# Patient Record
Sex: Female | Born: 1965
Health system: Southern US, Community
[De-identification: ages and names within clinical notes are randomized; demographics above are authoritative.]

## PROBLEM LIST (undated history)

## (undated) DIAGNOSIS — J189 Pneumonia, unspecified organism: Secondary | ICD-10-CM

## (undated) DIAGNOSIS — I10 Essential (primary) hypertension: Secondary | ICD-10-CM

## (undated) DIAGNOSIS — J449 Chronic obstructive pulmonary disease, unspecified: Secondary | ICD-10-CM

## (undated) DIAGNOSIS — E785 Hyperlipidemia, unspecified: Secondary | ICD-10-CM

## (undated) DIAGNOSIS — E119 Type 2 diabetes mellitus without complications: Secondary | ICD-10-CM

## (undated) DIAGNOSIS — I2510 Atherosclerotic heart disease of native coronary artery without angina pectoris: Secondary | ICD-10-CM

## (undated) DIAGNOSIS — K859 Acute pancreatitis without necrosis or infection, unspecified: Secondary | ICD-10-CM

## (undated) HISTORY — PX: TONSILLECTOMY: SUR1361

## (undated) HISTORY — DX: Pneumonia, unspecified organism: J18.9

## (undated) HISTORY — PX: ABDOMINAL HYSTERECTOMY: SHX81

## (undated) HISTORY — PX: OTHER SURGICAL HISTORY: SHX169

## (undated) HISTORY — DX: Type 2 diabetes mellitus without complications: E11.9

## (undated) HISTORY — DX: Essential (primary) hypertension: I10

## (undated) HISTORY — DX: Atherosclerotic heart disease of native coronary artery without angina pectoris: I25.10

## (undated) HISTORY — DX: Acute pancreatitis without necrosis or infection, unspecified: K85.90

## (undated) HISTORY — PX: ABDOMINAL HERNIA REPAIR: SHX539

## (undated) HISTORY — PX: KNEE SURGERY: SHX244

## (undated) HISTORY — PX: NOSE SURGERY: SHX723

---

## 2004-02-23 HISTORY — PX: CORONARY ANGIOPLASTY WITH STENT PLACEMENT: SHX49

## 2007-07-03 ENCOUNTER — Emergency Department (HOSPITAL_COMMUNITY): Admission: EM | Admit: 2007-07-03 | Discharge: 2007-07-04 | Payer: Self-pay | Admitting: Emergency Medicine

## 2007-07-21 ENCOUNTER — Emergency Department (HOSPITAL_COMMUNITY): Admission: EM | Admit: 2007-07-21 | Discharge: 2007-07-22 | Payer: Self-pay | Admitting: Emergency Medicine

## 2007-09-19 ENCOUNTER — Emergency Department (HOSPITAL_COMMUNITY): Admission: EM | Admit: 2007-09-19 | Discharge: 2007-09-19 | Payer: Self-pay | Admitting: Emergency Medicine

## 2007-09-20 ENCOUNTER — Emergency Department (HOSPITAL_COMMUNITY): Admission: EM | Admit: 2007-09-20 | Discharge: 2007-09-20 | Payer: Self-pay | Admitting: Emergency Medicine

## 2007-11-29 ENCOUNTER — Emergency Department (HOSPITAL_COMMUNITY): Admission: EM | Admit: 2007-11-29 | Discharge: 2007-11-29 | Payer: Self-pay | Admitting: Emergency Medicine

## 2008-01-09 ENCOUNTER — Ambulatory Visit (HOSPITAL_COMMUNITY): Admission: RE | Admit: 2008-01-09 | Discharge: 2008-01-09 | Payer: Self-pay | Admitting: Pediatrics

## 2008-01-12 ENCOUNTER — Ambulatory Visit: Payer: Self-pay | Admitting: Cardiology

## 2008-01-22 ENCOUNTER — Emergency Department (HOSPITAL_COMMUNITY): Admission: EM | Admit: 2008-01-22 | Discharge: 2008-01-22 | Payer: Self-pay | Admitting: Emergency Medicine

## 2008-04-24 ENCOUNTER — Encounter (INDEPENDENT_AMBULATORY_CARE_PROVIDER_SITE_OTHER): Payer: Self-pay | Admitting: *Deleted

## 2009-09-26 ENCOUNTER — Ambulatory Visit (HOSPITAL_COMMUNITY): Admission: RE | Admit: 2009-09-26 | Discharge: 2009-09-26 | Payer: Self-pay | Admitting: Gastroenterology

## 2009-11-07 ENCOUNTER — Emergency Department (HOSPITAL_COMMUNITY): Admission: EM | Admit: 2009-11-07 | Discharge: 2009-11-07 | Payer: Self-pay | Admitting: Emergency Medicine

## 2009-11-25 ENCOUNTER — Emergency Department (HOSPITAL_COMMUNITY): Admission: EM | Admit: 2009-11-25 | Discharge: 2009-11-26 | Payer: Self-pay | Admitting: Emergency Medicine

## 2009-12-25 ENCOUNTER — Ambulatory Visit: Payer: Self-pay | Admitting: Family Medicine

## 2010-03-15 ENCOUNTER — Encounter: Payer: Self-pay | Admitting: Cardiology

## 2010-05-07 LAB — COMPREHENSIVE METABOLIC PANEL
ALT: 18 U/L (ref 0–35)
AST: 25 U/L (ref 0–37)
Albumin: 3.5 g/dL (ref 3.5–5.2)
Alkaline Phosphatase: 62 U/L (ref 39–117)
BUN: 13 mg/dL (ref 6–23)
CO2: 31 mEq/L (ref 19–32)
Calcium: 9.3 mg/dL (ref 8.4–10.5)
Chloride: 101 mEq/L (ref 96–112)
Creatinine, Ser: 0.55 mg/dL (ref 0.4–1.2)
GFR calc Af Amer: >60 mL/min (ref >60)
GFR calc non Af Amer: >60 mL/min (ref >60)
Glucose, Bld: 108 mg/dL — ABNORMAL HIGH (ref 70–99)
Potassium: 3.6 mEq/L (ref 3.5–5.1)
Sodium: 139 mEq/L (ref 135–145)
Total Bilirubin: 0.3 mg/dL (ref 0.3–1.2)
Total Protein: 7 g/dL (ref 6.0–8.3)

## 2010-05-07 LAB — URINE MICROSCOPIC-ADD ON

## 2010-05-07 LAB — URINALYSIS, ROUTINE W REFLEX MICROSCOPIC
Bilirubin Urine: NEGATIVE
Glucose, UA: NEGATIVE mg/dL
Hgb urine dipstick: NEGATIVE
Ketones, ur: NEGATIVE mg/dL
Leukocytes, UA: NEGATIVE
Nitrite: NEGATIVE
Protein, ur: 100 mg/dL — AB
Specific Gravity, Urine: 1.025 (ref 1.005–1.030)
Urobilinogen, UA: 0.2 mg/dL (ref 0.0–1.0)
pH: 5.5 (ref 5.0–8.0)

## 2010-05-07 LAB — DIFFERENTIAL
Basophils Absolute: 0 10*3/uL (ref 0.0–0.1)
Basophils Relative: 0 % (ref 0–1)
Eosinophils Absolute: 0.2 10*3/uL (ref 0.0–0.7)
Eosinophils Relative: 1 % (ref 0–5)
Lymphocytes Relative: 18 % (ref 12–46)
Lymphs Abs: 2.9 10*3/uL (ref 0.7–4.0)
Monocytes Absolute: 1.1 10*3/uL — ABNORMAL HIGH (ref 0.1–1.0)
Monocytes Relative: 7 % (ref 3–12)
Neutro Abs: 11.4 10*3/uL — ABNORMAL HIGH (ref 1.7–7.7)
Neutrophils Relative %: 73 % (ref 43–77)

## 2010-05-07 LAB — CBC
HCT: 37.7 % (ref 36.0–46.0)
Hemoglobin: 12.8 g/dL (ref 12.0–15.0)
MCH: 29.8 pg (ref 26.0–34.0)
MCHC: 34 g/dL (ref 30.0–36.0)
MCV: 87.7 fL (ref 78.0–100.0)
Platelets: 320 10*3/uL (ref 150–400)
RBC: 4.3 MIL/uL (ref 3.87–5.11)
RDW: 14.6 % (ref 11.5–15.5)
WBC: 15.6 10*3/uL — ABNORMAL HIGH (ref 4.0–10.5)

## 2010-05-07 LAB — LIPASE, BLOOD: Lipase: 29 U/L (ref 11–59)

## 2010-05-08 LAB — GLUCOSE, CAPILLARY: Glucose-Capillary: 249 mg/dL — ABNORMAL HIGH (ref 70–99)

## 2010-07-07 NOTE — Letter (Signed)
January 12, 2008    Francoise Schaumann. Milford Cage DO, FAAP  [facility]  [address]  [place], [place] [address]   RE:  CATTALEYA, WIEN  MRN:  [id number]  /  DOB:  [birth year]   Dear Viviann Spare:   It was my pleasure consulting in the care of this nice woman who you  follow with multiple medical problems.  As you know, she reports stent  implantation in the right coronary artery in 2006 in [place].  She  subsequently did well until 2009, when she underwent catheterization for  recurrent chest discomfort.  The stent and all other vessels were  reportedly patent.  She continues to experience exertional chest  discomfort that has the characteristics of an angina.  There is  substernal pressure radiating to the left arm that is relieved with  rest.  Cardiovascular risk factors include obesity, insulin-requiring  diabetes, hypertension, and hyperlipidemia.   PAST SURGICAL HISTORY:  Includes a partial hysterectomy and a  tonsillectomy.   MEDICATIONS:  1. Clopidogrel 75 mg daily.  2. Simvastatin 40 mg daily.  3. Lisinopril 10 mg daily.  4. Furosemide 20 mg b.i.d.  5. Evista 1 tablet daily.  6. Januvia 100 mg daily.  7. NovoLog.  8. Lantus insulin to a total of 200 units per day or so.   ALLERGIES:  She reports inability to take ASPIRIN due to emesis.   SOCIAL HISTORY:  Quit smoking a few years ago after a 30 pack year total  consumption.  Works in home health care.  No excessive use of alcohol.  Separate with 4 children.   FAMILY HISTORY:  Father's cause of death is unknown.  Her mother died  with congestive heart failure and renal failure.  She has 4 siblings,  none of whom have coronary artery disease.   REVIEW OF SYSTEMS:  Notable for need for corrective lenses, occasional  palpitations, intermittent diarrhea, arthritic discomfort in multiple  regions, and occasional pedal edema.   PHYSICAL EXAMINATION:  GENERAL:  Overweight woman, in no acute distress.  The  height is 64 inches, weight 277.  VITAL SIGNS:  Blood pressure 120/70, heart rate 85 and regular, and  respirations 14.  HEENT:  EOMs full; normal lids and conjunctivae; normal oral mucosa.  NECK:  No jugular venous distention; normal carotid upstrokes with left  bruit.  ENDOCRINE:  No thyromegaly.  HEMATOPOIETIN:  No adenopathy.  LUNGS:  Clear.  CARDIAC:  Normal first and second heart sounds; minimal systolic murmur.  ABDOMEN:  Soft and nontender; no organomegaly; normal bowel sounds.  EXTREMITIES:  1-1/2+ ankle edema; distal pulses intact.  NEUROLOGIC:  Symmetric strength and tone; normal cranial nerves.   EKG:  Normal sinus rhythm; extreme right axis.   IMPRESSION:  Ms. Jiles Prows reports the onset of coronary artery disease  at a very young age.  Fortunately, her most recent catheterization  showed no significant obstructive lesions.  The symptoms she describes  could be syndrome X, angina without any apparent significant epicardial  coronary artery disease.  This can be treated with medications.  She  also has suboptimal control of risk factors, most notably her lipids.  We will increase simvastatin to 80 mg daily and fenofibrate.  We will  collect her records of previous cardiology care and plan to see this  nice woman again in 4 months.  If chest discomfort continues, the first  drug I would try is amlodipine.   Thank you so much for sending  this nice woman to see me.    Sincerely,      Gerrit Friends. [name], MD, [facility]  Electronically Signed    RMR/MedQ  DD: 01/12/2008  DT: 01/13/2008  Job #: [id number]

## 2010-08-13 ENCOUNTER — Encounter (HOSPITAL_BASED_OUTPATIENT_CLINIC_OR_DEPARTMENT_OTHER): Payer: Self-pay | Attending: Internal Medicine

## 2010-08-13 DIAGNOSIS — L97509 Non-pressure chronic ulcer of other part of unspecified foot with unspecified severity: Secondary | ICD-10-CM | POA: Insufficient documentation

## 2010-08-13 DIAGNOSIS — Z79899 Other long term (current) drug therapy: Secondary | ICD-10-CM | POA: Insufficient documentation

## 2010-08-13 DIAGNOSIS — E1165 Type 2 diabetes mellitus with hyperglycemia: Secondary | ICD-10-CM | POA: Insufficient documentation

## 2010-08-13 DIAGNOSIS — I10 Essential (primary) hypertension: Secondary | ICD-10-CM | POA: Insufficient documentation

## 2010-08-13 DIAGNOSIS — E785 Hyperlipidemia, unspecified: Secondary | ICD-10-CM | POA: Insufficient documentation

## 2010-08-13 DIAGNOSIS — IMO0002 Reserved for concepts with insufficient information to code with codable children: Secondary | ICD-10-CM | POA: Insufficient documentation

## 2010-08-13 DIAGNOSIS — I517 Cardiomegaly: Secondary | ICD-10-CM | POA: Insufficient documentation

## 2010-08-13 DIAGNOSIS — Z794 Long term (current) use of insulin: Secondary | ICD-10-CM | POA: Insufficient documentation

## 2010-08-14 ENCOUNTER — Other Ambulatory Visit (HOSPITAL_BASED_OUTPATIENT_CLINIC_OR_DEPARTMENT_OTHER): Payer: Self-pay | Admitting: Internal Medicine

## 2010-08-14 ENCOUNTER — Ambulatory Visit (HOSPITAL_COMMUNITY)
Admission: RE | Admit: 2010-08-14 | Discharge: 2010-08-14 | Disposition: A | Discharge status: Home / Self Care | Payer: Self-pay | Source: Ambulatory Visit | Attending: Internal Medicine | Admitting: Internal Medicine

## 2010-08-14 DIAGNOSIS — I517 Cardiomegaly: Secondary | ICD-10-CM | POA: Insufficient documentation

## 2010-08-14 DIAGNOSIS — E1169 Type 2 diabetes mellitus with other specified complication: Secondary | ICD-10-CM | POA: Insufficient documentation

## 2010-08-14 DIAGNOSIS — T148XXA Other injury of unspecified body region, initial encounter: Secondary | ICD-10-CM

## 2010-08-14 DIAGNOSIS — I10 Essential (primary) hypertension: Secondary | ICD-10-CM | POA: Insufficient documentation

## 2010-08-14 DIAGNOSIS — Z79899 Other long term (current) drug therapy: Secondary | ICD-10-CM | POA: Insufficient documentation

## 2010-08-14 DIAGNOSIS — Z794 Long term (current) use of insulin: Secondary | ICD-10-CM | POA: Insufficient documentation

## 2010-08-14 DIAGNOSIS — L97509 Non-pressure chronic ulcer of other part of unspecified foot with unspecified severity: Secondary | ICD-10-CM | POA: Insufficient documentation

## 2010-08-15 NOTE — Assessment & Plan Note (Unsigned)
[facility]  NAME:  Nicole [name], Nicole [name]             ACCOUNT NO.:  [id number]  MEDICAL RECORD NO.:  [id number]      DATE OF BIRTH:  04-03-1965  PHYSICIAN:  Leonie Man, M.D.    VISIT DATE:  08/14/2010                                  OFFICE VISIT   PROBLEMS:  Diabetic foot ulcer of the right plantar foot in this 45 year old female with uncontrolled insulin-dependent diabetes.  The ulcer has been present for the past several weeks.  This was noted after the patient went camping.  She does not recall injuring her foot.  She is not undergoing any specific treatment at this time.  ALLERGIES:  ASPIRIN which causes anaphylaxis.  She is also allergic to substances such as PINEAPPLES and BEE STINGS.  CURRENT MEDICATIONS:  Lisinopril, simvastatin, Lasix, NovoLog, Lantus insulin, and K-Lor.  PAST MEDICAL AND SURGICAL HISTORY:  Insulin-dependent diabetes, hyperlipidemia, hypertension, morbid obesity.  She is status post total abdominal hysterectomy.  She has had arthroscopic knee surgery, tonsillectomy, adenoidectomy, umbilical hernia.  She has had a cardiac stent placed and she has had C-sections on 3 separate occasions.  SOCIAL HISTORY:  Divorced, white English-speaking female.  No tobacco, alcohol, or recreational drug use.  REVIEW OF SYSTEMS:  Negative except as outlined above.  PHYSICAL EXAMINATION:  VITAL SIGNS:  Temperature 98.8, pulse 84, respirations 18, blood pressure 182/78.  Capillary glucose 179. HEAD AND NECK:  Shows head to be normocephalic.  Sclerae are anicteric. No nasal obstruction.  Oropharynx benign. NECK:  Supple.  No thyromegaly.  No palpable adenopathy. LUNGS:  Clear to auscultation. HEART:  Regular rate and rhythm without murmurs. ABDOMEN:  Soft, nontender, nondistended, but morbidly obese.  There are no hernias noted. EXTREMITIES:  Pulses are intact bilaterally.  ABI is 0.9.  She has a plantar ulcer measuring 0.5 x 0.3 x 0.5 with  tunneling at the 9 o'clock axis up to 0.5 cm.  The wound depth is probed and curetted today.  There is no probing to bone.  The wound is then packed with Aquacel.  The pressure is off- loaded with felt donut.  We will consider placing a total contact cast on her in within the next week.  In the meantime, we will continue her on doxycycline 100 mg b.i.d. pending cultures.     Leonie Man, M.D.     PB/MEDQ  D:  08/14/2010  T:  08/15/2010  Job:  [id number]

## 2010-08-28 ENCOUNTER — Encounter (HOSPITAL_BASED_OUTPATIENT_CLINIC_OR_DEPARTMENT_OTHER): Payer: Self-pay | Attending: Internal Medicine

## 2010-08-28 DIAGNOSIS — E1169 Type 2 diabetes mellitus with other specified complication: Secondary | ICD-10-CM | POA: Insufficient documentation

## 2010-08-28 DIAGNOSIS — I10 Essential (primary) hypertension: Secondary | ICD-10-CM | POA: Insufficient documentation

## 2010-08-28 DIAGNOSIS — I517 Cardiomegaly: Secondary | ICD-10-CM | POA: Insufficient documentation

## 2010-08-28 DIAGNOSIS — Z79899 Other long term (current) drug therapy: Secondary | ICD-10-CM | POA: Insufficient documentation

## 2010-08-28 DIAGNOSIS — E1165 Type 2 diabetes mellitus with hyperglycemia: Secondary | ICD-10-CM | POA: Insufficient documentation

## 2010-08-28 DIAGNOSIS — E785 Hyperlipidemia, unspecified: Secondary | ICD-10-CM | POA: Insufficient documentation

## 2010-08-28 DIAGNOSIS — L97509 Non-pressure chronic ulcer of other part of unspecified foot with unspecified severity: Secondary | ICD-10-CM | POA: Insufficient documentation

## 2010-08-28 DIAGNOSIS — Z794 Long term (current) use of insulin: Secondary | ICD-10-CM | POA: Insufficient documentation

## 2010-08-28 DIAGNOSIS — IMO0002 Reserved for concepts with insufficient information to code with codable children: Secondary | ICD-10-CM | POA: Insufficient documentation

## 2010-09-25 ENCOUNTER — Encounter (HOSPITAL_BASED_OUTPATIENT_CLINIC_OR_DEPARTMENT_OTHER): Payer: Self-pay | Attending: General Surgery

## 2010-09-25 DIAGNOSIS — E1169 Type 2 diabetes mellitus with other specified complication: Secondary | ICD-10-CM | POA: Insufficient documentation

## 2010-09-25 DIAGNOSIS — Z794 Long term (current) use of insulin: Secondary | ICD-10-CM | POA: Insufficient documentation

## 2010-09-25 DIAGNOSIS — Z8614 Personal history of Methicillin resistant Staphylococcus aureus infection: Secondary | ICD-10-CM | POA: Insufficient documentation

## 2010-09-25 DIAGNOSIS — Z79899 Other long term (current) drug therapy: Secondary | ICD-10-CM | POA: Insufficient documentation

## 2010-09-25 DIAGNOSIS — L97509 Non-pressure chronic ulcer of other part of unspecified foot with unspecified severity: Secondary | ICD-10-CM | POA: Insufficient documentation

## 2010-09-25 DIAGNOSIS — I10 Essential (primary) hypertension: Secondary | ICD-10-CM | POA: Insufficient documentation

## 2010-10-30 ENCOUNTER — Encounter (HOSPITAL_BASED_OUTPATIENT_CLINIC_OR_DEPARTMENT_OTHER): Payer: Self-pay

## 2010-12-30 ENCOUNTER — Other Ambulatory Visit (HOSPITAL_COMMUNITY): Payer: Self-pay | Admitting: Physician Assistant

## 2010-12-30 DIAGNOSIS — Z139 Encounter for screening, unspecified: Secondary | ICD-10-CM

## 2011-01-05 ENCOUNTER — Ambulatory Visit (HOSPITAL_COMMUNITY): Payer: Self-pay

## 2011-01-11 ENCOUNTER — Ambulatory Visit (HOSPITAL_COMMUNITY): Payer: Self-pay

## 2011-02-18 ENCOUNTER — Encounter: Payer: Self-pay | Admitting: *Deleted

## 2011-02-18 ENCOUNTER — Inpatient Hospital Stay (HOSPITAL_COMMUNITY)
Admission: EM | Admit: 2011-02-18 | Discharge: 2011-02-20 | DRG: 193 | Disposition: A | Discharge status: Home / Self Care | Payer: Self-pay | Attending: Internal Medicine | Admitting: Internal Medicine

## 2011-02-18 ENCOUNTER — Emergency Department (HOSPITAL_COMMUNITY): Payer: Self-pay

## 2011-02-18 ENCOUNTER — Emergency Department (HOSPITAL_COMMUNITY): Admission: EM | Admit: 2011-02-18 | Discharge: 2011-02-18 | Discharge status: Against Medical Advice | Payer: Self-pay

## 2011-02-18 DIAGNOSIS — Z9861 Coronary angioplasty status: Secondary | ICD-10-CM

## 2011-02-18 DIAGNOSIS — J189 Pneumonia, unspecified organism: Secondary | ICD-10-CM | POA: Diagnosis present

## 2011-02-18 DIAGNOSIS — J9600 Acute respiratory failure, unspecified whether with hypoxia or hypercapnia: Secondary | ICD-10-CM

## 2011-02-18 DIAGNOSIS — J441 Chronic obstructive pulmonary disease with (acute) exacerbation: Secondary | ICD-10-CM | POA: Diagnosis present

## 2011-02-18 DIAGNOSIS — Z6841 Body Mass Index (BMI) 40.0 and over, adult: Secondary | ICD-10-CM

## 2011-02-18 DIAGNOSIS — E785 Hyperlipidemia, unspecified: Secondary | ICD-10-CM | POA: Diagnosis present

## 2011-02-18 DIAGNOSIS — Z87891 Personal history of nicotine dependence: Secondary | ICD-10-CM

## 2011-02-18 DIAGNOSIS — IMO0001 Reserved for inherently not codable concepts without codable children: Secondary | ICD-10-CM | POA: Diagnosis present

## 2011-02-18 DIAGNOSIS — I10 Essential (primary) hypertension: Secondary | ICD-10-CM | POA: Diagnosis present

## 2011-02-18 DIAGNOSIS — J13 Pneumonia due to Streptococcus pneumoniae: Principal | ICD-10-CM | POA: Diagnosis present

## 2011-02-18 DIAGNOSIS — E1169 Type 2 diabetes mellitus with other specified complication: Secondary | ICD-10-CM

## 2011-02-18 DIAGNOSIS — I2510 Atherosclerotic heart disease of native coronary artery without angina pectoris: Secondary | ICD-10-CM | POA: Diagnosis present

## 2011-02-18 DIAGNOSIS — E669 Obesity, unspecified: Secondary | ICD-10-CM | POA: Diagnosis present

## 2011-02-18 DIAGNOSIS — I25119 Atherosclerotic heart disease of native coronary artery with unspecified angina pectoris: Secondary | ICD-10-CM | POA: Diagnosis present

## 2011-02-18 DIAGNOSIS — I509 Heart failure, unspecified: Secondary | ICD-10-CM

## 2011-02-18 HISTORY — DX: Hyperlipidemia, unspecified: E78.5

## 2011-02-18 HISTORY — DX: Morbid (severe) obesity due to excess calories: E66.01

## 2011-02-18 HISTORY — DX: Chronic obstructive pulmonary disease, unspecified: J44.9

## 2011-02-18 LAB — GLUCOSE, CAPILLARY: Glucose-Capillary: 127 mg/dL — ABNORMAL HIGH (ref 70–99)

## 2011-02-18 MED ORDER — LEVOFLOXACIN IN D5W 500 MG/100ML IV SOLN
500.0000 mg | Freq: Once | INTRAVENOUS | Status: AC
  Administered 2011-02-19: 500 mg via INTRAVENOUS
  Filled 2011-02-18: qty 100

## 2011-02-18 MED ORDER — SODIUM CHLORIDE 0.9 % IV SOLN
INTRAVENOUS | Status: DC
  Administered 2011-02-19: via INTRAVENOUS

## 2011-02-18 MED ORDER — FUROSEMIDE 10 MG/ML IJ SOLN
40.0000 mg | Freq: Once | INTRAMUSCULAR | Status: AC
  Administered 2011-02-19: 40 mg via INTRAVENOUS
  Filled 2011-02-18: qty 4

## 2011-02-18 MED ORDER — ALBUTEROL SULFATE (5 MG/ML) 0.5% IN NEBU
5.0000 mg | INHALATION_SOLUTION | Freq: Once | RESPIRATORY_TRACT | Status: AC
  Administered 2011-02-19: 5 mg via RESPIRATORY_TRACT
  Filled 2011-02-18: qty 1

## 2011-02-18 NOTE — ED Notes (Signed)
Earache, shortness of breath, diarrhea, nausea and vomitng, fever

## 2011-02-19 ENCOUNTER — Encounter (HOSPITAL_COMMUNITY): Payer: Self-pay | Admitting: Internal Medicine

## 2011-02-19 DIAGNOSIS — E785 Hyperlipidemia, unspecified: Secondary | ICD-10-CM | POA: Diagnosis present

## 2011-02-19 DIAGNOSIS — I10 Essential (primary) hypertension: Secondary | ICD-10-CM | POA: Diagnosis present

## 2011-02-19 DIAGNOSIS — J441 Chronic obstructive pulmonary disease with (acute) exacerbation: Secondary | ICD-10-CM | POA: Diagnosis present

## 2011-02-19 DIAGNOSIS — J189 Pneumonia, unspecified organism: Secondary | ICD-10-CM | POA: Diagnosis present

## 2011-02-19 DIAGNOSIS — J9600 Acute respiratory failure, unspecified whether with hypoxia or hypercapnia: Secondary | ICD-10-CM

## 2011-02-19 DIAGNOSIS — I2510 Atherosclerotic heart disease of native coronary artery without angina pectoris: Secondary | ICD-10-CM | POA: Diagnosis present

## 2011-02-19 DIAGNOSIS — E669 Obesity, unspecified: Secondary | ICD-10-CM | POA: Diagnosis present

## 2011-02-19 DIAGNOSIS — I25119 Atherosclerotic heart disease of native coronary artery with unspecified angina pectoris: Secondary | ICD-10-CM | POA: Diagnosis present

## 2011-02-19 DIAGNOSIS — E1169 Type 2 diabetes mellitus with other specified complication: Secondary | ICD-10-CM | POA: Diagnosis present

## 2011-02-19 DIAGNOSIS — J984 Other disorders of lung: Secondary | ICD-10-CM

## 2011-02-19 LAB — BLOOD GAS, ARTERIAL
Bicarbonate: 25 mEq/L — ABNORMAL HIGH (ref 20.0–24.0)
Drawn by: 22223
O2 Content: 2 L/min
TCO2: 20.3 mmol/L (ref 0–100)
pCO2 arterial: 35.7 mmHg (ref 35.0–45.0)
pH, Arterial: 7.459 — ABNORMAL HIGH (ref 7.350–7.400)
pO2, Arterial: 75.6 mmHg — ABNORMAL LOW (ref 80.0–100.0)

## 2011-02-19 LAB — BASIC METABOLIC PANEL
BUN: 28 mg/dL — ABNORMAL HIGH (ref 6–23)
CO2: 28 mEq/L (ref 19–32)
CO2: 27 mEq/L (ref 19–32)
Calcium: 9 mg/dL (ref 8.4–10.5)
Calcium: 8.8 mg/dL (ref 8.4–10.5)
Chloride: 93 mEq/L — ABNORMAL LOW (ref 96–112)
Glucose, Bld: 132 mg/dL — ABNORMAL HIGH (ref 70–99)
Glucose, Bld: 156 mg/dL — ABNORMAL HIGH (ref 70–99)
Potassium: 3.4 mEq/L — ABNORMAL LOW (ref 3.5–5.1)
Sodium: 133 mEq/L — ABNORMAL LOW (ref 135–145)
Sodium: 133 mEq/L — ABNORMAL LOW (ref 135–145)

## 2011-02-19 LAB — STREP PNEUMONIAE URINARY ANTIGEN: Strep Pneumo Urinary Antigen: POSITIVE — AB

## 2011-02-19 LAB — CBC
HCT: 33.9 % — ABNORMAL LOW (ref 36.0–46.0)
HCT: 34.2 % — ABNORMAL LOW (ref 36.0–46.0)
Hemoglobin: 11.3 g/dL — ABNORMAL LOW (ref 12.0–15.0)
Hemoglobin: 11.4 g/dL — ABNORMAL LOW (ref 12.0–15.0)
MCH: 30 pg (ref 26.0–34.0)
MCH: 29.9 pg (ref 26.0–34.0)
MCHC: 33.3 g/dL (ref 30.0–36.0)
MCV: 89.9 fL (ref 78.0–100.0)
MCV: 89.8 fL (ref 78.0–100.0)
RBC: 3.77 MIL/uL — ABNORMAL LOW (ref 3.87–5.11)
RBC: 3.81 MIL/uL — ABNORMAL LOW (ref 3.87–5.11)
WBC: 21.6 10*3/uL — ABNORMAL HIGH (ref 4.0–10.5)

## 2011-02-19 LAB — PRO B NATRIURETIC PEPTIDE
Pro B Natriuretic peptide (BNP): 173.7 pg/mL — ABNORMAL HIGH (ref 0–125)
Pro B Natriuretic peptide (BNP): 130.6 pg/mL — ABNORMAL HIGH (ref 0–125)

## 2011-02-19 LAB — EXPECTORATED SPUTUM ASSESSMENT W GRAM STAIN, RFLX TO RESP C

## 2011-02-19 LAB — URINE MICROSCOPIC-ADD ON

## 2011-02-19 LAB — URINALYSIS, ROUTINE W REFLEX MICROSCOPIC
Glucose, UA: NEGATIVE mg/dL
Hgb urine dipstick: NEGATIVE
Protein, ur: 100 mg/dL — AB

## 2011-02-19 LAB — HIV ANTIBODY (ROUTINE TESTING W REFLEX): HIV: NONREACTIVE

## 2011-02-19 LAB — GLUCOSE, CAPILLARY
Glucose-Capillary: 293 mg/dL — ABNORMAL HIGH (ref 70–99)
Glucose-Capillary: 338 mg/dL — ABNORMAL HIGH (ref 70–99)

## 2011-02-19 LAB — INFLUENZA PANEL BY PCR (TYPE A & B): Influenza A By PCR: NEGATIVE

## 2011-02-19 MED ORDER — INSULIN ASPART 100 UNIT/ML ~~LOC~~ SOLN
0.0000 [IU] | Freq: Every day | SUBCUTANEOUS | Status: DC
  Administered 2011-02-19: 4 [IU] via SUBCUTANEOUS

## 2011-02-19 MED ORDER — ALBUTEROL SULFATE (5 MG/ML) 0.5% IN NEBU
2.5000 mg | INHALATION_SOLUTION | RESPIRATORY_TRACT | Status: DC
  Administered 2011-02-19 – 2011-02-20 (×6): 2.5 mg via RESPIRATORY_TRACT
  Filled 2011-02-19 (×7): qty 0.5

## 2011-02-19 MED ORDER — ROSUVASTATIN CALCIUM 5 MG PO TABS
20.0000 mg | ORAL_TABLET | Freq: Every day | ORAL | Status: DC
  Administered 2011-02-19: 20 mg via ORAL
  Filled 2011-02-19: qty 4

## 2011-02-19 MED ORDER — IPRATROPIUM BROMIDE 0.02 % IN SOLN
0.5000 mg | RESPIRATORY_TRACT | Status: DC
  Administered 2011-02-19 – 2011-02-20 (×6): 0.5 mg via RESPIRATORY_TRACT
  Filled 2011-02-19 (×7): qty 2.5

## 2011-02-19 MED ORDER — FAMOTIDINE 20 MG PO TABS
20.0000 mg | ORAL_TABLET | Freq: Two times a day (BID) | ORAL | Status: DC
  Administered 2011-02-19 – 2011-02-20 (×3): 20 mg via ORAL
  Filled 2011-02-19 (×3): qty 1

## 2011-02-19 MED ORDER — INSULIN GLARGINE 100 UNIT/ML ~~LOC~~ SOLN
65.0000 [IU] | SUBCUTANEOUS | Status: DC
  Administered 2011-02-19 – 2011-02-20 (×2): 65 [IU] via SUBCUTANEOUS
  Filled 2011-02-19: qty 3

## 2011-02-19 MED ORDER — ONDANSETRON HCL 4 MG/2ML IJ SOLN
4.0000 mg | Freq: Four times a day (QID) | INTRAMUSCULAR | Status: DC | PRN
  Administered 2011-02-20 (×2): 4 mg via INTRAVENOUS
  Filled 2011-02-19 (×2): qty 2

## 2011-02-19 MED ORDER — LEVOFLOXACIN IN D5W 750 MG/150ML IV SOLN
INTRAVENOUS | Status: AC
  Filled 2011-02-19: qty 150

## 2011-02-19 MED ORDER — CLOPIDOGREL BISULFATE 75 MG PO TABS
75.0000 mg | ORAL_TABLET | Freq: Every day | ORAL | Status: DC
  Administered 2011-02-19 – 2011-02-20 (×2): 75 mg via ORAL
  Filled 2011-02-19 (×2): qty 1

## 2011-02-19 MED ORDER — SENNOSIDES-DOCUSATE SODIUM 8.6-50 MG PO TABS: 2.0000 | ORAL_TABLET | Freq: Every evening | ORAL | Status: DC | PRN

## 2011-02-19 MED ORDER — INSULIN GLARGINE 100 UNIT/ML ~~LOC~~ SOLN
70.0000 [IU] | Freq: Every day | SUBCUTANEOUS | Status: DC
  Administered 2011-02-19: 70 [IU] via SUBCUTANEOUS

## 2011-02-19 MED ORDER — FUROSEMIDE 40 MG PO TABS
40.0000 mg | ORAL_TABLET | Freq: Every day | ORAL | Status: DC
  Administered 2011-02-19 – 2011-02-20 (×2): 40 mg via ORAL
  Filled 2011-02-19 (×2): qty 1

## 2011-02-19 MED ORDER — DEXTROSE 5 % IV SOLN
1.0000 g | INTRAVENOUS | Status: DC
  Administered 2011-02-19 – 2011-02-20 (×2): 1 g via INTRAVENOUS
  Filled 2011-02-19 (×4): qty 10

## 2011-02-19 MED ORDER — GUAIFENESIN ER 600 MG PO TB12
1200.0000 mg | ORAL_TABLET | Freq: Two times a day (BID) | ORAL | Status: DC
  Administered 2011-02-19 – 2011-02-20 (×3): 1200 mg via ORAL
  Filled 2011-02-19 (×3): qty 2

## 2011-02-19 MED ORDER — LEVOFLOXACIN IN D5W 750 MG/150ML IV SOLN
750.0000 mg | INTRAVENOUS | Status: DC
  Administered 2011-02-19 – 2011-02-20 (×2): 750 mg via INTRAVENOUS
  Filled 2011-02-19 (×4): qty 150

## 2011-02-19 MED ORDER — ENOXAPARIN SODIUM 40 MG/0.4ML ~~LOC~~ SOLN
40.0000 mg | Freq: Every day | SUBCUTANEOUS | Status: DC
  Administered 2011-02-19 – 2011-02-20 (×2): 40 mg via SUBCUTANEOUS
  Filled 2011-02-19 (×3): qty 0.4

## 2011-02-19 MED ORDER — OMEGA-3-ACID ETHYL ESTERS 1 G PO CAPS
2.0000 g | ORAL_CAPSULE | Freq: Two times a day (BID) | ORAL | Status: DC
  Administered 2011-02-19 – 2011-02-20 (×3): 2 g via ORAL
  Filled 2011-02-19 (×2): qty 2
  Filled 2011-02-19: qty 1

## 2011-02-19 MED ORDER — INSULIN ASPART 100 UNIT/ML ~~LOC~~ SOLN
0.0000 [IU] | Freq: Three times a day (TID) | SUBCUTANEOUS | Status: DC
  Administered 2011-02-19 (×2): 3 [IU] via SUBCUTANEOUS
  Administered 2011-02-19 – 2011-02-20 (×2): 8 [IU] via SUBCUTANEOUS
  Filled 2011-02-19: qty 3

## 2011-02-19 MED ORDER — ONDANSETRON 8 MG PO TBDP
8.0000 mg | ORAL_TABLET | Freq: Once | ORAL | Status: AC
Start: 2011-02-19 — End: 2011-02-19
  Administered 2011-02-19: 8 mg via ORAL
  Filled 2011-02-19: qty 1

## 2011-02-19 MED ORDER — DEXTROSE 5 % IV SOLN
INTRAVENOUS | Status: AC
  Filled 2011-02-19: qty 10

## 2011-02-19 NOTE — Progress Notes (Signed)
*  PRELIMINARY RESULTS* Echocardiogram 2D Echocardiogram has been performed.  Conrad [place] 02/19/2011, 9:59 AM

## 2011-02-19 NOTE — ED Provider Notes (Signed)
History     CSN: [id number]  Arrival date & time 02/18/11  2056   First MD Initiated Contact with Patient 02/18/11 2341      Chief Complaint  Patient presents with  . Cough    (Consider location/radiation/quality/duration/timing/severity/associated sxs/prior treatment) HPI  No past medical history on file.  No past surgical history on file.  No family history on file.  History  Substance Use Topics  . Smoking status: Not on file  . Smokeless tobacco: Not on file  . Alcohol Use: Not on file    OB History    Grav Para Term Preterm Abortions TAB SAB Ect Mult Living                  Review of Systems  Allergies  Aspirin; E-mycin; and Tetracyclines & related  Home Medications   Current Outpatient Rx  Name Route Sig Dispense Refill  . LEVETIRACETAM 500 MG PO TABS Oral Take 500 mg by mouth every 12 (twelve) hours.        BP 114/47  Pulse 94  Temp(Src) 98.4 F (36.9 C) (Oral)  Resp 22  Ht 5\' 3"  (1.6 m)  Wt 300 lb (136.079 kg)  BMI 53.14 kg/m2  SpO2 98%  Physical Exam  ED Course  CRITICAL CARE Performed by: Kathie Dike Authorized by: Kathie Dike Total critical care time: 30 minutes Critical care was necessary to treat or prevent imminent or life-threatening deterioration of the following conditions: pneumonia and CHF with hypoxia. Critical care was time spent personally by me on the following activities: development of treatment plan with patient or surrogate, discussions with consultants, evaluation of patient's response to treatment, examination of patient, obtaining history from patient or surrogate, ordering and performing treatments and interventions, ordering and review of laboratory studies, ordering and review of radiographic studies, pulse oximetry and re-evaluation of patient's condition.   (including critical care time)  Labs Reviewed  GLUCOSE, CAPILLARY - Abnormal; Notable for the following:    Glucose-Capillary 127 (*)    All  other components within normal limits  BLOOD GAS, ARTERIAL - Abnormal; Notable for the following:    pH, Arterial 7.459 (*)    pO2, Arterial 75.6 (*)    Bicarbonate 25.0 (*)    All other components within normal limits  PRO B NATRIURETIC PEPTIDE - Abnormal; Notable for the following:    Pro B Natriuretic peptide (BNP) 173.7 (*)    All other components within normal limits  CARDIAC PANEL(CRET KIN+CKTOT+MB+TROPI) - Abnormal; Notable for the following:    Total CK 251 (*)    All other components within normal limits  CBC - Abnormal; Notable for the following:    WBC 25.3 (*) WHITE COUNT CONFIRMED ON SMEAR   RBC 3.77 (*)    Hemoglobin 11.3 (*)    HCT 33.9 (*)    All other components within normal limits  BASIC METABOLIC PANEL - Abnormal; Notable for the following:    Sodium 133 (*)    Chloride 92 (*)    Glucose, Bld 132 (*)    BUN 28 (*)    GFR calc non Af Amer 68 (*)    GFR calc Af Amer 79 (*)    All other components within normal limits  URINALYSIS, ROUTINE W REFLEX MICROSCOPIC - Abnormal; Notable for the following:    Color, Urine AMBER (*) BIOCHEMICALS MAY BE AFFECTED BY COLOR   Bilirubin Urine SMALL (*)    Protein, ur 100 (*)  All other components within normal limits  URINE MICROSCOPIC-ADD ON - Abnormal; Notable for the following:    Squamous Epithelial / LPF MANY (*)    Bacteria, UA FEW (*)    All other components within normal limits   Dg Chest 2 View  02/18/2011  *RADIOLOGY REPORT*  Clinical Data: Fever, cough, congestion and shortness of breath.  CHEST - 2 VIEW  Comparison: Chest radiograph performed 08/14/2010  Findings: The lungs are well-aerated.  Vascular congestion is noted, with bilateral central and bibasilar airspace opacities. This may reflect multifocal pneumonia or possibly edema.  There is no evidence of pleural effusion or pneumothorax.  The heart is borderline enlarged; the mediastinal contour is within normal limits.  No acute osseous abnormalities are  seen.  IMPRESSION: Vascular congestion and borderline cardiomegaly, with bilateral central and bibasilar airspace opacities.  This may reflect multifocal pneumonia or possibly edema.  Original Report Authenticated By: Tonia Ghent, M.D.     Dx: 1. Pneumonia 2. Hypoxia   MDM  I have reviewed nursing notes, vital signs, and all appropriate lab and imaging results for this patient. Patient presented with shortness of breath using accessory muscles. Patient also noted to have rales and rhonchi and wheezes on examination slow to respond to conservative management. Hypoxia noted on arterial blood gas. Pneumonia and suspected congestive failure on chest x-ray. Patient improving after IV Lasix IV antibiotic and C Pap. Patient noted to have significantly elevated white blood cell count. Admission discussed with Dr. Orvan Falconer. Patient seen with me by Dr. Read Drivers. Patient to be admitted to telemetry.       Kathie Dike, [place] 02/19/11 [phone]

## 2011-02-19 NOTE — ED Provider Notes (Signed)
Medical screening examination/treatment/procedure(s) were conducted as a shared visit with non-physician practitioner(s) and myself.  I personally evaluated the patient during the encounter  1:44 AM Patient off BiPAP. Tachypnea but in no distress. Rales in bases bilaterally patient advised of plan for admission.  Hanley Seamen, MD 02/19/11 [phone]

## 2011-02-19 NOTE — Plan of Care (Signed)
Problem: Food- and Nutrition-Related Knowledge Deficit (NB-1.1) Goal: Nutrition education Formal process to instruct or train a patient/client in a skill or to impart knowledge to help patients/clients voluntarily manage or modify food choices and eating behavior to maintain or improve health.  Outcome: Adequate for Discharge Pt provided with Basics of CHO Modified diet guideline. She reports DM dx >20 yr yet is unable to demonstrate correct CHO intake goals. We reviewed consistent carbohydrate intake and label reading and she was provided handouts CHO Counting and Nutrition In The Fast Lane and Tips for Weight Loss. Rec she follow-up for outpatient nutr counseling after d/c for weight loss goals.

## 2011-02-19 NOTE — Progress Notes (Signed)
Subjective:  This 45 year old morbidly obese lady was admitted with symptoms of myalgia, fever, cough productive of yellow sputum and dyspnea. She appears to have a chest infection and, in fact, she is improved today compared to yesterday. Her cough is still productive of yellow sputum. Her echocardiogram did not give good results do to poor quality, likely related to her obesity. Dr. [name], cardiologist who read the echocardiogram, feels that her ejection fraction is probably okay.           Physical Exam: Blood pressure 153/69, pulse 94, temperature 98.3 F (36.8 C), temperature source Oral, resp. rate 24, height 5\' 3"  (1.6 m), weight 136.079 kg (300 lb), SpO2 97.00%. She does look systemically well. There is no increased work of breathing. Lung fields are entirely clear without any evidence of wheezing, crackles or bronchial breathing. She is clinically not in heart failure. Heart sounds are present and normal. Jugular venous pressure not raised. There is no peripheral pitting edema in her legs. She is alert and orientated.   Investigations:  Recent Results (from the past 240 hour(s))  CULTURE, BLOOD (ROUTINE X 2)     Status: Normal (Preliminary result)   Collection Time   02/19/11  5:11 AM      Component Value Range Status Comment   Specimen Description BLOOD RIGHT ANTECUBITAL   Final    Special Requests BOTTLES DRAWN AEROBIC AND ANAEROBIC  2 CC EACH   Final    Culture PENDING   Incomplete    Report Status PENDING   Incomplete   CULTURE, BLOOD (ROUTINE X 2)     Status: Normal (Preliminary result)   Collection Time   02/19/11  5:35 AM      Component Value Range Status Comment   Specimen Description BLOOD RIGHT HAND   Final    Special Requests BOTTLES DRAWN AEROBIC AND ANAEROBIC  2 CC EACH   Final    Culture PENDING   Incomplete    Report Status PENDING   Incomplete   CULTURE, SPUTUM-ASSESSMENT     Status: Normal (Preliminary result)   Collection Time   02/19/11  5:37 AM        Component Value Range Status Comment   Specimen Description SPU   Final    Special Requests NONE   Final    Sputum evaluation     Final    Value: THIS SPECIMEN IS ACCEPTABLE. RESPIRATORY CULTURE REPORT TO FOLLOW.   Report Status PENDING   Incomplete      Basic Metabolic Panel:  [facility] 02/19/11 0512 02/18/11 2343  NA 133* 133*  K 3.4* 3.5  CL 93* 92*  CO2 27 28  GLUCOSE 156* 132*  BUN 32* 28*  CREATININE 1.03 0.98  CALCIUM 8.8 9.0  MG -- --  PHOS -- --       CBC:  [facility] 02/19/11 0512 02/18/11 2343  WBC 21.6* 25.3*  NEUTROABS -- --  HGB 11.4* 11.3*  HCT 34.2* 33.9*  MCV 89.8 89.9  PLT 248 254    Dg Chest 2 View  02/18/2011  *RADIOLOGY REPORT*  Clinical Data: Fever, cough, congestion and shortness of breath.  CHEST - 2 VIEW  Comparison: Chest radiograph performed 08/14/2010  Findings: The lungs are well-aerated.  Vascular congestion is noted, with bilateral central and bibasilar airspace opacities. This may reflect multifocal pneumonia or possibly edema.  There is no evidence of pleural effusion or pneumothorax.  The heart is borderline enlarged; the mediastinal contour is within normal limits.  No  acute osseous abnormalities are seen.  IMPRESSION: Vascular congestion and borderline cardiomegaly, with bilateral central and bibasilar airspace opacities.  This may reflect multifocal pneumonia or possibly edema.  Original Report Authenticated By: Tonia Ghent, M.D.      Medications: I have reviewed the patient's current medications.  Impression: 1. Community-acquired pneumonia, clinically improving. 2. Morbid obesity. 3. Diabetes mellitus. 4. Hypertension. 5. Possible influenza based on patient's clinical symptoms.     Plan: 1. Continue with current intravenous antibiotics. 2. Await influenza test. 3. Possible discharge home tomorrow if she continues to feel well.     LOS: 1 day   [name] Pager [phone]  02/19/2011, 11:20 AM

## 2011-02-19 NOTE — ED Notes (Signed)
Pt complained of nausea while on bipap.  Bipap removed to prevent aspiration of emesis and pt placed on 2 L [place].  Pt denies SOB at this time.  Ivery Quale, PA notified and new orders were given.

## 2011-02-19 NOTE — H&P (Signed)
PCP:  Goes to the [facility]  Chief Complaint:  Progressive shortness of breath for 3-4 days  HPI: Nicole [name] is an 45 y.o. obese Caucasian female.  Works as a Water engineer, diabetes hypertension coronary artery disease status post stenting morbid obesity; history of asthma in childhood; heavy tobacco abuse but discontinued about 6 years ago.  Having cough runny nose difficulty breathing getting worse over the past 3-4 days until he came to the emergency room where she was found to be hypoxic, S. x-ray showed a bilateral lung disease questionable pneumonia questionable heart failure. Denies a history of heart failure; she does take Lasix chronically for leg edema;  Sputum has been white to occasionally bloody she started having loose stools today, and for the past few days has been having lightheadedness with standing.   she has been having progressive weight gain which she attributes to the use of high doses of insulin. She has no recollection of ever being assessed for her Cushing's disease.  She has not been using steroids.  He was prescribed a nebulizers but uses her husband's home nebulizer she does not have her own nebulizer.  Rewiew of Systems:  The patient denies anorexia, , weight loss,, vision loss, decreased hearing, hoarseness, chest pain, syncope, , balance deficits, hemoptysis, abdominal pain, melena, hematochezia, severe indigestion/heartburn, hematuria, incontinence, genital sores, muscle weakness, suspicious skin lesions, transient blindness, difficulty walking, depression, unusual weight change, abnormal bleeding, enlarged lymph nodes, angioedema, and breast masses.    Past Medical History  Diagnosis Date  . HTN (hypertension), benign   . Diabetes mellitus type 2 in obese   . Morbid obesity   . Dyslipidemia   . CAD (coronary artery disease)     s/p stenting  . Asthma   . COPD (chronic obstructive pulmonary disease)     No past surgical history  on file.  Medications:  HOME MEDS: Prior to Admission medications   Medication Sig Start Date End Date Taking? Authorizing Provider  albuterol (2.5 MG/3ML) 0.083% NEBU 3 mL, albuterol (5 MG/ML) 0.5% NEBU 0.5 mL Inhale into the lungs.     Yes Historical Provider, MD  albuterol (PROVENTIL) (5 MG/ML) 0.5% nebulizer solution Take 2.5 mg by nebulization as needed.     Yes Historical Provider, MD  albuterol (VENTOLIN HFA) 108 (90 BASE) MCG/ACT inhaler Inhale 2 puffs into the lungs as needed.     Yes Historical Provider, MD  clopidogrel (PLAVIX) 75 MG tablet Take 75 mg by mouth daily.     Yes Historical Provider, MD  fish oil-omega-3 fatty acids 1000 MG capsule Take 2 g by mouth 2 (two) times daily.     Yes Historical Provider, MD  furosemide (LASIX) 40 MG tablet Take 40 mg by mouth daily.     Yes Historical Provider, MD  insulin aspart (NOVOLOG) 100 UNIT/ML injection Inject 50 Units into the skin 3 (three) times daily before meals.     Yes Historical Provider, MD  insulin glargine (LANTUS) 100 UNIT/ML injection Inject 70 Units into the skin at bedtime.     Yes Historical Provider, MD  insulin glargine (LANTUS) 100 UNIT/ML injection Inject 65 Units into the skin every morning.     Yes Historical Provider, MD  simvastatin (ZOCOR) 40 MG tablet Take 80 mg by mouth at bedtime.     Yes Historical Provider, MD     Allergies:  Allergies  Allergen Reactions  . Aspirin   . E-Mycin (Erythromycin Base)   . Tetracyclines &  Related     Social History:   reports that she quit smoking about 6 years ago. Her smoking use included Cigarettes. She has a 30 pack-year smoking history. She does not have any smokeless tobacco history on file. Her alcohol and drug histories not on file.  Family History: Family History  Problem Relation Age of Onset  . Diabetes Mother   . Heart failure Mother   . Hypertension Mother   . Kidney failure Mother      Physical Exam: Filed Vitals:   02/19/11 0055 02/19/11 0059  02/19/11 0138 02/19/11 0213  BP:   114/47 104/44  Pulse:    101  Temp:   98.4 F (36.9 C)   TempSrc:   Oral   Resp:      Height:      Weight:      SpO2: 94% 94% 98% 92%   Blood pressure 104/44, pulse 101, temperature 98.4 F (36.9 C), temperature source Oral, resp. rate 22, height 5\' 3"  (1.6 m), weight 136.079 kg (300 lb), SpO2 92.00%.  GEN: Morbidly obese middle-age Caucasian lady sitting up in the stretcher itachypneic; cooperative with exam PSYCH:  alert and oriented x4; does not appear anxious does not appear depressed; affect is normal HEENT: Mucous membranes pink dry and anicteric; PERRLA; EOM intact; no cervical lymphadenopathy nor thyromegaly or carotid bruit; no JVD; very thick neck  Breasts:: Not examined CHEST WALL: No tenderness CHEST: Prolonged expirations bilateral rhonchi and bilateral crackles at the bases  HEART: Regular rate and rhythm; no murmurs rubs or gallops BACK: No kyphosis or scoliosis; no CVA tenderness ABDOMEN: Massively obese Obese, soft non-tender; no masses, no organomegaly, normal abdominal bowel sounds; no pannus; . Rectal Exam: Not done EXTREMITIES: No bone or joint deformity; age-appropriate arthropathy of the hands and knees; 1+ edema laterally edema;  Genitalia: not examined PULSES: 2+ and symmetric SKIN: Normal hydration no rash or ulceration CNS: Cranial nerves 2-12 grossly intact no focal neurologic deficit   Labs & Imaging Results for orders placed during the hospital encounter of 02/18/11 (from the past 48 hour(s))  BLOOD GAS, ARTERIAL     Status: Abnormal   Collection Time   02/18/11 12:50 AM      Component Value Range Comment   O2 Content 2.0      Delivery systems NASAL CANNULA      pH, Arterial 7.459 (*) 7.350 - 7.400     pCO2 arterial 35.7  35.0 - 45.0 (mmHg)    pO2, Arterial 75.6 (*) 80.0 - 100.0 (mmHg)    Bicarbonate 25.0 (*) 20.0 - 24.0 (mEq/L)    TCO2 20.3  0 - 100 (mmol/L)    Acid-Base Excess 1.5  0.0 - 2.0 (mmol/L)    O2  Saturation 96.1      Collection site LEFT RADIAL      Drawn by [id number]      Sample type ARTERIAL      Allens test (pass/fail) PASS  PASS    GLUCOSE, CAPILLARY     Status: Abnormal   Collection Time   02/18/11  9:53 PM      Component Value Range Comment   Glucose-Capillary 127 (*) 70 - 99 (mg/dL)   URINALYSIS, ROUTINE W REFLEX MICROSCOPIC     Status: Abnormal   Collection Time   02/18/11 11:36 PM      Component Value Range Comment   Color, Urine AMBER (*) YELLOW  BIOCHEMICALS MAY BE AFFECTED BY COLOR   APPearance CLEAR  CLEAR  Specific Gravity, Urine 1.025  1.005 - 1.030     pH 5.5  5.0 - 8.0     Glucose, UA NEGATIVE  NEGATIVE (mg/dL)    Hgb urine dipstick NEGATIVE  NEGATIVE     Bilirubin Urine SMALL (*) NEGATIVE     Ketones, ur NEGATIVE  NEGATIVE (mg/dL)    Protein, ur 161 (*) NEGATIVE (mg/dL)    Urobilinogen, UA 1.0  0.0 - 1.0 (mg/dL)    Nitrite NEGATIVE  NEGATIVE     Leukocytes, UA NEGATIVE  NEGATIVE    URINE MICROSCOPIC-ADD ON     Status: Abnormal   Collection Time   02/18/11 11:36 PM      Component Value Range Comment   Squamous Epithelial / LPF MANY (*) RARE     WBC, UA 3-6  <3 (WBC/hpf)    RBC / HPF 0-2  <3 (RBC/hpf)    Bacteria, UA FEW (*) RARE    PRO B NATRIURETIC PEPTIDE     Status: Abnormal   Collection Time   02/18/11 11:41 PM      Component Value Range Comment   Pro B Natriuretic peptide (BNP) 173.7 (*) 0 - 125 (pg/mL)   CARDIAC PANEL(CRET KIN+CKTOT+MB+TROPI)     Status: Abnormal   Collection Time   02/18/11 11:43 PM      Component Value Range Comment   Total CK 251 (*) 7 - 177 (U/L)    CK, MB 2.3  0.3 - 4.0 (ng/mL)    Troponin I <0.30  <0.30 (ng/mL)    Relative Index 0.9  0.0 - 2.5    CBC     Status: Abnormal   Collection Time   02/18/11 11:43 PM      Component Value Range Comment   WBC 25.3 (*) 4.0 - 10.5 (K/uL) WHITE COUNT CONFIRMED ON SMEAR   RBC 3.77 (*) 3.87 - 5.11 (MIL/uL)    Hemoglobin 11.3 (*) 12.0 - 15.0 (g/dL)    HCT 09.6 (*) 04.5 - 46.0  (%)    MCV 89.9  78.0 - 100.0 (fL)    MCH 30.0  26.0 - 34.0 (pg)    MCHC 33.3  30.0 - 36.0 (g/dL)    RDW 40.9  81.1 - 91.4 (%)    Platelets 254  150 - 400 (K/uL)   BASIC METABOLIC PANEL     Status: Abnormal   Collection Time   02/18/11 11:43 PM      Component Value Range Comment   Sodium 133 (*) 135 - 145 (mEq/L)    Potassium 3.5  3.5 - 5.1 (mEq/L)    Chloride 92 (*) 96 - 112 (mEq/L)    CO2 28  19 - 32 (mEq/L)    Glucose, Bld 132 (*) 70 - 99 (mg/dL)    BUN 28 (*) 6 - 23 (mg/dL)    Creatinine, Ser 7.82  0.50 - 1.10 (mg/dL)    Calcium 9.0  8.4 - 10.5 (mg/dL)    GFR calc non Af Amer 68 (*) >90 (mL/min)    GFR calc Af Amer 79 (*) >90 (mL/min)    Dg Chest 2 View  02/18/2011  *RADIOLOGY REPORT*  Clinical Data: Fever, cough, congestion and shortness of breath.  CHEST - 2 VIEW  Comparison: Chest radiograph performed 08/14/2010  Findings: The lungs are well-aerated.  Vascular congestion is noted, with bilateral central and bibasilar airspace opacities. This may reflect multifocal pneumonia or possibly edema.  There is no evidence of pleural effusion or pneumothorax.  The heart is  borderline enlarged; the mediastinal contour is within normal limits.  No acute osseous abnormalities are seen.  IMPRESSION: Vascular congestion and borderline cardiomegaly, with bilateral central and bibasilar airspace opacities.  This may reflect multifocal pneumonia or possibly edema.  Original Report Authenticated By: Tonia Ghent, M.D.      Assessment Present on Admission:   Acute resp failure .CAP (community acquired pneumonia) .COPD with acute exacerbation .CAD (coronary artery disease) .Dyslipidemia .Diabetes mellitus type 2 in obese .HTN (hypertension), benign .Morbid obesity   PLAN: Admit this lady for man management of her respiratory disease; spectrum antibiotics and nebulization; will withhold any further Lasix and recheck her BMP in the morning; he would probably benefit from a 2-D echo to  assess cardiac function; she has been advised on the importance of weight loss;  Will check a.m. and p.m. cortisol as a screening test for Cushing's disease.  Continue her home dose of Lantus; but will supplement only with sliding scale insulin  Other plans as per orders.    Rosell Khouri 02/19/2011, 3:18 AM

## 2011-02-20 LAB — GLUCOSE, CAPILLARY

## 2011-02-20 MED ORDER — LEVOFLOXACIN 750 MG PO TABS: 750.0000 mg | ORAL_TABLET | Freq: Every day | ORAL | Status: AC

## 2011-02-20 NOTE — Discharge Summary (Signed)
Physician Discharge Summary  Patient ID: Nicole [name] MRN: [id number] DOB/AGE: 11/19/1965 45 y.o.  Admit date: 02/18/2011 Discharge date: 02/20/2011    Discharge Diagnoses:  1. Community-acquired pneumonia, streptococcal pneumonia. Clinically improving. 2. Morbid obesity. 3. Diabetes mellitus, uncontrolled. 4. Hypertension.   Current Discharge Medication List    START taking these medications   Details  levofloxacin (LEVAQUIN) 750 MG tablet Take 1 tablet (750 mg total) by mouth daily. Qty: 7 tablet, Refills: 0      CONTINUE these medications which have NOT CHANGED   Details  albuterol (VENTOLIN HFA) 108 (90 BASE) MCG/ACT inhaler Inhale 2 puffs into the lungs as needed.      benzonatate (TESSALON) 100 MG capsule Take 100 mg by mouth 3 (three) times daily as needed. For cough     clopidogrel (PLAVIX) 75 MG tablet Take 75 mg by mouth daily.      fish oil-omega-3 fatty acids 1000 MG capsule Take 2 g by mouth 2 (two) times daily.      furosemide (LASIX) 40 MG tablet Take 40 mg by mouth daily.      insulin aspart (NOVOLOG) 100 UNIT/ML injection Inject 50 Units into the skin 3 (three) times daily before meals.      insulin glargine (LANTUS) 100 UNIT/ML injection Inject 65-70 Units into the skin 2 (two) times daily. Take 65 units in the morning and 70 units at bedtime     ipratropium-albuterol (DUONEB) 0.5-2.5 (3) MG/3ML SOLN Take 3 mLs by nebulization every 6 (six) hours as needed. For shortness of breath     lisinopril (PRINIVIL,ZESTRIL) 20 MG tablet Take 20 mg by mouth daily.      metoprolol (LOPRESSOR) 50 MG tablet Take 50 mg by mouth 2 (two) times daily.      potassium chloride SA (K-DUR,KLOR-CON) 20 MEQ tablet Take 20 mEq by mouth daily.      pravastatin (PRAVACHOL) 40 MG tablet Take 80 mg by mouth at bedtime.        STOP taking these medications     Potassium 99 MG TABS         Discharged Condition: Stable and improved.    Consults: None.  Significant  Diagnostic Studies: Dg Chest 2 View  02/18/2011  *RADIOLOGY REPORT*  Clinical Data: Fever, cough, congestion and shortness of breath.  CHEST - 2 VIEW  Comparison: Chest radiograph performed 08/14/2010  Findings: The lungs are well-aerated.  Vascular congestion is noted, with bilateral central and bibasilar airspace opacities. This may reflect multifocal pneumonia or possibly edema.  There is no evidence of pleural effusion or pneumothorax.  The heart is borderline enlarged; the mediastinal contour is within normal limits.  No acute osseous abnormalities are seen.  IMPRESSION: Vascular congestion and borderline cardiomegaly, with bilateral central and bibasilar airspace opacities.  This may reflect multifocal pneumonia or possibly edema.  Original Report Authenticated By: Tonia Ghent, M.D.    Lab Results: Basic Metabolic Panel:  Basename 02/19/11 0512 02/18/11 2343  NA 133* 133*  K 3.4* 3.5  CL 93* 92*  CO2 27 28  GLUCOSE 156* 132*  BUN 32* 28*  CREATININE 1.03 0.98  CALCIUM 8.8 9.0  MG -- --  PHOS -- --       CBC:  Basename 02/19/11 0512 02/18/11 2343  WBC 21.6* 25.3*  NEUTROABS -- --  HGB 11.4* 11.3*  HCT 34.2* 33.9*  MCV 89.8 89.9  PLT 248 254    Recent Results (from the past 240 hour(s))  CULTURE, BLOOD (ROUTINE  X 2)     Status: Normal (Preliminary result)   Collection Time   02/19/11  5:11 AM      Component Value Range Status Comment   Specimen Description BLOOD RIGHT ANTECUBITAL   Final    Special Requests BOTTLES DRAWN AEROBIC AND ANAEROBIC  2 CC EACH   Final    Culture NO GROWTH 1 DAY   Final    Report Status PENDING   Incomplete   CULTURE, BLOOD (ROUTINE X 2)     Status: Normal (Preliminary result)   Collection Time   02/19/11  5:35 AM      Component Value Range Status Comment   Specimen Description BLOOD RIGHT HAND   Final    Special Requests BOTTLES DRAWN AEROBIC AND ANAEROBIC  2 CC EACH   Final    Culture NO GROWTH 1 DAY   Final    Report Status  PENDING   Incomplete   CULTURE, SPUTUM-ASSESSMENT     Status: Normal   Collection Time   02/19/11  5:37 AM      Component Value Range Status Comment   Specimen Description SPU   Final    Special Requests NONE   Final    Sputum evaluation     Final    Value: THIS SPECIMEN IS ACCEPTABLE. RESPIRATORY CULTURE REPORT TO FOLLOW.   Report Status 02/19/2011 FINAL   Final   CULTURE, RESPIRATORY     Status: Normal (Preliminary result)   Collection Time   02/19/11  5:37 AM      Component Value Range Status Comment   Specimen Description SPUTUM   Final    Special Requests NONE   Final    Gram Stain     Final    Value: ABUNDANT WBC PRESENT,BOTH PMN AND MONONUCLEAR     RARE SQUAMOUS EPITHELIAL CELLS PRESENT     FEW GRAM POSITIVE COCCI     IN PAIRS RARE GRAM NEGATIVE RODS     RARE GRAM POSITIVE RODS   Culture PENDING   Incomplete    Report Status PENDING   Incomplete      Hospital Course: This very pleasant 45 year old lady was admitted with symptoms of progressive shortness of breath for the last 3-4 days. She also did have a productive cough of yellow sputum. Chest x-ray on admission was suggestive of bilateral pneumonia. She was therefore treated with intravenous antibiotics. She did not require steroids. Influenza test was negative. Streptococcal pneumoniae urinary antigen however was positive. She made a rather rapid improvement with intravenous antibiotics. Today she feels well enough to go home. In regard to her morbid obesity, I have discussed with her strategies for losing weight, involving low glycemic index foods. Her diabetes was not well controlled with hemoglobin A1c of 8.5%.  Discharge Exam: Blood pressure 162/72, pulse 86, temperature 98 F (36.7 C), temperature source Oral, resp. rate 19, height 5\' 3"  (1.6 m), weight 138.12 kg (304 lb 8 oz), SpO2 97.00%. She looks systemically well. She is not toxic or septic. Lung fields are entirely clear. Heart sounds are present and normal. She  is alert and orientated.  Disposition: Home. She will have a further 7 day course of Levaquin. She already has an appointment with her primary care physician on January 8th 2013 and she will keep this.  Discharge Orders    Future Orders Please Complete By Expires   Diet - low sodium heart healthy      Increase activity slowly  SignedWilson Singer Pager [phone]  02/20/2011, 8:11 AM

## 2011-02-20 NOTE — Progress Notes (Signed)
Patient was given prescriptions and instruction and verbalized that she understands them. IV cath removed with cath intacted and tolerated well.

## 2011-02-21 LAB — CULTURE, RESPIRATORY W GRAM STAIN: Culture: NORMAL

## 2011-02-21 LAB — LEGIONELLA ANTIGEN, URINE: Legionella Antigen, Urine: NEGATIVE

## 2011-02-24 LAB — CULTURE, BLOOD (ROUTINE X 2)
Culture: NO GROWTH
Culture: NO GROWTH

## 2011-03-02 ENCOUNTER — Encounter: Payer: Self-pay | Admitting: Cardiology

## 2011-03-04 ENCOUNTER — Ambulatory Visit (HOSPITAL_COMMUNITY)
Admission: RE | Admit: 2011-03-04 | Discharge: 2011-03-04 | Disposition: A | Discharge status: Home / Self Care | Payer: Self-pay | Source: Ambulatory Visit | Attending: Physician Assistant | Admitting: Physician Assistant

## 2011-03-04 DIAGNOSIS — Z139 Encounter for screening, unspecified: Secondary | ICD-10-CM

## 2011-03-23 ENCOUNTER — Encounter: Payer: Self-pay | Admitting: *Deleted

## 2011-03-23 ENCOUNTER — Ambulatory Visit (INDEPENDENT_AMBULATORY_CARE_PROVIDER_SITE_OTHER): Payer: Self-pay | Admitting: Cardiology

## 2011-03-23 ENCOUNTER — Encounter: Payer: Self-pay | Admitting: Cardiology

## 2011-03-23 VITALS — BP 161/74 | HR 84 | Ht 63.0 in | Wt 313.0 lb

## 2011-03-23 DIAGNOSIS — R0602 Shortness of breath: Secondary | ICD-10-CM

## 2011-03-23 DIAGNOSIS — E785 Hyperlipidemia, unspecified: Secondary | ICD-10-CM

## 2011-03-23 DIAGNOSIS — I10 Essential (primary) hypertension: Secondary | ICD-10-CM

## 2011-03-23 DIAGNOSIS — I2510 Atherosclerotic heart disease of native coronary artery without angina pectoris: Secondary | ICD-10-CM

## 2011-03-23 NOTE — Assessment & Plan Note (Signed)
On statin therapy. Would aim for LDL less than 100.

## 2011-03-23 NOTE — Patient Instructions (Signed)
Your physician recommends that you schedule a follow-up appointment in:  3-4 weeks.   Your physician has requested that you have a lexiscan myoview. For further information please visit [email or web address]. Please follow instruction sheet, as given.   Your physician has requested that you have an echocardiogram. Echocardiography is a painless test that uses sound waves to create images of your heart. It provides your doctor with information about the size and shape of your heart and how well your heart's chambers and valves are working. This procedure takes approximately one hour. There are no restrictions for this procedure.    

## 2011-03-23 NOTE — Assessment & Plan Note (Signed)
Blood pressure is elevated - may need further medication adjustments. Obviously weight loss would also be of benefit.

## 2011-03-23 NOTE — Assessment & Plan Note (Signed)
Limited history reviewed. She did have a stent card with her today. No ischemic testing since 2009. She has had some chest pain symptoms and dyspnea. ECG reviewed. The question of CHF is noted, although her hospitalization ultimately defined pneumonia without clear evidence of CHF. Echocardiogram was very limited and not particularly useful from a diagnostic perspective. Plan to proceed with a 2 day protocol Lexiscan Myoview and also a contrast echocardiogram through the [place] office to better evaluate LVEF. Followup arranged.

## 2011-03-23 NOTE — Assessment & Plan Note (Signed)
Weight loss recommended 

## 2011-03-23 NOTE — Progress Notes (Signed)
Clinical Summary Nicole [name] is a 46 y.o.female referred from the [facility] for evaluation of reported CHF. Records were reviewed. Discharge summary from 12/12 indicates diagnosis of Streptococcal pneumonia. Pro-BNP was only minimally elevated. Echocardiogram was very poor quality and did not indicate an ejection fraction. She is here with her husband today, reports occasional chest pain, NYHA class II dyspnea on exertion. Has also had some "numbness" in her left arm and legs with emotional upset and "stress."  Cardiac history is noted below - no other records available. She states that followup cardiac catheterization at [facility] in 2009 "looked good."  She reports compliance with her medications. ECG is reviewed below.  Allergies  Allergen Reactions  . Aspirin Anaphylaxis  . Bee Venom Anaphylaxis  . Food Anaphylaxis    PINEAPPLE  . E-Mycin (Erythromycin Base)     Current Outpatient Prescriptions  Medication Sig Dispense Refill  . albuterol (VENTOLIN HFA) 108 (90 BASE) MCG/ACT inhaler Inhale 2 puffs into the lungs as needed.        . clopidogrel (PLAVIX) 75 MG tablet Take 75 mg by mouth daily.        . fish oil-omega-3 fatty acids 1000 MG capsule Take 2 g by mouth 2 (two) times daily.        . furosemide (LASIX) 40 MG tablet Take 40 mg by mouth daily.        . insulin aspart (NOVOLOG) 100 UNIT/ML injection Inject 50 Units into the skin 3 (three) times daily before meals.        . insulin glargine (LANTUS) 100 UNIT/ML injection Inject 65-70 Units into the skin 2 (two) times daily. Take 65 units in the morning and 70 units at bedtime       . ipratropium-albuterol (DUONEB) 0.5-2.5 (3) MG/3ML SOLN Take 3 mLs by nebulization every 6 (six) hours as needed. For shortness of breath       . lisinopril (PRINIVIL,ZESTRIL) 20 MG tablet Take 20 mg by mouth daily.        . metoprolol (LOPRESSOR) 50 MG tablet Take 50 mg by mouth 2 (two) times daily.        . pravastatin (PRAVACHOL) 40 MG tablet  Take 40 mg by mouth at bedtime.         Past Medical History  Diagnosis Date  . Essential hypertension, benign   . Type 2 diabetes mellitus   . Morbid obesity   . Dyslipidemia   . Coronary atherosclerosis of native coronary artery     Diagnosed [place] 2006 - DES RCA, reports followup cath 2009 at [facility]   . COPD (chronic obstructive pulmonary disease)   . CAP (community acquired pneumonia)     Streptococcus 01/2011  . Pancreatitis     Past Surgical History  Procedure Date  . Abdominal hysterectomy   . Tonsillectomy   . Knee surgery   . Pilonidal cystectomy   . Nose surgery   . Abdominal hernia repair     Family History  Problem Relation Age of Onset  . Diabetes Mother   . Heart failure Mother   . Hypertension Mother   . Kidney failure Mother     Social History Nicole [name] reports that she quit smoking about 6 years ago. Her smoking use included Cigarettes. She has a 30 pack-year smoking history. She has never used smokeless tobacco. Nicole [name] reports that she does not drink alcohol.  Review of Systems No palpitations or syncope. Appetite stable. No regular exercise. No orthopnea or  PND. No fevers or chills. Otherwise negative.  Physical Examination Filed Vitals:   03/23/11 1452  BP: 161/74  Pulse: 84   Morbidly obese woman in NAD. HEENT: Conjunctiva and lids normal, oropharynx clear. Neck: Supple, increased girth, no carotid bruits, no thyromegaly. Lungs: Clear to auscultation, nonlabored breathing at rest. Cardiac: Regular rate and rhythm, no S3, soft systolic murmur, PMI indistinct. Abdomen: Soft, nontender,bowel sounds present, no guarding or rebound. Extremities: No pitting edema, distal pulses 2+. Skin: Warm and dry. Musculoskeletal: No kyphosis. Neuropsychiatric: Alert and oriented x3, affect grossly appropriate.   ECG Sinus rhythm with RBBB and LAFB.    Problem List and Plan

## 2011-04-01 ENCOUNTER — Encounter (HOSPITAL_COMMUNITY): Payer: Self-pay

## 2011-04-01 ENCOUNTER — Inpatient Hospital Stay (HOSPITAL_COMMUNITY): Admission: RE | Admit: 2011-04-01 | Payer: Self-pay | Source: Ambulatory Visit

## 2011-04-01 ENCOUNTER — Encounter: Payer: Self-pay | Admitting: *Deleted

## 2011-04-02 ENCOUNTER — Encounter (HOSPITAL_COMMUNITY): Payer: Self-pay

## 2011-04-02 ENCOUNTER — Telehealth: Payer: Self-pay | Admitting: Cardiology

## 2011-04-02 ENCOUNTER — Ambulatory Visit (HOSPITAL_COMMUNITY): Payer: Self-pay

## 2011-04-02 NOTE — Telephone Encounter (Signed)
**Note De-Identified  Obfuscation** Pt. advised and she verbalized understanding./LV

## 2011-04-02 NOTE — Telephone Encounter (Signed)
Patient states that she did not get instructions for stress test.  Please return her call to let her know which meds she is not to take. / tg

## 2011-04-05 ENCOUNTER — Other Ambulatory Visit (HOSPITAL_COMMUNITY): Payer: Self-pay | Admitting: Radiology

## 2011-04-05 DIAGNOSIS — R011 Cardiac murmur, unspecified: Secondary | ICD-10-CM

## 2011-04-06 ENCOUNTER — Ambulatory Visit (HOSPITAL_COMMUNITY): Payer: Self-pay | Attending: Cardiology | Admitting: Radiology

## 2011-04-06 ENCOUNTER — Other Ambulatory Visit (HOSPITAL_COMMUNITY): Payer: Self-pay | Admitting: Cardiology

## 2011-04-06 DIAGNOSIS — I509 Heart failure, unspecified: Secondary | ICD-10-CM

## 2011-04-06 DIAGNOSIS — E119 Type 2 diabetes mellitus without complications: Secondary | ICD-10-CM | POA: Insufficient documentation

## 2011-04-06 DIAGNOSIS — J4489 Other specified chronic obstructive pulmonary disease: Secondary | ICD-10-CM | POA: Insufficient documentation

## 2011-04-06 DIAGNOSIS — E785 Hyperlipidemia, unspecified: Secondary | ICD-10-CM | POA: Insufficient documentation

## 2011-04-06 DIAGNOSIS — R609 Edema, unspecified: Secondary | ICD-10-CM | POA: Insufficient documentation

## 2011-04-06 DIAGNOSIS — I10 Essential (primary) hypertension: Secondary | ICD-10-CM | POA: Insufficient documentation

## 2011-04-06 DIAGNOSIS — Z6841 Body Mass Index (BMI) 40.0 and over, adult: Secondary | ICD-10-CM | POA: Insufficient documentation

## 2011-04-06 DIAGNOSIS — I2510 Atherosclerotic heart disease of native coronary artery without angina pectoris: Secondary | ICD-10-CM | POA: Insufficient documentation

## 2011-04-06 DIAGNOSIS — J449 Chronic obstructive pulmonary disease, unspecified: Secondary | ICD-10-CM | POA: Insufficient documentation

## 2011-04-06 DIAGNOSIS — R0602 Shortness of breath: Secondary | ICD-10-CM

## 2011-04-06 MED ORDER — PERFLUTREN LIPID MICROSPHERE 6.52 MG/ML IV SUSP
4.0000 uL/kg | Freq: Once | INTRAVENOUS | Status: AC
  Administered 2011-04-06: 0.627 mg via INTRAVENOUS

## 2011-04-14 ENCOUNTER — Ambulatory Visit (HOSPITAL_COMMUNITY): Admission: RE | Admit: 2011-04-14 | Payer: Self-pay | Source: Ambulatory Visit

## 2011-04-14 ENCOUNTER — Ambulatory Visit (HOSPITAL_COMMUNITY)
Admission: RE | Admit: 2011-04-14 | Discharge: 2011-04-14 | Disposition: A | Discharge status: Home / Self Care | Payer: Self-pay | Source: Ambulatory Visit | Attending: Cardiology | Admitting: Cardiology

## 2011-04-14 DIAGNOSIS — R0602 Shortness of breath: Secondary | ICD-10-CM

## 2011-04-15 ENCOUNTER — Encounter (HOSPITAL_COMMUNITY): Payer: Self-pay

## 2011-04-15 ENCOUNTER — Ambulatory Visit (HOSPITAL_COMMUNITY): Payer: Self-pay

## 2011-04-15 ENCOUNTER — Ambulatory Visit: Payer: Self-pay | Admitting: Cardiology

## 2011-04-23 ENCOUNTER — Ambulatory Visit: Payer: Self-pay | Admitting: Cardiology

## 2011-05-07 ENCOUNTER — Ambulatory Visit: Payer: Self-pay | Admitting: Cardiology

## 2011-06-10 ENCOUNTER — Ambulatory Visit (INDEPENDENT_AMBULATORY_CARE_PROVIDER_SITE_OTHER): Payer: Self-pay | Admitting: Cardiology

## 2011-06-10 ENCOUNTER — Encounter: Payer: Self-pay | Admitting: Cardiology

## 2011-06-10 DIAGNOSIS — E785 Hyperlipidemia, unspecified: Secondary | ICD-10-CM

## 2011-06-10 DIAGNOSIS — R0609 Other forms of dyspnea: Secondary | ICD-10-CM

## 2011-06-10 DIAGNOSIS — R079 Chest pain, unspecified: Secondary | ICD-10-CM

## 2011-06-10 DIAGNOSIS — I10 Essential (primary) hypertension: Secondary | ICD-10-CM

## 2011-06-10 DIAGNOSIS — R0989 Other specified symptoms and signs involving the circulatory and respiratory systems: Secondary | ICD-10-CM

## 2011-06-10 DIAGNOSIS — I2510 Atherosclerotic heart disease of native coronary artery without angina pectoris: Secondary | ICD-10-CM

## 2011-06-10 DIAGNOSIS — R0600 Dyspnea, unspecified: Secondary | ICD-10-CM | POA: Insufficient documentation

## 2011-06-10 NOTE — Assessment & Plan Note (Signed)
We discussed weight loss.  I would suggest the [facility] Diet.

## 2011-06-10 NOTE — Assessment & Plan Note (Signed)
Her LDL was 100 well. HDL was 24. This is followed by her primary provider and I will for to her management.

## 2011-06-10 NOTE — Patient Instructions (Signed)
The current medical regimen is effective;  continue present plan and medications.  Your physician has requested that you have a lexiscan myoview. For further information please visit [email or web address]. Please follow instruction sheet, as given.   

## 2011-06-10 NOTE — Assessment & Plan Note (Signed)
I suspect this is multifactorial and in part related to weight. We did discuss a 4 g sodium diet which I think would help.

## 2011-06-10 NOTE — Progress Notes (Signed)
HPI The patient saw Dr. Diona Browner for evaluation after a hospitalization for pneumonia and questionable heart failure. She has known coronary disease. He did get an echo which demonstrated a preserved ejection fraction. He set her up for a pharmacologic perfusion study at [facility].  However, she states that they could only do this in the afternoon and she needed a morning study because of her diabetes. She continues to get chest discomfort. This is happening 3-4 times per day. It happens at rest. It is in her mid chest. It is a throbbing and sharp discomfort. At times it is a "nagging" discomfort. She does not describe associated symptoms.  She does not describe palpitations, presyncope or syncope. She has no PND or orthopnea. The symptoms happen at rest or if she is stressed with her children. She has some chronic dyspnea with activities such as climbing stairs. She has chronic edema. She is morbidly obese.    Allergies  Allergen Reactions  . Aspirin Anaphylaxis  . Bee Venom Anaphylaxis  . Food Anaphylaxis    PINEAPPLE  . E-Mycin (Erythromycin Base)     Current Outpatient Prescriptions  Medication Sig Dispense Refill  . albuterol (VENTOLIN HFA) 108 (90 BASE) MCG/ACT inhaler Inhale 2 puffs into the lungs as needed.        . citalopram (CELEXA) 20 MG tablet Take 20 mg by mouth daily.      . clopidogrel (PLAVIX) 75 MG tablet Take 75 mg by mouth daily.        . fish oil-omega-3 fatty acids 1000 MG capsule Take 2 g by mouth 2 (two) times daily.        . furosemide (LASIX) 40 MG tablet Take 40 mg by mouth daily.        . insulin aspart (NOVOLOG) 100 UNIT/ML injection Inject 50 Units into the skin 3 (three) times daily before meals.        . insulin glargine (LANTUS) 100 UNIT/ML injection Inject 65-70 Units into the skin 2 (two) times daily. Take 65 units in the morning and 70 units at bedtime       . ipratropium-albuterol (DUONEB) 0.5-2.5 (3) MG/3ML SOLN Take 3 mLs by nebulization every 6 (six)  hours as needed. For shortness of breath       . lisinopril (PRINIVIL,ZESTRIL) 20 MG tablet Take 20 mg by mouth daily.        . metoprolol (LOPRESSOR) 50 MG tablet Take 50 mg by mouth 2 (two) times daily.        . pravastatin (PRAVACHOL) 40 MG tablet Take 40 mg by mouth at bedtime.         Past Medical History  Diagnosis Date  . Essential hypertension, benign   . Type 2 diabetes mellitus   . Morbid obesity   . Dyslipidemia   . Coronary atherosclerosis of native coronary artery     Diagnosed [place] 2006 - DES RCA, reports followup cath 2009 at [facility]   . COPD (chronic obstructive pulmonary disease)   . CAP (community acquired pneumonia)     Streptococcus 01/2011  . Pancreatitis     Past Surgical History  Procedure Date  . Abdominal hysterectomy   . Tonsillectomy   . Knee surgery   . Pilonidal cystectomy   . Nose surgery   . Abdominal hernia repair     ROS:  As stated in the HPI and negative for all other systems.  PHYSICAL EXAM BP 139/70  Pulse 69  Ht 5\' 3"  (1.6 m)  Wt 311 lb 1.9 oz (141.123 kg)  BMI 55.11 kg/m2 GENERAL:  Well appearing HEENT:  Pupils equal round and reactive, fundi not visualized, oral mucosa unremarkable NECK:  No jugular venous distention, waveform within normal limits, carotid upstroke brisk and symmetric, no bruits, no thyromegaly LYMPHATICS:  No cervical, inguinal adenopathy LUNGS:  Clear to auscultation bilaterally BACK:  No CVA tenderness CHEST:  Unremarkable HEART:  PMI not displaced or sustained,S1 and S2 within normal limits, no S3, no S4, no clicks, no rubs, no murmurs ABD:  Flat, positive bowel sounds normal in frequency in pitch, no bruits, no rebound, no guarding, no midline pulsatile mass, no hepatomegaly, no splenomegaly EXT:  2 plus pulses throughout, no edema, no cyanosis no clubbing SKIN:  No rashes no nodules NEURO:  Cranial nerves II through XII grossly intact, motor grossly intact throughout PSYCH:  Cognitively intact,  oriented to person place and time  EKG:  Sinus rhythm, right bundle branch block, left axis deviation, left such loss, no acute ST-T wave changes. 06/10/2011   ASSESSMENT AND PLAN

## 2011-06-10 NOTE — Assessment & Plan Note (Signed)
The blood pressure is at target. No change in medications is indicated. We will continue with therapeutic lifestyle changes (TLC).  

## 2011-06-10 NOTE — Assessment & Plan Note (Signed)
I agree with the previous assessment that she will need stress testing. She would not be a little walk on a treadmill so she will have a 2 day Lexiscan Myoview.

## 2011-06-23 ENCOUNTER — Ambulatory Visit (HOSPITAL_COMMUNITY): Payer: Self-pay | Attending: Cardiology | Admitting: Radiology

## 2011-06-23 DIAGNOSIS — E669 Obesity, unspecified: Secondary | ICD-10-CM | POA: Insufficient documentation

## 2011-06-23 DIAGNOSIS — I2510 Atherosclerotic heart disease of native coronary artery without angina pectoris: Secondary | ICD-10-CM | POA: Insufficient documentation

## 2011-06-23 DIAGNOSIS — R0609 Other forms of dyspnea: Secondary | ICD-10-CM | POA: Insufficient documentation

## 2011-06-23 DIAGNOSIS — E785 Hyperlipidemia, unspecified: Secondary | ICD-10-CM | POA: Insufficient documentation

## 2011-06-23 DIAGNOSIS — E119 Type 2 diabetes mellitus without complications: Secondary | ICD-10-CM | POA: Insufficient documentation

## 2011-06-23 DIAGNOSIS — R079 Chest pain, unspecified: Secondary | ICD-10-CM | POA: Insufficient documentation

## 2011-06-23 DIAGNOSIS — R0989 Other specified symptoms and signs involving the circulatory and respiratory systems: Secondary | ICD-10-CM | POA: Insufficient documentation

## 2011-06-23 DIAGNOSIS — R002 Palpitations: Secondary | ICD-10-CM | POA: Insufficient documentation

## 2011-06-23 DIAGNOSIS — R0602 Shortness of breath: Secondary | ICD-10-CM | POA: Insufficient documentation

## 2011-06-23 DIAGNOSIS — Z794 Long term (current) use of insulin: Secondary | ICD-10-CM | POA: Insufficient documentation

## 2011-06-23 DIAGNOSIS — I4510 Unspecified right bundle-branch block: Secondary | ICD-10-CM | POA: Insufficient documentation

## 2011-06-23 DIAGNOSIS — I10 Essential (primary) hypertension: Secondary | ICD-10-CM | POA: Insufficient documentation

## 2011-06-23 MED ORDER — TECHNETIUM TC 99M TETROFOSMIN IV KIT
30.0000 | PACK | Freq: Once | INTRAVENOUS | Status: AC | PRN
  Administered 2011-06-23: 30 via INTRAVENOUS

## 2011-06-23 MED ORDER — REGADENOSON 0.4 MG/5ML IV SOLN
0.4000 mg | Freq: Once | INTRAVENOUS | Status: AC
  Administered 2011-06-23: 0.4 mg via INTRAVENOUS

## 2011-06-23 NOTE — Progress Notes (Signed)
[facility] SITE 3 NUCLEAR MED [address] [place] [address] [phone]  Cardiology Nuclear Med Study  Nicole [name] is a 46 y.o. female     MRN : [id number]     DOB: 07-Aug-1965  Procedure Date: 06/23/2011  Nuclear Med Background Indication for Stress Test:  Evaluation for Ischemia and Stent Patency History: CHD<COPD< 2006, Stents: RCA, 2009 Heart Cath: [facility]: NL per pt, 04/06/11: ECHO: EF: 60% Cardiac Risk Factors: Hypertension, IDDM Type 2, Lipids, Obesity and RBBB  Symptoms:  Chest Pain, DOE, Palpitations and SOB   Nuclear Pre-Procedure Caffeine/Decaff Intake:  None > 12 hrs NPO After: 12:00am   Lungs:  clear O2 Sat: 96% on room air. IV 0.9% NS with Angio Cath:  22g  IV Site: L Antecubital x 1, tolerated well IV Started by:  Irean Hong, RN  Chest Size (in):  46 Cup Size: C  Height: 5\' 3"  (1.6 m)  Weight:  308 lb (139.708 kg)  BMI:  Body mass index is 54.56 kg/(m^2). Tech Comments:  FBS 147 at 6:30am per patient, no insulin today, took lopressor this am    Nuclear Med Study 1 or 2 day study: 2 day  Stress Test Type:  Eugenie Birks  Reading MD: Marca Ancona, MD  Order Authorizing Provider:  Rollene Rotunda, MD  Resting Radionuclide: Technetium 11m Tetrofosmin  Resting Radionuclide Dose: 33.0 mCi 06/30/11  Stress Radionuclide:  Technetium 41m Tetrofosmin  Stress Radionuclide Dose: 33.0 mCi  06/23/11          Stress Protocol Rest HR: 66 Stress HR: 75  Rest BP: 155/95 Stress BP: 135/59  Exercise Time (min): n/a METS: n/a   Predicted Max HR: 173 bpm % Max HR: 43.35 bpm Rate Pressure Product: 21308   Dose of Adenosine (mg):  n/a Dose of Lexiscan: 0.4 mg  Dose of Atropine (mg): n/a Dose of Dobutamine: n/a mcg/kg/min (at max HR)  Stress Test Technologist: Milana Na, EMT-P  Nuclear Technologist:  Domenic Polite, CNMT     Rest Procedure:  Myocardial perfusion imaging was performed at rest 45 minutes following the intravenous administration of  Technetium 66m Tetrofosmin. Rest ECG: NSR-RBBB  Stress Procedure:  The patient received IV Lexiscan 0.4 mg over 15-seconds.  Technetium 39m Tetrofosmin injected at 30-seconds.  There were no significant changes, + sob, and flushed with Lexiscan.  Quantitative spect images were obtained after a 45 minute delay. Stress ECG: No significant change from baseline ECG  QPS Raw Data Images:  Normal; no motion artifact; normal heart/lung ratio. Stress Images:  Normal homogeneous uptake in all areas of the myocardium. Rest Images:  Normal homogeneous uptake in all areas of the myocardium. Subtraction (SDS):  Normal Transient Ischemic Dilatation (Normal <1.22):  1.07 Lung/Heart Ratio (Normal <0.45):  0.42  Quantitative Gated Spect Images QGS EDV:  108 ml QGS ESV:  39 ml  Impression Exercise Capacity:  Lexiscan with no exercise. BP Response:  Normal blood pressure response. Clinical Symptoms:  No significant symptoms noted. ECG Impression:  No significant ST segment change suggestive of ischemia. Comparison with Prior Nuclear Study: No images to compare  Overall Impression:  Normal stress nuclear study.  LV Ejection Fraction: 64%.  LV Wall Motion:  NL LV Function; NL Wall Motion    Charlton Haws

## 2011-06-28 ENCOUNTER — Encounter (HOSPITAL_COMMUNITY): Payer: Self-pay

## 2011-06-30 ENCOUNTER — Ambulatory Visit (HOSPITAL_COMMUNITY): Payer: Self-pay | Attending: Cardiovascular Disease | Admitting: Radiology

## 2011-06-30 DIAGNOSIS — R0989 Other specified symptoms and signs involving the circulatory and respiratory systems: Secondary | ICD-10-CM

## 2011-06-30 MED ORDER — TECHNETIUM TC 99M TETROFOSMIN IV KIT
33.0000 | PACK | Freq: Once | INTRAVENOUS | Status: AC | PRN
  Administered 2011-06-30: 33 via INTRAVENOUS

## 2011-07-24 ENCOUNTER — Emergency Department (HOSPITAL_COMMUNITY): Payer: Self-pay

## 2011-07-24 ENCOUNTER — Emergency Department (HOSPITAL_COMMUNITY)
Admission: EM | Admit: 2011-07-24 | Discharge: 2011-07-24 | Disposition: A | Discharge status: Home / Self Care | Payer: Self-pay | Attending: Emergency Medicine | Admitting: Emergency Medicine

## 2011-07-24 ENCOUNTER — Encounter (HOSPITAL_COMMUNITY): Payer: Self-pay | Admitting: Emergency Medicine

## 2011-07-24 DIAGNOSIS — W010XXA Fall on same level from slipping, tripping and stumbling without subsequent striking against object, initial encounter: Secondary | ICD-10-CM | POA: Insufficient documentation

## 2011-07-24 DIAGNOSIS — E785 Hyperlipidemia, unspecified: Secondary | ICD-10-CM | POA: Insufficient documentation

## 2011-07-24 DIAGNOSIS — S4980XA Other specified injuries of shoulder and upper arm, unspecified arm, initial encounter: Secondary | ICD-10-CM | POA: Insufficient documentation

## 2011-07-24 DIAGNOSIS — J4489 Other specified chronic obstructive pulmonary disease: Secondary | ICD-10-CM | POA: Insufficient documentation

## 2011-07-24 DIAGNOSIS — E119 Type 2 diabetes mellitus without complications: Secondary | ICD-10-CM | POA: Insufficient documentation

## 2011-07-24 DIAGNOSIS — I2510 Atherosclerotic heart disease of native coronary artery without angina pectoris: Secondary | ICD-10-CM | POA: Insufficient documentation

## 2011-07-24 DIAGNOSIS — Y92009 Unspecified place in unspecified non-institutional (private) residence as the place of occurrence of the external cause: Secondary | ICD-10-CM | POA: Insufficient documentation

## 2011-07-24 DIAGNOSIS — Z87891 Personal history of nicotine dependence: Secondary | ICD-10-CM | POA: Insufficient documentation

## 2011-07-24 DIAGNOSIS — Z79899 Other long term (current) drug therapy: Secondary | ICD-10-CM | POA: Insufficient documentation

## 2011-07-24 DIAGNOSIS — I10 Essential (primary) hypertension: Secondary | ICD-10-CM | POA: Insufficient documentation

## 2011-07-24 DIAGNOSIS — Z794 Long term (current) use of insulin: Secondary | ICD-10-CM | POA: Insufficient documentation

## 2011-07-24 DIAGNOSIS — S46909A Unspecified injury of unspecified muscle, fascia and tendon at shoulder and upper arm level, unspecified arm, initial encounter: Secondary | ICD-10-CM | POA: Insufficient documentation

## 2011-07-24 DIAGNOSIS — M25519 Pain in unspecified shoulder: Secondary | ICD-10-CM

## 2011-07-24 DIAGNOSIS — J449 Chronic obstructive pulmonary disease, unspecified: Secondary | ICD-10-CM | POA: Insufficient documentation

## 2011-07-24 MED ORDER — OXYCODONE-ACETAMINOPHEN 5-325 MG PO TABS
1.0000 | ORAL_TABLET | Freq: Once | ORAL | Status: AC
  Administered 2011-07-24: 1 via ORAL
  Filled 2011-07-24: qty 1

## 2011-07-24 MED ORDER — OXYCODONE-ACETAMINOPHEN 5-325 MG PO TABS: 1.0000 | ORAL_TABLET | ORAL | Status: AC | PRN

## 2011-07-24 NOTE — Discharge Instructions (Signed)
Shoulder Pain The shoulder is a ball and socket joint. The muscles and tendons (rotator cuff) are what keep the shoulder in its joint and stable. This collection of muscles and tendons holds in the head (ball) of the humerus (upper arm bone) in the fossa (cup) of the scapula (shoulder blade). Today no reason was found for your shoulder pain. Often pain in the shoulder may be treated conservatively with temporary immobilization. For example, holding the shoulder in one place using a sling for rest. Physical therapy may be needed if problems continue. HOME CARE INSTRUCTIONS   Apply ice to the sore area for 15 to 20 minutes, 3 to 4 times per day for the first 2 days. Put the ice in a plastic bag. Place a towel between the bag of ice and your skin.   If you have or were given a shoulder sling and straps, do not remove for as long as directed by your caregiver or until you see a caregiver for a follow-up examination. If you need to remove it to shower or bathe, move your arm as little as possible.   Sleep on several pillows at night to lessen swelling and pain.   Only take over-the-counter or prescription medicines for pain, discomfort, or fever as directed by your caregiver.   Keep any follow-up appointments in order to avoid any type of permanent shoulder disability or chronic pain problems.  SEEK MEDICAL CARE IF:   Pain in your shoulder increases or new pain develops in your arm, hand, or fingers.   Your hand or fingers are colder than your other hand.   You do not obtain pain relief with the medications or your pain becomes worse.  SEEK IMMEDIATE MEDICAL CARE IF:   Your arm, hand, or fingers are numb or tingling.   Your arm, hand, or fingers are swollen, painful, or turn white or blue.   You develop chest pain or shortness of breath.  MAKE SURE YOU:   Understand these instructions.   Will watch your condition.   Will get help right away if you are not doing well or get worse.    Document Released: 11/18/2004 Document Revised: 01/28/2011 Document Reviewed: 01/23/2011 [employer] Patient Information 2012 [employer]. 

## 2011-07-24 NOTE — ED Notes (Signed)
Pt states she fell one week ago and c/o left arm pain. Pt states she cannot move the arm.

## 2011-07-24 NOTE — ED Notes (Signed)
Pt  Presents with left shoulder pain that radiates into left bicep. Pt reports falling a week ago while camping. Vicodin and ice used throughout the week with no relief. Pulses are present bilaterally and equal. NAD noted. Pt states does not remember the fall. Denies hitting her head or LOC as does family members.

## 2011-07-24 NOTE — ED Provider Notes (Signed)
History     CSN: [id number]  Arrival date & time 07/24/11  1543   First MD Initiated Contact with Patient 07/24/11 1555      Chief Complaint  Patient presents with  . Arm Pain  . Fall    (Consider location/radiation/quality/duration/timing/severity/associated sxs/prior treatment) HPI Comments: Patient c/o pain to the left shoulder for one week PTA.  [place].  States she tripped fell and "rolloed over" to try to get up and fell onto her shoulder.  Husband witnessed the event.  Since that time, patient reports having pain with movement of her arm and pan improves with holding her arm close to her body.  She denies fever, vomiting, neck pain, numbness, weakness or swelling.  Denies headache, head injury or LOC.  Patient is a 46 y.o. female presenting with arm injury. The history is provided by the patient.  Arm Injury  The incident occurred more than 2 days ago. The incident occurred at home. The injury mechanism was a fall. Context: fell while camping.   The wounds were self-inflicted. No protective equipment was used. She came to the ER via personal transport. There is an injury to the left shoulder. The pain is moderate. It is unlikely that a foreign body is present. Pertinent negatives include no chest pain, no numbness, no visual disturbance, no nausea, no vomiting, no headaches, no inability to bear weight, no neck pain, no focal weakness, no decreased responsiveness, no loss of consciousness and no tingling. There have been no prior injuries to these areas. She is right-handed. She has been behaving normally. There were no sick contacts. She has received no recent medical care.    Past Medical History  Diagnosis Date  . Essential hypertension, benign   . Type 2 diabetes mellitus   . Morbid obesity   . Dyslipidemia   . Coronary atherosclerosis of native coronary artery     Diagnosed [place] 2006 - DES RCA, reports followup cath 2009  at [facility]   . COPD (chronic obstructive pulmonary disease)   . CAP (community acquired pneumonia)     Streptococcus 01/2011  . Pancreatitis     Past Surgical History  Procedure Date  . Abdominal hysterectomy   . Tonsillectomy   . Knee surgery   . Pilonidal cystectomy   . Nose surgery   . Abdominal hernia repair     Family History  Problem Relation Age of Onset  . Diabetes Mother   . Heart failure Mother   . Hypertension Mother   . Kidney failure Mother     History  Substance Use Topics  . Smoking status: Former Smoker -- 1.5 packs/day for 20 years    Types: Cigarettes    Quit date: 08/11/2004  . Smokeless tobacco: Never Used  . Alcohol Use: No    OB History    Grav Para Term Preterm Abortions TAB SAB Ect Mult Living                  Review of Systems  Constitutional: Negative for activity change, appetite change and decreased responsiveness.  HENT: Negative for neck pain.   Eyes: Negative for visual disturbance.  Cardiovascular: Negative for chest pain.  Gastrointestinal: Negative for nausea and vomiting.  Musculoskeletal: Positive for arthralgias. Negative for back pain and joint swelling.  Skin: Negative for color change and wound.  Neurological: Negative for dizziness, tingling, focal weakness, loss of consciousness, numbness and headaches.  Psychiatric/Behavioral:  Negative for confusion.  All other systems reviewed and are negative.    Allergies  Aspirin; Bee venom; Food; and E-mycin  Home Medications   Current Outpatient Rx  Name Route Sig Dispense Refill  . ALBUTEROL SULFATE HFA 108 (90 BASE) MCG/ACT IN AERS Inhalation Inhale 2 puffs into the lungs as needed. Shortness of breath    . CITALOPRAM HYDROBROMIDE 20 MG PO TABS Oral Take 20 mg by mouth daily.    Marland Kitchen CLOPIDOGREL BISULFATE 75 MG PO TABS Oral Take 75 mg by mouth daily.      . OMEGA-3 FATTY ACIDS 1000 MG PO CAPS Oral Take 1 g by mouth 2 (two) times daily.     . FUROSEMIDE 40 MG PO TABS Oral  Take 40 mg by mouth daily.      . INSULIN ASPART 100 UNIT/ML [place] SOLN Subcutaneous Inject 50 Units into the skin 3 (three) times daily before meals.      . INSULIN GLARGINE 100 UNIT/ML [place] SOLN Subcutaneous Inject 65-70 Units into the skin 2 (two) times daily. Take 65 units in the morning and 70 units at bedtime     . LISINOPRIL 20 MG PO TABS Oral Take 20 mg by mouth daily.      Marland Kitchen METOPROLOL TARTRATE 50 MG PO TABS Oral Take 50 mg by mouth 2 (two) times daily.      Marland Kitchen PRAVASTATIN SODIUM 40 MG PO TABS Oral Take 40 mg by mouth at bedtime.       BP 148/61  Pulse 71  Temp(Src) 98 F (36.7 C) (Oral)  Resp 20  Ht 5\' 3"  (1.6 m)  Wt 308 lb (139.708 kg)  BMI 54.56 kg/m2  SpO2 96%  Physical Exam  Nursing note and vitals reviewed. Constitutional: She is oriented to person, place, and time. She appears well-developed and well-nourished. No distress.  HENT:  Head: Normocephalic and atraumatic.  Eyes: EOM are normal. Pupils are equal, round, and reactive to light.  Neck: Normal range of motion.  Cardiovascular: Normal rate, regular rhythm, normal heart sounds and intact distal pulses.   No murmur heard. Pulmonary/Chest: Effort normal and breath sounds normal.  Musculoskeletal: She exhibits tenderness. She exhibits no edema.       Left shoulder: She exhibits decreased range of motion, tenderness and bony tenderness. She exhibits no swelling, no effusion, no crepitus, no deformity, no laceration, no spasm, normal pulse and normal strength.       Arms:      ttp of the left anterior shoulder and deltoid.  Pan reproduced with attempted abduction of the arm or rotation of the shoulder.  Radial pulse is brisk, sensation distally is intact, CR< 2 sec, grip strength is strong and equal bilaterally  Neurological: She is alert and oriented to person, place, and time. She exhibits normal muscle tone. Coordination normal.  Skin: Skin is warm and dry.    ED Course  Procedures (including critical care  time)  Labs Reviewed - No data to display Dg Shoulder Left  07/24/2011  *RADIOLOGY REPORT*  Clinical Data: 46 year old female with pain after fall 1 week ago.  LEFT SHOULDER - 2+ VIEW  Comparison: Chest radiograph 02/18/2011 and earlier.  Findings: Calcification in the region of the rotator cuff. No glenohumeral joint dislocation.  Proximal left humerus, clavicle and scapula appear intact.  There is also some heterotopic calcification adjacent to the left coracoid.  The coracoclavicular interval appears appropriate.  Visualized left ribs and lung parenchyma are within normal limits.  IMPRESSION:  1. No acute fracture or dislocation identified about the left shoulder. 2.  Calcification at the rotator cuff insertion and about the coracoid probably is degenerative in nature.  Original Report Authenticated By: Harley Hallmark, M.D.     Sling applied by the nursing staff, pain improved, remains NV intact.   MDM   Previous medical charts, nursing notes and vitals signs from this visit were reviewed by me   All laboratory results and/or imaging results performed on this visit, if applicable, were reviewed by me and discussed with the patient and/or parent as well as recommendation for follow-up    MEDICATIONS GIVEN IN ED:  percocet  Pt has ttp of the anterolateral left shoulder joint.  Pain with abduction or rotation of the shoulder.  Concerning for possible rotator cuff injury.  Will apply sling and pt agrees to close ortho f/u with Dr. Romeo Apple or Dr. Hilda Lias next week    PRESCRIPTIONS GIVEN AT DISCHARGE:  Percocet #20, naprosyn   Pt stable in ED with no significant deterioration in condition. Pt feels improved after observation and/or treatment in ED. Patient / Family / Caregiver understand and agree with initial ED impression and plan with expectations set for ED visit.  Patient agrees to return to ED for any worsening symptoms         Tiajuana Leppanen L. Shatavia Santor, [place] 07/28/11 1658

## 2011-07-30 NOTE — ED Provider Notes (Signed)
Medical screening examination/treatment/procedure(s) were performed by non-physician practitioner and as supervising physician I was immediately available for consultation/collaboration.   Kaydyn Sayas W. Zury Fazzino, MD 07/30/11 2315 

## 2011-08-04 ENCOUNTER — Encounter: Payer: Self-pay | Admitting: Cardiology

## 2011-08-10 ENCOUNTER — Encounter (HOSPITAL_COMMUNITY): Payer: Self-pay

## 2011-08-10 ENCOUNTER — Emergency Department (HOSPITAL_COMMUNITY): Payer: Self-pay

## 2011-08-10 ENCOUNTER — Emergency Department (HOSPITAL_COMMUNITY)
Admission: EM | Admit: 2011-08-10 | Discharge: 2011-08-10 | Disposition: A | Discharge status: Home / Self Care | Payer: Self-pay | Attending: Emergency Medicine | Admitting: Emergency Medicine

## 2011-08-10 DIAGNOSIS — IMO0002 Reserved for concepts with insufficient information to code with codable children: Secondary | ICD-10-CM | POA: Insufficient documentation

## 2011-08-10 DIAGNOSIS — I2510 Atherosclerotic heart disease of native coronary artery without angina pectoris: Secondary | ICD-10-CM | POA: Insufficient documentation

## 2011-08-10 DIAGNOSIS — T148XXA Other injury of unspecified body region, initial encounter: Secondary | ICD-10-CM

## 2011-08-10 DIAGNOSIS — Z91038 Other insect allergy status: Secondary | ICD-10-CM | POA: Insufficient documentation

## 2011-08-10 DIAGNOSIS — X58XXXA Exposure to other specified factors, initial encounter: Secondary | ICD-10-CM | POA: Insufficient documentation

## 2011-08-10 DIAGNOSIS — J4489 Other specified chronic obstructive pulmonary disease: Secondary | ICD-10-CM | POA: Insufficient documentation

## 2011-08-10 DIAGNOSIS — Z794 Long term (current) use of insulin: Secondary | ICD-10-CM | POA: Insufficient documentation

## 2011-08-10 DIAGNOSIS — E669 Obesity, unspecified: Secondary | ICD-10-CM | POA: Insufficient documentation

## 2011-08-10 DIAGNOSIS — E785 Hyperlipidemia, unspecified: Secondary | ICD-10-CM | POA: Insufficient documentation

## 2011-08-10 DIAGNOSIS — Z87891 Personal history of nicotine dependence: Secondary | ICD-10-CM | POA: Insufficient documentation

## 2011-08-10 DIAGNOSIS — E119 Type 2 diabetes mellitus without complications: Secondary | ICD-10-CM | POA: Insufficient documentation

## 2011-08-10 DIAGNOSIS — M25569 Pain in unspecified knee: Secondary | ICD-10-CM

## 2011-08-10 DIAGNOSIS — J449 Chronic obstructive pulmonary disease, unspecified: Secondary | ICD-10-CM | POA: Insufficient documentation

## 2011-08-10 DIAGNOSIS — I10 Essential (primary) hypertension: Secondary | ICD-10-CM | POA: Insufficient documentation

## 2011-08-10 MED ORDER — BACITRACIN ZINC 500 UNIT/GM EX OINT
TOPICAL_OINTMENT | CUTANEOUS | Status: AC
  Administered 2011-08-10: 19:00:00
  Filled 2011-08-10: qty 0.9

## 2011-08-10 MED ORDER — OXYCODONE-ACETAMINOPHEN 5-325 MG PO TABS: 1.0000 | ORAL_TABLET | ORAL | Status: AC | PRN

## 2011-08-10 MED ORDER — SULFAMETHOXAZOLE-TRIMETHOPRIM 800-160 MG PO TABS: 1.0000 | ORAL_TABLET | Freq: Two times a day (BID) | ORAL | Status: AC

## 2011-08-10 NOTE — ED Provider Notes (Signed)
History     CSN: [id number]  Arrival date & time 08/10/11  1604   First MD Initiated Contact with Patient 08/10/11 1627      Chief Complaint  Patient presents with  . Knee Pain    (Consider location/radiation/quality/duration/timing/severity/associated sxs/prior treatment) HPI Comments: Patient c/o pain, swelling and inability to bear weight to the right lower leg.  Patient [place]sunk in the mud" causing her to have scratches to her medial right knee.  Since that time she has developed increasing pain and swelling.  States that she is now unable to bear weight to the right knee without severe pain.  Has been using wheelchair.  She denies fall, numbness of her leg, weakness or hip pain.    Patient is a 46 y.o. female presenting with knee pain. The history is provided by the patient.  Knee Pain This is a new problem. The current episode started in the past 7 days. The problem occurs constantly. The problem has been gradually worsening. Associated symptoms include arthralgias and joint swelling. Pertinent negatives include no chills, fever, headaches, nausea, neck pain, numbness, rash, vomiting or weakness. The symptoms are aggravated by standing, twisting and walking. She has tried nothing for the symptoms. The treatment provided no relief.    Past Medical History  Diagnosis Date  . Essential hypertension, benign   . Type 2 diabetes mellitus   . Morbid obesity   . Dyslipidemia   . Coronary atherosclerosis of native coronary artery     Diagnosed [place] 2006 - DES RCA, reports followup cath 2009 at [facility]   . COPD (chronic obstructive pulmonary disease)   . CAP (community acquired pneumonia)     Streptococcus 01/2011  . Pancreatitis     Past Surgical History  Procedure Date  . Abdominal hysterectomy   . Tonsillectomy   . Knee surgery   . Pilonidal cystectomy   . Nose surgery   . Abdominal hernia repair       Family History  Problem Relation Age of Onset  . Diabetes Mother   . Heart failure Mother   . Hypertension Mother   . Kidney failure Mother     History  Substance Use Topics  . Smoking status: Former Smoker -- 1.5 packs/day for 20 years    Types: Cigarettes    Quit date: 08/11/2004  . Smokeless tobacco: Never Used  . Alcohol Use: No    OB History    Grav Para Term Preterm Abortions TAB SAB Ect Mult Living                  Review of Systems  Constitutional: Negative for fever and chills.  HENT: Negative for neck pain.   Gastrointestinal: Negative for nausea and vomiting.  Genitourinary: Negative for dysuria and difficulty urinating.  Musculoskeletal: Positive for joint swelling, arthralgias and gait problem. Negative for back pain.  Skin: Positive for wound. Negative for color change and rash.       Abrasions to the knee  Neurological: Negative for weakness, numbness and headaches.  All other systems reviewed and are negative.    Allergies  Aspirin; Bee venom; Food; and E-mycin  Home Medications   Current Outpatient Rx  Name Route Sig Dispense Refill  . ALBUTEROL SULFATE HFA 108 (90 BASE) MCG/ACT IN AERS Inhalation Inhale 2 puffs into the lungs as needed. Shortness of breath    . CITALOPRAM HYDROBROMIDE 20 MG PO  TABS Oral Take 20 mg by mouth daily.    Marland Kitchen CLOPIDOGREL BISULFATE 75 MG PO TABS Oral Take 75 mg by mouth daily.      . OMEGA-3 FATTY ACIDS 1000 MG PO CAPS Oral Take 1 g by mouth 2 (two) times daily.     . FUROSEMIDE 40 MG PO TABS Oral Take 40 mg by mouth daily.      . INSULIN ASPART 100 UNIT/ML [place] SOLN Subcutaneous Inject 50 Units into the skin 3 (three) times daily before meals.      . INSULIN GLARGINE 100 UNIT/ML [place] SOLN Subcutaneous Inject 65-70 Units into the skin 2 (two) times daily. Take 65 units in the morning and 70 units at bedtime     . LISINOPRIL 20 MG PO TABS Oral Take 20 mg by mouth daily.      Marland Kitchen METOPROLOL TARTRATE 50 MG PO TABS Oral Take 50  mg by mouth 2 (two) times daily.      Marland Kitchen PRAVASTATIN SODIUM 40 MG PO TABS Oral Take 40 mg by mouth at bedtime.       BP 152/75  Pulse 77  Temp 98.3 F (36.8 C) (Oral)  Resp 20  Ht 5\' 3"  (1.6 m)  Wt 315 lb (142.883 kg)  BMI 55.80 kg/m2  SpO2 96%  Physical Exam  Nursing note and vitals reviewed. Constitutional: She is oriented to person, place, and time. She appears well-developed and well-nourished. No distress.       Patient is obese  HENT:  Head: Normocephalic and atraumatic.  Cardiovascular: Normal rate, regular rhythm, normal heart sounds and intact distal pulses.   Pulmonary/Chest: Effort normal and breath sounds normal.  Musculoskeletal: She exhibits edema and tenderness.       Right knee: She exhibits decreased range of motion, swelling and erythema. She exhibits no effusion and no ecchymosis. tenderness found. Medial joint line tenderness noted. No patellar tendon tenderness noted.       Legs:      ttp of the medial right knee, healing abrasions to the medial knee, mild erythema distal to the wound with moderate STS present with ttp to posterior calf .  Mild edema also present to the left LE.  DP pulse is brisk, distal sensation intact.  Neurological: She is alert and oriented to person, place, and time. She exhibits normal muscle tone. Coordination normal.  Skin: Skin is warm and dry. No erythema.       See MS exam    ED Course  Procedures (including critical care time)  Labs Reviewed - No data to display  US Venous Img Lower Unilateral Right  08/10/2011  *RADIOLOGY REPORT*  Clinical data:  Recent trauma to the right lower extremity, now with swelling.  RIGHT LOWER EXTREMITY VENOUS DOPPLER ULTRASOUND 08/10/2011.  Technique:  Gray-scale sonography with compression, as well as color and duplex Doppler ultrasound, were performed to evaluate the deep venous system from the level of the common femoral vein through the popliteal and proximal calf veins. Evaluation also included  physiologic response to augmentation.  Comparison: None.  Findings: All of the visualized deep veins in the right lower extremity demonstrate normal Doppler signal, normal compressibility, normal phasicity, and normal physiologic response to augmentation.  No gray scale filling defects were identified.  IMPRESSION: No evidence of right lower extremity DVT.  Original Report Authenticated By: Arnell Sieving, M.D.     Abrasions were bandaged. Knee immobilizer applied, pain improved, remains NV intact.     MDM  Consulted Dr. Hilda Lias.  He agrees to see pt in his office for f/u.     Patient / Family / Caregiver understand and agree with initial ED impression and plan with expectations set for ED visit. Pt stable in ED with no significant deterioration in condition. Pt feels improved after observation and/or treatment in ED.    The patient appears reasonably screened and/or stabilized for discharge and I doubt any other medical condition or other [facility] requiring further screening, evaluation, or treatment in the ED at this time prior to discharge.    Aldeen Riga L. Anderson, [place] 08/10/11 1838

## 2011-08-10 NOTE — ED Provider Notes (Signed)
Medical screening examination/treatment/procedure(s) were performed by non-physician practitioner and as supervising physician I was immediately available for consultation/collaboration.  Shelda Jakes, MD 08/10/11 [phone]

## 2011-08-10 NOTE — ED Notes (Signed)
[place] foot [place] into some mud and dirt.  PT has scratches to r lower leg.  Pt says hurts to walk.  Lower legs red and swollen but says is normal for her.

## 2011-08-10 NOTE — Discharge Instructions (Signed)
Abrasions  Abrasions are skin scrapes. Their treatment depends on how large and deep the abrasion is. Abrasions do not extend through all layers of the skin. A cut or lesion through all skin layers is called a laceration.  HOME CARE INSTRUCTIONS   · If you were given a dressing, change it at least once a day or as instructed by your caregiver. If the bandage sticks, soak it off with a solution of water or hydrogen peroxide.  · Twice a day, wash the area with soap and water to remove all the cream/ointment. You may do this in a sink, under a tub faucet, or in a shower. Rinse off the soap and pat dry with a clean towel. Look for signs of infection (see below).  · Reapply cream/ointment according to your caregiver's instruction. This will help prevent infection and keep the bandage from sticking. Telfa or gauze over the wound and under the dressing or wrap will also help keep the bandage from sticking.  · If the bandage becomes wet, dirty, or develops a foul smell, change it as soon as possible.  · Only take over-the-counter or prescription medicines for pain, discomfort, or fever as directed by your caregiver.  SEEK IMMEDIATE MEDICAL CARE IF:   · Increasing pain in the wound.  · Signs of infection develop: redness, swelling, surrounding area is tender to touch, or pus coming from the wound.  · You have a fever.  · Any foul smell coming from the wound or dressing.  Most skin wounds heal within ten days. Facial wounds heal faster. However, an infection may occur despite proper treatment. You should have the wound checked for signs of infection within 24 to 48 hours or sooner if problems arise. If you were not given a wound-check appointment, look closely at the wound yourself on the second day for early signs of infection listed above.  MAKE SURE YOU:   · Understand these instructions.  · Will watch your condition.  · Will get help right away if you are not doing well or get worse.  Document Released: 11/18/2004  Document Revised: 01/28/2011 Document Reviewed: 01/12/2011  Exit[employer]® Patient Information ©2012 [employer].    Knee Pain  The knee is the complex joint between your thigh and your lower leg. It is made up of bones, tendons, ligaments, and cartilage. The bones that make up the knee are:  · The femur in the thigh.  · The tibia and fibula in the lower leg.  · The patella or kneecap riding in the groove on the lower femur.  CAUSES   Knee pain is a common complaint with many causes. A few of these causes are:  · Injury, such as:  · A ruptured ligament or tendon injury.  · Torn cartilage.  · Medical conditions, such as:  · Gout  · Arthritis  · Infections  · Overuse, over training or overdoing a physical activity.  Knee pain can be minor or severe. Knee pain can accompany debilitating injury. Minor knee problems often respond well to self-care measures or get well on their own. More serious injuries may need medical intervention or even surgery.  SYMPTOMS  The knee is complex. Symptoms of knee problems can vary widely. Some of the problems are:  · Pain with movement and weight bearing.  · Swelling and tenderness.  · Buckling of the knee.  · Inability to straighten or extend your knee.  · Your knee locks and you cannot straighten it.  ·   Warmth and redness with pain and fever.  · Deformity or dislocation of the kneecap.  DIAGNOSIS   Determining what is wrong may be very straight forward such as when there is an injury. It can also be challenging because of the complexity of the knee. Tests to make a diagnosis may include:  · Your caregiver taking a history and doing a physical exam.  · Routine X-rays can be used to rule out other problems. X-rays will not reveal a cartilage tear. Some injuries of the knee can be diagnosed by:  · Arthroscopy a surgical technique by which a small video camera is inserted through tiny incisions on the sides of the knee. This procedure is used to examine and repair internal knee joint  problems. Tiny instruments can be used during arthroscopy to repair the torn knee cartilage (meniscus).  · Arthrography is a radiology technique. A contrast liquid is directly injected into the knee joint. Internal structures of the knee joint then become visible on X-ray film.  · An MRI scan is a non x-ray radiology procedure in which magnetic fields and a computer produce two- or three-dimensional images of the inside of the knee. Cartilage tears are often visible using an MRI scanner. MRI scans have largely replaced arthrography in diagnosing cartilage tears of the knee.  · Blood work.  · Examination of the fluid that helps to lubricate the knee joint (synovial fluid). This is done by taking a sample out using a needle and a syringe.  TREATMENT  The treatment of knee problems depends on the cause. Some of these treatments are:  · Depending on the injury, proper casting, splinting, surgery or physical therapy care will be needed.  · Give yourself adequate recovery time. Do not overuse your joints. If you begin to get sore during workout routines, back off. Slow down or do fewer repetitions.  · For repetitive activities such as cycling or running, maintain your strength and nutrition.  · Alternate muscle groups. For example if you are a weight lifter, work the upper body on one day and the lower body the next.  · Either tight or weak muscles do not give the proper support for your knee. Tight or weak muscles do not absorb the stress placed on the knee joint. Keep the muscles surrounding the knee strong.  · Take care of mechanical problems.  · If you have flat feet, orthotics or special shoes may help. See your caregiver if you need help.  · Arch supports, sometimes with wedges on the inner or outer aspect of the heel, can help. These can shift pressure away from the side of the knee most bothered by osteoarthritis.  · A brace called an "unloader" brace also may be used to help ease the pressure on the most  arthritic side of the knee.  · If your caregiver has prescribed crutches, braces, wraps or ice, use as directed. The acronym for this is PRICE. This means protection, rest, ice, compression and elevation.  · Nonsteroidal anti-inflammatory drugs (NSAID's), can help relieve pain. But if taken immediately after an injury, they may actually increase swelling. Take NSAID's with food in your stomach. Stop them if you develop stomach problems. Do not take these if you have a history of ulcers, stomach pain or bleeding from the bowel. Do not take without your caregiver's approval if you have problems with fluid retention, heart failure, or kidney problems.  · For ongoing knee problems, physical therapy may be helpful.  · Glucosamine   and chondroitin are over-the-counter dietary supplements. Both may help relieve the pain of osteoarthritis in the knee. These medicines are different from the usual anti-inflammatory drugs. Glucosamine may decrease the rate of cartilage destruction.  · Injections of a corticosteroid drug into your knee joint may help reduce the symptoms of an arthritis flare-up. They may provide pain relief that lasts a few months. You may have to wait a few months between injections. The injections do have a small increased risk of infection, water retention and elevated blood sugar levels.  · Hyaluronic acid injected into damaged joints may ease pain and provide lubrication. These injections may work by reducing inflammation. A series of shots may give relief for as long as 6 months.  · Topical painkillers. Applying certain ointments to your skin may help relieve the pain and stiffness of osteoarthritis. Ask your pharmacist for suggestions. Many over the-counter products are approved for temporary relief of arthritis pain.  · In some countries, doctors often prescribe topical NSAID's for relief of chronic conditions such as arthritis and tendinitis. A review of treatment with NSAID creams found that they  worked as well as oral medications but without the serious side effects.  PREVENTION  · Maintain a healthy weight. Extra pounds put more strain on your joints.  · Get strong, stay limber. Weak muscles are a common cause of knee injuries. Stretching is important. Include flexibility exercises in your workouts.  · Be smart about exercise. If you have osteoarthritis, chronic knee pain or recurring injuries, you may need to change the way you exercise. This does not mean you have to stop being active. If your knees ache after jogging or playing basketball, consider switching to swimming, water aerobics or other low-impact activities, at least for a few days a week. Sometimes limiting high-impact activities will provide relief.  · Make sure your shoes fit well. Choose footwear that is right for your sport.  · Protect your knees. Use the proper gear for knee-sensitive activities. Use kneepads when playing volleyball or laying carpet. Buckle your seat belt every time you drive. Most shattered kneecaps occur in car accidents.  · Rest when you are tired.  SEEK MEDICAL CARE IF:   You have knee pain that is continual and does not seem to be getting better.   SEEK IMMEDIATE MEDICAL CARE IF:   Your knee joint feels hot to the touch and you have a high fever.  MAKE SURE YOU:   · Understand these instructions.  · Will watch your condition.  · Will get help right away if you are not doing well or get worse.  Document Released: 12/06/2006 Document Revised: 01/28/2011 Document Reviewed: 12/06/2006  Exit[employer]® Patient Information ©2012 [employer].

## 2011-11-09 ENCOUNTER — Encounter (HOSPITAL_COMMUNITY): Payer: Self-pay | Admitting: *Deleted

## 2011-11-09 ENCOUNTER — Other Ambulatory Visit: Payer: Self-pay

## 2011-11-09 ENCOUNTER — Inpatient Hospital Stay (HOSPITAL_COMMUNITY)
Admission: EM | Admit: 2011-11-09 | Discharge: 2011-11-12 | DRG: 190 | Disposition: A | Discharge status: Home / Self Care | Payer: Medicaid Other | Attending: Internal Medicine | Admitting: Internal Medicine

## 2011-11-09 ENCOUNTER — Emergency Department (HOSPITAL_COMMUNITY): Payer: Medicaid Other

## 2011-11-09 DIAGNOSIS — R0600 Dyspnea, unspecified: Secondary | ICD-10-CM | POA: Diagnosis present

## 2011-11-09 DIAGNOSIS — Z6841 Body Mass Index (BMI) 40.0 and over, adult: Secondary | ICD-10-CM

## 2011-11-09 DIAGNOSIS — R5381 Other malaise: Secondary | ICD-10-CM | POA: Diagnosis present

## 2011-11-09 DIAGNOSIS — E1169 Type 2 diabetes mellitus with other specified complication: Secondary | ICD-10-CM | POA: Diagnosis present

## 2011-11-09 DIAGNOSIS — Z794 Long term (current) use of insulin: Secondary | ICD-10-CM

## 2011-11-09 DIAGNOSIS — E669 Obesity, unspecified: Secondary | ICD-10-CM

## 2011-11-09 DIAGNOSIS — Z9861 Coronary angioplasty status: Secondary | ICD-10-CM

## 2011-11-09 DIAGNOSIS — J4489 Other specified chronic obstructive pulmonary disease: Principal | ICD-10-CM | POA: Diagnosis present

## 2011-11-09 DIAGNOSIS — E119 Type 2 diabetes mellitus without complications: Secondary | ICD-10-CM | POA: Diagnosis present

## 2011-11-09 DIAGNOSIS — I5031 Acute diastolic (congestive) heart failure: Secondary | ICD-10-CM | POA: Diagnosis present

## 2011-11-09 DIAGNOSIS — I509 Heart failure, unspecified: Secondary | ICD-10-CM | POA: Diagnosis present

## 2011-11-09 DIAGNOSIS — J449 Chronic obstructive pulmonary disease, unspecified: Principal | ICD-10-CM | POA: Diagnosis present

## 2011-11-09 DIAGNOSIS — I10 Essential (primary) hypertension: Secondary | ICD-10-CM | POA: Diagnosis present

## 2011-11-09 DIAGNOSIS — Z79899 Other long term (current) drug therapy: Secondary | ICD-10-CM

## 2011-11-09 DIAGNOSIS — D72829 Elevated white blood cell count, unspecified: Secondary | ICD-10-CM | POA: Diagnosis present

## 2011-11-09 DIAGNOSIS — I2510 Atherosclerotic heart disease of native coronary artery without angina pectoris: Secondary | ICD-10-CM | POA: Diagnosis present

## 2011-11-09 DIAGNOSIS — Z8701 Personal history of pneumonia (recurrent): Secondary | ICD-10-CM

## 2011-11-09 DIAGNOSIS — R0609 Other forms of dyspnea: Secondary | ICD-10-CM

## 2011-11-09 DIAGNOSIS — R0989 Other specified symptoms and signs involving the circulatory and respiratory systems: Secondary | ICD-10-CM

## 2011-11-09 DIAGNOSIS — Z87891 Personal history of nicotine dependence: Secondary | ICD-10-CM

## 2011-11-09 DIAGNOSIS — I5033 Acute on chronic diastolic (congestive) heart failure: Secondary | ICD-10-CM | POA: Diagnosis present

## 2011-11-09 LAB — CBC WITH DIFFERENTIAL/PLATELET
Basophils Absolute: 0 10*3/uL (ref 0.0–0.1)
Basophils Relative: 0 % (ref 0–1)
HCT: 36.3 % (ref 36.0–46.0)
Hemoglobin: 11.9 g/dL — ABNORMAL LOW (ref 12.0–15.0)
Lymphs Abs: 3.3 10*3/uL (ref 0.7–4.0)
MCV: 91.2 fL (ref 78.0–100.0)
Monocytes Relative: 6 % (ref 3–12)
Neutro Abs: 9.4 10*3/uL — ABNORMAL HIGH (ref 1.7–7.7)
RDW: 15.1 % (ref 11.5–15.5)
WBC: 13.9 10*3/uL — ABNORMAL HIGH (ref 4.0–10.5)

## 2011-11-09 LAB — BASIC METABOLIC PANEL
BUN: 16 mg/dL (ref 6–23)
CO2: 29 mEq/L (ref 19–32)
Calcium: 9.3 mg/dL (ref 8.4–10.5)
Chloride: 99 mEq/L (ref 96–112)
Creatinine, Ser: 0.69 mg/dL (ref 0.50–1.10)
Glucose, Bld: 128 mg/dL — ABNORMAL HIGH (ref 70–99)

## 2011-11-09 MED ORDER — NITROGLYCERIN IN D5W 200-5 MCG/ML-% IV SOLN
5.0000 ug/min | INTRAVENOUS | Status: DC
  Administered 2011-11-09: 5 ug/min via INTRAVENOUS
  Filled 2011-11-09: qty 250

## 2011-11-09 MED ORDER — SODIUM CHLORIDE 0.9 % IN NEBU
INHALATION_SOLUTION | RESPIRATORY_TRACT | Status: AC
  Administered 2011-11-09: 3 mL
  Filled 2011-11-09: qty 3

## 2011-11-09 MED ORDER — ALBUTEROL SULFATE (5 MG/ML) 0.5% IN NEBU
5.0000 mg | INHALATION_SOLUTION | Freq: Once | RESPIRATORY_TRACT | Status: AC
Start: 2011-11-09 — End: 2011-11-09
  Administered 2011-11-09: 5 mg via RESPIRATORY_TRACT
  Filled 2011-11-09: qty 1

## 2011-11-09 MED ORDER — FUROSEMIDE 10 MG/ML IJ SOLN
40.0000 mg | Freq: Once | INTRAMUSCULAR | Status: AC
  Administered 2011-11-09: 40 mg via INTRAVENOUS
  Filled 2011-11-09: qty 4

## 2011-11-09 NOTE — ED Provider Notes (Signed)
History   This chart was scribed for [employer]. Rubin Payor, MD by Bennett Scrape. This patient was seen in room APA18/APA18 and the patient's care was started at 2137.  CSN: [id number]  Arrival date & time 11/09/11  2038   First MD Initiated Contact with Patient 11/09/11 2137      Chief Complaint  Patient presents with  . Chest Pain  . Shortness of Breath     The history is provided by the patient. No language interpreter was used.    Nicole [name] is a 46 y.o. female with a h/o COPD who presents to the Emergency Department complaining of gradual onset, non-changing, constant non-radiating chest pain with associated SOB and lightheadedness that started with she was arguing with her daughter and niece that began 20 minutes PTA.  Pt reports that she has been experiencing SOB constantly for the past for three to four weeks. She also reports coughing at night as well as increased leg swelling. She confirms that she has had one prior episode of similar symptoms for which she was hospitalized at the end of December 2012. She states that the episode was diagnosed as fluid on the lungs. She denies fever, neck pain, sore throat, visual disturbance, abdominal pain, nausea, emesis, diarrhea, urinary symptoms, back pain, HA, weakness, numbness and rash as associated symptoms.  She has a h/o a cardiac stent placement 7 years ago, DM, HTN and dyslipidemia. She is a former smoker but denies alcohol use.   Past Medical History  Diagnosis Date  . Essential hypertension, benign   . Type 2 diabetes mellitus   . Morbid obesity   . Dyslipidemia   . Coronary atherosclerosis of native coronary artery     Diagnosed [place] 2006 - DES RCA, reports followup cath 2009 at [facility]   . COPD (chronic obstructive pulmonary disease)   . CAP (community acquired pneumonia)     Streptococcus 01/2011  . Pancreatitis     Past Surgical History  Procedure Date  . Abdominal hysterectomy   . Tonsillectomy   . Knee  surgery   . Pilonidal cystectomy   . Nose surgery   . Abdominal hernia repair   . Coronary angioplasty with stent placement 2006    Family History  Problem Relation Age of Onset  . Diabetes Mother   . Heart failure Mother   . Hypertension Mother   . Kidney failure Mother     History  Substance Use Topics  . Smoking status: Former Smoker -- 20 years    Types: Cigarettes    Quit date: 08/11/2004  . Smokeless tobacco: Never Used  . Alcohol Use: No    No OB history provided.  Review of Systems  Constitutional: Negative for chills.       Per HPI, otherwise negative  HENT:       Per HPI, otherwise negative  Eyes: Negative.   Respiratory:       Per HPI, otherwise negative  Cardiovascular:       Per HPI, otherwise negative  Gastrointestinal: Negative for vomiting.  Genitourinary: Negative.   Musculoskeletal:       Per HPI, otherwise negative  Skin: Negative.   Neurological: Positive for light-headedness. Negative for syncope.    Allergies  Aspirin; Bee venom; Food; and E-mycin  Home Medications   Current Outpatient Rx  Name Route Sig Dispense Refill  . ALBUTEROL SULFATE HFA 108 (90 BASE) MCG/ACT IN AERS Inhalation Inhale 2 puffs into the lungs as needed. Shortness of breath    .  CITALOPRAM HYDROBROMIDE 20 MG PO TABS Oral Take 20 mg by mouth daily.    Marland Kitchen CLOPIDOGREL BISULFATE 75 MG PO TABS Oral Take 75 mg by mouth daily.      . OMEGA-3 FATTY ACIDS 1000 MG PO CAPS Oral Take 1 g by mouth 2 (two) times daily.     . FUROSEMIDE 40 MG PO TABS Oral Take 40 mg by mouth daily.      . INSULIN ASPART 100 UNIT/ML Tensed SOLN Subcutaneous Inject 50 Units into the skin 3 (three) times daily before meals.      . INSULIN GLARGINE 100 UNIT/ML [place] SOLN Subcutaneous Inject 70-75 Units into the skin 2 (two) times daily. Take70 units in the morning and 75 units at bedtime    . LISINOPRIL 20 MG PO TABS Oral Take 20 mg by mouth daily.      Marland Kitchen METOPROLOL TARTRATE 50 MG PO TABS Oral Take 50 mg by  mouth 2 (two) times daily.      Marland Kitchen PRAVASTATIN SODIUM 40 MG PO TABS Oral Take 40 mg by mouth at bedtime.       Triage Vitals: BP 173/44  Pulse 80  Temp 98.2 F (36.8 C) (Oral)  Resp 22  SpO2 96%  Physical Exam  Nursing note and vitals reviewed. Constitutional: She is oriented to person, place, and time. She appears well-developed and well-nourished. No distress.       Obese  HENT:  Head: Normocephalic and atraumatic.  Eyes: Conjunctivae normal and EOM are normal.  Neck: Neck supple. No tracheal deviation present.  Cardiovascular: Normal rate.   Pulmonary/Chest: She is in respiratory distress. She has rales.       Mild respiratory distress, tachypneic, diffuse wheezes, mild bilateral rales in both bases.   Abdominal: Soft. There is no tenderness.  Musculoskeletal: Normal range of motion. She exhibits edema.       Pitting edema in lower extremities   Neurological: She is alert and oriented to person, place, and time.  Skin: Skin is warm and dry.  Psychiatric: She has a normal mood and affect. Her behavior is normal.    ED Course  Procedures (including critical care time)  DIAGNOSTIC STUDIES: Oxygen Saturation is 98% on room air, normal by my interpretation.    COORDINATION OF CARE: 2150-Informed pt that her EKG is abnormal but stable. Discussed treatment plan which includes a CXR and blood work with pt at bedside and pt agreed to plan. Advised pt that admission is possible.  2200-Ordered 0.9% sodium chloride, 5 mg/mL albuterol, 0.2 mg/mL nitroglycerin and 40 mg lasix.  2251-Discussed pt's case with Dr. Onalee Hua and he agreed to admit the pt to team [facility] at [facility]. Call ended at 2253.  Labs Reviewed  CBC WITH DIFFERENTIAL - Abnormal; Notable for the following:    WBC 13.9 (*)     Hemoglobin 11.9 (*)     Neutro Abs 9.4 (*)     All other components within normal limits  BASIC METABOLIC PANEL - Abnormal; Notable for the following:    Glucose, Bld 128 (*)     All other  components within normal limits  PRO B NATRIURETIC PEPTIDE - Abnormal; Notable for the following:    Pro B Natriuretic peptide (BNP) 188.7 (*)     All other components within normal limits  TROPONIN I   Dg Chest Portable 1 View  11/09/2011  *RADIOLOGY REPORT*  Clinical Data: Chest pain shortness of breath.  Former smoker. COPD.  Diabetes.  PORTABLE  CHEST - 1 VIEW  Comparison: 02/18/2011.  Findings: Cardiomegaly.  Interstitial and alveolar prominence consistent with pulmonary edema.  No pneumothorax.  Degenerative change thoracic spine.  Worsening appearance from priors.  IMPRESSION: Cardiomegaly.  Pulmonary edema.  Worsening aeration.   Original Report Authenticated By: Elsie Stain, M.D.      1. COPD (chronic obstructive pulmonary disease)   2. CHF (congestive heart failure)     Date: 11/09/2011  Rate:84  Rhythm: normal sinus rhythm  QRS Axis: normal  Intervals: normal  ST/T Wave abnormalities: nonspecific ST/T changes  Conduction Disutrbances:right bundle branch block and left anterior fascicular block  Narrative Interpretation:   Old EKG Reviewed: unchanged     MDM  Patient with shortness of breath. History of CHF questionably and COPD. Patient's x-ray is more consistent with CHF, however her exam is more consistent with and COPD. She has some moderate dyspnea and will be admitted to medicine.  I personally performed the services described in this documentation, which was scribed in my presence. The recorded information has been reviewed and considered.        Juliet Rude. Rubin Payor, MD 11/09/11 2350

## 2011-11-09 NOTE — ED Notes (Addendum)
Pt with cp x 20 minutes ago with SOB, left arm pain and light headedness, admits to yelling at niece when CP occurred; states she just had cardiac stent 7 years ago

## 2011-11-09 NOTE — ED Notes (Signed)
Pt up to bedside commode. Tolerated fair.

## 2011-11-09 NOTE — H&P (Signed)
PCP:   [name], PA-C   Chief Complaint:  sob  HPI: 46 yo female h/o morbid obesity, iddm, copd/asthma, chronic le edema comes in with 3 weeks of worsening sob.  Denies prior h/o chf and was told she "might have it" in the past.  Has been evaluated by cards with recent lexiscan in 05/13 which was normal.  Has been on lasix but feels it does not help.  Does have chrnic le edema no significant change.  No fever or cough.  Some pnd.  Snores a lot at night per family members and has never been evaluated for sleep apnea.  Denies any wheezing.  No tobacco abuse.  No cp.  Asked to admit for possible chf exac.  Review of Systems:  O/w neg  Past Medical History: Past Medical History  Diagnosis Date  . Essential hypertension, benign   . Type 2 diabetes mellitus   . Morbid obesity   . Dyslipidemia   . Coronary atherosclerosis of native coronary artery     Diagnosed [place] 2006 - DES RCA, reports followup cath 2009 at [facility]   . COPD (chronic obstructive pulmonary disease)   . CAP (community acquired pneumonia)     Streptococcus 01/2011  . Pancreatitis    Past Surgical History  Procedure Date  . Abdominal hysterectomy   . Tonsillectomy   . Knee surgery   . Pilonidal cystectomy   . Nose surgery   . Abdominal hernia repair   . Coronary angioplasty with stent placement     Medications: Prior to Admission medications   Medication Sig Start Date End Date Taking? Authorizing Provider  albuterol (VENTOLIN HFA) 108 (90 BASE) MCG/ACT inhaler Inhale 2 puffs into the lungs as needed. Shortness of breath   Yes Historical Provider, MD  citalopram (CELEXA) 20 MG tablet Take 20 mg by mouth daily.   Yes Historical Provider, MD  clopidogrel (PLAVIX) 75 MG tablet Take 75 mg by mouth daily.     Yes Historical Provider, MD  fish oil-omega-3 fatty acids 1000 MG capsule Take 1 g by mouth 2 (two) times daily.    Yes Historical Provider, MD  furosemide (LASIX) 40 MG tablet Take 40 mg by mouth  daily.     Yes Historical Provider, MD  insulin aspart (NOVOLOG) 100 UNIT/ML injection Inject 50 Units into the skin 3 (three) times daily before meals.     Yes Historical Provider, MD  insulin glargine (LANTUS) 100 UNIT/ML injection Inject 70-75 Units into the skin 2 (two) times daily. Take70 units in the morning and 75 units at bedtime   Yes Historical Provider, MD  lisinopril (PRINIVIL,ZESTRIL) 20 MG tablet Take 20 mg by mouth daily.     Yes Historical Provider, MD  metoprolol (LOPRESSOR) 50 MG tablet Take 50 mg by mouth 2 (two) times daily.     Yes Historical Provider, MD  pravastatin (PRAVACHOL) 40 MG tablet Take 40 mg by mouth at bedtime.    Yes Historical Provider, MD    Allergies:   Allergies  Allergen Reactions  . Aspirin Anaphylaxis  . Bee Venom Anaphylaxis  . Food Anaphylaxis    PINEAPPLE  . E-Mycin (Erythromycin Base) Nausea And Vomiting    Social History:  reports that she quit smoking about 7 years ago. Her smoking use included Cigarettes. She quit after 20 years of use. She has never used smokeless tobacco. She reports that she does not drink alcohol or use illicit drugs.  Family History: Family History  Problem Relation  Age of Onset  . Diabetes Mother   . Heart failure Mother   . Hypertension Mother   . Kidney failure Mother     Physical Exam: Filed Vitals:   11/09/11 2100 11/09/11 2150 11/09/11 2213 11/09/11 2302  BP: 127/94 169/64  148/53  Pulse: 80 86    Temp:      TempSrc:      Resp: 21 19  16   SpO2: 96% 98% 100% 95%   General appearance: alert, cooperative and no distress can speak in full sentences Lungs: clear to auscultation bilaterally Heart: regular rate and rhythm, S1, S2 normal, no murmur, click, rub or gallop Abdomen: soft, non-tender; bowel sounds normal; no masses,  no organomegaly Extremities: extremities normal, atraumatic, no cyanosis.   Mild ble edema. Pulses: 2+ and symmetric Skin: Skin color, texture, turgor normal. No rashes or  lesions Neurologic: Grossly normal    Labs on Admission:   [facility] 11/09/11 2140  NA 137  K 3.9  CL 99  CO2 29  GLUCOSE 128*  BUN 16  CREATININE 0.69  CALCIUM 9.3  MG --  PHOS --    Basename 11/09/11 2140  WBC 13.9*  NEUTROABS 9.4*  HGB 11.9*  HCT 36.3  MCV 91.2  PLT 295    Basename 11/09/11 2140  CKTOTAL --  CKMB --  CKMBINDEX --  TROPONINI <0.30   Radiological Exams on Admission: Dg Chest Portable 1 View  11/09/2011  *RADIOLOGY REPORT*  Clinical Data: Chest pain shortness of breath.  Former smoker. COPD.  Diabetes.  PORTABLE CHEST - 1 VIEW  Comparison: 02/18/2011.  Findings: Cardiomegaly.  Interstitial and alveolar prominence consistent with pulmonary edema.  No pneumothorax.  Degenerative change thoracic spine.  Worsening appearance from priors.  IMPRESSION: Cardiomegaly.  Pulmonary edema.  Worsening aeration.   Original Report Authenticated By: Elsie Stain, M.D.     Assessment/Plan Present on Admission:  46 yo female with acute on chronic sob likely multifactorial etiology .HTN (hypertension), benign .Dyspnea .Morbid obesity .CHF (congestive heart failure) .COPD (chronic obstructive pulmonary disease)  Pt lung exam is clear.  No wheezing.  cxr one v with pulm edema?  Repeat 2 view.  likley has underlying obs sleep apnea and needs a sleep study.  Tele overnight.  Serial cardiac enzymes.  Repeat cxr in am.  Lasix iv once given.  F/u with cardiologist as outpt.  Recent nml stress test, could have diastolic dysfunction.   Avyay Coger [phone] 11/09/2011, 11:48 PM

## 2011-11-09 NOTE — ED Notes (Signed)
Pt presents with c/o chest pain, SOB, nausea, radiating pain into left shoulder and gross edema in bilateral legs.  Pt  States was having a heated dispute with family member when chest pain began.

## 2011-11-10 ENCOUNTER — Observation Stay (HOSPITAL_COMMUNITY): Payer: Medicaid Other

## 2011-11-10 ENCOUNTER — Encounter (HOSPITAL_COMMUNITY): Payer: Self-pay

## 2011-11-10 DIAGNOSIS — I509 Heart failure, unspecified: Secondary | ICD-10-CM

## 2011-11-10 LAB — TROPONIN I
Troponin I: <0.3 ng/mL (ref <0.30)
Troponin I: <0.3 ng/mL (ref <0.30)

## 2011-11-10 LAB — BASIC METABOLIC PANEL
CO2: 32 mEq/L (ref 19–32)
Calcium: 9.1 mg/dL (ref 8.4–10.5)
GFR calc non Af Amer: >90 mL/min (ref >90)
Sodium: 140 mEq/L (ref 135–145)

## 2011-11-10 LAB — GLUCOSE, CAPILLARY: Glucose-Capillary: 146 mg/dL — ABNORMAL HIGH (ref 70–99)

## 2011-11-10 LAB — CBC
Platelets: 282 10*3/uL (ref 150–400)
RBC: 3.91 MIL/uL (ref 3.87–5.11)
WBC: 13 10*3/uL — ABNORMAL HIGH (ref 4.0–10.5)

## 2011-11-10 LAB — TSH: TSH: 4.814 u[IU]/mL — ABNORMAL HIGH (ref 0.350–4.500)

## 2011-11-10 MED ORDER — SODIUM CHLORIDE 0.9 % IJ SOLN
3.0000 mL | Freq: Two times a day (BID) | INTRAMUSCULAR | Status: DC
  Administered 2011-11-10 – 2011-11-12 (×4): 3 mL via INTRAVENOUS
  Filled 2011-11-10 (×2): qty 3

## 2011-11-10 MED ORDER — FUROSEMIDE 10 MG/ML IJ SOLN
40.0000 mg | Freq: Two times a day (BID) | INTRAMUSCULAR | Status: DC
  Administered 2011-11-10 – 2011-11-11 (×2): 40 mg via INTRAVENOUS
  Filled 2011-11-10 (×2): qty 4

## 2011-11-10 MED ORDER — CITALOPRAM HYDROBROMIDE 20 MG PO TABS
20.0000 mg | ORAL_TABLET | Freq: Every day | ORAL | Status: DC
  Administered 2011-11-10 – 2011-11-12 (×3): 20 mg via ORAL
  Filled 2011-11-10 (×3): qty 1

## 2011-11-10 MED ORDER — INSULIN GLARGINE 100 UNIT/ML ~~LOC~~ SOLN
70.0000 [IU] | Freq: Every day | SUBCUTANEOUS | Status: DC
  Administered 2011-11-10 – 2011-11-12 (×3): 70 [IU] via SUBCUTANEOUS

## 2011-11-10 MED ORDER — SODIUM CHLORIDE 0.9 % IV SOLN: 250.0000 mL | INTRAVENOUS | Status: DC | PRN

## 2011-11-10 MED ORDER — INSULIN GLARGINE 100 UNIT/ML ~~LOC~~ SOLN
75.0000 [IU] | Freq: Every day | SUBCUTANEOUS | Status: DC
  Administered 2011-11-10 – 2011-11-11 (×3): 75 [IU] via SUBCUTANEOUS

## 2011-11-10 MED ORDER — SODIUM CHLORIDE 0.9 % IJ SOLN
3.0000 mL | Freq: Two times a day (BID) | INTRAMUSCULAR | Status: DC
  Administered 2011-11-10 – 2011-11-11 (×4): 3 mL via INTRAVENOUS
  Filled 2011-11-10 (×2): qty 3

## 2011-11-10 MED ORDER — FUROSEMIDE 40 MG PO TABS
40.0000 mg | ORAL_TABLET | Freq: Every day | ORAL | Status: DC
  Administered 2011-11-10: 40 mg via ORAL
  Filled 2011-11-10: qty 1

## 2011-11-10 MED ORDER — SODIUM CHLORIDE 0.9 % IJ SOLN
3.0000 mL | INTRAMUSCULAR | Status: DC | PRN
  Filled 2011-11-10 (×2): qty 3

## 2011-11-10 MED ORDER — CITALOPRAM HYDROBROMIDE 20 MG PO TABS: 20.0000 mg | ORAL_TABLET | Freq: Every day | ORAL | Status: DC

## 2011-11-10 MED ORDER — INSULIN ASPART 100 UNIT/ML ~~LOC~~ SOLN
50.0000 [IU] | Freq: Three times a day (TID) | SUBCUTANEOUS | Status: DC
  Administered 2011-11-10 – 2011-11-12 (×8): 50 [IU] via SUBCUTANEOUS

## 2011-11-10 MED ORDER — ALBUTEROL SULFATE (5 MG/ML) 0.5% IN NEBU: 2.5000 mg | INHALATION_SOLUTION | RESPIRATORY_TRACT | Status: DC | PRN

## 2011-11-10 MED ORDER — ASPIRIN EC 81 MG PO TBEC: 81.0000 mg | DELAYED_RELEASE_TABLET | Freq: Every day | ORAL | Status: DC

## 2011-11-10 MED ORDER — ALBUTEROL SULFATE HFA 108 (90 BASE) MCG/ACT IN AERS: 2.0000 | INHALATION_SPRAY | RESPIRATORY_TRACT | Status: DC | PRN

## 2011-11-10 MED ORDER — LISINOPRIL 10 MG PO TABS
20.0000 mg | ORAL_TABLET | Freq: Every day | ORAL | Status: DC
  Administered 2011-11-10 – 2011-11-12 (×3): 20 mg via ORAL
  Filled 2011-11-10 (×3): qty 2

## 2011-11-10 MED ORDER — SIMVASTATIN 10 MG PO TABS
5.0000 mg | ORAL_TABLET | Freq: Every day | ORAL | Status: DC
  Administered 2011-11-10 – 2011-11-12 (×4): 5 mg via ORAL
  Filled 2011-11-10 (×4): qty 1

## 2011-11-10 MED ORDER — CITALOPRAM HYDROBROMIDE 20 MG PO TABS
20.0000 mg | ORAL_TABLET | Freq: Every day | ORAL | Status: DC
  Administered 2011-11-10: 20 mg via ORAL
  Filled 2011-11-10: qty 1

## 2011-11-10 MED ORDER — CLOPIDOGREL BISULFATE 75 MG PO TABS
75.0000 mg | ORAL_TABLET | Freq: Every day | ORAL | Status: DC
  Administered 2011-11-10 – 2011-11-12 (×3): 75 mg via ORAL
  Filled 2011-11-10 (×3): qty 1

## 2011-11-10 MED ORDER — INSULIN GLARGINE 100 UNIT/ML ~~LOC~~ SOLN: 70.0000 [IU] | Freq: Two times a day (BID) | SUBCUTANEOUS | Status: DC

## 2011-11-10 MED ORDER — METOPROLOL TARTRATE 50 MG PO TABS
50.0000 mg | ORAL_TABLET | Freq: Two times a day (BID) | ORAL | Status: DC
  Administered 2011-11-10 – 2011-11-12 (×6): 50 mg via ORAL
  Filled 2011-11-10 (×6): qty 1

## 2011-11-10 NOTE — Clinical Social Work Psychosocial (Signed)
Clinical Social Work Department BRIEF PSYCHOSOCIAL ASSESSMENT 11/10/2011  Patient:  Nicole [name],Nicole [name]     Account Number:  [id number]     Admit date:  11/09/2011  Clinical Social Worker:  Nicole [name]  Date/Time:  11/10/2011 10:55 AM  Referred by:  RN  Date Referred:  11/10/2011 Referred for  Psychosocial assessment   Other Referral:   Interview type:  Patient Other interview type:   and husband    PSYCHOSOCIAL DATA Living Status:  FAMILY Admitted from facility:   Level of care:   Primary support name:  Nicole [name] Primary support relationship to patient:  SPOUSE Degree of support available:   adequate    CURRENT CONCERNS Current Concerns  Other - See comment   Other Concerns:   family concerns    SOCIAL WORK ASSESSMENT / PLAN CSW met with pt and pt's husband at bedside following report from RN in progression about concerns with family. Pt states she lives with her husband. She works about 32 hours a week for [employer]. She was admitted yesterday for SOB and chest pain. Pt admits that she was arguing with her 47 year old daughter and 47 year old niece. Her niece had been staying with pt for a few weeks and this situation was not working out well. Pt reports that she has moved back to [place] and is resolved. She said she will have a stress free environment when she returns home. Pt denies any other issues currently. Her daughter lives nearby but they generally have a good relationship by pt report. Pt does have question about her HCPOA. She states that she has gotten married since this was completed and has a name change. CSW will follow up with recommendation from supervisor with how to address this. Pt also has concerns about medical bills. Referral made to financial counselor.   Assessment/plan status:  Psychosocial Support/Ongoing Assessment of Needs Other assessment/ plan:   Information/referral to community resources:   Artist    PATIENT'S/FAMILY'S RESPONSE TO  PLAN OF CARE: Pt appreciative of visit and CSW will follow up about Advanced Directive.        Nicole [name], [place] [phone]

## 2011-11-10 NOTE — Progress Notes (Signed)
UR Chart Review Completed  

## 2011-11-10 NOTE — Clinical Social Work Note (Signed)
Per supervisor, advanced directive would still be valid with name change, but pt would like to have it corrected. New HCPOA paperwork completed. Pt did not want to complete living will. Original and copy given to pt and copy placed on chart.  Derenda Fennel, [place] [phone]

## 2011-11-10 NOTE — Care Management Note (Unsigned)
    Page 1 of 1   11/10/2011     1:53:21 PM   CARE MANAGEMENT NOTE 11/10/2011  Patient:  Nicole [name]   Account Number:  [id number]  Date Initiated:  11/10/2011  Documentation initiated by:  Rosemary Holms  Subjective/Objective Assessment:   Pt admitted from home w/ spouse with SOB. Concerned about her POA papers needing name changed. CSW notified.     Action/Plan:   Pt to return home at DC. No needs anticipated   Anticipated DC Date:  11/12/2011   Anticipated DC Plan:  HOME/SELF CARE      DC Planning Services  CM consult      Choice offered to / List presented to:             Status of service:  In process, will continue to follow Medicare Important Message given?   (If response is "NO", the following Medicare IM given date fields will be blank) Date Medicare IM given:   Date Additional Medicare IM given:    Discharge Disposition:    Per UR Regulation:    If discussed at Long Length of Stay Meetings, dates discussed:    Comments:  11/10/11 1000 Nicole [name] Leanord Hawking RN BSN CM

## 2011-11-10 NOTE — Progress Notes (Signed)
[facility] PROGRESS NOTE  Nicole [name] ZOX:[id number] DOB: Oct 05, 1965 DOA: 11/09/2011 PCP: [name], PA-C  Assessment/Plan: Principal Problem:  *Dyspnea Active Problems:  Morbid obesity  HTN (hypertension), benign  CHF (congestive heart failure)  COPD (chronic obstructive pulmonary disease)  1. Acute on chronic diastolic congestive heart failure. Patient has been having shortness of breath for the past 3 weeks now. She does have an echo from February of this year which was done for shortness of breath at that time as well. Her chest x-ray shows mild CHF. She does have signs of generalized edema. We will start the patient on intravenous Lasix and do daily weights as well as strict intake and output. Thyroid studies are currently pending, cardiac enzymes are negative thus far. If necessary we will asked for cardiology consultation, if her symptoms do not improve. She recently had a stress test in May which was normal. 2. Morbid obesity 3. Possible obstructive sleep apnea. Patient will need an outpatient sleep study 4. COPD, stable 5. Hypertension, stable 6. Diabetes. Continue her outpatient regimen. Follow blood sugars  Code Status: Full code Family Communication: Discussed with patient and husband at bedside Disposition Plan: Discharge home once improved   Brief narrative: This lady was admitted to the hospital with shortness of breath and chest pain. She described her symptoms as gradually worsening over the past few weeks. She's also noticed some generalized edema which is also been worsening. She does not have mild CHF in the emergency room and was admitted to the hospital for further evaluation and treatment.  Consultants:  None  Procedures:  None  Antibiotics:  None  HPI/Subjective: Still feels short of breath, chest pain is on and off. No new complaints  Objective: Filed Vitals:   11/10/11 0041 11/10/11 0551 11/10/11 0831 11/10/11 1100  BP:   126/63  143/78  Pulse:  75 72 70  Temp:  98.6 F (37 C)    TempSrc:  Oral    Resp:  18 18   Height:      Weight: 151.3 kg (333 lb 8.9 oz)     SpO2:  90% 92% 95%    Intake/Output Summary (Last 24 hours) at 11/10/11 1144 Last data filed at 11/10/11 0824  Gross per 24 hour  Intake    223 ml  Output      0 ml  Net    223 ml   Filed Weights   11/10/11 0041  Weight: 151.3 kg (333 lb 8.9 oz)    Exam:   General:  No signs of distress  Cardiovascular: S1, S2, regular rate and rhythm, 1+ edema bilaterally  Respiratory: Clear to auscultation bilaterally  Abdomen: Soft obese, nontender, bowel sounds are active  Data Reviewed: Basic Metabolic Panel:  Lab 11/10/11 8119 11/09/11 2140  NA 140 137  K 4.5 3.9  CL 100 99  CO2 32 29  GLUCOSE 165* 128*  BUN 15 16  CREATININE 0.75 0.69  CALCIUM 9.1 9.3  MG -- --  PHOS -- --   Liver Function Tests: No results found for this basename: AST:5,ALT:5,ALKPHOS:5,BILITOT:5,PROT:5,ALBUMIN:5 in the last 168 hours No results found for this basename: LIPASE:5,AMYLASE:5 in the last 168 hours No results found for this basename: AMMONIA:5 in the last 168 hours CBC:  Lab 11/10/11 0548 11/09/11 2140  WBC 13.0* 13.9*  NEUTROABS -- 9.4*  HGB 11.7* 11.9*  HCT 36.0 36.3  MCV 92.1 91.2  PLT 282 295   Cardiac Enzymes:  Lab 11/10/11 0548 11/10/11 0103  11/09/11 2140  CKTOTAL -- -- --  CKMB -- -- --  CKMBINDEX -- -- --  TROPONINI <0.30 <0.30 <0.30   BNP (last 3 results)  Basename 11/09/11 2140 02/19/11 0529 02/18/11 2341  PROBNP 188.7* 130.6* 173.7*   CBG:  Lab 11/10/11 0713 11/10/11 0041  GLUCAP 146* 101*    No results found for this or any previous visit (from the past 240 hour(s)).   Studies: Dg Chest 2 View  11/10/2011  *RADIOLOGY REPORT*  Clinical Data: Short of breath  CHEST - 2 VIEW  Comparison: Yesterday  Findings: Moderate cardiomegaly.  Vascular congestion.  Stable mild basilar edema.  No pneumothorax. No pleural  effusion.  Stable thoracic spine.  IMPRESSION: Stable mild CHF.   Original Report Authenticated By: Donavan Burnet, M.D.    Dg Chest Portable 1 View  11/09/2011  *RADIOLOGY REPORT*  Clinical Data: Chest pain shortness of breath.  Former smoker. COPD.  Diabetes.  PORTABLE CHEST - 1 VIEW  Comparison: 02/18/2011.  Findings: Cardiomegaly.  Interstitial and alveolar prominence consistent with pulmonary edema.  No pneumothorax.  Degenerative change thoracic spine.  Worsening appearance from priors.  IMPRESSION: Cardiomegaly.  Pulmonary edema.  Worsening aeration.   Original Report Authenticated By: Elsie Stain, M.D.     Scheduled Meds:   . albuterol  5 mg Nebulization Once  . citalopram  20 mg Oral Daily  . clopidogrel  75 mg Oral Daily  . furosemide  40 mg Intravenous Once  . furosemide  40 mg Intravenous BID  . insulin aspart  50 Units Subcutaneous TID AC  . insulin glargine  70 Units Subcutaneous Daily  . insulin glargine  75 Units Subcutaneous QHS  . lisinopril  20 mg Oral Daily  . metoprolol  50 mg Oral BID  . simvastatin  5 mg Oral q1800  . sodium chloride  3 mL Intravenous Q12H  . sodium chloride  3 mL Intravenous Q12H  . sodium chloride      . DISCONTD: aspirin EC  81 mg Oral Daily  . DISCONTD: citalopram  20 mg Oral Daily  . DISCONTD: citalopram  20 mg Oral Daily  . DISCONTD: furosemide  40 mg Oral Daily  . DISCONTD: insulin glargine  70-75 Units Subcutaneous BID   Continuous Infusions:   . DISCONTD: nitroGLYCERIN Stopped (11/09/11 2320)    Principal Problem:  *Dyspnea Active Problems:  Morbid obesity  HTN (hypertension), benign  CHF (congestive heart failure)  COPD (chronic obstructive pulmonary disease)    Time spent: 30 minutes    Nicole [name]  [facility] Pager [phone]. If 7PM-7AM, please contact night-coverage at [email or web address], password [facility] 11/10/2011, 11:44 AM  LOS: 1 day

## 2011-11-11 LAB — CBC
Platelets: 285 10*3/uL (ref 150–400)
RDW: 15.4 % (ref 11.5–15.5)
WBC: 14.4 10*3/uL — ABNORMAL HIGH (ref 4.0–10.5)

## 2011-11-11 LAB — BASIC METABOLIC PANEL
GFR calc Af Amer: >90 mL/min (ref >90)
GFR calc non Af Amer: >90 mL/min (ref >90)
Potassium: 4 mEq/L (ref 3.5–5.1)
Sodium: 141 mEq/L (ref 135–145)

## 2011-11-11 LAB — GLUCOSE, CAPILLARY: Glucose-Capillary: 205 mg/dL — ABNORMAL HIGH (ref 70–99)

## 2011-11-11 MED ORDER — FUROSEMIDE 40 MG PO TABS
40.0000 mg | ORAL_TABLET | Freq: Two times a day (BID) | ORAL | Status: DC
  Administered 2011-11-11 – 2011-11-12 (×3): 40 mg via ORAL
  Filled 2011-11-11 (×3): qty 1

## 2011-11-11 MED ORDER — ALBUTEROL SULFATE (5 MG/ML) 0.5% IN NEBU
2.5000 mg | INHALATION_SOLUTION | Freq: Four times a day (QID) | RESPIRATORY_TRACT | Status: DC
  Administered 2011-11-11 – 2011-11-12 (×4): 2.5 mg via RESPIRATORY_TRACT
  Filled 2011-11-11 (×4): qty 0.5

## 2011-11-11 MED ORDER — TIOTROPIUM BROMIDE MONOHYDRATE 18 MCG IN CAPS
18.0000 ug | ORAL_CAPSULE | Freq: Every day | RESPIRATORY_TRACT | Status: DC
  Administered 2011-11-12: 18 ug via RESPIRATORY_TRACT
  Filled 2011-11-11: qty 5

## 2011-11-11 NOTE — Progress Notes (Signed)
UR Chart Review Completed  

## 2011-11-11 NOTE — Progress Notes (Signed)
[facility] PROGRESS NOTE  Nicole [name] AOZ:[id number] DOB: 1965-09-02 DOA: 11/09/2011 PCP: [name], PA-C  Assessment/Plan: Principal Problem:  *Dyspnea Active Problems:  Morbid obesity  Diabetes mellitus type 2 in obese  HTN (hypertension), benign  CHF (congestive heart failure)  COPD (chronic obstructive pulmonary disease)  1. Acute on chronic diastolic congestive heart failure. Mild.  Patient was found to have mild pulmonary edema.  She recently had an echo and stress test in May 2013, which was found to have a normal ejection fraction and otherwise normal stress test.  She is inconsistent with her history and shortness of breath appears to be more chronic (over months to years). She appears to be oxygenating normally. I am doubtful that her shortness of breath is purely cardiac or pulmonary in origin.  I suspect that her shortness of breath on exertion may be related to her obesity and overall deconditioning. She has already had a recent cardiac evaluation with [facility].  She is requesting a pulmonology evaluation.  We will ask Dr. Juanetta Gosling for any input regarding her shortness of breath.  Will change lasix to po since she does not have overt signs of volume overload. 2. Morbid obesity, will ask dietician to see for education. 3. Possible obstructive sleep apnea. Patient will need an outpatient sleep study 4. COPD, stable.  Continue patient on albuterol 5. Hypertension, stable 6. Diabetes. Continue her outpatient regimen. Follow blood sugars  Code Status: Full code Family Communication: Discussed with patient and husband at bedside Disposition Plan: Discharge home once improved   Brief narrative: This lady was admitted to the hospital with shortness of breath and chest pain. She described her symptoms as gradually worsening over the past few weeks. She's also noticed some generalized edema which is also been worsening. She does not have mild CHF in the  emergency room and was admitted to the hospital for further evaluation and treatment.  Consultants:  None  Procedures:  None  Antibiotics:  None  HPI/Subjective: Still feels short of breath, chest pain is on and off. No new complaints  Objective: Filed Vitals:   11/10/11 2046 11/11/11 0518 11/11/11 0934 11/11/11 1400  BP: 145/70 139/85 119/56 117/64  Pulse: 71 71 69 74  Temp: 98.2 F (36.8 C) 98.1 F (36.7 C)  98.1 F (36.7 C)  TempSrc:  Oral    Resp: 20 20  20   Height:      Weight:  153.497 kg (338 lb 6.4 oz)    SpO2: 92% 98%  100%    Intake/Output Summary (Last 24 hours) at 11/11/11 1642 Last data filed at 11/11/11 1633  Gross per 24 hour  Intake    240 ml  Output   3300 ml  Net  -3060 ml   Filed Weights   11/10/11 0041 11/11/11 0518  Weight: 151.3 kg (333 lb 8.9 oz) 153.497 kg (338 lb 6.4 oz)    Exam:   General:  No signs of distress  Cardiovascular: S1, S2, regular rate and rhythm, 1+ edema bilaterally  Respiratory: Clear to auscultation bilaterally  Abdomen: Soft obese, nontender, bowel sounds are active  Data Reviewed: Basic Metabolic Panel:  Lab 11/11/11 9629 11/10/11 0548 11/09/11 2140  NA 141 140 137  K 4.0 4.5 3.9  CL 100 100 99  CO2 34* 32 29  GLUCOSE 87 165* 128*  BUN 18 15 16   CREATININE 0.67 0.75 0.69  CALCIUM 9.4 9.1 9.3  MG -- -- --  PHOS -- -- --  Liver Function Tests: No results found for this basename: AST:5,ALT:5,ALKPHOS:5,BILITOT:5,PROT:5,ALBUMIN:5 in the last 168 hours No results found for this basename: LIPASE:5,AMYLASE:5 in the last 168 hours No results found for this basename: AMMONIA:5 in the last 168 hours CBC:  Lab 11/11/11 0531 11/10/11 0548 11/09/11 2140  WBC 14.4* 13.0* 13.9*  NEUTROABS -- -- 9.4*  HGB 12.1 11.7* 11.9*  HCT 37.6 36.0 36.3  MCV 92.4 92.1 91.2  PLT 285 282 295   Cardiac Enzymes:  Lab 11/10/11 1243 11/10/11 0548 11/10/11 0103 11/09/11 2140  CKTOTAL -- -- -- --  CKMB -- -- -- --    CKMBINDEX -- -- -- --  TROPONINI <0.30 <0.30 <0.30 <0.30   BNP (last 3 results)  Basename 11/09/11 2140 02/19/11 0529 02/18/11 2341  PROBNP 188.7* 130.6* 173.7*   CBG:  Lab 11/11/11 1132 11/11/11 0954 11/11/11 0742 11/10/11 0713 11/10/11 0041  GLUCAP 191* 205* 85 146* 101*    No results found for this or any previous visit (from the past 240 hour(s)).   Studies: Dg Chest 2 View  11/10/2011  *RADIOLOGY REPORT*  Clinical Data: Short of breath  CHEST - 2 VIEW  Comparison: Yesterday  Findings: Moderate cardiomegaly.  Vascular congestion.  Stable mild basilar edema.  No pneumothorax. No pleural effusion.  Stable thoracic spine.  IMPRESSION: Stable mild CHF.   Original Report Authenticated By: Donavan Burnet, M.D.    Dg Chest Portable 1 View  11/09/2011  *RADIOLOGY REPORT*  Clinical Data: Chest pain shortness of breath.  Former smoker. COPD.  Diabetes.  PORTABLE CHEST - 1 VIEW  Comparison: 02/18/2011.  Findings: Cardiomegaly.  Interstitial and alveolar prominence consistent with pulmonary edema.  No pneumothorax.  Degenerative change thoracic spine.  Worsening appearance from priors.  IMPRESSION: Cardiomegaly.  Pulmonary edema.  Worsening aeration.   Original Report Authenticated By: Elsie Stain, M.D.     Scheduled Meds:    . citalopram  20 mg Oral Daily  . clopidogrel  75 mg Oral Daily  . furosemide  40 mg Intravenous BID  . insulin aspart  50 Units Subcutaneous TID AC  . insulin glargine  70 Units Subcutaneous Daily  . insulin glargine  75 Units Subcutaneous QHS  . lisinopril  20 mg Oral Daily  . metoprolol  50 mg Oral BID  . simvastatin  5 mg Oral q1800  . sodium chloride  3 mL Intravenous Q12H  . sodium chloride  3 mL Intravenous Q12H   Continuous Infusions:   Principal Problem:  *Dyspnea Active Problems:  Morbid obesity  Diabetes mellitus type 2 in obese  HTN (hypertension), benign  CHF (congestive heart failure)  COPD (chronic obstructive pulmonary  disease)    Time spent: 30 minutes    Krystopher Kuenzel  [facility] Pager [phone]. If 7PM-7AM, please contact night-coverage at [email or web address], password [facility] 11/11/2011, 4:42 PM  LOS: 2 days

## 2011-11-12 ENCOUNTER — Other Ambulatory Visit: Payer: Self-pay

## 2011-11-12 DIAGNOSIS — J449 Chronic obstructive pulmonary disease, unspecified: Secondary | ICD-10-CM

## 2011-11-12 DIAGNOSIS — I5031 Acute diastolic (congestive) heart failure: Secondary | ICD-10-CM | POA: Diagnosis present

## 2011-11-12 LAB — GLUCOSE, CAPILLARY
Glucose-Capillary: 130 mg/dL — ABNORMAL HIGH (ref 70–99)
Glucose-Capillary: 82 mg/dL (ref 70–99)

## 2011-11-12 MED ORDER — TIOTROPIUM BROMIDE MONOHYDRATE 18 MCG IN CAPS: 18.0000 ug | ORAL_CAPSULE | Freq: Every day | RESPIRATORY_TRACT | Status: DC

## 2011-11-12 MED ORDER — FUROSEMIDE 40 MG PO TABS: 40.0000 mg | ORAL_TABLET | Freq: Two times a day (BID) | ORAL | Status: DC

## 2011-11-12 MED ORDER — SODIUM CHLORIDE 0.9 % IN NEBU
INHALATION_SOLUTION | RESPIRATORY_TRACT | Status: AC
  Administered 2011-11-12: 3 mL
  Filled 2011-11-12: qty 3

## 2011-11-12 MED ORDER — LIVING WELL WITH DIABETES BOOK
Freq: Once | Status: AC
  Administered 2011-11-12: 09:00:00
  Filled 2011-11-12: qty 1

## 2011-11-12 NOTE — Consult Note (Signed)
NAME:  Nicole [name], Nicole [name]               ACCOUNT NO.:  [id number]  MEDICAL RECORD NO.:  [id number]  LOCATION:  A202                          FACILITY:  [facility]  PHYSICIAN:  Guadalupe Kerekes L. Juanetta Gosling, M.D.DATE OF BIRTH:  1966/01/24  DATE OF CONSULTATION: DATE OF DISCHARGE:                                CONSULTATION   The patient of the hospitalist.  REASON FOR CONSULTATION:  COPD, shortness of breath.  HISTORY:  This is a 46 year old, who has a history of morbid obesity, diabetes, COPD and asthma, edema possible diastolic congestive heart failure and possible sleep apnea.  She has been evaluated on multiple occasions and it has now been quite clear what is wrong with her.  She says that she had asthma in childhood.  She is very short of breath and not able to feel very much without becoming short of breath.  She has not had a sleep study, but she does have symptoms of sleep apnea.  She says she has had significant edema.  She does cough.  She has a previous smoking history, but stopped about 10 years ago.  She has not had any exposures to other toxins.  PAST MEDICAL HISTORY:  Positive for: 1. Hypertension. 2. Diabetes. 3. Morbid obesity. 4. Hyperlipidemia. 5. Possible history of coronary disease. 6. COPD. 7. History of pancreatitis.  PAST SURGICAL HISTORY:  Surgically, she has had an abdominal hysterectomy, tonsillectomy, knee surgery, some sort of surgery on her nose, and an angioplasty.  MEDICATIONS AT HOME: 1. Albuterol 2 puffs q.i.d. 2. Celexa 20 mg daily. 3. Plavix 75 mg daily. 4. Fish oil as needed. 5. Lasix 40 mg daily. 6. Insulin 15 units 3 times a day. 7. Lantus insulin 70-75 units daily. 8. Lisinopril 20 mg daily. 9. Metoprolol 50 mg b.i.d. 10.Pravachol 40 mg daily.  ALLERGIES:  She has allergies to ERYTHROMYCIN, but no other medications except aspirin.  SOCIAL HISTORY:  As mentioned, she has about a 40-pack-year smoking history, but stopped about 10 years ago.  She  does not use any alcohol or any other drugs.  FAMILY HISTORY:  Positive for COPD, diabetes, heart failure, hypertension, and renal failure.  REVIEW OF SYSTEMS:  Except as mentioned is negative.  PHYSICAL EXAMINATION:  GENERAL:  An obese female, who is in no acute distress. HEENT:  Her pupils are reactive.  Nose and throat are clear.  Mucous membranes are moist. CHEST:  Shows some rhonchi bilaterally. HEART:  Regular. ABDOMEN:  Soft. EXTREMITIES:  No edema. CENTRAL NERVOUS SYSTEM:  Grossly intact.  Addendum is that she has mild apparently chronic pretibial edema.  ASSESSMENT:  I think she has multifactorial shortness of breath.  I think she may have some component of COPD.  I think she has diastolic heart failure.  I think her morbid obesity contributes.  I think she probably has sleep apnea.  PLAN:  My plan is for her to have a spirometry, and although I do not typically do that in the hospital.  I think it would be helpful to know if she has any airflow obstruction.  I added Spiriva.  She will continue with her other treatments.  Thanks for allowing me to see her with  you.     Oneal Deputy. Juanetta Gosling, M.D.     ELH/MEDQ  D:  11/11/2011  T:  11/12/2011  Job:  [id number]

## 2011-11-12 NOTE — Progress Notes (Signed)
Inpatient Diabetes Program Recommendations  AACE/ADA: New Consensus Statement on Inpatient Glycemic Control (2013)  Target Ranges:  Prepandial:   less than 140 mg/dL      Peak postprandial:   less than 180 mg/dL (1-2 hours)      Critically ill patients:  140 - 180 mg/dL   Reason for Visit: Referral for uncontrolled diabetes (?) No A1C to determine control.  Inpatient Diabetes Program Recommendations HgbA1C: Please order. Last documented value in Dec of 2012 Ordered patient education materials, RD consult, DM ed'l videos. Does pt need OP education?  Note: Thank you, Lenor Coffin, RN, CNS, Diabetes Coordinator [phone])

## 2011-11-12 NOTE — Progress Notes (Signed)
Pt d/c instructions, medications and f/u appts discussed with pt. Pt verbalized understanding. No issues or concerns addressed. Pt d/ced home at this time. Taken out of facility by staff via w/c.

## 2011-11-12 NOTE — Progress Notes (Signed)
Subjective: She did not get Spiriva yet. We are not going 2 be able to get spirometry done today. She says she is still short of breath.  Objective: Vital signs in last 24 hours: Temp:  [98.2 F (36.8 C)-98.3 F (36.8 C)] 98.2 F (36.8 C) (09/20 1400) Pulse Rate:  [69-74] 69  (09/20 1400) Resp:  [18] 18  (09/20 1400) BP: (123-154)/(59-77) 123/77 mmHg (09/20 1400) SpO2:  [94 %-100 %] 97 % (09/20 1400) Weight:  [153.089 kg (337 lb 8 oz)] 153.089 kg (337 lb 8 oz) (09/20 0530) Weight change: -0.408 kg (-14.4 oz) Last BM Date: 11/11/11  Intake/Output from previous day: 09/19 0701 - 09/20 0700 In: -  Out: 2600 [Urine:2600]  PHYSICAL EXAM General appearance: alert, cooperative, no distress and morbidly obese Resp: clear to auscultation bilaterally Cardio: regular rate and rhythm, S1, S2 normal, no murmur, click, rub or gallop GI: soft, non-tender; bowel sounds normal; no masses,  no organomegaly Extremities: extremities normal, atraumatic, no cyanosis or edema  Lab Results:    Basic Metabolic Panel:  Basename 11/11/11 0531 11/10/11 0548  NA 141 140  K 4.0 4.5  CL 100 100  CO2 34* 32  GLUCOSE 87 165*  BUN 18 15  CREATININE 0.67 0.75  CALCIUM 9.4 9.1  MG -- --  PHOS -- --   Liver Function Tests: No results found for this basename: AST:2,ALT:2,ALKPHOS:2,BILITOT:2,PROT:2,ALBUMIN:2 in the last 72 hours No results found for this basename: LIPASE:2,AMYLASE:2 in the last 72 hours No results found for this basename: AMMONIA:2 in the last 72 hours CBC:  Basename 11/11/11 0531 11/10/11 0548 11/09/11 2140  WBC 14.4* 13.0* --  NEUTROABS -- -- 9.4*  HGB 12.1 11.7* --  HCT 37.6 36.0 --  MCV 92.4 92.1 --  PLT 285 282 --   Cardiac Enzymes:  Basename 11/10/11 1243 11/10/11 0548 11/10/11 0103  CKTOTAL -- -- --  CKMB -- -- --  CKMBINDEX -- -- --  TROPONINI <0.30 <0.30 <0.30   BNP:  Basename 11/09/11 2140  PROBNP 188.7*   D-Dimer: No results found for this basename:  DDIMER:2 in the last 72 hours CBG:  Basename 11/12/11 1635 11/12/11 1405 11/12/11 1131 11/12/11 0727 11/11/11 1636 11/11/11 1132  GLUCAP 93 82 130* 86 95 191*   Hemoglobin A1C: No results found for this basename: HGBA1C in the last 72 hours Fasting Lipid Panel: No results found for this basename: CHOL,HDL,LDLCALC,TRIG,CHOLHDL,LDLDIRECT in the last 72 hours Thyroid Function Tests:  Basename 11/10/11 0103  TSH 4.814*  T4TOTAL --  FREET4 --  T3FREE --  THYROIDAB --   Anemia Panel: No results found for this basename: VITAMINB12,FOLATE,FERRITIN,TIBC,IRON,RETICCTPCT in the last 72 hours Coagulation: No results found for this basename: LABPROT:2,INR:2 in the last 72 hours Urine Drug Screen: Drugs of Abuse  No results found for this basename: labopia, cocainscrnur, labbenz, amphetmu, thcu, labbarb    Alcohol Level: No results found for this basename: ETH:2 in the last 72 hours Urinalysis: No results found for this basename: COLORURINE:2,APPERANCEUR:2,LABSPEC:2,PHURINE:2,GLUCOSEU:2,HGBUR:2,BILIRUBINUR:2,KETONESUR:2,PROTEINUR:2,UROBILINOGEN:2,NITRITE:2,LEUKOCYTESUR:2 in the last 72 hours Misc. Labs:  ABGS No results found for this basename: PHART,PCO2,PO2ART,TCO2,HCO3 in the last 72 hours CULTURES No results found for this or any previous visit (from the past 240 hour(s)). Studies/Results: No results found.  Medications:  Scheduled:   . albuterol  2.5 mg Nebulization Q6H  . citalopram  20 mg Oral Daily  . clopidogrel  75 mg Oral Daily  . furosemide  40 mg Oral BID  . insulin aspart  50 Units Subcutaneous TID  AC  . insulin glargine  70 Units Subcutaneous Daily  . insulin glargine  75 Units Subcutaneous QHS  . lisinopril  20 mg Oral Daily  . living well with diabetes book   Does not apply Once  . metoprolol  50 mg Oral BID  . simvastatin  5 mg Oral q1800  . sodium chloride  3 mL Intravenous Q12H  . sodium chloride  3 mL Intravenous Q12H  . sodium chloride      .  tiotropium  18 mcg Inhalation Daily  . DISCONTD: furosemide  40 mg Intravenous BID   Continuous:  OZH:YQMVHQ chloride, sodium chloride, DISCONTD: albuterol  Assesment:she has shortness of breath that I think is multi-factorial. Principal Problem:  *Dyspnea Active Problems:  Morbid obesity  Diabetes mellitus type 2 in obese  HTN (hypertension), benign  CHF (congestive heart failure)  COPD (chronic obstructive pulmonary disease)    Plan:I would like to see have she responds to Spiriva    LOS: 3 days   Brooks Kinnan L 11/12/2011, 4:49 PM

## 2011-11-12 NOTE — Discharge Summary (Signed)
Physician Discharge Summary  Nicole [name] UJW:[id number] DOB: 11/11/1965 DOA: 11/09/2011  PCP: [name], PA-C  Admit date: 11/09/2011 Discharge date: 11/12/2011  Recommendations for Outpatient Follow-up:  1. Patient will follow up with Dr. Juanetta Gosling on Tuesday for pulmonary function tests. 2. Followup with primary care physician in 2 weeks.  Discharge Diagnoses:  Principal Problem:  *Dyspnea Active Problems:  Morbid obesity  Diabetes mellitus type 2 in obese  HTN (hypertension), benign  COPD (chronic obstructive pulmonary disease)  Acute diastolic congestive heart failure   Discharge Condition: Stable  Diet recommendation: Low calorie, low salt  Filed Weights   11/10/11 0041 11/11/11 0518 11/12/11 0530  Weight: 151.3 kg (333 lb 8.9 oz) 153.497 kg (338 lb 6.4 oz) 153.089 kg (337 lb 8 oz)    History of present illness:  46 yo female h/o morbid obesity, iddm, copd/asthma, chronic le edema comes in with 3 weeks of worsening sob. Denies prior h/o chf and was told she "might have it" in the past. Has been evaluated by cards with recent lexiscan in 05/13 which was normal. Has been on lasix but feels it does not help. Does have chrnic le edema no significant change. No fever or cough. Some pnd. Snores a lot at night per family members and has never been evaluated for sleep apnea. Denies any wheezing. No tobacco abuse. No cp. Asked to admit for possible chf exac.   Hospital Course:  This lady was admitted to the hospital for shortness of breath. Her dyspnea is likely multifactorial. Contributing factors include diastolic heart failure, likely underlying COPD, morbid obesity and deconditioning. Initial chest x-ray on admission showed mild CHF and BN peptide was mildly elevated. Patient was afebrile and  there was no clinical evidence of a pneumonia. She was started on IV Lasix and did not really have any significant improvement in her breathing. She was also started on scheduled  nebulization treatments, but really did not have any wheezing. She reports her shortness of breath is mostly on exertion. When she is sitting down, she appears to be comfortable. Throughout her hospitalization she has maintained her saturations from 95-100% on room air. Due to the persistence of her symptoms, she was seen by Dr. Juanetta Gosling from pulmonology. Spiriva was added to her medication regimen. We attempted to get pulmonary function tests in the hospital. Unfortunately this was not possible, so she has been set up for outpatient pulmonary function tests. She does not appear to be overtly volume overloaded which would be causing her shortness of breath. She is also not significantly wheezing and does not sound tight in her chest. Again, I suspect that her shortness of breath is multifactorial. She will follow up with Dr. Juanetta Gosling for pulmonary function tests. At this time she is requesting a discharge home. I do not anticipate any further investigations will be required in the hospital and since she is oxygenating appropriately, we will discharge her home today for outpatient followup. Of note, patient was found to have a leukocytosis. Review of records indicate that this is more of a chronic issue. I do not think that she has an underlying infection. The patient has been advised that she can return to the hospital if she has any worsening shortness of breath, fever, chest pain or any other complaints  Procedures:  None  Consultations:  Pulmonology, Dr. Juanetta Gosling  Discharge Exam: Filed Vitals:   11/12/11 0121 11/12/11 0530 11/12/11 0800 11/12/11 1400  BP:  154/69  123/77  Pulse:  74  69  Temp:  98.3 F (36.8 C)  98.2 F (36.8 C)  TempSrc:  Oral  Oral  Resp:    18  Height:      Weight:  153.089 kg (337 lb 8 oz)    SpO2: 95% 94% 94% 97%    General: Sitting comfortably in a chair, normal work of breathing, no signs of distress, able to carry on a conversation without difficulty Cardiovascular:  S1, S2, regular rate and rhythm Respiratory: Clear to auscultation bilaterally  Discharge Instructions      Discharge Orders    Future Appointments: Provider: Department: Dept Phone: Center:   11/16/2011 11:00 AM [facility] [phone] None     Future Orders Please Complete By Expires   Diet - low sodium heart healthy      Diet Carb Modified      Increase activity slowly      Call MD for:      Comments:   Worsening shortness of breath, cough, fever, chest pain, dizziness   Discharge instructions      Comments:   Low glycemic index diet       Medication List     As of 11/12/2011  6:15 PM    TAKE these medications         citalopram 20 MG tablet   Commonly known as: CELEXA   Take 20 mg by mouth daily.      clopidogrel 75 MG tablet   Commonly known as: PLAVIX   Take 75 mg by mouth daily.      fish oil-omega-3 fatty acids 1000 MG capsule   Take 1 g by mouth 2 (two) times daily.      furosemide 40 MG tablet   Commonly known as: LASIX   Take 1 tablet (40 mg total) by mouth 2 (two) times daily.      insulin aspart 100 UNIT/ML injection   Commonly known as: novoLOG   Inject 50 Units into the skin 3 (three) times daily before meals.      insulin glargine 100 UNIT/ML injection   Commonly known as: LANTUS   Inject 70-75 Units into the skin 2 (two) times daily. Take70 units in the morning and 75 units at bedtime      lisinopril 20 MG tablet   Commonly known as: PRINIVIL,ZESTRIL   Take 20 mg by mouth daily.      metoprolol 50 MG tablet   Commonly known as: LOPRESSOR   Take 50 mg by mouth 2 (two) times daily.      pravastatin 40 MG tablet   Commonly known as: PRAVACHOL   Take 40 mg by mouth at bedtime.      tiotropium 18 MCG inhalation capsule   Commonly known as: SPIRIVA   Place 1 capsule (18 mcg total) into inhaler and inhale daily.      VENTOLIN HFA 108 (90 BASE) MCG/ACT inhaler   Generic drug: albuterol   Inhale 2 puffs  into the lungs as needed. Shortness of breath        Follow-up Information    Follow up with [name], PA-C. Schedule an appointment as soon as possible for a visit in 2 weeks.   Contact information:   [facility] [address] [place] [address] [phone]       Follow up with HAWKINS,EDWARD L, MD. (for pulmonary function tests as scheduled)    Contact information:   [address] [place] [address] [phone]           The results of significant diagnostics from this hospitalization (including imaging, microbiology, ancillary and laboratory) are listed below for reference.    Significant Diagnostic Studies: Dg Chest 2 View  11/10/2011  *RADIOLOGY REPORT*  Clinical Data: Short of breath  CHEST - 2 VIEW  Comparison: Yesterday  Findings: Moderate cardiomegaly.  Vascular congestion.  Stable mild basilar edema.  No pneumothorax. No pleural effusion.  Stable thoracic spine.  IMPRESSION: Stable mild CHF.   Original Report Authenticated By: Donavan Burnet, M.D.    Dg Chest Portable 1 View  11/09/2011  *RADIOLOGY REPORT*  Clinical Data: Chest pain shortness of breath.  Former smoker. COPD.  Diabetes.  PORTABLE CHEST - 1 VIEW  Comparison: 02/18/2011.  Findings: Cardiomegaly.  Interstitial and alveolar prominence consistent with pulmonary edema.  No pneumothorax.  Degenerative change thoracic spine.  Worsening appearance from priors.  IMPRESSION: Cardiomegaly.  Pulmonary edema.  Worsening aeration.   Original Report Authenticated By: Elsie Stain, M.D.     Microbiology: No results found for this or any previous visit (from the past 240 hour(s)).   Labs: Basic Metabolic Panel:  Lab 11/11/11 9147 11/10/11 0548 11/09/11 2140  NA 141 140 137  K 4.0 4.5 3.9  CL 100 100 99  CO2 34* 32 29  GLUCOSE 87 165* 128*  BUN 18 15 16   CREATININE 0.67 0.75 0.69  CALCIUM 9.4 9.1 9.3  MG -- -- --  PHOS -- -- --   Liver  Function Tests: No results found for this basename: AST:5,ALT:5,ALKPHOS:5,BILITOT:5,PROT:5,ALBUMIN:5 in the last 168 hours No results found for this basename: LIPASE:5,AMYLASE:5 in the last 168 hours No results found for this basename: AMMONIA:5 in the last 168 hours CBC:  Lab 11/11/11 0531 11/10/11 0548 11/09/11 2140  WBC 14.4* 13.0* 13.9*  NEUTROABS -- -- 9.4*  HGB 12.1 11.7* 11.9*  HCT 37.6 36.0 36.3  MCV 92.4 92.1 91.2  PLT 285 282 295   Cardiac Enzymes:  Lab 11/10/11 1243 11/10/11 0548 11/10/11 0103 11/09/11 2140  CKTOTAL -- -- -- --  CKMB -- -- -- --  CKMBINDEX -- -- -- --  TROPONINI <0.30 <0.30 <0.30 <0.30   BNP: BNP (last 3 results)  Basename 11/09/11 2140 02/19/11 0529 02/18/11 2341  PROBNP 188.7* 130.6* 173.7*   CBG:  Lab 11/12/11 1635 11/12/11 1405 11/12/11 1131 11/12/11 0727 11/11/11 1636  GLUCAP 93 82 130* 86 95    Time coordinating discharge: Greater than 30 minutes  Signed:  Calven Gilkes  [facility] 11/12/2011, 6:15 PM

## 2011-11-12 NOTE — Progress Notes (Addendum)
Nutrition Education Note  RD consulted for nutrition education regarding diabetes and morbid obesity. Pt with multiple medical problems. RN in progression stated that pt refused CHF and COPD teaching.   Pt familiar to me. She and her husband attended [facility] diabetes class on 08/13/10. She reports disordered meal schedule, stating she tries to eat 3 meals a day, but often times doesn't due to her work schedule or when she does not feel hungry. She reports frustration with diet restrictions, because everyone tells her something different. She reports that she has been taking diabetes education classes for 22 years and the information always changes. She reports she has decreased adding salt to foods. She reports difficulty with physical activity, due to poor endurance and breathing troubles. She reports that she was once a candidate for bariatric surgery, but did not follow through with the surgery and expressed regret about this decision.   RD provided "Heart Healthy Nutrition Therapy" handout from the Academy of [employer]. Reviewed patient's dietary recall. Provided examples on ways to decrease sodium and fat intake in diet. Discouraged intake of processed foods and use of salt shaker. Encouraged fresh fruits and vegetables as well as whole grain sources of carbohydrates to maximize fiber intake.  RD also provided "Type 2 Diabetes Nutrition Therapy" handout from the Academy of [employer]etics. Also provided plate method handout. Discussed different food groups and their effects on blood sugar, emphasizing carbohydrate-containing foods. Provided list of carbohydrates and recommended serving sizes of common foods.  Discussed importance of controlled and consistent carbohydrate intake throughout the day. Provided examples of ways to balance meals/snacks and encouraged intake of high-fiber, whole grain complex carbohydrates.  Most of visit was spent counseling pt on importance of lifestyle  modifications to improve health status. Discussed with pt and husband how positive lifetsyle modifications (low salt diet, consistent meal intake, regular physical activity) can assist with weight loss, controlling health conditions, and improving overall quality of life. Identified ways to improve current lifestyle, but pt hesitant to change current habits.   Expect fair to poor compliance. Teach back method was used to assess understanding. Pt comprehends material, but is not ready for lifestyle change.   Body mass index is 57.93 kg/(m^2). Pt meets criteria for extreme obesity, class III based on current BMI.  Current diet order is carb modified, patient is consuming approximately 75-100% of meals at this time. Labs and medications reviewed. No further nutrition interventions warranted at this time. RD contact information provided. If additional nutrition issues arise, please re-consult RD.  Melody Haver, RD, LDN Pager: [phone]

## 2011-11-16 ENCOUNTER — Ambulatory Visit (HOSPITAL_COMMUNITY)
Admit: 2011-11-16 | Discharge: 2011-11-16 | Disposition: A | Discharge status: Home / Self Care | Payer: Medicaid Other | Attending: Pulmonary Disease | Admitting: Pulmonary Disease

## 2011-11-16 DIAGNOSIS — J449 Chronic obstructive pulmonary disease, unspecified: Secondary | ICD-10-CM | POA: Insufficient documentation

## 2011-11-16 DIAGNOSIS — J4489 Other specified chronic obstructive pulmonary disease: Secondary | ICD-10-CM | POA: Insufficient documentation

## 2011-11-16 MED ORDER — ALBUTEROL SULFATE (5 MG/ML) 0.5% IN NEBU
2.5000 mg | INHALATION_SOLUTION | Freq: Once | RESPIRATORY_TRACT | Status: AC
  Administered 2011-11-16: 2.5 mg via RESPIRATORY_TRACT

## 2011-11-26 NOTE — Procedures (Signed)
NAME:  AMAREA, MACDOWELL               ACCOUNT NO.:  [id number]  MEDICAL RECORD NO.:  [id number]  LOCATION:                                 FACILITY:  PHYSICIAN:  Robertt Buda L. Juanetta Gosling, M.D.DATE OF BIRTH:  November 29, 1965  DATE OF PROCEDURE: DATE OF DISCHARGE:                           PULMONARY FUNCTION TEST   Reason for pulmonary function testing is COPD.  1. Spirometry shows a severe ventilatory defect with some airflow     obstruction. 2. Lung volumes show mild reduction of total lung capacity. 3. DLCO is mildly reduced. 4. Airflow airway resistance is minimally elevated. 5. This study is consistent with clinical diagnosis of COPD.     Loistine Eberlin L. Juanetta Gosling, M.D.     [facility]/MEDQ  D:  11/24/2011  T:  11/25/2011  Job:  [id number]

## 2011-12-07 LAB — PULMONARY FUNCTION TEST

## 2012-01-12 ENCOUNTER — Other Ambulatory Visit (HOSPITAL_COMMUNITY): Payer: Self-pay | Admitting: Physician Assistant

## 2012-01-12 DIAGNOSIS — Z139 Encounter for screening, unspecified: Secondary | ICD-10-CM

## 2012-02-20 ENCOUNTER — Encounter (HOSPITAL_COMMUNITY): Payer: Self-pay | Admitting: Emergency Medicine

## 2012-02-20 ENCOUNTER — Emergency Department (HOSPITAL_COMMUNITY)
Admission: EM | Admit: 2012-02-20 | Discharge: 2012-02-20 | Disposition: A | Discharge status: Home / Self Care | Payer: Medicaid Other | Attending: Emergency Medicine | Admitting: Emergency Medicine

## 2012-02-20 DIAGNOSIS — Z87891 Personal history of nicotine dependence: Secondary | ICD-10-CM | POA: Insufficient documentation

## 2012-02-20 DIAGNOSIS — I10 Essential (primary) hypertension: Secondary | ICD-10-CM | POA: Insufficient documentation

## 2012-02-20 DIAGNOSIS — E119 Type 2 diabetes mellitus without complications: Secondary | ICD-10-CM | POA: Insufficient documentation

## 2012-02-20 DIAGNOSIS — R040 Epistaxis: Secondary | ICD-10-CM

## 2012-02-20 DIAGNOSIS — Z79899 Other long term (current) drug therapy: Secondary | ICD-10-CM | POA: Insufficient documentation

## 2012-02-20 DIAGNOSIS — Z8719 Personal history of other diseases of the digestive system: Secondary | ICD-10-CM | POA: Insufficient documentation

## 2012-02-20 DIAGNOSIS — I2510 Atherosclerotic heart disease of native coronary artery without angina pectoris: Secondary | ICD-10-CM | POA: Insufficient documentation

## 2012-02-20 DIAGNOSIS — Z8701 Personal history of pneumonia (recurrent): Secondary | ICD-10-CM | POA: Insufficient documentation

## 2012-02-20 DIAGNOSIS — J449 Chronic obstructive pulmonary disease, unspecified: Secondary | ICD-10-CM | POA: Insufficient documentation

## 2012-02-20 DIAGNOSIS — Z794 Long term (current) use of insulin: Secondary | ICD-10-CM | POA: Insufficient documentation

## 2012-02-20 DIAGNOSIS — J4489 Other specified chronic obstructive pulmonary disease: Secondary | ICD-10-CM | POA: Insufficient documentation

## 2012-02-20 DIAGNOSIS — E785 Hyperlipidemia, unspecified: Secondary | ICD-10-CM | POA: Insufficient documentation

## 2012-02-20 NOTE — ED Notes (Signed)
Patient given a drink per MD approval. 

## 2012-02-20 NOTE — ED Notes (Signed)
Patient with c/o epistaxis today. Had bleeding under control, blew nose, bleeding resumed. Takes plavix. Alert/oriented x4.

## 2012-02-20 NOTE — ED Provider Notes (Signed)
History     CSN: [id number]  Arrival date & time 02/20/12  1320   First MD Initiated Contact with Patient 02/20/12 1423      Chief Complaint  Patient presents with  . Epistaxis    (Consider location/radiation/quality/duration/timing/severity/associated sxs/prior treatment) HPI This 89 female had a spontaneous nosebleed atraumatic it home yesterday which resolved with local pressure, today her nose bleed returned and stopped then she blew her nose and started bleeding again so she came to the ED for evaluation. She is no lightheadedness no chest pain no cough no abdominal pain no vomiting no difficulty breathing. Treatment prior to arrival consisted of local pressure. Bleeding is resolved upon arrival to the ED. Past Medical History  Diagnosis Date  . Essential hypertension, benign   . Type 2 diabetes mellitus   . Morbid obesity   . Dyslipidemia   . Coronary atherosclerosis of native coronary artery     Diagnosed [place] 2006 - DES RCA, reports followup cath 2009 at [facility]   . COPD (chronic obstructive pulmonary disease)   . CAP (community acquired pneumonia)     Streptococcus 01/2011  . Pancreatitis     Past Surgical History  Procedure Date  . Abdominal hysterectomy   . Tonsillectomy   . Knee surgery   . Pilonidal cystectomy   . Nose surgery   . Abdominal hernia repair   . Coronary angioplasty with stent placement 2006    Family History  Problem Relation Age of Onset  . Diabetes Mother   . Heart failure Mother   . Hypertension Mother   . Kidney failure Mother     History  Substance Use Topics  . Smoking status: Former Smoker -- 20 years    Types: Cigarettes    Quit date: 08/11/2004  . Smokeless tobacco: Never Used  . Alcohol Use: No    OB History    Grav Para Term Preterm Abortions TAB SAB Ect Mult Living                  Review of Systems 10 Systems reviewed and are negative for acute change except as noted in the HPI. Allergies  Aspirin; Bee  venom; Food; and E-mycin  Home Medications   Current Outpatient Rx  Name  Route  Sig  Dispense  Refill  . ALBUTEROL SULFATE HFA 108 (90 BASE) MCG/ACT IN AERS   Inhalation   Inhale 2 puffs into the lungs as needed. Shortness of breath         . CITALOPRAM HYDROBROMIDE 20 MG PO TABS   Oral   Take 20 mg by mouth daily.         Marland Kitchen CLOPIDOGREL BISULFATE 75 MG PO TABS   Oral   Take 75 mg by mouth daily.           . OMEGA-3 FATTY ACIDS 1000 MG PO CAPS   Oral   Take 1 g by mouth 2 (two) times daily.          . FUROSEMIDE 40 MG PO TABS   Oral   Take 1 tablet (40 mg total) by mouth 2 (two) times daily.   60 tablet   1   . INSULIN ASPART 100 UNIT/ML [place] SOLN   Subcutaneous   Inject 50 Units into the skin 3 (three) times daily before meals.           . INSULIN GLARGINE 100 UNIT/ML [place] SOLN   Subcutaneous   Inject 70-75 Units into the skin  2 (two) times daily. Take70 units in the morning and 75 units at bedtime         . LISINOPRIL 20 MG PO TABS   Oral   Take 20 mg by mouth daily.           Marland Kitchen METOPROLOL TARTRATE 50 MG PO TABS   Oral   Take 50 mg by mouth 2 (two) times daily.           Marland Kitchen PRAVASTATIN SODIUM 40 MG PO TABS   Oral   Take 40 mg by mouth at bedtime.          Marland Kitchen TIOTROPIUM BROMIDE MONOHYDRATE 18 MCG IN CAPS   Inhalation   Place 1 capsule (18 mcg total) into inhaler and inhale daily.   30 capsule   1     BP 142/66  Pulse 73  Temp 97.7 F (36.5 C) (Oral)  Resp 21  Ht 5\' 3"  (1.6 m)  Wt 310 lb (140.615 kg)  BMI 54.91 kg/m2  SpO2 95%  Physical Exam  Nursing note and vitals reviewed. Constitutional:       Awake, alert, nontoxic appearance.  HENT:  Head: Atraumatic.       Bilateral septal mucosa minor bleeding with airway patent  Eyes: Right eye exhibits no discharge. Left eye exhibits no discharge.  Neck: Neck supple.  Cardiovascular: Normal rate and regular rhythm.   No murmur heard. Pulmonary/Chest: Effort normal and breath sounds  normal. No respiratory distress. She has no wheezes. She has no rales. She exhibits no tenderness.  Abdominal: Soft. There is no tenderness. There is no rebound.  Musculoskeletal: She exhibits no tenderness.       Baseline ROM, no obvious new focal weakness.  Neurological:       Mental status and motor strength appears baseline for patient and situation.  Skin: No rash noted.  Psychiatric: She has a normal mood and affect.    ED Course  Procedures (including critical care time) Procedure: Application of QR powder to the bilateral nasal septal mucosa initially controlled patient's epistaxis. Patient tolerated this well with no apparent immediate complications. 1445  Labs Reviewed - No data to display No results found.   1. Epistaxis       MDM  Patient / Family / Caregiver informed of clinical course, understand medical decision-making process, and agree with plan.       Hurman Horn, MD 02/20/12 [phone]

## 2012-02-20 NOTE — ED Notes (Signed)
Patient would like something to drink. Patient was informed she could not have anything until she is seen by the doctor.

## 2012-02-20 NOTE — ED Notes (Signed)
Patient with no complaints at this time. Respirations even and unlabored. Skin warm/dry. Discharge instructions reviewed with patient at this time. Patient given opportunity to voice concerns/ask questions. Patient discharged at this time and left Emergency Department with steady gait.   

## 2012-02-20 NOTE — ED Notes (Signed)
Patient is resting comfortably. 

## 2012-03-06 ENCOUNTER — Ambulatory Visit (HOSPITAL_COMMUNITY): Payer: Self-pay

## 2012-03-21 ENCOUNTER — Ambulatory Visit (HOSPITAL_COMMUNITY): Payer: Self-pay

## 2012-04-10 ENCOUNTER — Emergency Department (HOSPITAL_COMMUNITY): Payer: Medicaid Other

## 2012-04-10 ENCOUNTER — Inpatient Hospital Stay (HOSPITAL_COMMUNITY)
Admission: EM | Admit: 2012-04-10 | Discharge: 2012-04-11 | DRG: 069 | Disposition: A | Discharge status: Home / Self Care | Payer: Medicaid Other | Attending: Internal Medicine | Admitting: Internal Medicine

## 2012-04-10 ENCOUNTER — Inpatient Hospital Stay (HOSPITAL_COMMUNITY): Payer: Medicaid Other

## 2012-04-10 ENCOUNTER — Encounter (HOSPITAL_COMMUNITY): Payer: Self-pay | Admitting: *Deleted

## 2012-04-10 DIAGNOSIS — R0789 Other chest pain: Secondary | ICD-10-CM | POA: Diagnosis present

## 2012-04-10 DIAGNOSIS — Z9861 Coronary angioplasty status: Secondary | ICD-10-CM

## 2012-04-10 DIAGNOSIS — I2510 Atherosclerotic heart disease of native coronary artery without angina pectoris: Secondary | ICD-10-CM

## 2012-04-10 DIAGNOSIS — E119 Type 2 diabetes mellitus without complications: Secondary | ICD-10-CM | POA: Diagnosis present

## 2012-04-10 DIAGNOSIS — Z8701 Personal history of pneumonia (recurrent): Secondary | ICD-10-CM

## 2012-04-10 DIAGNOSIS — Z91038 Other insect allergy status: Secondary | ICD-10-CM

## 2012-04-10 DIAGNOSIS — I10 Essential (primary) hypertension: Secondary | ICD-10-CM | POA: Diagnosis present

## 2012-04-10 DIAGNOSIS — Z79899 Other long term (current) drug therapy: Secondary | ICD-10-CM

## 2012-04-10 DIAGNOSIS — J449 Chronic obstructive pulmonary disease, unspecified: Secondary | ICD-10-CM | POA: Diagnosis present

## 2012-04-10 DIAGNOSIS — Z7902 Long term (current) use of antithrombotics/antiplatelets: Secondary | ICD-10-CM

## 2012-04-10 DIAGNOSIS — E669 Obesity, unspecified: Secondary | ICD-10-CM | POA: Diagnosis present

## 2012-04-10 DIAGNOSIS — Z6841 Body Mass Index (BMI) 40.0 and over, adult: Secondary | ICD-10-CM

## 2012-04-10 DIAGNOSIS — J189 Pneumonia, unspecified organism: Secondary | ICD-10-CM

## 2012-04-10 DIAGNOSIS — E785 Hyperlipidemia, unspecified: Secondary | ICD-10-CM | POA: Diagnosis present

## 2012-04-10 DIAGNOSIS — I5031 Acute diastolic (congestive) heart failure: Secondary | ICD-10-CM

## 2012-04-10 DIAGNOSIS — Z23 Encounter for immunization: Secondary | ICD-10-CM

## 2012-04-10 DIAGNOSIS — Z87891 Personal history of nicotine dependence: Secondary | ICD-10-CM

## 2012-04-10 DIAGNOSIS — R0600 Dyspnea, unspecified: Secondary | ICD-10-CM

## 2012-04-10 DIAGNOSIS — E1169 Type 2 diabetes mellitus with other specified complication: Secondary | ICD-10-CM | POA: Diagnosis present

## 2012-04-10 DIAGNOSIS — Z794 Long term (current) use of insulin: Secondary | ICD-10-CM

## 2012-04-10 DIAGNOSIS — Z883 Allergy status to other anti-infective agents status: Secondary | ICD-10-CM

## 2012-04-10 DIAGNOSIS — J4489 Other specified chronic obstructive pulmonary disease: Secondary | ICD-10-CM | POA: Diagnosis present

## 2012-04-10 DIAGNOSIS — Z886 Allergy status to analgesic agent status: Secondary | ICD-10-CM

## 2012-04-10 DIAGNOSIS — G459 Transient cerebral ischemic attack, unspecified: Principal | ICD-10-CM | POA: Diagnosis present

## 2012-04-10 LAB — CBC WITH DIFFERENTIAL/PLATELET
Basophils Relative: 1 % (ref 0–1)
Eosinophils Relative: 3 % (ref 0–5)
HCT: 39.2 % (ref 36.0–46.0)
MCHC: 32.4 g/dL (ref 30.0–36.0)
MCV: 91.8 fL (ref 78.0–100.0)
Monocytes Absolute: 0.9 10*3/uL (ref 0.1–1.0)
Neutro Abs: 9 10*3/uL — ABNORMAL HIGH (ref 1.7–7.7)
RDW: 15.5 % (ref 11.5–15.5)

## 2012-04-10 LAB — BASIC METABOLIC PANEL
BUN: 19 mg/dL (ref 6–23)
CO2: 33 mEq/L — ABNORMAL HIGH (ref 19–32)
Chloride: 98 mEq/L (ref 96–112)
GFR calc non Af Amer: >90 mL/min (ref >90)
Glucose, Bld: 183 mg/dL — ABNORMAL HIGH (ref 70–99)
Potassium: 4.1 mEq/L (ref 3.5–5.1)
Sodium: 140 mEq/L (ref 135–145)

## 2012-04-10 LAB — D-DIMER, QUANTITATIVE: D-Dimer, Quant: 0.45 ug/mL-FEU (ref 0.00–0.48)

## 2012-04-10 LAB — GLUCOSE, CAPILLARY: Glucose-Capillary: 165 mg/dL — ABNORMAL HIGH (ref 70–99)

## 2012-04-10 MED ORDER — INSULIN GLARGINE 100 UNIT/ML ~~LOC~~ SOLN: 70.0000 [IU] | Freq: Two times a day (BID) | SUBCUTANEOUS | Status: DC

## 2012-04-10 MED ORDER — LISINOPRIL 10 MG PO TABS
20.0000 mg | ORAL_TABLET | Freq: Two times a day (BID) | ORAL | Status: DC
  Administered 2012-04-10 – 2012-04-11 (×2): 20 mg via ORAL
  Filled 2012-04-10 (×2): qty 2

## 2012-04-10 MED ORDER — ALBUTEROL SULFATE HFA 108 (90 BASE) MCG/ACT IN AERS
2.0000 | INHALATION_SPRAY | RESPIRATORY_TRACT | Status: DC | PRN
  Administered 2012-04-11 (×2): 2 via RESPIRATORY_TRACT
  Filled 2012-04-10: qty 6.7

## 2012-04-10 MED ORDER — CITALOPRAM HYDROBROMIDE 20 MG PO TABS
20.0000 mg | ORAL_TABLET | Freq: Every day | ORAL | Status: DC
  Administered 2012-04-10 – 2012-04-11 (×2): 20 mg via ORAL
  Filled 2012-04-10 (×2): qty 1

## 2012-04-10 MED ORDER — SODIUM CHLORIDE 0.9 % IJ SOLN: 3.0000 mL | INTRAMUSCULAR | Status: DC | PRN

## 2012-04-10 MED ORDER — HEPARIN SODIUM (PORCINE) 5000 UNIT/ML IJ SOLN
5000.0000 [IU] | Freq: Three times a day (TID) | INTRAMUSCULAR | Status: DC
  Administered 2012-04-10 – 2012-04-11 (×3): 5000 [IU] via SUBCUTANEOUS
  Filled 2012-04-10 (×3): qty 1

## 2012-04-10 MED ORDER — CLOPIDOGREL BISULFATE 75 MG PO TABS: 75.0000 mg | ORAL_TABLET | Freq: Every day | ORAL | Status: DC

## 2012-04-10 MED ORDER — INSULIN ASPART 100 UNIT/ML ~~LOC~~ SOLN
50.0000 [IU] | Freq: Three times a day (TID) | SUBCUTANEOUS | Status: DC
  Administered 2012-04-11 (×2): 50 [IU] via SUBCUTANEOUS

## 2012-04-10 MED ORDER — TIOTROPIUM BROMIDE MONOHYDRATE 18 MCG IN CAPS
18.0000 ug | ORAL_CAPSULE | Freq: Every day | RESPIRATORY_TRACT | Status: DC
  Filled 2012-04-10: qty 5

## 2012-04-10 MED ORDER — ALBUTEROL SULFATE (5 MG/ML) 0.5% IN NEBU
2.5000 mg | INHALATION_SOLUTION | Freq: Once | RESPIRATORY_TRACT | Status: AC
Start: 2012-04-10 — End: 2012-04-10
  Administered 2012-04-10: 2.5 mg via RESPIRATORY_TRACT
  Filled 2012-04-10: qty 0.5

## 2012-04-10 MED ORDER — INSULIN GLARGINE 100 UNIT/ML ~~LOC~~ SOLN
70.0000 [IU] | Freq: Every day | SUBCUTANEOUS | Status: DC
  Administered 2012-04-11: 70 [IU] via SUBCUTANEOUS

## 2012-04-10 MED ORDER — MOMETASONE FURO-FORMOTEROL FUM 100-5 MCG/ACT IN AERO
2.0000 | INHALATION_SPRAY | Freq: Two times a day (BID) | RESPIRATORY_TRACT | Status: DC
  Administered 2012-04-11 (×2): 2 via RESPIRATORY_TRACT
  Filled 2012-04-10: qty 8.8

## 2012-04-10 MED ORDER — FUROSEMIDE 40 MG PO TABS: 40.0000 mg | ORAL_TABLET | Freq: Two times a day (BID) | ORAL | Status: DC

## 2012-04-10 MED ORDER — METOPROLOL TARTRATE 50 MG PO TABS
50.0000 mg | ORAL_TABLET | Freq: Two times a day (BID) | ORAL | Status: DC
  Administered 2012-04-10 – 2012-04-11 (×2): 50 mg via ORAL
  Filled 2012-04-10 (×2): qty 1

## 2012-04-10 MED ORDER — IPRATROPIUM BROMIDE 0.02 % IN SOLN
0.5000 mg | Freq: Once | RESPIRATORY_TRACT | Status: AC
Start: 2012-04-10 — End: 2012-04-10
  Administered 2012-04-10: 0.5 mg via RESPIRATORY_TRACT
  Filled 2012-04-10: qty 2.5

## 2012-04-10 MED ORDER — INSULIN GLARGINE 100 UNIT/ML ~~LOC~~ SOLN
75.0000 [IU] | Freq: Every day | SUBCUTANEOUS | Status: DC
  Administered 2012-04-10: 75 [IU] via SUBCUTANEOUS

## 2012-04-10 MED ORDER — CLOPIDOGREL BISULFATE 75 MG PO TABS
75.0000 mg | ORAL_TABLET | Freq: Every day | ORAL | Status: DC
  Administered 2012-04-11: 75 mg via ORAL
  Filled 2012-04-10: qty 1

## 2012-04-10 MED ORDER — FUROSEMIDE 40 MG PO TABS
40.0000 mg | ORAL_TABLET | Freq: Two times a day (BID) | ORAL | Status: DC
  Administered 2012-04-11 (×2): 40 mg via ORAL
  Filled 2012-04-10 (×2): qty 1

## 2012-04-10 MED ORDER — SODIUM CHLORIDE 0.9 % IJ SOLN
3.0000 mL | Freq: Two times a day (BID) | INTRAMUSCULAR | Status: DC
  Administered 2012-04-11: 3 mL via INTRAVENOUS

## 2012-04-10 MED ORDER — MOMETASONE FURO-FORMOTEROL FUM 100-5 MCG/ACT IN AERO
INHALATION_SPRAY | RESPIRATORY_TRACT | Status: AC
  Filled 2012-04-10: qty 8.8

## 2012-04-10 MED ORDER — PNEUMOCOCCAL VAC POLYVALENT 25 MCG/0.5ML IJ INJ
0.5000 mL | INJECTION | INTRAMUSCULAR | Status: AC
  Administered 2012-04-11: 0.5 mL via INTRAMUSCULAR
  Filled 2012-04-10: qty 0.5

## 2012-04-10 MED ORDER — SIMVASTATIN 20 MG PO TABS
20.0000 mg | ORAL_TABLET | Freq: Every day | ORAL | Status: DC
  Administered 2012-04-10 – 2012-04-11 (×2): 20 mg via ORAL
  Filled 2012-04-10 (×2): qty 1

## 2012-04-10 MED ORDER — INSULIN ASPART 100 UNIT/ML ~~LOC~~ SOLN
0.0000 [IU] | Freq: Every day | SUBCUTANEOUS | Status: DC
  Administered 2012-04-10: 2 [IU] via SUBCUTANEOUS

## 2012-04-10 MED ORDER — SODIUM CHLORIDE 0.9 % IV SOLN: 250.0000 mL | INTRAVENOUS | Status: DC | PRN

## 2012-04-10 MED ORDER — INSULIN ASPART 100 UNIT/ML ~~LOC~~ SOLN
0.0000 [IU] | Freq: Three times a day (TID) | SUBCUTANEOUS | Status: DC
  Administered 2012-04-11: 3 [IU] via SUBCUTANEOUS
  Administered 2012-04-11: 4 [IU] via SUBCUTANEOUS

## 2012-04-10 NOTE — H&P (Signed)
[facility] History and Physical  Nicole [name] NWG:[id number] DOB: March 06, 1965 DOA: 04/10/2012  Referring physician: ER physician. PCP: [name], PA-C    Chief Complaint: Left-sided weakness and tingling.  HPI: Nicole [name] is a 47 y.o. female who presents to the hospital with 2 episodes of left-sided weakness affecting the arm and the leg and associated tingling. She also had difficulty with speech on the second occasion, finding it difficult to find words. The first episode lasted 10 minutes and it was 2 days ago. The second episode lasted 20 minutes and it was yesterday. This morning she experienced left-sided chest pain which was sharp and therefore she came to the emergency room. She's never had a stroke in the past. She does have a history of coronary artery disease. She also has hypertension, diabetes, hyperlipidemia and is morbidly obese. She is a nonsmoker. She could smoking 10 years ago.   Review of Systems: Apart from history of present illness, other systems negative.  Past Medical History  Diagnosis Date  . Essential hypertension, benign   . Type 2 diabetes mellitus   . Morbid obesity   . Dyslipidemia   . Coronary atherosclerosis of native coronary artery     Diagnosed [place] 2006 - DES RCA, reports followup cath 2009 at [facility]   . COPD (chronic obstructive pulmonary disease)   . CAP (community acquired pneumonia)     Streptococcus 01/2011  . Pancreatitis    Past Surgical History  Procedure Laterality Date  . Abdominal hysterectomy    . Tonsillectomy    . Knee surgery    . Pilonidal cystectomy    . Nose surgery    . Coronary angioplasty with stent placement  2006  . Abdominal hernia repair     Social History:  She is married and lives with her husband. She does not smoke cigarettes now. She quit smoking 10 years ago. She does not drink alcohol. She is fully independent.  Allergies  Allergen Reactions  . Aspirin Anaphylaxis  . Bee Venom  Anaphylaxis  . Food Anaphylaxis    PINEAPPLE  . E-Mycin (Erythromycin Base) Nausea And Vomiting    Family History  Problem Relation Age of Onset  . Diabetes Mother   . Heart failure Mother   . Hypertension Mother   . Kidney failure Mother       Prior to Admission medications   Medication Sig Start Date End Date Taking? Authorizing Provider  albuterol (VENTOLIN HFA) 108 (90 BASE) MCG/ACT inhaler Inhale 2 puffs into the lungs as needed. Shortness of breath   Yes Historical Provider, MD  citalopram (CELEXA) 20 MG tablet Take 20 mg by mouth daily.   Yes Historical Provider, MD  clopidogrel (PLAVIX) 75 MG tablet Take 75 mg by mouth daily.     Yes Historical Provider, MD  Fluticasone-Salmeterol (ADVAIR) 250-50 MCG/DOSE AEPB Inhale 1 puff into the lungs every 12 (twelve) hours.   Yes Historical Provider, MD  furosemide (LASIX) 40 MG tablet Take 1 tablet (40 mg total) by mouth 2 (two) times daily. 11/12/11  Yes Erick Blinks, MD  insulin aspart (NOVOLOG) 100 UNIT/ML injection Inject 50 Units into the skin 3 (three) times daily before meals.     Yes Historical Provider, MD  insulin glargine (LANTUS) 100 UNIT/ML injection Inject 70-75 Units into the skin 2 (two) times daily. Take70 units in the morning and 75 units at bedtime   Yes Historical Provider, MD  lisinopril (PRINIVIL,ZESTRIL) 20 MG tablet Take 20 mg by  mouth 2 (two) times daily.    Yes Historical Provider, MD  metoprolol (LOPRESSOR) 50 MG tablet Take 50 mg by mouth 2 (two) times daily.     Yes Historical Provider, MD  pravastatin (PRAVACHOL) 40 MG tablet Take 40 mg by mouth at bedtime.    Yes Historical Provider, MD  tiotropium (SPIRIVA) 18 MCG inhalation capsule Place 1 capsule (18 mcg total) into inhaler and inhale daily. 11/12/11  Yes Erick Blinks, MD   Physical Exam: Filed Vitals:   04/10/12 1436 04/10/12 1442 04/10/12 1658 04/10/12 1725  BP: 120/42  132/56 133/63  Pulse: 72  76 75  Temp: 98 F (36.7 C) 98.6 F (37 C)     TempSrc: Oral     Resp: 20  17 18   Height: 5\' 3"  (1.6 m)     Weight: 156.491 kg (345 lb)     SpO2: 96%  96% 96%     General:  She looks systemically well. She is obese.  Eyes: No pallor. No jaundice.  ENT: No major abnormalities.  Neck: Thick neck. No lymphadenopathy.  Cardiovascular: Heart sounds are present without gallop rhythm or murmurs. Possible left carotid bruit.  Respiratory: Lung fields are clear.  Abdomen: Soft, nontender.  Skin: No rash.  Musculoskeletal: Left anterior chest wall pain, reproducing the pain she describes this morning.  Psychiatric: Appropriate affect.  Neurologic: Alert and orientated. No facial asymmetry. No weakness in the arms or legs.  Labs on Admission:  Basic Metabolic Panel:  Recent Labs Lab 04/10/12 1459  NA 140  K 4.1  CL 98  CO2 33*  GLUCOSE 183*  BUN 19  CREATININE 0.73  CALCIUM 9.5       CBC:  Recent Labs Lab 04/10/12 1459  WBC 13.9*  NEUTROABS 9.0*  HGB 12.7  HCT 39.2  MCV 91.8  PLT 309   Cardiac Enzymes:  Recent Labs Lab 04/10/12 1459  TROPONINI <0.30    BNP (last 3 results)  Recent Labs  11/09/11 2140  PROBNP 188.7*   CBG:  Recent Labs Lab 04/10/12 1505  GLUCAP 165*    Radiological Exams on Admission: Ct Head Wo Contrast  04/10/2012  *RADIOLOGY REPORT*  Clinical Data: Shortness of breath.  The left-sided body numbness, tingling, and weakness for 2 days with episodes of slurred speech.  CT HEAD WITHOUT CONTRAST  Technique:  Contiguous axial images were obtained from the base of the skull through the vertex without contrast.  Comparison: None.  Findings: No acute intracranial abnormality is present. Specifically, there is no evidence for acute infarct, hemorrhage, mass, hydrocephalus, or extra-axial fluid collection.  The paranasal sinuses and mastoid air cells are clear.  The globes and orbits are intact.  The osseous skull is intact.  IMPRESSION: Negative CT of the head.   Original  Report Authenticated By: Marin Roberts, M.D.    Dg Chest Portable 1 View  04/10/2012  *RADIOLOGY REPORT*  Clinical Data: 47 year old female shortness of breath chest pain numbness.  PORTABLE CHEST - 1 VIEW  Comparison: 11/10/2011 and earlier.  Findings: AP portable upright view 1523 hours.  Stable lung volumes.  Stable cardiac size and mediastinal contours.  Visualized tracheal air column is within normal limits.  No pneumothorax. Vascular congestion without overt edema.  No pleural effusion or consolidation.  IMPRESSION: Vascular congestion without overt edema.   Original Report Authenticated By: Erskine Speed, M.D.       Assessment/Plan   1. TIAs. 2. Musculoskeletal left-sided chest pain. 3. Type 2 diabetes  mellitus. 4. Hypertension. 5. Hyperlipidemia. 6. Morbid obesity. 7. COPD, stable.  Plan: 1. Admit to telemetry. 2. Workup for TIA. 3. Serial troponin levels, although clinically I doubt she has myocardial ischemia or infarction on this admission. Further recommendations will depend on patient's hospital progress.  Code Status: Full code.  Family Communication: Discussed plan with patient at the bedside.   Disposition Plan: Home when medically stable.   Time spent: 45 minutes.  [name] [facility] Pager [phone].  If 7PM-7AM, please contact night-coverage [email or web address] Password [facility] 04/10/2012, 5:52 PM

## 2012-04-10 NOTE — ED Notes (Addendum)
Called CT , states there are 2 more patients in front of this pt for a ct. Pt awre. Pt resting. Nad. No changes. RT called for breathing tx

## 2012-04-10 NOTE — ED Notes (Signed)
Floor unable to take report. Asked to wait until 1915. Advised pt just received meal tray. Nad.

## 2012-04-10 NOTE — ED Provider Notes (Signed)
History     CSN: [id number]  Arrival date & time 04/10/12  1429   First MD Initiated Contact with Patient 04/10/12 1455      Chief Complaint  Patient presents with  . Shortness of Breath    (Consider location/radiation/quality/duration/timing/severity/associated sxs/prior treatment) HPI Comments: Patient comes to the ER with multiple complaints. Patient reports that she has had 2 separate episodes of left-sided numbness, tingling and slurred speech over the last 2 days. The first episode happened 2 days ago and lasted for 5 or 10 minutes. She reports that the entire left side of her body was numb, tingling and weak. She could not walk. After the symptoms resolved, she felt tired and laid down, when she woke was back to her normal self. She had a second similar episode last night that lasted about 15 minutes. Upon awakening today, however, she has noticed pain in the Center of her chest. It is a "nagging" pain that has been continuous for most of today. She is short of breath. She reports that pain worsens if he takes a deep breath.  Patient is a 47 y.o. female presenting with shortness of breath.  Shortness of Breath Associated symptoms: chest pain   Associated symptoms: no headaches     Past Medical History  Diagnosis Date  . Essential hypertension, benign   . Type 2 diabetes mellitus   . Morbid obesity   . Dyslipidemia   . Coronary atherosclerosis of native coronary artery     Diagnosed [place] 2006 - DES RCA, reports followup cath 2009 at [facility]   . COPD (chronic obstructive pulmonary disease)   . CAP (community acquired pneumonia)     Streptococcus 01/2011  . Pancreatitis     Past Surgical History  Procedure Laterality Date  . Abdominal hysterectomy    . Tonsillectomy    . Knee surgery    . Pilonidal cystectomy    . Nose surgery    . Coronary angioplasty with stent placement  2006  . Abdominal hernia repair      Family History  Problem Relation Age of Onset  .  Diabetes Mother   . Heart failure Mother   . Hypertension Mother   . Kidney failure Mother     History  Substance Use Topics  . Smoking status: Former Smoker -- 20 years    Types: Cigarettes    Quit date: 08/11/2004  . Smokeless tobacco: Never Used  . Alcohol Use: No    OB History   Grav Para Term Preterm Abortions TAB SAB Ect Mult Living                  Review of Systems  Respiratory: Positive for shortness of breath.   Cardiovascular: Positive for chest pain.  Neurological: Positive for weakness and numbness. Negative for syncope and headaches.  All other systems reviewed and are negative.    Allergies  Aspirin; Bee venom; Food; and E-mycin  Home Medications   Current Outpatient Rx  Name  Route  Sig  Dispense  Refill  . albuterol (VENTOLIN HFA) 108 (90 BASE) MCG/ACT inhaler   Inhalation   Inhale 2 puffs into the lungs as needed. Shortness of breath         . citalopram (CELEXA) 20 MG tablet   Oral   Take 20 mg by mouth daily.         . clopidogrel (PLAVIX) 75 MG tablet   Oral   Take 75 mg by mouth daily.           Marland Kitchen  fish oil-omega-3 fatty acids 1000 MG capsule   Oral   Take 1 g by mouth 2 (two) times daily.          . furosemide (LASIX) 40 MG tablet   Oral   Take 1 tablet (40 mg total) by mouth 2 (two) times daily.   60 tablet   1   . insulin aspart (NOVOLOG) 100 UNIT/ML injection   Subcutaneous   Inject 50 Units into the skin 3 (three) times daily before meals.           . insulin glargine (LANTUS) 100 UNIT/ML injection   Subcutaneous   Inject 70-75 Units into the skin 2 (two) times daily. Take70 units in the morning and 75 units at bedtime         . lisinopril (PRINIVIL,ZESTRIL) 20 MG tablet   Oral   Take 20 mg by mouth daily.           . metoprolol (LOPRESSOR) 50 MG tablet   Oral   Take 50 mg by mouth 2 (two) times daily.           . pravastatin (PRAVACHOL) 40 MG tablet   Oral   Take 40 mg by mouth at bedtime.           Marland Kitchen tiotropium (SPIRIVA) 18 MCG inhalation capsule   Inhalation   Place 1 capsule (18 mcg total) into inhaler and inhale daily.   30 capsule   1     BP 120/42  Pulse 72  Temp(Src) 98 F (36.7 C) (Oral)  Resp 20  Ht 5\' 3"  (1.6 m)  Wt 345 lb (156.491 kg)  BMI 61.13 kg/m2  SpO2 96%  Physical Exam  Constitutional: She is oriented to person, place, and time. She appears well-developed and well-nourished. No distress.  HENT:  Head: Normocephalic and atraumatic.  Right Ear: Hearing normal.  Nose: Nose normal.  Mouth/Throat: Oropharynx is clear and moist and mucous membranes are normal.  Eyes: Conjunctivae and EOM are normal. Pupils are equal, round, and reactive to light.  Neck: Normal range of motion. Neck supple.  Cardiovascular: Normal rate, regular rhythm, S1 normal and S2 normal.  Exam reveals no gallop and no friction rub.   No murmur heard. Pulmonary/Chest: Effort normal and breath sounds normal. No respiratory distress. She exhibits no tenderness.  Abdominal: Soft. Normal appearance and bowel sounds are normal. There is no hepatosplenomegaly. There is no tenderness. There is no rebound, no guarding, no tenderness at McBurney's point and negative Murphy's sign. No hernia.  Musculoskeletal: Normal range of motion.  Neurological: She is alert and oriented to person, place, and time. She has normal strength. No cranial nerve deficit or sensory deficit. Coordination normal. GCS eye subscore is 4. GCS verbal subscore is 5. GCS motor subscore is 6.  Skin: Skin is warm, dry and intact. No rash noted. No cyanosis.  Psychiatric: She has a normal mood and affect. Her speech is normal and behavior is normal. Thought content normal.    ED Course  Procedures (including critical care time)   Date: 04/10/2012  Rate: 70  Rhythm: normal sinus rhythm  QRS Axis: left  Intervals: normal  ST/T Wave abnormalities: nonspecific T wave changes  Conduction Disutrbances:right bundle branch  block and left anterior fascicular block  Narrative Interpretation:   Old EKG Reviewed: ant-septal T wave flattening new    Labs Reviewed  CBC WITH DIFFERENTIAL - Abnormal; Notable for the following:    WBC 13.9 (*)  Neutro Abs 9.0 (*)    All other components within normal limits  BASIC METABOLIC PANEL - Abnormal; Notable for the following:    CO2 33 (*)    Glucose, Bld 183 (*)    All other components within normal limits  GLUCOSE, CAPILLARY - Abnormal; Notable for the following:    Glucose-Capillary 165 (*)    All other components within normal limits  TROPONIN I  D-DIMER, QUANTITATIVE   Dg Chest Portable 1 View  04/10/2012  *RADIOLOGY REPORT*  Clinical Data: 47 year old female shortness of breath chest pain numbness.  PORTABLE CHEST - 1 VIEW  Comparison: 11/10/2011 and earlier.  Findings: AP portable upright view 1523 hours.  Stable lung volumes.  Stable cardiac size and mediastinal contours.  Visualized tracheal air column is within normal limits.  No pneumothorax. Vascular congestion without overt edema.  No pleural effusion or consolidation.  IMPRESSION: Vascular congestion without overt edema.   Original Report Authenticated By: Erskine Speed, M.D.      Diagnoses: 1. Chest pain 2. TIA    MDM  Patient presented to the ER for evaluation of chest pain and shortness of breath. Patient has a history of COPD and uses albuterol and spur either. The pain she is experiencing today is unusual, however, not generally associated with her COPD. Presentation EKG does have nonspecific anteroseptal T wave flattening, but otherwise unchanged. Troponin was negative. Patient also had a negative d-dimer.  In addition to the chest pain symptoms, patient has had 2 episodes over the last 2 days of symptoms that seem consistent with TIA. She had sudden onset of left-sided numbness, tingling, weakness and slurred speech lasted 10-15 minutes each and then resolved. She has not had any further symptoms  today. This will require further workup as well.  Patient will require hospitalization for further management of chest pain and TIA.        Gilda Crease, MD 04/10/12 [phone]

## 2012-04-10 NOTE — ED Notes (Addendum)
Floor unable to take report.  RN stated she would call back shortly.

## 2012-04-10 NOTE — ED Notes (Signed)
Had episodes of numbness , tingling of lt side of body since Saturday, sl numbness and tingling now.  Has noticed sob and chest pain,"stabbing with a knife."onset 1130am

## 2012-04-11 ENCOUNTER — Inpatient Hospital Stay (HOSPITAL_COMMUNITY): Payer: Medicaid Other

## 2012-04-11 LAB — GLUCOSE, CAPILLARY
Glucose-Capillary: 175 mg/dL — ABNORMAL HIGH (ref 70–99)
Glucose-Capillary: 143 mg/dL — ABNORMAL HIGH (ref 70–99)
Glucose-Capillary: 70 mg/dL (ref 70–99)

## 2012-04-11 LAB — HOMOCYSTEINE: Homocysteine: 10.6 umol/L (ref 4.0–15.4)

## 2012-04-11 LAB — TSH: TSH: 2.8 u[IU]/mL (ref 0.350–4.500)

## 2012-04-11 LAB — LIPID PANEL
HDL: 28 mg/dL — ABNORMAL LOW (ref >39)
LDL Cholesterol: 112 mg/dL — ABNORMAL HIGH (ref 0–99)
Triglycerides: 257 mg/dL — ABNORMAL HIGH (ref <150)
VLDL: 51 mg/dL — ABNORMAL HIGH (ref 0–40)

## 2012-04-11 LAB — RAPID URINE DRUG SCREEN, HOSP PERFORMED
Amphetamines: NOT DETECTED
Opiates: NOT DETECTED

## 2012-04-11 LAB — HEMOGLOBIN A1C: Hgb A1c MFr Bld: 6.9 % — ABNORMAL HIGH (ref <5.7)

## 2012-04-11 LAB — VITAMIN B12: Vitamin B-12: 382 pg/mL (ref 211–911)

## 2012-04-11 MED ORDER — PRAVASTATIN SODIUM 40 MG PO TABS: 80.0000 mg | ORAL_TABLET | Freq: Every day | ORAL | Status: DC

## 2012-04-11 MED ORDER — ACETAMINOPHEN 325 MG PO TABS
650.0000 mg | ORAL_TABLET | Freq: Four times a day (QID) | ORAL | Status: DC | PRN
  Administered 2012-04-11: 650 mg via ORAL
  Filled 2012-04-11: qty 2

## 2012-04-11 NOTE — Care Management Note (Signed)
    Page 1 of 1   04/11/2012     3:40:19 PM   CARE MANAGEMENT NOTE 04/11/2012  Patient:  Nicole [name]   Account Number:  [id number]  Date Initiated:  04/11/2012  Documentation initiated by:  Sharrie Rothman  Subjective/Objective Assessment:   Pt admittted from home with numbness and possilbe TIA. Pt lives with her husband and will return home at discharge. Pt is independent with ADL's.  Pt is currently following up with [facility].     Action/Plan:   Financial counselor is aware of self pay status. Pt stated that she would have insurance next month. CM may need to help with medications at discharge. Will continue to follow.   Anticipated DC Date:  04/12/2012   Anticipated DC Plan:  HOME/SELF CARE      DC Planning Services  CM consult      Choice offered to / List presented to:             Status of service:  Completed, signed off Medicare Important Message given?   (If response is "NO", the following Medicare IM given date fields will be blank) Date Medicare IM given:   Date Additional Medicare IM given:    Discharge Disposition:  HOME/SELF CARE  Per UR Regulation:    If discussed at Long Length of Stay Meetings, dates discussed:    Comments:  04/11/12 1535 Arlyss Queen, RN BSN CM CM explained the [employer] website to pt and encouraged pt to use the website for assistance with meds once new insurance provided coverage. Pt also follows up with [facility]. Pt discharged home today. No CM or HH needs noted.  04/11/12 1145 Arlyss Queen, RN BSN CM

## 2012-04-11 NOTE — Plan of Care (Signed)
Problem: Discharge Progression Outcomes Goal: Discharge plan in place and appropriate Pt d/c home with family, outpt pt setup and follow up appt

## 2012-04-11 NOTE — Progress Notes (Signed)
UR Chart Review Completed  

## 2012-04-11 NOTE — Progress Notes (Signed)
     Subjective: This lady presented yesterday with the symptoms of TIA which occurred twice. Unfortunately, because of her weight, MRI could not be done. She is awaiting carotid Dopplers to be done. She's had no further weakness or tingling on the left side as she had before.           Physical Exam: Blood pressure 142/64, pulse 74, temperature 97.8 F (36.6 C), temperature source Oral, resp. rate 18, height 5\' 3"  (1.6 m), weight 152.1 kg (335 lb 5.1 oz), SpO2 96.00%. She looks systemically well. There are no new findings today.   Investigations:     Basic Metabolic Panel:  Recent Labs  16/10/96 1459  NA 140  K 4.1  CL 98  CO2 33*  GLUCOSE 183*  BUN 19  CREATININE 0.73  CALCIUM 9.5      CBC:  Recent Labs  04/10/12 1459  WBC 13.9*  NEUTROABS 9.0*  HGB 12.7  HCT 39.2  MCV 91.8  PLT 309    Ct Head Wo Contrast  04/10/2012  *RADIOLOGY REPORT*  Clinical Data: Shortness of breath.  The left-sided body numbness, tingling, and weakness for 2 days with episodes of slurred speech.  CT HEAD WITHOUT CONTRAST  Technique:  Contiguous axial images were obtained from the base of the skull through the vertex without contrast.  Comparison: None.  Findings: No acute intracranial abnormality is present. Specifically, there is no evidence for acute infarct, hemorrhage, mass, hydrocephalus, or extra-axial fluid collection.  The paranasal sinuses and mastoid air cells are clear.  The globes and orbits are intact.  The osseous skull is intact.  IMPRESSION: Negative CT of the head.   Original Report Authenticated By: Marin Roberts, M.D.    Dg Chest Portable 1 View  04/10/2012  *RADIOLOGY REPORT*  Clinical Data: 47 year old female shortness of breath chest pain numbness.  PORTABLE CHEST - 1 VIEW  Comparison: 11/10/2011 and earlier.  Findings: AP portable upright view 1523 hours.  Stable lung volumes.  Stable cardiac size and mediastinal contours.  Visualized tracheal air column  is within normal limits.  No pneumothorax. Vascular congestion without overt edema.  No pleural effusion or consolidation.  IMPRESSION: Vascular congestion without overt edema.   Original Report Authenticated By: Erskine Speed, M.D.       Medications: I have reviewed the patient's current medications.  Impression: 1. TIA. 2. Hypertension, controlled. 3. Type 2 diabetes mellitus, not optimally controlled, hemoglobin A1c 6.9%. 4. Hyperlipidemia. 5. Morbid obesity. 6. COPD, stable .     Plan: 1. Await bilateral carotid Dopplers.     LOS: 1 day   [name] Pager [phone]  04/11/2012, 8:22 AM

## 2012-04-11 NOTE — Discharge Summary (Signed)
Physician Discharge Summary  Nicole [name] WGN:[id number] DOB: April 15, 1965 DOA: 04/10/2012  PCP: [name], PA-C  Admit date: 04/10/2012 Discharge date: 04/11/2012  Time spent: Greater than 30 minutes  Recommendations for Outpatient Follow-up:  1. Follow with primary care provider. 2. Consider bariatric surgery for morbid obesity.  3. Optimize control of all her risk factors.  Discharge Diagnoses:  1. TIA, right brain. No evidence of CVA on CT scan. Patient was unable to have MRI scan secondary to her weight. 2. Type 2 diabetes mellitus, suboptimal control. 3. Hypertension, controlled. 4. Hyperlipidemia, suboptimal control, hemoglobin A1c 6.9%. 5. Morbid obesity. 6. COPD, stable.   Discharge Condition: Stable.  Diet recommendation: Low glycemic index nutrition.  Filed Weights   04/10/12 1436 04/10/12 2040 04/11/12 0424  Weight: 156.491 kg (345 lb) 152.2 kg (335 lb 8.6 oz) 152.1 kg (335 lb 5.1 oz)    History of present illness:  This 47 year old lady presents to the hospital with symptoms of left-sided weakness and tingling. Please see initial history as outlined below: HPI: Nicole [name] is a 47 y.o. female who presents to the hospital with 2 episodes of left-sided weakness affecting the arm and the leg and associated tingling. She also had difficulty with speech on the second occasion, finding it difficult to find words. The first episode lasted 10 minutes and it was 2 days ago. The second episode lasted 20 minutes and it was yesterday. This morning she experienced left-sided chest pain which was sharp and therefore she came to the emergency room. She's never had a stroke in the past. She does have a history of coronary artery disease. She also has hypertension, diabetes, hyperlipidemia and is morbidly obese. She is a nonsmoker. She quit smoking 10 years ago.  Hospital Course:  Patient was admitted and she had workup for her TIA. CT scan of the brain did not show CVA.  Unfortunately, secondary to her weight, MRI brain scan could not be done. Carotid Dopplers did not show any significant internal carotid artery stenosis. She is already on Plavix. On fortunately she has severe reaction to aspirin so we cannot add that. Her hyperlipidemia can be better controlled and have given her prescription for increased dose of pravastatin. Her diabetes needs to be optimally controlled. I discussed at length with the patient regarding weight loss and how important this is. She may want to consider bariatric surgery. She was seen by neurology, Dr Gerilyn Pilgrim. He recommended optimizing risk factors as well as considering bariatric surgery.  Procedures:  None.   Consultations:  Neurology, Dr Gerilyn Pilgrim.  Discharge Exam: Filed Vitals:   04/11/12 0219 04/11/12 0424 04/11/12 1015 04/11/12 1434  BP: 123/38 142/64 146/53 149/81  Pulse: 72 74 75 69  Temp: 98 F (36.7 C) 97.8 F (36.6 C) 97.8 F (36.6 C) 98.1 F (36.7 C)  TempSrc: Oral Oral Oral Oral  Resp: 18 18 18 18   Height:      Weight:  152.1 kg (335 lb 5.1 oz)    SpO2: 97% 96% 97% 97%    General: She looks systemically well. Cardiovascular: Heart sounds are present without gallop rhythm or murmurs. Respiratory: Lung fields are clear. Neurologically she is alert and orientated without any focal neurological signs whatsoever.  Discharge Instructions  Discharge Orders   Future Orders Complete By Expires     Diet - low sodium heart healthy  As directed     Increase activity slowly  As directed         Medication  List    TAKE these medications       citalopram 20 MG tablet  Commonly known as:  CELEXA  Take 20 mg by mouth daily.     clopidogrel 75 MG tablet  Commonly known as:  PLAVIX  Take 75 mg by mouth daily.     Fluticasone-Salmeterol 250-50 MCG/DOSE Aepb  Commonly known as:  ADVAIR  Inhale 1 puff into the lungs every 12 (twelve) hours.     furosemide 40 MG tablet  Commonly known as:  LASIX  Take 1  tablet (40 mg total) by mouth 2 (two) times daily.     insulin aspart 100 UNIT/ML injection  Commonly known as:  novoLOG  Inject 50 Units into the skin 3 (three) times daily before meals.     insulin glargine 100 UNIT/ML injection  Commonly known as:  LANTUS  Inject 70-75 Units into the skin 2 (two) times daily. Take70 units in the morning and 75 units at bedtime     lisinopril 20 MG tablet  Commonly known as:  PRINIVIL,ZESTRIL  Take 20 mg by mouth 2 (two) times daily.     metoprolol 50 MG tablet  Commonly known as:  LOPRESSOR  Take 50 mg by mouth 2 (two) times daily.     pravastatin 40 MG tablet  Commonly known as:  PRAVACHOL  Take 2 tablets (80 mg total) by mouth at bedtime.     tiotropium 18 MCG inhalation capsule  Commonly known as:  SPIRIVA  Place 1 capsule (18 mcg total) into inhaler and inhale daily.     VENTOLIN HFA 108 (90 BASE) MCG/ACT inhaler  Generic drug:  albuterol  Inhale 2 puffs into the lungs as needed. Shortness of breath          The results of significant diagnostics from this hospitalization (including imaging, microbiology, ancillary and laboratory) are listed below for reference.    Significant Diagnostic Studies: Ct Head Wo Contrast  04/10/2012  *RADIOLOGY REPORT*  Clinical Data: Shortness of breath.  The left-sided body numbness, tingling, and weakness for 2 days with episodes of slurred speech.  CT HEAD WITHOUT CONTRAST  Technique:  Contiguous axial images were obtained from the base of the skull through the vertex without contrast.  Comparison: None.  Findings: No acute intracranial abnormality is present. Specifically, there is no evidence for acute infarct, hemorrhage, mass, hydrocephalus, or extra-axial fluid collection.  The paranasal sinuses and mastoid air cells are clear.  The globes and orbits are intact.  The osseous skull is intact.  IMPRESSION: Negative CT of the head.   Original Report Authenticated By: Marin Roberts, M.D.    US  Carotid Duplex Bilateral  04/11/2012  *RADIOLOGY REPORT*  Clinical Data: TIA  BILATERAL CAROTID DUPLEX ULTRASOUND  Technique: Wallace Cullens scale imaging, color Doppler and duplex ultrasound was performed of bilateral carotid and vertebral arteries in the neck.  Comparison:  None.  Criteria:  Quantification of carotid stenosis is based on velocity parameters that correlate the residual internal carotid diameter with NASCET-based stenosis levels, using the diameter of the distal internal carotid lumen as the denominator for stenosis measurement.  The following velocity measurements were obtained:                   PEAK SYSTOLIC/END DIASTOLIC RIGHT ICA:                        96cm/sec CCA:  105cm/sec SYSTOLIC ICA/CCA RATIO:     0.92 DIASTOLIC ICA/CCA RATIO: ECA:                        153cm/sec  LEFT ICA:                        69cm/sec CCA:                        106cm/sec SYSTOLIC ICA/CCA RATIO:     0.66 DIASTOLIC ICA/CCA RATIO: ECA:                        66cm/sec  Findings: The exam was limited due to obesity and patient breathing.  RIGHT CAROTID ARTERY: Mild focal calcified plaque in the external carotid.  Little if any plaque in the bulb.  Low resistance internal carotid Doppler pattern.  RIGHT VERTEBRAL ARTERY:  Not visualized.  LEFT CAROTID ARTERY: Minimal calcified plaque in the bulb.  Mild calcified plaque in the lower internal above the bulb.  Low resistance internal carotid Doppler pattern.  LEFT VERTEBRAL ARTERY:  Not visualized.  IMPRESSION: Mild plaque in the left internal carotid artery.  Less than 50% stenosis in the right and left internal carotid arteries. Vertebral arteries could not be visualized.  Overall, the exam was limited.  Further characterization with CTA or MRA of the neck can be performed.   Original Report Authenticated By: Jolaine Click, M.D.    Dg Chest Portable 1 View  04/10/2012  *RADIOLOGY REPORT*  Clinical Data: 47 year old female shortness of breath chest pain  numbness.  PORTABLE CHEST - 1 VIEW  Comparison: 11/10/2011 and earlier.  Findings: AP portable upright view 1523 hours.  Stable lung volumes.  Stable cardiac size and mediastinal contours.  Visualized tracheal air column is within normal limits.  No pneumothorax. Vascular congestion without overt edema.  No pleural effusion or consolidation.  IMPRESSION: Vascular congestion without overt edema.   Original Report Authenticated By: Erskine Speed, M.D.         Labs: Basic Metabolic Panel:  Recent Labs Lab 04/10/12 1459  NA 140  K 4.1  CL 98  CO2 33*  GLUCOSE 183*  BUN 19  CREATININE 0.73  CALCIUM 9.5       CBC:  Recent Labs Lab 04/10/12 1459  WBC 13.9*  NEUTROABS 9.0*  HGB 12.7  HCT 39.2  MCV 91.8  PLT 309   Cardiac Enzymes:  Recent Labs Lab 04/10/12 1459 04/10/12 2100 04/11/12 0253  TROPONINI <0.30 <0.30 <0.30   BNP: BNP (last 3 results)  Recent Labs  11/09/11 2140  PROBNP 188.7*   CBG:  Recent Labs Lab 04/10/12 1505 04/10/12 2219 04/11/12 0741 04/11/12 1128  GLUCAP 165* 209* 175* 143*       Signed:  Hendrix Yurkovich C  [facility] 04/11/2012, 2:53 PM

## 2012-04-11 NOTE — Consult Note (Addendum)
[facility] Kawon Willcutt A. Gerilyn Pilgrim, MD     [email or web address]          Nicole [name] is an 47 y.o. female.   ASSESSMENT/PLAN: The patient is likely had a subcortical infarct involving the right hemisphere. ( GIVEN THE PATIENT'S AGE,  MULTIPLE SCLEROSIS IS ALWAYS A POSSIBILITY BUT LESS LIKELY IN THIS SITUATION) Risk factors are morbid obesity, dyslipidemia, coronary disease and diabetes. There also may be untreated obstructive sleep apnea syndrome. The patient is on Plavix which would suggest be continued. She does not candidate to be on aspirin-containing medications given that she is allergic to aspirin. Her lipid could be better controlled. The syncopal episodes for her diabetes mellitus. Controlled these will help prevent recurrent ischemic events. I will also suggest that she be evaluated for obstructive sleep apnea syndrome which is a known risk factor for ischemic stroke. Given the morbid obesity, bariatric surgery is also suggested. The patient should have an MRI of the brain but this can be done on discharge at a facility in [place] which can accommodate the patient's weight. Additional blood for homocysteine, RPR and vitamin B12 will also be obtained.  The patient is a 47 year old right-handed white female who has multiple comorbidities at baseline. She developed the acute onset of numbness, tingling and heaviness of the left lower extremity about 3 days ago. The symptoms radiate proximally to involve the left side and also the left upper extremity. She reports that about 10 minutes after the onset of the event, she started to have improvement. The entire event lasted for about 20 minutes. She did report having difficulties with speech mostly actually as word finding difficulties. She had another event on yesterday but this time it appears that the event lasted for about 45-60 minutes. She is not completely at baseline. She complains of gait instability and difficulty walking around  since the second event. The patient did report having some chest pain on yesterday. She does have baseline coronary disease. She also did have headaches during the second event and throughout the day. She denies any loss of consciousness, shortness of breath, alteration of consciousness GI or GU symptoms.  GENERAL: This a very pleasant morbidly obese lady who is in no acute distress.  HEENT: Neck size is large and so his tongue size. Neck is supple. Head is normocephalic atraumatic.  ABDOMEN: soft  EXTREMITIES: No edema   BACK: Unremarkable.  SKIN: Normal by inspection.    MENTAL STATUS: Alert and oriented. Speech, language and cognition are generally intact. Judgment and insight normal.   CRANIAL NERVES: Pupils are equal, round and reactive to light and accomodation; extra ocular movements are full, there is no significant nystagmus; visual fields are full; upper and lower facial muscles are normal in strength and symmetric, there is no flattening of the nasolabial folds; tongue is midline; uvula is midline; shoulder elevation is normal.  MOTOR: Normal tone, bulk and strength; no pronator drift.  COORDINATION: Left finger to nose is normal, right finger to nose is normal, No rest tremor; no intention tremor; no postural tremor; no bradykinesia.  REFLEXES: Deep tendon reflexes are symmetrical and normal except at the patella bilaterally where they are diminished 1+ requiring augmentation. Plantar responses are flexor bilaterally.   SENSATION: Slightly diminished on the right lower extremity to temperature and light touch.  GAIT: This is unsteady and wide-based. She does ambulate by holding on however.    Past Medical History  Diagnosis Date  . Essential hypertension, benign   .  Type 2 diabetes mellitus   . Morbid obesity   . Dyslipidemia   . Coronary atherosclerosis of native coronary artery     Diagnosed [place] 2006 - DES RCA, reports followup cath 2009 at [facility]   . COPD  (chronic obstructive pulmonary disease)   . CAP (community acquired pneumonia)     Streptococcus 01/2011  . Pancreatitis     Past Surgical History  Procedure Laterality Date  . Abdominal hysterectomy    . Tonsillectomy    . Knee surgery    . Pilonidal cystectomy    . Nose surgery    . Coronary angioplasty with stent placement  2006  . Abdominal hernia repair      Family History  Problem Relation Age of Onset  . Diabetes Mother   . Heart failure Mother   . Hypertension Mother   . Kidney failure Mother     Social History:  reports that she quit smoking about 7 years ago. Her smoking use included Cigarettes. She smoked 0.00 packs per day for 20 years. She has never used smokeless tobacco. She reports that she does not drink alcohol or use illicit drugs.  Allergies:  Allergies  Allergen Reactions  . Aspirin Anaphylaxis  . Bee Venom Anaphylaxis  . Food Anaphylaxis    PINEAPPLE  . Influenza Vaccines Anaphylaxis  . E-Mycin (Erythromycin Base) Nausea And Vomiting    Medications: Prior to Admission medications   Medication Sig Start Date End Date Taking? Authorizing Provider  albuterol (VENTOLIN HFA) 108 (90 BASE) MCG/ACT inhaler Inhale 2 puffs into the lungs as needed. Shortness of breath   Yes Historical Provider, MD  citalopram (CELEXA) 20 MG tablet Take 20 mg by mouth daily.   Yes Historical Provider, MD  clopidogrel (PLAVIX) 75 MG tablet Take 75 mg by mouth daily.     Yes Historical Provider, MD  Fluticasone-Salmeterol (ADVAIR) 250-50 MCG/DOSE AEPB Inhale 1 puff into the lungs every 12 (twelve) hours.   Yes Historical Provider, MD  furosemide (LASIX) 40 MG tablet Take 1 tablet (40 mg total) by mouth 2 (two) times daily. 11/12/11  Yes Erick Blinks, MD  insulin aspart (NOVOLOG) 100 UNIT/ML injection Inject 50 Units into the skin 3 (three) times daily before meals.     Yes Historical Provider, MD  insulin glargine (LANTUS) 100 UNIT/ML injection Inject 70-75 Units into the  skin 2 (two) times daily. Take70 units in the morning and 75 units at bedtime   Yes Historical Provider, MD  lisinopril (PRINIVIL,ZESTRIL) 20 MG tablet Take 20 mg by mouth 2 (two) times daily.    Yes Historical Provider, MD  metoprolol (LOPRESSOR) 50 MG tablet Take 50 mg by mouth 2 (two) times daily.     Yes Historical Provider, MD  pravastatin (PRAVACHOL) 40 MG tablet Take 40 mg by mouth at bedtime.    Yes Historical Provider, MD  tiotropium (SPIRIVA) 18 MCG inhalation capsule Place 1 capsule (18 mcg total) into inhaler and inhale daily. 11/12/11  Yes Erick Blinks, MD    Scheduled Meds: . citalopram  20 mg Oral Daily  . clopidogrel  75 mg Oral Q breakfast  . furosemide  40 mg Oral BID  . heparin  5,000 Units Subcutaneous Q8H  . insulin aspart  0-20 Units Subcutaneous TID WC  . insulin aspart  0-5 Units Subcutaneous QHS  . insulin aspart  50 Units Subcutaneous TID AC  . insulin glargine  70 Units Subcutaneous Daily  . insulin glargine  75 Units Subcutaneous QHS  .  lisinopril  20 mg Oral BID  . metoprolol  50 mg Oral BID  . mometasone-formoterol  2 puff Inhalation BID  . pneumococcal 23 valent vaccine  0.5 mL Intramuscular Tomorrow-1000  . simvastatin  20 mg Oral q1800  . sodium chloride  3 mL Intravenous Q12H  . tiotropium  18 mcg Inhalation Daily   Continuous Infusions:  PRN Meds:.sodium chloride, acetaminophen, albuterol, sodium chloride   Blood pressure 142/64, pulse 74, temperature 97.8 F (36.6 C), temperature source Oral, resp. rate 18, height 5\' 3"  (1.6 m), weight 152.1 kg (335 lb 5.1 oz), SpO2 96.00%.   Results for orders placed during the hospital encounter of 04/10/12 (from the past 48 hour(s))  CBC WITH DIFFERENTIAL     Status: Abnormal   Collection Time    04/10/12  2:59 PM      Result Value Range   WBC 13.9 (*) 4.0 - 10.5 K/uL   RBC 4.27  3.87 - 5.11 MIL/uL   Hemoglobin 12.7  12.0 - 15.0 g/dL   HCT 09.8  11.9 - 14.7 %   MCV 91.8  78.0 - 100.0 fL   MCH 29.7   26.0 - 34.0 pg   MCHC 32.4  30.0 - 36.0 g/dL   RDW 82.9  56.2 - 13.0 %   Platelets 309  150 - 400 K/uL   Neutrophils Relative 65  43 - 77 %   Neutro Abs 9.0 (*) 1.7 - 7.7 K/uL   Lymphocytes Relative 25  12 - 46 %   Lymphs Abs 3.5  0.7 - 4.0 K/uL   Monocytes Relative 7  3 - 12 %   Monocytes Absolute 0.9  0.1 - 1.0 K/uL   Eosinophils Relative 3  0 - 5 %   Eosinophils Absolute 0.4  0.0 - 0.7 K/uL   Basophils Relative 1  0 - 1 %   Basophils Absolute 0.1  0.0 - 0.1 K/uL  BASIC METABOLIC PANEL     Status: Abnormal   Collection Time    04/10/12  2:59 PM      Result Value Range   Sodium 140  135 - 145 mEq/L   Potassium 4.1  3.5 - 5.1 mEq/L   Chloride 98  96 - 112 mEq/L   CO2 33 (*) 19 - 32 mEq/L   Glucose, Bld 183 (*) 70 - 99 mg/dL   BUN 19  6 - 23 mg/dL   Creatinine, Ser 8.65  0.50 - 1.10 mg/dL   Calcium 9.5  8.4 - 78.4 mg/dL   GFR calc non Af Amer >90  >90 mL/min   GFR calc Af Amer >90  >90 mL/min   Comment:            The eGFR has been calculated     using the CKD EPI equation.     This calculation has not been     validated in all clinical     situations.     eGFR's persistently     <90 mL/min signify     possible Chronic Kidney Disease.  TROPONIN I     Status: None   Collection Time    04/10/12  2:59 PM      Result Value Range   Troponin I <0.30  <0.30 ng/mL   Comment:            Due to the release kinetics of cTnI,     a negative result within the first hours     of the onset  of symptoms does not rule out     myocardial infarction with certainty.     If myocardial infarction is still suspected,     repeat the test at appropriate intervals.  D-DIMER, QUANTITATIVE     Status: None   Collection Time    04/10/12  2:59 PM      Result Value Range   D-Dimer, Quant 0.45  0.00 - 0.48 ug/mL-FEU   Comment:            AT THE INHOUSE ESTABLISHED CUTOFF     VALUE OF 0.48 ug/mL FEU,     THIS ASSAY HAS BEEN DOCUMENTED     IN THE LITERATURE TO HAVE     A SENSITIVITY AND  NEGATIVE     PREDICTIVE VALUE OF AT LEAST     98 TO 99%.  THE TEST RESULT     SHOULD BE CORRELATED WITH     AN ASSESSMENT OF THE CLINICAL     PROBABILITY OF DVT / VTE.  GLUCOSE, CAPILLARY     Status: Abnormal   Collection Time    04/10/12  3:05 PM      Result Value Range   Glucose-Capillary 165 (*) 70 - 99 mg/dL  HEMOGLOBIN A5W     Status: Abnormal   Collection Time    04/10/12  5:49 PM      Result Value Range   Hemoglobin A1C 6.9 (*) <5.7 %   Comment: (NOTE)                                                                               According to the ADA Clinical Practice Recommendations for 2011, when     HbA1c is used as a screening test:      >=6.5%   Diagnostic of Diabetes Mellitus               (if abnormal result is confirmed)     5.7-6.4%   Increased risk of developing Diabetes Mellitus     References:Diagnosis and Classification of Diabetes Mellitus,Diabetes     Care,2011,34(Suppl 1):S62-S69 and Standards of Medical Care in             Diabetes - 2011,Diabetes Care,2011,34 (Suppl 1):S11-S61.   Mean Plasma Glucose 151 (*) <117 mg/dL  TROPONIN I     Status: None   Collection Time    04/10/12  9:00 PM      Result Value Range   Troponin I <0.30  <0.30 ng/mL   Comment:            Due to the release kinetics of cTnI,     a negative result within the first hours     of the onset of symptoms does not rule out     myocardial infarction with certainty.     If myocardial infarction is still suspected,     repeat the test at appropriate intervals.  GLUCOSE, CAPILLARY     Status: Abnormal   Collection Time    04/10/12 10:19 PM      Result Value Range   Glucose-Capillary 209 (*) 70 - 99 mg/dL  LIPID PANEL     Status: Abnormal   Collection Time  04/11/12  2:53 AM      Result Value Range   Cholesterol 191  0 - 200 mg/dL   Triglycerides 161 (*) <150 mg/dL   HDL 28 (*) >09 mg/dL   Total CHOL/HDL Ratio 6.8     VLDL 51 (*) 0 - 40 mg/dL   LDL Cholesterol 604 (*) 0 - 99  mg/dL   Comment:            Total Cholesterol/HDL:CHD Risk     Coronary Heart Disease Risk Table                         Men   Women      1/2 Average Risk   3.4   3.3      Average Risk       5.0   4.4      2 X Average Risk   9.6   7.1      3 X Average Risk  23.4   11.0                Use the calculated Patient Ratio     above and the CHD Risk Table     to determine the patient's CHD Risk.                ATP III CLASSIFICATION (LDL):      <100     mg/dL   Optimal      540-981  mg/dL   Near or Above                        Optimal      130-159  mg/dL   Borderline      191-478  mg/dL   High      >295     mg/dL   Very High  TROPONIN I     Status: None   Collection Time    04/11/12  2:53 AM      Result Value Range   Troponin I <0.30  <0.30 ng/mL   Comment:            Due to the release kinetics of cTnI,     a negative result within the first hours     of the onset of symptoms does not rule out     myocardial infarction with certainty.     If myocardial infarction is still suspected,     repeat the test at appropriate intervals.  GLUCOSE, CAPILLARY     Status: Abnormal   Collection Time    04/11/12  7:41 AM      Result Value Range   Glucose-Capillary 175 (*) 70 - 99 mg/dL    Ct Head Wo Contrast  04/10/2012  *RADIOLOGY REPORT*  Clinical Data: Shortness of breath.  The left-sided body numbness, tingling, and weakness for 2 days with episodes of slurred speech.  CT HEAD WITHOUT CONTRAST  Technique:  Contiguous axial images were obtained from the base of the skull through the vertex without contrast.  Comparison: None.  Findings: No acute intracranial abnormality is present. Specifically, there is no evidence for acute infarct, hemorrhage, mass, hydrocephalus, or extra-axial fluid collection.  The paranasal sinuses and mastoid air cells are clear.  The globes and orbits are intact.  The osseous skull is intact.  IMPRESSION: Negative CT of the head.   Original Report Authenticated By:  Marin Roberts, M.D.    Dg Chest Portable  1 View  04/10/2012  *RADIOLOGY REPORT*  Clinical Data: 47 year old female shortness of breath chest pain numbness.  PORTABLE CHEST - 1 VIEW  Comparison: 11/10/2011 and earlier.  Findings: AP portable upright view 1523 hours.  Stable lung volumes.  Stable cardiac size and mediastinal contours.  Visualized tracheal air column is within normal limits.  No pneumothorax. Vascular congestion without overt edema.  No pleural effusion or consolidation.  IMPRESSION: Vascular congestion without overt edema.   Original Report Authenticated By: Erskine Speed, M.D.         Kailer Heindel A. Gerilyn Pilgrim, M.D.  Diplomate, Biomedical engineer of Psychiatry and Neurology ( Neurology). 04/11/2012, 8:44 AM

## 2012-04-11 NOTE — Progress Notes (Signed)
We received a consult for a PT eval just prior to pt discharge.  I interviewed pt and she states that she feels that her L side is slightly weaker than the R and would like an exercise program for strengthening.  Because I would not be able to follow up with her, I advised her that the best strategy would be to begin OP PT.  She is agreeable to this and her husband could bring her in on [place].  She will have insurance coverage after March 1, so would like for PT to begin after this time.  I have spoken with Cranston Neighbor, her RN and she will facilitate this.

## 2012-04-24 ENCOUNTER — Inpatient Hospital Stay (HOSPITAL_COMMUNITY): Admission: RE | Admit: 2012-04-24 | Payer: Self-pay | Source: Ambulatory Visit | Admitting: Physical Therapy

## 2012-04-24 ENCOUNTER — Telehealth (HOSPITAL_COMMUNITY): Payer: Self-pay | Admitting: Physical Therapy

## 2012-04-25 ENCOUNTER — Ambulatory Visit (HOSPITAL_COMMUNITY): Payer: BC Managed Care – PPO | Attending: Physical Therapy | Admitting: Physical Therapy

## 2012-05-25 ENCOUNTER — Emergency Department (HOSPITAL_COMMUNITY): Payer: BC Managed Care – PPO

## 2012-05-25 ENCOUNTER — Encounter (HOSPITAL_COMMUNITY): Payer: Self-pay | Admitting: *Deleted

## 2012-05-25 ENCOUNTER — Emergency Department (HOSPITAL_COMMUNITY)
Admission: EM | Admit: 2012-05-25 | Discharge: 2012-05-26 | Disposition: A | Discharge status: Home / Self Care | Payer: BC Managed Care – PPO | Attending: Emergency Medicine | Admitting: Emergency Medicine

## 2012-05-25 ENCOUNTER — Ambulatory Visit (INDEPENDENT_AMBULATORY_CARE_PROVIDER_SITE_OTHER)
Admission: RE | Admit: 2012-05-25 | Discharge: 2012-05-25 | Disposition: A | Discharge status: Home / Self Care | Payer: BC Managed Care – PPO | Source: Ambulatory Visit | Attending: Internal Medicine | Admitting: Internal Medicine

## 2012-05-25 ENCOUNTER — Encounter: Payer: Self-pay | Admitting: Internal Medicine

## 2012-05-25 ENCOUNTER — Ambulatory Visit (INDEPENDENT_AMBULATORY_CARE_PROVIDER_SITE_OTHER): Payer: BC Managed Care – PPO | Admitting: Internal Medicine

## 2012-05-25 VITALS — BP 130/68 | HR 70 | Temp 98.1°F | Ht 63.0 in | Wt 339.4 lb

## 2012-05-25 DIAGNOSIS — J4489 Other specified chronic obstructive pulmonary disease: Secondary | ICD-10-CM | POA: Insufficient documentation

## 2012-05-25 DIAGNOSIS — E785 Hyperlipidemia, unspecified: Secondary | ICD-10-CM | POA: Insufficient documentation

## 2012-05-25 DIAGNOSIS — Z79899 Other long term (current) drug therapy: Secondary | ICD-10-CM | POA: Insufficient documentation

## 2012-05-25 DIAGNOSIS — J449 Chronic obstructive pulmonary disease, unspecified: Secondary | ICD-10-CM

## 2012-05-25 DIAGNOSIS — E119 Type 2 diabetes mellitus without complications: Secondary | ICD-10-CM | POA: Insufficient documentation

## 2012-05-25 DIAGNOSIS — Z794 Long term (current) use of insulin: Secondary | ICD-10-CM | POA: Insufficient documentation

## 2012-05-25 DIAGNOSIS — Z8719 Personal history of other diseases of the digestive system: Secondary | ICD-10-CM | POA: Insufficient documentation

## 2012-05-25 DIAGNOSIS — Z7902 Long term (current) use of antithrombotics/antiplatelets: Secondary | ICD-10-CM | POA: Insufficient documentation

## 2012-05-25 DIAGNOSIS — R059 Cough, unspecified: Secondary | ICD-10-CM

## 2012-05-25 DIAGNOSIS — I10 Essential (primary) hypertension: Secondary | ICD-10-CM | POA: Insufficient documentation

## 2012-05-25 DIAGNOSIS — R51 Headache: Secondary | ICD-10-CM | POA: Insufficient documentation

## 2012-05-25 DIAGNOSIS — Z9861 Coronary angioplasty status: Secondary | ICD-10-CM | POA: Insufficient documentation

## 2012-05-25 DIAGNOSIS — R209 Unspecified disturbances of skin sensation: Secondary | ICD-10-CM | POA: Insufficient documentation

## 2012-05-25 DIAGNOSIS — I2510 Atherosclerotic heart disease of native coronary artery without angina pectoris: Secondary | ICD-10-CM | POA: Insufficient documentation

## 2012-05-25 DIAGNOSIS — R079 Chest pain, unspecified: Secondary | ICD-10-CM

## 2012-05-25 DIAGNOSIS — R05 Cough: Secondary | ICD-10-CM

## 2012-05-25 DIAGNOSIS — R0789 Other chest pain: Secondary | ICD-10-CM | POA: Insufficient documentation

## 2012-05-25 DIAGNOSIS — Z8701 Personal history of pneumonia (recurrent): Secondary | ICD-10-CM | POA: Insufficient documentation

## 2012-05-25 DIAGNOSIS — Z87891 Personal history of nicotine dependence: Secondary | ICD-10-CM | POA: Insufficient documentation

## 2012-05-25 LAB — CBC WITH DIFFERENTIAL/PLATELET
Eosinophils Absolute: 0.3 10*3/uL (ref 0.0–0.7)
HCT: 39.4 % (ref 36.0–46.0)
Hemoglobin: 12.8 g/dL (ref 12.0–15.0)
Lymphs Abs: 3.3 10*3/uL (ref 0.7–4.0)
MCH: 29.3 pg (ref 26.0–34.0)
MCHC: 32.5 g/dL (ref 30.0–36.0)
Monocytes Absolute: 0.7 10*3/uL (ref 0.1–1.0)
Monocytes Relative: 5 % (ref 3–12)
Neutrophils Relative %: 69 % (ref 43–77)
RBC: 4.37 MIL/uL (ref 3.87–5.11)

## 2012-05-25 MED ORDER — OLMESARTAN MEDOXOMIL 40 MG PO TABS: 40.0000 mg | ORAL_TABLET | Freq: Every day | ORAL | Status: DC

## 2012-05-25 MED ORDER — FAMOTIDINE 20 MG PO TABS: ORAL_TABLET | ORAL | Status: DC

## 2012-05-25 MED ORDER — ALBUTEROL SULFATE (2.5 MG/3ML) 0.083% IN NEBU: 2.5000 mg | INHALATION_SOLUTION | Freq: Four times a day (QID) | RESPIRATORY_TRACT | Status: DC | PRN

## 2012-05-25 MED ORDER — PANTOPRAZOLE SODIUM 40 MG PO TBEC: 40.0000 mg | DELAYED_RELEASE_TABLET | Freq: Every day | ORAL | Status: DC

## 2012-05-25 NOTE — Patient Instructions (Addendum)
Stop lisinopril and all inhalers and just nebulizer every 4 hours if needed  Start benicar 40 mg one daily ( one half daily if light headed standing)  Pantoprazole (protonix) 40 mg   Take 30-60 min before first meal of the day and Pepcid 20 mg one bedtime until return to office - this is the best way to tell whether stomach acid is contributing to your problem.    Please remember to go to the x-ray department downstairs for your tests - we will call you with the results when they are available.     Please schedule a follow up office visit in 4 weeks, sooner if needed PFTs on return

## 2012-05-25 NOTE — Progress Notes (Signed)
  Subjective:    Patient ID: Nicole [name], female    DOB: August 17, 1965,MRN: [id number]  HPI  47 yowf quit smoking 2006 required neb maybe twice a week at wt around 200-210 referred 05/25/2012  To pulmonary clinic by  [facility] for eval of refractory copd symptoms.  05/25/2012 1st pulmonary eval on ACEi with harsh cough assoc with hoarseness acutely worse since Dec 2013 assoc. Sleeps in reclined position 60 degrees. Doe x 50 ft at baseline. No better on multiple inhalers.  No obvious daytime variabilty or assoc chronic cough or cp or chest tightness, subjective wheeze overt sinus or hb symptoms. No unusual exp hx or h/o childhood pna/ asthma or premature birth to her knowledge.   Sleeping ok at 69 degrees without nocturnal  or early am exacerbation  of respiratory  c/o's or need for noct saba. Also denies any obvious fluctuation of symptoms with weather or environmental changes or other aggravating or alleviating factors except as outlined above   Review of Systems  Constitutional: Negative for fever, chills and unexpected weight change.  HENT: Negative for ear pain, nosebleeds, congestion, sore throat, rhinorrhea, sneezing, trouble swallowing, dental problem, voice change, postnasal drip and sinus pressure.   Eyes: Negative for visual disturbance.  Respiratory: Positive for shortness of breath. Negative for cough and choking.   Cardiovascular: Positive for chest pain and leg swelling.  Gastrointestinal: Negative for vomiting, abdominal pain and diarrhea.  Genitourinary: Negative for difficulty urinating.  Musculoskeletal: Negative for arthralgias.  Skin: Negative for rash.  Neurological: Positive for headaches. Negative for tremors and syncope.  Hematological: Does not bruise/bleed easily.       Objective:   Physical Exam  Wt Readings from Last 3 Encounters:  05/25/12 339 lb 6.4 oz (153.951 kg)  04/11/12 335 lb 5.1 oz (152.1 kg)  02/20/12 310 lb (140.615 kg)    Massively obese wf nad at  rest  HEENT mild turbinate edema.  Oropharynx no thrush or excess pnd or cobblestoning.  No JVD or cervical adenopathy. Mild accessory muscle hypertrophy. Trachea midline, nl thryroid. Chest was hyperinflated by percussion with diminished breath sounds and moderate increased exp time without wheeze. Hoover sign positive at mid inspiration. Regular rate and rhythm without murmur gallop or rub or increase P2 or edema.  Abd: no hsm, nl excursion. Ext warm without cyanosis or clubbing.      CXR  05/25/2012 :  There is mild scarring in the left lower lobe. There is no frank edema or consolidation. Heart is upper normal in size with normal pulmonary vascularity. No adenopathy. No bone lesions. There is degenerative change in the thoracic spine.  IMPRESSION: Scarring left lower lobe. No edema or consolidation.          Assessment & Plan:

## 2012-05-25 NOTE — ED Notes (Addendum)
Chest pain with numbness to lt arm and leg x 30 min.  Pt is tearful, onset when argument with daughter.  Has not taken plavix in 2 weeks.

## 2012-05-26 ENCOUNTER — Encounter: Payer: Self-pay | Admitting: Internal Medicine

## 2012-05-26 DIAGNOSIS — R05 Cough: Secondary | ICD-10-CM | POA: Insufficient documentation

## 2012-05-26 DIAGNOSIS — R059 Cough, unspecified: Secondary | ICD-10-CM | POA: Insufficient documentation

## 2012-05-26 LAB — COMPREHENSIVE METABOLIC PANEL
ALT: 9 U/L (ref 0–35)
AST: 13 U/L (ref 0–37)
Albumin: 3.2 g/dL — ABNORMAL LOW (ref 3.5–5.2)
CO2: 29 mEq/L (ref 19–32)
Calcium: 9.1 mg/dL (ref 8.4–10.5)
Creatinine, Ser: 0.62 mg/dL (ref 0.50–1.10)
GFR calc non Af Amer: >90 mL/min (ref >90)
Sodium: 136 mEq/L (ref 135–145)
Total Protein: 7.2 g/dL (ref 6.0–8.3)

## 2012-05-26 MED ORDER — HYDROCODONE-ACETAMINOPHEN 5-325 MG PO TABS
1.0000 | ORAL_TABLET | Freq: Once | ORAL | Status: AC
  Administered 2012-05-26: 1 via ORAL
  Filled 2012-05-26: qty 1

## 2012-05-26 MED ORDER — ONDANSETRON 4 MG PO TBDP
4.0000 mg | ORAL_TABLET | Freq: Once | ORAL | Status: AC
  Administered 2012-05-26: 4 mg via ORAL
  Filled 2012-05-26: qty 1

## 2012-05-26 NOTE — Assessment & Plan Note (Signed)
  When respiratory symptoms begin or become refractory well after a patient reports complete smoking cessation,  Especially when this wasn't the case while they were smoking, a red flag is raised based on the work of Dr Primitivo Gauze which states:  if you quit smoking when your best day FEV1 is still well preserved it is highly unlikely you will progress to severe disease.  That is to say, once the smoking stops,  the symptoms should not suddenly erupt or markedly worsen.  If so, the differential diagnosis should include  obesity/deconditioning,  LPR/Reflux/Aspiration syndromes,  occult CHF, or  especially side effect of medications commonly used in this population.    Obesity and gerd and acei are the likely problems here, not necessarily in that order, but I strongly doubt she has much copd  Needs pft;s on return and just use neb prn since other inhalers are likely to exacerbate her cough

## 2012-05-26 NOTE — Assessment & Plan Note (Addendum)
The most common causes of chronic cough in immunocompetent adults include the following: upper airway cough syndrome (UACS), previously referred to as postnasal drip syndrome (PNDS), which is caused by variety of rhinosinus conditions; (2) asthma; (3) GERD; (4) chronic bronchitis from cigarette smoking or other inhaled environmental irritants; (5) nonasthmatic eosinophilic bronchitis; and (6) bronchiectasis.   These conditions, singly or in combination, have accounted for up to 94% of the causes of chronic cough in prospective studies.   Other conditions have constituted no >6% of the causes in prospective studies These have included bronchogenic carcinoma, chronic interstitial pneumonia, sarcoidosis, left ventricular failure, ACEI-induced cough, and aspiration from a condition associated with pharyngeal dysfunction.    Chronic cough is often simultaneously caused by more than one condition. A single cause has been found from 38 to 82% of the time, multiple causes from 18 to 62%. Multiply caused cough has been the result of three diseases up to 42% of the time.       Almost certainly this is  Classic Upper airway cough syndrome, so named because it's frequently impossible to sort out how much is  CR/sinusitis with freq throat clearing (which can be related to primary GERD)   vs  causing  secondary (" extra esophageal")  GERD from wide swings in gastric pressure that occur with throat clearing, often  promoting self use of mint and menthol lozenges that reduce the lower esophageal sphincter tone and exacerbate the problem further in a cyclical fashion.   These are the same pts (now being labeled as having "irritable larynx syndrome" by some cough centers) who not infrequently have a history of having failed to tolerate ace inhibitors,  dry powder inhalers or biphosphonates or report having atypical reflux symptoms that don't respond to standard doses of PPI , and are easily confused as having aecopd or  asthma flares by even experienced allergists/ pulmonologists.   So try off acei and on max gerd rx and off all powdered inhalers then regroup

## 2012-05-26 NOTE — Progress Notes (Signed)
Quick Note:  Spoke with pt and notified of results per Dr. Wert. Pt verbalized understanding and denied any questions.  ______ 

## 2012-05-26 NOTE — ED Provider Notes (Signed)
History     CSN: [id number]  Arrival date & time 05/25/12  2157   First MD Initiated Contact with Patient 05/25/12 2346      Chief Complaint  Patient presents with  . Chest Pain    (Consider location/radiation/quality/duration/timing/severity/associated sxs/prior treatment) HPI Nicole [name] is a 47 y.o. female who presents to the Emergency Department complaining of left sided chest pain that began when she was fighting with her daughter earlier tonight. She had an argument with her younger daughter and had left sided chest pain with radiation to her left shoulder and down her left side. She called her older daughter to have her help and the sibling fighting began which the patient had to break up. She continued to have left sided pain. There was no accompanying shortness of breath, nausea, diaphoresis. The pain is currently "much better " , 2/10.   PCP Dr. Katherene Ponto Past Medical History  Diagnosis Date  . Essential hypertension, benign   . Type 2 diabetes mellitus   . Morbid obesity   . Dyslipidemia   . Coronary atherosclerosis of native coronary artery     Diagnosed [place] 2006 - DES RCA, reports followup cath 2009 at [facility]   . COPD (chronic obstructive pulmonary disease)   . CAP (community acquired pneumonia)     Streptococcus 01/2011  . Pancreatitis     Past Surgical History  Procedure Laterality Date  . Abdominal hysterectomy    . Tonsillectomy    . Knee surgery    . Pilonidal cystectomy    . Nose surgery    . Coronary angioplasty with stent placement  2006  . Abdominal hernia repair      Family History  Problem Relation Age of Onset  . Diabetes Mother   . Heart failure Mother   . Hypertension Mother   . Kidney failure Mother   . Asthma Son   . Ovarian cancer Mother     History  Substance Use Topics  . Smoking status: Former Smoker -- 1.50 packs/day for 20 years    Types: Cigarettes    Quit date: 08/11/2004  . Smokeless tobacco: Never Used  .  Alcohol Use: No    OB History   Grav Para Term Preterm Abortions TAB SAB Ect Mult Living                  Review of Systems  Constitutional: Negative for fever.       10 Systems reviewed and are negative for acute change except as noted in the HPI.  HENT: Negative for congestion.   Eyes: Negative for discharge and redness.  Respiratory: Negative for cough and shortness of breath.   Cardiovascular: Positive for chest pain.  Gastrointestinal: Negative for vomiting and abdominal pain.  Musculoskeletal: Negative for back pain.  Skin: Negative for rash.  Neurological: Negative for syncope, numbness and headaches.  Psychiatric/Behavioral:       No behavior change.    Allergies  Aspirin; Bee venom; Food; Influenza vaccines; and E-mycin  Home Medications   Current Outpatient Rx  Name  Route  Sig  Dispense  Refill  . albuterol (PROVENTIL) (2.5 MG/3ML) 0.083% nebulizer solution   Nebulization   Take 3 mLs (2.5 mg total) by nebulization every 6 (six) hours as needed for wheezing.   75 mL   12   . albuterol (VENTOLIN HFA) 108 (90 BASE) MCG/ACT inhaler   Inhalation   Inhale 2 puffs into the lungs as needed. Shortness of breath         .  citalopram (CELEXA) 20 MG tablet   Oral   Take 20 mg by mouth daily.         . clopidogrel (PLAVIX) 75 MG tablet   Oral   Take 75 mg by mouth daily.           . famotidine (PEPCID) 20 MG tablet      One at bedtime   30 tablet   2   . furosemide (LASIX) 40 MG tablet   Oral   Take 1 tablet (40 mg total) by mouth 2 (two) times daily.   60 tablet   1   . insulin aspart (NOVOLOG) 100 UNIT/ML injection   Subcutaneous   Inject 50 Units into the skin 3 (three) times daily before meals.           . insulin glargine (LANTUS) 100 UNIT/ML injection   Subcutaneous   Inject 70-75 Units into the skin 2 (two) times daily. Take70 units in the morning and 75 units at bedtime         . metoprolol (LOPRESSOR) 50 MG tablet   Oral   Take  50 mg by mouth 2 (two) times daily.           Marland Kitchen olmesartan (BENICAR) 40 MG tablet   Oral   Take 1 tablet (40 mg total) by mouth daily.         . pantoprazole (PROTONIX) 40 MG tablet   Oral   Take 1 tablet (40 mg total) by mouth daily. Take 30-60 min before first meal of the day   30 tablet   2   . pravastatin (PRAVACHOL) 40 MG tablet   Oral   Take 2 tablets (80 mg total) by mouth at bedtime.   30 tablet   0     BP 131/44  Pulse 73  Temp(Src) 98.6 F (37 C) (Oral)  Resp 20  Ht 5\' 3"  (1.6 m)  Wt 339 lb (153.769 kg)  BMI 60.07 kg/m2  SpO2 93%  Physical Exam  Nursing note and vitals reviewed. Constitutional: She appears well-developed and well-nourished.  Awake, alert, nontoxic appearance.  HENT:  Head: Normocephalic and atraumatic.  Right Ear: External ear normal.  Left Ear: External ear normal.  Mouth/Throat: Oropharynx is clear and moist.  Eyes: EOM are normal. Pupils are equal, round, and reactive to light.  Neck: Neck supple.  Cardiovascular: Normal rate and intact distal pulses.   Pulmonary/Chest: Effort normal and breath sounds normal. She exhibits no tenderness.  Abdominal: Soft. Bowel sounds are normal. There is no tenderness. There is no rebound.  Musculoskeletal: She exhibits no tenderness.  Baseline ROM, no obvious new focal weakness.  Neurological:  Mental status and motor strength appears baseline for patient and situation.  Skin: No rash noted.  Psychiatric: She has a normal mood and affect.    ED Course  Procedures (including critical care time) Results for orders placed during the hospital encounter of 05/25/12  COMPREHENSIVE METABOLIC PANEL      Result Value Range   Sodium 136  135 - 145 mEq/L   Potassium 3.8  3.5 - 5.1 mEq/L   Chloride 96  96 - 112 mEq/L   CO2 29  19 - 32 mEq/L   Glucose, Bld 277 (*) 70 - 99 mg/dL   BUN 15  6 - 23 mg/dL   Creatinine, Ser 4.09  0.50 - 1.10 mg/dL   Calcium 9.1  8.4 - 81.1 mg/dL   Total Protein 7.2  6.0  -  8.3 g/dL   Albumin 3.2 (*) 3.5 - 5.2 g/dL   AST 13  0 - 37 U/L   ALT 9  0 - 35 U/L   Alkaline Phosphatase 72  39 - 117 U/L   Total Bilirubin 0.2 (*) 0.3 - 1.2 mg/dL   GFR calc non Af Amer >90  >90 mL/min   GFR calc Af Amer >90  >90 mL/min  CBC WITH DIFFERENTIAL      Result Value Range   WBC 13.9 (*) 4.0 - 10.5 K/uL   RBC 4.37  3.87 - 5.11 MIL/uL   Hemoglobin 12.8  12.0 - 15.0 g/dL   HCT 16.1  09.6 - 04.5 %   MCV 90.2  78.0 - 100.0 fL   MCH 29.3  26.0 - 34.0 pg   MCHC 32.5  30.0 - 36.0 g/dL   RDW 40.9  81.1 - 91.4 %   Platelets 281  150 - 400 K/uL   Neutrophils Relative 69  43 - 77 %   Neutro Abs 9.6 (*) 1.7 - 7.7 K/uL   Lymphocytes Relative 24  12 - 46 %   Lymphs Abs 3.3  0.7 - 4.0 K/uL   Monocytes Relative 5  3 - 12 %   Monocytes Absolute 0.7  0.1 - 1.0 K/uL   Eosinophils Relative 2  0 - 5 %   Eosinophils Absolute 0.3  0.0 - 0.7 K/uL   Basophils Relative 0  0 - 1 %   Basophils Absolute 0.1  0.0 - 0.1 K/uL  TROPONIN I      Result Value Range   Troponin I <0.30  <0.30 ng/mL    Dg Chest 2 View  05/25/2012  *RADIOLOGY REPORT*  Clinical Data: Chest pain  CHEST - 2 VIEW  Comparison: 1600 hours  Findings: Mild cardiomegaly.  Normal vascularity. Subsegmental atelectasis or scar at the left base.  No  pleural effusion.  No pneumothorax.  IMPRESSION: Cardiomegaly without decompensation.   Original Report Authenticated By: Jolaine Click, M.D.    Dg Chest 2 View  05/25/2012  *RADIOLOGY REPORT*  Clinical Data: Shortness of breath; COPD  CHEST - 2 VIEW  Comparison: April 10, 2012  Findings: There is mild scarring in the left lower lobe.  There is no frank edema or consolidation.  Heart is upper normal in size with normal pulmonary vascularity.  No adenopathy.  No bone lesions. There is degenerative change in the thoracic spine.  IMPRESSION: Scarring left lower lobe.  No edema or consolidation.   Original Report Authenticated By: Bretta Bang, M.D.     Date: 05/25/2012   2213   Rate:80  Rhythm: normal sinus rhythm  QRS Axis: normal  Intervals: normal  ST/T Wave abnormalities: normal  Conduction Disutrbances:right bundle branch block and left anterior fascicular block  Narrative Interpretation:   Old EKG Reviewed: none available      MDM  Patient with chest pain and left sided numbness as a result of an argument with her daughter. Pain relieved while here without intervention. Chest xray x 2 normal. EKG unchanged for patient. Labs normal. Troponin negative. Patient given hydrocodone for headache. Reviewed results with patient and her husband. Pt stable in ED with no significant deterioration in condition.The patient appears reasonably screened and/or stabilized for discharge and I doubt any other medical condition or other [facility] requiring further screening, evaluation, or treatment in the ED at this time prior to discharge.  MDM Reviewed: nursing note and vitals Interpretation: labs, ECG and x-ray  Nicoletta Dress. Colon Branch, MD 05/26/12 [phone]

## 2012-06-30 ENCOUNTER — Ambulatory Visit: Payer: BC Managed Care – PPO | Admitting: Internal Medicine

## 2012-07-15 ENCOUNTER — Emergency Department (HOSPITAL_COMMUNITY)
Admission: EM | Admit: 2012-07-15 | Discharge: 2012-07-16 | Disposition: A | Discharge status: Home / Self Care | Payer: Medicaid Other | Attending: Emergency Medicine | Admitting: Emergency Medicine

## 2012-07-15 ENCOUNTER — Encounter (HOSPITAL_COMMUNITY): Payer: Self-pay | Admitting: *Deleted

## 2012-07-15 ENCOUNTER — Emergency Department (HOSPITAL_COMMUNITY): Payer: Medicaid Other

## 2012-07-15 DIAGNOSIS — I10 Essential (primary) hypertension: Secondary | ICD-10-CM | POA: Insufficient documentation

## 2012-07-15 DIAGNOSIS — J441 Chronic obstructive pulmonary disease with (acute) exacerbation: Secondary | ICD-10-CM | POA: Insufficient documentation

## 2012-07-15 DIAGNOSIS — Z7902 Long term (current) use of antithrombotics/antiplatelets: Secondary | ICD-10-CM | POA: Insufficient documentation

## 2012-07-15 DIAGNOSIS — I2510 Atherosclerotic heart disease of native coronary artery without angina pectoris: Secondary | ICD-10-CM | POA: Insufficient documentation

## 2012-07-15 DIAGNOSIS — E119 Type 2 diabetes mellitus without complications: Secondary | ICD-10-CM | POA: Insufficient documentation

## 2012-07-15 DIAGNOSIS — Z8719 Personal history of other diseases of the digestive system: Secondary | ICD-10-CM | POA: Insufficient documentation

## 2012-07-15 DIAGNOSIS — R05 Cough: Secondary | ICD-10-CM | POA: Insufficient documentation

## 2012-07-15 DIAGNOSIS — J40 Bronchitis, not specified as acute or chronic: Secondary | ICD-10-CM

## 2012-07-15 DIAGNOSIS — R059 Cough, unspecified: Secondary | ICD-10-CM | POA: Insufficient documentation

## 2012-07-15 DIAGNOSIS — J069 Acute upper respiratory infection, unspecified: Secondary | ICD-10-CM | POA: Insufficient documentation

## 2012-07-15 DIAGNOSIS — Z87891 Personal history of nicotine dependence: Secondary | ICD-10-CM | POA: Insufficient documentation

## 2012-07-15 DIAGNOSIS — Z79899 Other long term (current) drug therapy: Secondary | ICD-10-CM | POA: Insufficient documentation

## 2012-07-15 DIAGNOSIS — Z9861 Coronary angioplasty status: Secondary | ICD-10-CM | POA: Insufficient documentation

## 2012-07-15 DIAGNOSIS — Z8701 Personal history of pneumonia (recurrent): Secondary | ICD-10-CM | POA: Insufficient documentation

## 2012-07-15 DIAGNOSIS — Z794 Long term (current) use of insulin: Secondary | ICD-10-CM | POA: Insufficient documentation

## 2012-07-15 DIAGNOSIS — R609 Edema, unspecified: Secondary | ICD-10-CM | POA: Insufficient documentation

## 2012-07-15 DIAGNOSIS — E785 Hyperlipidemia, unspecified: Secondary | ICD-10-CM | POA: Insufficient documentation

## 2012-07-15 DIAGNOSIS — IMO0001 Reserved for inherently not codable concepts without codable children: Secondary | ICD-10-CM | POA: Insufficient documentation

## 2012-07-15 DIAGNOSIS — R011 Cardiac murmur, unspecified: Secondary | ICD-10-CM | POA: Insufficient documentation

## 2012-07-15 LAB — D-DIMER, QUANTITATIVE: D-Dimer, Quant: 0.35 ug/mL-FEU (ref 0.00–0.48)

## 2012-07-15 MED ORDER — HYDROCOD POLST-CHLORPHEN POLST 10-8 MG/5ML PO LQCR
5.0000 mL | Freq: Once | ORAL | Status: AC
  Administered 2012-07-15: 5 mL via ORAL
  Filled 2012-07-15: qty 5

## 2012-07-15 NOTE — ED Notes (Signed)
Pt reports "I've been coughing really bad for the past 2 weeks."  Reports she has used Mucinex and Robitussin with no relief.  SOB with exertion.

## 2012-07-16 ENCOUNTER — Other Ambulatory Visit: Payer: Self-pay | Admitting: Physician Assistant

## 2012-07-16 ENCOUNTER — Other Ambulatory Visit: Payer: Self-pay

## 2012-07-16 DIAGNOSIS — R05 Cough: Secondary | ICD-10-CM

## 2012-07-16 DIAGNOSIS — R059 Cough, unspecified: Secondary | ICD-10-CM

## 2012-07-16 LAB — PRO B NATRIURETIC PEPTIDE: Pro B Natriuretic peptide (BNP): 206 pg/mL — ABNORMAL HIGH (ref 0–125)

## 2012-07-16 LAB — BASIC METABOLIC PANEL
BUN: 16 mg/dL (ref 6–23)
CO2: 31 mEq/L (ref 19–32)
Chloride: 96 mEq/L (ref 96–112)
GFR calc Af Amer: >90 mL/min (ref >90)
Potassium: 3.9 mEq/L (ref 3.5–5.1)

## 2012-07-16 MED ORDER — PROMETHAZINE-CODEINE 6.25-10 MG/5ML PO SYRP: ORAL_SOLUTION | ORAL | Status: DC

## 2012-07-16 NOTE — ED Notes (Signed)
nad noted prior to dc. Dc instructions reviewed with pt and explained. 1 script given. Ambulated out without difficulty.

## 2012-07-16 NOTE — ED Provider Notes (Signed)
History     CSN: [id number]  Arrival date & time 07/15/12  2053   First MD Initiated Contact with Patient 07/15/12 2241      Chief Complaint  Patient presents with  . Cough  . Shortness of Breath    (Consider location/radiation/quality/duration/timing/severity/associated sxs/prior treatment) Patient is a 47 y.o. female presenting with cough and shortness of breath. The history is provided by the patient.  Cough Cough characteristics:  Non-productive Severity:  Moderate Onset quality:  Gradual Duration:  2 weeks Timing:  Intermittent Progression:  Worsening Chronicity:  New Context: exposure to allergens and weather changes   Relieved by:  Nothing Worsened by:  Nothing tried Ineffective treatments: otc cough meds. Associated symptoms: myalgias and shortness of breath   Associated symptoms: no chest pain, no eye discharge and no wheezing   Risk factors: no chemical exposure and no recent travel   Shortness of Breath Associated symptoms: cough   Associated symptoms: no abdominal pain, no chest pain, no neck pain and no wheezing     Past Medical History  Diagnosis Date  . Essential hypertension, benign   . Type 2 diabetes mellitus   . Morbid obesity   . Dyslipidemia   . Coronary atherosclerosis of native coronary artery     Diagnosed [place] 2006 - DES RCA, reports followup cath 2009 at [facility]   . COPD (chronic obstructive pulmonary disease)   . CAP (community acquired pneumonia)     Streptococcus 01/2011  . Pancreatitis     Past Surgical History  Procedure Laterality Date  . Abdominal hysterectomy    . Tonsillectomy    . Knee surgery    . Pilonidal cystectomy    . Nose surgery    . Coronary angioplasty with stent placement  2006  . Abdominal hernia repair      Family History  Problem Relation Age of Onset  . Diabetes Mother   . Heart failure Mother   . Hypertension Mother   . Kidney failure Mother   . Asthma Son   . Ovarian cancer Mother      History  Substance Use Topics  . Smoking status: Former Smoker -- 1.50 packs/day for 20 years    Types: Cigarettes    Quit date: 08/11/2004  . Smokeless tobacco: Never Used  . Alcohol Use: No    OB History   Grav Para Term Preterm Abortions TAB SAB Ect Mult Living                  Review of Systems  Constitutional: Negative for activity change.       All ROS Neg except as noted in HPI  HENT: Negative for nosebleeds and neck pain.   Eyes: Negative for photophobia and discharge.  Respiratory: Positive for cough and shortness of breath. Negative for wheezing.   Cardiovascular: Negative for chest pain and palpitations.  Gastrointestinal: Negative for abdominal pain and blood in stool.  Genitourinary: Negative for dysuria, frequency and hematuria.  Musculoskeletal: Positive for myalgias. Negative for back pain and arthralgias.  Skin: Negative.   Neurological: Negative for dizziness, seizures and speech difficulty.  Psychiatric/Behavioral: Negative for hallucinations and confusion.    Allergies  Aspirin; Bee venom; Food; Influenza vaccines; and E-mycin  Home Medications   Current Outpatient Rx  Name  Route  Sig  Dispense  Refill  . albuterol (PROVENTIL) (2.5 MG/3ML) 0.083% nebulizer solution   Nebulization   Take 3 mLs (2.5 mg total) by nebulization every 6 (six) hours as needed  for wheezing.   75 mL   12   . albuterol (VENTOLIN HFA) 108 (90 BASE) MCG/ACT inhaler   Inhalation   Inhale 2 puffs into the lungs as needed. Shortness of breath         . citalopram (CELEXA) 20 MG tablet   Oral   Take 20 mg by mouth daily.         . clopidogrel (PLAVIX) 75 MG tablet   Oral   Take 75 mg by mouth daily.           . famotidine (PEPCID) 20 MG tablet      One at bedtime   30 tablet   2   . furosemide (LASIX) 40 MG tablet   Oral   Take 1 tablet (40 mg total) by mouth 2 (two) times daily.   60 tablet   1   . insulin aspart (NOVOLOG) 100 UNIT/ML injection    Subcutaneous   Inject 50 Units into the skin 3 (three) times daily before meals.           . insulin glargine (LANTUS) 100 UNIT/ML injection   Subcutaneous   Inject 70-75 Units into the skin 2 (two) times daily. Take70 units in the morning and 75 units at bedtime         . metoprolol (LOPRESSOR) 50 MG tablet   Oral   Take 50 mg by mouth 2 (two) times daily.           Marland Kitchen olmesartan (BENICAR) 40 MG tablet   Oral   Take 1 tablet (40 mg total) by mouth daily.         . pantoprazole (PROTONIX) 40 MG tablet   Oral   Take 1 tablet (40 mg total) by mouth daily. Take 30-60 min before first meal of the day   30 tablet   2   . pravastatin (PRAVACHOL) 40 MG tablet   Oral   Take 2 tablets (80 mg total) by mouth at bedtime.   30 tablet   0     BP 152/52  Pulse 81  Temp(Src) 98.9 F (37.2 C) (Oral)  Resp 20  Ht 5\' 3"  (1.6 m)  Wt 340 lb (154.223 kg)  BMI 60.24 kg/m2  SpO2 93%  Physical Exam  Nursing note and vitals reviewed. Constitutional: She is oriented to person, place, and time. She appears well-developed and well-nourished.  Non-toxic appearance.  HENT:  Head: Normocephalic.  Right Ear: Tympanic membrane and external ear normal.  Left Ear: Tympanic membrane and external ear normal.  Eyes: EOM and lids are normal. Pupils are equal, round, and reactive to light.  Neck: Normal range of motion. Neck supple. Carotid bruit is not present.  Cardiovascular: Normal rate, regular rhythm, intact distal pulses and normal pulses.   Murmur heard. Pulmonary/Chest: Breath sounds normal. No respiratory distress.  Course breath sounds. No wheeze present. Symmetrical rise and fall of the chest.  Abdominal: Soft. Bowel sounds are normal. There is no tenderness. There is no guarding.  Musculoskeletal: Normal range of motion. She exhibits edema.  Lymphadenopathy:       Head (right side): No submandibular adenopathy present.       Head (left side): No submandibular adenopathy present.     She has no cervical adenopathy.  Neurological: She is alert and oriented to person, place, and time. She has normal strength. No cranial nerve deficit or sensory deficit.  Skin: Skin is warm and dry.  Psychiatric: She has a normal mood  and affect. Her speech is normal.    ED Course  Procedures (including critical care time)  Labs Reviewed  PRO B NATRIURETIC PEPTIDE - Abnormal; Notable for the following:    Pro B Natriuretic peptide (BNP) 206.0 (*)    All other components within normal limits  BASIC METABOLIC PANEL - Abnormal; Notable for the following:    Glucose, Bld 239 (*)    All other components within normal limits  D-DIMER, QUANTITATIVE   Dg Chest 2 View  07/15/2012   *RADIOLOGY REPORT*  Clinical Data: Cough, shortness of breath  CHEST - 2 VIEW  Comparison: None  Findings: Mild enlargement cardiomediastinal silhouette noted. Diffusely increased interstitial lung markings noted, without focal pulmonary opacity. The appearance is stable.  No focal pulmonary opacity.  No pleural effusion.  No acute osseous finding.  IMPRESSION: No significant interval change or acute finding.   Original Report Authenticated By: Christiana Pellant, M.D.     No diagnosis found.    MDM  I have reviewed nursing notes, vital signs, and all appropriate lab and imaging results for this patient. Patient presents to the emergency department with complaint of cough and shortness of breath. The patient states that approximately 1-2 weeks ago she was diagnosed with" allergies". She states she's been coughing every since. She has tried over-the-counter cough medications without success. She again saw her primary physician and was told to continue with the same regimen of medications. Patient states that she is having pain in her ribs and she is having difficulty resting at night because of this discomfort and with his cough.  Chest x-ray reveals no significant or acute findings. There is some diffusely increased  interstitial lung markings noted but these have been present on previous examinations. Electrocardiogram shows a normal sinus rhythm with no acute change. The B. natruretic peptide was slightly elevated at 206. Review of the patient's previous examination reveals that she has previously been slightly elevated at those times as well. The basic metabolic panel is within normal limits with exception of the glucose being elevated at 239. The d-dimer is negative.  Patient was given Tussionex in the emergency department with significant improvement in the cough and some improvement in the discomfort of the ribs. The patient speaking in complete sentences. It is felt that it is safe for her to return home. The patient will be given a prescription for promethazine codeine cough medication. She will continue her current medications. Patient to follow up with her primary physician next week for additional evaluation and management.       Kathie Dike, PA-C 07/16/12 0107

## 2012-07-16 NOTE — ED Provider Notes (Signed)
Medical screening examination/treatment/procedure(s) were conducted as a shared visit with non-physician practitioner(s) and myself.  I personally evaluated the patient during the encounter   Joya Gaskins, MD 07/16/12 [phone]

## 2012-07-16 NOTE — ED Provider Notes (Signed)
Patient seen/examined in the Emergency Department in conjunction with Midlevel Provider Beverely Pace Patient reports cough Exam : awake/alert, but is actively coughing during exam Plan: stable for d/c    Date: 07/16/2012 0058am  Rate: 78  Rhythm: normal sinus rhythm  QRS Axis: left  Intervals: normal  ST/T Wave abnormalities: nonspecific ST changes  Conduction Disutrbances:right bundle branch block  Narrative Interpretation:   Old EKG Reviewed: unchanged from 05/25/12     Joya Gaskins, MD 07/16/12 [phone]

## 2012-11-06 ENCOUNTER — Encounter (HOSPITAL_BASED_OUTPATIENT_CLINIC_OR_DEPARTMENT_OTHER): Payer: BC Managed Care – PPO

## 2019-02-27 ENCOUNTER — Ambulatory Visit: Payer: Medicare (Managed Care) | Attending: Internal Medicine

## 2019-02-27 ENCOUNTER — Other Ambulatory Visit: Payer: Self-pay

## 2019-02-27 DIAGNOSIS — Z20822 Contact with and (suspected) exposure to covid-19: Secondary | ICD-10-CM

## 2019-02-28 LAB — NOVEL CORONAVIRUS, NAA: SARS-CoV-2, NAA: NOT DETECTED

## 2019-03-29 DIAGNOSIS — I509 Heart failure, unspecified: Secondary | ICD-10-CM | POA: Insufficient documentation

## 2019-12-05 ENCOUNTER — Emergency Department (HOSPITAL_COMMUNITY): Payer: Medicare (Managed Care)

## 2019-12-05 ENCOUNTER — Other Ambulatory Visit: Payer: Self-pay

## 2019-12-05 ENCOUNTER — Inpatient Hospital Stay (HOSPITAL_COMMUNITY)
Admission: EM | Admit: 2019-12-05 | Discharge: 2019-12-10 | DRG: 638 | Disposition: A | Discharge status: Home Health | Payer: Medicare (Managed Care) | Attending: Internal Medicine | Admitting: Internal Medicine

## 2019-12-05 ENCOUNTER — Encounter (HOSPITAL_COMMUNITY): Payer: Self-pay

## 2019-12-05 DIAGNOSIS — Z955 Presence of coronary angioplasty implant and graft: Secondary | ICD-10-CM

## 2019-12-05 DIAGNOSIS — Z7902 Long term (current) use of antithrombotics/antiplatelets: Secondary | ICD-10-CM

## 2019-12-05 DIAGNOSIS — E11621 Type 2 diabetes mellitus with foot ulcer: Secondary | ICD-10-CM | POA: Diagnosis present

## 2019-12-05 DIAGNOSIS — I2510 Atherosclerotic heart disease of native coronary artery without angina pectoris: Secondary | ICD-10-CM | POA: Diagnosis present

## 2019-12-05 DIAGNOSIS — E11628 Type 2 diabetes mellitus with other skin complications: Secondary | ICD-10-CM | POA: Diagnosis present

## 2019-12-05 DIAGNOSIS — Z833 Family history of diabetes mellitus: Secondary | ICD-10-CM | POA: Diagnosis not present

## 2019-12-05 DIAGNOSIS — Z79899 Other long term (current) drug therapy: Secondary | ICD-10-CM

## 2019-12-05 DIAGNOSIS — Z8673 Personal history of transient ischemic attack (TIA), and cerebral infarction without residual deficits: Secondary | ICD-10-CM | POA: Diagnosis not present

## 2019-12-05 DIAGNOSIS — R197 Diarrhea, unspecified: Secondary | ICD-10-CM | POA: Diagnosis not present

## 2019-12-05 DIAGNOSIS — I4891 Unspecified atrial fibrillation: Secondary | ICD-10-CM | POA: Diagnosis present

## 2019-12-05 DIAGNOSIS — L97529 Non-pressure chronic ulcer of other part of left foot with unspecified severity: Secondary | ICD-10-CM | POA: Diagnosis present

## 2019-12-05 DIAGNOSIS — E1169 Type 2 diabetes mellitus with other specified complication: Secondary | ICD-10-CM | POA: Diagnosis present

## 2019-12-05 DIAGNOSIS — Z6841 Body Mass Index (BMI) 40.0 and over, adult: Secondary | ICD-10-CM

## 2019-12-05 DIAGNOSIS — R7989 Other specified abnormal findings of blood chemistry: Secondary | ICD-10-CM | POA: Diagnosis present

## 2019-12-05 DIAGNOSIS — E669 Obesity, unspecified: Secondary | ICD-10-CM | POA: Diagnosis not present

## 2019-12-05 DIAGNOSIS — I5032 Chronic diastolic (congestive) heart failure: Secondary | ICD-10-CM | POA: Diagnosis present

## 2019-12-05 DIAGNOSIS — Z8041 Family history of malignant neoplasm of ovary: Secondary | ICD-10-CM

## 2019-12-05 DIAGNOSIS — Z8249 Family history of ischemic heart disease and other diseases of the circulatory system: Secondary | ICD-10-CM

## 2019-12-05 DIAGNOSIS — Z91030 Bee allergy status: Secondary | ICD-10-CM

## 2019-12-05 DIAGNOSIS — Z20822 Contact with and (suspected) exposure to covid-19: Secondary | ICD-10-CM | POA: Diagnosis present

## 2019-12-05 DIAGNOSIS — I10 Essential (primary) hypertension: Secondary | ICD-10-CM | POA: Diagnosis present

## 2019-12-05 DIAGNOSIS — Z7901 Long term (current) use of anticoagulants: Secondary | ICD-10-CM

## 2019-12-05 DIAGNOSIS — Z794 Long term (current) use of insulin: Secondary | ICD-10-CM

## 2019-12-05 DIAGNOSIS — Z91018 Allergy to other foods: Secondary | ICD-10-CM

## 2019-12-05 DIAGNOSIS — S91309A Unspecified open wound, unspecified foot, initial encounter: Secondary | ICD-10-CM

## 2019-12-05 DIAGNOSIS — Z841 Family history of disorders of kidney and ureter: Secondary | ICD-10-CM

## 2019-12-05 DIAGNOSIS — E785 Hyperlipidemia, unspecified: Secondary | ICD-10-CM | POA: Diagnosis present

## 2019-12-05 DIAGNOSIS — I25119 Atherosclerotic heart disease of native coronary artery with unspecified angina pectoris: Secondary | ICD-10-CM | POA: Diagnosis present

## 2019-12-05 DIAGNOSIS — J449 Chronic obstructive pulmonary disease, unspecified: Secondary | ICD-10-CM | POA: Diagnosis present

## 2019-12-05 DIAGNOSIS — E1165 Type 2 diabetes mellitus with hyperglycemia: Secondary | ICD-10-CM | POA: Diagnosis present

## 2019-12-05 DIAGNOSIS — L089 Local infection of the skin and subcutaneous tissue, unspecified: Secondary | ICD-10-CM

## 2019-12-05 DIAGNOSIS — F17210 Nicotine dependence, cigarettes, uncomplicated: Secondary | ICD-10-CM | POA: Diagnosis present

## 2019-12-05 DIAGNOSIS — L03116 Cellulitis of left lower limb: Secondary | ICD-10-CM | POA: Diagnosis present

## 2019-12-05 DIAGNOSIS — Z883 Allergy status to other anti-infective agents status: Secondary | ICD-10-CM

## 2019-12-05 DIAGNOSIS — Z886 Allergy status to analgesic agent status: Secondary | ICD-10-CM

## 2019-12-05 DIAGNOSIS — E08621 Diabetes mellitus due to underlying condition with foot ulcer: Secondary | ICD-10-CM | POA: Diagnosis not present

## 2019-12-05 DIAGNOSIS — Z825 Family history of asthma and other chronic lower respiratory diseases: Secondary | ICD-10-CM

## 2019-12-05 DIAGNOSIS — G459 Transient cerebral ischemic attack, unspecified: Secondary | ICD-10-CM | POA: Diagnosis present

## 2019-12-05 DIAGNOSIS — I110 Hypertensive heart disease with heart failure: Secondary | ICD-10-CM | POA: Diagnosis present

## 2019-12-05 DIAGNOSIS — G4733 Obstructive sleep apnea (adult) (pediatric): Secondary | ICD-10-CM | POA: Diagnosis present

## 2019-12-05 DIAGNOSIS — L97422 Non-pressure chronic ulcer of left heel and midfoot with fat layer exposed: Secondary | ICD-10-CM | POA: Diagnosis not present

## 2019-12-05 DIAGNOSIS — Z89422 Acquired absence of other left toe(s): Secondary | ICD-10-CM

## 2019-12-05 DIAGNOSIS — Z90710 Acquired absence of both cervix and uterus: Secondary | ICD-10-CM | POA: Diagnosis not present

## 2019-12-05 LAB — CBG MONITORING, ED: Glucose-Capillary: 244 mg/dL — ABNORMAL HIGH (ref 70–99)

## 2019-12-05 LAB — COMPREHENSIVE METABOLIC PANEL
ALT: 18 U/L (ref 0–44)
AST: 22 U/L (ref 15–41)
Albumin: 3.3 g/dL — ABNORMAL LOW (ref 3.5–5.0)
Alkaline Phosphatase: 87 U/L (ref 38–126)
Anion gap: 11 (ref 5–15)
BUN: 16 mg/dL (ref 6–20)
CO2: 32 mmol/L (ref 22–32)
Calcium: 8.8 mg/dL — ABNORMAL LOW (ref 8.9–10.3)
Chloride: 94 mmol/L — ABNORMAL LOW (ref 98–111)
Creatinine, Ser: 0.89 mg/dL (ref 0.44–1.00)
GFR, Estimated: >60 mL/min (ref >60)
Glucose, Bld: 324 mg/dL — ABNORMAL HIGH (ref 70–99)
Potassium: 3.7 mmol/L (ref 3.5–5.1)
Sodium: 137 mmol/L (ref 135–145)
Total Bilirubin: 0.5 mg/dL (ref 0.3–1.2)
Total Protein: 7.1 g/dL (ref 6.5–8.1)

## 2019-12-05 LAB — C-REACTIVE PROTEIN: CRP: 8.7 mg/dL — ABNORMAL HIGH (ref <1.0)

## 2019-12-05 LAB — CBC WITH DIFFERENTIAL/PLATELET
Abs Immature Granulocytes: 0.08 10*3/uL — ABNORMAL HIGH (ref 0.00–0.07)
Basophils Absolute: 0.1 10*3/uL (ref 0.0–0.1)
Basophils Relative: 1 %
Eosinophils Absolute: 0.4 10*3/uL (ref 0.0–0.5)
Eosinophils Relative: 3 %
HCT: 39.3 % (ref 36.0–46.0)
Hemoglobin: 12.7 g/dL (ref 12.0–15.0)
Immature Granulocytes: 1 %
Lymphocytes Relative: 25 %
Lymphs Abs: 3.6 10*3/uL (ref 0.7–4.0)
MCH: 30.9 pg (ref 26.0–34.0)
MCHC: 32.3 g/dL (ref 30.0–36.0)
MCV: 95.6 fL (ref 80.0–100.0)
Monocytes Absolute: 1 10*3/uL (ref 0.1–1.0)
Monocytes Relative: 7 %
Neutro Abs: 9.6 10*3/uL — ABNORMAL HIGH (ref 1.7–7.7)
Neutrophils Relative %: 63 %
Platelets: 244 10*3/uL (ref 150–400)
RBC: 4.11 MIL/uL (ref 3.87–5.11)
RDW: 14.2 % (ref 11.5–15.5)
WBC: 14.7 10*3/uL — ABNORMAL HIGH (ref 4.0–10.5)
nRBC: 0 % (ref 0.0–0.2)

## 2019-12-05 LAB — SEDIMENTATION RATE: Sed Rate: 67 mm/hr — ABNORMAL HIGH (ref 0–22)

## 2019-12-05 LAB — CULTURE, BLOOD (ROUTINE X 2): Special Requests: ADEQUATE

## 2019-12-05 LAB — RESPIRATORY PANEL BY RT PCR (FLU A&B, COVID)
Influenza A by PCR: NEGATIVE
Influenza B by PCR: NEGATIVE
SARS Coronavirus 2 by RT PCR: NEGATIVE

## 2019-12-05 LAB — LACTIC ACID, PLASMA
Lactic Acid, Venous: 2.8 mmol/L (ref 0.5–1.9)
Lactic Acid, Venous: 2.7 mmol/L (ref 0.5–1.9)

## 2019-12-05 MED ORDER — ACETAMINOPHEN 650 MG RE SUPP: 650.0000 mg | Freq: Four times a day (QID) | RECTAL | Status: DC | PRN

## 2019-12-05 MED ORDER — ALBUTEROL SULFATE (2.5 MG/3ML) 0.083% IN NEBU: 2.5000 mg | INHALATION_SOLUTION | Freq: Four times a day (QID) | RESPIRATORY_TRACT | Status: DC | PRN

## 2019-12-05 MED ORDER — NICOTINE 21 MG/24HR TD PT24
21.0000 mg | MEDICATED_PATCH | Freq: Every day | TRANSDERMAL | Status: DC
  Administered 2019-12-05 – 2019-12-10 (×6): 21 mg via TRANSDERMAL
  Filled 2019-12-05 (×6): qty 1

## 2019-12-05 MED ORDER — METRONIDAZOLE IN NACL 5-0.79 MG/ML-% IV SOLN
500.0000 mg | Freq: Three times a day (TID) | INTRAVENOUS | Status: DC
  Administered 2019-12-05 – 2019-12-08 (×9): 500 mg via INTRAVENOUS
  Filled 2019-12-05 (×10): qty 100

## 2019-12-05 MED ORDER — LISINOPRIL 5 MG PO TABS
2.5000 mg | ORAL_TABLET | Freq: Every day | ORAL | Status: DC
  Administered 2019-12-06 – 2019-12-10 (×5): 2.5 mg via ORAL
  Filled 2019-12-05 (×5): qty 1

## 2019-12-05 MED ORDER — INSULIN ASPART 100 UNIT/ML ~~LOC~~ SOLN
0.0000 [IU] | Freq: Three times a day (TID) | SUBCUTANEOUS | Status: DC
  Administered 2019-12-06: 11 [IU] via SUBCUTANEOUS
  Administered 2019-12-06: 7 [IU] via SUBCUTANEOUS
  Administered 2019-12-06: 15 [IU] via SUBCUTANEOUS
  Administered 2019-12-07 – 2019-12-08 (×6): 11 [IU] via SUBCUTANEOUS
  Administered 2019-12-09 (×2): 7 [IU] via SUBCUTANEOUS
  Filled 2019-12-05 (×2): qty 1

## 2019-12-05 MED ORDER — INSULIN ASPART 100 UNIT/ML ~~LOC~~ SOLN
0.0000 [IU] | Freq: Every day | SUBCUTANEOUS | Status: DC
  Administered 2019-12-05: 2 [IU] via SUBCUTANEOUS
  Administered 2019-12-06: 3 [IU] via SUBCUTANEOUS
  Administered 2019-12-07: 4 [IU] via SUBCUTANEOUS
  Administered 2019-12-08: 2 [IU] via SUBCUTANEOUS
  Filled 2019-12-05: qty 1

## 2019-12-05 MED ORDER — INSULIN DETEMIR 100 UNIT/ML ~~LOC~~ SOLN
75.0000 [IU] | Freq: Two times a day (BID) | SUBCUTANEOUS | Status: DC
  Administered 2019-12-05 – 2019-12-07 (×4): 75 [IU] via SUBCUTANEOUS
  Filled 2019-12-05 (×11): qty 0.75

## 2019-12-05 MED ORDER — SODIUM CHLORIDE 0.9 % IV BOLUS
1000.0000 mL | Freq: Once | INTRAVENOUS | Status: AC
  Administered 2019-12-05: 1000 mL via INTRAVENOUS

## 2019-12-05 MED ORDER — PANTOPRAZOLE SODIUM 40 MG PO TBEC
40.0000 mg | DELAYED_RELEASE_TABLET | Freq: Every day | ORAL | Status: DC
  Administered 2019-12-06 – 2019-12-10 (×5): 40 mg via ORAL
  Filled 2019-12-05 (×5): qty 1

## 2019-12-05 MED ORDER — PIPERACILLIN-TAZOBACTAM 3.375 G IVPB 30 MIN
3.3750 g | Freq: Once | INTRAVENOUS | Status: AC
  Administered 2019-12-05: 3.375 g via INTRAVENOUS
  Filled 2019-12-05: qty 50

## 2019-12-05 MED ORDER — APIXABAN 5 MG PO TABS
5.0000 mg | ORAL_TABLET | Freq: Two times a day (BID) | ORAL | Status: DC
  Administered 2019-12-05 – 2019-12-10 (×10): 5 mg via ORAL
  Filled 2019-12-05 (×10): qty 1

## 2019-12-05 MED ORDER — SODIUM CHLORIDE 0.9 % IV SOLN
2.0000 g | INTRAVENOUS | Status: DC
  Administered 2019-12-05 – 2019-12-07 (×3): 2 g via INTRAVENOUS
  Filled 2019-12-05 (×3): qty 20

## 2019-12-05 MED ORDER — ACETAMINOPHEN 325 MG PO TABS
650.0000 mg | ORAL_TABLET | Freq: Four times a day (QID) | ORAL | Status: DC | PRN
  Administered 2019-12-05 – 2019-12-08 (×3): 650 mg via ORAL
  Filled 2019-12-05 (×3): qty 2

## 2019-12-05 MED ORDER — SIMVASTATIN 20 MG PO TABS
40.0000 mg | ORAL_TABLET | Freq: Every day | ORAL | Status: DC
  Administered 2019-12-06 – 2019-12-09 (×4): 40 mg via ORAL
  Filled 2019-12-05 (×5): qty 2

## 2019-12-05 MED ORDER — CLOPIDOGREL BISULFATE 75 MG PO TABS
75.0000 mg | ORAL_TABLET | Freq: Every day | ORAL | Status: DC
  Administered 2019-12-06 – 2019-12-10 (×5): 75 mg via ORAL
  Filled 2019-12-05 (×5): qty 1

## 2019-12-05 MED ORDER — METOPROLOL TARTRATE 50 MG PO TABS
50.0000 mg | ORAL_TABLET | Freq: Two times a day (BID) | ORAL | Status: DC
  Administered 2019-12-05 – 2019-12-10 (×10): 50 mg via ORAL
  Filled 2019-12-05 (×10): qty 1

## 2019-12-05 NOTE — ED Notes (Signed)
Date and time results received: 12/05/19 7:41 PM  (use smartphrase ".now" to insert current time)  Test: Lactic Critical Value: 2.8  Name of Provider Notified: PA Placido Sou  Orders Received? Or Actions Taken?:N/A

## 2019-12-05 NOTE — H&P (Addendum)
TRH H&P   Patient Demographics:    Nicole [name], is a 54 y.o. female  MRN: [id number]   DOB - 07/09/1965  Admit Date - 12/05/2019  Outpatient Primary MD for the patient is Pcp, No  Referring MD/NP/PA: PA Joldersma  Patient coming from: Home  Chief Complaint  Patient presents with  . Wound Check      HPI:    Nicole PrestoLisa Hauschild  is a 54 y.o. female, with past medical history of COPD, hyperlipidemia, hypertension, morbid obesity, type 2 diabetes mellitus insulin-dependent, CAD s/p PCI, chronic diastolic CHF, A. fib on Eliquis, OSA on CPAP, patient presents to ED for wound check, patient reports wound in the left foot for last 2 weeks, initially started at the blister, for the last 3 days did progress where it had some foul-smelling odor and discharge, as well she reports her blood sugar was uncontrolled, she denies fever, chills, nausea or vomiting. -In ED lactic acid was 2.8, white blood cells was elevated at 14.7 K, blood glucose was elevated at 324, x-ray with no evidence of osteomyelitis, but she has significant wound for which she was started on Zosyn, and [facility] hospitalist consulted to admit   Review of systems:    In addition to the HPI above, No Fever-chills, No Headache, No changes with Vision or hearing, No problems swallowing food or Liquids, No Chest pain, Cough or Shortness of Breath, No Abdominal pain, No Nausea or Vommitting, Bowel movements are regular, No Blood in stool or Urine, No dysuria, She complains of diabetic ulcer, with discharge No new joints pains-aches,  No new weakness, tingling, numbness in any extremity, No recent weight gain or loss, No polyuria, polydypsia or polyphagia, No significant Mental Stressors.  A full 10 point Review of Systems was done, except as stated above, all other Review of Systems were negative.   With Past History of the  following :    Past Medical History:  Diagnosis Date  . CAP (community acquired pneumonia)    Streptococcus 01/2011  . COPD (chronic obstructive pulmonary disease) (HCC)   . Coronary atherosclerosis of native coronary artery    Diagnosed [place] 2006 - DES RCA, [facility]   . Dyslipidemia   . Essential hypertension, benign   . Morbid obesity (HCC)   . Pancreatitis   . Type 2 diabetes mellitus (HCC)       Past Surgical History:  Procedure Laterality Date  . ABDOMINAL HERNIA REPAIR    . ABDOMINAL HYSTERECTOMY    . CORONARY ANGIOPLASTY WITH STENT PLACEMENT  2006  . KNEE SURGERY    . NOSE SURGERY    . Pilonidal cystectomy    . TONSILLECTOMY        Social History:     Social History   Tobacco Use  . Smoking status: Current Every Day Smoker    Packs/day: 0.50  Years: 20.00    Pack years: 10.00    Types: Cigarettes  . Smokeless tobacco: Never Used  Substance Use Topics  . Alcohol use: No        Family History :     Family History  Problem Relation Age of Onset  . Diabetes Mother   . Heart failure Mother   . Hypertension Mother   . Kidney failure Mother   . Ovarian cancer Mother   . Asthma Son       Home Medications:   Prior to Admission medications   Medication Sig Start Date End Date Taking? Authorizing Provider  albuterol (PROVENTIL) (2.5 MG/3ML) 0.083% nebulizer solution Take 3 mLs (2.5 mg total) by nebulization every 6 (six) hours as needed for wheezing. 05/25/12   Nyoka Cowden, MD  albuterol (VENTOLIN HFA) 108 (90 BASE) MCG/ACT inhaler Inhale 2 puffs into the lungs as needed. Shortness of breath    [provider]  citalopram (CELEXA) 20 MG tablet Take 20 mg by mouth daily.    [provider]  clopidogrel (PLAVIX) 75 MG tablet Take 75 mg by mouth daily.      [provider]  famotidine (PEPCID) 20 MG tablet One at bedtime 05/25/12   Nyoka Cowden, MD  furosemide (LASIX) 40 MG tablet Take 1  tablet (40 mg total) by mouth 2 (two) times daily. 11/12/11   Erick Blinks, MD  insulin aspart (NOVOLOG) 100 UNIT/ML injection Inject 50 Units into the skin 3 (three) times daily before meals.      [provider]  insulin glargine (LANTUS) 100 UNIT/ML injection Inject 70-75 Units into the skin 2 (two) times daily. Take70 units in the morning and 75 units at bedtime    [provider]  metoprolol (LOPRESSOR) 50 MG tablet Take 50 mg by mouth 2 (two) times daily.      [provider]  olmesartan (BENICAR) 40 MG tablet Take 1 tablet (40 mg total) by mouth daily. 05/25/12   Nyoka Cowden, MD  pantoprazole (PROTONIX) 40 MG tablet Take 1 tablet (40 mg total) by mouth daily. Take 30-60 min before first meal of the day 05/25/12   Nyoka Cowden, MD  pravastatin (PRAVACHOL) 40 MG tablet Take 2 tablets (80 mg total) by mouth at bedtime. 04/11/12   [name], MD  promethazine-codeine (PHENERGAN WITH CODEINE) 6.25-10 MG/5ML syrup 1 or 2 tsp po q6h prn cough 07/16/12   Ivery Quale, PA-C       Allergies:     Allergies  Allergen Reactions  . Aspirin Anaphylaxis  . Bee Venom Anaphylaxis  . Food Anaphylaxis    PINEAPPLE  . Influenza Vaccines Anaphylaxis  . E-Mycin [Erythromycin Base] Nausea And Vomiting     Physical Exam:   Vitals  Blood pressure (!) 148/58, pulse 73, temperature 98.4 F (36.9 C), temperature source Oral, resp. rate 18, height 5\' 3"  (1.6 m), weight 131.1 kg, SpO2 97 %.   1. General developed female lying in bed in NAD,    2. Normal affect and insight, Not Suicidal or Homicidal, Awake Alert, Oriented X 3.  3. No F.N deficits, ALL C.Nerves Intact, Strength 5/5 all 4 extremities, Sensation intact all 4 extremities, Plantars down going.  4. Ears and Eyes appear Normal, Conjunctivae clear, PERRLA. Moist Oral Mucosa.  5. Supple Neck, No JVD, No cervical lymphadenopathy appriciated, No Carotid Bruits.  6. Symmetrical Chest wall movement, Good  air movement bilaterally, CTAB.  7. RRR, No Gallops, Rubs  or Murmurs, No Parasternal Heave.  8. Positive Bowel Sounds, Abdomen Soft, No tenderness, No organomegaly appriciated,No rebound -guarding or rigidity.  9.  Left lower extremity with cellulitis extending to mid shin area, she is with infected diabetic ulcer in the left lateral side, please see picture below, as well there appears to be moderate psoriasis on the right lower extremity as well.  10. Good muscle tone,  joints appear normal , no effusions, Normal ROM.  11. No Palpable Lymph Nodes in Neck or Axillae               Data Review:    CBC Recent Labs  Lab 12/05/19 1835  WBC 14.7*  HGB 12.7  HCT 39.3  PLT 244  MCV 95.6  MCH 30.9  MCHC 32.3  RDW 14.2  LYMPHSABS 3.6  MONOABS 1.0  EOSABS 0.4  BASOSABS 0.1   ------------------------------------------------------------------------------------------------------------------  Chemistries  Recent Labs  Lab 12/05/19 1835  NA 137  K 3.7  CL 94*  CO2 32  GLUCOSE 324*  BUN 16  CREATININE 0.89  CALCIUM 8.8*  AST 22  ALT 18  ALKPHOS 87  BILITOT 0.5   ------------------------------------------------------------------------------------------------------------------ estimated creatinine clearance is 94.6 mL/min (by C-G formula based on SCr of 0.89 mg/dL). ------------------------------------------------------------------------------------------------------------------ No results for input(s): TSH, T4TOTAL, T3FREE, THYROIDAB in the last 72 hours.  Invalid input(s): FREET3  Coagulation profile No results for input(s): INR, PROTIME in the last 168 hours. ------------------------------------------------------------------------------------------------------------------- No results for input(s): DDIMER in the last 72 hours. -------------------------------------------------------------------------------------------------------------------  Cardiac  Enzymes No results for input(s): CKMB, TROPONINI, MYOGLOBIN in the last 168 hours.  Invalid input(s): CK ------------------------------------------------------------------------------------------------------------------ No results found for: BNP   ---------------------------------------------------------------------------------------------------------------  Urinalysis    Component Value Date/Time   COLORURINE AMBER (A) 02/18/2011 2336   APPEARANCEUR CLEAR 02/18/2011 2336   LABSPEC 1.025 02/18/2011 2336   PHURINE 5.5 02/18/2011 2336   GLUCOSEU NEGATIVE 02/18/2011 2336   HGBUR NEGATIVE 02/18/2011 2336   BILIRUBINUR SMALL (A) 02/18/2011 2336   KETONESUR NEGATIVE 02/18/2011 2336   PROTEINUR 100 (A) 02/18/2011 2336   UROBILINOGEN 1.0 02/18/2011 2336   NITRITE NEGATIVE 02/18/2011 2336   LEUKOCYTESUR NEGATIVE 02/18/2011 2336    ----------------------------------------------------------------------------------------------------------------   Imaging Results:    DG Foot Complete Left  Result Date: 12/05/2019 CLINICAL DATA:  Diabetic ulceration EXAM: LEFT FOOT - COMPLETE 3+ VIEW COMPARISON:  November 07, 2009 FINDINGS: Frontal, oblique, and lateral views were obtained. Patient has had amputation at the level of the fifth MTP joint. Cortical remodeling is noted along the distal aspect of the fifth metatarsal. There is no acute fracture or dislocation. No erosive change or bony destruction. There are spurs arising from the posterior and inferior calcaneus. There is diffuse soft tissue swelling in the mid and forefoot regions. There is soft tissue ulceration medial to the first MTP joint. No associated radiopaque foreign body. An os naviculare is noted incidentally, an anatomic variant. IMPRESSION: 1. Soft tissue ulceration medial to the first MTP joint. No associated bony destruction or erosion in this area. 2. Status post amputation at the fifth MTP joint level with remodeling in the distal  fifth metatarsal. 3. No fracture or dislocation. No appreciable joint space narrowing. 4.  Diffuse soft tissue swelling in the mid and forefoot regions. 5.  Calcaneal spurs noted. Electronically Signed   By: Bretta Bang III M.D.   On: 12/05/2019 18:21   EKG not done in ED, will obtain one for baseline   Assessment & Plan:  Active Problems:   Coronary atherosclerosis of native coronary artery   Morbid obesity (HCC)   Diabetes mellitus type 2 in obese (HCC)   HTN (hypertension), benign   COPD (chronic obstructive pulmonary disease) (HCC)   TIA (transient ischemic attack)   Diabetic ulcer of left foot (HCC)   Infected left foot diabetic ulcer with surrounding cellulitis -Please see above pictures, x-ray with no evidence of osteomyelitis, but there is of significant size, will obtain MRI to rule out any osteomyelitis, will place consult to general surgery as she might need some bedside debridement, admitted under infected wound pathway, will keep on Rocephin and Flagyl, will consult wound care as well for wound care recommendation. -Have discussed with the patient, she recently moved into town, she will need to establish care with endocrinology podiatry and PCP  History of A. fib -Resuming Eliquis and beta-blockers  History of CAD status post PCI x3 -By reviewing on care everywhere continue with Plavix, statin, beta-blockers and lisinopril  Hypertension -Continue with home medications  Hyperlipidemia - Continue with statin  History of diastolic CHF -Most recent echo on care everywhere with EF 50% in February of this year, given hyperglycemia and elevated lactic acid on admission we will hold her Lasix for now  COPD - NoTIA active wheezing, continue with as needed neb   TIA -She is on Eliquis, Plavix and statin  Morbidly obese -She was counseled  Diabetes mellitus insulin-dependent -We will resume on Levemir, but will lower dose to 75 units twice daily instead of 100  units twice daily, will add an insulin sliding scale, will check A1c  Tobacco abuse -She was counseled, reports she is planning to quit, will start on nicotine patch  Obstructive  sleep apnea -Continue with CPAP nightly  DVT Prophylaxis on Eliquis  AM Labs Ordered, also please review Full Orders  Family Communication: Admission, patients condition and plan of care including tests being ordered have been discussed with the patient  who indicate understanding and agree with the plan and Code Status.  Code Status Full  Likely DC to  Home  Condition GUARDED    Consults called: General surgery consult requested in epic  Admission status: inpatient    Time spent in minutes : 60 minutes   Huey Bienenstock M.D on 12/05/2019 at 8:43 PM   [facility] - Office  [phone]

## 2019-12-05 NOTE — ED Provider Notes (Signed)
[facility] EMERGENCY DEPARTMENT Provider Note   CSN: [id number] Arrival date & time: 12/05/19  1604     History Chief Complaint  Patient presents with  . Wound Check    Nicole [name] is a 54 y.o. female.  HPI Patient is a 54 year old female with a history of COPD, HLD, HTN, morbid obesity, type 2 diabetes mellitus, who presents to the emergency department due to a wound on her left foot.  Patient states she recently relocated to [place] from [place] and does not have insurance or a PCP.  She states that about 2 weeks ago she had a blister pop on the plantar surface of the left foot.  She then began developing a wound on the left foot that has been worsening for the past 2 weeks.  She reports exquisite pain at the site and swelling as well as erythema through the left foot and left lower leg.  Pain worsens with ambulation.  No fevers, chills, nausea, vomiting.    Past Medical History:  Diagnosis Date  . CAP (community acquired pneumonia)    Streptococcus 01/2011  . COPD (chronic obstructive pulmonary disease) (HCC)   . Coronary atherosclerosis of native coronary artery    Diagnosed [place] 2006 - DES RCA, reports followup cath 2009 at [facility]   . Dyslipidemia   . Essential hypertension, benign   . Morbid obesity (HCC)   . Pancreatitis   . Type 2 diabetes mellitus [facility])     Patient Active Problem List   Diagnosis Date Noted  . Cough 05/26/2012  . TIA (transient ischemic attack) 04/10/2012  . Acute diastolic congestive heart failure (HCC) 11/12/2011  . COPD (chronic obstructive pulmonary disease) (HCC) 11/09/2011  . Dyspnea 06/10/2011  . CAP (community acquired pneumonia) 02/19/2011  . Coronary atherosclerosis of native coronary artery 02/19/2011  . Dyslipidemia   . Morbid obesity (HCC)   . Diabetes mellitus type 2 in obese (HCC)   . HTN (hypertension), benign     Past Surgical History:  Procedure Laterality Date  . ABDOMINAL HERNIA REPAIR    . ABDOMINAL  HYSTERECTOMY    . CORONARY ANGIOPLASTY WITH STENT PLACEMENT  2006  . KNEE SURGERY    . NOSE SURGERY    . Pilonidal cystectomy    . TONSILLECTOMY       OB History   No obstetric history on file.     Family History  Problem Relation Age of Onset  . Diabetes Mother   . Heart failure Mother   . Hypertension Mother   . Kidney failure Mother   . Ovarian cancer Mother   . Asthma Son     Social History   Tobacco Use  . Smoking status: Current Every Day Smoker    Packs/day: 0.50    Years: 20.00    Pack years: 10.00    Types: Cigarettes  . Smokeless tobacco: Never Used  Substance Use Topics  . Alcohol use: No  . Drug use: No    Home Medications Prior to Admission medications   Medication Sig Start Date End Date Taking? Authorizing Provider  albuterol (PROVENTIL) (2.5 MG/3ML) 0.083% nebulizer solution Take 3 mLs (2.5 mg total) by nebulization every 6 (six) hours as needed for wheezing. 05/25/12   Nyoka Cowden, MD  albuterol (VENTOLIN HFA) 108 (90 BASE) MCG/ACT inhaler Inhale 2 puffs into the lungs as needed. Shortness of breath    [provider]  citalopram (CELEXA) 20 MG tablet Take 20 mg by mouth daily.  [provider]  clopidogrel (PLAVIX) 75 MG tablet Take 75 mg by mouth daily.      [provider]  famotidine (PEPCID) 20 MG tablet One at bedtime 05/25/12   Nyoka Cowden, MD  furosemide (LASIX) 40 MG tablet Take 1 tablet (40 mg total) by mouth 2 (two) times daily. 11/12/11   Erick Blinks, MD  insulin aspart (NOVOLOG) 100 UNIT/ML injection Inject 50 Units into the skin 3 (three) times daily before meals.      [provider]  insulin glargine (LANTUS) 100 UNIT/ML injection Inject 70-75 Units into the skin 2 (two) times daily. Take70 units in the morning and 75 units at bedtime    [provider]  metoprolol (LOPRESSOR) 50 MG tablet Take 50 mg by mouth 2 (two) times daily.      [provider]  olmesartan (BENICAR)  40 MG tablet Take 1 tablet (40 mg total) by mouth daily. 05/25/12   Nyoka Cowden, MD  pantoprazole (PROTONIX) 40 MG tablet Take 1 tablet (40 mg total) by mouth daily. Take 30-60 min before first meal of the day 05/25/12   Nyoka Cowden, MD  pravastatin (PRAVACHOL) 40 MG tablet Take 2 tablets (80 mg total) by mouth at bedtime. 04/11/12   [name], MD  promethazine-codeine (PHENERGAN WITH CODEINE) 6.25-10 MG/5ML syrup 1 or 2 tsp po q6h prn cough 07/16/12   Ivery Quale, PA-C    Allergies    Aspirin, Bee venom, Food, Influenza vaccines, and E-mycin [erythromycin base]  Review of Systems   Review of Systems  Constitutional: Negative for chills and fever.  Gastrointestinal: Negative for nausea and vomiting.  Skin: Positive for color change and wound.   Physical Exam Updated Vital Signs BP (!) 138/51 (BP Location: Left Arm)   Pulse 76   Temp 98.4 F (36.9 C) (Oral)   Resp 18   Ht 5\' 3"  (1.6 m)   Wt 131.1 kg   SpO2 97%   BMI 51.19 kg/m   Physical Exam Vitals and nursing note reviewed.  Constitutional:      General: She is not in acute distress.    Appearance: Normal appearance. She is obese. She is not ill-appearing, toxic-appearing or diaphoretic.  HENT:     Head: Normocephalic and atraumatic.     Right Ear: External ear normal.     Left Ear: External ear normal.     Nose: Nose normal.     Mouth/Throat:     Mouth: Mucous membranes are moist.     Pharynx: Oropharynx is clear. No oropharyngeal exudate or posterior oropharyngeal erythema.  Eyes:     Extraocular Movements: Extraocular movements intact.  Cardiovascular:     Rate and Rhythm: Normal rate and regular rhythm.     Pulses: Normal pulses.     Heart sounds: Normal heart sounds. No murmur heard.  No friction rub. No gallop.   Pulmonary:     Effort: Pulmonary effort is normal. No respiratory distress.     Breath sounds: Normal breath sounds. No stridor. No wheezing, rhonchi or rales.  Abdominal:     General:  Abdomen is flat.     Tenderness: There is no abdominal tenderness.  Musculoskeletal:        General: Tenderness present. Normal range of motion.     Cervical back: Normal range of motion and neck supple. No tenderness.     Right lower leg: Edema present.     Left lower leg: Edema present.  Skin:  General: Skin is warm and dry.     Findings: Erythema and wound present.     Comments: Please see images below regarding left foot. 2+ pitting edema noted in the LLE. 1+ pitting edema noted in the RLE. LLE with increased warmth, diffuse tenderness, and erythema circumferentially up to the mid calf. Amputated left fifth toe. Palpable pedal pulses.   Neurological:     General: No focal deficit present.     Mental Status: She is alert and oriented to person, place, and time.  Psychiatric:        Mood and Affect: Mood normal.        Behavior: Behavior normal.         ED Results / Procedures / Treatments   Labs (all labs ordered are listed, but only abnormal results are displayed) Labs Reviewed  COMPREHENSIVE METABOLIC PANEL - Abnormal; Notable for the following components:      Result Value   Chloride 94 (*)    Glucose, Bld 324 (*)    Calcium 8.8 (*)    Albumin 3.3 (*)    All other components within normal limits  CBC WITH DIFFERENTIAL/PLATELET - Abnormal; Notable for the following components:   WBC 14.7 (*)    Neutro Abs 9.6 (*)    Abs Immature Granulocytes 0.08 (*)    All other components within normal limits  LACTIC ACID, PLASMA - Abnormal; Notable for the following components:   Lactic Acid, Venous 2.8 (*)    All other components within normal limits  CULTURE, BLOOD (ROUTINE X 2)  CULTURE, BLOOD (ROUTINE X 2)  RESPIRATORY PANEL BY RT PCR (FLU A&B, COVID)  LACTIC ACID, PLASMA  URINALYSIS, ROUTINE W REFLEX MICROSCOPIC   EKG None  Radiology DG Foot Complete Left  Result Date: 12/05/2019 CLINICAL DATA:  Diabetic ulceration EXAM: LEFT FOOT - COMPLETE 3+ VIEW COMPARISON:   November 07, 2009 FINDINGS: Frontal, oblique, and lateral views were obtained. Patient has had amputation at the level of the fifth MTP joint. Cortical remodeling is noted along the distal aspect of the fifth metatarsal. There is no acute fracture or dislocation. No erosive change or bony destruction. There are spurs arising from the posterior and inferior calcaneus. There is diffuse soft tissue swelling in the mid and forefoot regions. There is soft tissue ulceration medial to the first MTP joint. No associated radiopaque foreign body. An os naviculare is noted incidentally, an anatomic variant. IMPRESSION: 1. Soft tissue ulceration medial to the first MTP joint. No associated bony destruction or erosion in this area. 2. Status post amputation at the fifth MTP joint level with remodeling in the distal fifth metatarsal. 3. No fracture or dislocation. No appreciable joint space narrowing. 4.  Diffuse soft tissue swelling in the mid and forefoot regions. 5.  Calcaneal spurs noted. Electronically Signed   By: Bretta Bang III M.D.   On: 12/05/2019 18:21    Procedures Procedures (including critical care time)  Medications Ordered in ED Medications  sodium chloride 0.9 % bolus 1,000 mL (has no administration in time range)  piperacillin-tazobactam (ZOSYN) IVPB 3.375 g (3.375 g Intravenous New Bag/Given 12/05/19 1923)    ED Course  I have reviewed the triage vital signs and the nursing notes.  Pertinent labs & imaging results that were available during my care of the patient were reviewed by me and considered in my medical decision making (see chart for details).  Clinical Course as of Dec 05 2023  Wed Dec 05, 2019  [id number]  1. Soft tissue ulceration medial to the first MTP joint. No associated bony destruction or erosion in this area.  2. Status post amputation at the fifth MTP joint level with remodeling in the distal fifth metatarsal.  3. No fracture or dislocation. No appreciable joint  space narrowing.  4. Diffuse soft tissue swelling in the mid and forefoot regions.  5. Calcaneal spurs noted.  DG Foot Complete Left [LJ]  1927 WBC(!): 14.7 [LJ]  1928 NEUT#(!): 9.6 [LJ]  1943 Lactic Acid, Venous(!!): 2.8 [LJ]    Clinical Course User Index [LJ] Placido SouJoldersma, Amalya Salmons, PA-C   MDM Rules/Calculators/A&P                          Patient is a 54 year old female who presents to the emergency department due to a diabetic ulcer on the left foot and worsening cellulitis through the left foot and left lower leg.  Please see images above for more specific details.  Patient has significant circumferential tenderness around the ankle and lower leg.  She is afebrile and not tachycardic on this visit.  Not hypoxic.  I obtained basic labs as well as blood cultures and a lactic acid.  She has a leukocytosis of 14.7 with a neutrophilia of 9.6.  Elevated lactic acid at 2.8.  Patient was given a dose of Zosyn and started on IV fluids.  Patient is new to the area and does not have a PCP or any outpatient follow-up.  Feel that it is best to admit the patient for IV antibiotics and observation.  This was discussed with the hospitalist team who will accept the patient to their care.  COVID-19 test has been ordered.  Final Clinical Impression(s) / ED Diagnoses Final diagnoses:  Diabetic foot infection (HCC)  Cellulitis of left lower extremity   Rx / DC Orders ED Discharge Orders    None       Placido SouJoldersma, Ilze Roselli, PA-C 12/05/19 2026    Terrilee FilesButler, Michael C, MD 12/06/19 1045

## 2019-12-05 NOTE — ED Triage Notes (Signed)
Pt to er, pt states that she is here for a wound on her foot, states that she has had the wound for the past two weeks, states that it started as a blister that popped and now it isn't healing.

## 2019-12-05 NOTE — ED Notes (Signed)
Patient transported to X-ray 

## 2019-12-06 ENCOUNTER — Inpatient Hospital Stay (HOSPITAL_COMMUNITY): Payer: Medicare (Managed Care)

## 2019-12-06 DIAGNOSIS — J449 Chronic obstructive pulmonary disease, unspecified: Secondary | ICD-10-CM | POA: Diagnosis not present

## 2019-12-06 DIAGNOSIS — E669 Obesity, unspecified: Secondary | ICD-10-CM

## 2019-12-06 DIAGNOSIS — E11621 Type 2 diabetes mellitus with foot ulcer: Secondary | ICD-10-CM | POA: Diagnosis not present

## 2019-12-06 DIAGNOSIS — E1169 Type 2 diabetes mellitus with other specified complication: Secondary | ICD-10-CM | POA: Diagnosis not present

## 2019-12-06 DIAGNOSIS — L03116 Cellulitis of left lower limb: Secondary | ICD-10-CM | POA: Diagnosis not present

## 2019-12-06 LAB — COMPREHENSIVE METABOLIC PANEL
ALT: 16 U/L (ref 0–44)
AST: 25 U/L (ref 15–41)
Albumin: 2.9 g/dL — ABNORMAL LOW (ref 3.5–5.0)
Alkaline Phosphatase: 80 U/L (ref 38–126)
Anion gap: 12 (ref 5–15)
BUN: 19 mg/dL (ref 6–20)
CO2: 27 mmol/L (ref 22–32)
Calcium: 8.1 mg/dL — ABNORMAL LOW (ref 8.9–10.3)
Chloride: 97 mmol/L — ABNORMAL LOW (ref 98–111)
Creatinine, Ser: 0.89 mg/dL (ref 0.44–1.00)
GFR, Estimated: >60 mL/min (ref >60)
Glucose, Bld: 298 mg/dL — ABNORMAL HIGH (ref 70–99)
Potassium: 3.7 mmol/L (ref 3.5–5.1)
Sodium: 136 mmol/L (ref 135–145)
Total Bilirubin: 0.4 mg/dL (ref 0.3–1.2)
Total Protein: 6.3 g/dL — ABNORMAL LOW (ref 6.5–8.1)

## 2019-12-06 LAB — URINALYSIS, ROUTINE W REFLEX MICROSCOPIC
Bilirubin Urine: NEGATIVE
Glucose, UA: 50 mg/dL — AB
Hgb urine dipstick: NEGATIVE
Ketones, ur: NEGATIVE mg/dL
Leukocytes,Ua: NEGATIVE
Nitrite: NEGATIVE
Protein, ur: 100 mg/dL — AB
Specific Gravity, Urine: 1.013 (ref 1.005–1.030)
pH: 5 (ref 5.0–8.0)

## 2019-12-06 LAB — CBC
HCT: 36.8 % (ref 36.0–46.0)
Hemoglobin: 11.8 g/dL — ABNORMAL LOW (ref 12.0–15.0)
MCH: 30.5 pg (ref 26.0–34.0)
MCHC: 32.1 g/dL (ref 30.0–36.0)
MCV: 95.1 fL (ref 80.0–100.0)
Platelets: 207 10*3/uL (ref 150–400)
RBC: 3.87 MIL/uL (ref 3.87–5.11)
RDW: 14.3 % (ref 11.5–15.5)
WBC: 13 10*3/uL — ABNORMAL HIGH (ref 4.0–10.5)
nRBC: 0 % (ref 0.0–0.2)

## 2019-12-06 LAB — CBG MONITORING, ED
Glucose-Capillary: 274 mg/dL — ABNORMAL HIGH (ref 70–99)
Glucose-Capillary: 315 mg/dL — ABNORMAL HIGH (ref 70–99)

## 2019-12-06 LAB — PREALBUMIN: Prealbumin: 17.9 mg/dL — ABNORMAL LOW (ref 18–38)

## 2019-12-06 LAB — CULTURE, BLOOD (ROUTINE X 2)

## 2019-12-06 LAB — HIV ANTIBODY (ROUTINE TESTING W REFLEX): HIV Screen 4th Generation wRfx: NONREACTIVE

## 2019-12-06 LAB — GLUCOSE, CAPILLARY
Glucose-Capillary: 206 mg/dL — ABNORMAL HIGH (ref 70–99)
Glucose-Capillary: 275 mg/dL — ABNORMAL HIGH (ref 70–99)

## 2019-12-06 MED ORDER — PNEUMOCOCCAL VAC POLYVALENT 25 MCG/0.5ML IJ INJ: 0.5000 mL | INJECTION | INTRAMUSCULAR | Status: DC

## 2019-12-06 MED ORDER — MUPIROCIN 2 % EX OINT
TOPICAL_OINTMENT | Freq: Every day | CUTANEOUS | Status: DC
  Filled 2019-12-06: qty 22

## 2019-12-06 NOTE — Consult Note (Signed)
WOC Nurse Consult Note: Remote consult completed.  Photos are in the chart.  Will need wound measurements with skilled nursing skin assessment.  Reason for Consult: Neuropathic ulcer to left great toe, medial aspect.   Previous amputation left fifth metatarsal.  Cellulitis to lower legs L>R.  All remaining toes with scabbed abrasions to tips.  BIlateral legs are erythematous, tender to touch and edematous.  Chronic skin changes to lower legs.  MRI is negative for osteomyelitis.  Is now on Zosyn.  Social needs include recent relocation here from another state and insurance coverage.   Wound type:infectious/neuropathic Pressure Injury POA: NA Measurement:bedside RN to obtain with skin assessment.  Wound IEP:PIRJ toe is ruddy red  Scabbed abrasions to toes.  Drainage (amount, consistency, odor) moderate serosanguinous  Musty odor recorded  Periwound:Callous to left great toe wound. Chronic skin changes and cellulitis to lower legs.  Scattered intact blisters noted.  Dressing procedure/placement/frequency: Awaiting surgery consult.  Any orders implemented by surgery will supercede these.   Cleanse bilateral lower legs with soap and water and pat dry.   Fill wound to left great toe with iodoform packing strip.  Cover with dry gauze and kerlix/tape.  Change daily.   Cleanse wounds to  tips of toes with soap and water and pat dry. Apply mupirocin ointment and cover with dry dressings. Change daily.   Will not follow at this time.  Please re-consult if needed.  Maple Hudson MSN, RN, FNP-BC CWON Wound, Ostomy, Continence Nurse Pager [phone]

## 2019-12-06 NOTE — Plan of Care (Signed)

## 2019-12-06 NOTE — Consult Note (Signed)
WOC Nurse Consult Note: Patient receiving care in [facility]. Reason for Consult: foot wound. Orders were placed by WOC colleague Maple Hudson this a.m. Dr. Veneda Melter notified. Current WOC order "completed". Helmut Muster, RN, MSN, CWOCN, CNS-BC, pager [phone]

## 2019-12-06 NOTE — Progress Notes (Signed)
Initial Nutrition Assessment  DOCUMENTATION CODES:   Morbid obesity  INTERVENTION:  Glucerna Shake po BID, each supplement provides 220 kcal and 10 grams of protein  Ensure Max po daily, each supplement provides 150 kcal and 30 grams of protein  Juven BID, each packet provides 95 calories, 2.5 grams of protein (collagen), and 9.8 grams of carbohydrate (3 grams sugar); also contains 7 grams of L-arginine and L-glutamine, 300 mg vitamin C, 15 mg vitamin E, 1.2 mcg vitamin B-12, 9.5 mg zinc, 200 mg calcium, and 1.5 g  Calcium Beta-hydroxy-Beta-methylbutyrate to support wound healing  MVI with minerals daily   NUTRITION DIAGNOSIS:   Increased nutrient needs related to wound healing (diabetic left foot ulcer with cellulitis) as evidenced by estimated needs.    GOAL:   Patient will meet greater than or equal to 90% of their needs    MONITOR:   Weight trends, Labs, Supplement acceptance, PO intake, I & O's, Skin  REASON FOR ASSESSMENT:   Consult Wound healing  ASSESSMENT:  RD working remotely.  54 year old female with history of COPD, HLD, HTN, morbid obesity, IDDM2, CAD s/p PCI, chronic dCHF, A fib on Eliquis, OSA on CPAP, presented with left foot wound that has been worsening over the past 3 days. Pt admitted for infected left foot diabetic ulcer with surrounding cellulitis.  RD attempted to contact pt via phone this morning, however she did not answer, unable to obtain nutrition history at this time. Per chart, she ate 75% of breakfast tray. Will order Glucerna and Ensure Max to aid with meeting needs as well as Juven to support wound healing.  No recent weight history for review, last weight prior to admission 154.2 kg on 07/15/12.  Medications reviewed and include: SSI, Levemir 75 units twice daily, Protonix, Rocephin, Flagyl Labs: CBGs 275, 206, 315, WBC 12.0 (H) No recent A1c (6.9 on 04/10/2012)  NUTRITION - FOCUSED PHYSICAL EXAM: Unable to complete at this time, RD  working remotely.  Diet Order:   Diet Order            Diet heart healthy/carb modified Room service appropriate? Yes; Fluid consistency: Thin  Diet effective now                 EDUCATION NEEDS:   Not appropriate for education at this time  Skin:  Skin Assessment: Skin Integrity Issues: Skin Integrity Issues:: Other (Comment) Other: cellulitis; left toe  Last BM:  10/14  Height:   Ht Readings from Last 1 Encounters:  12/05/19 5\' 3"  (1.6 m)    Weight:   Wt Readings from Last 1 Encounters:  12/05/19 131.1 kg    BMI:  Body mass index is 51.19 kg/m.  Estimated Nutritional Needs:   Kcal:  2200-2400  Protein:  110-120  Fluid:  >/= 2.2 L/day   12/07/19, RD, LDN Clinical Nutrition After Hours/Weekend Pager # in Amion

## 2019-12-06 NOTE — Progress Notes (Addendum)
Inpatient Diabetes Program Recommendations  AACE/ADA: New Consensus Statement on Inpatient Glycemic Control  Target Ranges:  Prepandial:   less than 140 mg/dL      Peak postprandial:   less than 180 mg/dL (1-2 hours)      Critically ill patients:  140 - 180 mg/dL  Results for Nicole [name], Nicole [name] (MRN [id number]) as of 12/06/2019 09:55  Ref. Range 12/05/2019 22:06 12/06/2019 07:38  Glucose-Capillary Latest Ref Range: 70 - 99 mg/dL 169 (H) 678 (H)  Results for Nicole [name], Nicole [name] (MRN [id number]) as of 12/06/2019 09:55  Ref. Range 12/05/2019 18:35  Glucose Latest Ref Range: 70 - 99 mg/dL 025 (H)   Results for Nicole [name], Nicole [name] (MRN [id number]) as of 12/06/2019 09:55  Ref. Range 04/10/2012 17:49  Hemoglobin A1C Latest Ref Range: <5.7 % 6.9 (H)   Review of Glycemic Control  Diabetes history: DM2 Outpatient Diabetes medications: Glipizide 10 mg BID, Levemir 100 units BID, Novolog 60 units TID with meals, Regular per correction scale Current orders for Inpatient glycemic control: Levemir 75 units BID, Novolog 0-20 units TID with meals, Novolog 0-5 units QHS  Inpatient Diabetes Program Recommendations:    HbgA1C: Please consider ordering an A1C to evaluate glycemic control over the past 2-3 months.  NOTE: Noted consult for diabetes coordinator. Chart reviewed. Patient is currently in the Emergency Room and being admitted with foot infection. Initial glucose 324 mg/dl and fasting glucose 353 mg/dl this morning. Will plan to talk with patient today.  Addendum 12/06/19@13 :20-Spoke with patient about diabetes and home regimen for diabetes control. Patient reports that she was seeing PCP in [place] for DM management. Patient states that she is taking Glipizide 10 mg BID, Levemir 100 units BID, Novolog 60 units TID with meals, and Regular 4-20 units TID per correction scale as an outpatient for diabetes control. Patient reports taking DM medications as prescribed.  Patient states she came from [place] to  [place] about 1 month ago and she has not established or made an appointment to establish care in [facility] with a doctor yet.  Patient reports that she checks glucose 4 times a day and it ranges from 160-400's mg/dl and she rarely has hypoglycemia (if she takes Novolog and does not eat). Patient reports that she has all needed testing supplies and DM medications for DM management and she does not need refills on any of her DM medications at time of discharge.  Inquired about prior A1C and patient reports her last A1C was >13% at last check by PCP in [place].  Discussed glucose and A1C goals. Discussed importance of checking CBGs and maintaining good CBG control to prevent long-term and short-term complications. Explained how hyperglycemia leads to damage within blood vessels which lead to the common complications seen with uncontrolled diabetes. Stressed to the patient the importance of improving glycemic control to prevent further complications from uncontrolled diabetes. Discussed impact of nutrition, exercise, stress, sickness, and medications on diabetes control. Patient states that a lot of her health issues are related to diabetes control and she is aware of the importance of improving DM control to decrease risk of further complications from uncontrolled DM. Encouraged patient to call and make an appointment to establish care with a local PCP. Also informed patient that Dr. Fransico Him was local Endocrinologist that she may want to consider establishing care with him as well for assistance with improving DM control.  Patient verbalized understanding of information discussed and reports no further questions at this time related to diabetes. Patient received  Levemir 75 units last night but has not received Levemir 75 units today (RN reports waiting on pharmacy to send).   Thanks, Orlando Penner, RN, MSN, CDE Diabetes Coordinator Inpatient Diabetes Program [phone] (Team Pager from 8am to 5pm)

## 2019-12-06 NOTE — Progress Notes (Signed)
PROGRESS NOTE    Nat MathLisa M Haft  GNF:[id number] DOB: 12/16/1965 DOA: 12/05/2019 PCP: Pcp, No    Brief Narrative:  Nicole [name]  is a 54 y.o. female, with past medical history of COPD, hyperlipidemia, hypertension, morbid obesity, type 2 diabetes mellitus insulin-dependent, CAD s/p PCI, chronic diastolic CHF, A. fib on Eliquis, OSA on CPAP, patient presents to ED for wound check, patient reports wound in the left foot for last 2 weeks, initially started at the blister, for the last 3 days did progress where it had some foul-smelling odor and discharge, as well she reports her blood sugar was uncontrolled, she denies fever, chills, nausea or vomiting. -In ED lactic acid was 2.8, white blood cells was elevated at 14.7 K, blood glucose was elevated at 324, x-ray with no evidence of osteomyelitis, but she has significant wound for which she was started on Zosyn, and [facility] hospitalist consulted to admit   Assessment & Plan:   Active Problems:   Coronary atherosclerosis of native coronary artery   Morbid obesity (HCC)   Diabetes mellitus type 2 in obese (HCC)   HTN (hypertension), benign   COPD (chronic obstructive pulmonary disease) (HCC)   TIA (transient ischemic attack)   Diabetic ulcer of left foot (HCC)   Left foot diabetic ulcer with associated cellulitis -MRI negative for underlying osteo or abscess -General surgery consulted to see if debridement is indicated -Appreciate WOC recommendations -Continue on IV antibiotics  Atrial fibrillation -Heart rate currently stable on beta-blockers -Anticoagulated with Eliquis  History of coronary artery disease status post stents in the past -No complaints of chest pain at this time -Continue on Plavix, statin, beta-blockers and lisinopril  Hypertension -Blood pressure stable on current medications  Chronic diastolic congestive heart failure. -Ejection fraction of 50% in 03/2019 -Since she had elevated lactic acid on admission, holding  Lasix for now  COPD -No wheezing at this time -Continue bronchodilators as needed  Morbid obesity. -Counseled on importance of diet and exercise  Obstructive sleep apnea -Continue on CPAP   DVT prophylaxis:  apixaban (ELIQUIS) tablet 5 mg  Code Status: Full code Family Communication: Discussed with patient Disposition Plan: Status is: Inpatient  Remains inpatient appropriate because:IV treatments appropriate due to intensity of illness or inability to take PO   Dispo: The patient is from: Home              Anticipated d/c is to: Home              Anticipated d/c date is: 1 day              Patient currently is not medically stable to d/c.         Consultants:   General surgery  Procedures:     Antimicrobials:   Ceftriaxone 10/13 >  Flagyl 10/13 >   Subjective: Has some soreness in her left foot.  No other complaints  Objective: Vitals:   12/06/19 0600 12/06/19 0800 12/06/19 1327 12/06/19 1711  BP: (!) 153/68 107/62 (!) 145/55 (!) 108/57  Pulse: 69 63 65 64  Resp: 17  15 18   Temp:   97.9 F (36.6 C)   TempSrc:   Oral   SpO2: 93% 98% 99% 100%  Weight:      Height:        Intake/Output Summary (Last 24 hours) at 12/06/2019 2021 Last data filed at 12/06/2019 1600 Gross per 24 hour  Intake 1318.48 ml  Output --  Net 1318.48 ml   Ceasar MonsFiled  Weights   12/05/19 1612  Weight: 131.1 kg    Examination:  General exam: Appears calm and comfortable  Respiratory system: Clear to auscultation. Respiratory effort normal. Cardiovascular system: S1 & S2 heard, RRR. No JVD, murmurs, rubs, gallops or clicks. No pedal edema. Gastrointestinal system: Abdomen is nondistended, soft and nontender. No organomegaly or masses felt. Normal bowel sounds heard. Central nervous system: Alert and oriented. No focal neurological deficits. Extremities: Symmetric 5 x 5 power. Skin: Wound on medial aspect of left foot with surrounding erythema/cellulitis Psychiatry:  Judgement and insight appear normal. Mood & affect appropriate.     Data Reviewed: I have personally reviewed following labs and imaging studies  CBC: Recent Labs  Lab 12/05/19 1835 12/06/19 0614  WBC 14.7* 13.0*  NEUTROABS 9.6*  --   HGB 12.7 11.8*  HCT 39.3 36.8  MCV 95.6 95.1  PLT 244 207   Basic Metabolic Panel: Recent Labs  Lab 12/05/19 1835 12/06/19 0614  NA 137 136  K 3.7 3.7  CL 94* 97*  CO2 32 27  GLUCOSE 324* 298*  BUN 16 19  CREATININE 0.89 0.89  CALCIUM 8.8* 8.1*   GFR: Estimated Creatinine Clearance: 94.6 mL/min (by C-G formula based on SCr of 0.89 mg/dL). Liver Function Tests: Recent Labs  Lab 12/05/19 1835 12/06/19 0614  AST 22 25  ALT 18 16  ALKPHOS 87 80  BILITOT 0.5 0.4  PROT 7.1 6.3*  ALBUMIN 3.3* 2.9*   No results for input(s): LIPASE, AMYLASE in the last 168 hours. No results for input(s): AMMONIA in the last 168 hours. Coagulation Profile: No results for input(s): INR, PROTIME in the last 168 hours. Cardiac Enzymes: No results for input(s): CKTOTAL, CKMB, CKMBINDEX, TROPONINI in the last 168 hours. BNP (last 3 results) No results for input(s): PROBNP in the last 8760 hours. HbA1C: No results for input(s): HGBA1C in the last 72 hours. CBG: Recent Labs  Lab 12/05/19 2206 12/06/19 0738 12/06/19 1126 12/06/19 1633  GLUCAP 244* 274* 315* 206*   Lipid Profile: No results for input(s): CHOL, HDL, LDLCALC, TRIG, CHOLHDL, LDLDIRECT in the last 72 hours. Thyroid Function Tests: No results for input(s): TSH, T4TOTAL, FREET4, T3FREE, THYROIDAB in the last 72 hours. Anemia Panel: No results for input(s): VITAMINB12, FOLATE, FERRITIN, TIBC, IRON, RETICCTPCT in the last 72 hours. Sepsis Labs: Recent Labs  Lab 12/05/19 1835 12/05/19 2007  LATICACIDVEN 2.8* 2.7*    Recent Results (from the past 240 hour(s))  Blood culture (routine x 2)     Status: None (Preliminary result)   Collection Time: 12/05/19  6:36 PM   Specimen: Right  Antecubital; Blood  Result Value Ref Range Status   Specimen Description RIGHT ANTECUBITAL  Final   Special Requests   Final    BOTTLES DRAWN AEROBIC AND ANAEROBIC Blood Culture adequate volume   Culture   Final    NO GROWTH < 12 HOURS Performed at [facility], [address] [place], [place] [address]    Report Status PENDING  Incomplete  Blood culture (routine x 2)     Status: None (Preliminary result)   Collection Time: 12/05/19  6:36 PM   Specimen: Left Antecubital; Blood  Result Value Ref Range Status   Specimen Description LEFT ANTECUBITAL  Final   Special Requests   Final    BOTTLES DRAWN AEROBIC AND ANAEROBIC Blood Culture adequate volume   Culture   Final    NO GROWTH < 12 HOURS Performed at [facility], [address] [place],  [place] [address]    Report Status PENDING  Incomplete  Respiratory Panel by RT PCR (Flu A&B, Covid) - Nasopharyngeal Swab     Status: None   Collection Time: 12/05/19  8:37 PM   Specimen: Nasopharyngeal Swab  Result Value Ref Range Status   SARS Coronavirus 2 by RT PCR NEGATIVE NEGATIVE Final    Comment: (NOTE) SARS-CoV-2 target nucleic acids are NOT DETECTED.  The SARS-CoV-2 RNA is generally detectable in upper respiratoy specimens during the acute phase of infection. The lowest concentration of SARS-CoV-2 viral copies this assay can detect is 131 copies/mL. A negative result does not preclude SARS-Cov-2 infection and should not be used as the sole basis for treatment or other patient management decisions. A negative result may occur with  improper specimen collection/handling, submission of specimen other than nasopharyngeal swab, presence of viral mutation(s) within the areas targeted by this assay, and inadequate number of viral copies (<131 copies/mL). A negative result must be combined with clinical observations, patient history, and epidemiological information. The expected result is Negative.  Fact Sheet for Patients:    https://www.moore.com/  Fact Sheet for Healthcare Providers:  https://www.young.biz/  This test is no t yet approved or cleared by the [place] FDA and  has been authorized for detection and/or diagnosis of SARS-CoV-2 by FDA under an Emergency Use Authorization (EUA). This EUA will remain  in effect (meaning this test can be used) for the duration of the COVID-19 declaration under Section 564(b)(1) of the Act, 21 U.S.C. section 360bbb-3(b)(1), unless the authorization is terminated or revoked sooner.     Influenza A by PCR NEGATIVE NEGATIVE Final   Influenza B by PCR NEGATIVE NEGATIVE Final    Comment: (NOTE) The Xpert Xpress SARS-CoV-2/FLU/RSV assay is intended as an aid in  the diagnosis of influenza from Nasopharyngeal swab specimens and  should not be used as a sole basis for treatment. Nasal washings and  aspirates are unacceptable for Xpert Xpress SARS-CoV-2/FLU/RSV  testing.  Fact Sheet for Patients: https://www.moore.com/  Fact Sheet for Healthcare Providers: https://www.young.biz/  This test is not yet approved or cleared by the [place] FDA and  has been authorized for detection and/or diagnosis of SARS-CoV-2 by  FDA under an Emergency Use Authorization (EUA). This EUA will remain  in effect (meaning this test can be used) for the duration of the  Covid-19 declaration under Section 564(b)(1) of the Act, 21  U.S.C. section 360bbb-3(b)(1), unless the authorization is  terminated or revoked. Performed at [facility], [address], [place], [place] [address]          Radiology Studies: MR FOOT LEFT WO CONTRAST  Result Date: 12/06/2019 CLINICAL DATA:  Foot ulcer with swelling, diabetes. EXAM: MRI OF THE LEFT FOOT WITHOUT CONTRAST TECHNIQUE: Multiplanar, multisequence MR imaging of the left forefoot was performed. No intravenous contrast was administered. COMPARISON:   Radiographs 12/05/2019 FINDINGS: Despite efforts by the technologist and patient, motion artifact is present on today's exam and could not be eliminated. This reduces exam sensitivity and specificity. Fat saturation failed on the coronal T2 weighted images despite repeats. Bones/Joint/Cartilage Prior amputation of the small toe at the MTP joint. Low-grade edema signal in the base of the third and second metatarsals and in the adjacent kidney a forms, thought to be degenerative given the associated spurring. No malalignment at the Lisfranc joint. There also degenerative findings between the middle and lateral cuneiforms. No osteomyelitis is identified. Ligaments The Lisfranc ligament appears intact. Muscles and Tendons Low-level edema  within along the atrophic plantar musculature of the foot, probably reflecting low-grade denervation edema, less likely to be from myositis. Soft tissues Cutaneous ulceration along the plantar-medial margin of the medial ball of the foot just distal to the level of the first MTP joint. In addition to the cutaneous defect shown on image 19 of series 5, there is localized abnormal inflammation in the adjacent subcutaneous tissues along with cutaneous thickening in the region, compatible with cellulitis. No osseous edema or bony destructive findings to indicate osteomyelitis. Edema tracks in the dorsum of the foot, cellulitis is not excluded. IMPRESSION: 1. Cutaneous ulceration along the plantar-medial margin of the medial ball of the foot just distal to the level of the first MTP joint. Adjacent cellulitis is present and may be tracking along the dorsum of the foot. No drainable abscess, osteomyelitis, or gas tracking in the soft tissues observed. 2. Low-level edema within along the atrophic plantar musculature of the foot, probably reflecting low-grade denervation edema, less likely to be from myositis. 3. Edema tracks in the dorsum of the foot, cellulitis is not excluded. 4.  Degenerative findings in the middle and lateral cuneiforms and in the bases of the second and third metatarsals. 5. Prior amputation of the small toe at the MTP joint. Electronically Signed   By: Gaylyn Rong M.D.   On: 12/06/2019 08:33   DG Foot Complete Left  Result Date: 12/05/2019 CLINICAL DATA:  Diabetic ulceration EXAM: LEFT FOOT - COMPLETE 3+ VIEW COMPARISON:  November 07, 2009 FINDINGS: Frontal, oblique, and lateral views were obtained. Patient has had amputation at the level of the fifth MTP joint. Cortical remodeling is noted along the distal aspect of the fifth metatarsal. There is no acute fracture or dislocation. No erosive change or bony destruction. There are spurs arising from the posterior and inferior calcaneus. There is diffuse soft tissue swelling in the mid and forefoot regions. There is soft tissue ulceration medial to the first MTP joint. No associated radiopaque foreign body. An os naviculare is noted incidentally, an anatomic variant. IMPRESSION: 1. Soft tissue ulceration medial to the first MTP joint. No associated bony destruction or erosion in this area. 2. Status post amputation at the fifth MTP joint level with remodeling in the distal fifth metatarsal. 3. No fracture or dislocation. No appreciable joint space narrowing. 4.  Diffuse soft tissue swelling in the mid and forefoot regions. 5.  Calcaneal spurs noted. Electronically Signed   By: Bretta Bang III M.D.   On: 12/05/2019 18:21        Scheduled Meds: . apixaban  5 mg Oral BID  . clopidogrel  75 mg Oral Daily  . insulin aspart  0-20 Units Subcutaneous TID WC  . insulin aspart  0-5 Units Subcutaneous QHS  . insulin detemir  75 Units Subcutaneous BID  . lisinopril  2.5 mg Oral Daily  . metoprolol tartrate  50 mg Oral BID  . mupirocin ointment   Topical Daily  . nicotine  21 mg Transdermal Daily  . pantoprazole  40 mg Oral Daily  . [START ON 12/07/2019] pneumococcal 23 valent vaccine  0.5 mL  Intramuscular Tomorrow-1000  . simvastatin  40 mg Oral q1800   Continuous Infusions: . cefTRIAXone (ROCEPHIN)  IV Stopped (12/05/19 2332)   And  . metronidazole 100 mL/hr at 12/06/19 1600     LOS: 1 day    Time spent:    Erick Blinks, MD [facility]   If 7PM-7AM, please contact night-coverage [email or web address]  12/06/2019,  8:21 PM

## 2019-12-06 NOTE — TOC Initial Note (Signed)
Transition of Care [facility]) - Initial/Assessment Note   Patient Details  Name: Nicole [name] MRN: [id number] Date of Birth: 1965/10/26  Transition of Care [facility]) CM/SW Contact:    Ewing Schlein, LCSW Phone Number: 12/06/2019, 1:19 PM  Clinical Narrative: Patient is a 54 year old female who was admitted for COPD. Patient has a history of coronary atherosclerosis, diabetes mellitus type 2, HTN, TIA, and diabetic ulcer of left foot. TOC received consult for HH/DME needs. CSW spoke with patient to complete assessment. Per patient, she recently moved to the area and does not currently have a PCP. Patient reported she is able to complete her ADLs independently and her only DME is a shower chair. CSW asked about [facility] services. Patient reported she has had HH in the past, but declined HH services at this time. CSW provided patient with local PCP resource list. TOC to follow for discharge needs.  Expected Discharge Plan: Home/Self Care Barriers to Discharge: Continued Medical Work up  Patient Goals and CMS Choice Patient states their goals for this hospitalization and ongoing recovery are:: Discharge home CMS Medicare.gov Compare Post Acute Care list provided to:: Patient Choice offered to / list presented to : Patient  Expected Discharge Plan and Services Expected Discharge Plan: Home/Self Care In-house Referral: Clinical Social Work Discharge Planning Services: NA Post Acute Care Choice: NA Living arrangements for the past 2 months: Single Family Home  Prior Living Arrangements/Services Living arrangements for the past 2 months: Single Family Home Patient language and need for interpreter reviewed:: Yes Do you feel safe going back to the place where you live?: Yes      Need for Family Participation in Patient Care: No (Comment) Care giver support system in place?: Yes (comment) 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) Criminal Activity/Legal Involvement Pertinent to Current  Situation/Hospitalization: No - Comment as needed  Emotional Assessment Appearance:: Appears stated age Attitude/Demeanor/Rapport: Engaged Affect (typically observed): Accepting Orientation: : Oriented to Self, Oriented to Place, Oriented to  Time, Oriented to Situation Alcohol / Substance Use: Tobacco Use Psych Involvement: No (comment)  Admission diagnosis:  Cellulitis of left lower extremity [L03.116] Diabetic foot infection (HCC) [D98.338, L08.9] Diabetic ulcer of left foot (HCC) [S50.539, L97.529] Patient Active Problem List   Diagnosis Date Noted  . Diabetic ulcer of left foot (HCC) 12/05/2019  . Cough 05/26/2012  . TIA (transient ischemic attack) 04/10/2012  . Acute diastolic congestive heart failure (HCC) 11/12/2011  . COPD (chronic obstructive pulmonary disease) (HCC) 11/09/2011  . Dyspnea 06/10/2011  . CAP (community acquired pneumonia) 02/19/2011  . Coronary atherosclerosis of native coronary artery 02/19/2011  . Dyslipidemia   . Morbid obesity (HCC)   . Diabetes mellitus type 2 in obese (HCC)   . HTN (hypertension), benign    PCP:  Pcp, No Pharmacy:   [facility] [address] [place] - [address] [place] [address] Phone: [phone] Fax: [phone]  Readmission Risk Interventions No flowsheet data found.

## 2019-12-06 NOTE — ED Notes (Signed)
ED TO INPATIENT HANDOFF REPORT  ED Nurse Name and Phone #: [phone]  S Name/Age/Gender Nicole [name] 54 y.o. female Room/Bed: APAH1/APAH1  Code Status   Code Status: Full Code  Home/SNF/Other Home Patient oriented to: self, place, time and situation Is this baseline? Yes   Triage Complete: Triage complete  Chief Complaint Diabetic ulcer of left foot (HCC) [Y92.446, L97.529]  Triage Note Pt to er, pt states that she is here for a wound on her foot, states that she has had the wound for the past two weeks, states that it started as a blister that popped and now it isn't healing.      Allergies Allergies  Allergen Reactions  . Aspirin Anaphylaxis  . Bee Venom Anaphylaxis  . Food Anaphylaxis    PINEAPPLE  . Influenza Vaccines Anaphylaxis  . E-Mycin [Erythromycin Base] Nausea And Vomiting    Level of Care/Admitting Diagnosis ED Disposition    ED Disposition Condition Comment   Admit  Hospital Area: [facility] [place] [[id number]]  Level of Care: Med-Surg [16]  Covid Evaluation: Asymptomatic Screening Protocol (No Symptoms)  Diagnosis: Diabetic ulcer of left foot [facility]) [[id number]]  Admitting Physician: Chiquita Loth  Attending Physician: Randol Kern, DAWOOD S [[id number]]  Estimated length of stay: 3 - 4 days  Certification:: I certify this patient will need inpatient services for at least 2 midnights       B Medical/Surgery History Past Medical History:  Diagnosis Date  . CAP (community acquired pneumonia)    Streptococcus 01/2011  . COPD (chronic obstructive pulmonary disease) (HCC)   . Coronary atherosclerosis of native coronary artery    Diagnosed [place] 2006 - DES RCA, reports followup cath 2009 at [facility]   . Dyslipidemia   . Essential hypertension, benign   . Morbid obesity (HCC)   . Pancreatitis   . Type 2 diabetes mellitus (HCC)    Past Surgical History:  Procedure Laterality Date  . ABDOMINAL HERNIA REPAIR    . ABDOMINAL HYSTERECTOMY     . CORONARY ANGIOPLASTY WITH STENT PLACEMENT  2006  . KNEE SURGERY    . NOSE SURGERY    . Pilonidal cystectomy    . TONSILLECTOMY       A IV Location/Drains/Wounds Patient Lines/Drains/Airways Status    Active Line/Drains/Airways    Name Placement date Placement time Site Days   Peripheral IV 12/05/19 Right Antecubital 12/05/19  1830  Antecubital  1   Peripheral IV 12/05/19 Left Antecubital 12/05/19  1831  Antecubital  1          Intake/Output Last 24 hours  Intake/Output Summary (Last 24 hours) at 12/06/2019 1242 Last data filed at 12/06/2019 0148 Gross per 24 hour  Intake 1042.21 ml  Output --  Net 1042.21 ml    Labs/Imaging Results for orders placed or performed during the hospital encounter of 12/05/19 (from the past 48 hour(s))  Comprehensive metabolic panel     Status: Abnormal   Collection Time: 12/05/19  6:35 PM  Result Value Ref Range   Sodium 137 135 - 145 mmol/L   Potassium 3.7 3.5 - 5.1 mmol/L   Chloride 94 (L) 98 - 111 mmol/L   CO2 32 22 - 32 mmol/L   Glucose, Bld 324 (H) 70 - 99 mg/dL    Comment: Glucose reference range applies only to samples taken after fasting for at least 8 hours.   BUN 16 6 - 20 mg/dL   Creatinine, Ser 7.71 0.44 - 1.00 mg/dL   Calcium  8.8 (L) 8.9 - 10.3 mg/dL   Total Protein 7.1 6.5 - 8.1 g/dL   Albumin 3.3 (L) 3.5 - 5.0 g/dL   AST 22 15 - 41 U/L   ALT 18 0 - 44 U/L   Alkaline Phosphatase 87 38 - 126 U/L   Total Bilirubin 0.5 0.3 - 1.2 mg/dL   GFR, Estimated >29 >79 mL/min   Anion gap 11 5 - 15    Comment: Performed at [facility], [address] [place], [place] [address]  CBC with Differential     Status: Abnormal   Collection Time: 12/05/19  6:35 PM  Result Value Ref Range   WBC 14.7 (H) 4.0 - 10.5 K/uL   RBC 4.11 3.87 - 5.11 MIL/uL   Hemoglobin 12.7 12.0 - 15.0 g/dL   HCT 94.1 36 - 46 %   MCV 95.6 80.0 - 100.0 fL   MCH 30.9 26.0 - 34.0 pg   MCHC 32.3 30.0 - 36.0 g/dL   RDW 74.0 81.4 - 48.1 %   Platelets 244 150 -  400 K/uL   nRBC 0.0 0.0 - 0.2 %   Neutrophils Relative % 63 %   Neutro Abs 9.6 (H) 1.7 - 7.7 K/uL   Lymphocytes Relative 25 %   Lymphs Abs 3.6 0.7 - 4.0 K/uL   Monocytes Relative 7 %   Monocytes Absolute 1.0 0.1 - 1.0 K/uL   Eosinophils Relative 3 %   Eosinophils Absolute 0.4 0.0 - 0.5 K/uL   Basophils Relative 1 %   Basophils Absolute 0.1 0.0 - 0.1 K/uL   Immature Granulocytes 1 %   Abs Immature Granulocytes 0.08 (H) 0.00 - 0.07 K/uL    Comment: Performed at [facility], [address], [place], [place] [address]  Lactic acid, plasma     Status: Abnormal   Collection Time: 12/05/19  6:35 PM  Result Value Ref Range   Lactic Acid, Venous 2.8 (HH) 0.5 - 1.9 mmol/L    Comment: CRITICAL RESULT CALLED TO, READ BACK BY AND VERIFIED WITH: SITES,K ON 12/05/19 AT 1940 BY LOY,C Performed at [facility], [address] [place], [place] [address]   Blood culture (routine x 2)     Status: None (Preliminary result)   Collection Time: 12/05/19  6:36 PM   Specimen: Right Antecubital; Blood  Result Value Ref Range   Specimen Description RIGHT ANTECUBITAL    Special Requests      BOTTLES DRAWN AEROBIC AND ANAEROBIC Blood Culture adequate volume   Culture      NO GROWTH < 12 HOURS Performed at [facility], [address] [place], [place] [address]    Report Status PENDING   Blood culture (routine x 2)     Status: None (Preliminary result)   Collection Time: 12/05/19  6:36 PM   Specimen: Left Antecubital; Blood  Result Value Ref Range   Specimen Description LEFT ANTECUBITAL    Special Requests      BOTTLES DRAWN AEROBIC AND ANAEROBIC Blood Culture adequate volume   Culture      NO GROWTH < 12 HOURS Performed at [facility], [address] [place], [place] [address]    Report Status PENDING   Lactic acid, plasma     Status: Abnormal   Collection Time: 12/05/19  8:07 PM  Result Value Ref Range   Lactic Acid, Venous 2.7 (HH) 0.5 - 1.9 mmol/L    Comment: CRITICAL VALUE NOTED.  VALUE IS  CONSISTENT WITH PREVIOUSLY REPORTED AND CALLED VALUE. Performed  at [facility], [address] [place], [place] [address]   Sedimentation rate     Status: Abnormal   Collection Time: 12/05/19  8:07 PM  Result Value Ref Range   Sed Rate 67 (H) 0 - 22 mm/hr    Comment: Performed at [facility], [address] [place], [place] [address]  C-reactive protein     Status: Abnormal   Collection Time: 12/05/19  8:07 PM  Result Value Ref Range   CRP 8.7 (H) <1.0 mg/dL    Comment: Performed at [facility], [address] [place], [place] [address]  Prealbumin     Status: Abnormal   Collection Time: 12/05/19  8:07 PM  Result Value Ref Range   Prealbumin 17.9 (L) 18 - 38 mg/dL    Comment: Performed at [facility], [address] [place], [place] [address]  Respiratory Panel by RT PCR (Flu A&B, Covid) - Nasopharyngeal Swab     Status: None   Collection Time: 12/05/19  8:37 PM   Specimen: Nasopharyngeal Swab  Result Value Ref Range   SARS Coronavirus 2 by RT PCR NEGATIVE NEGATIVE    Comment: (NOTE) SARS-CoV-2 target nucleic acids are NOT DETECTED.  The SARS-CoV-2 RNA is generally detectable in upper respiratoy specimens during the acute phase of infection. The lowest concentration of SARS-CoV-2 viral copies this assay can detect is 131 copies/mL. A negative result does not preclude SARS-Cov-2 infection and should not be used as the sole basis for treatment or other patient management decisions. A negative result may occur with  improper specimen collection/handling, submission of specimen other than nasopharyngeal swab, presence of viral mutation(s) within the areas targeted by this assay, and inadequate number of viral copies (<131 copies/mL). A negative result must be combined with clinical observations, patient history, and epidemiological information. The expected result is Negative.  Fact Sheet for Patients:  https://www.moore.com/https://www.fda.gov/media/142436/download  Fact Sheet for  Healthcare Providers:  https://www.young.biz/https://www.fda.gov/media/142435/download  This test is no t yet approved or cleared by the [place] FDA and  has been authorized for detection and/or diagnosis of SARS-CoV-2 by FDA under an Emergency Use Authorization (EUA). This EUA will remain  in effect (meaning this test can be used) for the duration of the COVID-19 declaration under Section 564(b)(1) of the Act, 21 U.S.C. section 360bbb-3(b)(1), unless the authorization is terminated or revoked sooner.     Influenza A by PCR NEGATIVE NEGATIVE   Influenza B by PCR NEGATIVE NEGATIVE    Comment: (NOTE) The Xpert Xpress SARS-CoV-2/FLU/RSV assay is intended as an aid in  the diagnosis of influenza from Nasopharyngeal swab specimens and  should not be used as a sole basis for treatment. Nasal washings and  aspirates are unacceptable for Xpert Xpress SARS-CoV-2/FLU/RSV  testing.  Fact Sheet for Patients: https://www.moore.com/https://www.fda.gov/media/142436/download  Fact Sheet for Healthcare Providers: https://www.young.biz/https://www.fda.gov/media/142435/download  This test is not yet approved or cleared by the [place] FDA and  has been authorized for detection and/or diagnosis of SARS-CoV-2 by  FDA under an Emergency Use Authorization (EUA). This EUA will remain  in effect (meaning this test can be used) for the duration of the  Covid-19 declaration under Section 564(b)(1) of the Act, 21  U.S.C. section 360bbb-3(b)(1), unless the authorization is  terminated or revoked. Performed at [facility], [address] [place], [place] [address]   CBG monitoring, ED     Status: Abnormal   Collection Time: 12/05/19 10:06 PM  Result Value Ref Range   Glucose-Capillary 244 (H) 70 -  99 mg/dL    Comment: Glucose reference range applies only to samples taken after fasting for at least 8 hours.  CBC     Status: Abnormal   Collection Time: 12/06/19  6:14 AM  Result Value Ref Range   WBC 13.0 (H) 4.0 - 10.5 K/uL   RBC 3.87 3.87 - 5.11 MIL/uL    Hemoglobin 11.8 (L) 12.0 - 15.0 g/dL   HCT 11.9 36 - 46 %   MCV 95.1 80.0 - 100.0 fL   MCH 30.5 26.0 - 34.0 pg   MCHC 32.1 30.0 - 36.0 g/dL   RDW 41.7 40.8 - 14.4 %   Platelets 207 150 - 400 K/uL   nRBC 0.0 0.0 - 0.2 %    Comment: Performed at [facility], [address] [place], [place] [address]  Comprehensive metabolic panel     Status: Abnormal   Collection Time: 12/06/19  6:14 AM  Result Value Ref Range   Sodium 136 135 - 145 mmol/L   Potassium 3.7 3.5 - 5.1 mmol/L   Chloride 97 (L) 98 - 111 mmol/L   CO2 27 22 - 32 mmol/L   Glucose, Bld 298 (H) 70 - 99 mg/dL    Comment: Glucose reference range applies only to samples taken after fasting for at least 8 hours.   BUN 19 6 - 20 mg/dL   Creatinine, Ser 3.14 0.44 - 1.00 mg/dL   Calcium 8.1 (L) 8.9 - 10.3 mg/dL   Total Protein 6.3 (L) 6.5 - 8.1 g/dL   Albumin 2.9 (L) 3.5 - 5.0 g/dL   AST 25 15 - 41 U/L   ALT 16 0 - 44 U/L   Alkaline Phosphatase 80 38 - 126 U/L   Total Bilirubin 0.4 0.3 - 1.2 mg/dL   GFR, Estimated >97 >02 mL/min   Anion gap 12 5 - 15    Comment: Performed at [facility], [address], [place], [place] [address]  Urinalysis, Routine w reflex microscopic Urine, Clean Catch     Status: Abnormal   Collection Time: 12/06/19  7:35 AM  Result Value Ref Range   Color, Urine YELLOW YELLOW   APPearance CLEAR CLEAR   Specific Gravity, Urine 1.013 1.005 - 1.030   pH 5.0 5.0 - 8.0   Glucose, UA 50 (A) NEGATIVE mg/dL   Hgb urine dipstick NEGATIVE NEGATIVE   Bilirubin Urine NEGATIVE NEGATIVE   Ketones, ur NEGATIVE NEGATIVE mg/dL   Protein, ur 885 (A) NEGATIVE mg/dL   Nitrite NEGATIVE NEGATIVE   Leukocytes,Ua NEGATIVE NEGATIVE   RBC / HPF 0-5 0 - 5 RBC/hpf   WBC, UA 0-5 0 - 5 WBC/hpf   Bacteria, UA RARE (A) NONE SEEN   Squamous Epithelial / LPF 0-5 0 - 5   Uric Acid Crys, UA PRESENT     Comment: Performed at [facility], [address] [place], [place] [address]  CBG monitoring, ED     Status: Abnormal    Collection Time: 12/06/19  7:38 AM  Result Value Ref Range   Glucose-Capillary 274 (H) 70 - 99 mg/dL    Comment: Glucose reference range applies only to samples taken after fasting for at least 8 hours.   Comment 1 Notify RN   CBG monitoring, ED     Status: Abnormal   Collection Time: 12/06/19 11:26 AM  Result Value Ref Range   Glucose-Capillary 315 (H) 70 - 99 mg/dL    Comment: Glucose reference range applies only to samples taken after fasting for  at least 8 hours.   Comment 1 Notify RN    MR FOOT LEFT WO CONTRAST  Result Date: 12/06/2019 CLINICAL DATA:  Foot ulcer with swelling, diabetes. EXAM: MRI OF THE LEFT FOOT WITHOUT CONTRAST TECHNIQUE: Multiplanar, multisequence MR imaging of the left forefoot was performed. No intravenous contrast was administered. COMPARISON:  Radiographs 12/05/2019 FINDINGS: Despite efforts by the technologist and patient, motion artifact is present on today's exam and could not be eliminated. This reduces exam sensitivity and specificity. Fat saturation failed on the coronal T2 weighted images despite repeats. Bones/Joint/Cartilage Prior amputation of the small toe at the MTP joint. Low-grade edema signal in the base of the third and second metatarsals and in the adjacent kidney a forms, thought to be degenerative given the associated spurring. No malalignment at the Lisfranc joint. There also degenerative findings between the middle and lateral cuneiforms. No osteomyelitis is identified. Ligaments The Lisfranc ligament appears intact. Muscles and Tendons Low-level edema within along the atrophic plantar musculature of the foot, probably reflecting low-grade denervation edema, less likely to be from myositis. Soft tissues Cutaneous ulceration along the plantar-medial margin of the medial ball of the foot just distal to the level of the first MTP joint. In addition to the cutaneous defect shown on image 19 of series 5, there is localized abnormal inflammation in the  adjacent subcutaneous tissues along with cutaneous thickening in the region, compatible with cellulitis. No osseous edema or bony destructive findings to indicate osteomyelitis. Edema tracks in the dorsum of the foot, cellulitis is not excluded. IMPRESSION: 1. Cutaneous ulceration along the plantar-medial margin of the medial ball of the foot just distal to the level of the first MTP joint. Adjacent cellulitis is present and may be tracking along the dorsum of the foot. No drainable abscess, osteomyelitis, or gas tracking in the soft tissues observed. 2. Low-level edema within along the atrophic plantar musculature of the foot, probably reflecting low-grade denervation edema, less likely to be from myositis. 3. Edema tracks in the dorsum of the foot, cellulitis is not excluded. 4. Degenerative findings in the middle and lateral cuneiforms and in the bases of the second and third metatarsals. 5. Prior amputation of the small toe at the MTP joint. Electronically Signed   By: Gaylyn Rong M.D.   On: 12/06/2019 08:33   DG Foot Complete Left  Result Date: 12/05/2019 CLINICAL DATA:  Diabetic ulceration EXAM: LEFT FOOT - COMPLETE 3+ VIEW COMPARISON:  November 07, 2009 FINDINGS: Frontal, oblique, and lateral views were obtained. Patient has had amputation at the level of the fifth MTP joint. Cortical remodeling is noted along the distal aspect of the fifth metatarsal. There is no acute fracture or dislocation. No erosive change or bony destruction. There are spurs arising from the posterior and inferior calcaneus. There is diffuse soft tissue swelling in the mid and forefoot regions. There is soft tissue ulceration medial to the first MTP joint. No associated radiopaque foreign body. An os naviculare is noted incidentally, an anatomic variant. IMPRESSION: 1. Soft tissue ulceration medial to the first MTP joint. No associated bony destruction or erosion in this area. 2. Status post amputation at the fifth MTP  joint level with remodeling in the distal fifth metatarsal. 3. No fracture or dislocation. No appreciable joint space narrowing. 4.  Diffuse soft tissue swelling in the mid and forefoot regions. 5.  Calcaneal spurs noted. Electronically Signed   By: Bretta Bang III M.D.   On: 12/05/2019 18:21    Pending Labs  Unresulted Labs (From admission, onward)          Start     Ordered   12/06/19 0500  HIV Antibody (routine testing w rflx)  (HIV Antibody (Routine testing w reflex) panel)  Tomorrow morning,   R        12/05/19 2106          Vitals/Pain Today's Vitals   12/06/19 0400 12/06/19 0500 12/06/19 0600 12/06/19 0800  BP: (!) 126/33 (!) 121/52 (!) 153/68 107/62  Pulse: 62 64 69 63  Resp: Temp:      TempSrc:      SpO2: 94% 95% 93% 98%  Weight:      Height:      PainSc:        Isolation Precautions No active isolations  Medications Medications  pantoprazole (PROTONIX) EC tablet 40 mg (40 mg Oral Given 12/06/19 1023)  clopidogrel (PLAVIX) tablet 75 mg (75 mg Oral Given 12/06/19 1023)  albuterol (PROVENTIL) (2.5 MG/3ML) 0.083% nebulizer solution 2.5 mg (has no administration in time range)  cefTRIAXone (ROCEPHIN) 2 g in sodium chloride 0.9 % 100 mL IVPB (0 g Intravenous Stopped 12/05/19 2332)    And  metroNIDAZOLE (FLAGYL) IVPB 500 mg (0 mg Intravenous Stopped 12/06/19 0543)  acetaminophen (TYLENOL) tablet 650 mg (650 mg Oral Given 12/05/19 2231)    Or  acetaminophen (TYLENOL) suppository 650 mg ( Rectal See Alternative 12/05/19 2231)  nicotine (NICODERM CQ - dosed in mg/24 hours) patch 21 mg (21 mg Transdermal Patch Applied 12/06/19 1024)  insulin aspart (novoLOG) injection 0-20 Units (11 Units Subcutaneous Given 12/06/19 0925)  insulin detemir (LEVEMIR) injection 75 Units (75 Units Subcutaneous Given 12/05/19 2232)  insulin aspart (novoLOG) injection 0-5 Units (2 Units Subcutaneous Given 12/05/19 2206)  apixaban (ELIQUIS) tablet 5 mg (5 mg Oral Given 12/06/19  1023)  metoprolol tartrate (LOPRESSOR) tablet 50 mg (50 mg Oral Given 12/06/19 1024)  simvastatin (ZOCOR) tablet 40 mg (has no administration in time range)  lisinopril (ZESTRIL) tablet 2.5 mg (2.5 mg Oral Given 12/06/19 1023)  mupirocin ointment (BACTROBAN) 2 % ( Topical Given 12/06/19 1024)  piperacillin-tazobactam (ZOSYN) IVPB 3.375 g (0 g Intravenous Stopped 12/05/19 2030)  sodium chloride 0.9 % bolus 1,000 mL (0 mLs Intravenous Stopped 12/06/19 0148)    Mobility walks with device Low fall risk   Focused Assessments    R Recommendations: See Admitting Provider Note  Report given to:   Additional Notes:

## 2019-12-07 ENCOUNTER — Inpatient Hospital Stay (HOSPITAL_COMMUNITY): Payer: Medicare (Managed Care)

## 2019-12-07 DIAGNOSIS — E11621 Type 2 diabetes mellitus with foot ulcer: Secondary | ICD-10-CM | POA: Diagnosis not present

## 2019-12-07 DIAGNOSIS — E08621 Diabetes mellitus due to underlying condition with foot ulcer: Secondary | ICD-10-CM | POA: Diagnosis not present

## 2019-12-07 DIAGNOSIS — L97422 Non-pressure chronic ulcer of left heel and midfoot with fat layer exposed: Secondary | ICD-10-CM | POA: Diagnosis not present

## 2019-12-07 DIAGNOSIS — J449 Chronic obstructive pulmonary disease, unspecified: Secondary | ICD-10-CM | POA: Diagnosis not present

## 2019-12-07 DIAGNOSIS — L03116 Cellulitis of left lower limb: Secondary | ICD-10-CM | POA: Diagnosis not present

## 2019-12-07 DIAGNOSIS — E1169 Type 2 diabetes mellitus with other specified complication: Secondary | ICD-10-CM | POA: Diagnosis not present

## 2019-12-07 LAB — CBC
HCT: 35.4 % — ABNORMAL LOW (ref 36.0–46.0)
Hemoglobin: 11.2 g/dL — ABNORMAL LOW (ref 12.0–15.0)
MCH: 30.8 pg (ref 26.0–34.0)
MCHC: 31.6 g/dL (ref 30.0–36.0)
MCV: 97.3 fL (ref 80.0–100.0)
Platelets: 218 10*3/uL (ref 150–400)
RBC: 3.64 MIL/uL — ABNORMAL LOW (ref 3.87–5.11)
RDW: 14.3 % (ref 11.5–15.5)
WBC: 12 10*3/uL — ABNORMAL HIGH (ref 4.0–10.5)
nRBC: 0 % (ref 0.0–0.2)

## 2019-12-07 LAB — GLUCOSE, CAPILLARY
Glucose-Capillary: 275 mg/dL — ABNORMAL HIGH (ref 70–99)
Glucose-Capillary: 259 mg/dL — ABNORMAL HIGH (ref 70–99)
Glucose-Capillary: 252 mg/dL — ABNORMAL HIGH (ref 70–99)
Glucose-Capillary: 310 mg/dL — ABNORMAL HIGH (ref 70–99)

## 2019-12-07 MED ORDER — HYDROCODONE-ACETAMINOPHEN 5-325 MG PO TABS
1.0000 | ORAL_TABLET | Freq: Four times a day (QID) | ORAL | Status: DC | PRN
  Administered 2019-12-07 – 2019-12-09 (×6): 1 via ORAL
  Filled 2019-12-07 (×6): qty 1

## 2019-12-07 MED ORDER — FUROSEMIDE 40 MG PO TABS
40.0000 mg | ORAL_TABLET | Freq: Two times a day (BID) | ORAL | Status: DC
  Administered 2019-12-07 – 2019-12-08 (×3): 40 mg via ORAL
  Filled 2019-12-07 (×3): qty 1

## 2019-12-07 MED ORDER — DULOXETINE HCL 60 MG PO CPEP
60.0000 mg | ORAL_CAPSULE | Freq: Two times a day (BID) | ORAL | Status: DC
  Administered 2019-12-07 – 2019-12-10 (×7): 60 mg via ORAL
  Filled 2019-12-07 (×7): qty 1

## 2019-12-07 MED ORDER — GLUCERNA SHAKE PO LIQD
237.0000 mL | Freq: Two times a day (BID) | ORAL | Status: DC
  Administered 2019-12-07 – 2019-12-10 (×5): 237 mL via ORAL

## 2019-12-07 MED ORDER — INSULIN DETEMIR 100 UNIT/ML ~~LOC~~ SOLN
80.0000 [IU] | Freq: Two times a day (BID) | SUBCUTANEOUS | Status: DC
  Administered 2019-12-07 – 2019-12-09 (×5): 80 [IU] via SUBCUTANEOUS
  Administered 2019-12-10: 60 [IU] via SUBCUTANEOUS
  Filled 2019-12-07 (×8): qty 0.8

## 2019-12-07 MED ORDER — BUPROPION HCL ER (SR) 100 MG PO TB12
100.0000 mg | ORAL_TABLET | Freq: Two times a day (BID) | ORAL | Status: DC
  Administered 2019-12-07 – 2019-12-10 (×7): 100 mg via ORAL
  Filled 2019-12-07 (×7): qty 1

## 2019-12-07 MED ORDER — INFLUENZA VAC SPLIT QUAD 0.5 ML IM SUSY
0.5000 mL | PREFILLED_SYRINGE | INTRAMUSCULAR | Status: DC
  Filled 2019-12-07: qty 0.5

## 2019-12-07 MED ORDER — ADULT MULTIVITAMIN W/MINERALS CH
1.0000 | ORAL_TABLET | Freq: Every day | ORAL | Status: DC
  Administered 2019-12-07 – 2019-12-10 (×4): 1 via ORAL
  Filled 2019-12-07 (×4): qty 1

## 2019-12-07 MED ORDER — JUVEN PO PACK
1.0000 | PACK | Freq: Two times a day (BID) | ORAL | Status: DC
  Administered 2019-12-07 – 2019-12-10 (×5): 1 via ORAL
  Filled 2019-12-07 (×5): qty 1

## 2019-12-07 MED ORDER — ENSURE MAX PROTEIN PO LIQD
11.0000 [oz_av] | Freq: Every day | ORAL | Status: DC
  Administered 2019-12-07 – 2019-12-10 (×4): 11 [oz_av] via ORAL
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
## 2019-12-07 NOTE — Care Management Important Message (Signed)
Important Message  Patient Details  Name: Nicole [name] MRN: [id number] Date of Birth: [birth year]   Medicare Important Message Given:  Yes     Corey Harold 12/07/2019, 4:29 PM

## 2019-12-07 NOTE — Progress Notes (Signed)
Inpatient Diabetes Program Recommendations  AACE/ADA: New Consensus Statement on Inpatient Glycemic Control  Target Ranges:  Prepandial:   less than 140 mg/dL      Peak postprandial:   less than 180 mg/dL (1-2 hours)      Critically ill patients:  140 - 180 mg/dL   Results for FEVEN, ALDERFER (MRN [id number]) as of 12/07/2019 08:01  Ref. Range 12/06/2019 07:38 12/06/2019 11:26 12/06/2019 16:33 12/06/2019 20:32 12/07/2019 07:30  Glucose-Capillary Latest Ref Range: 70 - 99 mg/dL 007 (H) 121 (H) 975 (H) 275 (H) 275 (H)   Review of Glycemic Control  Diabetes history: DM2 Outpatient Diabetes medications: Glipizide 10 mg BID, Levemir 100 units BID, Novolog 60 units TID with meals, Regular per correction scale Current orders for Inpatient glycemic control: Levemir 75 units BID, Novolog 0-20 units TID with meals, Novolog 0-5 units QHS  Inpatient Diabetes Program Recommendations:    Insulin: Please consider increasing Levemir to 80 units BID and ordering Novolog 8 units TID with meals for meal coverage if patient eats at least 50% of meals.  HbgA1C: Please consider ordering an A1C to evaluate glycemic control over the past 2-3 months.  Thanks, Orlando Penner, RN, MSN, CDE Diabetes Coordinator Inpatient Diabetes Program [phone] (Team Pager from 8am to 5pm)

## 2019-12-07 NOTE — Progress Notes (Signed)
PROGRESS NOTE    Nicole [name]  FAO:[id number] DOB: 10-13-1965 DOA: 12/05/2019 PCP: Pcp, No    Brief Narrative:  Nicole [name]  is a 54 y.o. female, with past medical history of COPD, hyperlipidemia, hypertension, morbid obesity, type 2 diabetes mellitus insulin-dependent, CAD s/p PCI, chronic diastolic CHF, A. fib on Eliquis, OSA on CPAP, patient presents to ED for wound check, patient reports wound in the left foot for last 2 weeks, initially started at the blister, for the last 3 days did progress where it had some foul-smelling odor and discharge, as well she reports her blood sugar was uncontrolled, she denies fever, chills, nausea or vomiting. -In ED lactic acid was 2.8, white blood cells was elevated at 14.7 K, blood glucose was elevated at 324, x-ray with no evidence of osteomyelitis, but she has significant wound for which she was started on Zosyn, and [facility] consulted to admit   Assessment & Plan:   Active Problems:   Coronary atherosclerosis of native coronary artery   Morbid obesity (HCC)   Diabetes mellitus type 2 in obese (HCC)   HTN (hypertension), benign   COPD (chronic obstructive pulmonary disease) (HCC)   TIA (transient ischemic attack)   Diabetic ulcer of left foot (HCC)   Left foot diabetic ulcer with associated cellulitis -MRI negative for underlying osteo or abscess -Appreciate general surgery input.  No indication for debridement at this time -Appreciate WOC recommendations -Continue on IV antibiotics since she still has swelling, warmth and erythema -Checking ABIs  Atrial fibrillation -Heart rate currently stable on beta-blockers -Anticoagulated with Eliquis  History of coronary artery disease status post stents in the past -No complaints of chest pain at this time -Continue on Plavix, statin, beta-blockers and lisinopril  Hypertension -Blood pressure stable on current medications  Chronic diastolic congestive heart failure. -Ejection  fraction of 50% in 03/2019 -Since she had elevated lactic acid on admission, holding Lasix for now  COPD -No wheezing at this time -Continue bronchodilators as needed  Morbid obesity. -Counseled on importance of diet and exercise  Obstructive sleep apnea -Continue on CPAP   DVT prophylaxis:  apixaban (ELIQUIS) tablet 5 mg  Code Status: Full code Family Communication: Discussed with patient Disposition Plan: Status is: Inpatient  Remains inpatient appropriate because:IV treatments appropriate due to intensity of illness or inability to take PO   Dispo: The patient is from: Home              Anticipated d/c is to: Home              Anticipated d/c date is: 1 day              Patient currently is not medically stable to d/c.    Consultants:   General surgery  Procedures:     Antimicrobials:   Ceftriaxone 10/13 >  Flagyl 10/13 >   Subjective: Continues to have pain in her left foot.  Continues to have swelling and it feels hot.  Objective: Vitals:   12/06/19 1711 12/06/19 2118 12/06/19 2134 12/07/19 1404  BP: (!) 108/57  (!) 110/46 (!) 140/57  Pulse: 64  68 63  Resp: Temp:   97.9 F (36.6 C) 98 F (36.7 C)  TempSrc:   Oral   SpO2: 100% 93% 92% 99%  Weight:      Height:        Intake/Output Summary (Last 24 hours) at 12/07/2019 1821 Last data filed at 12/07/2019 1300 Gross  per 24 hour  Intake 680 ml  Output --  Net 680 ml   Filed Weights   12/05/19 1612  Weight: 131.1 kg    Examination:  General exam: Alert, awake, oriented x 3 Respiratory system: Clear to auscultation. Respiratory effort normal. Cardiovascular system:RRR. No murmurs, rubs, gallops. Gastrointestinal system: Abdomen is nondistended, soft and nontender. No organomegaly or masses felt. Normal bowel sounds heard. Central nervous system: Alert and oriented. No focal neurological deficits. Extremities: No C/C/E, +pedal pulses Skin: Erythema over left foot persists with  significant edema and warmth Psychiatry: Judgement and insight appear normal. Mood & affect appropriate.    Data Reviewed: I have personally reviewed following labs and imaging studies  CBC: Recent Labs  Lab 12/05/19 1835 12/06/19 0614 12/07/19 0508  WBC 14.7* 13.0* 12.0*  NEUTROABS 9.6*  --   --   HGB 12.7 11.8* 11.2*  HCT 39.3 36.8 35.4*  MCV 95.6 95.1 97.3  PLT 244 207 218   Basic Metabolic Panel: Recent Labs  Lab 12/05/19 1835 12/06/19 0614  NA 137 136  K 3.7 3.7  CL 94* 97*  CO2 32 27  GLUCOSE 324* 298*  BUN 16 19  CREATININE 0.89 0.89  CALCIUM 8.8* 8.1*   GFR: Estimated Creatinine Clearance: 94.6 mL/min (by C-G formula based on SCr of 0.89 mg/dL). Liver Function Tests: Recent Labs  Lab 12/05/19 1835 12/06/19 0614  AST 22 25  ALT 18 16  ALKPHOS 87 80  BILITOT 0.5 0.4  PROT 7.1 6.3*  ALBUMIN 3.3* 2.9*   No results for input(s): LIPASE, AMYLASE in the last 168 hours. No results for input(s): AMMONIA in the last 168 hours. Coagulation Profile: No results for input(s): INR, PROTIME in the last 168 hours. Cardiac Enzymes: No results for input(s): CKTOTAL, CKMB, CKMBINDEX, TROPONINI in the last 168 hours. BNP (last 3 results) No results for input(s): PROBNP in the last 8760 hours. HbA1C: No results for input(s): HGBA1C in the last 72 hours. CBG: Recent Labs  Lab 12/06/19 1633 12/06/19 2032 12/07/19 0730 12/07/19 1121 12/07/19 1644  GLUCAP 206* 275* 275* 259* 252*   Lipid Profile: No results for input(s): CHOL, HDL, LDLCALC, TRIG, CHOLHDL, LDLDIRECT in the last 72 hours. Thyroid Function Tests: No results for input(s): TSH, T4TOTAL, FREET4, T3FREE, THYROIDAB in the last 72 hours. Anemia Panel: No results for input(s): VITAMINB12, FOLATE, FERRITIN, TIBC, IRON, RETICCTPCT in the last 72 hours. Sepsis Labs: Recent Labs  Lab 12/05/19 1835 12/05/19 2007  LATICACIDVEN 2.8* 2.7*    Recent Results (from the past 240 hour(s))  Blood culture  (routine x 2)     Status: None (Preliminary result)   Collection Time: 12/05/19  6:36 PM   Specimen: Right Antecubital; Blood  Result Value Ref Range Status   Specimen Description RIGHT ANTECUBITAL  Final   Special Requests   Final    BOTTLES DRAWN AEROBIC AND ANAEROBIC Blood Culture adequate volume   Culture   Final    NO GROWTH 2 DAYS Performed at [facility], [address] [place], [place] [address]    Report Status PENDING  Incomplete  Blood culture (routine x 2)     Status: None (Preliminary result)   Collection Time: 12/05/19  6:36 PM   Specimen: Left Antecubital; Blood  Result Value Ref Range Status   Specimen Description LEFT ANTECUBITAL  Final   Special Requests   Final    BOTTLES DRAWN AEROBIC AND ANAEROBIC Blood Culture adequate volume   Culture   Final  NO GROWTH 2 DAYS Performed at [facility], [address] [place], [place] [address]    Report Status PENDING  Incomplete  Respiratory Panel by RT PCR (Flu A&B, Covid) - Nasopharyngeal Swab     Status: None   Collection Time: 12/05/19  8:37 PM   Specimen: Nasopharyngeal Swab  Result Value Ref Range Status   SARS Coronavirus 2 by RT PCR NEGATIVE NEGATIVE Final    Comment: (NOTE) SARS-CoV-2 target nucleic acids are NOT DETECTED.  The SARS-CoV-2 RNA is generally detectable in upper respiratoy specimens during the acute phase of infection. The lowest concentration of SARS-CoV-2 viral copies this assay can detect is 131 copies/mL. A negative result does not preclude SARS-Cov-2 infection and should not be used as the sole basis for treatment or other patient management decisions. A negative result may occur with  improper specimen collection/handling, submission of specimen other than nasopharyngeal swab, presence of viral mutation(s) within the areas targeted by this assay, and inadequate number of viral copies (<131 copies/mL). A negative result must be combined with clinical observations, patient history, and  epidemiological information. The expected result is Negative.  Fact Sheet for Patients:  https://www.moore.com/  Fact Sheet for Healthcare Providers:  https://www.young.biz/  This test is no t yet approved or cleared by the [place] FDA and  has been authorized for detection and/or diagnosis of SARS-CoV-2 by FDA under an Emergency Use Authorization (EUA). This EUA will remain  in effect (meaning this test can be used) for the duration of the COVID-19 declaration under Section 564(b)(1) of the Act, 21 U.S.C. section 360bbb-3(b)(1), unless the authorization is terminated or revoked sooner.     Influenza A by PCR NEGATIVE NEGATIVE Final   Influenza B by PCR NEGATIVE NEGATIVE Final    Comment: (NOTE) The Xpert Xpress SARS-CoV-2/FLU/RSV assay is intended as an aid in  the diagnosis of influenza from Nasopharyngeal swab specimens and  should not be used as a sole basis for treatment. Nasal washings and  aspirates are unacceptable for Xpert Xpress SARS-CoV-2/FLU/RSV  testing.  Fact Sheet for Patients: https://www.moore.com/  Fact Sheet for Healthcare Providers: https://www.young.biz/  This test is not yet approved or cleared by the [place] FDA and  has been authorized for detection and/or diagnosis of SARS-CoV-2 by  FDA under an Emergency Use Authorization (EUA). This EUA will remain  in effect (meaning this test can be used) for the duration of the  Covid-19 declaration under Section 564(b)(1) of the Act, 21  U.S.C. section 360bbb-3(b)(1), unless the authorization is  terminated or revoked. Performed at [facility], [address], [place], [place] [address]          Radiology Studies: MR FOOT LEFT WO CONTRAST  Result Date: 12/06/2019 CLINICAL DATA:  Foot ulcer with swelling, diabetes. EXAM: MRI OF THE LEFT FOOT WITHOUT CONTRAST TECHNIQUE: Multiplanar, multisequence MR imaging of the  left forefoot was performed. No intravenous contrast was administered. COMPARISON:  Radiographs 12/05/2019 FINDINGS: Despite efforts by the technologist and patient, motion artifact is present on today's exam and could not be eliminated. This reduces exam sensitivity and specificity. Fat saturation failed on the coronal T2 weighted images despite repeats. Bones/Joint/Cartilage Prior amputation of the small toe at the MTP joint. Low-grade edema signal in the base of the third and second metatarsals and in the adjacent kidney a forms, thought to be degenerative given the associated spurring. No malalignment at the Lisfranc joint. There also degenerative findings between the middle and lateral cuneiforms. No osteomyelitis is  identified. Ligaments The Lisfranc ligament appears intact. Muscles and Tendons Low-level edema within along the atrophic plantar musculature of the foot, probably reflecting low-grade denervation edema, less likely to be from myositis. Soft tissues Cutaneous ulceration along the plantar-medial margin of the medial ball of the foot just distal to the level of the first MTP joint. In addition to the cutaneous defect shown on image 19 of series 5, there is localized abnormal inflammation in the adjacent subcutaneous tissues along with cutaneous thickening in the region, compatible with cellulitis. No osseous edema or bony destructive findings to indicate osteomyelitis. Edema tracks in the dorsum of the foot, cellulitis is not excluded. IMPRESSION: 1. Cutaneous ulceration along the plantar-medial margin of the medial ball of the foot just distal to the level of the first MTP joint. Adjacent cellulitis is present and may be tracking along the dorsum of the foot. No drainable abscess, osteomyelitis, or gas tracking in the soft tissues observed. 2. Low-level edema within along the atrophic plantar musculature of the foot, probably reflecting low-grade denervation edema, less likely to be from myositis.  3. Edema tracks in the dorsum of the foot, cellulitis is not excluded. 4. Degenerative findings in the middle and lateral cuneiforms and in the bases of the second and third metatarsals. 5. Prior amputation of the small toe at the MTP joint. Electronically Signed   By: Gaylyn Rong M.D.   On: 12/06/2019 08:33   US ARTERIAL ABI (SCREENING LOWER EXTREMITY)  Result Date: 12/07/2019 CLINICAL DATA:  Left foot swelling, left foot plantar surface ulcer, obesity, previous left fifth toe amputation, smoker, hypertension, diabetes EXAM: NONINVASIVE PHYSIOLOGIC VASCULAR STUDY OF BILATERAL LOWER EXTREMITIES TECHNIQUE: Evaluation of both lower extremities were performed at rest, including calculation of ankle-brachial indices with single level Doppler, pressure and pulse volume recording. COMPARISON:  None. FINDINGS: Right ABI:  1.18 Left ABI:  1.14 Right Lower Extremity: Biphasic right posterior tibial and dorsalis pedis waveforms with normal amplitude Left Lower Extremity: Similar biphasic left posterior tibial and dorsalis pedis waveforms with normal amplitude. 1.0-1.4 Normal IMPRESSION: Normal ABIs at rest Electronically Signed   By: Judie Petit.  Shick M.D.   On: 12/07/2019 13:28        Scheduled Meds: . apixaban  5 mg Oral BID  . buPROPion  100 mg Oral BID  . clopidogrel  75 mg Oral Daily  . DULoxetine  60 mg Oral BID  . feeding supplement (GLUCERNA SHAKE)  237 mL Oral BID BM  . furosemide  40 mg Oral BID  . [START ON 12/08/2019] influenza vac split quadrivalent PF  0.5 mL Intramuscular Tomorrow-1000  . insulin aspart  0-20 Units Subcutaneous TID WC  . insulin aspart  0-5 Units Subcutaneous QHS  . insulin detemir  75 Units Subcutaneous BID  . lisinopril  2.5 mg Oral Daily  . metoprolol tartrate  50 mg Oral BID  . multivitamin with minerals  1 tablet Oral Daily  . mupirocin ointment   Topical Daily  . nicotine  21 mg Transdermal Daily  . nutrition supplement (JUVEN)  1 packet Oral BID BM  .  pantoprazole  40 mg Oral Daily  . pneumococcal 23 valent vaccine  0.5 mL Intramuscular Tomorrow-1000  . Ensure Max Protein  11 oz Oral Daily  . simvastatin  40 mg Oral q1800   Continuous Infusions: . cefTRIAXone (ROCEPHIN)  IV 2 g (12/06/19 2025)   And  . metronidazole 500 mg (12/07/19 1407)     LOS: 2 days    Time  spent:    Erick Blinks, MD [facility]   If 7PM-7AM, please contact night-coverage [email or web address]  12/07/2019, 6:21 PM

## 2019-12-08 DIAGNOSIS — E1169 Type 2 diabetes mellitus with other specified complication: Secondary | ICD-10-CM | POA: Diagnosis not present

## 2019-12-08 DIAGNOSIS — L03116 Cellulitis of left lower limb: Secondary | ICD-10-CM | POA: Diagnosis not present

## 2019-12-08 DIAGNOSIS — J449 Chronic obstructive pulmonary disease, unspecified: Secondary | ICD-10-CM | POA: Diagnosis not present

## 2019-12-08 DIAGNOSIS — E11621 Type 2 diabetes mellitus with foot ulcer: Secondary | ICD-10-CM | POA: Diagnosis not present

## 2019-12-08 LAB — CBC
HCT: 37.3 % (ref 36.0–46.0)
Hemoglobin: 11.2 g/dL — ABNORMAL LOW (ref 12.0–15.0)
MCH: 30.3 pg (ref 26.0–34.0)
MCHC: 30 g/dL (ref 30.0–36.0)
MCV: 100.8 fL — ABNORMAL HIGH (ref 80.0–100.0)
Platelets: 219 10*3/uL (ref 150–400)
RBC: 3.7 MIL/uL — ABNORMAL LOW (ref 3.87–5.11)
RDW: 14.2 % (ref 11.5–15.5)
WBC: 14.5 10*3/uL — ABNORMAL HIGH (ref 4.0–10.5)
nRBC: 0 % (ref 0.0–0.2)

## 2019-12-08 LAB — GLUCOSE, CAPILLARY
Glucose-Capillary: 256 mg/dL — ABNORMAL HIGH (ref 70–99)
Glucose-Capillary: 298 mg/dL — ABNORMAL HIGH (ref 70–99)
Glucose-Capillary: 258 mg/dL — ABNORMAL HIGH (ref 70–99)
Glucose-Capillary: 203 mg/dL — ABNORMAL HIGH (ref 70–99)

## 2019-12-08 LAB — BASIC METABOLIC PANEL
Anion gap: 8 (ref 5–15)
BUN: 26 mg/dL — ABNORMAL HIGH (ref 6–20)
CO2: 34 mmol/L — ABNORMAL HIGH (ref 22–32)
Calcium: 9.3 mg/dL (ref 8.9–10.3)
Chloride: 98 mmol/L (ref 98–111)
Creatinine, Ser: 0.87 mg/dL (ref 0.44–1.00)
GFR, Estimated: >60 mL/min (ref >60)
Glucose, Bld: 275 mg/dL — ABNORMAL HIGH (ref 70–99)
Potassium: 4.5 mmol/L (ref 3.5–5.1)
Sodium: 140 mmol/L (ref 135–145)

## 2019-12-08 MED ORDER — LACTATED RINGERS IV SOLN: INTRAVENOUS | Status: AC

## 2019-12-08 MED ORDER — DOXYCYCLINE HYCLATE 100 MG PO TABS
100.0000 mg | ORAL_TABLET | Freq: Two times a day (BID) | ORAL | Status: DC
  Administered 2019-12-08 – 2019-12-10 (×4): 100 mg via ORAL
  Filled 2019-12-08 (×4): qty 1

## 2019-12-08 MED ORDER — AMOXICILLIN-POT CLAVULANATE 875-125 MG PO TABS
1.0000 | ORAL_TABLET | Freq: Two times a day (BID) | ORAL | Status: DC
  Administered 2019-12-08 – 2019-12-10 (×4): 1 via ORAL
  Filled 2019-12-08 (×6): qty 1

## 2019-12-08 NOTE — Progress Notes (Signed)
PROGRESS NOTE    KIYRA SLAUBAUGH  FOY:[id number] DOB: 02/03/1966 DOA: 12/05/2019 PCP: Pcp, No    Brief Narrative:  Kabrea Seeney  is a 54 y.o. female, with past medical history of COPD, hyperlipidemia, hypertension, morbid obesity, type 2 diabetes mellitus insulin-dependent, CAD s/p PCI, chronic diastolic CHF, A. fib on Eliquis, OSA on CPAP, patient presents to ED for wound check, patient reports wound in the left foot for last 2 weeks, initially started at the blister, for the last 3 days did progress where it had some foul-smelling odor and discharge, as well she reports her blood sugar was uncontrolled, she denies fever, chills, nausea or vomiting. -In ED lactic acid was 2.8, white blood cells was elevated at 14.7 K, blood glucose was elevated at 324, x-ray with no evidence of osteomyelitis, but she has significant wound for which she was started on Zosyn, and [facility] hospitalist consulted to admit   Assessment & Plan:   Active Problems:   Coronary atherosclerosis of native coronary artery   Morbid obesity (HCC)   Diabetes mellitus type 2 in obese (HCC)   HTN (hypertension), benign   COPD (chronic obstructive pulmonary disease) (HCC)   TIA (transient ischemic attack)   Diabetic ulcer of left foot (HCC)   Left foot diabetic ulcer with associated cellulitis -MRI negative for underlying osteo or abscess -Appreciate general surgery input.  No indication for debridement at this time -Appreciate WOC recommendations -Overall cellulitis improved with IV antibiotics -We will transition to oral antibiotics -ABIs greater than 1 bilaterally  Diarrhea -Reports having 4 bowel movements overnight and also had an accident in her bed today. -She does continue to have a leukocytosis that has trended up. -We will check stool for C. difficile  Atrial fibrillation -Heart rate currently stable on beta-blockers -Anticoagulated with Eliquis  History of coronary artery disease status post stents in  the past -No complaints of chest pain at this time -Continue on Plavix, statin, beta-blockers and lisinopril  Hypertension -Blood pressure stable on current medications  Chronic diastolic congestive heart failure. -Ejection fraction of 50% in 03/2019 -Since she had elevated lactic acid on admission, holding Lasix for now  COPD -No wheezing at this time -Continue bronchodilators as needed  Morbid obesity. -Counseled on importance of diet and exercise  Obstructive sleep apnea -Continue on CPAP   DVT prophylaxis:  apixaban (ELIQUIS) tablet 5 mg  Code Status: Full code Family Communication: Discussed with patient Disposition Plan: Status is: Inpatient  Remains inpatient appropriate because:IV treatments appropriate due to intensity of illness or inability to take PO   Dispo: The patient is from: Home              Anticipated d/c is to: Home              Anticipated d/c date is: 1 day              Patient currently is not medically stable to d/c.    Consultants:   General surgery  Procedures:     Antimicrobials:   Ceftriaxone 10/13 > 10/16  Flagyl 10/13 > 10/16  augmentin 10/16>  Doxycycline 10/16>   Subjective: Overall foot pain is better.  She reports having multiple bowel movements overnight and today.  She feels tired.  Feels weak.  She complains of headache  Objective: Vitals:   12/07/19 2258 12/08/19 0440 12/08/19 0750 12/08/19 1402  BP:  (!) 136/42  (!) 140/54  Pulse: 73 65  63  Resp: 17 20  18  Temp:  98.8 F (37.1 C)  97.6 F (36.4 C)  TempSrc:    Oral  SpO2: 97% 96% 97% 97%  Weight:      Height:        Intake/Output Summary (Last 24 hours) at 12/08/2019 1937 Last data filed at 12/08/2019 1700 Gross per 24 hour  Intake 1236.94 ml  Output --  Net 1236.94 ml   Filed Weights   12/05/19 1612  Weight: 131.1 kg    Examination:  General exam: Alert, awake, oriented x 3 Respiratory system: Clear to auscultation. Respiratory effort  normal. Cardiovascular system:RRR. No murmurs, rubs, gallops. Gastrointestinal system: Abdomen is nondistended, soft and nontender. No organomegaly or masses felt. Normal bowel sounds heard. Central nervous system: Alert and oriented. No focal neurological deficits. Extremities: No C/C/E, +pedal pulses Skin: Wound on plantar aspect of left great toe, overall erythema is improving Psychiatry: Judgement and insight appear normal. Mood & affect appropriate.    Data Reviewed: I have personally reviewed following labs and imaging studies  CBC: Recent Labs  Lab 12/05/19 1835 12/06/19 0614 12/07/19 0508 12/08/19 0919  WBC 14.7* 13.0* 12.0* 14.5*  NEUTROABS 9.6*  --   --   --   HGB 12.7 11.8* 11.2* 11.2*  HCT 39.3 36.8 35.4* 37.3  MCV 95.6 95.1 97.3 100.8*  PLT 244 207 218 219   Basic Metabolic Panel: Recent Labs  Lab 12/05/19 1835 12/06/19 0614 12/08/19 0919  NA 137 136 140  K 3.7 3.7 4.5  CL 94* 97* 98  CO2 32 27 34*  GLUCOSE 324* 298* 275*  BUN 16 19 26*  CREATININE 0.89 0.89 0.87  CALCIUM 8.8* 8.1* 9.3   GFR: Estimated Creatinine Clearance: 96.8 mL/min (by C-G formula based on SCr of 0.87 mg/dL). Liver Function Tests: Recent Labs  Lab 12/05/19 1835 12/06/19 0614  AST 22 25  ALT 18 16  ALKPHOS 87 80  BILITOT 0.5 0.4  PROT 7.1 6.3*  ALBUMIN 3.3* 2.9*   No results for input(s): LIPASE, AMYLASE in the last 168 hours. No results for input(s): AMMONIA in the last 168 hours. Coagulation Profile: No results for input(s): INR, PROTIME in the last 168 hours. Cardiac Enzymes: No results for input(s): CKTOTAL, CKMB, CKMBINDEX, TROPONINI in the last 168 hours. BNP (last 3 results) No results for input(s): PROBNP in the last 8760 hours. HbA1C: No results for input(s): HGBA1C in the last 72 hours. CBG: Recent Labs  Lab 12/07/19 1644 12/07/19 2112 12/08/19 0723 12/08/19 1134 12/08/19 1623  GLUCAP 252* 310* 256* 298* 258*   Lipid Profile: No results for input(s):  CHOL, HDL, LDLCALC, TRIG, CHOLHDL, LDLDIRECT in the last 72 hours. Thyroid Function Tests: No results for input(s): TSH, T4TOTAL, FREET4, T3FREE, THYROIDAB in the last 72 hours. Anemia Panel: No results for input(s): VITAMINB12, FOLATE, FERRITIN, TIBC, IRON, RETICCTPCT in the last 72 hours. Sepsis Labs: Recent Labs  Lab 12/05/19 1835 12/05/19 2007  LATICACIDVEN 2.8* 2.7*    Recent Results (from the past 240 hour(s))  Blood culture (routine x 2)     Status: None (Preliminary result)   Collection Time: 12/05/19  6:36 PM   Specimen: Right Antecubital; Blood  Result Value Ref Range Status   Specimen Description RIGHT ANTECUBITAL  Final   Special Requests   Final    BOTTLES DRAWN AEROBIC AND ANAEROBIC Blood Culture adequate volume   Culture   Final    NO GROWTH 3 DAYS Performed at [facility], [address], [place], [place]  [id number]    Report Status PENDING  Incomplete  Blood culture (routine x 2)     Status: None (Preliminary result)   Collection Time: 12/05/19  6:36 PM   Specimen: Left Antecubital; Blood  Result Value Ref Range Status   Specimen Description LEFT ANTECUBITAL  Final   Special Requests   Final    BOTTLES DRAWN AEROBIC AND ANAEROBIC Blood Culture adequate volume   Culture   Final    NO GROWTH 3 DAYS Performed at [facility], [address] [place], [place] [address]    Report Status PENDING  Incomplete  Respiratory Panel by RT PCR (Flu A&B, Covid) - Nasopharyngeal Swab     Status: None   Collection Time: 12/05/19  8:37 PM   Specimen: Nasopharyngeal Swab  Result Value Ref Range Status   SARS Coronavirus 2 by RT PCR NEGATIVE NEGATIVE Final    Comment: (NOTE) SARS-CoV-2 target nucleic acids are NOT DETECTED.  The SARS-CoV-2 RNA is generally detectable in upper respiratoy specimens during the acute phase of infection. The lowest concentration of SARS-CoV-2 viral copies this assay can detect is 131 copies/mL. A negative result does not preclude  SARS-Cov-2 infection and should not be used as the sole basis for treatment or other patient management decisions. A negative result may occur with  improper specimen collection/handling, submission of specimen other than nasopharyngeal swab, presence of viral mutation(s) within the areas targeted by this assay, and inadequate number of viral copies (<131 copies/mL). A negative result must be combined with clinical observations, patient history, and epidemiological information. The expected result is Negative.  Fact Sheet for Patients:  https://www.moore.com/https://www.fda.gov/media/142436/download  Fact Sheet for Healthcare Providers:  https://www.young.biz/https://www.fda.gov/media/142435/download  This test is no t yet approved or cleared by the [place] FDA and  has been authorized for detection and/or diagnosis of SARS-CoV-2 by FDA under an Emergency Use Authorization (EUA). This EUA will remain  in effect (meaning this test can be used) for the duration of the COVID-19 declaration under Section 564(b)(1) of the Act, 21 U.S.C. section 360bbb-3(b)(1), unless the authorization is terminated or revoked sooner.     Influenza A by PCR NEGATIVE NEGATIVE Final   Influenza B by PCR NEGATIVE NEGATIVE Final    Comment: (NOTE) The Xpert Xpress SARS-CoV-2/FLU/RSV assay is intended as an aid in  the diagnosis of influenza from Nasopharyngeal swab specimens and  should not be used as a sole basis for treatment. Nasal washings and  aspirates are unacceptable for Xpert Xpress SARS-CoV-2/FLU/RSV  testing.  Fact Sheet for Patients: https://www.moore.com/https://www.fda.gov/media/142436/download  Fact Sheet for Healthcare Providers: https://www.young.biz/https://www.fda.gov/media/142435/download  This test is not yet approved or cleared by the [place] FDA and  has been authorized for detection and/or diagnosis of SARS-CoV-2 by  FDA under an Emergency Use Authorization (EUA). This EUA will remain  in effect (meaning this test can be used) for the duration of the   Covid-19 declaration under Section 564(b)(1) of the Act, 21  U.S.C. section 360bbb-3(b)(1), unless the authorization is  terminated or revoked. Performed at [facility], [address] [place], [place] [id number]          Radiology Studies: US ARTERIAL ABI (SCREENING LOWER EXTREMITY)  Result Date: 12/07/2019 CLINICAL DATA:  Left foot swelling, left foot plantar surface ulcer, obesity, previous left fifth toe amputation, smoker, hypertension, diabetes EXAM: NONINVASIVE PHYSIOLOGIC VASCULAR STUDY OF BILATERAL LOWER EXTREMITIES TECHNIQUE: Evaluation of both lower extremities were performed at rest, including calculation of ankle-brachial indices with single level Doppler, pressure and  pulse volume recording. COMPARISON:  None. FINDINGS: Right ABI:  1.18 Left ABI:  1.14 Right Lower Extremity: Biphasic right posterior tibial and dorsalis pedis waveforms with normal amplitude Left Lower Extremity: Similar biphasic left posterior tibial and dorsalis pedis waveforms with normal amplitude. 1.0-1.4 Normal IMPRESSION: Normal ABIs at rest Electronically Signed   By: Judie Petit.  Shick M.D.   On: 12/07/2019 13:28        Scheduled Meds: . apixaban  5 mg Oral BID  . buPROPion  100 mg Oral BID  . clopidogrel  75 mg Oral Daily  . DULoxetine  60 mg Oral BID  . feeding supplement (GLUCERNA SHAKE)  237 mL Oral BID BM  . furosemide  40 mg Oral BID  . influenza vac split quadrivalent PF  0.5 mL Intramuscular Tomorrow-1000  . insulin aspart  0-20 Units Subcutaneous TID WC  . insulin aspart  0-5 Units Subcutaneous QHS  . insulin detemir  80 Units Subcutaneous BID  . lisinopril  2.5 mg Oral Daily  . metoprolol tartrate  50 mg Oral BID  . multivitamin with minerals  1 tablet Oral Daily  . mupirocin ointment   Topical Daily  . nicotine  21 mg Transdermal Daily  . nutrition supplement (JUVEN)  1 packet Oral BID BM  . pantoprazole  40 mg Oral Daily  . pneumococcal 23 valent vaccine  0.5 mL Intramuscular  Tomorrow-1000  . Ensure Max Protein  11 oz Oral Daily  . simvastatin  40 mg Oral q1800   Continuous Infusions: . cefTRIAXone (ROCEPHIN)  IV 2 g (12/07/19 2217)   And  . metronidazole 10 mL/hr at 12/08/19 1407  . lactated ringers 100 mL/hr at 12/08/19 1700     LOS: 3 days    Time spent:    Erick Blinks, MD [facility]   If 7PM-7AM, please contact night-coverage [email or web address]  12/08/2019, 7:37 PM

## 2019-12-08 NOTE — Plan of Care (Signed)

## 2019-12-09 DIAGNOSIS — E11621 Type 2 diabetes mellitus with foot ulcer: Secondary | ICD-10-CM | POA: Diagnosis not present

## 2019-12-09 DIAGNOSIS — J449 Chronic obstructive pulmonary disease, unspecified: Secondary | ICD-10-CM | POA: Diagnosis not present

## 2019-12-09 DIAGNOSIS — L03116 Cellulitis of left lower limb: Secondary | ICD-10-CM | POA: Diagnosis not present

## 2019-12-09 DIAGNOSIS — E1169 Type 2 diabetes mellitus with other specified complication: Secondary | ICD-10-CM | POA: Diagnosis not present

## 2019-12-09 LAB — BASIC METABOLIC PANEL
Anion gap: 9 (ref 5–15)
BUN: 32 mg/dL — ABNORMAL HIGH (ref 6–20)
CO2: 32 mmol/L (ref 22–32)
Calcium: 9.1 mg/dL (ref 8.9–10.3)
Chloride: 96 mmol/L — ABNORMAL LOW (ref 98–111)
Creatinine, Ser: 0.81 mg/dL (ref 0.44–1.00)
GFR, Estimated: >60 mL/min (ref >60)
Glucose, Bld: 244 mg/dL — ABNORMAL HIGH (ref 70–99)
Potassium: 4.3 mmol/L (ref 3.5–5.1)
Sodium: 137 mmol/L (ref 135–145)

## 2019-12-09 LAB — CBC
HCT: 35.4 % — ABNORMAL LOW (ref 36.0–46.0)
Hemoglobin: 10.9 g/dL — ABNORMAL LOW (ref 12.0–15.0)
MCH: 30.4 pg (ref 26.0–34.0)
MCHC: 30.8 g/dL (ref 30.0–36.0)
MCV: 98.6 fL (ref 80.0–100.0)
Platelets: 229 10*3/uL (ref 150–400)
RBC: 3.59 MIL/uL — ABNORMAL LOW (ref 3.87–5.11)
RDW: 14 % (ref 11.5–15.5)
WBC: 15.8 10*3/uL — ABNORMAL HIGH (ref 4.0–10.5)
nRBC: 0 % (ref 0.0–0.2)

## 2019-12-09 LAB — GLUCOSE, CAPILLARY
Glucose-Capillary: 218 mg/dL — ABNORMAL HIGH (ref 70–99)
Glucose-Capillary: 229 mg/dL — ABNORMAL HIGH (ref 70–99)
Glucose-Capillary: 100 mg/dL — ABNORMAL HIGH (ref 70–99)
Glucose-Capillary: 150 mg/dL — ABNORMAL HIGH (ref 70–99)

## 2019-12-09 MED ORDER — LOPERAMIDE HCL 2 MG PO CAPS: 2.0000 mg | ORAL_CAPSULE | Freq: Four times a day (QID) | ORAL | Status: DC | PRN

## 2019-12-09 NOTE — Progress Notes (Signed)
MD notified of pt request for Imodium. Pt stated multiple bm this shift. Stools are not liquid

## 2019-12-09 NOTE — Progress Notes (Signed)
PROGRESS NOTE    Nicole [name]  KDT:[id number] DOB: 04-22-1965 DOA: 12/05/2019 PCP: Pcp, No    Brief Narrative:  Nicole [name]  is a 54 y.o. female, with past medical history of COPD, hyperlipidemia, hypertension, morbid obesity, type 2 diabetes mellitus insulin-dependent, CAD s/p PCI, chronic diastolic CHF, A. fib on Eliquis, OSA on CPAP, patient presents to ED for wound check, patient reports wound in the left foot for last 2 weeks, initially started at the blister, for the last 3 days did progress where it had some foul-smelling odor and discharge, as well she reports her blood sugar was uncontrolled, she denies fever, chills, nausea or vomiting. -In ED lactic acid was 2.8, white blood cells was elevated at 14.7 K, blood glucose was elevated at 324, x-ray with no evidence of osteomyelitis, but she has significant wound for which she was started on Zosyn, and [facility] hospitalist consulted to admit   Assessment & Plan:   Active Problems:   Coronary atherosclerosis of native coronary artery   Morbid obesity (HCC)   Diabetes mellitus type 2 in obese (HCC)   HTN (hypertension), benign   COPD (chronic obstructive pulmonary disease) (HCC)   TIA (transient ischemic attack)   Diabetic ulcer of left foot (HCC)   Left foot diabetic ulcer with associated cellulitis -MRI negative for underlying osteo or abscess -Appreciate general surgery input.  No indication for debridement at this time -Appreciate WOC recommendations -Overall cellulitis improved with IV antibiotics -We will transition to oral antibiotics -ABIs greater than 1 bilaterally  Diarrhea -Reports having 4 bowel movements overnight and also had an accident in her bed today. -She does continue to have a leukocytosis that has trended up. -We will check stool for C. difficile  Atrial fibrillation -Heart rate currently stable on beta-blockers -Anticoagulated with Eliquis  History of coronary artery disease status post stents in  the past -No complaints of chest pain at this time -Continue on Plavix, statin, beta-blockers and lisinopril  Hypertension -Blood pressure stable on current medications  Chronic diastolic congestive heart failure. -Ejection fraction of 50% in 03/2019 -Since she had elevated lactic acid on admission, holding Lasix for now  COPD -No wheezing at this time -Continue bronchodilators as needed  Morbid obesity. -Counseled on importance of diet and exercise  Obstructive sleep apnea -Continue on CPAP   DVT prophylaxis:  apixaban (ELIQUIS) tablet 5 mg  Code Status: Full code Family Communication: Discussed with patient Disposition Plan: Status is: Inpatient  Remains inpatient appropriate because:IV treatments appropriate due to intensity of illness or inability to take PO   Dispo: The patient is from: Home              Anticipated d/c is to: Home              Anticipated d/c date is: 1 day              Patient currently is not medically stable to d/c.    Consultants:   General surgery  Procedures:     Antimicrobials:   Ceftriaxone 10/13 > 10/16  Flagyl 10/13 > 10/16  augmentin 10/16>  Doxycycline 10/16>   Subjective: Does not feel well today. Continues to have loose stools overnight. Feels tired and weak today  Objective: Vitals:   12/09/19 1032 12/09/19 1048 12/09/19 1511 12/09/19 2027  BP: (!) 168/58  112/71 (!) 148/59  Pulse: 68  60 64  Resp: 20  (!) 22   Temp: 97.7 F (36.5 C)  98 F (  36.7 C) 97.9 F (36.6 C)  TempSrc: Oral  Oral   SpO2: 100% 100% 98% 100%  Weight:      Height:        Intake/Output Summary (Last 24 hours) at 12/09/2019 2045 Last data filed at 12/09/2019 1240 Gross per 24 hour  Intake 2339.97 ml  Output --  Net 2339.97 ml   Filed Weights   12/05/19 1612  Weight: 131.1 kg    Examination:  General exam: Alert, awake, oriented x 3 Respiratory system: Clear to auscultation. Respiratory effort normal. Cardiovascular  system:RRR. No murmurs, rubs, gallops. Gastrointestinal system: Abdomen is nondistended, soft and nontender. No organomegaly or masses felt. Normal bowel sounds heard. Central nervous system: Alert and oriented. No focal neurological deficits. Extremities: No C/C/E, +pedal pulses Skin: erythema in left leg is improving Psychiatry: Judgement and insight appear normal. Mood & affect appropriate.    Data Reviewed: I have personally reviewed following labs and imaging studies  CBC: Recent Labs  Lab 12/05/19 1835 12/06/19 0614 12/07/19 0508 12/08/19 0919 12/09/19 0529  WBC 14.7* 13.0* 12.0* 14.5* 15.8*  NEUTROABS 9.6*  --   --   --   --   HGB 12.7 11.8* 11.2* 11.2* 10.9*  HCT 39.3 36.8 35.4* 37.3 35.4*  MCV 95.6 95.1 97.3 100.8* 98.6  PLT 244 207 218 219 229   Basic Metabolic Panel: Recent Labs  Lab 12/05/19 1835 12/06/19 0614 12/08/19 0919 12/09/19 0529  NA 137 136 140 137  K 3.7 3.7 4.5 4.3  CL 94* 97* 98 96*  CO2 32 27 34* 32  GLUCOSE 324* 298* 275* 244*  BUN 16 19 26* 32*  CREATININE 0.89 0.89 0.87 0.81  CALCIUM 8.8* 8.1* 9.3 9.1   GFR: Estimated Creatinine Clearance: 103.9 mL/min (by C-G formula based on SCr of 0.81 mg/dL). Liver Function Tests: Recent Labs  Lab 12/05/19 1835 12/06/19 0614  AST 22 25  ALT 18 16  ALKPHOS 87 80  BILITOT 0.5 0.4  PROT 7.1 6.3*  ALBUMIN 3.3* 2.9*   No results for input(s): LIPASE, AMYLASE in the last 168 hours. No results for input(s): AMMONIA in the last 168 hours. Coagulation Profile: No results for input(s): INR, PROTIME in the last 168 hours. Cardiac Enzymes: No results for input(s): CKTOTAL, CKMB, CKMBINDEX, TROPONINI in the last 168 hours. BNP (last 3 results) No results for input(s): PROBNP in the last 8760 hours. HbA1C: No results for input(s): HGBA1C in the last 72 hours. CBG: Recent Labs  Lab 12/08/19 2057 12/09/19 0738 12/09/19 1103 12/09/19 1707 12/09/19 2028  GLUCAP 203* 218* 229* 100* 150*   Lipid  Profile: No results for input(s): CHOL, HDL, LDLCALC, TRIG, CHOLHDL, LDLDIRECT in the last 72 hours. Thyroid Function Tests: No results for input(s): TSH, T4TOTAL, FREET4, T3FREE, THYROIDAB in the last 72 hours. Anemia Panel: No results for input(s): VITAMINB12, FOLATE, FERRITIN, TIBC, IRON, RETICCTPCT in the last 72 hours. Sepsis Labs: Recent Labs  Lab 12/05/19 1835 12/05/19 2007  LATICACIDVEN 2.8* 2.7*    Recent Results (from the past 240 hour(s))  Blood culture (routine x 2)     Status: None (Preliminary result)   Collection Time: 12/05/19  6:36 PM   Specimen: Right Antecubital; Blood  Result Value Ref Range Status   Specimen Description RIGHT ANTECUBITAL  Final   Special Requests   Final    BOTTLES DRAWN AEROBIC AND ANAEROBIC Blood Culture adequate volume   Culture   Final    NO GROWTH 3 DAYS Performed at [facility], [address] [place], [place] [address]    Report Status PENDING  Incomplete  Blood culture (routine x 2)     Status: None (Preliminary result)   Collection Time: 12/05/19  6:36 PM   Specimen: Left Antecubital; Blood  Result Value Ref Range Status   Specimen Description LEFT ANTECUBITAL  Final   Special Requests   Final    BOTTLES DRAWN AEROBIC AND ANAEROBIC Blood Culture adequate volume   Culture   Final    NO GROWTH 3 DAYS Performed at [facility], [address], [place], [place] [address]    Report Status PENDING  Incomplete  Respiratory Panel by RT PCR (Flu A&B, Covid) - Nasopharyngeal Swab     Status: None   Collection Time: 12/05/19  8:37 PM   Specimen: Nasopharyngeal Swab  Result Value Ref Range Status   SARS Coronavirus 2 by RT PCR NEGATIVE NEGATIVE Final    Comment: (NOTE) SARS-CoV-2 target nucleic acids are NOT DETECTED.  The SARS-CoV-2 RNA is generally detectable in upper respiratoy specimens during the acute phase of infection. The lowest concentration of SARS-CoV-2 viral copies this assay can detect is 131 copies/mL. A negative  result does not preclude SARS-Cov-2 infection and should not be used as the sole basis for treatment or other patient management decisions. A negative result may occur with  improper specimen collection/handling, submission of specimen other than nasopharyngeal swab, presence of viral mutation(s) within the areas targeted by this assay, and inadequate number of viral copies (<131 copies/mL). A negative result must be combined with clinical observations, patient history, and epidemiological information. The expected result is Negative.  Fact Sheet for Patients:  https://www.moore.com/  Fact Sheet for Healthcare Providers:  https://www.young.biz/  This test is no t yet approved or cleared by the [place] FDA and  has been authorized for detection and/or diagnosis of SARS-CoV-2 by FDA under an Emergency Use Authorization (EUA). This EUA will remain  in effect (meaning this test can be used) for the duration of the COVID-19 declaration under Section 564(b)(1) of the Act, 21 U.S.C. section 360bbb-3(b)(1), unless the authorization is terminated or revoked sooner.     Influenza A by PCR NEGATIVE NEGATIVE Final   Influenza B by PCR NEGATIVE NEGATIVE Final    Comment: (NOTE) The Xpert Xpress SARS-CoV-2/FLU/RSV assay is intended as an aid in  the diagnosis of influenza from Nasopharyngeal swab specimens and  should not be used as a sole basis for treatment. Nasal washings and  aspirates are unacceptable for Xpert Xpress SARS-CoV-2/FLU/RSV  testing.  Fact Sheet for Patients: https://www.moore.com/  Fact Sheet for Healthcare Providers: https://www.young.biz/  This test is not yet approved or cleared by the [place] FDA and  has been authorized for detection and/or diagnosis of SARS-CoV-2 by  FDA under an Emergency Use Authorization (EUA). This EUA will remain  in effect (meaning this test can be used)  for the duration of the  Covid-19 declaration under Section 564(b)(1) of the Act, 21  U.S.C. section 360bbb-3(b)(1), unless the authorization is  terminated or revoked. Performed at [facility], [address], [place], [place] [address]          Radiology Studies: No results found.      Scheduled Meds: . amoxicillin-clavulanate  1 tablet Oral Q12H  . apixaban  5 mg Oral BID  . buPROPion  100 mg Oral BID  . clopidogrel  75 mg Oral Daily  . doxycycline  100 mg Oral Q12H  . DULoxetine  60 mg Oral BID  . feeding supplement (GLUCERNA SHAKE)  237 mL Oral BID BM  . influenza vac split quadrivalent PF  0.5 mL Intramuscular Tomorrow-1000  . insulin aspart  0-20 Units Subcutaneous TID WC  . insulin aspart  0-5 Units Subcutaneous QHS  . insulin detemir  80 Units Subcutaneous BID  . lisinopril  2.5 mg Oral Daily  . metoprolol tartrate  50 mg Oral BID  . multivitamin with minerals  1 tablet Oral Daily  . mupirocin ointment   Topical Daily  . nicotine  21 mg Transdermal Daily  . nutrition supplement (JUVEN)  1 packet Oral BID BM  . pantoprazole  40 mg Oral Daily  . pneumococcal 23 valent vaccine  0.5 mL Intramuscular Tomorrow-1000  . Ensure Max Protein  11 oz Oral Daily  . simvastatin  40 mg Oral q1800   Continuous Infusions:    LOS: 4 days    Time spent:    Erick Blinks, MD [facility]   If 7PM-7AM, please contact night-coverage [email or web address]  12/09/2019, 8:45 PM

## 2019-12-10 DIAGNOSIS — J449 Chronic obstructive pulmonary disease, unspecified: Secondary | ICD-10-CM | POA: Diagnosis not present

## 2019-12-10 DIAGNOSIS — L03116 Cellulitis of left lower limb: Secondary | ICD-10-CM | POA: Diagnosis not present

## 2019-12-10 DIAGNOSIS — E08621 Diabetes mellitus due to underlying condition with foot ulcer: Secondary | ICD-10-CM

## 2019-12-10 DIAGNOSIS — E1169 Type 2 diabetes mellitus with other specified complication: Secondary | ICD-10-CM | POA: Diagnosis not present

## 2019-12-10 DIAGNOSIS — L97422 Non-pressure chronic ulcer of left heel and midfoot with fat layer exposed: Secondary | ICD-10-CM

## 2019-12-10 LAB — BASIC METABOLIC PANEL
Anion gap: 10 (ref 5–15)
BUN: 25 mg/dL — ABNORMAL HIGH (ref 6–20)
CO2: 35 mmol/L — ABNORMAL HIGH (ref 22–32)
Calcium: 9.4 mg/dL (ref 8.9–10.3)
Chloride: 96 mmol/L — ABNORMAL LOW (ref 98–111)
Creatinine, Ser: 0.71 mg/dL (ref 0.44–1.00)
GFR, Estimated: >60 mL/min (ref >60)
Glucose, Bld: 74 mg/dL (ref 70–99)
Potassium: 4.5 mmol/L (ref 3.5–5.1)
Sodium: 141 mmol/L (ref 135–145)

## 2019-12-10 LAB — CULTURE, BLOOD (ROUTINE X 2)
Culture: NO GROWTH
Culture: NO GROWTH
Special Requests: ADEQUATE

## 2019-12-10 LAB — GLUCOSE, CAPILLARY
Glucose-Capillary: 67 mg/dL — ABNORMAL LOW (ref 70–99)
Glucose-Capillary: 67 mg/dL — ABNORMAL LOW (ref 70–99)
Glucose-Capillary: 70 mg/dL (ref 70–99)

## 2019-12-10 LAB — CBC
HCT: 34.3 % — ABNORMAL LOW (ref 36.0–46.0)
Hemoglobin: 10.6 g/dL — ABNORMAL LOW (ref 12.0–15.0)
MCH: 30.9 pg (ref 26.0–34.0)
MCHC: 30.9 g/dL (ref 30.0–36.0)
MCV: 100 fL (ref 80.0–100.0)
Platelets: 219 10*3/uL (ref 150–400)
RBC: 3.43 MIL/uL — ABNORMAL LOW (ref 3.87–5.11)
RDW: 14.2 % (ref 11.5–15.5)
WBC: 12.1 10*3/uL — ABNORMAL HIGH (ref 4.0–10.5)
nRBC: 0 % (ref 0.0–0.2)

## 2019-12-10 MED ORDER — DOXYCYCLINE HYCLATE 100 MG PO TABS
100.0000 mg | ORAL_TABLET | Freq: Two times a day (BID) | ORAL | 0 refills | Status: DC
Start: 2019-12-10 — End: 2020-01-25

## 2019-12-10 MED ORDER — LOPERAMIDE HCL 2 MG PO CAPS: 2.0000 mg | ORAL_CAPSULE | Freq: Four times a day (QID) | ORAL | 0 refills | Status: DC | PRN

## 2019-12-10 MED ORDER — MUPIROCIN 2 % EX OINT: TOPICAL_OINTMENT | Freq: Every day | CUTANEOUS | 0 refills | Status: DC

## 2019-12-10 MED ORDER — AMOXICILLIN-POT CLAVULANATE 875-125 MG PO TABS
1.0000 | ORAL_TABLET | Freq: Two times a day (BID) | ORAL | 0 refills | Status: DC
Start: 2019-12-10 — End: 2020-01-25

## 2019-12-10 NOTE — Progress Notes (Signed)
Dressing change done on left foot prior to discharge. Discharge instructions provided to patient. Patient had no further questions or concerns. Husband received patient.

## 2019-12-10 NOTE — Discharge Summary (Signed)
Physician Discharge Summary  Nicole [name] RKY:[id number] DOB: 08-Feb-1966 DOA: 12/05/2019  PCP: Patient, No Pcp Per  Admit date: 12/05/2019 Discharge date: 12/10/2019  Admitted From: Home Disposition: Home  Recommendations for Outpatient Follow-up:  1. Follow up with PCP in 1-2 weeks 2. Please obtain BMP/CBC in one week 3. Patient has been referred to outpatient PT for wound care 4. She has been set up with podiatry as an outpatient  Home Health: Home health RN, PT Equipment/Devices:  Discharge Condition: Stable CODE STATUS: Full code Diet recommendation: Heart healthy, carb modified  Brief/Interim Summary: LisaDiedrichis a54 y.o.female,with past medical history of COPD, hyperlipidemia, hypertension, morbid obesity, type 2 diabetes mellitus insulin-dependent, CAD s/p PCI, chronic diastolic CHF, A. fib on Eliquis, OSA on CPAP, patient presents to ED for wound check, patient reports wound in the left foot for last 2 weeks, initially started at the blister, for the last 3 days did progress where it had some foul-smelling odor and discharge, as well she reports her blood sugar was uncontrolled, she denies fever, chills, nausea or vomiting. -In ED lactic acid was 2.8, white blood cells was elevated at 14.7K, blood glucosewas elevated at 324, x-ray with no evidence of osteomyelitis, but she has significant wound for which she was started on Zosyn, and [facility] consulted to admit  Discharge Diagnoses:  Active Problems:   Coronary atherosclerosis of native coronary artery   Morbid obesity (HCC)   Diabetes mellitus type 2 in obese (HCC)   HTN (hypertension), benign   COPD (chronic obstructive pulmonary disease) (HCC)   TIA (transient ischemic attack)   Diabetic ulcer of left foot (HCC)  Left foot diabetic ulcer with associated cellulitis -MRI negative for underlying osteo or abscess -Appreciate general surgery input.  No indication for debridement at this  time -Appreciate WOC recommendations -Overall cellulitis improved with IV antibiotics -We will transition to oral antibiotics -ABIs greater than 1 bilaterally -She has been set up with podiatry for further management  Diarrhea -Reports having 4 bowel movements overnight and also had an accident in her bed today. -She does continue to have a leukocytosis that has trended up. -C. difficile was not checked since stool was not liquid in consistency -Overall diarrhea has improved  Atrial fibrillation -Heart rate currently stable on beta-blockers -Anticoagulated with Eliquis  History of coronary artery disease status post stents in the past -No complaints of chest pain at this time -Continue on Plavix, statin, beta-blockers and lisinopril  Hypertension -Blood pressure stable on current medications  Chronic diastolic congestive heart failure. -Ejection fraction of 50% in 03/2019 -Since she had elevated lactic acid on admission, holding Lasix for now -Resume Lasix on discharge  COPD -No wheezing at this time -Continue bronchodilators as needed  Morbid obesity. -Counseled on importance of diet and exercise  Obstructive sleep apnea -Continue on CPAP  Discharge Instructions  Discharge Instructions    Ambulatory referral to Physical Therapy   Complete by: As directed    Left foot wound care   Diet - low sodium heart healthy   Complete by: As directed    Discharge wound care:   Complete by: As directed    Cleanse bilateral lower legs with soap and water and pat dry.   Fill wound to left great toe with iodoform packing strip.  Cover with dry gauze and kerlix/tape.  Change daily.   Cleanse wounds to  tips of toes with soap and water and pat dry. Apply mupirocin ointment and cover with dry dressings. Change daily.  Increase activity slowly   Complete by: As directed      Allergies as of 12/10/2019      Reactions   Aspirin Anaphylaxis   Bee Venom Anaphylaxis   Food  Anaphylaxis   PINEAPPLE   E-mycin [erythromycin Base] Nausea And Vomiting      Medication List    STOP taking these medications   ibuprofen 200 MG tablet Commonly known as: ADVIL     TAKE these medications   amoxicillin-clavulanate 875-125 MG tablet Commonly known as: AUGMENTIN Take 1 tablet by mouth every 12 (twelve) hours.   buPROPion 100 MG 12 hr tablet Commonly known as: WELLBUTRIN SR Take 100 mg by mouth 2 (two) times daily.   citalopram 20 MG tablet Commonly known as: CELEXA Take 20 mg by mouth daily.   clopidogrel 75 MG tablet Commonly known as: PLAVIX Take 75 mg by mouth daily.   doxycycline 100 MG tablet Commonly known as: VIBRA-TABS Take 1 tablet (100 mg total) by mouth every 12 (twelve) hours.   DULoxetine 60 MG capsule Commonly known as: CYMBALTA Take 60 mg by mouth 2 (two) times daily.   Eliquis 5 MG Tabs tablet Generic drug: apixaban Take 5 mg by mouth 2 (two) times daily.   EPINEPHrine 0.3 mg/0.3 mL Soaj injection Commonly known as: EPI-PEN Inject 0.3 mg into the muscle as needed for anaphylaxis.   furosemide 40 MG tablet Commonly known as: LASIX Take 1 tablet (40 mg total) by mouth 2 (two) times daily.   glipiZIDE 10 MG tablet Commonly known as: GLUCOTROL Take 10 mg by mouth in the morning and at bedtime.   insulin aspart 100 UNIT/ML injection Commonly known as: novoLOG Inject 60 Units into the skin 3 (three) times daily with meals.   insulin detemir 100 UNIT/ML injection Commonly known as: LEVEMIR Inject 100 Units into the skin 2 (two) times daily.   insulin regular 100 units/mL injection Commonly known as: NOVOLIN R Inject 100 Units into the skin See admin instructions. 0-20 per sliding scale   lisinopril 2.5 MG tablet Commonly known as: ZESTRIL Take 2.5 mg by mouth daily.   loperamide 2 MG capsule Commonly known as: IMODIUM Take 1 capsule (2 mg total) by mouth every 6 (six) hours as needed for diarrhea or loose stools.    metoprolol tartrate 50 MG tablet Commonly known as: LOPRESSOR Take 50 mg by mouth 2 (two) times daily.   mupirocin ointment 2 % Commonly known as: BACTROBAN Apply topically daily.   One-A-Day Womens Formula Tabs Take 1 tablet by mouth daily.   pantoprazole 40 MG tablet Commonly known as: Protonix Take 1 tablet (40 mg total) by mouth daily. Take 30-60 min before first meal of the day   simvastatin 80 MG tablet Commonly known as: ZOCOR Take 80 mg by mouth daily.   Ventolin HFA 108 (90 Base) MCG/ACT inhaler Generic drug: albuterol Inhale 2 puffs into the lungs as needed. Shortness of breath   albuterol (2.5 MG/3ML) 0.083% nebulizer solution Commonly known as: PROVENTIL Take 3 mLs (2.5 mg total) by nebulization every 6 (six) hours as needed for wheezing.            Discharge Care Instructions  (From admission, onward)         Start     Ordered   12/10/19 0000  Discharge wound care:       Comments: Cleanse bilateral lower legs with soap and water and pat dry.   Fill wound to left great toe with  iodoform packing strip.  Cover with dry gauze and kerlix/tape.  Change daily.   Cleanse wounds to  tips of toes with soap and water and pat dry. Apply mupirocin ointment and cover with dry dressings. Change daily.   12/10/19 1120          Follow-up Information    Ferman Hamming, DPM Follow up on 12/21/2019.   Specialty: Podiatry Why: 2:00pm Please arrive 15-57mins early to fill out new patient paperwork Please bring insurance card, copay and list of medications when you arrive Contact information: [address] [place] [address] [phone]              Allergies  Allergen Reactions  . Aspirin Anaphylaxis  . Bee Venom Anaphylaxis  . Food Anaphylaxis    PINEAPPLE  . E-Mycin [Erythromycin Base] Nausea And Vomiting    Consultations:     Procedures/Studies: MR FOOT LEFT WO CONTRAST  Result Date: 12/06/2019 CLINICAL DATA:  Foot ulcer with  swelling, diabetes. EXAM: MRI OF THE LEFT FOOT WITHOUT CONTRAST TECHNIQUE: Multiplanar, multisequence MR imaging of the left forefoot was performed. No intravenous contrast was administered. COMPARISON:  Radiographs 12/05/2019 FINDINGS: Despite efforts by the technologist and patient, motion artifact is present on today's exam and could not be eliminated. This reduces exam sensitivity and specificity. Fat saturation failed on the coronal T2 weighted images despite repeats. Bones/Joint/Cartilage Prior amputation of the small toe at the MTP joint. Low-grade edema signal in the base of the third and second metatarsals and in the adjacent kidney a forms, thought to be degenerative given the associated spurring. No malalignment at the Lisfranc joint. There also degenerative findings between the middle and lateral cuneiforms. No osteomyelitis is identified. Ligaments The Lisfranc ligament appears intact. Muscles and Tendons Low-level edema within along the atrophic plantar musculature of the foot, probably reflecting low-grade denervation edema, less likely to be from myositis. Soft tissues Cutaneous ulceration along the plantar-medial margin of the medial ball of the foot just distal to the level of the first MTP joint. In addition to the cutaneous defect shown on image 19 of series 5, there is localized abnormal inflammation in the adjacent subcutaneous tissues along with cutaneous thickening in the region, compatible with cellulitis. No osseous edema or bony destructive findings to indicate osteomyelitis. Edema tracks in the dorsum of the foot, cellulitis is not excluded. IMPRESSION: 1. Cutaneous ulceration along the plantar-medial margin of the medial ball of the foot just distal to the level of the first MTP joint. Adjacent cellulitis is present and may be tracking along the dorsum of the foot. No drainable abscess, osteomyelitis, or gas tracking in the soft tissues observed. 2. Low-level edema within along the  atrophic plantar musculature of the foot, probably reflecting low-grade denervation edema, less likely to be from myositis. 3. Edema tracks in the dorsum of the foot, cellulitis is not excluded. 4. Degenerative findings in the middle and lateral cuneiforms and in the bases of the second and third metatarsals. 5. Prior amputation of the small toe at the MTP joint. Electronically Signed   By: Gaylyn Rong M.D.   On: 12/06/2019 08:33   US ARTERIAL ABI (SCREENING LOWER EXTREMITY)  Result Date: 12/07/2019 CLINICAL DATA:  Left foot swelling, left foot plantar surface ulcer, obesity, previous left fifth toe amputation, smoker, hypertension, diabetes EXAM: NONINVASIVE PHYSIOLOGIC VASCULAR STUDY OF BILATERAL LOWER EXTREMITIES TECHNIQUE: Evaluation of both lower extremities were performed at rest, including calculation of ankle-brachial indices with single level Doppler, pressure and pulse  volume recording. COMPARISON:  None. FINDINGS: Right ABI:  1.18 Left ABI:  1.14 Right Lower Extremity: Biphasic right posterior tibial and dorsalis pedis waveforms with normal amplitude Left Lower Extremity: Similar biphasic left posterior tibial and dorsalis pedis waveforms with normal amplitude. 1.0-1.4 Normal IMPRESSION: Normal ABIs at rest Electronically Signed   By: Judie Petit.  Shick M.D.   On: 12/07/2019 13:28   DG Foot Complete Left  Result Date: 12/05/2019 CLINICAL DATA:  Diabetic ulceration EXAM: LEFT FOOT - COMPLETE 3+ VIEW COMPARISON:  November 07, 2009 FINDINGS: Frontal, oblique, and lateral views were obtained. Patient has had amputation at the level of the fifth MTP joint. Cortical remodeling is noted along the distal aspect of the fifth metatarsal. There is no acute fracture or dislocation. No erosive change or bony destruction. There are spurs arising from the posterior and inferior calcaneus. There is diffuse soft tissue swelling in the mid and forefoot regions. There is soft tissue ulceration medial to the first  MTP joint. No associated radiopaque foreign body. An os naviculare is noted incidentally, an anatomic variant. IMPRESSION: 1. Soft tissue ulceration medial to the first MTP joint. No associated bony destruction or erosion in this area. 2. Status post amputation at the fifth MTP joint level with remodeling in the distal fifth metatarsal. 3. No fracture or dislocation. No appreciable joint space narrowing. 4.  Diffuse soft tissue swelling in the mid and forefoot regions. 5.  Calcaneal spurs noted. Electronically Signed   By: Bretta Bang III M.D.   On: 12/05/2019 18:21       Subjective: Overall pain in foot is better.  No diarrhea.  Feeling better.  Discharge Exam: Vitals:   12/10/19 0539 12/10/19 1103 12/10/19 1148 12/10/19 1329  BP: (!) 131/52 (!) 183/57    Pulse: 60 65    Resp: 20 20    Temp: 97.8 F (36.6 C)     TempSrc:      SpO2: 100% 100% 100% 97%  Weight:      Height:        General: Pt is alert, awake, not in acute distress Cardiovascular: RRR, S1/S2 +, no rubs, no gallops Respiratory: CTA bilaterally, no wheezing, no rhonchi Abdominal: Soft, NT, ND, bowel sounds + Extremities: no edema, no cyanosis    The results of significant diagnostics from this hospitalization (including imaging, microbiology, ancillary and laboratory) are listed below for reference.     Microbiology: Recent Results (from the past 240 hour(s))  Blood culture (routine x 2)     Status: None   Collection Time: 12/05/19  6:36 PM   Specimen: Right Antecubital; Blood  Result Value Ref Range Status   Specimen Description RIGHT ANTECUBITAL  Final   Special Requests   Final    BOTTLES DRAWN AEROBIC AND ANAEROBIC Blood Culture adequate volume   Culture   Final    NO GROWTH 5 DAYS Performed at [facility], [address], [place], [place] [address]    Report Status 12/10/2019 FINAL  Final  Blood culture (routine x 2)     Status: None   Collection Time: 12/05/19  6:36 PM   Specimen: Left  Antecubital; Blood  Result Value Ref Range Status   Specimen Description LEFT ANTECUBITAL  Final   Special Requests   Final    BOTTLES DRAWN AEROBIC AND ANAEROBIC Blood Culture adequate volume   Culture   Final    NO GROWTH 5 DAYS Performed at [facility], [address], [place], [place] [address]  Report Status 12/10/2019 FINAL  Final  Respiratory Panel by RT PCR (Flu A&B, Covid) - Nasopharyngeal Swab     Status: None   Collection Time: 12/05/19  8:37 PM   Specimen: Nasopharyngeal Swab  Result Value Ref Range Status   SARS Coronavirus 2 by RT PCR NEGATIVE NEGATIVE Final    Comment: (NOTE) SARS-CoV-2 target nucleic acids are NOT DETECTED.  The SARS-CoV-2 RNA is generally detectable in upper respiratoy specimens during the acute phase of infection. The lowest concentration of SARS-CoV-2 viral copies this assay can detect is 131 copies/mL. A negative result does not preclude SARS-Cov-2 infection and should not be used as the sole basis for treatment or other patient management decisions. A negative result may occur with  improper specimen collection/handling, submission of specimen other than nasopharyngeal swab, presence of viral mutation(s) within the areas targeted by this assay, and inadequate number of viral copies (<131 copies/mL). A negative result must be combined with clinical observations, patient history, and epidemiological information. The expected result is Negative.  Fact Sheet for Patients:  https://www.moore.com/  Fact Sheet for Healthcare Providers:  https://www.young.biz/  This test is no t yet approved or cleared by the [place] FDA and  has been authorized for detection and/or diagnosis of SARS-CoV-2 by FDA under an Emergency Use Authorization (EUA). This EUA will remain  in effect (meaning this test can be used) for the duration of the COVID-19 declaration under Section 564(b)(1) of the Act, 21 U.S.C. section  360bbb-3(b)(1), unless the authorization is terminated or revoked sooner.     Influenza A by PCR NEGATIVE NEGATIVE Final   Influenza B by PCR NEGATIVE NEGATIVE Final    Comment: (NOTE) The Xpert Xpress SARS-CoV-2/FLU/RSV assay is intended as an aid in  the diagnosis of influenza from Nasopharyngeal swab specimens and  should not be used as a sole basis for treatment. Nasal washings and  aspirates are unacceptable for Xpert Xpress SARS-CoV-2/FLU/RSV  testing.  Fact Sheet for Patients: https://www.moore.com/  Fact Sheet for Healthcare Providers: https://www.young.biz/  This test is not yet approved or cleared by the [place] FDA and  has been authorized for detection and/or diagnosis of SARS-CoV-2 by  FDA under an Emergency Use Authorization (EUA). This EUA will remain  in effect (meaning this test can be used) for the duration of the  Covid-19 declaration under Section 564(b)(1) of the Act, 21  U.S.C. section 360bbb-3(b)(1), unless the authorization is  terminated or revoked. Performed at [facility], [address], [place], [place] [address]      Labs: BNP (last 3 results) No results for input(s): BNP in the last 8760 hours. Basic Metabolic Panel: Recent Labs  Lab 12/05/19 1835 12/06/19 0614 12/08/19 0919 12/09/19 0529 12/10/19 0752  NA 137 136 140 137 141  K 3.7 3.7 4.5 4.3 4.5  CL 94* 97* 98 96* 96*  CO2 32 27 34* 32 35*  GLUCOSE 324* 298* 275* 244* 74  BUN 16 19 26* 32* 25*  CREATININE 0.89 0.89 0.87 0.81 0.71  CALCIUM 8.8* 8.1* 9.3 9.1 9.4   Liver Function Tests: Recent Labs  Lab 12/05/19 1835 12/06/19 0614  AST 22 25  ALT 18 16  ALKPHOS 87 80  BILITOT 0.5 0.4  PROT 7.1 6.3*  ALBUMIN 3.3* 2.9*   No results for input(s): LIPASE, AMYLASE in the last 168 hours. No results for input(s): AMMONIA in the last 168 hours. CBC: Recent Labs  Lab 12/05/19 1835 12/05/19 1835 12/06/19 5277 12/07/19 0508  12/08/19  [id number] 12/09/19 0529 12/10/19 0752  WBC 14.7*   < > 13.0* 12.0* 14.5* 15.8* 12.1*  NEUTROABS 9.6*  --   --   --   --   --   --   HGB 12.7   < > 11.8* 11.2* 11.2* 10.9* 10.6*  HCT 39.3   < > 36.8 35.4* 37.3 35.4* 34.3*  MCV 95.6   < > 95.1 97.3 100.8* 98.6 100.0  PLT 244   < > 207 218 219 229 219   < > = values in this interval not displayed.   Cardiac Enzymes: No results for input(s): CKTOTAL, CKMB, CKMBINDEX, TROPONINI in the last 168 hours. BNP: Invalid input(s): POCBNP CBG: Recent Labs  Lab 12/09/19 1707 12/09/19 2028 12/10/19 0744 12/10/19 1018 12/10/19 1123  GLUCAP 100* 150* 67* 67* 70   D-Dimer No results for input(s): DDIMER in the last 72 hours. Hgb A1c No results for input(s): HGBA1C in the last 72 hours. Lipid Profile No results for input(s): CHOL, HDL, LDLCALC, TRIG, CHOLHDL, LDLDIRECT in the last 72 hours. Thyroid function studies No results for input(s): TSH, T4TOTAL, T3FREE, THYROIDAB in the last 72 hours.  Invalid input(s): FREET3 Anemia work up No results for input(s): VITAMINB12, FOLATE, FERRITIN, TIBC, IRON, RETICCTPCT in the last 72 hours. Urinalysis    Component Value Date/Time   COLORURINE YELLOW 12/06/2019 0735   APPEARANCEUR CLEAR 12/06/2019 0735   LABSPEC 1.013 12/06/2019 0735   PHURINE 5.0 12/06/2019 0735   GLUCOSEU 50 (A) 12/06/2019 0735   HGBUR NEGATIVE 12/06/2019 0735   BILIRUBINUR NEGATIVE 12/06/2019 0735   KETONESUR NEGATIVE 12/06/2019 0735   PROTEINUR 100 (A) 12/06/2019 0735   UROBILINOGEN 1.0 02/18/2011 2336   NITRITE NEGATIVE 12/06/2019 0735   LEUKOCYTESUR NEGATIVE 12/06/2019 0735   Sepsis Labs Invalid input(s): PROCALCITONIN,  WBC,  LACTICIDVEN Microbiology Recent Results (from the past 240 hour(s))  Blood culture (routine x 2)     Status: None   Collection Time: 12/05/19  6:36 PM   Specimen: Right Antecubital; Blood  Result Value Ref Range Status   Specimen Description RIGHT ANTECUBITAL  Final   Special Requests    Final    BOTTLES DRAWN AEROBIC AND ANAEROBIC Blood Culture adequate volume   Culture   Final    NO GROWTH 5 DAYS Performed at [facility], [address] [place], [place] [address]    Report Status 12/10/2019 FINAL  Final  Blood culture (routine x 2)     Status: None   Collection Time: 12/05/19  6:36 PM   Specimen: Left Antecubital; Blood  Result Value Ref Range Status   Specimen Description LEFT ANTECUBITAL  Final   Special Requests   Final    BOTTLES DRAWN AEROBIC AND ANAEROBIC Blood Culture adequate volume   Culture   Final    NO GROWTH 5 DAYS Performed at [facility], [address] [place], [place] [address]    Report Status 12/10/2019 FINAL  Final  Respiratory Panel by RT PCR (Flu A&B, Covid) - Nasopharyngeal Swab     Status: None   Collection Time: 12/05/19  8:37 PM   Specimen: Nasopharyngeal Swab  Result Value Ref Range Status   SARS Coronavirus 2 by RT PCR NEGATIVE NEGATIVE Final    Comment: (NOTE) SARS-CoV-2 target nucleic acids are NOT DETECTED.  The SARS-CoV-2 RNA is generally detectable in upper respiratoy specimens during the acute phase of infection. The lowest concentration of SARS-CoV-2 viral copies this assay can detect is 131 copies/mL. A negative result does  not preclude SARS-Cov-2 infection and should not be used as the sole basis for treatment or other patient management decisions. A negative result may occur with  improper specimen collection/handling, submission of specimen other than nasopharyngeal swab, presence of viral mutation(s) within the areas targeted by this assay, and inadequate number of viral copies (<131 copies/mL). A negative result must be combined with clinical observations, patient history, and epidemiological information. The expected result is Negative.  Fact Sheet for Patients:  https://www.moore.com/  Fact Sheet for Healthcare Providers:  https://www.young.biz/  This test is no t yet  approved or cleared by the [place] FDA and  has been authorized for detection and/or diagnosis of SARS-CoV-2 by FDA under an Emergency Use Authorization (EUA). This EUA will remain  in effect (meaning this test can be used) for the duration of the COVID-19 declaration under Section 564(b)(1) of the Act, 21 U.S.C. section 360bbb-3(b)(1), unless the authorization is terminated or revoked sooner.     Influenza A by PCR NEGATIVE NEGATIVE Final   Influenza B by PCR NEGATIVE NEGATIVE Final    Comment: (NOTE) The Xpert Xpress SARS-CoV-2/FLU/RSV assay is intended as an aid in  the diagnosis of influenza from Nasopharyngeal swab specimens and  should not be used as a sole basis for treatment. Nasal washings and  aspirates are unacceptable for Xpert Xpress SARS-CoV-2/FLU/RSV  testing.  Fact Sheet for Patients: https://www.moore.com/  Fact Sheet for Healthcare Providers: https://www.young.biz/  This test is not yet approved or cleared by the [place] FDA and  has been authorized for detection and/or diagnosis of SARS-CoV-2 by  FDA under an Emergency Use Authorization (EUA). This EUA will remain  in effect (meaning this test can be used) for the duration of the  Covid-19 declaration under Section 564(b)(1) of the Act, 21  U.S.C. section 360bbb-3(b)(1), unless the authorization is  terminated or revoked. Performed at [facility], [address], [place], [place] [address]      Time coordinating discharge:  SIGNED:   Erick Blinks, MD  [facility] 12/10/2019, 8:56 PM   If 7PM-7AM, please contact night-coverage [email or web address]

## 2019-12-13 ENCOUNTER — Other Ambulatory Visit: Payer: Self-pay

## 2019-12-13 ENCOUNTER — Telehealth (HOSPITAL_COMMUNITY): Payer: Self-pay | Admitting: Physical Therapy

## 2019-12-13 ENCOUNTER — Encounter (HOSPITAL_COMMUNITY): Payer: Self-pay

## 2019-12-13 ENCOUNTER — Encounter (HOSPITAL_COMMUNITY): Payer: Self-pay | Admitting: Physical Therapy

## 2019-12-13 ENCOUNTER — Ambulatory Visit (HOSPITAL_COMMUNITY): Payer: Medicare (Managed Care) | Admitting: Physical Therapy

## 2019-12-13 NOTE — Telephone Encounter (Signed)
Pt showed for 2:00 appointment at 1:13.  She became upset at 2:05 pm and stated that she was leaving as she was told that she had a 1:15 appointment.  Front office staff tried to explain that her appointment was at 2:00 and she would be called back shortly, however, pt opted to leave.  Therapist called pt to apologize and let her know that there was a 3:30 appointment available if she desired.  Pt states that she would not be coming back and she would take care of it herself.  Therapist let pt know that if she desired to return in the future we have the order here and she can call back and make an appointment.    Virgina Organ, PT CLT [phone]

## 2019-12-19 ENCOUNTER — Ambulatory Visit (HOSPITAL_COMMUNITY): Payer: Medicare (Managed Care) | Admitting: Physical Therapy

## 2019-12-19 ENCOUNTER — Telehealth (HOSPITAL_COMMUNITY): Payer: Self-pay | Admitting: Physical Therapy

## 2019-12-19 NOTE — Telephone Encounter (Signed)
pt called to reschedule this appt due to she states that her car rider has a flat tire. pt has rescheduled this appt two times

## 2019-12-25 ENCOUNTER — Telehealth (INDEPENDENT_AMBULATORY_CARE_PROVIDER_SITE_OTHER): Payer: Self-pay | Admitting: Nurse Practitioner

## 2019-12-25 ENCOUNTER — Other Ambulatory Visit: Payer: Self-pay

## 2019-12-25 ENCOUNTER — Encounter (INDEPENDENT_AMBULATORY_CARE_PROVIDER_SITE_OTHER): Payer: Self-pay | Admitting: Nurse Practitioner

## 2019-12-25 ENCOUNTER — Ambulatory Visit (INDEPENDENT_AMBULATORY_CARE_PROVIDER_SITE_OTHER): Payer: Medicare (Managed Care) | Admitting: Nurse Practitioner

## 2019-12-25 VITALS — BP 154/72 | HR 68 | Temp 97.3°F | Ht 63.0 in | Wt 294.2 lb

## 2019-12-25 DIAGNOSIS — K625 Hemorrhage of anus and rectum: Secondary | ICD-10-CM

## 2019-12-25 DIAGNOSIS — Z23 Encounter for immunization: Secondary | ICD-10-CM | POA: Diagnosis not present

## 2019-12-25 DIAGNOSIS — I2510 Atherosclerotic heart disease of native coronary artery without angina pectoris: Secondary | ICD-10-CM

## 2019-12-25 DIAGNOSIS — R5383 Other fatigue: Secondary | ICD-10-CM

## 2019-12-25 DIAGNOSIS — I509 Heart failure, unspecified: Secondary | ICD-10-CM

## 2019-12-25 DIAGNOSIS — E669 Obesity, unspecified: Secondary | ICD-10-CM

## 2019-12-25 DIAGNOSIS — F32A Depression, unspecified: Secondary | ICD-10-CM

## 2019-12-25 DIAGNOSIS — I10 Essential (primary) hypertension: Secondary | ICD-10-CM

## 2019-12-25 DIAGNOSIS — E1169 Type 2 diabetes mellitus with other specified complication: Secondary | ICD-10-CM

## 2019-12-25 DIAGNOSIS — E785 Hyperlipidemia, unspecified: Secondary | ICD-10-CM | POA: Diagnosis not present

## 2019-12-25 DIAGNOSIS — J449 Chronic obstructive pulmonary disease, unspecified: Secondary | ICD-10-CM

## 2019-12-25 MED ORDER — FUROSEMIDE 80 MG PO TABS: 80.0000 mg | ORAL_TABLET | Freq: Every day | ORAL | 0 refills | Status: DC

## 2019-12-25 MED ORDER — GLIPIZIDE 10 MG PO TABS: 10.0000 mg | ORAL_TABLET | Freq: Two times a day (BID) | ORAL | 0 refills | Status: DC

## 2019-12-25 MED ORDER — CLOPIDOGREL BISULFATE 75 MG PO TABS: 75.0000 mg | ORAL_TABLET | Freq: Every day | ORAL | 0 refills | Status: DC

## 2019-12-25 MED ORDER — PANTOPRAZOLE SODIUM 40 MG PO TBEC: 40.0000 mg | DELAYED_RELEASE_TABLET | Freq: Every day | ORAL | 0 refills | Status: DC

## 2019-12-25 MED ORDER — DULOXETINE HCL 60 MG PO CPEP: 60.0000 mg | ORAL_CAPSULE | Freq: Two times a day (BID) | ORAL | 0 refills | Status: DC

## 2019-12-25 MED ORDER — METOPROLOL TARTRATE 50 MG PO TABS: 50.0000 mg | ORAL_TABLET | Freq: Two times a day (BID) | ORAL | 0 refills | Status: DC

## 2019-12-25 MED ORDER — BUPROPION HCL ER (SR) 100 MG PO TB12: 100.0000 mg | ORAL_TABLET | Freq: Two times a day (BID) | ORAL | 0 refills | Status: DC

## 2019-12-25 NOTE — Progress Notes (Signed)
Subjective:  Patient ID: Nicole [name], female    DOB: 09-08-1965  Age: 54 y.o. MRN: [id number]  CC:  Chief Complaint  Patient presents with  . Establish Care    Doing okay  . Coronary Artery Disease  . Congestive Heart Failure  . COPD  . Diabetes  . Hypertension  . Fatigue  . Depression  . Other    Due for vaccines, rectal bleeding      HPI  This patient arrives today for the above.  She is establishing care at this office today as she has newly moved to the area from [place].  She used to live here a few years back and has recently moved back to be closer to family.  Coronary artery disease, congestive heart failure, hypertension, and atrial fibrillation: She has a history of coronary artery disease with history of stent placement and myocardial infarction.  She also has history of congestive heart failure and hypertension.  She continues on Eliquis, clopidogrel, furosemide, metoprolol, simvastatin, and lisinopril.  She is tolerating these medications fairly well.  She is not established with a cardiologist in this area.  She tells me that despite taking 80 mg of furosemide daily she continues to experience bilateral lower extremity swelling.  She is not on any potassium supplementation.  Last blood work collected approximately 2 weeks ago while she was in the hospital for treatment of cellulitis did show normal potassium level.  COPD, due for vaccinations: She has has a history of COPD and she is oxygen dependent.  She tells me that she does have a oxygen concentrator at home but does not have portable oxygen concentrator and needs this because she feels quite out of breath with any activity.  She is due for flu and pneumonia vaccines today.  She also continues on albuterol as needed.  Diabetes: She is insulin-dependent, and takes Levemir twice a day as well as NovoLog 30 to 60 units 3 times daily with meals and she has a Novolin sliding scale.  Tells me she has not been using  the Novolin sliding scale for a few days because her blood sugars have been running lower than her normal.  She tells me before moving to [place] her blood sugars were frequently in the 3-4 100s.  She tells me last A1c from her previous doctor was above 12.  Since moving back here she tells me her blood sugars range more in the low 100s to mid 200s.  She checks her blood sugar fasting as well as before meals and before bedtime.  She tells me she will take 30 units of her NovoLog with meals if blood sugar is less than 250.  She is not established with an endocrinologist in the area.  In addition to her insulin she continues on glipizide and Trulicity.  Fatigue: She also expressed that she frequently feels tired during the day.  Depression: She has a history of depression and is currently on Wellbutrin, citalopram, and duloxetine.  She is not established with psychiatry in the area.  She feels that her mood is fairly stable and she is been on these medications for years.  Rectal bleeding: She mentions that approximately 9 months ago she started noticing small amounts of bright red blood per rectum.  She tells me she was scheduled to have colonoscopy for further evaluation in [place] prior to moving to [place].  Unfortunately before having the procedure she was exposed to COVID-19, and the procedure was  canceled.  She then subsequently moved so she has not had evaluation.  She is not sure if she ever has had a history of hemorrhoids, but thinks at one point someone may have mentioned that she has a hemorrhoid in the past.   Past Medical History:  Diagnosis Date  . CAP (community acquired pneumonia)    Streptococcus 01/2011  . COPD (chronic obstructive pulmonary disease) ([place])   . Coronary atherosclerosis of native coronary artery    Diagnosed [place] 2006 - DES RCA, reports followup cath 2009 at [facility]   . Dyslipidemia   . Essential hypertension, benign   . Morbid obesity ([place])   .  Pancreatitis   . Type 2 diabetes mellitus ([facility])       Family History  Problem Relation Age of Onset  . Diabetes Mother   . Heart failure Mother   . Hypertension Mother   . Kidney failure Mother   . Ovarian cancer Mother   . Asthma Son     Social History   Social History Narrative  . Not on file   Social History   Tobacco Use  . Smoking status: Current Every Day Smoker    Packs/day: 0.50    Years: 20.00    Pack years: 10.00    Types: Cigarettes  . Smokeless tobacco: Never Used  Substance Use Topics  . Alcohol use: No     Current Meds  Medication Sig  . albuterol (PROVENTIL) (2.5 MG/3ML) 0.083% nebulizer solution Take 3 mLs (2.5 mg total) by nebulization every 6 (six) hours as needed for wheezing.  Marland Kitchen albuterol (VENTOLIN HFA) 108 (90 BASE) MCG/ACT inhaler Inhale 2 puffs into the lungs as needed for wheezing. Shortness of breath  . amoxicillin-clavulanate (AUGMENTIN) 875-125 MG tablet Take 1 tablet by mouth every 12 (twelve) hours.  Marland Kitchen apixaban (ELIQUIS) 5 MG TABS tablet Take 5 mg by mouth 2 (two) times daily.  Marland Kitchen buPROPion (WELLBUTRIN SR) 100 MG 12 hr tablet Take 1 tablet (100 mg total) by mouth 2 (two) times daily.  . citalopram (CELEXA) 10 MG tablet Take 10 mg by mouth daily.  . clopidogrel (PLAVIX) 75 MG tablet Take 1 tablet (75 mg total) by mouth daily.  Marland Kitchen doxycycline (VIBRA-TABS) 100 MG tablet Take 1 tablet (100 mg total) by mouth every 12 (twelve) hours.  . DULoxetine (CYMBALTA) 60 MG capsule Take 1 capsule (60 mg total) by mouth 2 (two) times daily.  Marland Kitchen EPINEPHrine 0.3 mg/0.3 mL IJ SOAJ injection Inject 0.3 mg into the muscle as needed for anaphylaxis.  . furosemide (LASIX) 80 MG tablet Take 1 tablet (80 mg total) by mouth daily.  Marland Kitchen glipiZIDE (GLUCOTROL) 10 MG tablet Take 1 tablet (10 mg total) by mouth in the morning and at bedtime.  . insulin aspart (NOVOLOG) 100 UNIT/ML injection Inject 60 Units into the skin 3 (three) times daily with meals.   . insulin detemir  (LEVEMIR) 100 UNIT/ML injection Inject 100 Units into the skin 2 (two) times daily.   . insulin regular (NOVOLIN R) 100 units/mL injection Inject 100 Units into the skin See admin instructions. 0-20 per sliding scale  . lisinopril (ZESTRIL) 2.5 MG tablet Take 2.5 mg by mouth daily.  Marland Kitchen loperamide (IMODIUM) 2 MG capsule Take 1 capsule (2 mg total) by mouth every 6 (six) hours as needed for diarrhea or loose stools.  . metoprolol tartrate (LOPRESSOR) 50 MG tablet Take 1 tablet (50 mg total) by mouth 2 (two) times daily.  . Multiple Vitamins-Calcium (ONE-A-DAY WOMENS  FORMULA) TABS Take 1 tablet by mouth daily.  . mupirocin ointment (BACTROBAN) 2 % Apply topically daily.  . pantoprazole (PROTONIX) 40 MG tablet Take 1 tablet (40 mg total) by mouth daily. Take 30-60 min before first meal of the day  . simvastatin (ZOCOR) 80 MG tablet Take 80 mg by mouth daily.  . TRULICITY 1.5 VU/1.3HY SOPN SMARTSIG:0.5 Milliliter(s) SUB-Q Once a Week  . [DISCONTINUED] buPROPion (WELLBUTRIN SR) 100 MG 12 hr tablet Take 100 mg by mouth 2 (two) times daily.  . [DISCONTINUED] citalopram (CELEXA) 10 MG tablet Take 10 mg by mouth daily.  . [DISCONTINUED] clopidogrel (PLAVIX) 75 MG tablet Take 75 mg by mouth daily.    . [DISCONTINUED] DULoxetine (CYMBALTA) 60 MG capsule Take 60 mg by mouth 2 (two) times daily.  . [DISCONTINUED] furosemide (LASIX) 80 MG tablet Take 80 mg by mouth daily.  . [DISCONTINUED] glipiZIDE (GLUCOTROL) 10 MG tablet Take 10 mg by mouth in the morning and at bedtime.  . [DISCONTINUED] metoprolol (LOPRESSOR) 50 MG tablet Take 50 mg by mouth 2 (two) times daily.    . [DISCONTINUED] pantoprazole (PROTONIX) 40 MG tablet Take 1 tablet (40 mg total) by mouth daily. Take 30-60 min before first meal of the day    ROS:  In addition to the symptoms listed in HPI patient also reports thinning hair and loose stools.   Objective:   Today's Vitals: BP (!) 154/72   Pulse 68   Temp (!) 97.3 F (36.3 C)  (Temporal)   Ht _0  (1.6 m)   Wt 294 lb 3.2 oz (133.4 kg)   SpO2 93%   BMI 52.12 kg/m  Vitals with BMI 12/25/2019 12/10/2019 12/10/2019  Height _1  - -  Weight 294 lbs 3 oz - -  BMI 38.88 - -  Systolic 757 972 820  Diastolic 72 57 52  Pulse 68 65 60     Physical Exam Vitals reviewed.  Constitutional:      General: She is not in acute distress.    Appearance: Normal appearance. She is obese.  HENT:     Head: Normocephalic and atraumatic.  Neck:     Vascular: No carotid bruit.  Cardiovascular:     Rate and Rhythm: Normal rate and regular rhythm.     Pulses: Normal pulses.     Heart sounds: Normal heart sounds.  Pulmonary:     Effort: Pulmonary effort is normal.     Breath sounds: Normal breath sounds.  Skin:    General: Skin is warm and dry.  Neurological:     General: No focal deficit present.     Mental Status: She is alert and oriented to person, place, and time.  Psychiatric:        Mood and Affect: Mood normal.        Behavior: Behavior normal.        Judgment: Judgment normal.     Walking saturations were performed today. At rest on RA patients oxygen saturation was 90%. Walking on RA patient's oxygen saturation dropped to 83%. Walking on 2lpm of oxygen via [place] patient's oxygen saturation improved to 93%.     Assessment and Plan   1. Atherosclerosis of native coronary artery of native heart, unspecified whether angina present   2. HTN (hypertension), benign   3. Chronic obstructive pulmonary disease, unspecified COPD type ([place])   4. Dyslipidemia   5. Morbid obesity ([place])   6. Diabetes mellitus type 2 in obese ([place])   7. Congestive  heart failure, unspecified HF chronicity, unspecified heart failure type ([place])   8. Depression, unspecified depression type   9. Fatigue, unspecified type   10. Rectal bleeding   11. Pneumococcal vaccine administered   12. Needs flu shot      Plan: 1.-2.,  4.,  7.  We will refer patient to cardiology for further  assistance with evaluating and managing his chronic conditions. 3.  Will order portable oxygen concentrator for her. Will administer flu and pneumonia shot today. 5.-6.,  9.  We will collect blood work for further evaluation today.  Will refer her to endocrinology for assistance with managing her diabetes. 8.  She will continue on her current medications as prescribed and we will refer to psychiatry for further assistance with managing her depression. 10.  Will refer to gastroenterology for further evaluation and management of the rectal bleeding. 11.,  12.  We will administer pneumonia and flu shot today.  Tests ordered Orders Placed This Encounter  Procedures  . Flu Vaccine QUAD 6+ mos PF IM (Fluarix Quad PF)  . Pneumococcal polysaccharide vaccine 23-valent greater than or equal to 2yo subcutaneous/IM  . Hemoglobin A1c  . CBC with Differential/Platelets  . CMP with eGFR([facility])  . Lipid Panel  . TSH  . T3, Free  . T4, Free  . Ambulatory referral to Cardiology  . Ambulatory referral to Psychiatry  . Ambulatory referral to Endocrinology  . Ambulatory referral to Gastroenterology      Meds ordered this encounter  Medications  . DULoxetine (CYMBALTA) 60 MG capsule    Sig: Take 1 capsule (60 mg total) by mouth 2 (two) times daily.    Dispense:  90 capsule    Refill:  0    Order Specific Question:   Supervising Provider    Answer:   Hurshel Party C [[id number]]  . buPROPion (WELLBUTRIN SR) 100 MG 12 hr tablet    Sig: Take 1 tablet (100 mg total) by mouth 2 (two) times daily.    Dispense:  180 tablet    Refill:  0    Order Specific Question:   Supervising Provider    Answer:   Hurshel Party C [[id number]]  . glipiZIDE (GLUCOTROL) 10 MG tablet    Sig: Take 1 tablet (10 mg total) by mouth in the morning and at bedtime.    Dispense:  180 tablet    Refill:  0    Order Specific Question:   Supervising Provider    Answer:   Hurshel Party C [[id number]]  . furosemide (LASIX) 80 MG tablet     Sig: Take 1 tablet (80 mg total) by mouth daily.    Dispense:  90 tablet    Refill:  0    Order Specific Question:   Supervising Provider    Answer:   Hurshel Party C [[id number]]  . clopidogrel (PLAVIX) 75 MG tablet    Sig: Take 1 tablet (75 mg total) by mouth daily.    Dispense:  90 tablet    Refill:  0    Order Specific Question:   Supervising Provider    Answer:   Hurshel Party C [[id number]]  . metoprolol tartrate (LOPRESSOR) 50 MG tablet    Sig: Take 1 tablet (50 mg total) by mouth 2 (two) times daily.    Dispense:  180 tablet    Refill:  0    Order Specific Question:   Supervising Provider    Answer:   Hurshel Party C [[id number]]  . pantoprazole (PROTONIX)  40 MG tablet    Sig: Take 1 tablet (40 mg total) by mouth daily. Take 30-60 min before first meal of the day    Dispense:  90 tablet    Refill:  0    Order Specific Question:   Supervising Provider    Answer:   Doree Albee [[id number]]    Patient to follow-up in 1 month or sooner as needed.  Ailene Ards, NP

## 2019-12-25 NOTE — Telephone Encounter (Signed)
Megan, I have ordered at-home oxygen for this patient. She prefers to [facility] from [facility]. Per [name], we just send a note in epic to our representative from [facility]. I am not sure who our rep is so I have also attached [name] to this note in case she knows the person we contact. Please send the rep a message in epic so that they know to look and proceed with setting this patient up with portable oxygen tank. Thank you.

## 2019-12-25 NOTE — Patient Instructions (Signed)
Influenza (Flu) Vaccine (Inactivated or Recombinant): What You Need to Know 1. Why get vaccinated? Influenza vaccine can prevent influenza (flu). Flu is a contagious disease that spreads around the [place] every year, usually between October and May. Anyone can get the flu, but it is more dangerous for some people. Infants and young children, people 55 years of age and older, pregnant women, and people with certain health conditions or a weakened immune system are at greatest risk of flu complications. Pneumonia, bronchitis, sinus infections and ear infections are examples of flu-related complications. If you have a medical condition, such as heart disease, cancer or diabetes, flu can make it worse. Flu can cause fever and chills, sore throat, muscle aches, fatigue, cough, headache, and runny or stuffy nose. Some people may have vomiting and diarrhea, though this is more common in children than adults. Each year thousands of people in the [place] die from flu, and many more are hospitalized. Flu vaccine prevents millions of illnesses and flu-related visits to the doctor each year. 2. Influenza vaccine CDC recommends everyone 55 months of age and older get vaccinated every flu season. Children 6 months through 2 years of age may need 2 doses during a single flu season. Everyone else needs only 1 dose each flu season. It takes about 2 weeks for protection to develop after vaccination. There are many flu viruses, and they are always changing. Each year a new flu vaccine is made to protect against three or four viruses that are likely to cause disease in the upcoming flu season. Even when the vaccine doesn't exactly match these viruses, it may still provide some protection. Influenza vaccine does not cause flu. Influenza vaccine may be given at the same time as other vaccines. 3. Talk with your health care provider Tell your vaccine provider if the person getting the vaccine:  Has had an  allergic reaction after a previous dose of influenza vaccine, or has any severe, life-threatening allergies.  Has ever had Guillain-Barr Syndrome (also called GBS). In some cases, your health care provider may decide to postpone influenza vaccination to a future visit. People with minor illnesses, such as a cold, may be vaccinated. People who are moderately or severely ill should usually wait until they recover before getting influenza vaccine. Your health care provider can give you more information. 4. Risks of a vaccine reaction  Soreness, redness, and swelling where shot is given, fever, muscle aches, and headache can happen after influenza vaccine.  There may be a very small increased risk of Guillain-Barr Syndrome (GBS) after inactivated influenza vaccine (the flu shot). Young children who get the flu shot along with pneumococcal vaccine (PCV13), and/or DTaP vaccine at the same time might be slightly more likely to have a seizure caused by fever. Tell your health care provider if a child who is getting flu vaccine has ever had a seizure. People sometimes faint after medical procedures, including vaccination. Tell your provider if you feel dizzy or have vision changes or ringing in the ears. As with any medicine, there is a very remote chance of a vaccine causing a severe allergic reaction, other serious injury, or death. 5. What if there is a serious problem? An allergic reaction could occur after the vaccinated person leaves the clinic. If you see signs of a severe allergic reaction (hives, swelling of the face and throat, difficulty breathing, a fast heartbeat, dizziness, or weakness), call 9-1-1 and get the person to the nearest hospital. For other signs that  concern you, call your health care provider. Adverse reactions should be reported to the Vaccine Adverse Event Reporting System (VAERS). Your health care provider will usually file this report, or you can do it yourself. Visit the  VAERS website at www.vaers.hhs.gov or call 1-800-822-7967.VAERS is only for reporting reactions, and VAERS staff do not give medical advice. 6. The National Vaccine Injury Compensation Program The National Vaccine Injury Compensation Program (VICP) is a federal program that was created to compensate people who may have been injured by certain vaccines. Visit the VICP website at www.hrsa.gov/vaccinecompensation or call 1-800-338-2382 to learn about the program and about filing a claim. There is a time limit to file a claim for compensation. 7. How can I learn more?  Ask your healthcare provider.  Call your local or state health department.  Contact the Centers for Disease Control and Prevention (CDC): ? Call 1-800-232-4636 (1-800-CDC-INFO) or ? Visit CDC's www.cdc.gov/flu Vaccine Information Statement (Interim) Inactivated Influenza Vaccine (10/06/2017) This information is not intended to replace advice given to you by your health care provider. Make sure you discuss any questions you have with your health care provider. Document Revised: 05/30/2018 Document Reviewed: 10/10/2017 Elsevier Patient Education  2020 Elsevier Inc. Pneumococcal Polysaccharide Vaccine (PPSV23): What You Need to Know 1. Why get vaccinated? Pneumococcal polysaccharide vaccine (PPSV23) can prevent pneumococcal disease. Pneumococcal disease refers to any illness caused by pneumococcal bacteria. These bacteria can cause many types of illnesses, including pneumonia, which is an infection of the lungs. Pneumococcal bacteria are one of the most common causes of pneumonia. Besides pneumonia, pneumococcal bacteria can also cause:  Ear infections  Sinus infections  Meningitis (infection of the tissue covering the brain and spinal cord)  Bacteremia (bloodstream infection) Anyone can get pneumococcal disease, but children under 2 years of age, people with certain medical conditions, adults 65 years or older, and cigarette  smokers are at the highest risk. Most pneumococcal infections are mild. However, some can result in long-term problems, such as brain damage or hearing loss. Meningitis, bacteremia, and pneumonia caused by pneumococcal disease can be fatal. 2. PPSV23 PPSV23 protects against 23 types of bacteria that cause pneumococcal disease. PPSV23 is recommended for:  All adults 65 years or older,  Anyone 2 years or older with certain medical conditions that can lead to an increased risk for pneumococcal disease. Most people need only one dose of PPSV23. A second dose of PPSV23, and another type of pneumococcal vaccine called PCV13, are recommended for certain high-risk groups. Your health care provider can give you more information. People 65 years or older should get a dose of PPSV23 even if they have already gotten one or more doses of the vaccine before they turned 65. 3. Talk with your health care provider Tell your vaccine provider if the person getting the vaccine:  Has had an allergic reaction after a previous dose of PPSV23, or has any severe, life-threatening allergies. In some cases, your health care provider may decide to postpone PPSV23 vaccination to a future visit. People with minor illnesses, such as a cold, may be vaccinated. People who are moderately or severely ill should usually wait until they recover before getting PPSV23. Your health care provider can give you more information. 4. Risks of a vaccine reaction  Redness or pain where the shot is given, feeling tired, fever, or muscle aches can happen after PPSV23. People sometimes faint after medical procedures, including vaccination. Tell your provider if you feel dizzy or have vision changes or ringing in   the ears. As with any medicine, there is a very remote chance of a vaccine causing a severe allergic reaction, other serious injury, or death. 5. What if there is a serious problem? An allergic reaction could occur after the  vaccinated person leaves the clinic. If you see signs of a severe allergic reaction (hives, swelling of the face and throat, difficulty breathing, a fast heartbeat, dizziness, or weakness), call 9-1-1 and get the person to the nearest hospital. For other signs that concern you, call your health care provider. Adverse reactions should be reported to the Vaccine Adverse Event Reporting System (VAERS). Your health care provider will usually file this report, or you can do it yourself. Visit the VAERS website at www.vaers.LAg[place].no or call 862 140 2661. VAERS is only for reporting reactions, and VAERS staff do not give medical advice. 6. How can I learn more?  Ask your health care provider.  Call your local or state health department.  Contact the Centers for Disease Control and Prevention (CDC): ? Call 5794055664 (1-800-CDC-INFO) or ? Visit CDC's website at PicCapture.uy CDC Vaccine Information Statement PPSV23 Vaccine (12/21/2017) This information is not intended to replace advice given to you by your health care provider. Make sure you discuss any questions you have with your health care provider. Document Revised: 05/30/2018 Document Reviewed: 09/20/2017 Elsevier Patient Education  2020 [employer].

## 2019-12-26 LAB — CBC WITH DIFFERENTIAL/PLATELET
Absolute Monocytes: 775 cells/uL (ref 200–950)
Basophils Absolute: 86 cells/uL (ref 0–200)
Basophils Relative: 0.7 %
Eosinophils Absolute: 221 cells/uL (ref 15–500)
Eosinophils Relative: 1.8 %
HCT: 38.2 % (ref 35.0–45.0)
Hemoglobin: 11.9 g/dL (ref 11.7–15.5)
Lymphs Abs: 2718 cells/uL (ref 850–3900)
MCH: 29.8 pg (ref 27.0–33.0)
MCHC: 31.2 g/dL — ABNORMAL LOW (ref 32.0–36.0)
MCV: 95.5 fL (ref 80.0–100.0)
MPV: 10.4 fL (ref 7.5–12.5)
Monocytes Relative: 6.3 %
Neutro Abs: 8499 cells/uL — ABNORMAL HIGH (ref 1500–7800)
Neutrophils Relative %: 69.1 %
Platelets: 290 10*3/uL (ref 140–400)
RBC: 4 10*6/uL (ref 3.80–5.10)
RDW: 12.9 % (ref 11.0–15.0)
Total Lymphocyte: 22.1 %
WBC: 12.3 10*3/uL — ABNORMAL HIGH (ref 3.8–10.8)

## 2019-12-26 LAB — COMPLETE METABOLIC PANEL WITH GFR
AG Ratio: 1.3 (calc) (ref 1.0–2.5)
ALT: 10 U/L (ref 6–29)
AST: 17 U/L (ref 10–35)
Albumin: 3.7 g/dL (ref 3.6–5.1)
Alkaline phosphatase (APISO): 85 U/L (ref 37–153)
BUN: 15 mg/dL (ref 7–25)
CO2: 35 mmol/L — ABNORMAL HIGH (ref 20–32)
Calcium: 9.6 mg/dL (ref 8.6–10.4)
Chloride: 98 mmol/L (ref 98–110)
Creat: 0.92 mg/dL (ref 0.50–1.05)
GFR, Est African American: 81 mL/min/{1.73_m2} (ref >=60)
GFR, Est Non African American: 70 mL/min/{1.73_m2} (ref >=60)
Globulin: 2.9 g/dL (calc) (ref 1.9–3.7)
Glucose, Bld: 269 mg/dL — ABNORMAL HIGH (ref 65–99)
Potassium: 4.4 mmol/L (ref 3.5–5.3)
Sodium: 143 mmol/L (ref 135–146)
Total Bilirubin: 0.4 mg/dL (ref 0.2–1.2)
Total Protein: 6.6 g/dL (ref 6.1–8.1)

## 2019-12-26 LAB — LIPID PANEL
Cholesterol: 117 mg/dL (ref <200)
HDL: 25 mg/dL — ABNORMAL LOW (ref >=50)
LDL Cholesterol (Calc): 65 mg/dL (calc)
Non-HDL Cholesterol (Calc): 92 mg/dL (calc) (ref <130)
Total CHOL/HDL Ratio: 4.7 (calc) (ref <5.0)
Triglycerides: 208 mg/dL — ABNORMAL HIGH (ref <150)

## 2019-12-26 LAB — HEMOGLOBIN A1C
Hgb A1c MFr Bld: 9.6 % of total Hgb — ABNORMAL HIGH (ref <5.7)
Mean Plasma Glucose: 229 (calc)
eAG (mmol/L): 12.7 (calc)

## 2019-12-26 LAB — T3, FREE: T3, Free: 2.9 pg/mL (ref 2.3–4.2)

## 2019-12-26 LAB — T4, FREE: Free T4: 1.2 ng/dL (ref 0.8–1.8)

## 2019-12-26 LAB — TSH: TSH: 2.06 mIU/L

## 2020-01-08 ENCOUNTER — Other Ambulatory Visit: Payer: Self-pay

## 2020-01-08 ENCOUNTER — Ambulatory Visit (HOSPITAL_COMMUNITY): Payer: Medicare (Managed Care) | Attending: Internal Medicine | Admitting: Physical Therapy

## 2020-01-08 DIAGNOSIS — E11621 Type 2 diabetes mellitus with foot ulcer: Secondary | ICD-10-CM | POA: Insufficient documentation

## 2020-01-08 DIAGNOSIS — L97425 Non-pressure chronic ulcer of left heel and midfoot with muscle involvement without evidence of necrosis: Secondary | ICD-10-CM | POA: Insufficient documentation

## 2020-01-08 DIAGNOSIS — M79672 Pain in left foot: Secondary | ICD-10-CM | POA: Insufficient documentation

## 2020-01-08 NOTE — Therapy (Signed)
[facility] [address] [place], [place], [address] Phone: [phone]   Fax:  [phone]  Wound Care Evaluation  Patient Details  Name: Nicole [name] MRN: [id number] Date of Birth: 23-May-1965 Referring Provider (PT): Erick Blinks   Encounter Date: 01/08/2020   PT End of Session - 01/08/20 1432    Visit Number 1    Number of Visits 18    Date for PT Re-Evaluation 03/04/20    Authorization Type [employer]    Authorization - Visit Number 1    Authorization - Number of Visits 18    Progress Note Due on Visit 10    PT Start Time 1515    PT Stop Time 1400    PT Time Calculation (min) 1365 min    Activity Tolerance Patient tolerated treatment well    Behavior During Therapy [facility] for tasks assessed/performed           Past Medical History:  Diagnosis Date  . CAP (community acquired pneumonia)    Streptococcus 01/2011  . COPD (chronic obstructive pulmonary disease) (HCC)   . Coronary atherosclerosis of native coronary artery    Diagnosed [place] 2006 - DES RCA, reports followup cath 2009 at [facility]   . Dyslipidemia   . Essential hypertension, benign   . Morbid obesity (HCC)   . Pancreatitis   . Type 2 diabetes mellitus (HCC)     Past Surgical History:  Procedure Laterality Date  . ABDOMINAL HERNIA REPAIR    . ABDOMINAL HYSTERECTOMY    . CORONARY ANGIOPLASTY WITH STENT PLACEMENT  2006  . KNEE SURGERY    . NOSE SURGERY    . Pilonidal cystectomy    . TONSILLECTOMY      There were no vitals filed for this visit.     [facility] PT Assessment - 01/08/20 0001      Assessment   Medical Diagnosis Diabetc foot ulcer LT    Referring Provider (PT) Durward Mallard Memon    Onset Date/Surgical Date 11/07/19    Next MD Visit --   unknown pt does not have an MD following her foot    Prior Therapy self care and hospitalization       Precautions   Precautions --   cellulitis     Restrictions   Weight Bearing Restrictions --   theapist  recommends off loading with [facility]     Balance Screen   Has the patient fallen in the past 6 months No    Has the patient had a decrease in activity level because of a fear of falling?  Yes    Is the patient reluctant to leave their home because of a fear of falling?  No      Home Environment   Living Environment Private residence      Prior Function   Level of Independence Independent           Wound Therapy - 01/08/20 0001    Subjective Pt states that she had her wound for about two weeks and noted a foul smell, went to the ER and was admitted for IV antibiotics.  She was to come to this facility immediately after discharge but felt that she could take care of the wound on her own.  The wound is not healing therefore she opted to come in for treatment.     Patient and Family Stated Goals wound to heal     Date of Onset 11/07/19    Prior Treatments hospitalized  10/13-10/18.    Pain Scale 0-10    Pain Score 5     Pain Type Chronic pain    Pain Location Foot    Pain Orientation Left    Pain Descriptors / Indicators Aching    Pain Onset With Activity    Patients Stated Pain Goal 0    Multiple Pain Sites No    Evaluation and Treatment Procedures Explained to Patient/Family Yes    Evaluation and Treatment Procedures agreed to    Wound Properties Date First Assessed: 01/08/20 Time First Assessed: 1321 Wound Type: Diabetic ulcer Location: Foot Location Orientation: Left Wound Description (Comments): plantar aspect of 1st MCP head.  Present on Admission: Yes   Dressing Type None    Dressing Changed New    Dressing Status None    Dressing Change Frequency PRN    Site / Wound Assessment Clean;Dry;Pale;Yellow    % Wound base Yellow/Fibrinous Exudate 50%    % Wound base Other/Granulation Tissue (Comment) 50%    Peri-wound Assessment Edema;Erythema (blanchable)   callous    Wound Length (cm) 1.5 cm    Wound Width (cm) 1.2 cm    Wound Depth (cm) 0.2 cm    Wound Volume (cm^3) 0.36 cm^3      Wound Surface Area (cm^2) 1.8 cm^2    Drainage Amount Scant    Treatment Cleansed;Debridement (Selective)    Wound Properties Date First Assessed: 01/08/20 Time First Assessed: 1329 Wound Type: Laceration Location: Foot Location Orientation: Left Wound Description (Comments): inferior aspect of callous  Present on Admission: Yes   Dressing Type None    Dressing Changed New    Dressing Change Frequency PRN    % Wound base Red or Granulating 0%    % Wound base Other/Granulation Tissue (Comment) --   callous   Peri-wound Assessment Other (Comment)    Wound Length (cm) 2.4 cm    Wound Width (cm) 0.3 cm    Wound Depth (cm) 0.5 cm    Wound Volume (cm^3) 0.36 cm^3    Wound Surface Area (cm^2) 0.72 cm^2    Drainage Amount None    Treatment Cleansed    Selective Debridement - Location wound bed and callous surrounding wound     Selective Debridement - Tools Used Forceps;Scissors    Selective Debridement - Tissue Removed callous and slough     Wound Therapy - Clinical Statement Nicole [name] is a 54 yo female with a complicated medical history of uncontrolled DM,(pt states blood sugar is in the low 200's), CHF, COPD, HTN, A-fib  and previous amputation of pt left little toe who comes with a referral following discharge from the hospital for wound care.  The pt has noted induration in her LE, dry calloused feet with swelling in her toes, foot and ankle,  Her wound is surrounded by a callous and the inferior end of the callous has an open slit exposing soft tissue.  Nicole [name] will benefit from skilled PT to create a healing environment for her wounds and debride the non viable tissue including the callous away, encouraging keeping her sugars at a reasonable level, off loading her LE as well as compression bandaging to reduce the swelling and induration to make healing of the wound a possibility.     Wound Therapy - Functional Problem List Pt should not be weight bearing on the wound area she was given  a Darco boot this treatment.    Factors Delaying/Impairing Wound Healing Altered sensation;Diabetes Mellitus;Infection - systemic/local;Immobility;Multiple medical  problems;Polypharmacy;Tobacco use;Vascular compromise    Hydrotherapy Plan Debridement;Dressing change;Patient/family education    Wound Therapy - Frequency 3X / week   x 2 weeks then 2x a week for 6 weeks    Wound Therapy - Current Recommendations PT;Other (comment)   PT requested pt obtain a podiatrist to follow wound    Wound Plan PT to be seen in this clinic for 3x a wee x 2 weeks followed by 2x a week for 6 weeks for debridement, proper dressing and compression bandaging inclucing toes.  Compression bandaging was not completed this session due to pt wearing tight jeans.  Therapist explained what dressing would be put on next visit and requested loosely fitting clothing;     Dressing  medihoney to both MCP wound and open area at distal callous f/b 4x4 and kerlix.                  Objective measurements completed on examination: See above findings.             PT Education - 01/08/20 1426    Education Details PT needs to keep wt off of the wound, ( pt was given a darco shoe), she needs to keep her blood sugar at a reasonable level, she has swelling in her leg therefore a compression bandage system will be used next treatment so wear loose clothing, and that she needs to keep the wound covered.    Person(s) Educated Patient    Methods Explanation    Comprehension Verbalized understanding            PT Short Term Goals - 01/08/20 1439      PT SHORT TERM GOAL #1   Title PT wound to be 100% granulated    Time 5    Period Weeks    Status New    Target Date 02/05/20      PT SHORT TERM GOAL #2   Title Pt to understand the benefit to keeping edema in B LE controlled and to be using compression garment on Rt LE    Time 4    Period Weeks    Status New      PT SHORT TERM GOAL #3   Title PT wound to have  decreased in size by 50%    Time 4    Period Weeks    Status New             PT Long Term Goals - 01/08/20 1441      PT LONG TERM GOAL #1   Title Callous surrounding wound to have been removed    Time 8    Period Weeks    Status New    Target Date 03/04/20      PT LONG TERM GOAL #2   Title Both wounds on plantar aspect of Lt foot to be healed    Time 8    Period Weeks    Status New      PT LONG TERM GOAL #3   Title PT to be wearing compression garment on both LE    Time 8    Period Weeks    Status New                Plan - 01/08/20 1435    Clinical Impression Statement see above    Personal Factors and Comorbidities Comorbidity 3+;Fitness;Past/Current Experience;Time since onset of injury/illness/exacerbation    Comorbidities DM, CHF, HTN, AFIB, COPD    Examination-Activity Limitations Dressing;Locomotion Level  Examination-Participation Restrictions Community Activity    Stability/Clinical Decision Making Evolving/Moderate complexity    Clinical Decision Making Moderate    Rehab Potential Fair    PT Frequency 3x / week   for 2 weeks then 2xs for 6   PT Duration 8 weeks    PT Treatment/Interventions ADLs/Self Care Home Management;Manual techniques;Other (comment);Patient/family education   debridement   PT Next Visit Plan begin profore compression dressing, see if she is 1)using Darco boot; 2) looking for a podiatrist, 3) controlling her sugars; 4 speak to pt about compression garment for Rt LE and the need to use on both once wound has healed.           Patient will benefit from skilled therapeutic intervention in order to improve the following deficits and impairments:  Decreased skin integrity, Difficulty walking, Pain, Obesity, Increased edema  Visit Diagnosis: Diabetic ulcer of left midfoot associated with type 2 diabetes mellitus, with muscle involvement without evidence of necrosis (HCC)  Pain in left foot    Problem List Patient Active  Problem List   Diagnosis Date Noted  . Diabetic ulcer of left foot (HCC) 12/05/2019  . Cough 05/26/2012  . TIA (transient ischemic attack) 04/10/2012  . Acute diastolic congestive heart failure (HCC) 11/12/2011  . COPD (chronic obstructive pulmonary disease) (HCC) 11/09/2011  . Dyspnea 06/10/2011  . CAP (community acquired pneumonia) 02/19/2011  . Coronary atherosclerosis of native coronary artery 02/19/2011  . Dyslipidemia   . Morbid obesity (HCC)   . Diabetes mellitus type 2 in obese (HCC)   . HTN (hypertension), benign    Virgina OrganCynthia Shailee Foots, [place] CLT [phone] 01/08/2020, 2:46 PM  [facility] [address] [place], [place], [address] Phone: [phone]   Fax:  [phone]  Name: Nat MathLisa M Weyandt MRN: [id number] Date of Birth: 09/07/1965

## 2020-01-10 ENCOUNTER — Ambulatory Visit: Payer: Medicare (Managed Care) | Admitting: "Endocrinology

## 2020-01-22 ENCOUNTER — Ambulatory Visit (HOSPITAL_COMMUNITY): Payer: Medicare (Managed Care) | Admitting: Physical Therapy

## 2020-01-22 ENCOUNTER — Encounter (HOSPITAL_COMMUNITY): Payer: Self-pay | Admitting: Emergency Medicine

## 2020-01-22 ENCOUNTER — Other Ambulatory Visit: Payer: Self-pay

## 2020-01-22 ENCOUNTER — Encounter (HOSPITAL_COMMUNITY): Payer: Self-pay | Admitting: Physical Therapy

## 2020-01-22 ENCOUNTER — Inpatient Hospital Stay (HOSPITAL_COMMUNITY)
Admission: EM | Admit: 2020-01-22 | Discharge: 2020-02-06 | DRG: 853 | Disposition: A | Discharge status: Home Health | Payer: Medicare (Managed Care) | Attending: Internal Medicine | Admitting: Internal Medicine

## 2020-01-22 ENCOUNTER — Emergency Department (HOSPITAL_COMMUNITY): Payer: Medicare (Managed Care)

## 2020-01-22 DIAGNOSIS — Z955 Presence of coronary angioplasty implant and graft: Secondary | ICD-10-CM | POA: Diagnosis not present

## 2020-01-22 DIAGNOSIS — G4700 Insomnia, unspecified: Secondary | ICD-10-CM | POA: Diagnosis present

## 2020-01-22 DIAGNOSIS — T8140XA Infection following a procedure, unspecified, initial encounter: Secondary | ICD-10-CM | POA: Diagnosis not present

## 2020-01-22 DIAGNOSIS — Z841 Family history of disorders of kidney and ureter: Secondary | ICD-10-CM

## 2020-01-22 DIAGNOSIS — M86272 Subacute osteomyelitis, left ankle and foot: Secondary | ICD-10-CM | POA: Diagnosis not present

## 2020-01-22 DIAGNOSIS — I110 Hypertensive heart disease with heart failure: Secondary | ICD-10-CM | POA: Diagnosis present

## 2020-01-22 DIAGNOSIS — R6520 Severe sepsis without septic shock: Secondary | ICD-10-CM | POA: Diagnosis not present

## 2020-01-22 DIAGNOSIS — L03116 Cellulitis of left lower limb: Secondary | ICD-10-CM | POA: Diagnosis present

## 2020-01-22 DIAGNOSIS — J9621 Acute and chronic respiratory failure with hypoxia: Secondary | ICD-10-CM | POA: Diagnosis not present

## 2020-01-22 DIAGNOSIS — L97421 Non-pressure chronic ulcer of left heel and midfoot limited to breakdown of skin: Secondary | ICD-10-CM

## 2020-01-22 DIAGNOSIS — A419 Sepsis, unspecified organism: Principal | ICD-10-CM | POA: Diagnosis present

## 2020-01-22 DIAGNOSIS — Z7901 Long term (current) use of anticoagulants: Secondary | ICD-10-CM

## 2020-01-22 DIAGNOSIS — M869 Osteomyelitis, unspecified: Secondary | ICD-10-CM

## 2020-01-22 DIAGNOSIS — L97529 Non-pressure chronic ulcer of other part of left foot with unspecified severity: Secondary | ICD-10-CM | POA: Diagnosis present

## 2020-01-22 DIAGNOSIS — Z6841 Body Mass Index (BMI) 40.0 and over, adult: Secondary | ICD-10-CM | POA: Diagnosis not present

## 2020-01-22 DIAGNOSIS — E8809 Other disorders of plasma-protein metabolism, not elsewhere classified: Secondary | ICD-10-CM | POA: Diagnosis not present

## 2020-01-22 DIAGNOSIS — D649 Anemia, unspecified: Secondary | ICD-10-CM | POA: Diagnosis present

## 2020-01-22 DIAGNOSIS — Z720 Tobacco use: Secondary | ICD-10-CM | POA: Diagnosis not present

## 2020-01-22 DIAGNOSIS — Z90710 Acquired absence of both cervix and uterus: Secondary | ICD-10-CM

## 2020-01-22 DIAGNOSIS — L97422 Non-pressure chronic ulcer of left heel and midfoot with fat layer exposed: Secondary | ICD-10-CM | POA: Diagnosis not present

## 2020-01-22 DIAGNOSIS — R6510 Systemic inflammatory response syndrome (SIRS) of non-infectious origin without acute organ dysfunction: Secondary | ICD-10-CM | POA: Diagnosis not present

## 2020-01-22 DIAGNOSIS — E785 Hyperlipidemia, unspecified: Secondary | ICD-10-CM | POA: Diagnosis present

## 2020-01-22 DIAGNOSIS — Z888 Allergy status to other drugs, medicaments and biological substances status: Secondary | ICD-10-CM

## 2020-01-22 DIAGNOSIS — E11621 Type 2 diabetes mellitus with foot ulcer: Secondary | ICD-10-CM | POA: Diagnosis present

## 2020-01-22 DIAGNOSIS — I2510 Atherosclerotic heart disease of native coronary artery without angina pectoris: Secondary | ICD-10-CM | POA: Diagnosis present

## 2020-01-22 DIAGNOSIS — E1169 Type 2 diabetes mellitus with other specified complication: Secondary | ICD-10-CM | POA: Diagnosis present

## 2020-01-22 DIAGNOSIS — I5032 Chronic diastolic (congestive) heart failure: Secondary | ICD-10-CM | POA: Diagnosis present

## 2020-01-22 DIAGNOSIS — Z886 Allergy status to analgesic agent status: Secondary | ICD-10-CM

## 2020-01-22 DIAGNOSIS — L97424 Non-pressure chronic ulcer of left heel and midfoot with necrosis of bone: Secondary | ICD-10-CM | POA: Diagnosis not present

## 2020-01-22 DIAGNOSIS — D72829 Elevated white blood cell count, unspecified: Secondary | ICD-10-CM | POA: Insufficient documentation

## 2020-01-22 DIAGNOSIS — M86072 Acute hematogenous osteomyelitis, left ankle and foot: Secondary | ICD-10-CM

## 2020-01-22 DIAGNOSIS — E66813 Obesity, class 3: Secondary | ICD-10-CM

## 2020-01-22 DIAGNOSIS — Z8041 Family history of malignant neoplasm of ovary: Secondary | ICD-10-CM

## 2020-01-22 DIAGNOSIS — I482 Chronic atrial fibrillation, unspecified: Secondary | ICD-10-CM | POA: Diagnosis present

## 2020-01-22 DIAGNOSIS — L97425 Non-pressure chronic ulcer of left heel and midfoot with muscle involvement without evidence of necrosis: Secondary | ICD-10-CM

## 2020-01-22 DIAGNOSIS — T797XXA Traumatic subcutaneous emphysema, initial encounter: Secondary | ICD-10-CM | POA: Diagnosis present

## 2020-01-22 DIAGNOSIS — J449 Chronic obstructive pulmonary disease, unspecified: Secondary | ICD-10-CM | POA: Diagnosis present

## 2020-01-22 DIAGNOSIS — Z794 Long term (current) use of insulin: Secondary | ICD-10-CM | POA: Diagnosis not present

## 2020-01-22 DIAGNOSIS — Z833 Family history of diabetes mellitus: Secondary | ICD-10-CM

## 2020-01-22 DIAGNOSIS — Z20822 Contact with and (suspected) exposure to covid-19: Secondary | ICD-10-CM | POA: Diagnosis present

## 2020-01-22 DIAGNOSIS — E669 Obesity, unspecified: Secondary | ICD-10-CM | POA: Diagnosis not present

## 2020-01-22 DIAGNOSIS — R772 Abnormality of alphafetoprotein: Secondary | ICD-10-CM | POA: Insufficient documentation

## 2020-01-22 DIAGNOSIS — G4733 Obstructive sleep apnea (adult) (pediatric): Secondary | ICD-10-CM | POA: Diagnosis present

## 2020-01-22 DIAGNOSIS — M86172 Other acute osteomyelitis, left ankle and foot: Secondary | ICD-10-CM

## 2020-01-22 DIAGNOSIS — M79672 Pain in left foot: Secondary | ICD-10-CM

## 2020-01-22 DIAGNOSIS — Z7902 Long term (current) use of antithrombotics/antiplatelets: Secondary | ICD-10-CM

## 2020-01-22 DIAGNOSIS — E1165 Type 2 diabetes mellitus with hyperglycemia: Secondary | ICD-10-CM | POA: Diagnosis not present

## 2020-01-22 DIAGNOSIS — I25119 Atherosclerotic heart disease of native coronary artery with unspecified angina pectoris: Secondary | ICD-10-CM | POA: Diagnosis present

## 2020-01-22 DIAGNOSIS — I10 Essential (primary) hypertension: Secondary | ICD-10-CM | POA: Diagnosis present

## 2020-01-22 DIAGNOSIS — F17210 Nicotine dependence, cigarettes, uncomplicated: Secondary | ICD-10-CM | POA: Diagnosis present

## 2020-01-22 DIAGNOSIS — E876 Hypokalemia: Secondary | ICD-10-CM

## 2020-01-22 DIAGNOSIS — E11628 Type 2 diabetes mellitus with other skin complications: Secondary | ICD-10-CM | POA: Diagnosis present

## 2020-01-22 DIAGNOSIS — Z825 Family history of asthma and other chronic lower respiratory diseases: Secondary | ICD-10-CM

## 2020-01-22 DIAGNOSIS — E08621 Diabetes mellitus due to underlying condition with foot ulcer: Secondary | ICD-10-CM

## 2020-01-22 DIAGNOSIS — K219 Gastro-esophageal reflux disease without esophagitis: Secondary | ICD-10-CM | POA: Diagnosis present

## 2020-01-22 DIAGNOSIS — Z8249 Family history of ischemic heart disease and other diseases of the circulatory system: Secondary | ICD-10-CM

## 2020-01-22 DIAGNOSIS — Z7984 Long term (current) use of oral hypoglycemic drugs: Secondary | ICD-10-CM

## 2020-01-22 DIAGNOSIS — Z91018 Allergy to other foods: Secondary | ICD-10-CM

## 2020-01-22 DIAGNOSIS — E43 Unspecified severe protein-calorie malnutrition: Secondary | ICD-10-CM | POA: Diagnosis present

## 2020-01-22 DIAGNOSIS — F32A Depression, unspecified: Secondary | ICD-10-CM | POA: Diagnosis present

## 2020-01-22 DIAGNOSIS — J9601 Acute respiratory failure with hypoxia: Secondary | ICD-10-CM | POA: Diagnosis not present

## 2020-01-22 DIAGNOSIS — Z79899 Other long term (current) drug therapy: Secondary | ICD-10-CM

## 2020-01-22 DIAGNOSIS — Z91030 Bee allergy status: Secondary | ICD-10-CM

## 2020-01-22 DIAGNOSIS — F419 Anxiety disorder, unspecified: Secondary | ICD-10-CM | POA: Diagnosis present

## 2020-01-22 LAB — CBC WITH DIFFERENTIAL/PLATELET
Abs Immature Granulocytes: 0.34 10*3/uL — ABNORMAL HIGH (ref 0.00–0.07)
Basophils Absolute: 0.1 10*3/uL (ref 0.0–0.1)
Basophils Relative: 1 %
Eosinophils Absolute: 0.4 10*3/uL (ref 0.0–0.5)
Eosinophils Relative: 2 %
HCT: 35.4 % — ABNORMAL LOW (ref 36.0–46.0)
Hemoglobin: 11 g/dL — ABNORMAL LOW (ref 12.0–15.0)
Immature Granulocytes: 2 %
Lymphocytes Relative: 15 %
Lymphs Abs: 2.8 10*3/uL (ref 0.7–4.0)
MCH: 29.4 pg (ref 26.0–34.0)
MCHC: 31.1 g/dL (ref 30.0–36.0)
MCV: 94.7 fL (ref 80.0–100.0)
Monocytes Absolute: 1.5 10*3/uL — ABNORMAL HIGH (ref 0.1–1.0)
Monocytes Relative: 8 %
Neutro Abs: 14.1 10*3/uL — ABNORMAL HIGH (ref 1.7–7.7)
Neutrophils Relative %: 72 %
Platelets: 339 10*3/uL (ref 150–400)
RBC: 3.74 MIL/uL — ABNORMAL LOW (ref 3.87–5.11)
RDW: 14.4 % (ref 11.5–15.5)
WBC: 19.2 10*3/uL — ABNORMAL HIGH (ref 4.0–10.5)
nRBC: 0 % (ref 0.0–0.2)

## 2020-01-22 LAB — COMPREHENSIVE METABOLIC PANEL
ALT: 30 U/L (ref 0–44)
AST: 37 U/L (ref 15–41)
Albumin: 2.5 g/dL — ABNORMAL LOW (ref 3.5–5.0)
Alkaline Phosphatase: 155 U/L — ABNORMAL HIGH (ref 38–126)
Anion gap: 12 (ref 5–15)
BUN: 16 mg/dL (ref 6–20)
CO2: 29 mmol/L (ref 22–32)
Calcium: 8.5 mg/dL — ABNORMAL LOW (ref 8.9–10.3)
Chloride: 94 mmol/L — ABNORMAL LOW (ref 98–111)
Creatinine, Ser: 0.88 mg/dL (ref 0.44–1.00)
GFR, Estimated: >60 mL/min (ref >60)
Glucose, Bld: 371 mg/dL — ABNORMAL HIGH (ref 70–99)
Potassium: 3.2 mmol/L — ABNORMAL LOW (ref 3.5–5.1)
Sodium: 135 mmol/L (ref 135–145)
Total Bilirubin: 0.8 mg/dL (ref 0.3–1.2)
Total Protein: 7.4 g/dL (ref 6.5–8.1)

## 2020-01-22 LAB — LACTIC ACID, PLASMA: Lactic Acid, Venous: 1.6 mmol/L (ref 0.5–1.9)

## 2020-01-22 LAB — URINALYSIS, ROUTINE W REFLEX MICROSCOPIC
Bilirubin Urine: NEGATIVE
Glucose, UA: 150 mg/dL — AB
Hgb urine dipstick: NEGATIVE
Ketones, ur: NEGATIVE mg/dL
Leukocytes,Ua: NEGATIVE
Nitrite: NEGATIVE
Protein, ur: >=300 mg/dL — AB
Specific Gravity, Urine: 1.024 (ref 1.005–1.030)
pH: 5 (ref 5.0–8.0)

## 2020-01-22 LAB — CBG MONITORING, ED: Glucose-Capillary: 252 mg/dL — ABNORMAL HIGH (ref 70–99)

## 2020-01-22 LAB — RESP PANEL BY RT-PCR (FLU A&B, COVID) ARPGX2
Influenza A by PCR: NEGATIVE
Influenza B by PCR: NEGATIVE
SARS Coronavirus 2 by RT PCR: NEGATIVE

## 2020-01-22 LAB — GLUCOSE, CAPILLARY: Glucose-Capillary: 332 mg/dL — ABNORMAL HIGH (ref 70–99)

## 2020-01-22 MED ORDER — VANCOMYCIN HCL IN DEXTROSE 1-5 GM/200ML-% IV SOLN
1000.0000 mg | Freq: Once | INTRAVENOUS | Status: AC
  Administered 2020-01-22: 1000 mg via INTRAVENOUS
  Filled 2020-01-22: qty 200

## 2020-01-22 MED ORDER — INSULIN ASPART 100 UNIT/ML ~~LOC~~ SOLN
0.0000 [IU] | SUBCUTANEOUS | Status: DC
  Administered 2020-01-22: 8 [IU] via SUBCUTANEOUS
  Administered 2020-01-23: 5 [IU] via SUBCUTANEOUS
  Administered 2020-01-23: 8 [IU] via SUBCUTANEOUS
  Administered 2020-01-23: 11 [IU] via SUBCUTANEOUS
  Administered 2020-01-24: 8 [IU] via SUBCUTANEOUS
  Administered 2020-01-24 (×2): 5 [IU] via SUBCUTANEOUS
  Administered 2020-01-24: 15 [IU] via SUBCUTANEOUS
  Administered 2020-01-24: 5 [IU] via SUBCUTANEOUS
  Administered 2020-01-24: 11 [IU] via SUBCUTANEOUS
  Administered 2020-01-25: 8 [IU] via SUBCUTANEOUS
  Administered 2020-01-25: 5 [IU] via SUBCUTANEOUS
  Administered 2020-01-25: 11 [IU] via SUBCUTANEOUS
  Administered 2020-01-25 (×2): 15 [IU] via SUBCUTANEOUS
  Administered 2020-01-25: 2 [IU] via SUBCUTANEOUS
  Administered 2020-01-26 (×4): 5 [IU] via SUBCUTANEOUS
  Administered 2020-01-26: 15 [IU] via SUBCUTANEOUS
  Administered 2020-01-26: 5 [IU] via SUBCUTANEOUS
  Administered 2020-01-27 (×2): 3 [IU] via SUBCUTANEOUS
  Administered 2020-01-27: 8 [IU] via SUBCUTANEOUS
  Administered 2020-01-27: 5 [IU] via SUBCUTANEOUS
  Administered 2020-01-27: 11 [IU] via SUBCUTANEOUS
  Administered 2020-01-28 (×3): 5 [IU] via SUBCUTANEOUS
  Administered 2020-01-28: 8 [IU] via SUBCUTANEOUS
  Administered 2020-01-28: 15 [IU] via SUBCUTANEOUS
  Administered 2020-01-28: 5 [IU] via SUBCUTANEOUS
  Administered 2020-01-29: 8 [IU] via SUBCUTANEOUS
  Administered 2020-01-29: 11 [IU] via SUBCUTANEOUS
  Administered 2020-01-29 (×2): 3 [IU] via SUBCUTANEOUS
  Administered 2020-01-29 – 2020-01-30 (×2): 5 [IU] via SUBCUTANEOUS
  Administered 2020-01-30: 8 [IU] via SUBCUTANEOUS
  Administered 2020-01-30: 3 [IU] via SUBCUTANEOUS
  Administered 2020-01-30: 8 [IU] via SUBCUTANEOUS
  Filled 2020-01-22: qty 1

## 2020-01-22 MED ORDER — OXYCODONE HCL 5 MG PO TABS
5.0000 mg | ORAL_TABLET | ORAL | Status: DC | PRN
  Administered 2020-01-25 – 2020-01-26 (×4): 5 mg via ORAL
  Filled 2020-01-22 (×4): qty 1

## 2020-01-22 MED ORDER — PIPERACILLIN-TAZOBACTAM 3.375 G IVPB
3.3750 g | Freq: Three times a day (TID) | INTRAVENOUS | Status: AC
  Administered 2020-01-23 – 2020-01-26 (×12): 3.375 g via INTRAVENOUS
  Filled 2020-01-22 (×12): qty 50

## 2020-01-22 MED ORDER — ALBUTEROL SULFATE (2.5 MG/3ML) 0.083% IN NEBU: 2.5000 mg | INHALATION_SOLUTION | Freq: Four times a day (QID) | RESPIRATORY_TRACT | Status: DC | PRN

## 2020-01-22 MED ORDER — LISINOPRIL 5 MG PO TABS
2.5000 mg | ORAL_TABLET | Freq: Every day | ORAL | Status: DC
  Administered 2020-01-23 – 2020-02-06 (×14): 2.5 mg via ORAL
  Filled 2020-01-22 (×14): qty 1

## 2020-01-22 MED ORDER — ALBUTEROL SULFATE HFA 108 (90 BASE) MCG/ACT IN AERS
2.0000 | INHALATION_SPRAY | RESPIRATORY_TRACT | Status: DC | PRN
  Filled 2020-01-22: qty 6.7

## 2020-01-22 MED ORDER — PIPERACILLIN-TAZOBACTAM 3.375 G IVPB 30 MIN
3.3750 g | Freq: Four times a day (QID) | INTRAVENOUS | Status: DC
Start: 2020-01-23 — End: 2020-01-22

## 2020-01-22 MED ORDER — PIPERACILLIN-TAZOBACTAM 3.375 G IVPB 30 MIN
3.3750 g | Freq: Once | INTRAVENOUS | Status: AC
  Administered 2020-01-22: 3.375 g via INTRAVENOUS
  Filled 2020-01-22: qty 50

## 2020-01-22 MED ORDER — METOPROLOL TARTRATE 50 MG PO TABS
50.0000 mg | ORAL_TABLET | Freq: Two times a day (BID) | ORAL | Status: DC
  Administered 2020-01-22 – 2020-02-06 (×30): 50 mg via ORAL
  Filled 2020-01-22 (×30): qty 1

## 2020-01-22 MED ORDER — PANTOPRAZOLE SODIUM 40 MG PO TBEC
40.0000 mg | DELAYED_RELEASE_TABLET | Freq: Every day | ORAL | Status: DC
  Administered 2020-01-23 – 2020-02-03 (×11): 40 mg via ORAL
  Filled 2020-01-22 (×12): qty 1

## 2020-01-22 MED ORDER — OXYCODONE-ACETAMINOPHEN 5-325 MG PO TABS: 1.0000 | ORAL_TABLET | ORAL | Status: DC | PRN

## 2020-01-22 MED ORDER — VANCOMYCIN HCL 1250 MG/250ML IV SOLN
1250.0000 mg | Freq: Two times a day (BID) | INTRAVENOUS | Status: AC
  Administered 2020-01-23 – 2020-01-27 (×8): 1250 mg via INTRAVENOUS
  Filled 2020-01-22 (×8): qty 250

## 2020-01-22 MED ORDER — ATORVASTATIN CALCIUM 40 MG PO TABS
40.0000 mg | ORAL_TABLET | Freq: Every day | ORAL | Status: DC
  Administered 2020-01-23 – 2020-02-06 (×15): 40 mg via ORAL
  Filled 2020-01-22 (×15): qty 1

## 2020-01-22 MED ORDER — GLUCERNA SHAKE PO LIQD
237.0000 mL | Freq: Three times a day (TID) | ORAL | Status: DC
  Filled 2020-01-22 (×5): qty 237

## 2020-01-22 MED ORDER — POTASSIUM CHLORIDE 20 MEQ PO PACK
40.0000 meq | PACK | Freq: Once | ORAL | Status: AC
  Administered 2020-01-22: 40 meq via ORAL
  Filled 2020-01-22: qty 2

## 2020-01-22 NOTE — ED Triage Notes (Signed)
Pt c/o left foot pain for over a month. Pt states the pain is moving to her leg.

## 2020-01-22 NOTE — H&P (Addendum)
History and Physical  Nicole [name] XBJ:[id number] DOB: 07-12-1965 DOA: 01/22/2020  Referring physician: Garald Balding, PA-C PCP: Doree Albee, MD  Patient coming from: Home  Chief Complaint: Left foot pain  HPI: Nicole [name] is a 54 y.o. female with medical history significant for COPD, hyperlipidemia, hypertension, morbid obesity, type 2 diabetes mellitus insulin-dependent, CAD s/p PCI, chronic diastolic CHF, A. fib on Eliquis, OSA on CPAP who presents to the emergency department due to worsening left foot infection.  Patient was admitted from 10/13-10/18 due to left foot diabetic ulcer with associated cellulitis during which she was treated with IV antibiotics which was transitioned to oral antibiotics on discharge, she was referred to outpatient PT for wound care and was set up with podiatry as an outpatient.  She states that she has been compliant with wound care visits including today and that when foot was unwrapped, it was noted that her infection looks significantly worse.  She was asked to go to the ED for further evaluation and management.  Patient states that she completed outpatient antibiotics prescribed, she denies fever, chills, palpitation, chest pain or shortness of breath.  ED Course:  In the emergency department, she was tachypneic and tachycardic.  Work-up in the ED showed leukocytosis, hypokalemia, hypoalbuminemia, hyperglycemia and elevated ALP-155. Left foot x-ray showed extensive soft tissue swelling and subcutaneous emphysema throughout the left foot, compatible with infection. Cortical irregularity lateral margin distal tuft first distal phalanx, consistent with osteomyelitis.  She was started on IV Zosyn, potassium was replenished. Hospitalist was asked to admit patient for further evaluation and management.  Review of Systems: Constitutional: Negative for chills and fever.  HENT: Negative for ear pain and sore throat.   Eyes: Negative for pain and visual  disturbance.  Respiratory: Negative for cough, chest tightness and shortness of breath.   Cardiovascular: Negative for chest pain and palpitations.  Gastrointestinal: Negative for abdominal pain and vomiting.  Endocrine: Negative for polyphagia and polyuria.  Genitourinary: Negative for decreased urine volume, dysuria Musculoskeletal: Positive for worsening wound with inflammation and redness of left foot.  Skin: Positive for color change  surrounding the left foot wound. Allergic/Immunologic: Negative for immunocompromised state.  Neurological: Negative for tremors, syncope, speech difficulty, weakness, light-headedness and headaches.  Hematological: Does not bruise/bleed easily.  All other systems reviewed and are negative    Past Medical History:  Diagnosis Date  . CAP (community acquired pneumonia)    Streptococcus 01/2011  . COPD (chronic obstructive pulmonary disease) ([place])   . Coronary atherosclerosis of native coronary artery    Diagnosed [place] 2006 - DES RCA, reports followup cath 2009 at [facility]   . Dyslipidemia   . Essential hypertension, benign   . Morbid obesity ([place])   . Pancreatitis   . Type 2 diabetes mellitus ([place])    Past Surgical History:  Procedure Laterality Date  . ABDOMINAL HERNIA REPAIR    . ABDOMINAL HYSTERECTOMY    . CORONARY ANGIOPLASTY WITH STENT PLACEMENT  2006  . KNEE SURGERY    . NOSE SURGERY    . Pilonidal cystectomy    . TONSILLECTOMY      Social History:  reports that she has been smoking cigarettes. She has a 10.00 pack-year smoking history. She has never used smokeless tobacco. She reports that she does not drink alcohol and does not use drugs.   Allergies  Allergen Reactions  . Aspirin Anaphylaxis and Other (See Comments)    Throat closing   . Bee Pollen Anaphylaxis  .  Bee Venom Anaphylaxis  . Food Anaphylaxis    PINEAPPLE  . Pineapple Anaphylaxis  . E-Mycin [Erythromycin Base] Nausea And Vomiting  . Other Rash    Family  History  Problem Relation Age of Onset  . Diabetes Mother   . Heart failure Mother   . Hypertension Mother   . Kidney failure Mother   . Ovarian cancer Mother   . Asthma Son     Prior to Admission medications   Medication Sig Start Date End Date Taking? Authorizing Provider  albuterol (PROVENTIL) (2.5 MG/3ML) 0.083% nebulizer solution Take 3 mLs (2.5 mg total) by nebulization every 6 (six) hours as needed for wheezing. 05/25/12  Yes Tanda Rockers, MD  albuterol (VENTOLIN HFA) 108 (90 BASE) MCG/ACT inhaler Inhale 2 puffs into the lungs as needed for wheezing. Shortness of breath   Yes [provider]  apixaban (ELIQUIS) 5 MG TABS tablet Take 5 mg by mouth 2 (two) times daily.   Yes [provider]  buPROPion (WELLBUTRIN SR) 100 MG 12 hr tablet Take 1 tablet (100 mg total) by mouth 2 (two) times daily. 12/25/19  Yes Ailene Ards, NP  citalopram (CELEXA) 10 MG tablet Take 10 mg by mouth daily.   Yes [provider]  clopidogrel (PLAVIX) 75 MG tablet Take 1 tablet (75 mg total) by mouth daily. 12/25/19  Yes Ailene Ards, NP  DULoxetine (CYMBALTA) 60 MG capsule Take 1 capsule (60 mg total) by mouth 2 (two) times daily. 12/25/19  Yes Ailene Ards, NP  EPINEPHrine 0.3 mg/0.3 mL IJ SOAJ injection Inject 0.3 mg into the muscle as needed for anaphylaxis.   Yes [provider]  furosemide (LASIX) 80 MG tablet Take 1 tablet (80 mg total) by mouth daily. 12/25/19  Yes Ailene Ards, NP  glipiZIDE (GLUCOTROL) 10 MG tablet Take 1 tablet (10 mg total) by mouth in the morning and at bedtime. 12/25/19  Yes Ailene Ards, NP  insulin aspart (NOVOLOG) 100 UNIT/ML injection Inject 30 Units into the skin 3 (three) times daily with meals.    Yes [provider]  insulin regular (NOVOLIN R) 100 units/mL injection Inject 100 Units into the skin See admin instructions. 0-20 per sliding scale   Yes [provider]  lisinopril (ZESTRIL) 2.5 MG tablet Take 2.5 mg by  mouth daily.   Yes [provider]  loperamide (IMODIUM) 2 MG capsule Take 1 capsule (2 mg total) by mouth every 6 (six) hours as needed for diarrhea or loose stools. 12/10/19  Yes Kathie Dike, MD  metoprolol tartrate (LOPRESSOR) 50 MG tablet Take 1 tablet (50 mg total) by mouth 2 (two) times daily. 12/25/19  Yes Ailene Ards, NP  Multiple Vitamins-Calcium (ONE-A-DAY WOMENS FORMULA) TABS Take 1 tablet by mouth daily.   Yes [provider]  mupirocin ointment (BACTROBAN) 2 % Apply topically daily. 12/10/19  Yes Kathie Dike, MD  pantoprazole (PROTONIX) 40 MG tablet Take 1 tablet (40 mg total) by mouth daily. Take 30-60 min before first meal of the day 12/25/19  Yes Ailene Ards, NP  simvastatin (ZOCOR) 80 MG tablet Take 80 mg by mouth daily.   Yes [provider]  TRULICITY 1.5 OM/3.5DH SOPN Inject 1.5 mg into the skin once a week.  11/05/19  Yes [provider]  amoxicillin-clavulanate (AUGMENTIN) 875-125 MG tablet Take 1 tablet by mouth every 12 (twelve) hours. Patient not taking: Reported on 01/22/2020 12/10/19   Kathie Dike, MD  doxycycline (VIBRA-TABS)  100 MG tablet Take 1 tablet (100 mg total) by mouth every 12 (twelve) hours. Patient not taking: Reported on 01/22/2020 12/10/19   Kathie Dike, MD  EASY [facility] INSULIN SYRINGE 31G X 5/16" 1 ML MISC  09/21/19   [provider]  insulin detemir (LEVEMIR) 100 UNIT/ML injection Inject 100 Units into the skin 2 (two) times daily.     [provider]  SUPREP BOWEL PREP KIT 17.5-3.13-1.6 GM/177ML SOLN SMARTSIG:354 Milliliter(s) By Mouth As Directed Patient not taking: Reported on 12/25/2019 08/31/19   [provider]    Physical Exam: BP (!) 133/58   Pulse (!) 111   Temp 98.2 F (36.8 C) (Oral)   Resp (!) 28   Ht $R'5\' 3"'bq$  (1.6 m)   Wt 131.5 kg   SpO2 95%   BMI 51.37 kg/m   . General: 54 y.o. year-old female obese and in no acute distress.  Alert and oriented x3. Marland Kitchen HEENT:  NCAT, EOMI . Neck: Supple, trachea medial . Cardiovascular: Regular rate and rhythm with no rubs or gallops.  No thyromegaly or JVD noted.  No lower extremity edema. 2/4 pulses in all 4 extremities. Marland Kitchen Respiratory: Clear to auscultation with no wheezes or rales. Good inspiratory effort. . Abdomen: Soft nontender nondistended with normal bowel sounds x4 quadrants. . Muskuloskeletal: Extensive soft tissue swelling and erythema of left foot with surrounding yellowish crust.  Diabetic ulcer wound on the plantar surface of left foot.  . Neuro: CN II-XII intact, strength, sensation, reflexes . Skin: Erythema and swelling of left foot.  Ulcerative lesions on plantar surface of left foot.   Marland Kitchen Psychiatry: Judgement and insight appear normal. Mood is appropriate for condition and setting          Labs on Admission:  Basic Metabolic Panel: Recent Labs  Lab 01/22/20 1701  NA 135  K 3.2*  CL 94*  CO2 29  GLUCOSE 371*  BUN 16  CREATININE 0.88  CALCIUM 8.5*   Liver Function Tests: Recent Labs  Lab 01/22/20 1701  AST 37  ALT 30  ALKPHOS 155*  BILITOT 0.8  PROT 7.4  ALBUMIN 2.5*   No results for input(s): LIPASE, AMYLASE in the last 168 hours. No results for input(s): AMMONIA in the last 168 hours. CBC: Recent Labs  Lab 01/22/20 1701  WBC 19.2*  NEUTROABS 14.1*  HGB 11.0*  HCT 35.4*  MCV 94.7  PLT 339   Cardiac Enzymes: No results for input(s): CKTOTAL, CKMB, CKMBINDEX, TROPONINI in the last 168 hours.  BNP (last 3 results) No results for input(s): BNP in the last 8760 hours.  ProBNP (last 3 results) No results for input(s): PROBNP in the last 8760 hours.  CBG: Recent Labs  Lab 01/22/20 1513  GLUCAP 332*    Radiological Exams on Admission: DG Foot Complete Left  Result Date: 01/22/2020 CLINICAL DATA:  Left great toe and first metatarsal infection, diabetes, swelling EXAM: LEFT FOOT - COMPLETE 3+ VIEW COMPARISON:  12/05/2019 FINDINGS: Frontal, oblique, lateral views  of the left foot are obtained. There is extensive soft tissue swelling throughout the left foot. Subcutaneous gas is seen throughout the left forefoot, greatest between the first and second metatarsal heads. There is cortical irregularity along the lateral margin of the distal tuft of the first distal phalanx concerning for osteomyelitis. No other acute or destructive bony lesions. Prior fifth digit AP taken at the metatarsophalangeal joint. IMPRESSION: 1. Extensive soft tissue swelling and subcutaneous emphysema throughout the left foot, compatible with infection. 2.  Cortical irregularity lateral margin distal tuft first distal phalanx, consistent with osteomyelitis. Electronically Signed   By: Randa Ngo M.D.   On: 01/22/2020 15:51    EKG: I independently viewed the EKG done and my findings are as followed: EKG was not done in the ED  Assessment/Plan Present on Admission: . Cellulitis of left foot . Morbid obesity ([place]) . Diabetes mellitus type 2 in obese ([place]) . HTN (hypertension), benign . COPD (chronic obstructive pulmonary disease) ([place]) . Diabetic ulcer of left foot ([place])  Principal Problem:   Osteomyelitis of left foot ([place]) Active Problems:   Morbid obesity ([place])   Diabetes mellitus type 2 in obese (HCC)   HTN (hypertension), benign   COPD (chronic obstructive pulmonary disease) (HCC)   Diabetic ulcer of left foot (HCC)   Cellulitis of left foot   Leukocytosis   Hypokalemia   Hypoalbuminemia   Hyperglycemia due to diabetes mellitus (HCC)   SIRS (systemic inflammatory response syndrome) (HCC)  Left foot diabetic ulcer with osteomyelitis and associated cellulitis Left foot x-ray showed extensive soft tissue swelling and subcutaneous emphysema throughout the left foot, compatible with infection. Cortical irregularity lateral margin distal tuft first distal phalanx, consistent with osteomyelitis.  Patient was started on IV Zosyn, we shall continue with IV Vanco and Zosyn   General surgery will be consulted for possible bedside debridement Wound care will be consulted for wound care recommendation Continue oxycodone 5 mg p.o. every 4 hours as needed for moderate/severe pain  Leukocytosis possibly secondary to above WBC 19.2 (worse elevation from prior labs) Continue management as per above Continue to monitor WBC with morning labs  SIRS with suspicion for sepsis Patient was tachycardic, tachypneic and WBC was elevated at 19.2 (meets SIRS criteria), patient has a source of infection (left foot diabetic foot ulcer with osteomyelitis and associated cellulitis), however, patient does not appear septic clinically, she was in no acute distress, lactic acid was normal and patient does not require IV hydration to maintain normal BP.  Hypokalemia K+ 3.2; this was replenished  Hypoalbuminemia possibly secondary to moderate protein calorie malnutrition Protein supplement will be provided  Hyperglycemia secondary to type 2DM Continue insulin sliding scale and hypoglycemic protocol Glipizide and Trulicity will be held at this time  Elevated ALP ALP 155, patient denies any abdominal pain Continue to monitor liver enzymes  Atrial fibrillation Continue Lopressor Eliquis will be held at this time in anticipation for possible surgical procedure in the morning  History of CAD S/P stents in the past Continue statin and resume Eliquis if no surgical intervention required  Hypertension  Continue lisinopril and Lopressor  COPD Continue home Ventolin and Proventil as needed Morbid obesity (BMI 51.37) Patient was concerned diet and lifestyle modification  GERD Continue Protonix  Hyperlipidemia Continue statin  Obstructive sleep apnea on CPAP Continue CPAP at night  DVT prophylaxis: SCDs  Code Status: Full code  Family Communication: None at bedside  Disposition Plan:  Patient is from:                        home Anticipated DC to:                    SNF or family members home Anticipated DC date:               2-3 days Anticipated DC barriers:          Patient is unstable to be discharged at this time due  to worsening left diabetic foot ulcer with osteomyelitis and associated cellulitis requiring surgical consult with recommendation and IV antibiotic treatment   Consults called: General surgery  Admission status: Full code    Bernadette Hoit MD [facility]  01/22/2020, 10:35 PM

## 2020-01-22 NOTE — Therapy (Signed)
[facility] [address] [place], [place], [address] Phone: [phone]   Fax:  [phone]  Wound Care Therapy  Patient Details  Name: Nicole [name] MRN: [id number] Date of Birth: 1965-03-09 Referring Provider (PT): Erick Blinks   Encounter Date: 01/22/2020   PT End of Session - 01/22/20 1338    Visit Number 2    Number of Visits 18    Date for PT Re-Evaluation 03/04/20    Authorization Type [employer]    Authorization - Visit Number 2    Authorization - Number of Visits 18    Progress Note Due on Visit 10    PT Start Time 1315    PT Stop Time 1330    PT Time Calculation (min) 15 min    Activity Tolerance Patient tolerated treatment well    Behavior During Therapy [facility] for tasks assessed/performed           Past Medical History:  Diagnosis Date  . CAP (community acquired pneumonia)    Streptococcus 01/2011  . COPD (chronic obstructive pulmonary disease) (HCC)   . Coronary atherosclerosis of native coronary artery    Diagnosed [place] 2006 - DES RCA, reports followup cath 2009 at [facility]   . Dyslipidemia   . Essential hypertension, benign   . Morbid obesity (HCC)   . Pancreatitis   . Type 2 diabetes mellitus (HCC)     Past Surgical History:  Procedure Laterality Date  . ABDOMINAL HERNIA REPAIR    . ABDOMINAL HYSTERECTOMY    . CORONARY ANGIOPLASTY WITH STENT PLACEMENT  2006  . KNEE SURGERY    . NOSE SURGERY    . Pilonidal cystectomy    . TONSILLECTOMY      There were no vitals filed for this visit.      [facility] PT Assessment - 01/22/20 0001      Assessment   Medical Diagnosis Diabetc foot ulcer LT    Referring Provider (PT) Durward Mallard Memon    Onset Date/Surgical Date 11/07/19                   Wound Therapy - 01/22/20 0001    Subjective Patient states that she fell because of Darco shoe so she has been wearing her slippers instead. She changed her bandage about 2 days ago. She has been looking  for a podiatrist but has not found one. Her whole left leg hurts.      Patient and Family Stated Goals wound to heal     Date of Onset 11/07/19    Prior Treatments hospitalized 10/13-10/18.    Pain Location Leg    Pain Orientation Left    Pain Descriptors / Indicators Aching    Pain Onset With Activity    Patients Stated Pain Goal 0    Multiple Pain Sites No    Evaluation and Treatment Procedures Explained to Patient/Family Yes    Evaluation and Treatment Procedures agreed to    Wound Properties Date First Assessed: 01/08/20 Time First Assessed: 1321 Wound Type: Diabetic ulcer Location: Foot Location Orientation: Left Wound Description (Comments): plantar aspect of 1st MCP head.  Present on Admission: Yes   Dressing Type Adhesive bandage   vinyl adhesive bandage with gauze   Dressing Changed Changed    Dressing Status Old drainage;New drainage    Dressing Change Frequency PRN    Site / Wound Assessment Painful;Yellow   yellow, white, green drainage, macerated    Peri-wound Assessment Edema;Erythema (blanchable);Maceration;Purple  Drainage Amount Copious    Drainage Description Purulent;Green;Odor    Treatment Other (Comment)   wrapped with kerlix   Wound Therapy - Clinical Statement Patient arrives wearing slippers and not Darco shoe. When asked why, she stated that she fell using it so she stopped. Upon removal of patient's slipper, her sock was socked in copious amounts of white, yellow, and green drainage. Removed patients sock to find that she had wrapped her foot with a vinyl adhesive bandage with gauze beneath that was not fully covering plantar wound. Removed bandage and her distal plantar and dorsal skin was macerated with skin peeling with removal of dressing. Patient with increased LLE edema, erythema, and pain today. Patient showing signs of infection today and is educated on going to emergency department for further evaluation and treatment. Also educated patient on importance of  proper footwear, dressings, finding podiatrist, controlling blood sugar, and taking care of herself for her overall health. Wrapped patient with kerlix and instructed her to go for further evaluation and she verbalized understanding.     Wound Therapy - Functional Problem List Pt should not be weight bearing on the wound area she was given a Darco boot this treatment.    Factors Delaying/Impairing Wound Healing Altered sensation;Diabetes Mellitus;Infection - systemic/local;Immobility;Multiple medical problems;Polypharmacy;Tobacco use;Vascular compromise    Hydrotherapy Plan Debridement;Dressing change;Patient/family education    Wound Therapy - Frequency 3X / week   x 2 weeks then 2x a week for 6 weeks   Wound Therapy - Current Recommendations PT;Other (comment)   PT requested pt obtain a podiatrist to follow wound    Wound Plan PT to be seen in this clinic for 3x a wee x 2 weeks followed by 2x a week for 6 weeks for debridement, proper dressing and compression bandaging inclucing toes.    Dressing  kerlix                   PT Education - 01/22/20 1335    Education Details Patient educated on importance finding podiatrist, using proper footwear, controling blood sugar, importance proper bandaging, going to emergency department for further assessment and treatment    Person(s) Educated Patient    Methods Explanation;Demonstration    Comprehension Verbalized understanding;Returned demonstration            PT Short Term Goals - 01/08/20 1439      PT SHORT TERM GOAL #1   Title PT wound to be 100% granulated    Time 5    Period Weeks    Status New    Target Date 02/05/20      PT SHORT TERM GOAL #2   Title Pt to understand the benefit to keeping edema in B LE controlled and to be using compression garment on Rt LE    Time 4    Period Weeks    Status New      PT SHORT TERM GOAL #3   Title PT wound to have decreased in size by 50%    Time 4    Period Weeks    Status New               PT Long Term Goals - 01/08/20 1441      PT LONG TERM GOAL #1   Title Callous surrounding wound to have been removed    Time 8    Period Weeks    Status New    Target Date 03/04/20      PT LONG TERM GOAL #2  Title Both wounds on plantar aspect of Lt foot to be healed    Time 8    Period Weeks    Status New      PT LONG TERM GOAL #3   Title PT to be wearing compression garment on both LE    Time 8    Period Weeks    Status New                 Plan - 01/22/20 1339    Clinical Impression Statement see above    Personal Factors and Comorbidities Comorbidity 3+;Fitness;Past/Current Experience;Time since onset of injury/illness/exacerbation    Comorbidities DM, CHF, HTN, AFIB, COPD    Examination-Activity Limitations Dressing;Locomotion Level    Examination-Participation Restrictions Community Activity    Stability/Clinical Decision Making Evolving/Moderate complexity    Rehab Potential Fair    PT Frequency 3x / week   for 2 weeks then 2xs for 6   PT Duration 8 weeks    PT Treatment/Interventions ADLs/Self Care Home Management;Manual techniques;Other (comment);Patient/family education   debridement   PT Next Visit Plan begin profore compression dressing, see if she is 1)using Darco boot; 2) looking for a podiatrist, 3) controlling her sugars; 4 speak to pt about compression garment for Rt LE and the need to use on both once wound has healed.           Patient will benefit from skilled therapeutic intervention in order to improve the following deficits and impairments:  Decreased skin integrity, Difficulty walking, Pain, Obesity, Increased edema  Visit Diagnosis: Diabetic ulcer of left midfoot associated with type 2 diabetes mellitus, with muscle involvement without evidence of necrosis (HCC)  Pain in left foot     Problem List Patient Active Problem List   Diagnosis Date Noted  . Diabetic ulcer of left foot (HCC) 12/05/2019  . Cough 05/26/2012  .  TIA (transient ischemic attack) 04/10/2012  . Acute diastolic congestive heart failure (HCC) 11/12/2011  . COPD (chronic obstructive pulmonary disease) (HCC) 11/09/2011  . Dyspnea 06/10/2011  . CAP (community acquired pneumonia) 02/19/2011  . Coronary atherosclerosis of native coronary artery 02/19/2011  . Dyslipidemia   . Morbid obesity (HCC)   . Diabetes mellitus type 2 in obese (HCC)   . HTN (hypertension), benign     2:00 PM, 01/22/20 Wyman Songster PT, DPT Physical Therapist at [facility]   [facility] [address] [place], [place], [address] Phone: [phone]   Fax:  [phone]  Name: Nicole [name] MRN: [id number] Date of Birth: Jul 05, 1965

## 2020-01-22 NOTE — ED Provider Notes (Addendum)
[facility] Provider Note   CSN: [id number] Arrival date & time: 01/22/20  1415     History Chief Complaint  Patient presents with  . Foot Pain    Nicole [name] is a 54 y.o. female.  HPI 54 year old female with history of COPD, edema, morbid obesity, DM type II, left diabetic foot ulcer presents to the ER with complaints of worsening pain to the left foot and worsening infection to the left foot.  Patient states that she has been following with wound care, last visit was on November 15.  She was seen at the wound care office today and when they unwrapped her foot, they noted that her infection looked significantly worse.  She was told to come to the ER.  She states that she finished her course of antibiotics back in October and that over while her foot was doing well.  She denies any fevers or chills.  No numbness or tingling.  Blood sugar here of 336.    Past Medical History:  Diagnosis Date  . CAP (community acquired pneumonia)    Streptococcus 01/2011  . COPD (chronic obstructive pulmonary disease) ([place])   . Coronary atherosclerosis of native coronary artery    Diagnosed [place] 2006 - DES RCA, reports followup cath 2009 at [facility]   . Dyslipidemia   . Essential hypertension, benign   . Morbid obesity ([place])   . Pancreatitis   . Type 2 diabetes mellitus [facility])     Patient Active Problem List   Diagnosis Date Noted  . Diabetic ulcer of left foot ([place]) 12/05/2019  . Cough 05/26/2012  . TIA (transient ischemic attack) 04/10/2012  . Acute diastolic congestive heart failure ([place]) 11/12/2011  . COPD (chronic obstructive pulmonary disease) ([place]) 11/09/2011  . Dyspnea 06/10/2011  . CAP (community acquired pneumonia) 02/19/2011  . Coronary atherosclerosis of native coronary artery 02/19/2011  . Dyslipidemia   . Morbid obesity ([place])   . Diabetes mellitus type 2 in obese ([place])   . HTN (hypertension), benign     Past Surgical History:  Procedure  Laterality Date  . ABDOMINAL HERNIA REPAIR    . ABDOMINAL HYSTERECTOMY    . CORONARY ANGIOPLASTY WITH STENT PLACEMENT  2006  . KNEE SURGERY    . NOSE SURGERY    . Pilonidal cystectomy    . TONSILLECTOMY       OB History   No obstetric history on file.     Family History  Problem Relation Age of Onset  . Diabetes Mother   . Heart failure Mother   . Hypertension Mother   . Kidney failure Mother   . Ovarian cancer Mother   . Asthma Son     Social History   Tobacco Use  . Smoking status: Current Every Day Smoker    Packs/day: 0.50    Years: 20.00    Pack years: 10.00    Types: Cigarettes  . Smokeless tobacco: Never Used  Vaping Use  . Vaping Use: Never used  Substance Use Topics  . Alcohol use: No  . Drug use: No    Home Medications Prior to Admission medications   Medication Sig Start Date End Date Taking? Authorizing Provider  albuterol (PROVENTIL) (2.5 MG/3ML) 0.083% nebulizer solution Take 3 mLs (2.5 mg total) by nebulization every 6 (six) hours as needed for wheezing. 05/25/12   Tanda Rockers, MD  albuterol (VENTOLIN HFA) 108 (90 BASE) MCG/ACT inhaler Inhale 2 puffs into the lungs as needed for wheezing. Shortness  of breath    [provider]  amoxicillin-clavulanate (AUGMENTIN) 875-125 MG tablet Take 1 tablet by mouth every 12 (twelve) hours. 12/10/19   Kathie Dike, MD  apixaban (ELIQUIS) 5 MG TABS tablet Take 5 mg by mouth 2 (two) times daily.    [provider]  buPROPion (WELLBUTRIN SR) 100 MG 12 hr tablet Take 1 tablet (100 mg total) by mouth 2 (two) times daily. 12/25/19   Ailene Ards, NP  citalopram (CELEXA) 10 MG tablet Take 10 mg by mouth daily.    [provider]  clopidogrel (PLAVIX) 75 MG tablet Take 1 tablet (75 mg total) by mouth daily. 12/25/19   Ailene Ards, NP  doxycycline (VIBRA-TABS) 100 MG tablet Take 1 tablet (100 mg total) by mouth every 12 (twelve) hours. 12/10/19   Kathie Dike, MD  DULoxetine (CYMBALTA)  60 MG capsule Take 1 capsule (60 mg total) by mouth 2 (two) times daily. 12/25/19   Ailene Ards, NP  EASY [facility] INSULIN SYRINGE 31G X 5/16" 1 ML MISC  09/21/19   [provider]  EPINEPHrine 0.3 mg/0.3 mL IJ SOAJ injection Inject 0.3 mg into the muscle as needed for anaphylaxis.    [provider]  furosemide (LASIX) 80 MG tablet Take 1 tablet (80 mg total) by mouth daily. 12/25/19   Ailene Ards, NP  glipiZIDE (GLUCOTROL) 10 MG tablet Take 1 tablet (10 mg total) by mouth in the morning and at bedtime. 12/25/19   Ailene Ards, NP  insulin aspart (NOVOLOG) 100 UNIT/ML injection Inject 60 Units into the skin 3 (three) times daily with meals.     [provider]  insulin detemir (LEVEMIR) 100 UNIT/ML injection Inject 100 Units into the skin 2 (two) times daily.     [provider]  insulin regular (NOVOLIN R) 100 units/mL injection Inject 100 Units into the skin See admin instructions. 0-20 per sliding scale    [provider]  lisinopril (ZESTRIL) 2.5 MG tablet Take 2.5 mg by mouth daily.    [provider]  loperamide (IMODIUM) 2 MG capsule Take 1 capsule (2 mg total) by mouth every 6 (six) hours as needed for diarrhea or loose stools. 12/10/19   Kathie Dike, MD  metoprolol tartrate (LOPRESSOR) 50 MG tablet Take 1 tablet (50 mg total) by mouth 2 (two) times daily. 12/25/19   Ailene Ards, NP  Multiple Vitamins-Calcium (ONE-A-DAY WOMENS FORMULA) TABS Take 1 tablet by mouth daily.    [provider]  mupirocin ointment (BACTROBAN) 2 % Apply topically daily. 12/10/19   Kathie Dike, MD  pantoprazole (PROTONIX) 40 MG tablet Take 1 tablet (40 mg total) by mouth daily. Take 30-60 min before first meal of the day 12/25/19   Ailene Ards, NP  simvastatin (ZOCOR) 80 MG tablet Take 80 mg by mouth daily.    [provider]  SUPREP BOWEL PREP KIT 17.5-3.13-1.6 GM/177ML SOLN SMARTSIG:354 Milliliter(s) By Mouth As Directed Patient not  taking: Reported on 12/25/2019 08/31/19   [provider]  TRULICITY 1.5 VO/1.6WV SOPN SMARTSIG:0.5 Milliliter(s) SUB-Q Once a Week 11/05/19   [provider]    Allergies    Aspirin, Bee pollen, Bee venom, Food, Pineapple, E-mycin [erythromycin base], and Other  Review of Systems   Review of Systems  Constitutional: Negative for chills and fever.  HENT: Negative for ear pain and sore throat.   Eyes: Negative for pain and visual disturbance.  Respiratory: Negative for cough and shortness of  breath.   Cardiovascular: Negative for chest pain and palpitations.  Gastrointestinal: Negative for abdominal pain and vomiting.  Genitourinary: Negative for dysuria and hematuria.  Musculoskeletal: Positive for arthralgias and joint swelling. Negative for back pain.  Skin: Positive for rash and wound. Negative for color change.  Neurological: Negative for seizures and syncope.  All other systems reviewed and are negative.   Physical Exam Updated Vital Signs BP 135/67 (BP Location: Right Arm)   Pulse (!) 110   Temp 98.2 F (36.8 C) (Oral)   Resp (!) 26   Ht 5\' 3"  (1.6 m)   Wt 131.5 kg   SpO2 97%   BMI 51.37 kg/m   Physical Exam Vitals and nursing note reviewed.  Constitutional:      General: She is not in acute distress.    Appearance: She is well-developed. She is obese. She is not ill-appearing or diaphoretic.  HENT:     Head: Normocephalic and atraumatic.     Mouth/Throat:     Mouth: Mucous membranes are moist.  Eyes:     Conjunctiva/sclera: Conjunctivae normal.  Cardiovascular:     Rate and Rhythm: Normal rate and regular rhythm.     Heart sounds: No murmur heard.   Pulmonary:     Effort: Pulmonary effort is normal. No respiratory distress.     Breath sounds: Normal breath sounds.     Comments: On normal supplemental 3 L of O2 Abdominal:     Palpations: Abdomen is soft.     Tenderness: There is no abdominal tenderness.  Musculoskeletal:        General:  Swelling present. No tenderness or signs of injury. Normal range of motion.     Cervical back: Neck supple.     Right lower leg: Edema present.     Left lower leg: Edema present.     Comments: Left foot with evidence of extensive disease, large amounts of surrounding eschar which has increased from prior photos.  She has surrounding erythema.  She has a deep ulcer ventral aspect of her left foot.  She does have evidence of a left fifth toe MTP amputation which is well-healed over.  She has some erythema to the ankle and shin with 1+ nonpitting edema  Skin:    General: Skin is warm and dry.     Capillary Refill: Capillary refill takes less than 2 seconds.     Findings: Erythema and lesion present.  Neurological:     General: No focal deficit present.     Mental Status: She is alert and oriented to person, place, and time.  Psychiatric:        Mood and Affect: Mood normal.        Behavior: Behavior normal.         ED Results / Procedures / Treatments   Labs (all labs ordered are listed, but only abnormal results are displayed) Labs Reviewed  GLUCOSE, CAPILLARY - Abnormal; Notable for the following components:      Result Value   Glucose-Capillary 332 (*)    All other components within normal limits  CBC WITH DIFFERENTIAL/PLATELET - Abnormal; Notable for the following components:   WBC 19.2 (*)    RBC 3.74 (*)    Hemoglobin 11.0 (*)    HCT 35.4 (*)    Neutro Abs 14.1 (*)    Monocytes Absolute 1.5 (*)    Abs Immature Granulocytes 0.34 (*)    All other components within normal limits  COMPREHENSIVE METABOLIC PANEL - Abnormal;  Notable for the following components:   Potassium 3.2 (*)    Chloride 94 (*)    Glucose, Bld 371 (*)    Calcium 8.5 (*)    Albumin 2.5 (*)    Alkaline Phosphatase 155 (*)    All other components within normal limits  CULTURE, BLOOD (ROUTINE X 2)  CULTURE, BLOOD (ROUTINE X 2)  RESP PANEL BY RT-PCR (FLU A&B, COVID) ARPGX2  URINALYSIS, ROUTINE W REFLEX  MICROSCOPIC  LACTIC ACID, PLASMA  LACTIC ACID, PLASMA    EKG None  Radiology DG Foot Complete Left  Result Date: 01/22/2020 CLINICAL DATA:  Left great toe and first metatarsal infection, diabetes, swelling EXAM: LEFT FOOT - COMPLETE 3+ VIEW COMPARISON:  12/05/2019 FINDINGS: Frontal, oblique, lateral views of the left foot are obtained. There is extensive soft tissue swelling throughout the left foot. Subcutaneous gas is seen throughout the left forefoot, greatest between the first and second metatarsal heads. There is cortical irregularity along the lateral margin of the distal tuft of the first distal phalanx concerning for osteomyelitis. No other acute or destructive bony lesions. Prior fifth digit AP taken at the metatarsophalangeal joint. IMPRESSION: 1. Extensive soft tissue swelling and subcutaneous emphysema throughout the left foot, compatible with infection. 2. Cortical irregularity lateral margin distal tuft first distal phalanx, consistent with osteomyelitis. Electronically Signed   By: Randa Ngo M.D.   On: 01/22/2020 15:51    Procedures Procedures (including critical care time)  Medications Ordered in ED Medications  piperacillin-tazobactam (ZOSYN) IVPB 3.375 g (0 g Intravenous Stopped 01/22/20 1844)    ED Course  I have reviewed the triage vital signs and the nursing notes.  Pertinent labs & imaging results that were available during my care of the patient were reviewed by me and considered in my medical decision making (see chart for details).    MDM Rules/Calculators/A&P                          54 year old female with worsening left foot infection On presentation, her left foot does appear to have worsening discharge, erythema, patient is complaining of pain.  She is slightly tachycardic, however afebrile.  She is on her normal 3 L of O2 via nasal cannula.  Plain films show signs of soft tissue swelling and subcutaneous emphysema throughout the left foot, as well  as cortical irregularity of the distal tuft of the first distal phalanx consistent with osteomyelitis.  Per chart review, patient did not have any osteomyelitis on her MRI which was done in October.  Labs reviewed and interpreted by me -CBC with a white count of 19.2, significantly increased from 4 weeks ago which was 12.  Hemoglobin of 11 which appears to be stable.   -CMP with hypokalemia 3.2, normal kidney and liver function test. -Glucose here 332 -Lactic pending -Cultures pending -COVID Pending  MDM: Patient was started on IV Zosyn given new osteomyelitis in the 1st digit. No signs of sepsis. Oral potassium repleted.  Given new white count in the setting of worsening left foot infection, patient will need admission.  Consulted hospitalist team who will admit the patient for further evaluation and treatment   Final Clinical Impression(s) / ED Diagnoses Final diagnoses:  Acute osteomyelitis of toe, left [facility])    Rx / DC Orders ED Discharge Orders    None          Lyndel Safe 01/22/20 1854    Maudie Flakes, MD 01/22/20  2354  

## 2020-01-22 NOTE — Progress Notes (Signed)
Pharmacy Antibiotic Note  Nicole [name] is a 54 y.o. female admitted on 01/22/2020 with L foot cellulitis/osteomyelitis.  Pharmacy has been consulted for Vancomycin dosing. Pt also on Zosyn.  Plan: Vancomycin 1000mg  IV now (to make total loading dose = 2000mg ) then 1250 mg IV Q 12 hrs.  Will f/u renal function, micro data, and pt's clinical condition Vanc levels prn   Height: 5\' 3"  (160 cm) Weight: 131.5 kg (290 lb) IBW/kg (Calculated) : 52.4  Temp (24hrs), Avg:98.5 F (36.9 C), Min:98.2 F (36.8 C), Max:98.7 F (37.1 C)  Recent Labs  Lab 01/22/20 1701 01/22/20 2015  WBC 19.2*  --   CREATININE 0.88  --   LATICACIDVEN  --  1.6    Estimated Creatinine Clearance: 95.8 mL/min (by C-G formula based on SCr of 0.88 mg/dL).    Allergies  Allergen Reactions  . Aspirin Anaphylaxis and Other (See Comments)    Throat closing   . Bee Pollen Anaphylaxis  . Bee Venom Anaphylaxis  . Food Anaphylaxis    PINEAPPLE  . Pineapple Anaphylaxis  . E-Mycin [Erythromycin Base] Nausea And Vomiting  . Other Rash    Antimicrobials this admission: 11/30 Vanc >>  11/30 Zosyn >>   Microbiology results: 11/30 BCx:   Thank you for allowing pharmacy to be a part of this patient's care.  12/30, PharmD, BCPS Please see amion for complete clinical pharmacist phone list 01/22/2020 10:39 PM

## 2020-01-23 ENCOUNTER — Ambulatory Visit (HOSPITAL_COMMUNITY): Payer: Medicare (Managed Care) | Admitting: Physical Therapy

## 2020-01-23 ENCOUNTER — Encounter (INDEPENDENT_AMBULATORY_CARE_PROVIDER_SITE_OTHER): Payer: Self-pay | Admitting: *Deleted

## 2020-01-23 DIAGNOSIS — E66813 Obesity, class 3: Secondary | ICD-10-CM

## 2020-01-23 DIAGNOSIS — Z6841 Body Mass Index (BMI) 40.0 and over, adult: Secondary | ICD-10-CM

## 2020-01-23 DIAGNOSIS — I2510 Atherosclerotic heart disease of native coronary artery without angina pectoris: Secondary | ICD-10-CM

## 2020-01-23 DIAGNOSIS — A419 Sepsis, unspecified organism: Secondary | ICD-10-CM | POA: Diagnosis present

## 2020-01-23 DIAGNOSIS — Z720 Tobacco use: Secondary | ICD-10-CM | POA: Diagnosis present

## 2020-01-23 LAB — COMPREHENSIVE METABOLIC PANEL
ALT: 27 U/L (ref 0–44)
AST: 31 U/L (ref 15–41)
Albumin: 2.5 g/dL — ABNORMAL LOW (ref 3.5–5.0)
Alkaline Phosphatase: 140 U/L — ABNORMAL HIGH (ref 38–126)
Anion gap: 10 (ref 5–15)
BUN: 15 mg/dL (ref 6–20)
CO2: 32 mmol/L (ref 22–32)
Calcium: 8.6 mg/dL — ABNORMAL LOW (ref 8.9–10.3)
Chloride: 97 mmol/L — ABNORMAL LOW (ref 98–111)
Creatinine, Ser: 0.74 mg/dL (ref 0.44–1.00)
GFR, Estimated: >60 mL/min (ref >60)
Glucose, Bld: 131 mg/dL — ABNORMAL HIGH (ref 70–99)
Potassium: 3.6 mmol/L (ref 3.5–5.1)
Sodium: 139 mmol/L (ref 135–145)
Total Bilirubin: 0.7 mg/dL (ref 0.3–1.2)
Total Protein: 6.8 g/dL (ref 6.5–8.1)

## 2020-01-23 LAB — CBC
HCT: 34.7 % — ABNORMAL LOW (ref 36.0–46.0)
Hemoglobin: 11 g/dL — ABNORMAL LOW (ref 12.0–15.0)
MCH: 30.1 pg (ref 26.0–34.0)
MCHC: 31.7 g/dL (ref 30.0–36.0)
MCV: 94.8 fL (ref 80.0–100.0)
Platelets: 328 10*3/uL (ref 150–400)
RBC: 3.66 MIL/uL — ABNORMAL LOW (ref 3.87–5.11)
RDW: 14.4 % (ref 11.5–15.5)
WBC: 17.1 10*3/uL — ABNORMAL HIGH (ref 4.0–10.5)
nRBC: 0 % (ref 0.0–0.2)

## 2020-01-23 LAB — PROTIME-INR
INR: 1.4 — ABNORMAL HIGH (ref 0.8–1.2)
Prothrombin Time: 16.4 seconds — ABNORMAL HIGH (ref 11.4–15.2)

## 2020-01-23 LAB — MAGNESIUM: Magnesium: 1.6 mg/dL — ABNORMAL LOW (ref 1.7–2.4)

## 2020-01-23 LAB — GLUCOSE, CAPILLARY
Glucose-Capillary: 281 mg/dL — ABNORMAL HIGH (ref 70–99)
Glucose-Capillary: 302 mg/dL — ABNORMAL HIGH (ref 70–99)
Glucose-Capillary: 218 mg/dL — ABNORMAL HIGH (ref 70–99)

## 2020-01-23 LAB — APTT: aPTT: 39 seconds — ABNORMAL HIGH (ref 24–36)

## 2020-01-23 LAB — PHOSPHORUS: Phosphorus: 3.7 mg/dL (ref 2.5–4.6)

## 2020-01-23 LAB — LACTIC ACID, PLASMA: Lactic Acid, Venous: 1.6 mmol/L (ref 0.5–1.9)

## 2020-01-23 LAB — CBG MONITORING, ED: Glucose-Capillary: 120 mg/dL — ABNORMAL HIGH (ref 70–99)

## 2020-01-23 MED ORDER — MOMETASONE FURO-FORMOTEROL FUM 200-5 MCG/ACT IN AERO
2.0000 | INHALATION_SPRAY | Freq: Two times a day (BID) | RESPIRATORY_TRACT | Status: DC
  Administered 2020-01-23 – 2020-02-06 (×24): 2 via RESPIRATORY_TRACT
  Filled 2020-01-23 (×2): qty 8.8

## 2020-01-23 MED ORDER — MAGNESIUM SULFATE 4 GM/100ML IV SOLN
4.0000 g | Freq: Once | INTRAVENOUS | Status: AC
  Administered 2020-01-23: 4 g via INTRAVENOUS
  Filled 2020-01-23: qty 100

## 2020-01-23 MED ORDER — HEPARIN SODIUM (PORCINE) 5000 UNIT/ML IJ SOLN
5000.0000 [IU] | Freq: Three times a day (TID) | INTRAMUSCULAR | Status: DC
  Administered 2020-01-23 – 2020-01-24 (×4): 5000 [IU] via SUBCUTANEOUS
  Filled 2020-01-23 (×4): qty 1

## 2020-01-23 MED ORDER — NICOTINE 21 MG/24HR TD PT24
21.0000 mg | MEDICATED_PATCH | Freq: Every day | TRANSDERMAL | Status: DC
  Administered 2020-01-23 – 2020-01-24 (×2): 21 mg via TRANSDERMAL
  Filled 2020-01-23 (×7): qty 1

## 2020-01-23 MED ORDER — DULOXETINE HCL 30 MG PO CPEP
30.0000 mg | ORAL_CAPSULE | Freq: Two times a day (BID) | ORAL | Status: DC
  Administered 2020-01-23 – 2020-02-06 (×28): 30 mg via ORAL
  Filled 2020-01-23 (×28): qty 1

## 2020-01-23 MED ORDER — GLUCERNA SHAKE PO LIQD
237.0000 mL | Freq: Two times a day (BID) | ORAL | Status: DC
  Administered 2020-01-23 – 2020-02-06 (×23): 237 mL via ORAL

## 2020-01-23 MED ORDER — POTASSIUM CHLORIDE CRYS ER 20 MEQ PO TBCR
40.0000 meq | EXTENDED_RELEASE_TABLET | Freq: Once | ORAL | Status: AC
  Administered 2020-01-23: 40 meq via ORAL
  Filled 2020-01-23: qty 2

## 2020-01-23 NOTE — Progress Notes (Addendum)
Patient Demographics:    Nicole [name], is a 54 y.o. female, DOB - January 30, 1966, BTY:[id number]  Admit date - 01/22/2020   Admitting Physician Bernadette Hoit, DO  Outpatient Primary MD for the patient is Doree Albee, MD  LOS - 1   Chief Complaint  Patient presents with  . Foot Pain        Subjective:    [name] today has no fevers, no emesis,  No chest pain,   --Patient seen in conjunction with general surgeon Dr. Arnoldo Morale at bedside  Assessment  & Plan :    Principal Problem:   Sepsis due to Lt foot osteo Active Problems:   Diabetic ulcer of left foot ([place])   Osteomyelitis of left foot ([place])   Coronary atherosclerosis of native coronary artery/Stent in 2006   COPD (chronic obstructive pulmonary disease) ([place])   Cellulitis of left foot   Morbid obesity with body mass index (BMI) of 50.0 to 59.9 in adult [facility])   Diabetes mellitus type 2 in obese ([place])   HTN (hypertension), benign   Leukocytosis   Hypokalemia   Hypoalbuminemia   Hyperglycemia due to diabetes mellitus ([place])   Tobacco abuse--2 P/day Smoker   Brief Summary:-  54 y.o. female with medical history significant for morbid obesity, ongoing heavy tobacco abuse, COPD, DM2, HTN and HLD, tCAD s/p PCI with stenting in 7741, chronic diastolic CHF, A. fib on Eliquis, OSA on CPAP  readmitted on 01/22/2020 with worsening left foot cellulitis and infected diabetic ulcer with osteomyelitis -- Patient was admitted from 10/13-10/18 due to left foot diabetic ulcer with associated cellulitis during which she was treated with IV antibiotics which was transitioned to oral antibiotics on discharge, patient has been following up with outpatient wound clinic  - However left foot infection has worsened,  -Patient seen in conjunction with general surgeon Dr. Arnoldo Morale at bedside on 01/23/20, discussed with Dr. Sharol Given at [facility] who will be glad to  see patient once patient gets to [facility] -Transfer to [facility] for now evaluation by Dr. Sharol Given, patient will most likely need orthopedic/operative intervention   A/p   1)Sepsis secondary to left foot cellulitis/infected diabetic ulcer and osteomyelitis--POA -patient met sepsis criteria on admission with leukocytosis, tachycardia and tachypnea along with infected left foot --continue IV Vanco and Zosyn pending further evaluation by Dr. Sharol Given -Transfer to [facility] for now evaluation by Dr. Sharol Given, patient will most likely need orthopedic/operative intervention -Please call Dr. Meridee Score when patient arrives at [place]  2)DM2--recent A1c 9.6 reflecting uncontrolled DM -Hold Trulicity, Levemir and glipizide in the perioperative patient Use Novolog/Humalog Sliding scale insulin with Accu-Cheks/Fingersticks as ordered   3) atrial fibrillation--hold Eliquis to allow for surgical intervention, continue metoprolol for rate control  4) morbid obesity and OSA---CPAP nightly advised, -After resolution of left foot infection pt will be advised to follow-up low calorie diet, portion control and increase physical activity discussed with patient -Body mass index is 51.37 kg/m.  5)CAD--status post prior angioplasty and stenting in 2006, no ACS type symptoms at this time, continue simvastatin , continue metoprolol 50 mg twice daily -Unable to give aspirin while off Eliquis due to severe aspirin allergy -Use of Plavix may delay operative intervention  6) tobacco abuse and COPD----patient smokes up to 2 packs a day, give nicotine patch, use bronchodilators as ordered  7)HFpEF--patient with history of chronic diastolic dysfunction CHF no acute exacerbation at this time--no indication for diuretics at this time-Okay to continue metoprolol and lisinopril  7) depression anxiety--- stable, continue Cymbalta  8)GERD--- Protonix as ordered  9) hypokalemia/hypomagnesemia ---  replace and recheck  10) acute hypoxic respiratory failure suspect due to underlying COPD, continue supplemental oxygen at 2 to 3 L/min continuously  Disposition/Need for in-Hospital Stay- patient unable to be discharged at this time due to --sepsis secondary to left foot infected diabetic ulcer/cellulitis and osteomyelitis requiring IV antibiotics and most likely operative intervention by Dr. Sharol Given  Status is: Inpatient  Remains inpatient appropriate because:sepsis secondary to left foot infected diabetic ulcer/cellulitis and osteomyelitis requiring IV antibiotics and most likely operative intervention by Dr. Sharol Given  Disposition: The patient is from: Home              Anticipated d/c is to: Home              Anticipated d/c date is: 2 days              Patient currently is not medically stable to d/c. Barriers: Not Clinically Stable- sepsis secondary to left foot infected diabetic ulcer/cellulitis and osteomyelitis requiring IV antibiotics and most likely operative intervention by Dr. Sharol Given   Code Status : full   Family Communication:     (patient is alert, awake and coherent)  -No family at bedside  Consults  : [place] conversation with general surgeon Dr. Renea Ee consult requested from orthopedic surgeon Dr. Sharol Given  DVT Prophylaxis  :   - Heparin - SCDs    Lab Results  Component Value Date   PLT 328 01/23/2020    Inpatient Medications  Scheduled Meds: . atorvastatin  40 mg Oral Daily  . feeding supplement (GLUCERNA SHAKE)  237 mL Oral TID BM  . insulin aspart  0-15 Units Subcutaneous Q4H  . lisinopril  2.5 mg Oral Daily  . metoprolol tartrate  50 mg Oral BID  . pantoprazole  40 mg Oral Daily   Continuous Infusions: . piperacillin-tazobactam (ZOSYN)  IV Stopped (01/23/20 0514)  . vancomycin     PRN Meds:.albuterol, albuterol, oxyCODONE    Anti-infectives (From admission, onward)   Start     Dose/Rate Route Frequency Ordered Stop   01/23/20 1100  vancomycin  (VANCOREADY) IVPB 1250 mg/250 mL        1,250 mg 166.7 mL/hr over 90 Minutes Intravenous Every 12 hours 01/22/20 2244     01/23/20 0200  piperacillin-tazobactam (ZOSYN) IVPB 3.375 g        3.375 g 12.5 mL/hr over 240 Minutes Intravenous Every 8 hours 01/22/20 2207     01/23/20 0000  piperacillin-tazobactam (ZOSYN) IVPB 3.375 g  Status:  Discontinued        3.375 g 100 mL/hr over 30 Minutes Intravenous Every 6 hours 01/22/20 2204 01/22/20 2207   01/22/20 2315  vancomycin (VANCOCIN) IVPB 1000 mg/200 mL premix        1,000 mg 200 mL/hr over 60 Minutes Intravenous  Once 01/22/20 2244 01/23/20 0016   01/22/20 2215  vancomycin (VANCOCIN) IVPB 1000 mg/200 mL premix        1,000 mg 200 mL/hr over 60 Minutes Intravenous  Once 01/22/20 2204 01/22/20 2310   01/22/20 1700  piperacillin-tazobactam (ZOSYN) IVPB 3.375 g        3.375 g 100 mL/hr  over 30 Minutes Intravenous  Once 01/22/20 1658 01/22/20 1844        Objective:   Vitals:   01/23/20 0730 01/23/20 0800 01/23/20 0855 01/23/20 1337  BP: (!) 129/52 133/83 110/78 125/61  Pulse:  (!) 113 (!) 108 (!) 114  Resp: (!) 25 (!) 22 20 20   Temp:   97.7 F (36.5 C) 98.1 F (36.7 C)  TempSrc:   Oral   SpO2:  100% 95% 97%  Weight:      Height:        Wt Readings from Last 3 Encounters:  01/22/20 131.5 kg  12/25/19 133.4 kg  12/05/19 131.1 kg     Intake/Output Summary (Last 24 hours) at 01/23/2020 1338 Last data filed at 01/23/2020 0016 Gross per 24 hour  Intake 400 ml  Output --  Net 400 ml    Physical Exam  Gen:- Awake Alert, morbidly obese HEENT:- [place].AT, No sclera icterus Nose- [place] 2L/min Neck-Supple Neck,No JVD,.  Lungs-diminished in bases, no significant wheezing CV- S1, S2 normal, regular  Abd-  +ve B.Sounds, Abd Soft, No tenderness, increased truncal adiposity    Extremity/Skin:- pedal pulses present  Psych-affect is appropriate, oriented x3 Neuro-no new focal deficits, no tremors MSK- Lt Foot with significant erythema,  swelling, tenderness, warmth, and foul-smelling drainage--- there is a hole/defect between the first and second interdigital space--please see photos in epic   Data Review:   Micro Results Recent Results (from the past 240 hour(s))  Blood culture (routine x 2)     Status: None (Preliminary result)   Collection Time: 01/22/20  5:01 PM   Specimen: BLOOD LEFT FOREARM  Result Value Ref Range Status   Specimen Description BLOOD LEFT FOREARM  Final   Special Requests   Final    BOTTLES DRAWN AEROBIC AND ANAEROBIC Blood Culture adequate volume Performed at [facility], [address] [place], [place] [address]    Culture PENDING  Incomplete   Report Status PENDING  Incomplete  Blood culture (routine x 2)     Status: None (Preliminary result)   Collection Time: 01/22/20  5:24 PM   Specimen: BLOOD RIGHT FOREARM  Result Value Ref Range Status   Specimen Description BLOOD RIGHT FOREARM  Final   Special Requests   Final    BOTTLES DRAWN AEROBIC AND ANAEROBIC Blood Culture adequate volume Performed at [facility], [address] [place], [place] [address]    Culture PENDING  Incomplete   Report Status PENDING  Incomplete  Resp Panel by RT-PCR (Flu A&B, Covid) Nasopharyngeal Swab     Status: None   Collection Time: 01/22/20  5:24 PM   Specimen: Nasopharyngeal Swab; Nasopharyngeal(NP) swabs in vial transport medium  Result Value Ref Range Status   SARS Coronavirus 2 by RT PCR NEGATIVE NEGATIVE Final    Comment: (NOTE) SARS-CoV-2 target nucleic acids are NOT DETECTED.  The SARS-CoV-2 RNA is generally detectable in upper respiratory specimens during the acute phase of infection. The lowest concentration of SARS-CoV-2 viral copies this assay can detect is 138 copies/mL. A negative result does not preclude SARS-Cov-2 infection and should not be used as the sole basis for treatment or other patient management decisions. A negative result may occur with  improper specimen collection/handling,  submission of specimen other than nasopharyngeal swab, presence of viral mutation(s) within the areas targeted by this assay, and inadequate number of viral copies(<138 copies/mL). A negative result must be combined with clinical observations, patient history, and epidemiological information. The expected result is  Negative.  Fact Sheet for Patients:  EntrepreneurPulse.com.au  Fact Sheet for Healthcare Providers:  IncredibleEmployment.be  This test is no t yet approved or cleared by the [place] FDA and  has been authorized for detection and/or diagnosis of SARS-CoV-2 by FDA under an Emergency Use Authorization (EUA). This EUA will remain  in effect (meaning this test can be used) for the duration of the COVID-19 declaration under Section 564(b)(1) of the Act, 21 U.S.C.section 360bbb-3(b)(1), unless the authorization is terminated  or revoked sooner.       Influenza A by PCR NEGATIVE NEGATIVE Final   Influenza B by PCR NEGATIVE NEGATIVE Final    Comment: (NOTE) The Xpert Xpress SARS-CoV-2/FLU/RSV plus assay is intended as an aid in the diagnosis of influenza from Nasopharyngeal swab specimens and should not be used as a sole basis for treatment. Nasal washings and aspirates are unacceptable for Xpert Xpress SARS-CoV-2/FLU/RSV testing.  Fact Sheet for Patients: EntrepreneurPulse.com.au  Fact Sheet for Healthcare Providers: IncredibleEmployment.be  This test is not yet approved or cleared by the [place] FDA and has been authorized for detection and/or diagnosis of SARS-CoV-2 by FDA under an Emergency Use Authorization (EUA). This EUA will remain in effect (meaning this test can be used) for the duration of the COVID-19 declaration under Section 564(b)(1) of the Act, 21 U.S.C. section 360bbb-3(b)(1), unless the authorization is terminated or revoked.  Performed at [facility], [address], [place], [place] [address]     Radiology Reports DG Foot Complete Left  Result Date: 01/22/2020 CLINICAL DATA:  Left great toe and first metatarsal infection, diabetes, swelling EXAM: LEFT FOOT - COMPLETE 3+ VIEW COMPARISON:  12/05/2019 FINDINGS: Frontal, oblique, lateral views of the left foot are obtained. There is extensive soft tissue swelling throughout the left foot. Subcutaneous gas is seen throughout the left forefoot, greatest between the first and second metatarsal heads. There is cortical irregularity along the lateral margin of the distal tuft of the first distal phalanx concerning for osteomyelitis. No other acute or destructive bony lesions. Prior fifth digit AP taken at the metatarsophalangeal joint. IMPRESSION: 1. Extensive soft tissue swelling and subcutaneous emphysema throughout the left foot, compatible with infection. 2. Cortical irregularity lateral margin distal tuft first distal phalanx, consistent with osteomyelitis. Electronically Signed   By: Randa Ngo M.D.   On: 01/22/2020 15:51     CBC Recent Labs  Lab 01/22/20 1701 01/23/20 0421  WBC 19.2* 17.1*  HGB 11.0* 11.0*  HCT 35.4* 34.7*  PLT 339 328  MCV 94.7 94.8  MCH 29.4 30.1  MCHC 31.1 31.7  RDW 14.4 14.4  LYMPHSABS 2.8  --   MONOABS 1.5*  --   EOSABS 0.4  --   BASOSABS 0.1  --     Chemistries  Recent Labs  Lab 01/22/20 1701 01/23/20 0421  NA 135 139  K 3.2* 3.6  CL 94* 97*  CO2 29 32  GLUCOSE 371* 131*  BUN 16 15  CREATININE 0.88 0.74  CALCIUM 8.5* 8.6*  MG  --  1.6*  AST 37 31  ALT 30 27  ALKPHOS 155* 140*  BILITOT 0.8 0.7   ------------------------------------------------------------------------------------------------------------------ No results for input(s): CHOL, HDL, LDLCALC, TRIG, CHOLHDL, LDLDIRECT in the last 72 hours.  Lab Results  Component Value Date   HGBA1C 9.6 (H) 12/25/2019    ------------------------------------------------------------------------------------------------------------------ No results for input(s): TSH, T4TOTAL, T3FREE, THYROIDAB in the last 72 hours.  Invalid input(s): FREET3 ------------------------------------------------------------------------------------------------------------------ No results for input(s): VITAMINB12, FOLATE, FERRITIN, TIBC,  IRON, RETICCTPCT in the last 72 hours.  Coagulation profile Recent Labs  Lab 01/23/20 0421  INR 1.4*    No results for input(s): DDIMER in the last 72 hours.  Cardiac Enzymes No results for input(s): CKMB, TROPONINI, MYOGLOBIN in the last 168 hours.  Invalid input(s): CK ------------------------------------------------------------------------------------------------------------------ No results found for: BNP   Roxan Hockey M.D on 01/23/2020 at 1:38 PM  Go to [email or web address] - for contact info  [facility] - Office  [phone]

## 2020-01-23 NOTE — Plan of Care (Signed)

## 2020-01-23 NOTE — Plan of Care (Signed)
  Problem: Education: Goal: Knowledge of General Education information will improve Description: Including pain rating scale, medication(s)/side effects and non-pharmacologic comfort measures 01/23/2020 1323 by Liborio Nixon, RN Outcome: Progressing 01/23/2020 1211 by Liborio Nixon, RN Outcome: Progressing   Problem: Health Behavior/Discharge Planning: Goal: Ability to manage health-related needs will improve 01/23/2020 1323 by Liborio Nixon, RN Outcome: Progressing 01/23/2020 1211 by Liborio Nixon, RN Outcome: Progressing   Problem: Clinical Measurements: Goal: Ability to maintain clinical measurements within normal limits will improve 01/23/2020 1323 by Liborio Nixon, RN Outcome: Progressing 01/23/2020 1211 by Liborio Nixon, RN Outcome: Progressing Goal: Will remain free from infection 01/23/2020 1323 by Liborio Nixon, RN Outcome: Progressing 01/23/2020 1211 by Liborio Nixon, RN Outcome: Progressing Goal: Diagnostic test results will improve 01/23/2020 1323 by Liborio Nixon, RN Outcome: Progressing 01/23/2020 1211 by Liborio Nixon, RN Outcome: Progressing Goal: Respiratory complications will improve 01/23/2020 1323 by Liborio Nixon, RN Outcome: Progressing 01/23/2020 1211 by Liborio Nixon, RN Outcome: Progressing Goal: Cardiovascular complication will be avoided 01/23/2020 1323 by Liborio Nixon, RN Outcome: Progressing 01/23/2020 1211 by Liborio Nixon, RN Outcome: Progressing   Problem: Activity: Goal: Risk for activity intolerance will decrease 01/23/2020 1323 by Liborio Nixon, RN Outcome: Progressing 01/23/2020 1211 by Liborio Nixon, RN Outcome: Progressing   Problem: Nutrition: Goal: Adequate nutrition will be maintained 01/23/2020 1323 by Liborio Nixon, RN Outcome: Progressing 01/23/2020 1211 by Liborio Nixon, RN Outcome: Progressing   Problem: Coping: Goal: Level of anxiety will decrease 01/23/2020  1323 by Liborio Nixon, RN Outcome: Progressing 01/23/2020 1211 by Liborio Nixon, RN Outcome: Progressing   Problem: Elimination: Goal: Will not experience complications related to bowel motility 01/23/2020 1323 by Liborio Nixon, RN Outcome: Progressing 01/23/2020 1211 by Liborio Nixon, RN Outcome: Progressing Goal: Will not experience complications related to urinary retention 01/23/2020 1323 by Liborio Nixon, RN Outcome: Progressing 01/23/2020 1211 by Liborio Nixon, RN Outcome: Progressing   Problem: Pain Managment: Goal: General experience of comfort will improve 01/23/2020 1323 by Liborio Nixon, RN Outcome: Progressing 01/23/2020 1211 by Liborio Nixon, RN Outcome: Progressing   Problem: Safety: Goal: Ability to remain free from injury will improve 01/23/2020 1323 by Liborio Nixon, RN Outcome: Progressing 01/23/2020 1211 by Liborio Nixon, RN Outcome: Progressing   Problem: Skin Integrity: Goal: Risk for impaired skin integrity will decrease 01/23/2020 1323 by Liborio Nixon, RN Outcome: Progressing 01/23/2020 1211 by Liborio Nixon, RN Outcome: Progressing

## 2020-01-24 ENCOUNTER — Other Ambulatory Visit: Payer: Self-pay | Admitting: Physician Assistant

## 2020-01-24 DIAGNOSIS — L97422 Non-pressure chronic ulcer of left heel and midfoot with fat layer exposed: Secondary | ICD-10-CM

## 2020-01-24 LAB — BASIC METABOLIC PANEL
Anion gap: 10 (ref 5–15)
BUN: 16 mg/dL (ref 6–20)
CO2: 34 mmol/L — ABNORMAL HIGH (ref 22–32)
Calcium: 8.9 mg/dL (ref 8.9–10.3)
Chloride: 95 mmol/L — ABNORMAL LOW (ref 98–111)
Creatinine, Ser: 0.87 mg/dL (ref 0.44–1.00)
GFR, Estimated: >60 mL/min (ref >60)
Glucose, Bld: 266 mg/dL — ABNORMAL HIGH (ref 70–99)
Potassium: 4.1 mmol/L (ref 3.5–5.1)
Sodium: 139 mmol/L (ref 135–145)

## 2020-01-24 LAB — GLUCOSE, CAPILLARY
Glucose-Capillary: 216 mg/dL — ABNORMAL HIGH (ref 70–99)
Glucose-Capillary: 220 mg/dL — ABNORMAL HIGH (ref 70–99)
Glucose-Capillary: 240 mg/dL — ABNORMAL HIGH (ref 70–99)
Glucose-Capillary: 245 mg/dL — ABNORMAL HIGH (ref 70–99)
Glucose-Capillary: 291 mg/dL — ABNORMAL HIGH (ref 70–99)
Glucose-Capillary: 326 mg/dL — ABNORMAL HIGH (ref 70–99)
Glucose-Capillary: 356 mg/dL — ABNORMAL HIGH (ref 70–99)

## 2020-01-24 LAB — CBC
HCT: 34.2 % — ABNORMAL LOW (ref 36.0–46.0)
Hemoglobin: 10.3 g/dL — ABNORMAL LOW (ref 12.0–15.0)
MCH: 29.7 pg (ref 26.0–34.0)
MCHC: 30.1 g/dL (ref 30.0–36.0)
MCV: 98.6 fL (ref 80.0–100.0)
Platelets: 335 10*3/uL (ref 150–400)
RBC: 3.47 MIL/uL — ABNORMAL LOW (ref 3.87–5.11)
RDW: 14.6 % (ref 11.5–15.5)
WBC: 15.4 10*3/uL — ABNORMAL HIGH (ref 4.0–10.5)
nRBC: 0 % (ref 0.0–0.2)

## 2020-01-24 MED ORDER — DEXTROSE 5 % IV SOLN
3.0000 g | INTRAVENOUS | Status: DC
  Filled 2020-01-24: qty 3000

## 2020-01-24 MED ORDER — INSULIN GLARGINE 100 UNIT/ML ~~LOC~~ SOLN
10.0000 [IU] | Freq: Every day | SUBCUTANEOUS | Status: DC
  Administered 2020-01-24 – 2020-01-26 (×2): 10 [IU] via SUBCUTANEOUS
  Filled 2020-01-24 (×3): qty 0.1

## 2020-01-24 NOTE — Progress Notes (Signed)
Inpatient Diabetes Program Recommendations  AACE/ADA: New Consensus Statement on Inpatient Glycemic Control (2015)  Target Ranges:  Prepandial:   less than 140 mg/dL      Peak postprandial:   less than 180 mg/dL (1-2 hours)      Critically ill patients:  140 - 180 mg/dL   Lab Results  Component Value Date   GLUCAP 291 (H) 01/24/2020   HGBA1C 9.6 (H) 12/25/2019    Review of Glycemic Control Results for Nicole [name], Nicole [name] (MRN [id number]) as of 01/24/2020 13:42  Ref. Range 01/24/2020 00:26 01/24/2020 04:00 01/24/2020 07:36 01/24/2020 09:05 01/24/2020 11:57  Glucose-Capillary Latest Ref Range: 70 - 99 mg/dL 005 (H) 110 (H) 211 (H) 245 (H) 291 (H)   Diabetes history:  DM2 Outpatient Diabetes medications:  Levemir 100 units BID Novolog 30 units TID Glipzide 10 BID Trulicity 1.5 weekly Current orders for Inpatient glycemic control:  Novolog 0-15 units Q4H  Inpatient Diabetes Program Recommendations:    Levemir 40 units daily (weight based 131.5 kg/0.3 units)  Will continue to follow while inpatient.  Thank you, Dulce Sellar, RN, BSN Diabetes Coordinator Inpatient Diabetes Program [phone] (team pager from 8a-5p);

## 2020-01-24 NOTE — Progress Notes (Addendum)
attempted to call report x3 to [facility] at [phone], no answer at this time. Report given to [facility]. Await on transport.   [phone]- attempted to call daughter, Misty Stanley at [phone], no answer at this time.

## 2020-01-24 NOTE — Progress Notes (Signed)
PROGRESS NOTE    Nicole [name]  OJJ:[id number] DOB: 22-Mar-1965 DOA: 01/22/2020 PCP: [name], MD    Brief Narrative:  Mrs. Parrella was admitted to the hospital with a working diagnosis of left foot cellulitis, infected diabetic foot ulcer/osteomyelitis complicated by sepsis, present on admission.  54 year old female with past medical history for COPD, dyslipidemia, hypertension, obesity class III, type 2 diabetes mellitus, coronary artery disease, diastolic heart failure, atrial fibrillation, and obstructive sleep apnea who presents with left foot pain.  She had a recent hospitalization 10/13-10/18 for left diabetic foot infection/cellulitis, received antibiotic therapy, and was discharged home with home health services.  On the day of admission the wound was evaluated by home health, it was noted to be significantly worse, and was referred to the hospital.  She had persistent and worsening left foot pain.  On her initial physical examination her temperature was 98.2, heart rate 111, blood pressure 133/58, respiratory rate 28, oxygen saturation 95%, her lungs are clear to auscultation bilaterally, heart S1-S2, present rhythmic, soft abdomen, no lower extremity edema.  Her left foot had positive edema, positive erythema and ulcerative lesions on the plantar surface. Sodium 135, potassium 3.2, chloride 94, bicarb 29, glucose 371, white count 19.2, hemoglobin 11.0, hematocrit 35.4, platelets 339.  SARS COVID-19 negative.  Urinalysis more than 300 protein, specific gravity 1.024, 0-5 white cells.  Left foot radiograph with extensive soft tissue edema and subcutaneous emphysema, throughout the left foot compatible with infection.  Cortical irregularity lateral margin of the distal tuft first distal phalanx consistent with osteomyelitis.  Patient was placed on broad-spectrum antibiotic therapy, orthopedics was consulted, and patient has been transferred to [facility] from [facility] for  further surgical orthopedic intervention.   Assessment & Plan:   Principal Problem:   Sepsis due to Lt foot osteo Active Problems:   Coronary atherosclerosis of native coronary artery/Stent in 2006   Diabetes mellitus type 2 in obese (HCC)   HTN (hypertension), benign   COPD (chronic obstructive pulmonary disease) (HCC)   Diabetic ulcer of left foot (HCC)   Cellulitis of left foot   Osteomyelitis of left foot (HCC)   Leukocytosis   Hypokalemia   Hypoalbuminemia   Hyperglycemia due to diabetes mellitus (HCC)   Morbid obesity with body mass index (BMI) of 50.0 to 59.9 in adult [facility])   Tobacco abuse--2 P/day Smoker   1. Left foot osteomyelitis, diabetic foot infection, complicated with sepsis present on admission.  Continue to have leukocytosis, up to 15,4, her cultures remain with no growth, she continue to be hemodynamically stable.   Continue antibiotic therapy with vancomycin and zosyn, plan for surgical intervention in am per Dr Lajoyce Corners from orthopedics, NPO past midnight.  Pain control with oxycodone as needed.   2. HTN/ CAD. Continue blood pressure control with metoprolol and lisinopril.   3. Uncontrolled T2DM/ dyslipidemia. Positive hyperglycemia, at 266 mg /dl fasting this am, capillary 216, 245, 291.   Add basal insulin 10 units now and continue sliding scale for glucose cover and monitoring, patient will be NPO past midnight for orthopedic procedure.   Continue with atorvastatin.   4. Hypokalemia, hypomagnesemia, metabolic alkalosis. K has been corrected at 4,1. Continue close monitoring of electrolytes.  Renal function stable with serum cr at 0,87 and bicarbonate at 34.   5. Obesity class 3. BMI more than 40, continue outpatient follow up. Noted high bicarbonate, will need outpatient sleep study.   6. Chronic atrial fibrillation. Continue rate control with metoprolol,  holding on apixaban in preparation for surgical procedure.   7. COPD/ tobacco abuse. No clinical  signs of exacerbation.  Continue with nicotine patch.  8. Depression. Continue with duloxetine.   Patient continue to be at high risk for worsening foot infection.   Status is: Inpatient  Remains inpatient appropriate because:IV treatments appropriate due to intensity of illness or inability to take PO   Dispo: The patient is from: Home              Anticipated d/c is to: Home              Anticipated d/c date is: 3 days              Patient currently is not medically stable to d/c.       DVT prophylaxis: scd   Code Status:   full  Family Communication:  No family at the beside       Consultants:   Orthopedics   Antimicrobials:   Vancomycin and zosyn     Subjective: Patient with no nausea or vomiting, no dyspnea or chest pain, no significant left foot pain   Objective: Vitals:   01/23/20 2300 01/24/20 0359 01/24/20 0722 01/24/20 0856  BP:  134/76  134/83  Pulse: (!) 106 (!) 101  98  Resp: 20 18  19   Temp:  (!) 96.9 F (36.1 C)  98.1 F (36.7 C)  TempSrc:    Oral  SpO2: 99% 100% 100% 98%  Weight:      Height:        Intake/Output Summary (Last 24 hours) at 01/24/2020 1426 Last data filed at 01/24/2020 0958 Gross per 24 hour  Intake 1631.94 ml  Output --  Net 1631.94 ml   Filed Weights   01/22/20 1508  Weight: 131.5 kg    Examination:   General: Not in pain or dyspnea. Deconditioned  Neurology: Awake and alert, non focal  E ENT: mild pallor, no icterus, oral mucosa moist Cardiovascular: No JVD. S1-S2 present, rhythmic, no gallops, rubs, or murmurs. ++ non pitting left foot edema, dressing in place. . Pulmonary: positive breath sounds bilaterally, adequate air movement, no wheezing, rhonchi or rales. Gastrointestinal. Abdomen soft and non tender, protuberant Skin. No rashes Musculoskeletal: no joint deformities     Data Reviewed: I have personally reviewed following labs and imaging studies  CBC: Recent Labs  Lab 01/22/20 1701  01/23/20 0421 01/24/20 0522  WBC 19.2* 17.1* 15.4*  NEUTROABS 14.1*  --   --   HGB 11.0* 11.0* 10.3*  HCT 35.4* 34.7* 34.2*  MCV 94.7 94.8 98.6  PLT 339 328 335   Basic Metabolic Panel: Recent Labs  Lab 01/22/20 1701 01/23/20 0421 01/24/20 0522  NA 135 139 139  K 3.2* 3.6 4.1  CL 94* 97* 95*  CO2 29 32 34*  GLUCOSE 371* 131* 266*  BUN 16 15 16   CREATININE 0.88 0.74 0.87  CALCIUM 8.5* 8.6* 8.9  MG  --  1.6*  --   PHOS  --  3.7  --    GFR: Estimated Creatinine Clearance: 96.9 mL/min (by C-G formula based on SCr of 0.87 mg/dL). Liver Function Tests: Recent Labs  Lab 01/22/20 1701 01/23/20 0421  AST 37 31  ALT 30 27  ALKPHOS 155* 140*  BILITOT 0.8 0.7  PROT 7.4 6.8  ALBUMIN 2.5* 2.5*   No results for input(s): LIPASE, AMYLASE in the last 168 hours. No results for input(s): AMMONIA in the last 168 hours. Coagulation  Profile: Recent Labs  Lab 01/23/20 0421  INR 1.4*   Cardiac Enzymes: No results for input(s): CKTOTAL, CKMB, CKMBINDEX, TROPONINI in the last 168 hours. BNP (last 3 results) No results for input(s): PROBNP in the last 8760 hours. HbA1C: No results for input(s): HGBA1C in the last 72 hours. CBG: Recent Labs  Lab 01/24/20 0026 01/24/20 0400 01/24/20 0736 01/24/20 0905 01/24/20 1157  GLUCAP 220* 240* 216* 245* 291*   Lipid Profile: No results for input(s): CHOL, HDL, LDLCALC, TRIG, CHOLHDL, LDLDIRECT in the last 72 hours. Thyroid Function Tests: No results for input(s): TSH, T4TOTAL, FREET4, T3FREE, THYROIDAB in the last 72 hours. Anemia Panel: No results for input(s): VITAMINB12, FOLATE, FERRITIN, TIBC, IRON, RETICCTPCT in the last 72 hours.    Radiology Studies: I have reviewed all of the imaging during this hospital visit personally     Scheduled Meds: . atorvastatin  40 mg Oral Daily  . DULoxetine  30 mg Oral BID  . feeding supplement (GLUCERNA SHAKE)  237 mL Oral BID  . heparin injection (subcutaneous)  5,000 Units  Subcutaneous Q8H  . insulin aspart  0-15 Units Subcutaneous Q4H  . lisinopril  2.5 mg Oral Daily  . metoprolol tartrate  50 mg Oral BID  . mometasone-formoterol  2 puff Inhalation BID  . nicotine  21 mg Transdermal Daily  . pantoprazole  40 mg Oral Daily   Continuous Infusions: . [START ON 01/25/2020]  ceFAZolin (ANCEF) IV    . piperacillin-tazobactam (ZOSYN)  IV 3.375 g (01/24/20 1357)  . vancomycin 1,250 mg (01/24/20 1122)     LOS: 2 days        Gianny Killman Annett Gula, MD

## 2020-01-24 NOTE — Consult Note (Signed)
ORTHOPAEDIC CONSULTATION  REQUESTING PHYSICIAN: Roxan Hockey, MD  Chief Complaint: Abscess ulceration osteomyelitis left foot.  HPI: Nicole [name] is a 54 y.o. female who presents with abscess ulceration osteomyelitis involving the entire left foot.  Patient states she was hospitalized in October for left foot infection treated with IV antibiotics she states that subsequently she was treated at a wound center she was then referred back to the hospital and was transferred here for definitive treatment.  Past Medical History:  Diagnosis Date  . CAP (community acquired pneumonia)    Streptococcus 01/2011  . COPD (chronic obstructive pulmonary disease) ([place])   . Coronary atherosclerosis of native coronary artery    Diagnosed [place] 2006 - DES RCA, reports followup cath [facility] at [facility]   . Dyslipidemia   . Essential hypertension, benign   . Morbid obesity ([place])   . Pancreatitis   . Type 2 diabetes mellitus ([place])    Past Surgical History:  Procedure Laterality Date  . ABDOMINAL HERNIA REPAIR    . ABDOMINAL HYSTERECTOMY    . CORONARY ANGIOPLASTY WITH STENT PLACEMENT  2006  . KNEE SURGERY    . NOSE SURGERY    . Pilonidal cystectomy    . TONSILLECTOMY     Social History   Socioeconomic History  . Marital status: Married    Spouse name: Not on file  . Number of children: 3  . Years of education: Not on file  . Highest education level: Not on file  Occupational History  . Occupation: Home health aide    Employer: [employer]  Tobacco Use  . Smoking status: Current Every Day Smoker    Packs/day: 0.50    Years: 20.00    Pack years: 10.00    Types: Cigarettes  . Smokeless tobacco: Never Used  Vaping Use  . Vaping Use: Never used  Substance and Sexual Activity  . Alcohol use: No  . Drug use: No  . Sexual activity: Yes    Birth control/protection: Surgical  Other Topics Concern  . Not on file  Social History Narrative  . Not on file    Social Determinants of Health   Financial Resource Strain:   . Difficulty of Paying Living Expenses: Not on file  Food Insecurity:   . Worried About Charity fundraiser in the Last Year: Not on file  . Ran Out of Food in the Last Year: Not on file  Transportation Needs:   . Lack of Transportation (Medical): Not on file  . Lack of Transportation (Non-Medical): Not on file  Physical Activity:   . Days of Exercise per Week: Not on file  . Minutes of Exercise per Session: Not on file  Stress:   . Feeling of Stress : Not on file  Social Connections:   . Frequency of Communication with Friends and Family: Not on file  . Frequency of Social Gatherings with Friends and Family: Not on file  . Attends Religious Services: Not on file  . Active Member of Clubs or Organizations: Not on file  . Attends Archivist Meetings: Not on file  . Marital Status: Not on file   Family History  Problem Relation Age of Onset  . Diabetes Mother   . Heart failure Mother   . Hypertension Mother   . Kidney failure Mother   . Ovarian cancer Mother   . Asthma Son    - negative except otherwise stated in the family history section Allergies  Allergen Reactions  . Aspirin Anaphylaxis and Other (See Comments)    Throat closing   . Bee Pollen Anaphylaxis  . Bee Venom Anaphylaxis  . Food Anaphylaxis    PINEAPPLE  . Pineapple Anaphylaxis  . E-Mycin [Erythromycin Base] Nausea And Vomiting  . Other Rash   Prior to Admission medications   Medication Sig Start Date End Date Taking? Authorizing Provider  albuterol (PROVENTIL) (2.5 MG/3ML) 0.083% nebulizer solution Take 3 mLs (2.5 mg total) by nebulization every 6 (six) hours as needed for wheezing. 05/25/12  Yes Tanda Rockers, MD  albuterol (VENTOLIN HFA) 108 (90 BASE) MCG/ACT inhaler Inhale 2 puffs into the lungs as needed for wheezing. Shortness of breath   Yes [provider]  apixaban (ELIQUIS) 5 MG TABS tablet Take 5 mg by mouth 2  (two) times daily.   Yes [provider]  buPROPion (WELLBUTRIN SR) 100 MG 12 hr tablet Take 1 tablet (100 mg total) by mouth 2 (two) times daily. 12/25/19  Yes Ailene Ards, NP  citalopram (CELEXA) 10 MG tablet Take 10 mg by mouth daily.   Yes [provider]  clopidogrel (PLAVIX) 75 MG tablet Take 1 tablet (75 mg total) by mouth daily. 12/25/19  Yes Ailene Ards, NP  DULoxetine (CYMBALTA) 60 MG capsule Take 1 capsule (60 mg total) by mouth 2 (two) times daily. 12/25/19  Yes Ailene Ards, NP  EPINEPHrine 0.3 mg/0.3 mL IJ SOAJ injection Inject 0.3 mg into the muscle as needed for anaphylaxis.   Yes [provider]  furosemide (LASIX) 80 MG tablet Take 1 tablet (80 mg total) by mouth daily. 12/25/19  Yes Ailene Ards, NP  glipiZIDE (GLUCOTROL) 10 MG tablet Take 1 tablet (10 mg total) by mouth in the morning and at bedtime. 12/25/19  Yes Ailene Ards, NP  insulin aspart (NOVOLOG) 100 UNIT/ML injection Inject 30 Units into the skin 3 (three) times daily with meals.    Yes [provider]  insulin regular (NOVOLIN R) 100 units/mL injection Inject 100 Units into the skin See admin instructions. 0-20 per sliding scale   Yes [provider]  lisinopril (ZESTRIL) 2.5 MG tablet Take 2.5 mg by mouth daily.   Yes [provider]  loperamide (IMODIUM) 2 MG capsule Take 1 capsule (2 mg total) by mouth every 6 (six) hours as needed for diarrhea or loose stools. 12/10/19  Yes Kathie Dike, MD  metoprolol tartrate (LOPRESSOR) 50 MG tablet Take 1 tablet (50 mg total) by mouth 2 (two) times daily. 12/25/19  Yes Ailene Ards, NP  Multiple Vitamins-Calcium (ONE-A-DAY WOMENS FORMULA) TABS Take 1 tablet by mouth daily.   Yes [provider]  mupirocin ointment (BACTROBAN) 2 % Apply topically daily. 12/10/19  Yes Kathie Dike, MD  pantoprazole (PROTONIX) 40 MG tablet Take 1 tablet (40 mg total) by mouth daily. Take 30-60 min before first meal of the day  12/25/19  Yes Ailene Ards, NP  simvastatin (ZOCOR) 80 MG tablet Take 80 mg by mouth daily.   Yes [provider]  TRULICITY 1.5 FB/5.1WC SOPN Inject 1.5 mg into the skin once a week.  11/05/19  Yes [provider]  amoxicillin-clavulanate (AUGMENTIN) 875-125 MG tablet Take 1 tablet by mouth every 12 (twelve) hours. Patient not taking: Reported on 01/22/2020 12/10/19   Kathie Dike, MD  doxycycline (VIBRA-TABS) 100 MG tablet Take 1 tablet (100 mg total) by mouth every 12 (twelve) hours. Patient not taking: Reported on 01/22/2020 12/10/19  Kathie Dike, MD  EASY TOUCH INSULIN SYRINGE 31G X 5/16" 1 ML MISC  09/21/19   [provider]  insulin detemir (LEVEMIR) 100 UNIT/ML injection Inject 100 Units into the skin 2 (two) times daily.     [provider]  SUPREP BOWEL PREP KIT 17.5-3.13-1.6 GM/177ML SOLN SMARTSIG:354 Milliliter(s) By Mouth As Directed Patient not taking: Reported on 12/25/2019 08/31/19   [provider]   DG Foot Complete Left  Result Date: 01/22/2020 CLINICAL DATA:  Left great toe and first metatarsal infection, diabetes, swelling EXAM: LEFT FOOT - COMPLETE 3+ VIEW COMPARISON:  12/05/2019 FINDINGS: Frontal, oblique, lateral views of the left foot are obtained. There is extensive soft tissue swelling throughout the left foot. Subcutaneous gas is seen throughout the left forefoot, greatest between the first and second metatarsal heads. There is cortical irregularity along the lateral margin of the distal tuft of the first distal phalanx concerning for osteomyelitis. No other acute or destructive bony lesions. Prior fifth digit AP taken at the metatarsophalangeal joint. IMPRESSION: 1. Extensive soft tissue swelling and subcutaneous emphysema throughout the left foot, compatible with infection. 2. Cortical irregularity lateral margin distal tuft first distal phalanx, consistent with osteomyelitis. Electronically Signed   By: Randa Ngo M.D.    On: 01/22/2020 15:51   - pertinent xrays, CT, MRI studies were reviewed and independently interpreted  Positive ROS: All other systems have been reviewed and were otherwise negative with the exception of those mentioned in the HPI and as above.  Physical Exam: General: Alert, no acute distress Psychiatric: Patient is competent for consent with normal mood and affect Lymphatic: No axillary or cervical lymphadenopathy Cardiovascular: No pedal edema Respiratory: No cyanosis, no use of accessory musculature GI: No organomegaly, abdomen is soft and non-tender    Images:  @ENCIMAGES @  Labs:  Lab Results  Component Value Date   HGBA1C 9.6 (H) 12/25/2019   HGBA1C 6.9 (H) 04/10/2012   HGBA1C 8.5 (H) 02/19/2011   ESRSEDRATE 67 (H) 12/05/2019   CRP 8.7 (H) 12/05/2019   REPTSTATUS PENDING 01/22/2020   GRAMSTAIN  02/19/2011    ABUNDANT WBC PRESENT,BOTH PMN AND MONONUCLEAR RARE SQUAMOUS EPITHELIAL CELLS PRESENT FEW GRAM POSITIVE COCCI IN PAIRS RARE GRAM NEGATIVE RODS RARE GRAM POSITIVE RODS   CULT  01/22/2020    NO GROWTH 2 DAYS Performed at [facility] [address] [place], [place] [address]     Lab Results  Component Value Date   ALBUMIN 2.5 (L) 01/23/2020   ALBUMIN 2.5 (L) 01/22/2020   ALBUMIN 2.9 (L) 12/06/2019   PREALBUMIN 17.9 (L) 12/05/2019    Neurologic: Patient does not have protective sensation bilateral lower extremities.   MUSCULOSKELETAL:   Skin: Examination patient has cellulitis of the left foot that extends up to mid calf on the left lower extremity.  She has a palpable pulse on both feet.  She has necrotic ulcerative tissue involving the entire forefoot with ulcerations and purulent drainage.  Hemoglobin A1c 9.6 albumin 2.5.  Radiographs show massive swelling of the left foot destructive bony changes of the great toe and air in the soft tissue.  Assessment: Assessment: Abscess ulceration osteomyelitis left foot with uncontrolled type 2 diabetes  and severe protein caloric malnutrition with morbid obesity BMI greater than 51.  Plan: Plan: We will plan for a left below the knee amputation.  Discussed that with the cellulitis extending into the leg there may be a risk of more proximal infection that would require prolonged IV antibiotics discussed that we  can evaluate this at the time of surgery.  Patient would need at least 5 days of IV antibiotics postoperatively with the cellulitis involving her leg.  Plan for left below the knee amputation surgery today.  I called her daughter while in her room and there was no answer I will try to recontact her daughter.  Thank you for the consult and the opportunity to see Ms. Alicia Amel, MD [facility] [phone] 9:41 AM

## 2020-01-24 NOTE — Plan of Care (Signed)
Pt still uneasy about procedure possibly happening tomorrow. She wants her daughter to be updated with all information. Vital signs have been stable and pt states she has no pain. Blood sugars have remained high even with insulin coverage. Will continue to monitor.   Problem: Education: Goal: Knowledge of General Education information will improve Description: Including pain rating scale, medication(s)/side effects and non-pharmacologic comfort measures Outcome: Progressing   Problem: Nutrition: Goal: Adequate nutrition will be maintained Outcome: Progressing   Problem: Coping: Goal: Level of anxiety will decrease Outcome: Progressing   Problem: Pain Managment: Goal: General experience of comfort will improve Outcome: Progressing   Problem: Safety: Goal: Ability to remain free from injury will improve Outcome: Progressing

## 2020-01-24 NOTE — Progress Notes (Signed)
0900 Pt arrived to Unit. Never received report from [facility] Nurse. Note states nurse from [facility] called but must have called before my shift started. Night shift nurse stated she didn't receive report either.

## 2020-01-24 NOTE — Progress Notes (Signed)
Patient Demographics:    Nicole [name], is a 54 y.o. female, DOB - Jul 11, 1965, CHE:[id number]  Admit date - 01/22/2020   Admitting Physician Bernadette Hoit, DO  Outpatient Primary MD for the patient is Doree Albee, MD  LOS - 2   Chief Complaint  Patient presents with  . Foot Pain        Subjective:    Nicole [name] today has no fevers, no emesis,  No chest pain,   -pt seen at [facility] prior to transfer to [facility] on 01/24/20  Assessment  & Plan :    Principal Problem:   Sepsis due to Lt foot osteo Active Problems:   Diabetic ulcer of left foot ([place])   Osteomyelitis of left foot ([place])   Coronary atherosclerosis of native coronary artery/Stent in 2006   COPD (chronic obstructive pulmonary disease) (HCC)   Cellulitis of left foot   Morbid obesity with body mass index (BMI) of 50.0 to 59.9 in adult [facility])   Diabetes mellitus type 2 in obese (HCC)   HTN (hypertension), benign   Leukocytosis   Hypokalemia   Hypoalbuminemia   Hyperglycemia due to diabetes mellitus ([place])   Tobacco abuse--2 P/day Smoker   Brief Summary:-  54 y.o. female with medical history significant for morbid obesity, ongoing heavy tobacco abuse, COPD, DM2, HTN and HLD, tCAD s/p PCI with stenting in 5361, chronic diastolic CHF, A. fib on Eliquis, OSA on CPAP  readmitted on 01/22/2020 with worsening left foot cellulitis and infected diabetic ulcer with osteomyelitis -- Patient was admitted from 10/13-10/18 due to left foot diabetic ulcer with associated cellulitis during which she was treated with IV antibiotics which was transitioned to oral antibiotics on discharge, patient has been following up with outpatient wound clinic  - However left foot infection has worsened,  -Patient seen in conjunction with general surgeon Dr. Arnoldo Morale at bedside on 01/23/20, discussed with Dr. Sharol Given at [facility] who will be glad to see patient once  patient gets to [facility] -Transfer to [facility] for now evaluation by Dr. Sharol Given, patient will most likely need orthopedic/operative intervention   A/p   1)Sepsis secondary to left foot cellulitis/infected diabetic ulcer and osteomyelitis--POA -patient met sepsis criteria on admission with leukocytosis, tachycardia and tachypnea along with infected left foot --continue IV Vanco and Zosyn pending further evaluation by Dr. Sharol Given -Transfer to [facility] for now evaluation by Dr. Sharol Given, plan is for orthopedic/operative intervention on 01/25/2020 -N.p.o. after midnight -WBC is down to 15.4 from 19.2 - Dr. Meridee Score aware that pt is at [place]  2)DM2--recent A1c 9.6 reflecting uncontrolled DM -Hold Trulicity, Levemir and glipizide in the perioperative patient Use Novolog/Humalog Sliding scale insulin with Accu-Cheks/Fingersticks as ordered   3) atrial fibrillation--hold Eliquis to allow for surgical intervention, continue metoprolol for rate control --Please restart Eliquis postop if and when okay with Dr. Sharol Given (increased bleeding risk)  4) morbid obesity and OSA---CPAP nightly advised, -After resolution of left foot infection pt will be advised to follow-up low calorie diet, portion control and increase physical activity discussed with patient -Body mass index is 51.37 kg/m.  5)CAD--status post prior angioplasty and stenting in 2006, no ACS type symptoms at this time, continue simvastatin , continue  metoprolol 50 mg twice daily -Unable to give aspirin while off Eliquis due to severe aspirin allergy -Use of Plavix may delay operative intervention -Please restart Eliquis postop if and when okay with Dr. Sharol Given (increased bleeding risk)   6) tobacco abuse and COPD----patient smokes up to 2 packs a day,  --c/n  nicotine patch, use bronchodilators as ordered  7)HFpEF--patient with history of chronic diastolic dysfunction CHF no acute exacerbation at this time--no  indication for diuretics at this time- ---continue metoprolol and lisinopril  7) depression anxiety--- stable, continue Cymbalta  8)GERD--- Protonix as ordered  9) hypokalemia/hypomagnesemia --- replace and recheck  10) chronic hypoxic respiratory failure suspect due to underlying COPD, continue supplemental oxygen at 2 to 3 L/min continuously  11) chronic anemia--- hemoglobin currently close to patient's baseline, no evidence of ongoing bleeding, -Anticipate further drop in H&H with hemodilution/hydration, as well as perioperative blood loss   Disposition/Need for in-Hospital Stay- patient unable to be discharged at this time due to --sepsis secondary to left foot infected diabetic ulcer/cellulitis and osteomyelitis requiring IV antibiotics and most likely operative intervention by Dr. Sharol Given  Status is: Inpatient  Remains inpatient appropriate because:sepsis secondary to left foot infected diabetic ulcer/cellulitis and osteomyelitis requiring IV antibiotics and most likely operative intervention by Dr. Sharol Given  Disposition: The patient is from: Home              Anticipated d/c is to: Home              Anticipated d/c date is: 2 days              Patient currently is not medically stable to d/c. Barriers: Not Clinically Stable- sepsis secondary to left foot infected diabetic ulcer/cellulitis and osteomyelitis requiring IV antibiotics and most likely operative intervention by Dr. Sharol Given   Code Status : full   Family Communication:     (patient is alert, awake and coherent)  -No family at bedside  Consults  : official consult requested from orthopedic surgeon Dr. Sharol Given  DVT Prophylaxis  :   - Heparin - SCDs    Lab Results  Component Value Date   PLT 335 01/24/2020    Inpatient Medications  Scheduled Meds: . atorvastatin  40 mg Oral Daily  . DULoxetine  30 mg Oral BID  . feeding supplement (GLUCERNA SHAKE)  237 mL Oral BID  . heparin injection (subcutaneous)  5,000 Units  Subcutaneous Q8H  . insulin aspart  0-15 Units Subcutaneous Q4H  . lisinopril  2.5 mg Oral Daily  . metoprolol tartrate  50 mg Oral BID  . mometasone-formoterol  2 puff Inhalation BID  . nicotine  21 mg Transdermal Daily  . pantoprazole  40 mg Oral Daily   Continuous Infusions: . piperacillin-tazobactam (ZOSYN)  IV 3.375 g (01/24/20 0510)  . vancomycin 1,250 mg (01/23/20 2334)   PRN Meds:.albuterol, albuterol, oxyCODONE    Anti-infectives (From admission, onward)   Start     Dose/Rate Route Frequency Ordered Stop   01/23/20 1100  vancomycin (VANCOREADY) IVPB 1250 mg/250 mL        1,250 mg 166.7 mL/hr over 90 Minutes Intravenous Every 12 hours 01/22/20 2244     01/23/20 0200  piperacillin-tazobactam (ZOSYN) IVPB 3.375 g        3.375 g 12.5 mL/hr over 240 Minutes Intravenous Every 8 hours 01/22/20 2207     01/23/20 0000  piperacillin-tazobactam (ZOSYN) IVPB 3.375 g  Status:  Discontinued        3.375 g  100 mL/hr over 30 Minutes Intravenous Every 6 hours 01/22/20 2204 01/22/20 2207   01/22/20 2315  vancomycin (VANCOCIN) IVPB 1000 mg/200 mL premix        1,000 mg 200 mL/hr over 60 Minutes Intravenous  Once 01/22/20 2244 01/23/20 0016   01/22/20 2215  vancomycin (VANCOCIN) IVPB 1000 mg/200 mL premix        1,000 mg 200 mL/hr over 60 Minutes Intravenous  Once 01/22/20 2204 01/22/20 2310   01/22/20 1700  piperacillin-tazobactam (ZOSYN) IVPB 3.375 g        3.375 g 100 mL/hr over 30 Minutes Intravenous  Once 01/22/20 1658 01/22/20 1844        Objective:   Vitals:   01/23/20 2022 01/23/20 2300 01/24/20 0359 01/24/20 0722  BP: (!) 132/43  134/76   Pulse: (!) 101 (!) 106 (!) 101   Resp: _0 Temp: 97.9 F (36.6 C)  (!) 96.9 F (36.1 C)   TempSrc:      SpO2: 100% 99% 100% 100%  Weight:      Height:        Wt Readings from Last 3 Encounters:  01/22/20 131.5 kg  12/25/19 133.4 kg  12/05/19 131.1 kg     Intake/Output Summary (Last 24 hours) at 01/24/2020 0805 Last  data filed at 01/24/2020 0445 Gross per 24 hour  Intake 1443.8 ml  Output --  Net 1443.8 ml    Physical Exam  Gen:- Awake Alert, morbidly obese HEENT:- [place].AT, No sclera icterus Nose- [place] 2L/min Neck-Supple Neck,No JVD,.  Lungs-diminished in bases, no significant wheezing CV- S1, S2 normal, regular  Abd-  +ve B.Sounds, Abd Soft, No tenderness, increased truncal adiposity    Extremity/Skin:- pedal pulses present  Psych-affect is appropriate, oriented x3 Neuro-no new focal deficits, no tremors MSK- Lt Foot with significant erythema, swelling, tenderness, warmth, and foul-smelling drainage--- there is a hole/defect between the first and second interdigital space--please see photos in epic   Data Review:   Micro Results Recent Results (from the past 240 hour(s))  Blood culture (routine x 2)     Status: None (Preliminary result)   Collection Time: 01/22/20  5:01 PM   Specimen: BLOOD LEFT FOREARM  Result Value Ref Range Status   Specimen Description BLOOD LEFT FOREARM  Final   Special Requests   Final    BOTTLES DRAWN AEROBIC AND ANAEROBIC Blood Culture adequate volume   Culture   Final    NO GROWTH 2 DAYS Performed at [facility], [address], [place], [place] [address]    Report Status PENDING  Incomplete  Blood culture (routine x 2)     Status: None (Preliminary result)   Collection Time: 01/22/20  5:24 PM   Specimen: BLOOD RIGHT FOREARM  Result Value Ref Range Status   Specimen Description BLOOD RIGHT FOREARM  Final   Special Requests   Final    BOTTLES DRAWN AEROBIC AND ANAEROBIC Blood Culture adequate volume   Culture   Final    NO GROWTH 2 DAYS Performed at [facility], [address], [place], [place] [address]    Report Status PENDING  Incomplete  Resp Panel by RT-PCR (Flu A&B, Covid) Nasopharyngeal Swab     Status: None   Collection Time: 01/22/20  5:24 PM   Specimen: Nasopharyngeal Swab; Nasopharyngeal(NP) swabs in vial transport medium  Result Value Ref Range  Status   SARS Coronavirus 2 by RT PCR NEGATIVE NEGATIVE Final    Comment: (NOTE) SARS-CoV-2 target  nucleic acids are NOT DETECTED.  The SARS-CoV-2 RNA is generally detectable in upper respiratory specimens during the acute phase of infection. The lowest concentration of SARS-CoV-2 viral copies this assay can detect is 138 copies/mL. A negative result does not preclude SARS-Cov-2 infection and should not be used as the sole basis for treatment or other patient management decisions. A negative result may occur with  improper specimen collection/handling, submission of specimen other than nasopharyngeal swab, presence of viral mutation(s) within the areas targeted by this assay, and inadequate number of viral copies(<138 copies/mL). A negative result must be combined with clinical observations, patient history, and epidemiological information. The expected result is Negative.  Fact Sheet for Patients:  EntrepreneurPulse.com.au  Fact Sheet for Healthcare Providers:  IncredibleEmployment.be  This test is no t yet approved or cleared by the [place] FDA and  has been authorized for detection and/or diagnosis of SARS-CoV-2 by FDA under an Emergency Use Authorization (EUA). This EUA will remain  in effect (meaning this test can be used) for the duration of the COVID-19 declaration under Section 564(b)(1) of the Act, 21 U.S.C.section 360bbb-3(b)(1), unless the authorization is terminated  or revoked sooner.       Influenza A by PCR NEGATIVE NEGATIVE Final   Influenza B by PCR NEGATIVE NEGATIVE Final    Comment: (NOTE) The Xpert Xpress SARS-CoV-2/FLU/RSV plus assay is intended as an aid in the diagnosis of influenza from Nasopharyngeal swab specimens and should not be used as a sole basis for treatment. Nasal washings and aspirates are unacceptable for Xpert Xpress SARS-CoV-2/FLU/RSV testing.  Fact Sheet for  Patients: EntrepreneurPulse.com.au  Fact Sheet for Healthcare Providers: IncredibleEmployment.be  This test is not yet approved or cleared by the [place] FDA and has been authorized for detection and/or diagnosis of SARS-CoV-2 by FDA under an Emergency Use Authorization (EUA). This EUA will remain in effect (meaning this test can be used) for the duration of the COVID-19 declaration under Section 564(b)(1) of the Act, 21 U.S.C. section 360bbb-3(b)(1), unless the authorization is terminated or revoked.  Performed at [facility], [address], [place], [place] [address]     Radiology Reports DG Foot Complete Left  Result Date: 01/22/2020 CLINICAL DATA:  Left great toe and first metatarsal infection, diabetes, swelling EXAM: LEFT FOOT - COMPLETE 3+ VIEW COMPARISON:  12/05/2019 FINDINGS: Frontal, oblique, lateral views of the left foot are obtained. There is extensive soft tissue swelling throughout the left foot. Subcutaneous gas is seen throughout the left forefoot, greatest between the first and second metatarsal heads. There is cortical irregularity along the lateral margin of the distal tuft of the first distal phalanx concerning for osteomyelitis. No other acute or destructive bony lesions. Prior fifth digit AP taken at the metatarsophalangeal joint. IMPRESSION: 1. Extensive soft tissue swelling and subcutaneous emphysema throughout the left foot, compatible with infection. 2. Cortical irregularity lateral margin distal tuft first distal phalanx, consistent with osteomyelitis. Electronically Signed   By: Randa Ngo M.D.   On: 01/22/2020 15:51     CBC Recent Labs  Lab 01/22/20 1701 01/23/20 0421 01/24/20 0522  WBC 19.2* 17.1* 15.4*  HGB 11.0* 11.0* 10.3*  HCT 35.4* 34.7* 34.2*  PLT 339 328 335  MCV 94.7 94.8 98.6  MCH 29.4 30.1 29.7  MCHC 31.1 31.7 30.1  RDW 14.4 14.4 14.6  LYMPHSABS 2.8  --   --   MONOABS 1.5*  --   --   EOSABS  0.4  --   --  BASOSABS 0.1  --   --     Chemistries  Recent Labs  Lab 01/22/20 1701 01/23/20 0421 01/24/20 0522  NA 135 139 139  K 3.2* 3.6 4.1  CL 94* 97* 95*  CO2 29 32 34*  GLUCOSE 371* 131* 266*  BUN _0 CREATININE 0.88 0.74 0.87  CALCIUM 8.5* 8.6* 8.9  MG  --  1.6*  --   AST 37 31  --   ALT 30 27  --   ALKPHOS 155* 140*  --   BILITOT 0.8 0.7  --    ------------------------------------------------------------------------------------------------------------------ No results for input(s): CHOL, HDL, LDLCALC, TRIG, CHOLHDL, LDLDIRECT in the last 72 hours.  Lab Results  Component Value Date   HGBA1C 9.6 (H) 12/25/2019   ------------------------------------------------------------------------------------------------------------------ No results for input(s): TSH, T4TOTAL, T3FREE, THYROIDAB in the last 72 hours.  Invalid input(s): FREET3 ------------------------------------------------------------------------------------------------------------------ No results for input(s): VITAMINB12, FOLATE, FERRITIN, TIBC, IRON, RETICCTPCT in the last 72 hours.  Coagulation profile Recent Labs  Lab 01/23/20 0421  INR 1.4*    No results for input(s): DDIMER in the last 72 hours.  Cardiac Enzymes No results for input(s): CKMB, TROPONINI, MYOGLOBIN in the last 168 hours.  Invalid input(s): CK ------------------------------------------------------------------------------------------------------------------ No results found for: BNP   Roxan Hockey M.D on 01/24/2020 at 8:05 AM  Go to [email or web address] - for contact info  [facility] - Office  [phone]

## 2020-01-25 ENCOUNTER — Inpatient Hospital Stay (HOSPITAL_COMMUNITY): Payer: Medicare (Managed Care) | Admitting: Anesthesiology

## 2020-01-25 ENCOUNTER — Encounter (HOSPITAL_COMMUNITY)
Admission: EM | Disposition: A | Discharge status: Home Health | Payer: Self-pay | Source: Home / Self Care | Attending: Internal Medicine

## 2020-01-25 ENCOUNTER — Encounter (HOSPITAL_COMMUNITY): Payer: Self-pay | Admitting: Internal Medicine

## 2020-01-25 ENCOUNTER — Ambulatory Visit (HOSPITAL_COMMUNITY): Payer: Medicare (Managed Care) | Admitting: Physical Therapy

## 2020-01-25 DIAGNOSIS — E669 Obesity, unspecified: Secondary | ICD-10-CM

## 2020-01-25 DIAGNOSIS — Z6841 Body Mass Index (BMI) 40.0 and over, adult: Secondary | ICD-10-CM

## 2020-01-25 DIAGNOSIS — R6520 Severe sepsis without septic shock: Secondary | ICD-10-CM

## 2020-01-25 DIAGNOSIS — J449 Chronic obstructive pulmonary disease, unspecified: Secondary | ICD-10-CM

## 2020-01-25 DIAGNOSIS — L03116 Cellulitis of left lower limb: Secondary | ICD-10-CM | POA: Diagnosis not present

## 2020-01-25 DIAGNOSIS — A419 Sepsis, unspecified organism: Principal | ICD-10-CM

## 2020-01-25 DIAGNOSIS — E08621 Diabetes mellitus due to underlying condition with foot ulcer: Secondary | ICD-10-CM

## 2020-01-25 DIAGNOSIS — M86172 Other acute osteomyelitis, left ankle and foot: Secondary | ICD-10-CM

## 2020-01-25 DIAGNOSIS — J9601 Acute respiratory failure with hypoxia: Secondary | ICD-10-CM

## 2020-01-25 DIAGNOSIS — M86272 Subacute osteomyelitis, left ankle and foot: Secondary | ICD-10-CM

## 2020-01-25 DIAGNOSIS — E1169 Type 2 diabetes mellitus with other specified complication: Secondary | ICD-10-CM

## 2020-01-25 DIAGNOSIS — Z720 Tobacco use: Secondary | ICD-10-CM

## 2020-01-25 DIAGNOSIS — L97424 Non-pressure chronic ulcer of left heel and midfoot with necrosis of bone: Secondary | ICD-10-CM

## 2020-01-25 DIAGNOSIS — E8809 Other disorders of plasma-protein metabolism, not elsewhere classified: Secondary | ICD-10-CM

## 2020-01-25 HISTORY — PX: AMPUTATION: SHX166

## 2020-01-25 LAB — CBC WITH DIFFERENTIAL/PLATELET
Abs Immature Granulocytes: 0.66 10*3/uL — ABNORMAL HIGH (ref 0.00–0.07)
Basophils Absolute: 0.1 10*3/uL (ref 0.0–0.1)
Basophils Relative: 1 %
Eosinophils Absolute: 0.5 10*3/uL (ref 0.0–0.5)
Eosinophils Relative: 3 %
HCT: 33.4 % — ABNORMAL LOW (ref 36.0–46.0)
Hemoglobin: 10.2 g/dL — ABNORMAL LOW (ref 12.0–15.0)
Immature Granulocytes: 4 %
Lymphocytes Relative: 17 %
Lymphs Abs: 2.9 10*3/uL (ref 0.7–4.0)
MCH: 29.1 pg (ref 26.0–34.0)
MCHC: 30.5 g/dL (ref 30.0–36.0)
MCV: 95.4 fL (ref 80.0–100.0)
Monocytes Absolute: 1.1 10*3/uL — ABNORMAL HIGH (ref 0.1–1.0)
Monocytes Relative: 6 %
Neutro Abs: 12.2 10*3/uL — ABNORMAL HIGH (ref 1.7–7.7)
Neutrophils Relative %: 69 %
Platelets: 346 10*3/uL (ref 150–400)
RBC: 3.5 MIL/uL — ABNORMAL LOW (ref 3.87–5.11)
RDW: 14.4 % (ref 11.5–15.5)
WBC: 17.5 10*3/uL — ABNORMAL HIGH (ref 4.0–10.5)
nRBC: 0 % (ref 0.0–0.2)

## 2020-01-25 LAB — BASIC METABOLIC PANEL
Anion gap: 12 (ref 5–15)
BUN: 14 mg/dL (ref 6–20)
CO2: 28 mmol/L (ref 22–32)
Calcium: 9.1 mg/dL (ref 8.9–10.3)
Chloride: 98 mmol/L (ref 98–111)
Creatinine, Ser: 0.89 mg/dL (ref 0.44–1.00)
GFR, Estimated: >60 mL/min (ref >60)
Glucose, Bld: 392 mg/dL — ABNORMAL HIGH (ref 70–99)
Potassium: 4.3 mmol/L (ref 3.5–5.1)
Sodium: 138 mmol/L (ref 135–145)

## 2020-01-25 LAB — GLUCOSE, CAPILLARY
Glucose-Capillary: 364 mg/dL — ABNORMAL HIGH (ref 70–99)
Glucose-Capillary: 366 mg/dL — ABNORMAL HIGH (ref 70–99)
Glucose-Capillary: 306 mg/dL — ABNORMAL HIGH (ref 70–99)
Glucose-Capillary: 236 mg/dL — ABNORMAL HIGH (ref 70–99)
Glucose-Capillary: 136 mg/dL — ABNORMAL HIGH (ref 70–99)
Glucose-Capillary: 250 mg/dL — ABNORMAL HIGH (ref 70–99)

## 2020-01-25 LAB — SURGICAL PCR SCREEN
MRSA, PCR: NEGATIVE
Staphylococcus aureus: NEGATIVE

## 2020-01-25 SURGERY — AMPUTATION BELOW KNEE
Anesthesia: General | Site: Knee | Laterality: Left

## 2020-01-25 MED ORDER — HEPARIN SODIUM (PORCINE) 5000 UNIT/ML IJ SOLN
5000.0000 [IU] | Freq: Three times a day (TID) | INTRAMUSCULAR | Status: DC
  Administered 2020-01-26: 5000 [IU] via SUBCUTANEOUS
  Filled 2020-01-25: qty 1

## 2020-01-25 MED ORDER — FENTANYL CITRATE (PF) 100 MCG/2ML IJ SOLN: 50.0000 ug | Freq: Once | INTRAMUSCULAR | Status: AC

## 2020-01-25 MED ORDER — OXYCODONE HCL 5 MG/5ML PO SOLN: 5.0000 mg | Freq: Once | ORAL | Status: DC | PRN

## 2020-01-25 MED ORDER — ONDANSETRON HCL 4 MG/2ML IJ SOLN
INTRAMUSCULAR | Status: DC | PRN
  Administered 2020-01-25: 4 mg via INTRAVENOUS

## 2020-01-25 MED ORDER — PROPOFOL 10 MG/ML IV BOLUS
INTRAVENOUS | Status: AC
  Filled 2020-01-25: qty 20

## 2020-01-25 MED ORDER — DEXTROSE 5 % IV SOLN
INTRAVENOUS | Status: DC | PRN
  Administered 2020-01-25: 3 g via INTRAVENOUS

## 2020-01-25 MED ORDER — MEPERIDINE HCL 25 MG/ML IJ SOLN: 6.2500 mg | INTRAMUSCULAR | Status: DC | PRN

## 2020-01-25 MED ORDER — CHLORHEXIDINE GLUCONATE 0.12 % MT SOLN
15.0000 mL | OROMUCOSAL | Status: AC
  Filled 2020-01-25: qty 15

## 2020-01-25 MED ORDER — ONDANSETRON HCL 4 MG PO TABS: 4.0000 mg | ORAL_TABLET | Freq: Four times a day (QID) | ORAL | Status: DC | PRN

## 2020-01-25 MED ORDER — SODIUM CHLORIDE 0.9 % IV SOLN: INTRAVENOUS | Status: DC

## 2020-01-25 MED ORDER — BUPROPION HCL 100 MG PO TABS
100.0000 mg | ORAL_TABLET | Freq: Two times a day (BID) | ORAL | Status: DC
  Administered 2020-01-25 – 2020-02-06 (×24): 100 mg via ORAL
  Filled 2020-01-25 (×26): qty 1

## 2020-01-25 MED ORDER — LIDOCAINE 2% (20 MG/ML) 5 ML SYRINGE
INTRAMUSCULAR | Status: DC | PRN
  Administered 2020-01-25: 60 mg via INTRAVENOUS

## 2020-01-25 MED ORDER — ONDANSETRON HCL 4 MG/2ML IJ SOLN: 4.0000 mg | Freq: Four times a day (QID) | INTRAMUSCULAR | Status: DC | PRN

## 2020-01-25 MED ORDER — FENTANYL CITRATE (PF) 100 MCG/2ML IJ SOLN
INTRAMUSCULAR | Status: AC
  Administered 2020-01-25: 50 ug via INTRAVENOUS
  Filled 2020-01-25: qty 2

## 2020-01-25 MED ORDER — ROPIVACAINE HCL 7.5 MG/ML IJ SOLN
INTRAMUSCULAR | Status: DC | PRN
  Administered 2020-01-25: 20 mL via PERINEURAL

## 2020-01-25 MED ORDER — ONDANSETRON HCL 4 MG/2ML IJ SOLN
INTRAMUSCULAR | Status: AC
  Filled 2020-01-25: qty 2

## 2020-01-25 MED ORDER — ACETAMINOPHEN 325 MG PO TABS: 325.0000 mg | ORAL_TABLET | ORAL | Status: DC | PRN

## 2020-01-25 MED ORDER — METOCLOPRAMIDE HCL 5 MG PO TABS: 5.0000 mg | ORAL_TABLET | Freq: Three times a day (TID) | ORAL | Status: DC | PRN

## 2020-01-25 MED ORDER — ACETAMINOPHEN 325 MG PO TABS: 325.0000 mg | ORAL_TABLET | Freq: Four times a day (QID) | ORAL | Status: DC | PRN

## 2020-01-25 MED ORDER — MIDAZOLAM HCL 2 MG/2ML IJ SOLN: 2.0000 mg | Freq: Once | INTRAMUSCULAR | Status: AC

## 2020-01-25 MED ORDER — CLONIDINE HCL (ANALGESIA) 100 MCG/ML EP SOLN
EPIDURAL | Status: DC | PRN
  Administered 2020-01-25: 100 ug

## 2020-01-25 MED ORDER — 0.9 % SODIUM CHLORIDE (POUR BTL) OPTIME
TOPICAL | Status: DC | PRN
  Administered 2020-01-25: 1000 mL

## 2020-01-25 MED ORDER — OXYCODONE HCL 5 MG PO TABS: 5.0000 mg | ORAL_TABLET | Freq: Once | ORAL | Status: DC | PRN

## 2020-01-25 MED ORDER — LACTATED RINGERS IV SOLN: INTRAVENOUS | Status: DC

## 2020-01-25 MED ORDER — FENTANYL CITRATE (PF) 250 MCG/5ML IJ SOLN
INTRAMUSCULAR | Status: AC
  Filled 2020-01-25: qty 5

## 2020-01-25 MED ORDER — PROPOFOL 10 MG/ML IV BOLUS
INTRAVENOUS | Status: DC | PRN
  Administered 2020-01-25: 180 mg via INTRAVENOUS

## 2020-01-25 MED ORDER — ACETAMINOPHEN 160 MG/5ML PO SOLN: 325.0000 mg | ORAL | Status: DC | PRN

## 2020-01-25 MED ORDER — FENTANYL CITRATE (PF) 100 MCG/2ML IJ SOLN: 25.0000 ug | INTRAMUSCULAR | Status: DC | PRN

## 2020-01-25 MED ORDER — CHLORHEXIDINE GLUCONATE 0.12 % MT SOLN
OROMUCOSAL | Status: AC
  Administered 2020-01-25: 15 mL
  Filled 2020-01-25: qty 15

## 2020-01-25 MED ORDER — DOCUSATE SODIUM 100 MG PO CAPS
100.0000 mg | ORAL_CAPSULE | Freq: Two times a day (BID) | ORAL | Status: DC
  Administered 2020-01-25 – 2020-02-05 (×19): 100 mg via ORAL
  Filled 2020-01-25 (×25): qty 1

## 2020-01-25 MED ORDER — HYDROMORPHONE HCL 1 MG/ML IJ SOLN
0.5000 mg | INTRAMUSCULAR | Status: DC | PRN
  Administered 2020-01-25 – 2020-01-26 (×3): 0.5 mg via INTRAVENOUS
  Filled 2020-01-25 (×3): qty 0.5

## 2020-01-25 MED ORDER — ROCURONIUM BROMIDE 10 MG/ML (PF) SYRINGE
PREFILLED_SYRINGE | INTRAVENOUS | Status: AC
  Filled 2020-01-25: qty 10

## 2020-01-25 MED ORDER — METOCLOPRAMIDE HCL 5 MG/ML IJ SOLN: 5.0000 mg | Freq: Three times a day (TID) | INTRAMUSCULAR | Status: DC | PRN

## 2020-01-25 MED ORDER — PHENYLEPHRINE 40 MCG/ML (10ML) SYRINGE FOR IV PUSH (FOR BLOOD PRESSURE SUPPORT)
PREFILLED_SYRINGE | INTRAVENOUS | Status: DC | PRN
  Administered 2020-01-25: 80 ug via INTRAVENOUS

## 2020-01-25 MED ORDER — ONDANSETRON HCL 4 MG/2ML IJ SOLN: 4.0000 mg | Freq: Once | INTRAMUSCULAR | Status: DC | PRN

## 2020-01-25 MED ORDER — MIDAZOLAM HCL 2 MG/2ML IJ SOLN
INTRAMUSCULAR | Status: AC
  Administered 2020-01-25: 2 mg via INTRAVENOUS
  Filled 2020-01-25: qty 2

## 2020-01-25 MED ORDER — PHENYLEPHRINE 40 MCG/ML (10ML) SYRINGE FOR IV PUSH (FOR BLOOD PRESSURE SUPPORT)
PREFILLED_SYRINGE | INTRAVENOUS | Status: AC
  Filled 2020-01-25: qty 10

## 2020-01-25 SURGICAL SUPPLY — 35 items
BLADE SAW RECIP 87.9 MT (BLADE) ×2 IMPLANT
BLADE SURG 21 STRL SS (BLADE) ×2 IMPLANT
BNDG COHESIVE 6X5 TAN STRL LF (GAUZE/BANDAGES/DRESSINGS) ×2 IMPLANT
CANISTER WOUND CARE 500ML ATS (WOUND CARE) ×2 IMPLANT
COVER SURGICAL LIGHT HANDLE (MISCELLANEOUS) ×2 IMPLANT
CUFF TOURN SGL QUICK 34 (TOURNIQUET CUFF) ×1
CUFF TRNQT CYL 34X4.125X (TOURNIQUET CUFF) ×1 IMPLANT
DRAPE INCISE IOBAN 66X45 STRL (DRAPES) ×2 IMPLANT
DRAPE U-SHAPE 47X51 STRL (DRAPES) ×2 IMPLANT
DRESSING PREVENA PLUS CUSTOM (GAUZE/BANDAGES/DRESSINGS) ×2 IMPLANT
DRSG PREVENA PLUS CUSTOM (GAUZE/BANDAGES/DRESSINGS) ×4
DURAPREP 26ML APPLICATOR (WOUND CARE) ×2 IMPLANT
ELECT REM PT RETURN 9FT ADLT (ELECTROSURGICAL) ×2
ELECTRODE REM PT RTRN 9FT ADLT (ELECTROSURGICAL) ×1 IMPLANT
GLOVE BIOGEL PI IND STRL 9 (GLOVE) ×1 IMPLANT
GLOVE BIOGEL PI INDICATOR 9 (GLOVE) ×1
GLOVE SURG ORTHO 9.0 STRL STRW (GLOVE) ×2 IMPLANT
GOWN STRL REUS W/ TWL XL LVL3 (GOWN DISPOSABLE) ×2 IMPLANT
GOWN STRL REUS W/TWL XL LVL3 (GOWN DISPOSABLE) ×2
KIT BASIN OR (CUSTOM PROCEDURE TRAY) ×2 IMPLANT
KIT PUMP PREVENA PLUS 14DAY (MISCELLANEOUS) ×2 IMPLANT
KIT TURNOVER KIT B (KITS) ×2 IMPLANT
MANIFOLD NEPTUNE II (INSTRUMENTS) ×2 IMPLANT
NS IRRIG 1000ML POUR BTL (IV SOLUTION) ×2 IMPLANT
PACK ORTHO EXTREMITY (CUSTOM PROCEDURE TRAY) ×2 IMPLANT
PAD ARMBOARD 7.5X6 YLW CONV (MISCELLANEOUS) ×2 IMPLANT
PREVENA RESTOR ARTHOFORM 46X30 (CANNISTER) ×2 IMPLANT
STAPLER VISISTAT 35W (STAPLE) ×2 IMPLANT
STOCKINETTE IMPERVIOUS LG (DRAPES) ×2 IMPLANT
SUT SILK 2 0 (SUTURE) ×1
SUT SILK 2-0 18XBRD TIE 12 (SUTURE) ×1 IMPLANT
SUT VIC AB 1 CTX 27 (SUTURE) ×4 IMPLANT
TOWEL GREEN STERILE (TOWEL DISPOSABLE) ×2 IMPLANT
TUBE CONNECTING 12X1/4 (SUCTIONS) ×2 IMPLANT
YANKAUER SUCT BULB TIP NO VENT (SUCTIONS) ×2 IMPLANT

## 2020-01-25 NOTE — Anesthesia Postprocedure Evaluation (Signed)
Anesthesia Post Note  Patient: Nicole [name]  Procedure(s) Performed: LEFT BELOW KNEE AMPUTATION (Left Knee)     Patient location during evaluation: PACU Anesthesia Type: General Level of consciousness: awake and alert Pain management: pain level controlled Vital Signs Assessment: post-procedure vital signs reviewed and stable Respiratory status: spontaneous breathing, nonlabored ventilation, respiratory function stable and patient connected to nasal cannula oxygen Cardiovascular status: blood pressure returned to baseline and stable Postop Assessment: no apparent nausea or vomiting Anesthetic complications: no   No complications documented.  Last Vitals:  Vitals:   01/25/20 1026 01/25/20 1030  BP: 113/73 130/61  Pulse: (!) 105 (!) 103  Resp: 18 19  Temp: 36.7 C   SpO2: 97% 96%    Last Pain:  Vitals:   01/25/20 1026  TempSrc:   PainSc: Asleep                 Halena Mohar

## 2020-01-25 NOTE — Anesthesia Procedure Notes (Signed)
Anesthesia Regional Block: Popliteal block   Pre-Anesthetic Checklist: ,, timeout performed, Correct Patient, Correct Site, Correct Laterality, Correct Procedure, Correct Position, site marked, Risks and benefits discussed,  Surgical consent,  Pre-op evaluation,  At surgeon's request and post-op pain management  Laterality: Left  Prep: chloraprep       Needles:  Injection technique: Single-shot  Needle Type: Echogenic Stimulator Needle     Needle Length: 5cm  Needle Gauge: 22     Additional Needles:   Procedures:, nerve stimulator,,, ultrasound used (permanent image in chart),,,,   Nerve Stimulator or Paresthesia:  Response: foot, 0.45 mA,   Additional Responses:   Narrative:  Start time: 01/25/2020 9:35 AM End time: 01/25/2020 9:43 AM Injection made incrementally with aspirations every 5 mL.  Performed by: Personally  Anesthesiologist: Bethena Midget, MD  Additional Notes: Functioning IV was confirmed and monitors were applied.  A 55mm 22ga Arrow echogenic stimulator needle was used. Sterile prep and drape,hand hygiene and sterile gloves were used. Ultrasound guidance: relevant anatomy identified, needle position confirmed, local anesthetic spread visualized around nerve(s)., vascular puncture avoided.  Image printed for medical record. Negative aspiration and negative test dose prior to incremental administration of local anesthetic. The patient tolerated the procedure well.

## 2020-01-25 NOTE — Addendum Note (Signed)
Addendum  created 01/25/20 1140 by Makai Agostinelli A, CRNA   Charge Capture section accepted    

## 2020-01-25 NOTE — Progress Notes (Signed)
PROGRESS NOTE    ARMONI KLUDT  YJE:[id number] DOB: 06-17-1965 DOA: 01/22/2020 PCP: [name], MD    Brief Narrative:  Mrs. Valladares was admitted to the hospital with a working diagnosis of left foot cellulitis, infected diabetic foot ulcer/osteomyelitis complicated by sepsis, present on admission.  54 year old female with past medical history for COPD, dyslipidemia, hypertension, obesity class III, type 2 diabetes mellitus, coronary artery disease, diastolic heart failure, atrial fibrillation, and obstructive sleep apnea who presents with left foot pain.  She had a recent hospitalization 10/13-10/18 for left diabetic foot infection/cellulitis, received antibiotic therapy, and was discharged home with home health services.  On the day of admission the wound was evaluated by home health, it was noted to be significantly worse, and was referred to the hospital.  She had persistent and worsening left foot pain.  On her initial physical examination her temperature was 98.2, heart rate 111, blood pressure 133/58, respiratory rate 28, oxygen saturation 95%, her lungs are clear to auscultation bilaterally, heart S1-S2, present rhythmic, soft abdomen, no lower extremity edema.  Her left foot had positive edema, positive erythema and ulcerative lesions on the plantar surface. Sodium 135, potassium 3.2, chloride 94, bicarb 29, glucose 371, white count 19.2, hemoglobin 11.0, hematocrit 35.4, platelets 339.  SARS COVID-19 negative.  Urinalysis more than 300 protein, specific gravity 1.024, 0-5 white cells.  Left foot radiograph with extensive soft tissue edema and subcutaneous emphysema, throughout the left foot compatible with infection.  Cortical irregularity lateral margin of the distal tuft first distal phalanx consistent with osteomyelitis.  Patient was placed on broad-spectrum antibiotic therapy, orthopedics was consulted, and patient has been transferred to [facility] from [facility]  for further surgical orthopedic intervention.  Patient underwent left transtibial amputation on 12/03.    Assessment & Plan:   Principal Problem:   Sepsis due to Lt foot osteo Active Problems:   Coronary atherosclerosis of native coronary artery/Stent in 2006   Diabetes mellitus type 2 in obese (HCC)   HTN (hypertension), benign   COPD (chronic obstructive pulmonary disease) (HCC)   Diabetic ulcer of left foot (HCC)   Cellulitis of left foot   Osteomyelitis of left foot (HCC)   Leukocytosis   Hypokalemia   Hypoalbuminemia   Hyperglycemia due to diabetes mellitus (HCC)   Morbid obesity with body mass index (BMI) of 50.0 to 59.9 in adult [facility])   Tobacco abuse--2 P/day Smoker   Acute osteomyelitis of toe, left (HCC)    1. Left foot osteomyelitis, diabetic foot infection, complicated with sepsis present on admission.  Today patient underwent left transtibial amputation. Wbc is 17,5, cultures no growth, PCR screen positive for MRSA  Plan to continue with vancomycin and zosyn. Pain control with oxycodone, acetaminophen and hydromorphone.  Follow with further recommendations from surgery and physical/occupational therapy.  Check cell count in am.    2. HTN/ CAD. On metoprolol and lisinopril for blood pressure control. Today 127/68 mmHg.  3. Uncontrolled T2DM/ dyslipidemia. Fasting glucose this am 392, capillary  306 and 236.   Basal insulin this am was hold due to NPO status for surgical intervention, plan to resume today, continue with sliding scale insulin for glucose cover and monitoring.    On atorvastatin.   4. Hypokalemia, hypomagnesemia, metabolic alkalosis. Renal function with serum cr at 0.89 with K at 4,3 and serum bicarbonate at 28,.  Continue close follow up on renal function and electrolytes, now on post operative IV fluids.   5. Obesity class  3. BMI 51.3. plan for outpatient follow up.   6. Chronic atrial fibrillation. Rate control with metoprolol  with rate 83 bpm at rest. Continue to hold on antithrombotic therapy for now, until safe per surgery.   7. COPD/ tobacco abuse. No signs of exacerbation. Continue with dulera.  On nicotine patch.  8. Depression. On duloxetine.     Patient continue to be at high risk for worsening sepsis   Status is: Inpatient  Remains inpatient appropriate because:IV treatments appropriate due to intensity of illness or inability to take PO   Dispo: The patient is from: Home              Anticipated d/c is to: Home              Anticipated d/c date is: 3 days              Patient currently is not medically stable to d/c.  DVT prophylaxis: scd  / heparin sq Code Status:   full  Family Communication:  No family at the bedside      Nutrition Status:           Skin Documentation:     Consultants:   Orthopedics   Procedures:   Left transtibial amputation   Antimicrobials:   Vancomycin and Zosyn     Subjective: Patient with post-operative pain, moderate in intensity, no radiation, worse with movement, no associated nausea or vomiting.   Objective: Vitals:   01/25/20 1130 01/25/20 1145 01/25/20 1200 01/25/20 1225  BP: 125/75 116/77 (!) 112/53 127/68  Pulse: 98 98 95 95  Resp: (!) 22 17 19 17   Temp:   98.3 F (36.8 C) 98 F (36.7 C)  TempSrc:    Oral  SpO2: 95% 96% 94% 100%  Weight:      Height:        Intake/Output Summary (Last 24 hours) at 01/25/2020 1246 Last data filed at 01/25/2020 1028 Gross per 24 hour  Intake 977.9 ml  Output 650 ml  Net 327.9 ml   Filed Weights   01/22/20 1508  Weight: 131.5 kg    Examination:   General: Not in pain or dyspnea. Deconditioned  Neurology: Awake and alert, non focal  E ENT: no pallor, no icterus, oral mucosa moist Cardiovascular: No JVD. S1-S2 present, rhythmic, no gallops, rubs, or murmurs. No lower extremity edema. Pulmonary: positive breath sounds bilaterally, adequate air movement, no wheezing, rhonchi or  rales. Gastrointestinal. Abdomen soft and non tender, protuberant,  Skin. No rashes Musculoskeletal: left transtibial amputation.    Data Reviewed: I have personally reviewed following labs and imaging studies  CBC: Recent Labs  Lab 01/22/20 1701 01/23/20 0421 01/24/20 0522 01/25/20 0638  WBC 19.2* 17.1* 15.4* 17.5*  NEUTROABS 14.1*  --   --  12.2*  HGB 11.0* 11.0* 10.3* 10.2*  HCT 35.4* 34.7* 34.2* 33.4*  MCV 94.7 94.8 98.6 95.4  PLT 339 328 335 346   Basic Metabolic Panel: Recent Labs  Lab 01/22/20 1701 01/23/20 0421 01/24/20 0522 01/25/20 0638  NA 135 139 139 138  K 3.2* 3.6 4.1 4.3  CL 94* 97* 95* 98  CO2 29 32 34* 28  GLUCOSE 371* 131* 266* 392*  BUN 16 15 16 14   CREATININE 0.88 0.74 0.87 0.89  CALCIUM 8.5* 8.6* 8.9 9.1  MG  --  1.6*  --   --   PHOS  --  3.7  --   --    GFR: Estimated Creatinine Clearance:  94.7 mL/min (by C-G formula based on SCr of 0.89 mg/dL). Liver Function Tests: Recent Labs  Lab 01/22/20 1701 01/23/20 0421  AST 37 31  ALT 30 27  ALKPHOS 155* 140*  BILITOT 0.8 0.7  PROT 7.4 6.8  ALBUMIN 2.5* 2.5*   No results for input(s): LIPASE, AMYLASE in the last 168 hours. No results for input(s): AMMONIA in the last 168 hours. Coagulation Profile: Recent Labs  Lab 01/23/20 0421  INR 1.4*   Cardiac Enzymes: No results for input(s): CKTOTAL, CKMB, CKMBINDEX, TROPONINI in the last 168 hours. BNP (last 3 results) No results for input(s): PROBNP in the last 8760 hours. HbA1C: No results for input(s): HGBA1C in the last 72 hours. CBG: Recent Labs  Lab 01/24/20 2050 01/25/20 0105 01/25/20 0453 01/25/20 0818 01/25/20 1032  GLUCAP 326* 364* 366* 306* 236*   Lipid Profile: No results for input(s): CHOL, HDL, LDLCALC, TRIG, CHOLHDL, LDLDIRECT in the last 72 hours. Thyroid Function Tests: No results for input(s): TSH, T4TOTAL, FREET4, T3FREE, THYROIDAB in the last 72 hours. Anemia Panel: No results for input(s): VITAMINB12, FOLATE,  FERRITIN, TIBC, IRON, RETICCTPCT in the last 72 hours.    Radiology Studies: I have reviewed all of the imaging during this hospital visit personally     Scheduled Meds: . atorvastatin  40 mg Oral Daily  . docusate sodium  100 mg Oral BID  . DULoxetine  30 mg Oral BID  . feeding supplement (GLUCERNA SHAKE)  237 mL Oral BID  . [START ON 01/26/2020] heparin injection (subcutaneous)  5,000 Units Subcutaneous Q8H  . insulin aspart  0-15 Units Subcutaneous Q4H  . insulin glargine  10 Units Subcutaneous Daily  . lisinopril  2.5 mg Oral Daily  . metoprolol tartrate  50 mg Oral BID  . mometasone-formoterol  2 puff Inhalation BID  . nicotine  21 mg Transdermal Daily  . pantoprazole  40 mg Oral Daily   Continuous Infusions: . sodium chloride 75 mL/hr at 01/25/20 1241  . lactated ringers 10 mL/hr at 01/25/20 0945  . piperacillin-tazobactam (ZOSYN)  IV 3.375 g (01/25/20 0603)  . vancomycin 1,250 mg (01/24/20 2351)     LOS: 3 days        Margalit Leece Annett Gula, MD

## 2020-01-25 NOTE — Anesthesia Preprocedure Evaluation (Addendum)
Anesthesia Evaluation  Patient identified by MRN, date of birth, ID band Patient awake    Reviewed: Allergy & Precautions, H&P , NPO status , Patient's Chart, lab work & pertinent test results, reviewed documented beta blocker date and time   Airway Mallampati: III  TM Distance: >3 FB Neck ROM: full    Dental no notable dental hx. (+) Poor Dentition, Chipped, Missing,    Pulmonary COPD, Current Smoker,    Pulmonary exam normal breath sounds clear to auscultation       Cardiovascular Exercise Tolerance: Good hypertension, Pt. on medications and Pt. on home beta blockers + CAD and +CHF  + dysrhythmias Atrial Fibrillation  Rhythm:regular Rate:Normal     Neuro/Psych TIAnegative psych ROS   GI/Hepatic negative GI ROS, Neg liver ROS,   Endo/Other  diabetes, Type obesity  Renal/GU negative Renal ROS  negative genitourinary   Musculoskeletal   Abdominal   Peds  Hematology negative hematology ROS (+)   Anesthesia Other Findings   Reproductive/Obstetrics negative OB ROS                            Anesthesia Physical Anesthesia Plan  ASA: IV  Anesthesia Plan: General   Post-op Pain Management: GA combined w/ Regional for post-op pain   Induction:   PONV Risk Score and Plan: 3 and Ondansetron, Midazolam and Treatment may vary due to age or medical condition  Airway Management Planned: LMA and Oral ETT  Additional Equipment: None  Intra-op Plan:   Post-operative Plan: Extubation in OR  Informed Consent: I have reviewed the patients History and Physical, chart, labs and discussed the procedure including the risks, benefits and alternatives for the proposed anesthesia with the patient or authorized representative who has indicated his/her understanding and acceptance.     Dental Advisory Given  Plan Discussed with: CRNA and Anesthesiologist  Anesthesia Plan Comments: (Chronic  O2 3LNC)      Anesthesia Quick Evaluation

## 2020-01-25 NOTE — Anesthesia Procedure Notes (Signed)
Procedure Name: LMA Insertion Date/Time: 01/25/2020 9:54 AM Performed by: Waynard Edwards, CRNA Pre-anesthesia Checklist: Patient identified, Emergency Drugs available, Suction available and Patient being monitored Patient Re-evaluated:Patient Re-evaluated prior to induction Oxygen Delivery Method: Circle System Utilized Preoxygenation: Pre-oxygenation with 100% oxygen Induction Type: IV induction LMA: LMA inserted LMA Size: 4.0 Number of attempts: 1 Airway Equipment and Method: Bite block Placement Confirmation: positive ETCO2 Tube secured with: Tape Dental Injury: Teeth and Oropharynx as per pre-operative assessment

## 2020-01-25 NOTE — Progress Notes (Signed)
Inpatient Diabetes Program Recommendations  AACE/ADA: New Consensus Statement on Inpatient Glycemic Control (2015)  Target Ranges:  Prepandial:   less than 140 mg/dL      Peak postprandial:   less than 180 mg/dL (1-2 hours)      Critically ill patients:  140 - 180 mg/dL   Lab Results  Component Value Date   GLUCAP 306 (H) 01/25/2020   HGBA1C 9.6 (H) 12/25/2019    Review of Glycemic Control Results for Nicole [name], Nicole [name] (MRN [id number]) as of 01/25/2020 10:17  Ref. Range 01/24/2020 16:11 01/24/2020 20:50 01/25/2020 01:05 01/25/2020 04:53 01/25/2020 08:18  Glucose-Capillary Latest Ref Range: 70 - 99 mg/dL 867 (H) 619 (H) 509 (H) 366 (H) 306 (H)   Diabetes history:  DM2 Outpatient Diabetes medications:  Levemir 100 units BID Novolog 30 units TID Glipzide 10 BID Trulicity 1.5 weekly Current orders for Inpatient glycemic control:  Novolog 0-15 units Q4H Lantus 10 units daily  Inpatient Diabetes Program Recommendations:     Levemir 40 units BID  Will continue to follow while inpatient.  Thank you, Dulce Sellar, RN, BSN Diabetes Coordinator Inpatient Diabetes Program [phone] (team pager from 8a-5p)

## 2020-01-25 NOTE — Progress Notes (Signed)
Orthopedic Tech Progress Note Patient Details:  Nicole [name] [birth year] [id number] Called in order to [facility] for a VIVE PROTOCOL BK Patient ID: ICA DAYE, female   DOB: Oct 09, 1965, 54 y.o.   MRN: [id number]   Donald Pore 01/25/2020, 12:55 PM

## 2020-01-25 NOTE — Transfer of Care (Signed)
Immediate Anesthesia Transfer of Care Note  Patient: Nicole [name]  Procedure(s) Performed: LEFT BELOW KNEE AMPUTATION (Left Knee)  Patient Location: PACU  Anesthesia Type:GA combined with regional for post-op pain  Level of Consciousness: drowsy  Airway & Oxygen Therapy: Patient Spontanous Breathing and Patient connected to face mask oxygen  Post-op Assessment: Report given to RN and Post -op Vital signs reviewed and stable  Post vital signs: Reviewed and stable  Last Vitals:  Vitals Value Taken Time  BP 113/73 01/25/20 1026  Temp    Pulse 104 01/25/20 1027  Resp 19 01/25/20 1027  SpO2 96 % 01/25/20 1027  Vitals shown include unvalidated device data.  Last Pain:  Vitals:   01/25/20 0758  TempSrc: Oral  PainSc:          Complications: No complications documented.

## 2020-01-25 NOTE — Op Note (Signed)
   Date of Surgery: 01/25/2020  INDICATIONS: Nicole [name] is a 54 y.o.-year-old female who has undergone prolonged conservative therapy for her left foot.  She is hospitalized in October treated with IV antibiotics and subsequently treated at wound center with progressive infection of the left foot.Marland Kitchen  PREOPERATIVE DIAGNOSIS: Osteomyelitis abscess ulceration left foot  POSTOPERATIVE DIAGNOSIS: Same.  PROCEDURE: Transtibial amputation Application of Prevena wound VAC  SURGEON: Lajoyce Corners, M.D.  ANESTHESIA:  general  IV FLUIDS AND URINE: See anesthesia.  ESTIMATED BLOOD LOSS: Minimal mL.  COMPLICATIONS: None.  DESCRIPTION OF PROCEDURE: The patient was brought to the operating room and underwent a general anesthetic. After adequate levels of anesthesia were obtained patient's lower extremity was prepped using DuraPrep draped into a sterile field. A timeout was called. The foot was draped out of the sterile field with impervious stockinette. A transverse incision was made 11 cm distal to the tibial tubercle. This curved proximally and a large posterior flap was created. The tibia was transected 1 cm proximal to the skin incision. The fibula was transected just proximal to the tibial incision. The tibia was beveled anteriorly. A large posterior flap was created. The sciatic nerve was pulled cut and allowed to retract. The vascular bundles were suture ligated with 2-0 silk. The deep and superficial fascial layers were closed using #1 Vicryl. The skin was closed using staples and 2-0 nylon. The wound was covered with a Prevena wound VAC. There was a good suction fit. A prosthetic shrinker was applied. Patient was extubated taken to the PACU in stable condition.   DISCHARGE PLANNING:  Antibiotic duration: 24 hours postoperatively  Weightbearing: Nonweightbearing on the left knee  Pain medication: Opioid pathway  Dressing care/ Wound VAC: Continue wound VAC for 1 week  Discharge to: Skilled  nursing  Follow-up: In the office 1 week post operative.  Nicole Baker, MD [facility] 10:31 AM

## 2020-01-26 ENCOUNTER — Encounter (HOSPITAL_COMMUNITY): Payer: Self-pay | Admitting: Orthopedic Surgery

## 2020-01-26 LAB — CBC WITH DIFFERENTIAL/PLATELET
Abs Immature Granulocytes: 0.5 10*3/uL — ABNORMAL HIGH (ref 0.00–0.07)
Basophils Absolute: 0.1 10*3/uL (ref 0.0–0.1)
Basophils Relative: 1 %
Eosinophils Absolute: 0.4 10*3/uL (ref 0.0–0.5)
Eosinophils Relative: 2 %
HCT: 36.5 % (ref 36.0–46.0)
Hemoglobin: 11 g/dL — ABNORMAL LOW (ref 12.0–15.0)
Immature Granulocytes: 3 %
Lymphocytes Relative: 14 %
Lymphs Abs: 2.7 10*3/uL (ref 0.7–4.0)
MCH: 29.6 pg (ref 26.0–34.0)
MCHC: 30.1 g/dL (ref 30.0–36.0)
MCV: 98.1 fL (ref 80.0–100.0)
Monocytes Absolute: 1.1 10*3/uL — ABNORMAL HIGH (ref 0.1–1.0)
Monocytes Relative: 6 %
Neutro Abs: 14.2 10*3/uL — ABNORMAL HIGH (ref 1.7–7.7)
Neutrophils Relative %: 74 %
Platelets: 356 10*3/uL (ref 150–400)
RBC: 3.72 MIL/uL — ABNORMAL LOW (ref 3.87–5.11)
RDW: 14.6 % (ref 11.5–15.5)
WBC: 19 10*3/uL — ABNORMAL HIGH (ref 4.0–10.5)
nRBC: 0 % (ref 0.0–0.2)

## 2020-01-26 LAB — BASIC METABOLIC PANEL
Anion gap: 15 (ref 5–15)
BUN: 19 mg/dL (ref 6–20)
CO2: 28 mmol/L (ref 22–32)
Calcium: 9.6 mg/dL (ref 8.9–10.3)
Chloride: 100 mmol/L (ref 98–111)
Creatinine, Ser: 0.85 mg/dL (ref 0.44–1.00)
GFR, Estimated: >60 mL/min (ref >60)
Glucose, Bld: 227 mg/dL — ABNORMAL HIGH (ref 70–99)
Potassium: 4.5 mmol/L (ref 3.5–5.1)
Sodium: 143 mmol/L (ref 135–145)

## 2020-01-26 LAB — GLUCOSE, CAPILLARY
Glucose-Capillary: 205 mg/dL — ABNORMAL HIGH (ref 70–99)
Glucose-Capillary: 210 mg/dL — ABNORMAL HIGH (ref 70–99)
Glucose-Capillary: 236 mg/dL — ABNORMAL HIGH (ref 70–99)
Glucose-Capillary: 241 mg/dL — ABNORMAL HIGH (ref 70–99)
Glucose-Capillary: 246 mg/dL — ABNORMAL HIGH (ref 70–99)
Glucose-Capillary: 367 mg/dL — ABNORMAL HIGH (ref 70–99)

## 2020-01-26 MED ORDER — APIXABAN 5 MG PO TABS
5.0000 mg | ORAL_TABLET | Freq: Two times a day (BID) | ORAL | Status: DC
  Administered 2020-01-26 – 2020-02-06 (×23): 5 mg via ORAL
  Filled 2020-01-26 (×23): qty 1

## 2020-01-26 MED ORDER — INSULIN GLARGINE 100 UNIT/ML ~~LOC~~ SOLN
20.0000 [IU] | Freq: Every day | SUBCUTANEOUS | Status: DC
  Administered 2020-01-27 – 2020-01-28 (×2): 20 [IU] via SUBCUTANEOUS
  Filled 2020-01-26 (×2): qty 0.2

## 2020-01-26 MED ORDER — TRAZODONE HCL 50 MG PO TABS
50.0000 mg | ORAL_TABLET | Freq: Every day | ORAL | Status: DC
  Administered 2020-01-26 – 2020-02-05 (×11): 50 mg via ORAL
  Filled 2020-01-26 (×11): qty 1

## 2020-01-26 MED ORDER — HYDROMORPHONE HCL 1 MG/ML IJ SOLN
1.0000 mg | INTRAMUSCULAR | Status: DC | PRN
  Administered 2020-01-27 – 2020-01-29 (×3): 1 mg via INTRAVENOUS
  Filled 2020-01-26 (×4): qty 1

## 2020-01-26 MED ORDER — CELECOXIB 200 MG PO CAPS
200.0000 mg | ORAL_CAPSULE | Freq: Two times a day (BID) | ORAL | Status: DC
  Administered 2020-01-26 – 2020-02-06 (×23): 200 mg via ORAL
  Filled 2020-01-26 (×23): qty 1

## 2020-01-26 MED ORDER — OXYCODONE HCL 5 MG PO TABS
10.0000 mg | ORAL_TABLET | ORAL | Status: DC | PRN
  Administered 2020-01-26 – 2020-02-06 (×18): 10 mg via ORAL
  Filled 2020-01-26 (×19): qty 2

## 2020-01-26 MED ORDER — ACETAMINOPHEN 500 MG PO TABS
500.0000 mg | ORAL_TABLET | Freq: Four times a day (QID) | ORAL | Status: DC
  Administered 2020-01-26 – 2020-02-06 (×36): 500 mg via ORAL
  Filled 2020-01-26 (×39): qty 1

## 2020-01-26 NOTE — Progress Notes (Signed)
PROGRESS NOTE    Nicole [name]  QBH:[id number] DOB: 07-Jul-1965 DOA: 01/22/2020 PCP: [name], MD    Brief Narrative:  Mrs. Nicole [name] admitted to the hospital with a working diagnosis of left foot cellulitis, infected diabetic foot ulcer/osteomyelitis complicated by sepsis, present on admission.  54 year old female with past medical history for COPD, dyslipidemia, hypertension, obesity class III, type 2 diabetes mellitus, coronary artery disease, diastolic heart failure, atrial fibrillation,andobstructive sleep apnea who presents with left foot pain. She had a recent hospitalization 10/13-10/18 for left diabetic foot infection/cellulitis, received antibiotic therapy, and was discharged home with home health services. On the day of admission the wound was evaluated by home health, it was noted to be significantly worse, and was referred to the hospital. She had persistent and worsening left foot pain. On her initial physical examination her temperature was 98.2, heart rate 111, blood pressure 133/58, respiratory rate 28, oxygen saturation 95%, her lungs are clear to auscultation bilaterally, heart S1-S2, present rhythmic, soft abdomen, no lower extremity edema. Her left foot had positive edema, positive erythema and ulcerative lesions on the plantar surface. Sodium 135, potassium 3.2, chloride 94, bicarb 29, glucose 371, white count 19.2, hemoglobin 11.0, hematocrit 35.4, platelets 339. SARS COVID-19 negative. Urinalysis more than 300 protein, specific gravity 1.024, 0-5 white cells.  Left foot radiograph with extensive soft tissue edema and subcutaneous emphysema, throughout the left foot compatible with infection. Cortical irregularity lateral margin of the distal tuft first distal phalanx consistent with osteomyelitis.  Patient was placed on broad-spectrum antibiotic therapy, orthopedics was consulted,andpatient has been transferred to [facility]  for further surgical orthopedic intervention.  Patient underwent left transtibial amputation on 12/03.    Assessment & Plan:   Principal Problem:   Sepsis due to Lt foot osteo Active Problems:   Coronary atherosclerosis of native coronary artery/Stent in 2006   Diabetes mellitus type 2 in obese (HCC)   HTN (hypertension), benign   COPD (chronic obstructive pulmonary disease) (HCC)   Diabetic ulcer of left foot (HCC)   Cellulitis of left foot   Osteomyelitis of left foot (HCC)   Hypokalemia   Hypoalbuminemia   Hyperglycemia due to diabetes mellitus (HCC)   Morbid obesity with body mass index (BMI) of 50.0 to 59.9 in adult [facility])   Tobacco abuse--2 P/day Smoker   Acute osteomyelitis of toe, left (HCC)   1. Left foot osteomyelitis, diabetic foot infection, complicated with sepsis present on admission. sp left transtibial amputation.  Worsening leukocytosis today at 19.0, cultures from blood with no growth. Patient complains of constant pain.   IV antibiotic therapy with vancomycin and zosyn, plan to stop 24 hr after surgery  Will increase hydromorphone to 1 mg as needed for pain q 4 hr, and increase oxycodone to 10 mg as needed for pain sever pain.  Add celebrex bid and continue scheduled acetaminophen. For insomnia will do a trial of trazodone.   2. HTN/ CAD. continue blood pressure control with metoprolol and lisinopril.   3. Uncontrolled T2DM/ dyslipidemia. patient continue with very poor oral intake, her fasting glucose is 227, capillary 250, 236, 245 and 295.   Increase basal insulin to 20 units, patient at home uses 100 units bid of levemir.  Continue sliding scale with aspart moderate sensitivity   On atorvastatin.   4. Hypokalemia, hypomagnesemia, metabolic alkalosis. electrolyte have been corrected, continue to follow up on renal function and electrolytes, patient with very poor oral intake today.  5. Obesity class  3. BMI 51.3. high risk for inpatient  complications. PT and OT   6. Chronic atrial fibrillation. continue metoprolol for rate control, resume antithrombotic therapy with apixaban.   7. COPD/ tobacco abuse. No current signs of exacerbation. On dulera and nicotine patch.  8. Depression/ insomnia. Continue with duloxetine and bupropion.  Add trazodone for sleep at night.   Patient continue to be at high risk for worsening sepsis   Status is: Inpatient  Remains inpatient appropriate because:IV treatments appropriate due to intensity of illness or inability to take PO   Dispo: The patient is from: Home              Anticipated d/c is to: SNF              Anticipated d/c date is: 3 days              Patient currently is not medically stable to d/c.   DVT prophylaxis: apixaban   Code Status:   full  Family Communication:  No family at the bedside      Consultants:   Orthopedics   Procedures:   Transtibial amputation on the lft   Antimicrobials:   Vancomycin and Zosyn     Subjective: Patient continue to have significant pain at the surgical site, severe in intensity, constant, no improving factors, associated with poor sleep and poor appetite.   Objective: Vitals:   01/26/20 0055 01/26/20 0438 01/26/20 0855 01/26/20 0911  BP:  (!) 152/74  (!) 146/94  Pulse: (!) 107 (!) 102  76  Resp: 18 17  18   Temp:  (!) 97.4 F (36.3 C)  97.9 F (36.6 C)  TempSrc:  Oral    SpO2: 93% 97% 93% 95%  Weight:      Height:        Intake/Output Summary (Last 24 hours) at 01/26/2020 1132 Last data filed at 01/26/2020 0308 Gross per 24 hour  Intake 2581.41 ml  Output --  Net 2581.41 ml   Filed Weights   01/22/20 1508  Weight: 131.5 kg    Examination:   General: Not in pain or dyspnea, deconditioned  Neurology: Awake and alert, non focal  E ENT: mild pallor, no icterus, oral mucosa moist Cardiovascular: No JVD. S1-S2 present, rhythmic, no gallops, rubs, or murmurs. No lower extremity edema. Pulmonary:  vesicular breath sounds bilaterally, adequate air movement, no wheezing, rhonchi or rales. Gastrointestinal. Abdomen protuberant. Non tender  Skin. No rashes Musculoskeletal: left transtibial amputation      Data Reviewed: I have personally reviewed following labs and imaging studies  CBC: Recent Labs  Lab 01/22/20 1701 01/23/20 0421 01/24/20 0522 01/25/20 0638 01/26/20 0239  WBC 19.2* 17.1* 15.4* 17.5* 19.0*  NEUTROABS 14.1*  --   --  12.2* 14.2*  HGB 11.0* 11.0* 10.3* 10.2* 11.0*  HCT 35.4* 34.7* 34.2* 33.4* 36.5  MCV 94.7 94.8 98.6 95.4 98.1  PLT 339 328 335 346 356   Basic Metabolic Panel: Recent Labs  Lab 01/22/20 1701 01/23/20 0421 01/24/20 0522 01/25/20 0638 01/26/20 0239  NA 135 139 139 138 143  K 3.2* 3.6 4.1 4.3 4.5  CL 94* 97* 95* 98 100  CO2 29 32 34* 28 28  GLUCOSE 371* 131* 266* 392* 227*  BUN 16 15 16 14 19   CREATININE 0.88 0.74 0.87 0.89 0.85  CALCIUM 8.5* 8.6* 8.9 9.1 9.6  MG  --  1.6*  --   --   --   PHOS  --  3.7  --   --   --  GFR: Estimated Creatinine Clearance: 99.2 mL/min (by C-G formula based on SCr of 0.85 mg/dL). Liver Function Tests: Recent Labs  Lab 01/22/20 1701 01/23/20 0421  AST 37 31  ALT 30 27  ALKPHOS 155* 140*  BILITOT 0.8 0.7  PROT 7.4 6.8  ALBUMIN 2.5* 2.5*   No results for input(s): LIPASE, AMYLASE in the last 168 hours. No results for input(s): AMMONIA in the last 168 hours. Coagulation Profile: Recent Labs  Lab 01/23/20 0421  INR 1.4*   Cardiac Enzymes: No results for input(s): CKTOTAL, CKMB, CKMBINDEX, TROPONINI in the last 168 hours. BNP (last 3 results) No results for input(s): PROBNP in the last 8760 hours. HbA1C: No results for input(s): HGBA1C in the last 72 hours. CBG: Recent Labs  Lab 01/25/20 1643 01/25/20 2058 01/26/20 0016 01/26/20 0435 01/26/20 0908  GLUCAP 136* 250* 236* 246* 205*   Lipid Profile: No results for input(s): CHOL, HDL, LDLCALC, TRIG, CHOLHDL, LDLDIRECT in the last 72  hours. Thyroid Function Tests: No results for input(s): TSH, T4TOTAL, FREET4, T3FREE, THYROIDAB in the last 72 hours. Anemia Panel: No results for input(s): VITAMINB12, FOLATE, FERRITIN, TIBC, IRON, RETICCTPCT in the last 72 hours.    Radiology Studies: I have reviewed all of the imaging during this hospital visit personally     Scheduled Meds: . apixaban  5 mg Oral BID  . atorvastatin  40 mg Oral Daily  . buPROPion  100 mg Oral BID  . docusate sodium  100 mg Oral BID  . DULoxetine  30 mg Oral BID  . feeding supplement (GLUCERNA SHAKE)  237 mL Oral BID  . insulin aspart  0-15 Units Subcutaneous Q4H  . insulin glargine  10 Units Subcutaneous Daily  . lisinopril  2.5 mg Oral Daily  . metoprolol tartrate  50 mg Oral BID  . mometasone-formoterol  2 puff Inhalation BID  . nicotine  21 mg Transdermal Daily  . pantoprazole  40 mg Oral Daily   Continuous Infusions: . sodium chloride 75 mL/hr at 01/25/20 1241  . lactated ringers 10 mL/hr at 01/25/20 0945  . piperacillin-tazobactam (ZOSYN)  IV 3.375 g (01/26/20 0646)  . vancomycin 1,250 mg (01/26/20 1031)     LOS: 4 days        Nadra Hritz Annett Gula, MD

## 2020-01-26 NOTE — Evaluation (Signed)
Physical Therapy Evaluation Patient Details Name: Nicole [name] MRN: [id number] DOB: Dec 02, 1965 Today's Date: 01/26/2020   History of Present Illness  Patient is a 54 year old female S/P Left LE BKA on 01/25/2020. PMH DMII, obesity, CKD, COPD, HTN   Clinical Impression  Patient required mod a to the edge of the bed and was unot able to transfer to a chair. She has steps to get into her house and inconsistent assist at home if she did get into the house. At this time she would benefit from rehab at a SNF. Acute therapy will continue to work on transfers with the patient.     Follow Up Recommendations SNF    Equipment Recommendations  Rolling walker with 5" wheels    Recommendations for Other Services Rehab consult     Precautions / Restrictions Precautions Precautions: Fall Restrictions Weight Bearing Restrictions: Yes LLE Weight Bearing: Non weight bearing      Mobility  Bed Mobility Overal bed mobility: Needs Assistance Bed Mobility: Supine to Sit;Sit to Supine     Supine to sit: Mod assist Sit to supine: Mod assist   General bed mobility comments: mod a to slide hips and support eresidual limb in and out of bed    Transfers Overall transfer level: Needs assistance Equipment used: Rolling walker (2 wheeled) Transfers: Sit to/from Stand Sit to Stand: Total assist;+2 safety/equipment;+2 physical assistance         General transfer comment: Patient was unable to transfer to a standing position -  Ambulation/Gait                Stairs            Wheelchair Mobility    Modified Rankin (Stroke Patients Only)       Balance                                             Pertinent Vitals/Pain Pain Assessment: 0-10 Pain Score: 8  Pain Location: left LE  Pain Descriptors / Indicators: Aching Pain Intervention(s): Limited activity within patient's tolerance;Monitored during session;Premedicated before session    Home Living  Family/patient expects to be discharged to:: Private residence Living Arrangements: Children Available Help at Discharge: Family Type of Home: House Home Access: Stairs to enter   Secretary/administrator of Steps: uld no state how many steps  Home Layout: One level   Additional Comments: Daughter works and can not provide 24 hour support     Prior Function Level of Independence: Independent         Comments: Patient reports she was able to ambualte prior to hosptilization without a device. She would not ellaborate on prior level.      Hand Dominance   Dominant Hand: Right    Extremity/Trunk Assessment   Upper Extremity Assessment Upper Extremity Assessment: Defer to OT evaluation    Lower Extremity Assessment Lower Extremity Assessment: LLE deficits/detail LLE: Unable to fully assess due to pain       Communication   Communication: No difficulties  Cognition Arousal/Alertness: Awake/alert Behavior During Therapy: WFL for tasks assessed/performed                                   General Comments: needed some cuing to ansswer questions but followed commands  General Comments General comments (skin integrity, edema, etc.): has protective covering     Exercises     Assessment/Plan    PT Assessment Patient needs continued PT services  PT Problem List Decreased strength;Decreased range of motion;Decreased activity tolerance;Decreased mobility;Decreased cognition;Decreased safety awareness;Decreased knowledge of use of DME       PT Treatment Interventions DME instruction;Gait training;Functional mobility training;Therapeutic activities;Therapeutic exercise;Neuromuscular re-education;Patient/family education    PT Goals (Current goals can be found in the Care Plan section)  Acute Rehab PT Goals Patient Stated Goal: did not state a goal  PT Goal Formulation: With patient Time For Goal Achievement: 02/02/20 Potential to Achieve Goals:  Good    Frequency Min 3X/week   Barriers to discharge Inaccessible home environment;Decreased caregiver support steps into hosue and daughter works     Co-evaluation               AM-PAC PT "6 Clicks" Mobility  Outcome Measure Help needed turning from your back to your side while in a flat bed without using bedrails?: A Lot Help needed moving from lying on your back to sitting on the side of a flat bed without using bedrails?: A Lot Help needed moving to and from a bed to a chair (including a wheelchair)?: Total Help needed standing up from a chair using your arms (e.g., wheelchair or bedside chair)?: Total Help needed to walk in hospital room?: Total Help needed climbing 3-5 steps with a railing? : Total 6 Click Score: 8    End of Session Equipment Utilized During Treatment: Gait belt Activity Tolerance: Patient limited by pain Patient left: in bed;with call bell/phone within reach Nurse Communication: Mobility status PT Visit Diagnosis: Unsteadiness on feet (R26.81);Other abnormalities of gait and mobility (R26.89);Repeated falls (R29.6);Muscle weakness (generalized) (M62.81)    Time: 5188-4166 PT Time Calculation (min) (ACUTE ONLY): 17 min   Charges:   PT Evaluation $PT Eval Moderate Complexity: 1 Mod            Dessie Coma PT DPT  01/26/2020, 2:03 PM

## 2020-01-26 NOTE — Plan of Care (Signed)
  Problem: Education: Goal: Knowledge of General Education information will improve Description: Including pain rating scale, medication(s)/side effects and non-pharmacologic comfort measures Outcome: Progressing   Problem: Clinical Measurements: Goal: Ability to maintain clinical measurements within normal limits will improve Outcome: Progressing   Problem: Activity: Goal: Risk for activity intolerance will decrease Outcome: Progressing   Problem: Nutrition: Goal: Adequate nutrition will be maintained Outcome: Progressing   Problem: Skin Integrity: Goal: Risk for impaired skin integrity will decrease Outcome: Progressing   Problem: Safety: Goal: Ability to remain free from injury will improve Outcome: Progressing   Problem: Pain Managment: Goal: General experience of comfort will improve Outcome: Progressing

## 2020-01-26 NOTE — Progress Notes (Signed)
Patient ID: Nicole [name], female   DOB: [birth year], 54 y.o.   MRN: [id number] Patient's postoperative day 1 status post left transtibial amputation there is no drainage in the wound VAC canister there is 1 check.  Anticipate discharge to skilled nursing facility no infection at the amputation site however the muscles were not healthy.  No additional antibiotics required 24 hours after surgery.

## 2020-01-26 NOTE — Plan of Care (Signed)
  Problem: Safety: Goal: Ability to remain free from injury will improve Outcome: Progressing   

## 2020-01-27 LAB — CBC WITH DIFFERENTIAL/PLATELET
Abs Immature Granulocytes: 0.33 10*3/uL — ABNORMAL HIGH (ref 0.00–0.07)
Basophils Absolute: 0.1 10*3/uL (ref 0.0–0.1)
Basophils Relative: 1 %
Eosinophils Absolute: 0.4 10*3/uL (ref 0.0–0.5)
Eosinophils Relative: 2 %
HCT: 33.3 % — ABNORMAL LOW (ref 36.0–46.0)
Hemoglobin: 10.4 g/dL — ABNORMAL LOW (ref 12.0–15.0)
Immature Granulocytes: 2 %
Lymphocytes Relative: 17 %
Lymphs Abs: 2.6 10*3/uL (ref 0.7–4.0)
MCH: 30.1 pg (ref 26.0–34.0)
MCHC: 31.2 g/dL (ref 30.0–36.0)
MCV: 96.5 fL (ref 80.0–100.0)
Monocytes Absolute: 0.9 10*3/uL (ref 0.1–1.0)
Monocytes Relative: 6 %
Neutro Abs: 11 10*3/uL — ABNORMAL HIGH (ref 1.7–7.7)
Neutrophils Relative %: 72 %
Platelets: 363 10*3/uL (ref 150–400)
RBC: 3.45 MIL/uL — ABNORMAL LOW (ref 3.87–5.11)
RDW: 14.6 % (ref 11.5–15.5)
WBC: 15.2 10*3/uL — ABNORMAL HIGH (ref 4.0–10.5)
nRBC: 0 % (ref 0.0–0.2)

## 2020-01-27 LAB — GLUCOSE, CAPILLARY
Glucose-Capillary: 163 mg/dL — ABNORMAL HIGH (ref 70–99)
Glucose-Capillary: 181 mg/dL — ABNORMAL HIGH (ref 70–99)
Glucose-Capillary: 212 mg/dL — ABNORMAL HIGH (ref 70–99)
Glucose-Capillary: 294 mg/dL — ABNORMAL HIGH (ref 70–99)
Glucose-Capillary: 301 mg/dL — ABNORMAL HIGH (ref 70–99)
Glucose-Capillary: 98 mg/dL (ref 70–99)

## 2020-01-27 LAB — CULTURE, BLOOD (ROUTINE X 2)
Culture: NO GROWTH
Culture: NO GROWTH
Special Requests: ADEQUATE
Special Requests: ADEQUATE

## 2020-01-27 MED ORDER — SODIUM CHLORIDE 0.9 % IV SOLN
2.0000 g | INTRAVENOUS | Status: DC
  Administered 2020-01-27 – 2020-01-29 (×3): 2 g via INTRAVENOUS
  Filled 2020-01-27 (×3): qty 20

## 2020-01-27 MED ORDER — INSULIN ASPART 100 UNIT/ML ~~LOC~~ SOLN
5.0000 [IU] | Freq: Three times a day (TID) | SUBCUTANEOUS | Status: DC
  Administered 2020-01-27 – 2020-01-30 (×9): 5 [IU] via SUBCUTANEOUS

## 2020-01-27 MED ORDER — VANCOMYCIN HCL IN DEXTROSE 1-5 GM/200ML-% IV SOLN
1000.0000 mg | Freq: Two times a day (BID) | INTRAVENOUS | Status: DC
  Administered 2020-01-27 – 2020-01-29 (×6): 1000 mg via INTRAVENOUS
  Filled 2020-01-27 (×7): qty 200

## 2020-01-27 NOTE — Evaluation (Addendum)
Occupational Therapy Evaluation Patient Details Name: Nicole [name] MRN: [id number] DOB: 1965/06/11 Today's Date: 01/27/2020    History of Present Illness Patient is a 55 year old female S/P Left LE BKA on 01/25/2020. PMH DMII, obesity, CKD, COPD, HTN    Clinical Impression   Pt PTA:pt living with daughter at home, independent. Pt currently Pt limited by decreased strength, decreased balance, increased pain in LLE and decreased ability to care for self. Pt motivated to return to PLOF. Pt supervisionA to minguardA for bed mobility; pt scooting self towards HOB. Pt did not attempt transfer as RLE very weak.  Pt set-upA to maxA for ADL. Pt would greatly benefit from continued OT skilled services for ADL and mobility. OT following acutely.      Follow Up Recommendations  SNF    Equipment Recommendations  3 in 1 bedside commode    Recommendations for Other Services       Precautions / Restrictions Precautions Precautions: Fall Restrictions Weight Bearing Restrictions: Yes LLE Weight Bearing: Non weight bearing      Mobility Bed Mobility Overal bed mobility: Needs Assistance Bed Mobility: Supine to Sit;Sit to Supine     Supine to sit: Min guard;HOB elevated (use of rail) Sit to supine: Supervision   General bed mobility comments: heavy use of rail; able to scoot towards [facility]    Transfers                 General transfer comment: DNT. No +2 assist available.pt reported to have transferred yesterday and was too weak with +2 assist. Pt trialing using RLE for                                                                                                                                                                                                    Balance Overall balance assessment: Needs assistance   Sitting balance-Leahy Scale: Good Sitting balance - Comments: use of UB                                   ADL either performed or assessed with  clinical judgement   ADL Overall ADL's : Needs assistance/impaired Eating/Feeding: Modified independent;Sitting   Grooming: Set up;Sitting   Upper Body Bathing: Set up;Sitting   Lower Body Bathing: Maximal assistance;Sitting/lateral leans   Upper Body Dressing : Set up;Sitting   Lower Body Dressing: Maximal assistance;Cueing for safety;Sitting/lateral leans;Bed level   Toilet Transfer: Maximal assistance;+2 for physical assistance;+2 for safety/equipment;Comfort height toilet   Toileting- Clothing Manipulation  and Hygiene: Maximal assistance;Sitting/lateral lean;Bed level Toileting - Clothing Manipulation Details (indicate cue type and reason): using pure wick; unable to stand     Functional mobility during ADLs: Min guard (bed mobility only; pt unable to stand at this time unless +2) General ADL Comments: Pt limited by decreased strength, decreased balance, increased pain in LLE and decreased ability to care for self. Pt motivated to return to PLOF.     Vision Baseline Vision/History: No visual deficits Patient Visual Report: No change from baseline Vision Assessment?: No apparent visual deficits     Perception     Praxis      Pertinent Vitals/Pain Pain Assessment: 0-10 Pain Score: 4  Pain Location: left LE  Pain Descriptors / Indicators: Discomfort;Sore Pain Intervention(s): Monitored during session;Repositioned     Hand Dominance Right   Extremity/Trunk Assessment Upper Extremity Assessment Upper Extremity Assessment: Generalized weakness   Lower Extremity Assessment Lower Extremity Assessment: LLE deficits/detail LLE Deficits / Details: s/p LBKA   Cervical / Trunk Assessment Cervical / Trunk Assessment: Other exceptions Cervical / Trunk Exceptions: large body habitus   Communication Communication Communication: No difficulties   Cognition Arousal/Alertness: Awake/alert Behavior During Therapy: WFL for tasks assessed/performed                                    General Comments: increased time to answer questions. could be due to drowsiness   General Comments  shrinker on    Exercises     Shoulder Instructions      Home Living Family/patient expects to be discharged to:: Private residence Living Arrangements: Children (daughter) Available Help at Discharge: Family Type of Home: House Home Access: Stairs to enter Secretary/administrator of Steps: unsure   Home Layout: One level     Bathroom Shower/Tub: Chief Strategy Officer: Standard         Additional Comments: Daughter works and can not provide 24 hour support       Prior Functioning/Environment Level of Independence: Independent        Comments: Pt reports that she was independent prior        OT Problem List: Decreased strength;Decreased activity tolerance;Impaired balance (sitting and/or standing);Decreased safety awareness;Decreased knowledge of use of DME or AE;Impaired UE functional use;Pain;Increased edema      OT Treatment/Interventions: Therapeutic exercise;Self-care/ADL training;Energy conservation;DME and/or AE instruction;Therapeutic activities;Patient/family education;Balance training    OT Goals(Current goals can be found in the care plan section) Acute Rehab OT Goals Patient Stated Goal: pt wants to go to rehab OT Goal Formulation: With patient Time For Goal Achievement: 02/10/20 Potential to Achieve Goals: Good ADL Goals Pt Will Perform Lower Body Dressing: with min assist;sitting/lateral leans;sit to/from stand Pt Will Transfer to Toilet: with mod assist;squat pivot transfer;bedside commode Pt Will Perform Toileting - Clothing Manipulation and hygiene: with mod assist;sitting/lateral leans;sit to/from stand Pt/caregiver will Perform Home Exercise Program: Increased strength;Both right and left upper extremity;With Supervision Additional ADL Goal #1: Pt will increase sitting tolerance x10 mins for ADL tasks with  supervisionA.  OT Frequency: Min 2X/week   Barriers to D/C:            Co-evaluation              AM-PAC OT "6 Clicks" Daily Activity     Outcome Measure Help from another person eating meals?: None Help from another person taking care of personal grooming?: A Little Help  from another person toileting, which includes using toliet, bedpan, or urinal?: A Lot Help from another person bathing (including washing, rinsing, drying)?: A Lot Help from another person to put on and taking off regular upper body clothing?: A Little Help from another person to put on and taking off regular lower body clothing?: A Lot 6 Click Score: 16   End of Session Equipment Utilized During Treatment: Oxygen Nurse Communication: Mobility status  Activity Tolerance: Patient tolerated treatment well Patient left: in bed;with call bell/phone within reach;with bed alarm set  OT Visit Diagnosis: Unsteadiness on feet (R26.81);Muscle weakness (generalized) (M62.81);Pain Pain - Right/Left: Left Pain - part of body: Leg                Time: 4332-9518 OT Time Calculation (min): 41 min Charges:  OT General Charges $OT Visit: 1 Visit OT Evaluation $OT Eval Moderate Complexity: 1 Mod OT Treatments $Self Care/Home Management : 8-22 mins $Therapeutic Activity: 8-22 mins  Flora Lipps, OTR/L Acute Rehabilitation Services Pager: [phone] Office: [phone]   Hulen Mandler C 01/27/2020, 2:08 PM

## 2020-01-27 NOTE — Consult Note (Signed)
Responded to consult, pt unavailable, staff will page again if further chaplain services are needed later if family arrives.  Rev. Donnel Saxon Chaplain

## 2020-01-27 NOTE — Plan of Care (Signed)
  Problem: Pain Managment: Goal: General experience of comfort will improve Outcome: Progressing   Problem: Safety: Goal: Ability to remain free from injury will improve Outcome: Progressing   Problem: Skin Integrity: Goal: Risk for impaired skin integrity will decrease Outcome: Progressing   

## 2020-01-27 NOTE — Plan of Care (Signed)
  Problem: Education: Goal: Knowledge of General Education information will improve Description: Including pain rating scale, medication(s)/side effects and non-pharmacologic comfort measures Outcome: Progressing   Problem: Health Behavior/Discharge Planning: Goal: Ability to manage health-related needs will improve 01/27/2020 0425 by Charlotte Sanes, RN Outcome: Progressing 01/27/2020 0301 by Charlotte Sanes, RN Outcome: Progressing   Problem: Clinical Measurements: Goal: Ability to maintain clinical measurements within normal limits will improve Outcome: Progressing   Problem: Activity: Goal: Risk for activity intolerance will decrease Outcome: Progressing   Problem: Coping: Goal: Level of anxiety will decrease Outcome: Progressing   Problem: Pain Managment: Goal: General experience of comfort will improve 01/27/2020 0425 by Charlotte Sanes, RN Outcome: Progressing 01/27/2020 0301 by Charlotte Sanes, RN Outcome: Progressing   Problem: Skin Integrity: Goal: Risk for impaired skin integrity will decrease 01/27/2020 0425 by Charlotte Sanes, RN Outcome: Progressing 01/27/2020 0301 by Charlotte Sanes, RN Outcome: Progressing   Problem: Safety: Goal: Ability to remain free from injury will improve 01/27/2020 0425 by Charlotte Sanes, RN Outcome: Progressing 01/27/2020 0301 by Charlotte Sanes, RN Outcome: Progressing

## 2020-01-27 NOTE — Progress Notes (Signed)
PROGRESS NOTE    Nicole [name]  HMC:[id number] DOB: 19-Oct-1965 DOA: 01/22/2020 PCP: [name], MD    Brief Narrative:  [name] admitted to the hospital with a working diagnosis of left foot cellulitis, infected diabetic foot ulcer/osteomyelitis complicated by sepsis, present on admission.  54 year old female with past medical history for COPD, dyslipidemia, hypertension, obesity class III, type 2 diabetes mellitus, coronary artery disease, diastolic heart failure, atrial fibrillation,andobstructive sleep apnea who presents with left foot pain. She had a recent hospitalization 10/13-10/18 for left diabetic foot infection/cellulitis, received antibiotic therapy, and was discharged home with home health services. On the day of admission the wound was evaluated by home health, it was noted to be significantly worse, and was referred to the hospital. She had persistent and worsening left foot pain. On her initial physical examination her temperature was 98.2, heart rate 111, blood pressure 133/58, respiratory rate 28, oxygen saturation 95%, her lungs are clear to auscultation bilaterally, heart S1-S2, present rhythmic, soft abdomen, no lower extremity edema. Her left foot had positive edema, positive erythema and ulcerative lesions on the plantar surface. Sodium 135, potassium 3.2, chloride 94, bicarb 29, glucose 371, white count 19.2, hemoglobin 11.0, hematocrit 35.4, platelets 339. SARS COVID-19 negative. Urinalysis more than 300 protein, specific gravity 1.024, 0-5 white cells.  Left foot radiograph with extensive soft tissue edema and subcutaneous emphysema, throughout the left foot compatible with infection. Cortical irregularity lateral margin of the distal tuft first distal phalanx consistent with osteomyelitis.  Patient was placed on broad-spectrum antibiotic therapy, orthopedics was consulted,andpatient has been [facility]  for further surgical orthopedic intervention.  Patient underwent left transtibial amputation on 12/03.  She continue to be very weak and deconditioned, plan to transfer to SNF.   Assessment & Plan:   Principal Problem:   Sepsis due to Lt foot osteo Active Problems:   Coronary atherosclerosis of native coronary artery/Stent in 2006   Diabetes mellitus type 2 in obese (HCC)   HTN (hypertension), benign   COPD (chronic obstructive pulmonary disease) (HCC)   Diabetic ulcer of left foot (HCC)   Cellulitis of left foot   Osteomyelitis of left foot (HCC)   Hypokalemia   Hypoalbuminemia   Hyperglycemia due to diabetes mellitus (HCC)   Morbid obesity with body mass index (BMI) of 50.0 to 59.9 in adult [facility])   Tobacco abuse--2 P/day Smoker   Acute osteomyelitis of toe, left (HCC)   1. Left foot osteomyelitis, diabetic foot infection, complicated with sepsis present on admission. sp left transtibial amputation. Her pain is better controlled, wbc is down to 15,2 and cultures remain with no growth.   Continue with IV antibiotic therapy withvancomycin and ceftriaxone for now, per surgery recommendations.  Continue pain control with hydromorphone, oxycodone, celebrex bid and acetaminophen. Tolerating well trial of trazodone qhs.   2. HTN/ CAD.Onmetoprolol and lisinopril for blood pressure control.    3. Uncontrolled T2DM/ dyslipidemia. Capillary glucose 181, 98, 163 and 301.  Continue with basal insulin to 20 units, and will add 5 units of meal coverage of insulin aspart for better glucose control.  Insulin sliding scale moderate sensitivity for glucose monitoring and coverage.   Continue withatorvastatin.   4. Hypokalemia, hypomagnesemia, metabolic alkalosis.improved po intake, follow up on renal function in am.  5. Obesity class 3/ OSA. BMI51.3. high risk for inpatient complications.  Continue with PT and OT, patient will need SNF for further recovery.   Continue  with Cpap at  night and when napping.   6. Chronic atrial fibrillation.rate control with metoprolol and anticoagulation with apixaban.  7. COPD/ tobacco abuse. Continue with dulera and nicotine patch. No signs of acute exacerbation.   8. Depression/ insomnia.On duloxetine and bupropion.  Tolerating well trazodone for sleep at night.  Status is: Inpatient  Remains inpatient appropriate because:IV treatments appropriate due to intensity of illness or inability to take PO   Dispo: The patient is from: Home              Anticipated d/c is to: SNF              Anticipated d/c date is: 2 days              Patient currently is not medically stable to d/c.    DVT prophylaxis: apixaban   Code Status:   full  Family Communication:  No family at the bedside      Consultants:   Orthopedics   Procedures:   Transtibial amputation on the lft   Antimicrobials:   Vancomycin and Zosyn     Subjective: Patient with improved pain at the surgical site, improved appetite, but continue to be very weak and deconditioned. Improved sleep last night.   Objective: Vitals:   01/27/20 0300 01/27/20 0803 01/27/20 0815 01/27/20 1235  BP: 138/88  (!) 158/86 (!) 134/54  Pulse: 99  (!) 106 100  Resp: 20  19 18   Temp: 99.3 F (37.4 C)  98.2 F (36.8 C) 98 F (36.7 C)  TempSrc: Oral  Oral   SpO2: 95% 96% 95% 96%  Weight:      Height:        Intake/Output Summary (Last 24 hours) at 01/27/2020 1324 Last data filed at 01/26/2020 2100 Gross per 24 hour  Intake 120 ml  Output 800 ml  Net -680 ml   Filed Weights   01/22/20 1508  Weight: 131.5 kg    Examination:   General: Not in pain or dyspnea, deconditioned  Neurology: somnolent but easy to arouse, non focal.  E ENT: mild pallor, no icterus, oral mucosa moist Cardiovascular: No JVD. S1-S2 present, rhythmic, no gallops, rubs, or murmurs. No lower extremity edema. Pulmonary: positive breath sounds bilaterally, adequate air  movement, no wheezing, rhonchi or rales. Gastrointestinal. Abdomen soft and non tender Skin. No rashes Musculoskeletal: left BKA     Data Reviewed: I have personally reviewed following labs and imaging studies  CBC: Recent Labs  Lab 01/22/20 1701 01/22/20 1701 01/23/20 0421 01/24/20 0522 01/25/20 14/03/21 01/26/20 0239 01/27/20 0244  WBC 19.2*   < > 17.1* 15.4* 17.5* 19.0* 15.2*  NEUTROABS 14.1*  --   --   --  12.2* 14.2* 11.0*  HGB 11.0*   < > 11.0* 10.3* 10.2* 11.0* 10.4*  HCT 35.4*   < > 34.7* 34.2* 33.4* 36.5 33.3*  MCV 94.7   < > 94.8 98.6 95.4 98.1 96.5  PLT 339   < > 328 335 346 356 363   < > = values in this interval not displayed.   Basic Metabolic Panel: Recent Labs  Lab 01/22/20 1701 01/23/20 0421 01/24/20 0522 01/25/20 0638 01/26/20 0239  NA 135 139 139 138 143  K 3.2* 3.6 4.1 4.3 4.5  CL 94* 97* 95* 98 100  CO2 29 32 34* 28 28  GLUCOSE 371* 131* 266* 392* 227*  BUN 16 15 16 14 19   CREATININE 0.88 0.74 0.87 0.89 0.85  CALCIUM 8.5* 8.6* 8.9 9.1 9.6  MG  --  1.6*  --   --   --   PHOS  --  3.7  --   --   --    GFR: Estimated Creatinine Clearance: 99.2 mL/min (by C-G formula based on SCr of 0.85 mg/dL). Liver Function Tests: Recent Labs  Lab 01/22/20 1701 01/23/20 0421  AST 37 31  ALT 30 27  ALKPHOS 155* 140*  BILITOT 0.8 0.7  PROT 7.4 6.8  ALBUMIN 2.5* 2.5*   No results for input(s): LIPASE, AMYLASE in the last 168 hours. No results for input(s): AMMONIA in the last 168 hours. Coagulation Profile: Recent Labs  Lab 01/23/20 0421  INR 1.4*   Cardiac Enzymes: No results for input(s): CKTOTAL, CKMB, CKMBINDEX, TROPONINI in the last 168 hours. BNP (last 3 results) No results for input(s): PROBNP in the last 8760 hours. HbA1C: No results for input(s): HGBA1C in the last 72 hours. CBG: Recent Labs  Lab 01/26/20 1941 01/27/20 0000 01/27/20 0345 01/27/20 0821 01/27/20 1233  GLUCAP 367* 181* 98 163* 301*   Lipid Profile: No results for  input(s): CHOL, HDL, LDLCALC, TRIG, CHOLHDL, LDLDIRECT in the last 72 hours. Thyroid Function Tests: No results for input(s): TSH, T4TOTAL, FREET4, T3FREE, THYROIDAB in the last 72 hours. Anemia Panel: No results for input(s): VITAMINB12, FOLATE, FERRITIN, TIBC, IRON, RETICCTPCT in the last 72 hours.    Radiology Studies: I have reviewed all of the imaging during this hospital visit personally     Scheduled Meds: . acetaminophen  500 mg Oral Q6H  . apixaban  5 mg Oral BID  . atorvastatin  40 mg Oral Daily  . buPROPion  100 mg Oral BID  . celecoxib  200 mg Oral BID  . docusate sodium  100 mg Oral BID  . DULoxetine  30 mg Oral BID  . feeding supplement (GLUCERNA SHAKE)  237 mL Oral BID  . insulin aspart  0-15 Units Subcutaneous Q4H  . insulin glargine  20 Units Subcutaneous Daily  . lisinopril  2.5 mg Oral Daily  . metoprolol tartrate  50 mg Oral BID  . mometasone-formoterol  2 puff Inhalation BID  . nicotine  21 mg Transdermal Daily  . pantoprazole  40 mg Oral Daily  . traZODone  50 mg Oral QHS   Continuous Infusions: . cefTRIAXone (ROCEPHIN)  IV    . lactated ringers 10 mL/hr at 01/25/20 0945  . vancomycin       LOS: 5 days        Taren Toops Annett Gula, MD

## 2020-01-27 NOTE — Progress Notes (Addendum)
Pharmacy Antibiotic Note  Nicole [name] is a 54 y.o. female admitted on 01/22/2020 with L foot cellulitis/osteomyelitis. Patient was receiving Zosyn and Vancomycin which were stopped 24 hrs after BKA on 12/3. Today ortho recommended to continue antibiotics due to post op infection.  Pharmacy has been consulted for vancomycin dosing. Normalized CrCl is ~85. WBC 15.2. Afebrile.   Plan: Start vancomycin 1000mg  q12h, no loading dose  Start ceftriaxone 2g IV q24h per discussion with Dr.  Monitor renal function, length of therapy, and clinical progression   Height: 5\' 3"  (160 cm) Weight: 131.5 kg (290 lb) IBW/kg (Calculated) : 52.4  Temp (24hrs), Avg:98.4 F (36.9 C), Min:98 F (36.7 C), Max:99.3 F (37.4 C)  Recent Labs  Lab 01/22/20 1701 01/22/20 1701 01/22/20 2015 01/22/20 2247 01/23/20 0421 01/24/20 0522 01/25/20 0638 01/26/20 0239 01/27/20 0244  WBC 19.2*   < >  --   --  17.1* 15.4* 17.5* 19.0* 15.2*  CREATININE 0.88  --   --   --  0.74 0.87 0.89 0.85  --   LATICACIDVEN  --   --  1.6 1.6  --   --   --   --   --    < > = values in this interval not displayed.    Estimated Creatinine Clearance: 99.2 mL/min (by C-G formula based on SCr of 0.85 mg/dL).    Allergies  Allergen Reactions  . Aspirin Anaphylaxis and Other (See Comments)    Throat closing   . Bee Pollen Anaphylaxis  . Bee Venom Anaphylaxis  . Food Anaphylaxis    PINEAPPLE  . Pineapple Anaphylaxis  . E-Mycin [Erythromycin Base] Nausea And Vomiting  . Other Rash    Antimicrobials this admission: Vancomycin 11/30 >> 12/5 Zosyn 11/30 >> 12/4  Dose adjustments this admission: N/a  Microbiology results: 11/30 bcx: ngf  Thank you for allowing pharmacy to be a part of this patient's care.  14/4, PharmD Clinical Pharmacist  01/27/2020 12:31 PM

## 2020-01-27 NOTE — Progress Notes (Signed)
     Subjective: 2 Days Post-Op Procedure(s) (LRB): LEFT BELOW KNEE AMPUTATION (Left) Awake, alert and oriented x 4. Left BKA.   Patient reports pain as moderate.    Objective:   VITALS:  Temp:  [98 F (36.7 C)-99.3 F (37.4 C)] 98.2 F (36.8 C) (12/05 0815) Pulse Rate:  [95-106] 106 (12/05 0815) Resp:  [18-20] 19 (12/05 0815) BP: (112-158)/(52-91) 158/86 (12/05 0815) SpO2:  [91 %-97 %] 95 % (12/05 0815)  Neurologically intact ABD soft Neurovascular intact Sensation intact distally Intact pulses distally Dorsiflexion/Plantar flexion intact Incision: dressing C/D/I and VAC to negative pressure doing well. No cellulitis present Compartment soft   LABS Recent Labs    01/25/20 0638 01/25/20 0638 01/26/20 0239 01/27/20 0244  HGB 10.2*  --  11.0* 10.4*  WBC 17.5*   < > 19.0* 15.2*  PLT 346   < > 356 363   < > = values in this interval not displayed.   Recent Labs    01/25/20 0638 01/26/20 0239  NA 138 143  K 4.3 4.5  CL 98 100  CO2 28 28  BUN 14 19  CREATININE 0.89 0.85  GLUCOSE 392* 227*   No results for input(s): LABPT, INR in the last 72 hours.   Assessment/Plan: 2 Days Post-Op Procedure(s) (LRB): LEFT BELOW KNEE AMPUTATION (Left)  Advance diet Up with therapy Continue ABX therapy due to Post-op infection  Vira Browns 01/27/2020, 12:02 PMPatient ID: Nicole [name], female   DOB: [birth year], 53 y.o.   MRN: [id number]

## 2020-01-28 ENCOUNTER — Telehealth (HOSPITAL_COMMUNITY): Payer: Self-pay | Admitting: Physical Therapy

## 2020-01-28 LAB — BASIC METABOLIC PANEL
Anion gap: 10 (ref 5–15)
BUN: 14 mg/dL (ref 6–20)
CO2: 32 mmol/L (ref 22–32)
Calcium: 9.5 mg/dL (ref 8.9–10.3)
Chloride: 98 mmol/L (ref 98–111)
Creatinine, Ser: 0.74 mg/dL (ref 0.44–1.00)
GFR, Estimated: >60 mL/min (ref >60)
Glucose, Bld: 255 mg/dL — ABNORMAL HIGH (ref 70–99)
Potassium: 3.9 mmol/L (ref 3.5–5.1)
Sodium: 140 mmol/L (ref 135–145)

## 2020-01-28 LAB — GLUCOSE, CAPILLARY
Glucose-Capillary: 212 mg/dL — ABNORMAL HIGH (ref 70–99)
Glucose-Capillary: 236 mg/dL — ABNORMAL HIGH (ref 70–99)
Glucose-Capillary: 248 mg/dL — ABNORMAL HIGH (ref 70–99)
Glucose-Capillary: 272 mg/dL — ABNORMAL HIGH (ref 70–99)
Glucose-Capillary: 295 mg/dL — ABNORMAL HIGH (ref 70–99)
Glucose-Capillary: 377 mg/dL — ABNORMAL HIGH (ref 70–99)

## 2020-01-28 LAB — SURGICAL PATHOLOGY

## 2020-01-28 MED ORDER — INSULIN GLARGINE 100 UNIT/ML ~~LOC~~ SOLN
20.0000 [IU] | Freq: Two times a day (BID) | SUBCUTANEOUS | Status: DC
  Administered 2020-01-28 – 2020-01-30 (×5): 20 [IU] via SUBCUTANEOUS
  Filled 2020-01-28 (×7): qty 0.2

## 2020-01-28 NOTE — Progress Notes (Signed)
Patient ID: Nicole [name], female   DOB: 07/12/1965, 54 y.o.   MRN: [id number] Patient is status post left transtibial amputation.  She is resting this morning no change in the wound VAC canister anticipate discharge to skilled nursing.  Discontinue antibiotics at time of discharge.

## 2020-01-28 NOTE — Telephone Encounter (Signed)
pt cancelled the remainder of her appts because she had her foot amputated and is in the hospital

## 2020-01-28 NOTE — Progress Notes (Signed)
Inpatient Diabetes Program Recommendations  AACE/ADA: New Consensus Statement on Inpatient Glycemic Control (2015)  Target Ranges:  Prepandial:   less than 140 mg/dL      Peak postprandial:   less than 180 mg/dL (1-2 hours)      Critically ill patients:  140 - 180 mg/dL   Lab Results  Component Value Date   GLUCAP 272 (H) 01/28/2020   HGBA1C 9.6 (H) 12/25/2019    Review of Glycemic Control Results for Nicole [name], Nicole [name] (MRN [id number]) as of 01/28/2020 10:26  Ref. Range 01/27/2020 21:02 01/28/2020 00:25 01/28/2020 05:23 01/28/2020 09:12  Glucose-Capillary Latest Ref Range: 70 - 99 mg/dL 932 (H) 671 (H) 245 (H) 272 (H)   Diabetes history:  DM2 Outpatient Diabetes medications:  Levemir 100 units BID Novolog 30 units TID Glipzide 10 BID Trulicity 1.5 weekly Current orders for Inpatient glycemic control:  Novolog 0-15 units Q4H, Lantus 20 units QD, Novolog 5 units TID  Inpatient Diabetes Program Recommendations:    Now that patient has diet order consider: -Increasing Lantus to 20 units BID changing correction to Novolog 0-20 units TID & HS.   Thanks, Lujean Rave, MSN, RNC-OB Diabetes Coordinator [phone] (8a-5p)

## 2020-01-28 NOTE — Progress Notes (Signed)
PROGRESS NOTE    MAGIE CIAMPA  ZOX:[id number] DOB: August 04, 1965 DOA: 01/22/2020 PCP: [name], MD    Brief Narrative:  [name] admitted to the hospital with a working diagnosis of left foot cellulitis, infected diabetic foot ulcer/osteomyelitis complicated by sepsis, present on admission.  54 year old female with past medical history for COPD, dyslipidemia, hypertension, obesity class III, type 2 diabetes mellitus, coronary artery disease, diastolic heart failure, atrial fibrillation,andobstructive sleep apnea who presents with left foot pain. She had a recent hospitalization 10/13-10/18 for left diabetic foot infection/cellulitis, received antibiotic therapy, and was discharged home with home health services. On the day of admission the wound was evaluated by home health, it was noted to be significantly worse, and was referred to the hospital. She had persistent and worsening left foot pain. On her initial physical examination her temperature was 98.2, heart rate 111, blood pressure 133/58, respiratory rate 28, oxygen saturation 95%, her lungs are clear to auscultation bilaterally, heart S1-S2, present rhythmic, soft abdomen, no lower extremity edema. Her left foot had positive edema, positive erythema and ulcerative lesions on the plantar surface. Sodium 135, potassium 3.2, chloride 94, bicarb 29, glucose 371, white count 19.2, hemoglobin 11.0, hematocrit 35.4, platelets 339. SARS COVID-19 negative. Urinalysis more than 300 protein, specific gravity 1.024, 0-5 white cells.  Left foot radiograph with extensive soft tissue edema and subcutaneous emphysema, throughout the left foot compatible with infection. Cortical irregularity lateral margin of the distal tuft first distal phalanx consistent with osteomyelitis.  Patient was placed on broad-spectrum antibiotic therapy, orthopedics was consulted,andpatient has been transferred to [facility]  for further surgical orthopedic intervention.  Patient underwent left transtibial amputation on 12/03.  She continue to be very weak and deconditioned, plan to transfer to SNF.    Assessment & Plan:   Principal Problem:   Sepsis due to Lt foot osteo Active Problems:   Coronary atherosclerosis of native coronary artery/Stent in 2006   Diabetes mellitus type 2 in obese (HCC)   HTN (hypertension), benign   COPD (chronic obstructive pulmonary disease) (HCC)   Diabetic ulcer of left foot (HCC)   Cellulitis of left foot   Osteomyelitis of left foot (HCC)   Hypokalemia   Hypoalbuminemia   Hyperglycemia due to diabetes mellitus (HCC)   Morbid obesity with body mass index (BMI) of 50.0 to 59.9 in adult [facility])   Tobacco abuse--2 P/day Smoker   Acute osteomyelitis of toe, left (HCC)   1. Left foot osteomyelitis, diabetic foot infection, complicated with sepsis present on admission. spleft transtibial amputation. Better pain control today, no nausea or vomiting. Follow wbc in am. Plan to continue with IV antibiotics whiled hospitalized, dc at the time of transfer to SNF.  Continue with vancomycin and ceftriaxone.     Continue pain control with hydromorphone, oxycodone, celebrex bid and acetaminophen.  Trazodone at night for sleep.   2. HTN/ CAD.Continue blood pressure control withmetoprolol and lisinopril.  3. Uncontrolled T2DM/ dyslipidemia. fasting glucose this am 255 mg/dl.  Capillary 212, 248, 272, 236.   Plan to increase basal insulin to 20 units bid, continue with 5 units of meal coverage plus insulin sliding scale. Patient is tolerating po well.    On atorvastatin.   4. Hypokalemia, hypomagnesemia, metabolic alkalosis.Renal function with stable cr at 0,74, K is 3,9 and bicarbonate is 32.  Patient is tolerating po well. Continue Cpap when sleeping.   5. Obesity class 3/ OSA. BMI51.3.high risk for inpatient complications.  On Cpap  at night and when napping.  Pending transfer to SNF  6. Chronic atrial fibrillation.continue rate control with metoprolol and anticoagulation with apixaban,.  Continue holding on clopidogrel for now due to fall risk.   7. COPD/ tobacco abuse. Ondulera andnicotine patch. No acute signs of  exacerbation.   8. Depression/ insomnia.Continue withduloxetineand bupropion.  Trazodone qhs.   Status is: Inpatient  Remains inpatient appropriate because:Unsafe d/c plan   Dispo: The patient is from: Home              Anticipated d/c is to: SNF              Anticipated d/c date is: 1 day              Patient currently is medically stable to d/c.   DVT prophylaxis: apixaban  Code Status:   full  Family Communication:  I spoke over the phone with the patient's daughter about patient's  condition, plan of care, prognosis and all questions were addressed.   Consultants:  Orthopedics  Procedures:  Transtibial amputation on the lft  Antimicrobials:  Vancomycin and Zosyn    Subjective: Patient is feeling better today, no nausea or vomiting, her pain has improved, no chest pain or dyspnea, she has been using cpap, continue to have interrupted sleep.   Objective: Vitals:   01/27/20 2018 01/27/20 2022 01/28/20 0521 01/28/20 0745  BP:   (!) 157/85 (!) 141/79  Pulse:  100 (!) 110 100  Resp:  (!) 22 16 17   Temp:   97.8 F (36.6 C) 97.7 F (36.5 C)  TempSrc:   Oral Oral  SpO2: 96% 96% 98% 99%  Weight:      Height:        Intake/Output Summary (Last 24 hours) at 01/28/2020 1334 Last data filed at 01/28/2020 1100 Gross per 24 hour  Intake 1111.48 ml  Output --  Net 1111.48 ml   Filed Weights   01/22/20 1508  Weight: 131.5 kg    Examination:   General: Not in pain or dyspnea, deconditioned  Neurology: Awake and alert, non focal  E ENT: no pallor, no icterus, oral mucosa moist Cardiovascular: No JVD. S1-S2 present, rhythmic, no gallops, rubs, or murmurs. No lower extremity  edema. Pulmonary: positive breath sounds bilaterally, adequate air movement, no wheezing, rhonchi or rales. Gastrointestinal. Abdomen soft and non tender Skin. No rashes Musculoskeletal: no joint deformities     Data Reviewed: I have personally reviewed following labs and imaging studies  CBC: Recent Labs  Lab 01/22/20 1701 01/22/20 1701 01/23/20 0421 01/24/20 0522 01/25/20 14/03/21 01/26/20 0239 01/27/20 0244  WBC 19.2*   < > 17.1* 15.4* 17.5* 19.0* 15.2*  NEUTROABS 14.1*  --   --   --  12.2* 14.2* 11.0*  HGB 11.0*   < > 11.0* 10.3* 10.2* 11.0* 10.4*  HCT 35.4*   < > 34.7* 34.2* 33.4* 36.5 33.3*  MCV 94.7   < > 94.8 98.6 95.4 98.1 96.5  PLT 339   < > 328 335 346 356 363   < > = values in this interval not displayed.   Basic Metabolic Panel: Recent Labs  Lab 01/23/20 0421 01/24/20 0522 01/25/20 0638 01/26/20 0239 01/28/20 0818  NA 139 139 138 143 140  K 3.6 4.1 4.3 4.5 3.9  CL 97* 95* 98 100 98  CO2 32 34* 28 28 32  GLUCOSE 131* 266* 392* 227* 255*  BUN 15 16 14 19 14   CREATININE 0.74 0.87 0.89 0.85  0.74  CALCIUM 8.6* 8.9 9.1 9.6 9.5  MG 1.6*  --   --   --   --   PHOS 3.7  --   --   --   --    GFR: Estimated Creatinine Clearance: 105.4 mL/min (by C-G formula based on SCr of 0.74 mg/dL). Liver Function Tests: Recent Labs  Lab 01/22/20 1701 01/23/20 0421  AST 37 31  ALT 30 27  ALKPHOS 155* 140*  BILITOT 0.8 0.7  PROT 7.4 6.8  ALBUMIN 2.5* 2.5*   No results for input(s): LIPASE, AMYLASE in the last 168 hours. No results for input(s): AMMONIA in the last 168 hours. Coagulation Profile: Recent Labs  Lab 01/23/20 0421  INR 1.4*   Cardiac Enzymes: No results for input(s): CKTOTAL, CKMB, CKMBINDEX, TROPONINI in the last 168 hours. BNP (last 3 results) No results for input(s): PROBNP in the last 8760 hours. HbA1C: No results for input(s): HGBA1C in the last 72 hours. CBG: Recent Labs  Lab 01/27/20 2102 01/28/20 0025 01/28/20 0523 01/28/20 0912  01/28/20 1124  GLUCAP 212* 212* 248* 272* 236*   Lipid Profile: No results for input(s): CHOL, HDL, LDLCALC, TRIG, CHOLHDL, LDLDIRECT in the last 72 hours. Thyroid Function Tests: No results for input(s): TSH, T4TOTAL, FREET4, T3FREE, THYROIDAB in the last 72 hours. Anemia Panel: No results for input(s): VITAMINB12, FOLATE, FERRITIN, TIBC, IRON, RETICCTPCT in the last 72 hours.    Radiology Studies: I have reviewed all of the imaging during this hospital visit personally     Scheduled Meds: . acetaminophen  500 mg Oral Q6H  . apixaban  5 mg Oral BID  . atorvastatin  40 mg Oral Daily  . buPROPion  100 mg Oral BID  . celecoxib  200 mg Oral BID  . docusate sodium  100 mg Oral BID  . DULoxetine  30 mg Oral BID  . feeding supplement (GLUCERNA SHAKE)  237 mL Oral BID  . insulin aspart  0-15 Units Subcutaneous Q4H  . insulin aspart  5 Units Subcutaneous TID WC  . insulin glargine  20 Units Subcutaneous BID  . lisinopril  2.5 mg Oral Daily  . metoprolol tartrate  50 mg Oral BID  . mometasone-formoterol  2 puff Inhalation BID  . nicotine  21 mg Transdermal Daily  . pantoprazole  40 mg Oral Daily  . traZODone  50 mg Oral QHS   Continuous Infusions: . cefTRIAXone (ROCEPHIN)  IV 2 g (01/28/20 1015)  . lactated ringers 10 mL/hr at 01/25/20 0945  . vancomycin 1,000 mg (01/28/20 1123)     LOS: 6 days        Mylez Venable Annett Gula, MD

## 2020-01-29 ENCOUNTER — Ambulatory Visit (HOSPITAL_COMMUNITY): Payer: Medicare (Managed Care) | Admitting: Physical Therapy

## 2020-01-29 LAB — CBC WITH DIFFERENTIAL/PLATELET
Abs Immature Granulocytes: 0.2 10*3/uL — ABNORMAL HIGH (ref 0.00–0.07)
Basophils Absolute: 0.1 10*3/uL (ref 0.0–0.1)
Basophils Relative: 1 %
Eosinophils Absolute: 0.4 10*3/uL (ref 0.0–0.5)
Eosinophils Relative: 3 %
HCT: 34 % — ABNORMAL LOW (ref 36.0–46.0)
Hemoglobin: 10.3 g/dL — ABNORMAL LOW (ref 12.0–15.0)
Immature Granulocytes: 1 %
Lymphocytes Relative: 21 %
Lymphs Abs: 2.9 10*3/uL (ref 0.7–4.0)
MCH: 29.1 pg (ref 26.0–34.0)
MCHC: 30.3 g/dL (ref 30.0–36.0)
MCV: 96 fL (ref 80.0–100.0)
Monocytes Absolute: 0.8 10*3/uL (ref 0.1–1.0)
Monocytes Relative: 6 %
Neutro Abs: 9.7 10*3/uL — ABNORMAL HIGH (ref 1.7–7.7)
Neutrophils Relative %: 68 %
Platelets: 347 10*3/uL (ref 150–400)
RBC: 3.54 MIL/uL — ABNORMAL LOW (ref 3.87–5.11)
RDW: 14.3 % (ref 11.5–15.5)
WBC: 14.2 10*3/uL — ABNORMAL HIGH (ref 4.0–10.5)
nRBC: 0 % (ref 0.0–0.2)

## 2020-01-29 LAB — GLUCOSE, CAPILLARY
Glucose-Capillary: 113 mg/dL — ABNORMAL HIGH (ref 70–99)
Glucose-Capillary: 161 mg/dL — ABNORMAL HIGH (ref 70–99)
Glucose-Capillary: 195 mg/dL — ABNORMAL HIGH (ref 70–99)
Glucose-Capillary: 219 mg/dL — ABNORMAL HIGH (ref 70–99)
Glucose-Capillary: 272 mg/dL — ABNORMAL HIGH (ref 70–99)
Glucose-Capillary: 312 mg/dL — ABNORMAL HIGH (ref 70–99)

## 2020-01-29 MED ORDER — GLUCERNA SHAKE PO LIQD: 237.0000 mL | Freq: Two times a day (BID) | ORAL | 0 refills | Status: AC

## 2020-01-29 MED ORDER — OXYCODONE HCL 10 MG PO TABS: 5.0000 mg | ORAL_TABLET | Freq: Four times a day (QID) | ORAL | 0 refills | Status: DC | PRN

## 2020-01-29 MED ORDER — INSULIN DETEMIR 100 UNIT/ML ~~LOC~~ SOLN
40.0000 [IU] | Freq: Two times a day (BID) | SUBCUTANEOUS | 11 refills | Status: DC
Start: 2020-01-29 — End: 2020-05-15

## 2020-01-29 MED ORDER — INSULIN ASPART 100 UNIT/ML ~~LOC~~ SOLN
0.0000 [IU] | SUBCUTANEOUS | 11 refills | Status: DC
Start: 2020-01-29 — End: 2020-05-15

## 2020-01-29 MED ORDER — ACETAMINOPHEN 500 MG PO TABS: 500.0000 mg | ORAL_TABLET | Freq: Four times a day (QID) | ORAL | 0 refills | Status: DC | PRN

## 2020-01-29 NOTE — Progress Notes (Signed)
Physical Therapy Treatment Patient Details Name: Nicole [name] MRN: [id number] DOB: 1966/01/30 Today's Date: 01/29/2020    History of Present Illness Pt is a 54 y.o. female admitted 01/22/20 with worsening L foot infection and osteomyelitis. S/p L transtibial amputation 12/3. PMH includes COPD, HTN, DM, morbid obesity, CHF, CAD, afib on Eliquis.   PT Comments    Pt progressing well with mobility. Tolerated multiple bouts of transfer training and pre-gait activity this session with up to modA (+2 safety); attempted hopping, but pt unable to accept full weight through BUEs in order to clear R foot. Pt motivated to participate and regain PLOF. Continue to recommend SNF-level therapies to maximize functional mobility and independence.    Follow Up Recommendations  SNF;Supervision for mobility/OOB     Equipment Recommendations  Rolling walker with 5" wheels;3in1 (PT);Wheelchair (measurements PT);Wheelchair cushion (measurements PT)    Recommendations for Other Services       Precautions / Restrictions Precautions Precautions: Fall;Other (comment) Precaution Comments: L BKA wound vac, 3L O2 baseline Required Braces or Orthoses: Other Brace Other Brace: L BKA limb guard Restrictions Weight Bearing Restrictions: Yes LLE Weight Bearing: Non weight bearing    Mobility  Bed Mobility Overal bed mobility: Needs Assistance Bed Mobility: Supine to Sit     Supine to sit: Supervision;HOB elevated     General bed mobility comments: Increased time and effort with use of bed rail, no physical assist required  Transfers Overall transfer level: Needs assistance Equipment used: None;Rolling walker (2 wheeled) Transfers: Lateral/Scoot Transfers;Sit to/from Stand Sit to Stand: Mod assist;+2 safety/equipment        Lateral/Scoot Transfers: Min assist;+2 safety/equipment General transfer comment: Initial lateral scoot towards drop arm recliner on R-side with minA for stability and line  management (+2 assist for safety), intermittent seated rest breaks during scooting due to SOB and fatigue. Once in recliner, performed sit<>stand to RW with modA for trnk elevation and stability (+2 safety/lines)  Ambulation/Gait             General Gait Details: Trialled hop on R foot with RW, pt unable to completely clear foot from floor despite modA for stability and heavy BUE support on RW; still tolerating pre-gait activity well   Stairs             Wheelchair Mobility    Modified Rankin (Stroke Patients Only)       Balance Overall balance assessment: Needs assistance Sitting-balance support: No upper extremity supported Sitting balance-Leahy Scale: Good     Standing balance support: Bilateral upper extremity supported;During functional activity Standing balance-Leahy Scale: Poor Standing balance comment: Reliant on BUE support for static standing balance                            Cognition Arousal/Alertness: Awake/alert Behavior During Therapy: WFL for tasks assessed/performed Overall Cognitive Status: Impaired/Different from baseline Area of Impairment: Problem solving                             Problem Solving: Slow processing General Comments: Sometimes taking increased time to respond to question or command, but unsure if true slowed processing; otherwise WFL, but not formally assessed      Exercises      General Comments General comments (skin integrity, edema, etc.): Limb guard removed to check skin integrity and limb shrinker readjusted for better fit - encouraged pt to have limb guard removed  at least 1x/day for skin check, otherwise leave on to ensure knee resting in extension      Pertinent Vitals/Pain Pain Assessment: No/denies pain Pain Intervention(s): Monitored during session    Home Living                      Prior Function            PT Goals (current goals can now be found in the care plan  section) Progress towards PT goals: Progressing toward goals    Frequency    Min 3X/week      PT Plan Current plan remains appropriate    Co-evaluation              AM-PAC PT "6 Clicks" Mobility   Outcome Measure  Help needed turning from your back to your side while in a flat bed without using bedrails?: None Help needed moving from lying on your back to sitting on the side of a flat bed without using bedrails?: A Little Help needed moving to and from a bed to a chair (including a wheelchair)?: A Little Help needed standing up from a chair using your arms (e.g., wheelchair or bedside chair)?: A Little Help needed to walk in hospital room?: A Lot Help needed climbing 3-5 steps with a railing? : Total 6 Click Score: 16    End of Session Equipment Utilized During Treatment: Gait belt Activity Tolerance: Patient tolerated treatment well Patient left: in chair;with call bell/phone within reach;with chair alarm set Nurse Communication: Mobility status;Other (comment) (pt requesting purewick replaced) PT Visit Diagnosis: Unsteadiness on feet (R26.81);Other abnormalities of gait and mobility (R26.89);Repeated falls (R29.6);Muscle weakness (generalized) (M62.81)     Time: 5929-2446 PT Time Calculation (min) (ACUTE ONLY): 31 min  Charges:  $Therapeutic Activity: 23-37 mins                     Ina Homes, PT, DPT Acute Rehabilitation Services  Pager [phone] Office [phone]  Malachy Chamber 01/29/2020, 1:04 PM

## 2020-01-29 NOTE — Progress Notes (Signed)
   01/29/20 1100  Clinical Encounter Type  Visited With Patient  Visit Type Initial  Referral From Nurse  Consult/Referral To Chaplain  Spiritual Encounters  Spiritual Needs Prayer;Ritual  Stress Factors  Patient Stress Factors Health changes  Family Stress Factors None identified  Chaplain visited with patient. Patient was very positive about going to a rehab facility that she wanted to go to near her home. Positive about getting a prosthesis and being able to walk. Does not want to be in a wheelchair.

## 2020-01-29 NOTE — TOC Initial Note (Addendum)
Transition of Care [facility]) - Initial/Assessment Note    Patient Details  Name: Nicole [name] MRN: [id number] Date of Birth: Aug 01, 1965  [facility]) CM/SW Contact:    Epifanio Lesches, RN Phone Number: 01/29/2020, 4:24 PM  Clinical Narrative:                 RNCM received consult for possible SNF placement at time of discharge. RNCM spoke with patient regarding PT recommendation of SNF placement at time of discharge. Patient reported that she  is currently unable to care for self independently at  home givencurrent physical needs and fall risk. Patient expressed understanding of PT recommendation and is agreeable to SNF placement at time of discharge. Patient reports preference for  SNF in [place] or [place] . RNCM discussed insurance authorization process and provided Medicare SNF ratings list. Patient expressed being hopeful for rehab and to feel better soon. No further questions reported at this time. RNCM to continue to follow and assist with discharge planning needs.         Patient Goals and CMS Choice        Expected Discharge Plan and Services                                                Prior Living Arrangements/Services                       Activities of Daily Living Home Assistive Devices/Equipment: None ADL Screening (condition at time of admission) Patient's cognitive ability adequate to safely complete daily activities?: Yes Is the patient deaf or have difficulty hearing?: No Does the patient have difficulty seeing, even when wearing glasses/contacts?: No Does the patient have difficulty concentrating, remembering, or making decisions?: No Patient able to express need for assistance with ADLs?: Yes Does the patient have difficulty dressing or bathing?: No Independently performs ADLs?: Yes (appropriate for developmental age) Does the patient have difficulty walking or climbing stairs?: No Weakness of Legs: None Weakness of  Arms/Hands: None  Permission Sought/Granted                  Emotional Assessment              Admission diagnosis:  Cellulitis of left foot [L03.116] Acute osteomyelitis of toe, left [facility]) [E31.540] Patient Active Problem List   Diagnosis Date Noted  . Acute osteomyelitis of toe, left (HCC)   . Sepsis due to Lt foot osteo 01/23/2020  . Morbid obesity with body mass index (BMI) of 50.0 to 59.9 in adult [facility] 01/23/2020  . Tobacco abuse--2 P/day Smoker 01/23/2020  . Cellulitis of left foot 01/22/2020  . Osteomyelitis of left foot (HCC) 01/22/2020  . Leukocytosis 01/22/2020  . Hypokalemia 01/22/2020  . Hypoalbuminemia 01/22/2020  . Hyperglycemia due to diabetes mellitus (HCC) 01/22/2020  . Elevated alpha fetoprotein 01/22/2020  . SIRS (systemic inflammatory response syndrome) (HCC) 01/22/2020  . Diabetic ulcer of left foot (HCC) 12/05/2019  . Cough 05/26/2012  . TIA (transient ischemic attack) 04/10/2012  . Acute diastolic congestive heart failure (HCC) 11/12/2011  . COPD (chronic obstructive pulmonary disease) (HCC) 11/09/2011  . Dyspnea 06/10/2011  . CAP (community acquired pneumonia) 02/19/2011  . Coronary atherosclerosis of native coronary artery/Stent in 2006 02/19/2011  . Dyslipidemia   . Morbid obesity (HCC)   . Diabetes mellitus  type 2 in obese (HCC)   . HTN (hypertension), benign    PCP:  [name], MD Pharmacy:   [facility] [address] [place] [address] [place] [address] [place] [address] [place] [address] Phone: [phone] Fax: [phone]     Social Determinants of Health (SDOH) Interventions    Readmission Risk Interventions No flowsheet data found.

## 2020-01-29 NOTE — NC FL2 (Signed)
[place] MEDICAID FL2 LEVEL OF CARE SCREENING TOOL     IDENTIFICATION  Patient Name: Nicole [name] Birthdate: 1966/01/03 Sex: female Admission Date (Current Location): 01/22/2020  [facility] and [place] Number:  Producer, television/film/video and Address:  [facility] [place], [address] [place], [place] [address]      Provider Number: [id number]  Attending Physician Name and Address:  Coralie Keens  Relative Name and Phone Number:       Current Level of Care: SNF Recommended Level of Care: Skilled Nursing Facility Prior Approval Number:    Date Approved/Denied:   PASRR Number: [id number]  Discharge Plan:      Current Diagnoses: Patient Active Problem List   Diagnosis Date Noted  . Acute osteomyelitis of toe, left (HCC)   . Sepsis due to Lt foot osteo 01/23/2020  . Morbid obesity with body mass index (BMI) of 50.0 to 59.9 in adult [facility]) 01/23/2020  . Tobacco abuse--2 P/day Smoker 01/23/2020  . Cellulitis of left foot 01/22/2020  . Osteomyelitis of left foot (HCC) 01/22/2020  . Leukocytosis 01/22/2020  . Hypokalemia 01/22/2020  . Hypoalbuminemia 01/22/2020  . Hyperglycemia due to diabetes mellitus (HCC) 01/22/2020  . Elevated alpha fetoprotein 01/22/2020  . SIRS (systemic inflammatory response syndrome) (HCC) 01/22/2020  . Diabetic ulcer of left foot (HCC) 12/05/2019  . Cough 05/26/2012  . TIA (transient ischemic attack) 04/10/2012  . Acute diastolic congestive heart failure (HCC) 11/12/2011  . COPD (chronic obstructive pulmonary disease) (HCC) 11/09/2011  . Dyspnea 06/10/2011  . CAP (community acquired pneumonia) 02/19/2011  . Coronary atherosclerosis of native coronary artery/Stent in 2006 02/19/2011  . Dyslipidemia   . Morbid obesity (HCC)   . Diabetes mellitus type 2 in obese (HCC)   . HTN (hypertension), benign     Orientation RESPIRATION BLADDER Height & Weight     Self, Time, Situation, Place  O2 (Sun @ 3L/MIN) External  catheter Weight: 131.5 kg Height:  5\' 3"  (160 cm)  BEHAVIORAL SYMPTOMS/MOOD NEUROLOGICAL BOWEL NUTRITION STATUS      Continent Diet (refer to d/c summary)  AMBULATORY STATUS COMMUNICATION OF NEEDS Skin   Extensive Assist Verbally Surgical wounds (s/p L BKA, 12/3)                       Personal Care Assistance Level of Assistance  Bathing, Feeding, Dressing Bathing Assistance: Maximum assistance Feeding assistance: Independent Dressing Assistance: Maximum assistance     Functional Limitations Info  Sight, Hearing Sight Info: Adequate Hearing Info: Adequate      SPECIAL CARE FACTORS FREQUENCY  PT (By licensed PT), OT (By licensed OT)     PT Frequency: 5x/week, evaluate and treat OT Frequency: 5x/week, evaluate and treat            Contractures Contractures Info: Not present    Additional Factors Info  Code Status, Allergies Code Status Info: Full Code Allergies Info: Aspirin, Bee Pollen, Bee Venom, Food, Pineapple, E-mycin Erythromycin Base, Other           Current Medications (01/29/2020):  This is the current hospital active medication list Current Facility-Administered Medications  Medication Dose Route Frequency Provider Last Rate Last Admin  . acetaminophen (TYLENOL) tablet 500 mg  500 mg Oral Q6H Arrien, 14/08/2019, MD   500 mg at 01/29/20 1100  . albuterol (PROVENTIL) (2.5 MG/3ML) 0.083% nebulizer solution 2.5 mg  2.5 mg Nebulization Q6H PRN Adefeso, Oladapo, DO      . albuterol (  VENTOLIN HFA) 108 (90 Base) MCG/ACT inhaler 2 puff  2 puff Inhalation PRN Adefeso, Oladapo, DO      . apixaban (ELIQUIS) tablet 5 mg  5 mg Oral BID Arrien, York Ram, MD   5 mg at 01/29/20 1015  . atorvastatin (LIPITOR) tablet 40 mg  40 mg Oral Daily Adefeso, Oladapo, DO   40 mg at 01/29/20 1015  . buPROPion [facility]) tablet 100 mg  100 mg Oral BID Coralie Keens, MD   100 mg at 01/29/20 1016  . cefTRIAXone (ROCEPHIN) 2 g in sodium chloride 0.9 % 100 mL IVPB   2 g Intravenous Q24H Vicente Serene, RPH 200 mL/hr at 01/29/20 1024 2 g at 01/29/20 1024  . celecoxib (CELEBREX) capsule 200 mg  200 mg Oral BID Coralie Keens, MD   200 mg at 01/29/20 1015  . docusate sodium (COLACE) capsule 100 mg  100 mg Oral BID Persons, [place], PA   100 mg at 01/29/20 1014  . DULoxetine (CYMBALTA) DR capsule 30 mg  30 mg Oral BID Mariea Clonts, Courage, MD   30 mg at 01/29/20 1015  . feeding supplement (GLUCERNA SHAKE) (GLUCERNA SHAKE) liquid 237 mL  237 mL Oral BID Emokpae, Courage, MD   237 mL at 01/29/20 1040  . HYDROmorphone (DILAUDID) injection 1 mg  1 mg Intravenous Q4H PRN Arrien, York Ram, MD   1 mg at 01/28/20 1014  . insulin aspart (novoLOG) injection 0-15 Units  0-15 Units Subcutaneous Q4H Adefeso, Oladapo, DO   3 Units at 01/29/20 1225  . insulin aspart (novoLOG) injection 5 Units  5 Units Subcutaneous TID WC Arrien, York Ram, MD   5 Units at 01/29/20 1225  . insulin glargine (LANTUS) injection 20 Units  20 Units Subcutaneous BID Coralie Keens, MD   20 Units at 01/29/20 1019  . lactated ringers infusion   Intravenous Continuous Bethena Midget, MD 10 mL/hr at 01/25/20 0945 Continued from Pre-op at 01/25/20 0945  . lisinopril (ZESTRIL) tablet 2.5 mg  2.5 mg Oral Daily Adefeso, Oladapo, DO   2.5 mg at 01/29/20 1015  . metoCLOPramide (REGLAN) tablet 5-10 mg  5-10 mg Oral Q8H PRN Persons, [place], PA       Or  . metoCLOPramide (REGLAN) injection 5-10 mg  5-10 mg Intravenous Q8H PRN Persons, [place], PA      . metoprolol tartrate (LOPRESSOR) tablet 50 mg  50 mg Oral BID Adefeso, Oladapo, DO   50 mg at 01/29/20 1015  . mometasone-formoterol (DULERA) 200-5 MCG/ACT inhaler 2 puff  2 puff Inhalation BID Emokpae, Courage, MD   2 puff at 01/29/20 0911  . nicotine (NICODERM CQ - dosed in mg/24 hours) patch 21 mg  21 mg Transdermal Daily Mariea Clonts, Courage, MD   21 mg at 01/24/20 0958  . ondansetron (ZOFRAN) tablet 4 mg  4 mg Oral Q6H PRN Persons,  [place], PA       Or  . ondansetron [facility]) injection 4 mg  4 mg Intravenous Q6H PRN Persons, [place], [place]      . oxyCODONE (Oxy IR/ROXICODONE) immediate release tablet 10 mg  10 mg Oral Q4H PRN Arrien, York Ram, MD   10 mg at 01/29/20 0601  . pantoprazole (PROTONIX) EC tablet 40 mg  40 mg Oral Daily Adefeso, Oladapo, DO   40 mg at 01/29/20 1014  . traZODone (DESYREL) tablet 50 mg  50 mg Oral QHS Arrien, York Ram, MD   50 mg at 01/28/20 2305  .  vancomycin (VANCOCIN) IVPB 1000 mg/200 mL premix  1,000 mg Intravenous Q12H Vicente Serene, RPH 200 mL/hr at 01/29/20 1224 1,000 mg at 01/29/20 1224     Discharge Medications: Please see discharge summary for a list of discharge medications.  Relevant Imaging Results:  Relevant Lab Results:   Additional Information SS# [id number]  Epifanio Lesches, RN

## 2020-01-29 NOTE — Progress Notes (Addendum)
It was brought to my attention that pt fell while working with OT. Per OT it was an assisted fall. Pt seen in room alert/oriented in no apparent distress. Per pt at around 4pmish, OT was working with  Her for transfer from recliner chair to bed. Pt stated " I stood up with the walker assisted by OT and I lost my balance and landed on the floor with my right butt, while OT was trying to reach the bed closer to her.:Pt denies any pain/discomfort at this time. I',m fine as stated. Physical assessment performed , No bruises noted on the bottom. VSS. No complaints voiced. ADON made aware and attending MD made aware of  Above with no new orders at this time. Closely monitored. Daughter Bayard Males been informed. All questions and concerns were answered.

## 2020-01-29 NOTE — Care Management Important Message (Signed)
Important Message  Patient Details  Name: Nicole [name] MRN: [id number] Date of Birth: [birth year]   Medicare Important Message Given:  Yes - Important Message mailed due to current National Emergency  Verbal consent obtained due to current National Emergency  Relationship to patient: Self Contact Name: Nicole [name] Call Date: 01/29/20  Time: 1446 Phone: [phone] Outcome: Spoke with contact Important Message mailed to: Patient address on file    Orson Aloe 01/29/2020, 2:46 PM

## 2020-01-29 NOTE — Progress Notes (Signed)
Inpatient Diabetes Program Recommendations  AACE/ADA: New Consensus Statement on Inpatient Glycemic Control (2015)  Target Ranges:  Prepandial:   less than 140 mg/dL      Peak postprandial:   less than 180 mg/dL (1-2 hours)      Critically ill patients:  140 - 180 mg/dL   Lab Results  Component Value Date   GLUCAP 113 (H) 01/29/2020   HGBA1C 9.6 (H) 12/25/2019    Review of Glycemic Control Results for Nicole [name], Nicole [name] (MRN [id number]) as of 01/29/2020 11:24  Ref. Range 01/28/2020 09:12 01/28/2020 11:24 01/28/2020 16:44 01/28/2020 20:28 01/29/2020 00:22 01/29/2020 04:34 01/29/2020 08:01  Glucose-Capillary Latest Ref Range: 70 - 99 mg/dL 309 (H) 407 (H) 680 (H) 295 (H) 272 (H) 161 (H) 113 (H)   Current orders for Inpatient glycemic control:  Lantus 20 units BID Novolog 0-15 units Q4H Novolog 5 units TID with meals  Inpatient Diabetes Program Recommendations: Correction (SSI): Novolog 0-20 units TID Insulin- HS Coverage: ovolog 0-5 QHS   Will continue to follow while inpatient.  Thank you, Dulce Sellar, RN, BSN Diabetes Coordinator Inpatient Diabetes Program [phone] (team pager from 8a-5p)

## 2020-01-29 NOTE — Discharge Summary (Addendum)
Physician Discharge Summary  JAMILYA SARRAZIN HXT:[id number] DOB: 1965-09-14 DOA: 01/22/2020  PCP: Doree Albee, MD  Admit date: 01/22/2020 Discharge date: 01/29/2020  Admitted From: Home  Disposition:  SNF  Recommendations for Outpatient Follow-up and new medication changes:  1. Follow up with Dr. Anastasio Champion in 7 days.  2. Patient completed antibiotics during her hospitalization.  3. Her insulin therapy has been modified to prevent hypoglycemia, close follow up of glucose as outpatient.   Home Health: na   Equipment/Devices: wound vac  Discharge Condition: stable  CODE STATUS: full  Diet recommendation: heart healthy and diabetic prudent.   Brief/Interim Summary: [name] admitted to the hospital with a working diagnosis of left foot cellulitis, infected diabetic foot ulcer/osteomyelitis complicated by sepsis, present on admission.  54 year old female with past medical history for COPD, dyslipidemia, hypertension, obesity class III, type 2 diabetes mellitus, coronary artery disease, diastolic heart failure, atrial fibrillation,andobstructive sleep apnea who presents with left foot pain. She had a recent hospitalization 10/13-10/18 for left diabetic foot infection/cellulitis, received antibiotic therapy, and was discharged home with home health services. On the day of admission the wound was evaluated by home health, it was noted to be significantly worse, and was referred to the hospital. She had persistent and worsening left foot pain. On her initial physical examination her temperature was 98.2, heart rate 111, blood pressure 133/58, respiratory rate 28, oxygen saturation 95%, her lungs were clear to auscultation bilaterally, heart S1-S2, present rhythmic, soft abdomen, no lower extremity edema. Her left foot had positive edema, positive erythema and ulcerative lesions on the plantar surface. Sodium 135, potassium 3.2, chloride 94, bicarb 29, glucose 371, white count 19.2,  hemoglobin 11.0, hematocrit 35.4, platelets 339. SARS COVID-19 negative. Urinalysis more than 300 protein, specific gravity 1.024, 0-5 white cells.  Left foot radiograph with extensive soft tissue edema and subcutaneous emphysema, throughout the left foot compatible with infection. Cortical irregularity lateral margin of the distal tuft first distal phalanx consistent with osteomyelitis.  Patient was placed on broad-spectrum antibiotic therapy, orthopedics was consulted,andpatient was transferred [facility] for further surgical orthopedic intervention.  Patient underwent left transtibial amputation on 12/03.  Leukocytosis continue to improve. She had a wound vac in place.   She continue to be very weak and deconditioned, plan to transfer to SNF.  1.  Left foot osteomyelitis, diabetic foot infection, complicated with sepsis, present on admission.  Patient responded well to surgical and medical treatment, her leukocytosis improved.  She received vancomycin and ceftriaxone while hospitalized. For pain control she required hydromorphone, oxycodone, Celebrex and acetaminophen. Trazodone was added for sleep with good toleration.  Patient has a wound VAC in place, will follow-up with surgery as an outpatient.  2.  Hypertension/coronary artery disease.  Continue blood pressure control with metoprolol and lisinopril. At discharge will resume clopidogrel and furosemide.   3.  Uncontrolled type II diabetes mellitus, dyslipidemia.  Her glucose remained elevated, she received insulin therapy, basal, premeal and sliding scale. At home patient on 100 units of basal insulin bid, for now will resume at 40 units bid along with sliding scale. Likely patient at home not following a carb modified diet.  Close monitoring of glucose. Continue trulicity and glipizide.  Continue atorvastatin.  4.  Hypokalemia, hypomagnesemia, metabolic alkalosis.  Her electrolytes were  corrected, her kidney function remained stable. Her discharge sodium 140, potassium 3.9, chloride 98, bicarb 32, glucose 255, BUN 13, creatinine 0.74..  5.  Obesity class III, severe  sleep apnea.  Calculated BMI 51.3.  Continue CPAP at night and napping. Continue physical therapy.  6.  Chronic atrial fibrillation.  Continue rate control metoprolol, anticoagulation with apixaban.  7.  COPD, tobacco abuse.  No signs of acute exacerbation, continue Dulera.  Smoking cessation.  8.  Depression/insomnia.  Continue duloxetine and bupropion.  Trazodone was added with good toleration.   Discharge Diagnoses:  Principal Problem:   Sepsis due to Lt foot osteo Active Problems:   Coronary atherosclerosis of native coronary artery/Stent in 2006   Diabetes mellitus type 2 in obese ([place])   HTN (hypertension), benign   COPD (chronic obstructive pulmonary disease) (HCC)   Diabetic ulcer of left foot (HCC)   Cellulitis of left foot   Osteomyelitis of left foot (HCC)   Hypokalemia   Hypoalbuminemia   Hyperglycemia due to diabetes mellitus (HCC)   Morbid obesity with body mass index (BMI) of 50.0 to 59.9 in adult [facility])   Tobacco abuse--2 P/day Smoker   Acute osteomyelitis of toe, left [facility])    Discharge Instructions  Discharge Instructions    Negative Pressure Wound Therapy - Incisional   Complete by: As directed    Show patient how to attach Prevena VAC.  Patient should call office if [facility] alarms or stops working     Allergies as of 01/29/2020      Reactions   Aspirin Anaphylaxis, Other (See Comments)   Throat closing    Bee Pollen Anaphylaxis   Bee Venom Anaphylaxis   Food Anaphylaxis   PINEAPPLE   Pineapple Anaphylaxis   E-mycin [erythromycin Base] Nausea And Vomiting   Other Rash      Medication List    STOP taking these medications   insulin regular 100 units/mL injection Commonly known as: NOVOLIN R   Suprep Bowel Prep Kit 17.5-3.13-1.6 GM/177ML Soln Generic drug: Na  Sulfate-K Sulfate-Mg Sulf     TAKE these medications   acetaminophen 500 MG tablet Commonly known as: TYLENOL Take 1 tablet (500 mg total) by mouth every 6 (six) hours as needed for moderate pain.   buPROPion 100 MG 12 hr tablet Commonly known as: WELLBUTRIN SR Take 1 tablet (100 mg total) by mouth 2 (two) times daily.   citalopram 10 MG tablet Commonly known as: CELEXA Take 10 mg by mouth daily.   clopidogrel 75 MG tablet Commonly known as: PLAVIX Take 1 tablet (75 mg total) by mouth daily.   DULoxetine 60 MG capsule Commonly known as: CYMBALTA Take 1 capsule (60 mg total) by mouth 2 (two) times daily.   Easy Touch Insulin Syringe 31G X 5/16" 1 ML Misc Generic drug: Insulin Syringe-Needle U-100   Eliquis 5 MG Tabs tablet Generic drug: apixaban Take 5 mg by mouth 2 (two) times daily.   EPINEPHrine 0.3 mg/0.3 mL Soaj injection Commonly known as: EPI-PEN Inject 0.3 mg into the muscle as needed for anaphylaxis.   feeding supplement (GLUCERNA SHAKE) Liqd Take 237 mLs by mouth 2 (two) times daily.   furosemide 80 MG tablet Commonly known as: LASIX Take 1 tablet (80 mg total) by mouth daily.   glipiZIDE 10 MG tablet Commonly known as: GLUCOTROL Take 1 tablet (10 mg total) by mouth in the morning and at bedtime.   insulin aspart 100 UNIT/ML injection Commonly known as: novoLOG Inject 0-15 Units into the skin every 4 (four) hours. For glucose 121 to 150 use 2 units, for 151 to 200 use 3 units, for 201 to 250 use 5 units, for 251  to 300 use 8 units, for 301 to 350 use 11 units for 351 or greater use 15 units. What changed:   how much to take  when to take this  additional instructions   insulin detemir 100 UNIT/ML injection Commonly known as: LEVEMIR Inject 0.4 mLs (40 Units total) into the skin 2 (two) times daily. What changed: how much to take   lisinopril 2.5 MG tablet Commonly known as: ZESTRIL Take 2.5 mg by mouth daily.   loperamide 2 MG  capsule Commonly known as: IMODIUM Take 1 capsule (2 mg total) by mouth every 6 (six) hours as needed for diarrhea or loose stools.   metoprolol tartrate 50 MG tablet Commonly known as: LOPRESSOR Take 1 tablet (50 mg total) by mouth 2 (two) times daily.   mupirocin ointment 2 % Commonly known as: BACTROBAN Apply topically daily.   One-A-Day Womens Formula Tabs Take 1 tablet by mouth daily.   Oxycodone HCl 10 MG Tabs Take 0.5 tablets (5 mg total) by mouth every 6 (six) hours as needed for severe pain.   pantoprazole 40 MG tablet Commonly known as: Protonix Take 1 tablet (40 mg total) by mouth daily. Take 30-60 min before first meal of the day   simvastatin 80 MG tablet Commonly known as: ZOCOR Take 80 mg by mouth daily.   Trulicity 1.5 JO/8.3GP Sopn Generic drug: Dulaglutide Inject 1.5 mg into the skin once a week.   Ventolin HFA 108 (90 Base) MCG/ACT inhaler Generic drug: albuterol Inhale 2 puffs into the lungs as needed for wheezing. Shortness of breath   albuterol (2.5 MG/3ML) 0.083% nebulizer solution Commonly known as: PROVENTIL Take 3 mLs (2.5 mg total) by nebulization every 6 (six) hours as needed for wheezing.       Follow-up Information    Persons, Bevely Palmer, [place] In 1 week.   Specialty: Orthopedic Surgery Contact information: [address] [place] [address] [phone]              Allergies  Allergen Reactions  . Aspirin Anaphylaxis and Other (See Comments)    Throat closing   . Bee Pollen Anaphylaxis  . Bee Venom Anaphylaxis  . Food Anaphylaxis    PINEAPPLE  . Pineapple Anaphylaxis  . E-Mycin [Erythromycin Base] Nausea And Vomiting  . Other Rash    Consultations:  Orthopedics    Procedures/Studies: DG Foot Complete Left  Result Date: 01/22/2020 CLINICAL DATA:  Left great toe and first metatarsal infection, diabetes, swelling EXAM: LEFT FOOT - COMPLETE 3+ VIEW COMPARISON:  12/05/2019 FINDINGS: Frontal, oblique, lateral views  of the left foot are obtained. There is extensive soft tissue swelling throughout the left foot. Subcutaneous gas is seen throughout the left forefoot, greatest between the first and second metatarsal heads. There is cortical irregularity along the lateral margin of the distal tuft of the first distal phalanx concerning for osteomyelitis. No other acute or destructive bony lesions. Prior fifth digit AP taken at the metatarsophalangeal joint. IMPRESSION: 1. Extensive soft tissue swelling and subcutaneous emphysema throughout the left foot, compatible with infection. 2. Cortical irregularity lateral margin distal tuft first distal phalanx, consistent with osteomyelitis. Electronically Signed   By: Randa Ngo M.D.   On: 01/22/2020 15:51      Procedures: left transtibial amputation   Subjective: Patient is out of bed to chair, no nausea or vomiting, her pain has improved, no chest pain or dyspnea. Able to sleep well last night.   Discharge Exam: Vitals:   01/29/20 0911 01/29/20  1024  BP:  (!) 132/45  Pulse:  (!) 52  Resp:  19  Temp:  98 F (36.7 C)  SpO2: 95% 94%   Vitals:   01/28/20 2029 01/29/20 0435 01/29/20 0911 01/29/20 1024  BP:  133/71  (!) 132/45  Pulse:  97  (!) 52  Resp:  17  19  Temp:  98.3 F (36.8 C)  98 F (36.7 C)  TempSrc:      SpO2: 91% 97% 95% 94%  Weight:      Height:        General: Not in pain or dyspnea.  Neurology: Awake and alert, non focal  E ENT: no pallor, no icterus, oral mucosa moist Cardiovascular: No JVD. S1-S2 present, with no gallops, rubs, or murmurs.  Pulmonary: positive breath sounds bilaterally, adequate air movement, no wheezing, rhonchi or rales. Gastrointestinal. Abdomen protuberant. Skin. No rashes Musculoskeletal: left transtibial amputation.    The results of significant diagnostics from this hospitalization (including imaging, microbiology, ancillary and laboratory) are listed below for reference.     Microbiology: Recent  Results (from the past 240 hour(s))  Blood culture (routine x 2)     Status: None   Collection Time: 01/22/20  5:01 PM   Specimen: BLOOD LEFT FOREARM  Result Value Ref Range Status   Specimen Description BLOOD LEFT FOREARM  Final   Special Requests   Final    BOTTLES DRAWN AEROBIC AND ANAEROBIC Blood Culture adequate volume   Culture   Final    NO GROWTH 5 DAYS Performed at [facility], [address] [place], [place] [address]    Report Status 01/27/2020 FINAL  Final  Blood culture (routine x 2)     Status: None   Collection Time: 01/22/20  5:24 PM   Specimen: BLOOD RIGHT FOREARM  Result Value Ref Range Status   Specimen Description BLOOD RIGHT FOREARM  Final   Special Requests   Final    BOTTLES DRAWN AEROBIC AND ANAEROBIC Blood Culture adequate volume   Culture   Final    NO GROWTH 5 DAYS Performed at [facility], [address] [place], [place] [address]    Report Status 01/27/2020 FINAL  Final  Resp Panel by RT-PCR (Flu A&B, Covid) Nasopharyngeal Swab     Status: None   Collection Time: 01/22/20  5:24 PM   Specimen: Nasopharyngeal Swab; Nasopharyngeal(NP) swabs in vial transport medium  Result Value Ref Range Status   SARS Coronavirus 2 by RT PCR NEGATIVE NEGATIVE Final    Comment: (NOTE) SARS-CoV-2 target nucleic acids are NOT DETECTED.  The SARS-CoV-2 RNA is generally detectable in upper respiratory specimens during the acute phase of infection. The lowest concentration of SARS-CoV-2 viral copies this assay can detect is 138 copies/mL. A negative result does not preclude SARS-Cov-2 infection and should not be used as the sole basis for treatment or other patient management decisions. A negative result may occur with  improper specimen collection/handling, submission of specimen other than nasopharyngeal swab, presence of viral mutation(s) within the areas targeted by this assay, and inadequate number of viral copies(<138 copies/mL). A negative result must be  combined with clinical observations, patient history, and epidemiological information. The expected result is Negative.  Fact Sheet for Patients:  EntrepreneurPulse.com.au  Fact Sheet for Healthcare Providers:  IncredibleEmployment.be  This test is no t yet approved or cleared by the [place] FDA and  has been authorized for detection and/or diagnosis of SARS-CoV-2 by FDA under an Emergency Use Authorization (EUA).  This EUA will remain  in effect (meaning this test can be used) for the duration of the COVID-19 declaration under Section 564(b)(1) of the Act, 21 U.S.C.section 360bbb-3(b)(1), unless the authorization is terminated  or revoked sooner.       Influenza A by PCR NEGATIVE NEGATIVE Final   Influenza B by PCR NEGATIVE NEGATIVE Final    Comment: (NOTE) The Xpert Xpress SARS-CoV-2/FLU/RSV plus assay is intended as an aid in the diagnosis of influenza from Nasopharyngeal swab specimens and should not be used as a sole basis for treatment. Nasal washings and aspirates are unacceptable for Xpert Xpress SARS-CoV-2/FLU/RSV testing.  Fact Sheet for Patients: EntrepreneurPulse.com.au  Fact Sheet for Healthcare Providers: IncredibleEmployment.be  This test is not yet approved or cleared by the [place] FDA and has been authorized for detection and/or diagnosis of SARS-CoV-2 by FDA under an Emergency Use Authorization (EUA). This EUA will remain in effect (meaning this test can be used) for the duration of the COVID-19 declaration under Section 564(b)(1) of the Act, 21 U.S.C. section 360bbb-3(b)(1), unless the authorization is terminated or revoked.  Performed at [facility], [address], [place], [place] [address]   Surgical pcr screen     Status: None   Collection Time: 01/25/20 12:06 AM   Specimen: Nasal Mucosa; Nasal Swab  Result Value Ref Range Status   MRSA, PCR NEGATIVE NEGATIVE  Final   Staphylococcus aureus NEGATIVE NEGATIVE Final    Comment: (NOTE) The Xpert SA Assay (FDA approved for NASAL specimens in patients 22 years of age and older), is one component of a comprehensive surveillance program. It is not intended to diagnose infection nor to guide or monitor treatment. Performed at [facility], [place] [address], [place], [place] [address]      Labs: BNP (last 3 results) No results for input(s): BNP in the last 8760 hours. Basic Metabolic Panel: Recent Labs  Lab 01/23/20 0421 01/24/20 0522 01/25/20 0638 01/26/20 0239 01/28/20 0818  NA 139 139 138 143 140  K 3.6 4.1 4.3 4.5 3.9  CL 97* 95* 98 100 98  CO2 32 34* 28 28 32  GLUCOSE 131* 266* 392* 227* 255*  BUN _0 CREATININE 0.74 0.87 0.89 0.85 0.74  CALCIUM 8.6* 8.9 9.1 9.6 9.5  MG 1.6*  --   --   --   --   PHOS 3.7  --   --   --   --    Liver Function Tests: Recent Labs  Lab 01/22/20 1701 01/23/20 0421  AST 37 31  ALT 30 27  ALKPHOS 155* 140*  BILITOT 0.8 0.7  PROT 7.4 6.8  ALBUMIN 2.5* 2.5*   No results for input(s): LIPASE, AMYLASE in the last 168 hours. No results for input(s): AMMONIA in the last 168 hours. CBC: Recent Labs  Lab 01/22/20 1701 01/23/20 0421 01/24/20 0522 01/25/20 7943 01/26/20 0239 01/27/20 0244 01/29/20 0359  WBC 19.2*   < > 15.4* 17.5* 19.0* 15.2* 14.2*  NEUTROABS 14.1*  --   --  12.2* 14.2* 11.0* 9.7*  HGB 11.0*   < > 10.3* 10.2* 11.0* 10.4* 10.3*  HCT 35.4*   < > 34.2* 33.4* 36.5 33.3* 34.0*  MCV 94.7   < > 98.6 95.4 98.1 96.5 96.0  PLT 339   < > 335 346 356 363 347   < > = values in this interval not displayed.   Cardiac Enzymes: No results for input(s): CKTOTAL, CKMB, CKMBINDEX, TROPONINI  in the last 168 hours. BNP: Invalid input(s): POCBNP CBG: Recent Labs  Lab 01/28/20 2028 01/29/20 0022 01/29/20 0434 01/29/20 0801 01/29/20 1134  GLUCAP 295* 272* 161* 113* 195*   D-Dimer No results for input(s): DDIMER in the last  72 hours. Hgb A1c No results for input(s): HGBA1C in the last 72 hours. Lipid Profile No results for input(s): CHOL, HDL, LDLCALC, TRIG, CHOLHDL, LDLDIRECT in the last 72 hours. Thyroid function studies No results for input(s): TSH, T4TOTAL, T3FREE, THYROIDAB in the last 72 hours.  Invalid input(s): FREET3 Anemia work up No results for input(s): VITAMINB12, FOLATE, FERRITIN, TIBC, IRON, RETICCTPCT in the last 72 hours. Urinalysis    Component Value Date/Time   COLORURINE AMBER (A) 01/22/2020 2253   APPEARANCEUR HAZY (A) 01/22/2020 2253   LABSPEC 1.024 01/22/2020 2253   PHURINE 5.0 01/22/2020 2253   GLUCOSEU 150 (A) 01/22/2020 2253   HGBUR NEGATIVE 01/22/2020 2253   BILIRUBINUR NEGATIVE 01/22/2020 2253   KETONESUR NEGATIVE 01/22/2020 2253   PROTEINUR >=300 (A) 01/22/2020 2253   UROBILINOGEN 1.0 02/18/2011 2336   NITRITE NEGATIVE 01/22/2020 2253   LEUKOCYTESUR NEGATIVE 01/22/2020 2253   Sepsis Labs Invalid input(s): PROCALCITONIN,  WBC,  LACTICIDVEN Microbiology Recent Results (from the past 240 hour(s))  Blood culture (routine x 2)     Status: None   Collection Time: 01/22/20  5:01 PM   Specimen: BLOOD LEFT FOREARM  Result Value Ref Range Status   Specimen Description BLOOD LEFT FOREARM  Final   Special Requests   Final    BOTTLES DRAWN AEROBIC AND ANAEROBIC Blood Culture adequate volume   Culture   Final    NO GROWTH 5 DAYS Performed at [facility], [address], [place], [place] [address]    Report Status 01/27/2020 FINAL  Final  Blood culture (routine x 2)     Status: None   Collection Time: 01/22/20  5:24 PM   Specimen: BLOOD RIGHT FOREARM  Result Value Ref Range Status   Specimen Description BLOOD RIGHT FOREARM  Final   Special Requests   Final    BOTTLES DRAWN AEROBIC AND ANAEROBIC Blood Culture adequate volume   Culture   Final    NO GROWTH 5 DAYS Performed at [facility], [address] [place], [place] [address]    Report Status 01/27/2020 FINAL  Final   Resp Panel by RT-PCR (Flu A&B, Covid) Nasopharyngeal Swab     Status: None   Collection Time: 01/22/20  5:24 PM   Specimen: Nasopharyngeal Swab; Nasopharyngeal(NP) swabs in vial transport medium  Result Value Ref Range Status   SARS Coronavirus 2 by RT PCR NEGATIVE NEGATIVE Final    Comment: (NOTE) SARS-CoV-2 target nucleic acids are NOT DETECTED.  The SARS-CoV-2 RNA is generally detectable in upper respiratory specimens during the acute phase of infection. The lowest concentration of SARS-CoV-2 viral copies this assay can detect is 138 copies/mL. A negative result does not preclude SARS-Cov-2 infection and should not be used as the sole basis for treatment or other patient management decisions. A negative result may occur with  improper specimen collection/handling, submission of specimen other than nasopharyngeal swab, presence of viral mutation(s) within the areas targeted by this assay, and inadequate number of viral copies(<138 copies/mL). A negative result must be combined with clinical observations, patient history, and epidemiological information. The expected result is Negative.  Fact Sheet for Patients:  EntrepreneurPulse.com.au  Fact Sheet for Healthcare Providers:  IncredibleEmployment.be  This test is no t yet approved or cleared by  the [employer] and  has been authorized for detection and/or diagnosis of SARS-CoV-2 by FDA under an Emergency Use Authorization (EUA). This EUA will remain  in effect (meaning this test can be used) for the duration of the COVID-19 declaration under Section 564(b)(1) of the Act, 21 U.S.C.section 360bbb-3(b)(1), unless the authorization is terminated  or revoked sooner.       Influenza A by PCR NEGATIVE NEGATIVE Final   Influenza B by PCR NEGATIVE NEGATIVE Final    Comment: (NOTE) The Xpert Xpress SARS-CoV-2/FLU/RSV plus assay is intended as an aid in the diagnosis of influenza from  Nasopharyngeal swab specimens and should not be used as a sole basis for treatment. Nasal washings and aspirates are unacceptable for Xpert Xpress SARS-CoV-2/FLU/RSV testing.  Fact Sheet for Patients: EntrepreneurPulse.com.au  Fact Sheet for Healthcare Providers: IncredibleEmployment.be  This test is not yet approved or cleared by the [place] FDA and has been authorized for detection and/or diagnosis of SARS-CoV-2 by FDA under an Emergency Use Authorization (EUA). This EUA will remain in effect (meaning this test can be used) for the duration of the COVID-19 declaration under Section 564(b)(1) of the Act, 21 U.S.C. section 360bbb-3(b)(1), unless the authorization is terminated or revoked.  Performed at [facility], [address] [place], [place] [address]   Surgical pcr screen     Status: None   Collection Time: 01/25/20 12:06 AM   Specimen: Nasal Mucosa; Nasal Swab  Result Value Ref Range Status   MRSA, PCR NEGATIVE NEGATIVE Final   Staphylococcus aureus NEGATIVE NEGATIVE Final    Comment: (NOTE) The Xpert SA Assay (FDA approved for NASAL specimens in patients 52 years of age and older), is one component of a comprehensive surveillance program. It is not intended to diagnose infection nor to guide or monitor treatment. Performed at [facility], [place] [address], [place], [place] [address]      Time coordinating discharge: 45 minutes  SIGNED:   Tawni Millers, MD  [facility] 01/29/2020, 2:01 PM

## 2020-01-29 NOTE — Progress Notes (Signed)
Occupational Therapy Treatment Patient Details Name: Nicole [name] MRN: [id number] DOB: [birth year] Today's Date: 01/29/2020    History of present illness Pt is a 54 y.o. female admitted 01/22/20 with worsening L foot infection and osteomyelitis. S/p L transtibial amputation 12/3. PMH includes COPD, HTN, DM, morbid obesity, CHF, CAD, afib on Eliquis.   OT comments  Pt making steady progress towards OT goals this session. Pt continues to present with decreased strength, impaired balance, and impaired ability to care for self impacting pts ability to complete BADLs. Pt found seated in chair needing to void bowels. Pt completed stand pivot transfer from recliner>BSC with MIN A +1 and RW. Cues for hand placement to power into standing and initial steadying assist needed to transition BUEs from sitting surface to RW. Pt sit<>stand for posterior pericare with MIN A. When pulling bed up behind pt in prep for returning to EOB  pt noted to lose balance posteriorly needing to be assisted slowly to ground. Pt reports no injuries. Lifted pt back to bed with maximove with +2 assist from PT, alerted RN. Pt would continue to benefit from skilled occupational therapy while admitted and after d/c to address the below listed limitations in order to improve overall functional mobility and facilitate independence with BADL participation. DC plan remains appropriate, will follow acutely per POC.     Follow Up Recommendations  SNF    Equipment Recommendations  3 in 1 bedside commode    Recommendations for Other Services      Precautions / Restrictions Precautions Precautions: Fall;Other (comment) Precaution Comments: L BKA wound vac, 3L O2 baseline Required Braces or Orthoses: Other Brace Other Brace: L BKA limb guard Restrictions Weight Bearing Restrictions: Yes LLE Weight Bearing: Non weight bearing       Mobility Bed Mobility Overal bed mobility: Needs Assistance Bed Mobility: Rolling Rolling:  Supervision   Supine to sit: Supervision;HOB elevated     General bed mobility comments: supervision for cues related to body mechanics  Transfers Overall transfer level: Needs assistance Equipment used: Rolling walker (2 wheeled) Transfers: Sit to/from [employer] Sit to Stand: Min assist Stand pivot transfers: Min assist      Lateral/Scoot Transfers: Min assist;+2 safety/equipment General transfer comment: pt able to stand from recliner with MIN A needing cues for hand placement and steadying assist to transition BUEs from chair to RW. pt completed stand pivot transfer from recliner>BSC with MIN A and RW, cues for heel <>toe pattern, pt completed additional stand from [facility] for pericare with pt losing balance posteriorly, assisted pt to floor with gait belt. pt reports no injuries, lifted pt back to bed via maxi move for safety with +2 assist    Balance Overall balance assessment: Needs assistance Sitting-balance support: No upper extremity supported Sitting balance-Leahy Scale: Good     Standing balance support: Bilateral upper extremity supported;During functional activity Standing balance-Leahy Scale: Poor Standing balance comment: Reliant on BUE support for static standing balance, pt with posterior LOB this session                           ADL either performed or assessed with clinical judgement   ADL Overall ADL's : Needs assistance/impaired                         Toilet Transfer: Minimal assistance;Stand-pivot;RW Toilet Transfer Details (indicate cue type and reason): pt completed stand pivot transfer from recliner>BSC with  MIN A +1, good heel toe pattern noted. Toileting- Architect and Hygiene: Sit to/from stand;Total assistance Toileting - Clothing Manipulation Details (indicate cue type and reason): total A for posterior pericare in standing     Functional mobility during ADLs: Minimal assistance;Rolling walker;+2  for safety/equipment General ADL Comments: Pt limited by decreased strength, decreased balance, and imparired ability to care for self impacting pts ability to complete BADLs     Vision       Perception     Praxis      Cognition Arousal/Alertness: Awake/alert Behavior During Therapy: WFL for tasks assessed/performed Overall Cognitive Status: Impaired/Different from baseline Area of Impairment: Problem solving                             Problem Solving: Slow processing General Comments: noted some delayed processing during session with a moment of pt becoming emotional, overall WFL for mobility tasks        Exercises     Shoulder Instructions       General Comments limb guard donned during session    Pertinent Vitals/ Pain       Pain Assessment: No/denies pain Pain Intervention(s): Monitored during session  Home Living                                          Prior Functioning/Environment              Frequency  Min 2X/week        Progress Toward Goals  OT Goals(current goals can now be found in the care plan section)  Progress towards OT goals: Progressing toward goals  Acute Rehab OT Goals Patient Stated Goal: pt wants to go to rehab OT Goal Formulation: With patient Time For Goal Achievement: 02/10/20 Potential to Achieve Goals: Good  Plan Discharge plan remains appropriate;Frequency remains appropriate    Co-evaluation                 AM-PAC OT "6 Clicks" Daily Activity     Outcome Measure   Help from another person eating meals?: None Help from another person taking care of personal grooming?: A Little Help from another person toileting, which includes using toliet, bedpan, or urinal?: A Lot Help from another person bathing (including washing, rinsing, drying)?: A Lot Help from another person to put on and taking off regular upper body clothing?: A Little Help from another person to put on and taking  off regular lower body clothing?: A Lot 6 Click Score: 16    End of Session Equipment Utilized During Treatment: Gait belt;Other (comment) (drop arm 3n1)  OT Visit Diagnosis: Unsteadiness on feet (R26.81);Muscle weakness (generalized) (M62.81);Pain Pain - Right/Left: Left Pain - part of body: Leg   Activity Tolerance Patient tolerated treatment well   Patient Left in bed;with call bell/phone within reach;with bed alarm set   Nurse Communication Mobility status        Time: 7262-0355 OT Time Calculation (min): 38 min  Charges: OT General Charges $OT Visit: 1 Visit OT Treatments $Self Care/Home Management : 38-52 mins  Audery Amel., COTA/L Acute Rehabilitation Services [phone]    Angelina Pih 01/29/2020, 4:52 PM

## 2020-01-29 NOTE — Plan of Care (Signed)
Patient is s/p left BKA on 12/3. Stump shrinker in place and elevated on pillow. Pain medication given x1 so far on my shift. Patient is on cpap to sleep and is tolerating well. IV antibx are given as ordered. Discharge plan to SNF once stable. Will continue to monitor and continue current POC.

## 2020-01-30 ENCOUNTER — Ambulatory Visit (HOSPITAL_COMMUNITY): Payer: Medicare (Managed Care) | Admitting: Physical Therapy

## 2020-01-30 LAB — CREATININE, SERUM
Creatinine, Ser: 0.73 mg/dL (ref 0.44–1.00)
GFR, Estimated: >60 mL/min (ref >60)

## 2020-01-30 LAB — GLUCOSE, CAPILLARY
Glucose-Capillary: 160 mg/dL — ABNORMAL HIGH (ref 70–99)
Glucose-Capillary: 173 mg/dL — ABNORMAL HIGH (ref 70–99)
Glucose-Capillary: 213 mg/dL — ABNORMAL HIGH (ref 70–99)
Glucose-Capillary: 256 mg/dL — ABNORMAL HIGH (ref 70–99)
Glucose-Capillary: 257 mg/dL — ABNORMAL HIGH (ref 70–99)
Glucose-Capillary: 336 mg/dL — ABNORMAL HIGH (ref 70–99)

## 2020-01-30 MED ORDER — INSULIN ASPART 100 UNIT/ML ~~LOC~~ SOLN
3.0000 [IU] | Freq: Three times a day (TID) | SUBCUTANEOUS | Status: DC
  Administered 2020-01-30: 3 [IU] via SUBCUTANEOUS

## 2020-01-30 MED ORDER — SENNOSIDES-DOCUSATE SODIUM 8.6-50 MG PO TABS: 1.0000 | ORAL_TABLET | Freq: Every evening | ORAL | Status: DC | PRN

## 2020-01-30 MED ORDER — INSULIN ASPART 100 UNIT/ML ~~LOC~~ SOLN
0.0000 [IU] | Freq: Every day | SUBCUTANEOUS | Status: DC
  Administered 2020-01-30 – 2020-01-31 (×2): 4 [IU] via SUBCUTANEOUS

## 2020-01-30 MED ORDER — INSULIN ASPART 100 UNIT/ML ~~LOC~~ SOLN
0.0000 [IU] | Freq: Three times a day (TID) | SUBCUTANEOUS | Status: DC
  Administered 2020-01-30: 2 [IU] via SUBCUTANEOUS
  Administered 2020-01-31: 5 [IU] via SUBCUTANEOUS
  Administered 2020-01-31: 9 [IU] via SUBCUTANEOUS
  Administered 2020-01-31: 5 [IU] via SUBCUTANEOUS

## 2020-01-30 NOTE — Plan of Care (Signed)
  Problem: Education: Goal: Knowledge of General Education information will improve Description: Including pain rating scale, medication(s)/side effects and non-pharmacologic comfort measures Outcome: Progressing   Problem: Nutrition: Goal: Adequate nutrition will be maintained Outcome: Progressing   Problem: Coping: Goal: Level of anxiety will decrease Outcome: Progressing   Problem: Elimination: Goal: Will not experience complications related to bowel motility Outcome: Progressing   Problem: Pain Managment: Goal: General experience of comfort will improve Outcome: Progressing   Problem: Safety: Goal: Ability to remain free from injury will improve Outcome: Progressing   

## 2020-01-30 NOTE — Progress Notes (Signed)
Inpatient Diabetes Program Recommendations  AACE/ADA: New Consensus Statement on Inpatient Glycemic Control (2015)  Target Ranges:  Prepandial:   less than 140 mg/dL      Peak postprandial:   less than 180 mg/dL (1-2 hours)      Critically ill patients:  140 - 180 mg/dL   Lab Results  Component Value Date   GLUCAP 160 (H) 01/30/2020   HGBA1C 9.6 (H) 12/25/2019    Review of Glycemic Control Results for Nicole [name], Nicole [name] (MRN [id number]) as of 01/30/2020 12:23  Ref. Range 01/29/2020 19:42 01/30/2020 00:21 01/30/2020 04:33 01/30/2020 07:39 01/30/2020 11:00  Glucose-Capillary Latest Ref Range: 70 - 99 mg/dL 159 (H) 458 (H) 592 (H) 213 (H) 160 (H)     Inpatient Diabetes Program Recommendations:  Patient is eating.  Please consider:  Novolog 0-20 TID & 0-5 QHS  Will continue to follow while inpatient.  Thank you, Dulce Sellar, RN, BSN Diabetes Coordinator Inpatient Diabetes Program [phone] (team pager from 8a-5p)

## 2020-01-30 NOTE — TOC Progression Note (Addendum)
Transition of Care [facility]) - Progression Note    Patient Details  Name: Nicole [name] MRN: [id number] Date of Birth: 03/02/1965  [facility] of [facility] CM/SW Contact  Epifanio Lesches, RN Phone Number: 01/30/2020, 8:48 AM  Clinical Narrative:    Pt with no SNF bed offers. TOC team will continue to monitor and follow....  12/82021 1500  Pt remains without SNF bed offers.NCM called admission liaisons @ [place], [facility] and [facility] to review and respond via HUB with determination. Pt wants to be close to [place].  Expected Discharge Plan: Skilled Nursing Facility Barriers to Discharge: No SNF bed  Expected Discharge Plan and Services Expected Discharge Plan: Skilled Nursing Facility         Expected Discharge Date: 01/30/20                        336-             Social Determinants of Health (SDOH) Interventions    Readmission Risk Interventions No flowsheet data found.

## 2020-01-30 NOTE — Progress Notes (Signed)
Patient is postop day six status post left transtibial amputation.  She is lying in bed awake.  She does not have any questions.  Wound VAC is in place functioning well with 0 cc in the canister.  Wound VAC was removed today.  She has well approximated wound incision with staples in place.  No bloody drainage.  No cellulitis no necrosis dry dressing was applied  Patient will need follow-up in our office in 1 week

## 2020-01-30 NOTE — Progress Notes (Signed)
RT placed patient on CPAP HS. 3L O2 bleed in needed. Patient tolerating well at this time. 

## 2020-01-30 NOTE — Progress Notes (Signed)
Patient is seen and examined at bedside, currently remains on 3 L nasal cannula which is her home supplemental oxygen level.  No complaints at this time.  Patient is stable to be discharged to SNF when bed becomes available. I have answered all the questions for her.  Vital signs overall remained stable.  Call with further questions as needed  Time spent-10 minutes  Stephania Fragmin MD [facility]

## 2020-01-30 NOTE — Plan of Care (Signed)
No acute events since the previous night that I took care of her. Patient did have a fall on day shift while working with OT - patient states she is fine. Discharge plan is SNF - placement pending from progress notes. Will continue to monitor and continue current POC.

## 2020-01-30 NOTE — Progress Notes (Signed)
Pharmacy Antibiotic Note  Nicole [name] is a 54 y.o. female admitted on 01/22/2020 with L foot cellulitis/osteomyelitis s/p amputation 12/3. Patient was receiving Zosyn and Vancomycin which were stopped 24 hrs after BKA on 12/3. On 12/5, ortho recommended to continue antibiotics due to post op infection.  Pharmacy has been consulted for vancomycin dosing.   ClCr >100 ml/min. Per ortho, no signs of infection at amputation site. Patient medically ready for discharge pending placement. Discussed abx duration with ortho, ok to discontinue.   Plan: Stop antibiotics  Height: 5\' 3"  (160 cm) Weight: 131.5 kg (290 lb) IBW/kg (Calculated) : 52.4  Temp (24hrs), Avg:98 F (36.7 C), Min:97.7 F (36.5 C), Max:98.5 F (36.9 C)  Recent Labs  Lab 01/24/20 0522 01/25/20 14/03/21 01/26/20 0239 01/27/20 0244 01/28/20 0818 01/29/20 0359 01/30/20 0533  WBC 15.4* 17.5* 19.0* 15.2*  --  14.2*  --   CREATININE 0.87 0.89 0.85  --  0.74  --  0.73    Estimated Creatinine Clearance: 105.4 mL/min (by C-G formula based on SCr of 0.73 mg/dL).    Allergies  Allergen Reactions  . Aspirin Anaphylaxis and Other (See Comments)    Throat closing   . Bee Pollen Anaphylaxis  . Bee Venom Anaphylaxis  . Food Anaphylaxis    PINEAPPLE  . Pineapple Anaphylaxis  . E-Mycin [Erythromycin Base] Nausea And Vomiting  . Other Rash    Antimicrobials this admission: Vancomycin 11/30 >>  12/8 CTX 12/5 >> 12/8 Zosyn 11/30 >> 12/4  Dose adjustments this admission: N/a  Microbiology results: 11/30 bcx: ngf  Thank you for allowing pharmacy to be a part of this patient's care.  12/30, PharmD, BCPS, BCCP Clinical Pharmacist  Please check AMION for all [facility] Pharmacy phone numbers After 10:00 PM, call Main Pharmacy [phone]

## 2020-01-31 LAB — GLUCOSE, CAPILLARY
Glucose-Capillary: 264 mg/dL — ABNORMAL HIGH (ref 70–99)
Glucose-Capillary: 290 mg/dL — ABNORMAL HIGH (ref 70–99)
Glucose-Capillary: 353 mg/dL — ABNORMAL HIGH (ref 70–99)
Glucose-Capillary: 324 mg/dL — ABNORMAL HIGH (ref 70–99)

## 2020-01-31 MED ORDER — INSULIN ASPART 100 UNIT/ML ~~LOC~~ SOLN
5.0000 [IU] | Freq: Three times a day (TID) | SUBCUTANEOUS | Status: DC
  Administered 2020-01-31 (×3): 5 [IU] via SUBCUTANEOUS

## 2020-01-31 MED ORDER — INSULIN GLARGINE 100 UNIT/ML ~~LOC~~ SOLN
25.0000 [IU] | Freq: Two times a day (BID) | SUBCUTANEOUS | Status: DC
  Administered 2020-01-31 (×2): 25 [IU] via SUBCUTANEOUS
  Filled 2020-01-31 (×4): qty 0.25

## 2020-01-31 NOTE — Plan of Care (Signed)
Patient alert and oriented x4. In no acute distress. Left stump dressing intact. Problem: Education: Goal: Knowledge of General Education information will improve Description: Including pain rating scale, medication(s)/side effects and non-pharmacologic comfort measures Outcome: Progressing   Problem: Health Behavior/Discharge Planning: Goal: Ability to manage health-related needs will improve Outcome: Progressing   Problem: Clinical Measurements: Goal: Ability to maintain clinical measurements within normal limits will improve Outcome: Progressing Goal: Will remain free from infection Outcome: Progressing Goal: Diagnostic test results will improve Outcome: Progressing Goal: Respiratory complications will improve Outcome: Progressing Goal: Cardiovascular complication will be avoided Outcome: Progressing   Problem: Activity: Goal: Risk for activity intolerance will decrease Outcome: Progressing   Problem: Nutrition: Goal: Adequate nutrition will be maintained Outcome: Progressing   Problem: Coping: Goal: Level of anxiety will decrease Outcome: Progressing   Problem: Elimination: Goal: Will not experience complications related to bowel motility Outcome: Progressing Goal: Will not experience complications related to urinary retention Outcome: Progressing   Problem: Pain Managment: Goal: General experience of comfort will improve Outcome: Progressing   Problem: Safety: Goal: Ability to remain free from injury will improve Outcome: Progressing   Problem: Skin Integrity: Goal: Risk for impaired skin integrity will decrease Outcome: Progressing

## 2020-01-31 NOTE — Progress Notes (Signed)
Physical Therapy Treatment Patient Details Name: Nicole [name] MRN: [id number] DOB: 10/26/1965 Today's Date: 01/31/2020    History of Present Illness Pt is a 54 y.o. female admitted 01/22/20 with worsening L foot infection and osteomyelitis. S/p L transtibial amputation 12/3. PMH includes COPD, HTN, DM, morbid obesity, CHF, CAD, afib on Eliquis.   PT Comments    Pt progressing well with mobility. Tolerated continued transfer training with lateral scoots and standing to RW with modA. Pt remains limited by generalized weakness, decreased activity tolerance and impaired balance strategies/postural reactions; at high risk for falls. Continue to recommend SNF-level therapies to maximize functional mobility and independence.    Follow Up Recommendations  SNF;Supervision for mobility/OOB     Equipment Recommendations  Rolling walker with 5" wheels;3in1 (PT);Wheelchair (measurements PT);Wheelchair cushion (measurements PT)    Recommendations for Other Services       Precautions / Restrictions Precautions Precautions: Fall;Other (comment) Precaution Comments: 3L O2 baseline Required Braces or Orthoses: Other Brace Other Brace: L BKA limb guard Restrictions Weight Bearing Restrictions: Yes LLE Weight Bearing: Non weight bearing    Mobility  Bed Mobility Overal bed mobility: Needs Assistance Bed Mobility: Supine to Sit     Supine to sit: Supervision;HOB elevated     General bed mobility comments: Pt reliant on momentum to power into sitting EOB with use of bed rail, DOE 3/4 with this, cued pt to not hold breath and take movement slower  Transfers Overall transfer level: Needs assistance Equipment used: Rolling walker (2 wheeled) Transfers: Sit to/from Stand;Lateral/Scoot Transfers Sit to Stand: Mod assist        Lateral/Scoot Transfers: Min assist General transfer comment: Performed lateral scoot from bed to drop arm recliner towards R-side, pt performing well with cues for  technique and minA for stability; once in recliner, performed sit<>stand with RW, pt requiring modA for trunk elevation and stability, cues for sequencing; pt endorses increased fear secondary to "I'm not used to my left foot not being there"  Ambulation/Gait                 Stairs             Wheelchair Mobility    Modified Rankin (Stroke Patients Only)       Balance Overall balance assessment: Needs assistance Sitting-balance support: No upper extremity supported Sitting balance-Leahy Scale: Good Sitting balance - Comments: Demonstrates good core control with dynamic sitting balance   Standing balance support: Bilateral upper extremity supported;During functional activity Standing balance-Leahy Scale: Poor Standing balance comment: Reliant on BUE support for static standing balance                            Cognition Arousal/Alertness: Awake/alert Behavior During Therapy: WFL for tasks assessed/performed Overall Cognitive Status: Impaired/Different from baseline Area of Impairment: Problem solving;Awareness                           Awareness: Emergent Problem Solving: Slow processing;Requires verbal cues General Comments: noted some delayed processing during session      Exercises      General Comments General comments (skin integrity, edema, etc.): Pt reports not wearing limb guard since staff removed it yesterday, left resting in extension; limb guard donned and educ on importance of wearing it majority of the day; pt still demonstrates good L knee extension      Pertinent Vitals/Pain Pain Assessment: Faces Faces Pain  Scale: Hurts a little bit Pain Location: Posterior knee Pain Descriptors / Indicators: Discomfort;Sore Pain Intervention(s): Monitored during session;Repositioned    Home Living                      Prior Function            PT Goals (current goals can now be found in the care plan section)  Progress towards PT goals: Progressing toward goals    Frequency    Min 3X/week      PT Plan Current plan remains appropriate    Co-evaluation              AM-PAC PT "6 Clicks" Mobility   Outcome Measure  Help needed turning from your back to your side while in a flat bed without using bedrails?: None Help needed moving from lying on your back to sitting on the side of a flat bed without using bedrails?: A Little Help needed moving to and from a bed to a chair (including a wheelchair)?: A Little Help needed standing up from a chair using your arms (e.g., wheelchair or bedside chair)?: A Lot Help needed to walk in hospital room?: Total Help needed climbing 3-5 steps with a railing? : Total 6 Click Score: 14    End of Session Equipment Utilized During Treatment: Gait belt Activity Tolerance: Patient tolerated treatment well Patient left: in chair;with call bell/phone within reach;with chair alarm set Nurse Communication: Mobility status;Other (comment) (PT to be present for transfer back to bed to help instil confidence in pt with standing transfers) PT Visit Diagnosis: Unsteadiness on feet (R26.81);Other abnormalities of gait and mobility (R26.89);Repeated falls (R29.6);Muscle weakness (generalized) (M62.81)     Time: 8144-8185 PT Time Calculation (min) (ACUTE ONLY): 27 min  Charges:  $Therapeutic Activity: 23-37 mins                     Nicole [name], PT, DPT Acute Rehabilitation Services  Pager [phone] Office [phone]  Malachy Chamber 01/31/2020, 3:08 PM

## 2020-01-31 NOTE — Progress Notes (Signed)
Physical Therapy Treatment Patient Details Name: Nicole [name] MRN: [id number] DOB: 21-Mar-1965 Today's Date: 01/31/2020    History of Present Illness Pt is a 54 y.o. female admitted 01/22/20 with worsening L foot infection and osteomyelitis. S/p L transtibial amputation 12/3. PMH includes COPD, HTN, DM, morbid obesity, CHF, CAD, afib on Eliquis.   PT Comments    Pt seen for additional session. Improving tolerance to transfer training, able to try stand pivot transfer from recliner to bed, requiring modA for UE support and balance. Increased time educ re: current condition with L BKA, including phantom limb pain, limb guard wear and importance of mobility. Pt remains motivated to participate and d/c with post-acute rehab services.  DOE 3/4 with activity, SpO2 96% on 3L O2    Follow Up Recommendations  SNF;Supervision for mobility/OOB     Equipment Recommendations  Rolling walker with 5" wheels;3in1 (PT);Wheelchair (measurements PT);Wheelchair cushion (measurements PT)    Recommendations for Other Services       Precautions / Restrictions Precautions Precautions: Fall;Other (comment) Precaution Comments: 3L O2 baseline Required Braces or Orthoses: Other Brace Other Brace: L BKA limb guard Restrictions Weight Bearing Restrictions: Yes LLE Weight Bearing: Non weight bearing    Mobility  Bed Mobility Overal bed mobility: Needs Assistance Bed Mobility: Sit to Supine     Supine to sit: Supervision;HOB elevated Sit to supine: Supervision   General bed mobility comments: Pt reliant on momentum for bed mobility and repositioning; increased SOB and fatigue with this  Transfers Overall transfer level: Needs assistance Equipment used: 1 person hand held assist Transfers: Stand Pivot Transfers Sit to Stand: Mod assist Stand pivot transfers: Mod assist      Lateral/Scoot Transfers: Min assist General transfer comment: ModA for RUE HHA (via elbow hook) to elevate trunk and  maintain balance to pivot from recliner to bed; educ on sequencing and safe technique  Ambulation/Gait                 Stairs             Wheelchair Mobility    Modified Rankin (Stroke Patients Only)       Balance Overall balance assessment: Needs assistance Sitting-balance support: No upper extremity supported Sitting balance-Leahy Scale: Good Sitting balance - Comments: Demonstrates good core control with dynamic sitting balance   Standing balance support: Bilateral upper extremity supported;During functional activity Standing balance-Leahy Scale: Poor Standing balance comment: Reliant on BUE support for static standing balance                            Cognition Arousal/Alertness: Awake/alert Behavior During Therapy: WFL for tasks assessed/performed Overall Cognitive Status: Impaired/Different from baseline Area of Impairment: Problem solving;Awareness                           Awareness: Emergent Problem Solving: Slow processing;Requires verbal cues General Comments: noted some delayed processing during session but difficult to determine if true cognitive impairment      Exercises Amputee Exercises Hip Flexion/Marching: AROM;Left;Seated    General Comments General comments (skin integrity, edema, etc.): DOE 3/4 with activity; SpO2 96% on 3L O2. L BKA limb guard readjusted. Increased time discussing various transfer options/techniques; educ on phantom limb pain. Pt emotional regarding loss of L foot, responding well to supportive listening and education      Pertinent Vitals/Pain Pain Assessment: Faces Faces Pain Scale: Hurts a little bit  Pain Location: Posterior L knee Pain Descriptors / Indicators: Discomfort;Sore Pain Intervention(s): Monitored during session;Repositioned    Home Living                      Prior Function            PT Goals (current goals can now be found in the care plan section) Progress  towards PT goals: Progressing toward goals    Frequency    Min 3X/week      PT Plan Current plan remains appropriate    Co-evaluation              AM-PAC PT "6 Clicks" Mobility   Outcome Measure  Help needed turning from your back to your side while in a flat bed without using bedrails?: None Help needed moving from lying on your back to sitting on the side of a flat bed without using bedrails?: A Little Help needed moving to and from a bed to a chair (including a wheelchair)?: A Little Help needed standing up from a chair using your arms (e.g., wheelchair or bedside chair)?: A Lot Help needed to walk in hospital room?: Total Help needed climbing 3-5 steps with a railing? : Total 6 Click Score: 14    End of Session Equipment Utilized During Treatment: Gait belt Activity Tolerance: Patient tolerated treatment well Patient left: in bed;with call bell/phone within reach;with nursing/sitter in room Nurse Communication: Mobility status PT Visit Diagnosis: Unsteadiness on feet (R26.81);Other abnormalities of gait and mobility (R26.89);Repeated falls (R29.6);Muscle weakness (generalized) (M62.81)     Time: 6015-6153 PT Time Calculation (min) (ACUTE ONLY): 16 min  Charges:  $Therapeutic Activity: 8-22 mins                     Ina Homes, PT, DPT Acute Rehabilitation Services  Pager [phone] Office [phone]  Malachy Chamber 01/31/2020, 5:24 PM

## 2020-01-31 NOTE — Progress Notes (Signed)
Seen and examined at bedside, no complaints.  No acute events overnight.  Vital signs overall remained stable, she is still on 3 L nasal cannula.  3 L oxygen is her home regimen. She is requesting that she wants Foley catheter to be placed because she has been urinating frequently.  I explained her that there is no reason for Foley catheter but we will BladderScan her and place external female catheter unless she continues to have urinary retention.  Insulin regimen adjusted.  Time spent-15 minutes  Stephania Fragmin MD [facility]

## 2020-02-01 LAB — MAGNESIUM: Magnesium: 1.6 mg/dL — ABNORMAL LOW (ref 1.7–2.4)

## 2020-02-01 LAB — BASIC METABOLIC PANEL
Anion gap: 10 (ref 5–15)
BUN: 14 mg/dL (ref 6–20)
CO2: 30 mmol/L (ref 22–32)
Calcium: 9 mg/dL (ref 8.9–10.3)
Chloride: 100 mmol/L (ref 98–111)
Creatinine, Ser: 0.73 mg/dL (ref 0.44–1.00)
GFR, Estimated: >60 mL/min (ref >60)
Glucose, Bld: 228 mg/dL — ABNORMAL HIGH (ref 70–99)
Potassium: 4.3 mmol/L (ref 3.5–5.1)
Sodium: 140 mmol/L (ref 135–145)

## 2020-02-01 LAB — GLUCOSE, CAPILLARY
Glucose-Capillary: 174 mg/dL — ABNORMAL HIGH (ref 70–99)
Glucose-Capillary: 189 mg/dL — ABNORMAL HIGH (ref 70–99)
Glucose-Capillary: 220 mg/dL — ABNORMAL HIGH (ref 70–99)
Glucose-Capillary: 192 mg/dL — ABNORMAL HIGH (ref 70–99)

## 2020-02-01 MED ORDER — INSULIN ASPART 100 UNIT/ML ~~LOC~~ SOLN
8.0000 [IU] | Freq: Three times a day (TID) | SUBCUTANEOUS | Status: DC
  Administered 2020-02-01 – 2020-02-06 (×15): 8 [IU] via SUBCUTANEOUS

## 2020-02-01 MED ORDER — INSULIN ASPART 100 UNIT/ML ~~LOC~~ SOLN
0.0000 [IU] | Freq: Every day | SUBCUTANEOUS | Status: DC
  Administered 2020-02-02: 2 [IU] via SUBCUTANEOUS

## 2020-02-01 MED ORDER — INSULIN GLARGINE 100 UNIT/ML ~~LOC~~ SOLN
32.0000 [IU] | Freq: Two times a day (BID) | SUBCUTANEOUS | Status: DC
  Administered 2020-02-01 (×2): 32 [IU] via SUBCUTANEOUS
  Filled 2020-02-01 (×4): qty 0.32

## 2020-02-01 MED ORDER — MAGNESIUM SULFATE 4 GM/100ML IV SOLN
4.0000 g | Freq: Once | INTRAVENOUS | Status: AC
  Administered 2020-02-01: 4 g via INTRAVENOUS
  Filled 2020-02-01: qty 100

## 2020-02-01 MED ORDER — INSULIN ASPART 100 UNIT/ML ~~LOC~~ SOLN
0.0000 [IU] | Freq: Three times a day (TID) | SUBCUTANEOUS | Status: DC
  Administered 2020-02-01: 3 [IU] via SUBCUTANEOUS
  Administered 2020-02-01: 5 [IU] via SUBCUTANEOUS
  Administered 2020-02-02 (×2): 3 [IU] via SUBCUTANEOUS
  Administered 2020-02-02: 5 [IU] via SUBCUTANEOUS
  Administered 2020-02-03: 3 [IU] via SUBCUTANEOUS
  Administered 2020-02-03: 5 [IU] via SUBCUTANEOUS
  Administered 2020-02-03: 3 [IU] via SUBCUTANEOUS
  Administered 2020-02-04: 2 [IU] via SUBCUTANEOUS
  Administered 2020-02-04 (×2): 3 [IU] via SUBCUTANEOUS
  Administered 2020-02-05: 5 [IU] via SUBCUTANEOUS
  Administered 2020-02-05: 2 [IU] via SUBCUTANEOUS
  Administered 2020-02-06: 3 [IU] via SUBCUTANEOUS
  Administered 2020-02-06: 2 [IU] via SUBCUTANEOUS

## 2020-02-01 NOTE — Plan of Care (Signed)
  Problem: Safety: Goal: Ability to remain free from injury will improve Outcome: Progressing   

## 2020-02-01 NOTE — TOC Progression Note (Signed)
Transition of Care [facility]) - Progression Note    Patient Details  Name: Nicole [name] MRN: [id number] Date of Birth: [birth year]  Transition of Care [facility]) CM/SW Contact  Epifanio Lesches, RN Phone Number: 02/01/2020, 10:06 AM  Clinical Narrative:    [facility] bed offer and insurance authorization pending, authorization initiated by SNF.  TOC team will continue monitor and follow ....  Expected Discharge Plan: Skilled Nursing Facility Barriers to Discharge: No SNF bed,Insurance Authorization  Expected Discharge Plan and Services Expected Discharge Plan: Skilled Nursing Facility         Expected Discharge Date: 01/30/20                                     Social Determinants of Health (SDOH) Interventions    Readmission Risk Interventions No flowsheet data found.

## 2020-02-01 NOTE — Progress Notes (Signed)
Physical Therapy Treatment Patient Details Name: Nicole [name] MRN: [id number] DOB: 1966-02-16 Today's Date: 02/01/2020    History of Present Illness Pt is a 54 y.o. female admitted 01/22/20 with worsening L foot infection and osteomyelitis. S/p L transtibial amputation 12/3. PMH includes COPD, HTN, DM, morbid obesity, CHF, CAD, afib on Eliquis.   PT Comments    Pt seen for additional session for transfer training and seated ADL tasks. Pt remains limited by generalized weakness, decreased activity tolerance and impaired balance. Remains motivated to participate and regain PLOF. Continue to recommend SNF-level therapies to maximize functional mobility and independence prior to return home.   Follow Up Recommendations  SNF;Supervision for mobility/OOB     Equipment Recommendations  Rolling walker with 5" wheels;3in1 (PT);Wheelchair (measurements PT);Wheelchair cushion (measurements PT)    Recommendations for Other Services       Precautions / Restrictions Precautions Precautions: Fall;Other (comment) Precaution Comments: 3L O2 baseline Required Braces or Orthoses: Other Brace Other Brace: L BKA limb guard Restrictions Weight Bearing Restrictions: Yes LLE Weight Bearing: Non weight bearing    Mobility  Bed Mobility Overal bed mobility: Needs Assistance         Sit to supine: Modified independent (Device/Increase time);HOB elevated   General bed mobility comments:  Transfers Overall transfer level: Needs assistance Equipment used: 1 person hand held assist Transfers: Stand Pivot Transfers Sit to Stand: Mod assist Stand pivot transfers: Mod assist       General transfer comment: Multiple stand pivot transfers from recliner to [facility] to bed (all moving towards R-side) with consistent modA for UE support and balance  Ambulation/Gait    Stairs             Wheelchair Mobility    Modified Rankin (Stroke Patients Only)       Balance Overall balance  assessment: Needs assistance Sitting-balance support: No upper extremity supported Sitting balance-Leahy Scale: Good Sitting balance - Comments: Demonstrates good core control with dynamic sitting balance; required assist for posterior pericare while sitting on BSC, but able to offload well from seat to allow this   Standing balance support: Bilateral upper extremity supported;During functional activity Standing balance-Leahy Scale: Poor Standing balance comment: Reliant on BUE support for static standing balance                            Cognition Arousal/Alertness: Awake/alert Behavior During Therapy: WFL for tasks assessed/performed Overall Cognitive Status: Impaired/Different from baseline Area of Impairment: Problem solving;Awareness                           Awareness: Emergent Problem Solving: Slow processing;Requires verbal cues General Comments: improving carryover with cues for safety and hand placement during transitional movements      Exercises      General Comments General comments (skin integrity, edema, etc.): Required assist to don limb guard this session; SpO2 >/98% on 3L      Pertinent Vitals/Pain Pain Assessment: No/denies pain Pain Intervention(s): Monitored during session    Home Living                      Prior Function            PT Goals (current goals can now be found in the care plan section) Acute Rehab PT Goals Patient Stated Goal: To get prosthetic and walk again PT Goal Formulation: With patient Time For Goal  Achievement: 02/15/20 Potential to Achieve Goals: Good Progress towards PT goals: Progressing toward goals    Frequency    Min 3X/week      PT Plan Current plan remains appropriate    Co-evaluation PT/OT/SLP Co-Evaluation/Treatment:  (overlap)            AM-PAC PT "6 Clicks" Mobility   Outcome Measure  Help needed turning from your back to your side while in a flat bed without  using bedrails?: None Help needed moving from lying on your back to sitting on the side of a flat bed without using bedrails?: A Little Help needed moving to and from a bed to a chair (including a wheelchair)?: A Little Help needed standing up from a chair using your arms (e.g., wheelchair or bedside chair)?: A Lot Help needed to walk in hospital room?: A Lot Help needed climbing 3-5 steps with a railing? : Total 6 Click Score: 15    End of Session Equipment Utilized During Treatment: Gait belt Activity Tolerance: Patient tolerated treatment well;Patient limited by fatigue Patient left: in bed;with call bell/phone within reach;with bed alarm set Nurse Communication: Mobility status PT Visit Diagnosis: Unsteadiness on feet (R26.81);Other abnormalities of gait and mobility (R26.89);Repeated falls (R29.6);Muscle weakness (generalized) (M62.81)     Time: 1531-1601 PT Time Calculation (min) (ACUTE ONLY): 30 min  Charges:  $Therapeutic Activity: 23-37 mins                    Nicole [name], PT, DPT Acute Rehabilitation Services  Pager [phone] Office [phone]  Malachy Chamber 02/01/2020, 5:25 PM

## 2020-02-01 NOTE — Plan of Care (Signed)

## 2020-02-01 NOTE — Progress Notes (Signed)
PROGRESS NOTE    Nicole [name]  WEX:[id number] DOB: [birth year] DOA: 01/22/2020 PCP: [name], MD   Brief Narrative:  54 year old female with past medical history for COPD, dyslipidemia, hypertension, obesity class III, type 2 diabetes mellitus, coronary artery disease, diastolic heart failure, atrial fibrillation, and obstructive sleep apnea who presents with left foot pain.  She had a recent hospitalization 10/13-10/18 for left diabetic foot infection/cellulitis, received antibiotic therapy, and was discharged home with home health services.  Left foot radiograph with extensive soft tissue edema and subcutaneous emphysema, throughout the left foot compatible with infection.  Cortical irregularity lateral margin of the distal tuft first distal phalanx consistent with osteomyelitis.Patient was placed on broad-spectrum antibiotic therapy, orthopedics was consulted, and patient was transferred to [facility] for further surgical orthopedic intervention. Patient underwent left transtibial amputation on 12/03.    She continue to be very weak and deconditioned, plan to transfer to SNF.    Assessment & Plan:   Principal Problem:   Sepsis due to Lt foot osteo Active Problems:   Coronary atherosclerosis of native coronary artery/Stent in 2006   Diabetes mellitus type 2 in obese (HCC)   HTN (hypertension), benign   COPD (chronic obstructive pulmonary disease) (HCC)   Diabetic ulcer of left foot (HCC)   Cellulitis of left foot   Osteomyelitis of left foot (HCC)   Hypokalemia   Hypoalbuminemia   Hyperglycemia due to diabetes mellitus (HCC)   Morbid obesity with body mass index (BMI) of 50.0 to 59.9 in adult [facility])   Tobacco abuse--2 P/day Smoker   Acute osteomyelitis of toe, left (HCC)     1.  Left foot osteomyelitis, diabetic foot infection, complicated with sepsis, present on admission.    Patient underwent left transtibial amputation on 12/3.  PT recommended  SNF   Patient has a wound VAC in place, will follow-up with surgery as an outpatient.   2.  Hypertension/coronary artery disease.  Continue blood pressure control with metoprolol and lisinopril. At discharge will resume clopidogrel and furosemide.    3.  Uncontrolled type II diabetes mellitus, dyslipidemia.    Continue Lipitor.  Poorly controlled diabetes.  Will increase Lantus to 32 units twice daily, Premeal NovoLog to 8 units.  Continue sliding scale.   4.  Hypokalemia, hypomagnesemia, metabolic alkalosis.    Replete as necessary   5.  Obesity class III, severe sleep apnea.  Calculated BMI 51.3.  Continue CPAP at night and napping. Continue physical therapy.   6.  Chronic atrial fibrillation.  Continue rate control metoprolol, anticoagulation with apixaban.   7.  COPD, tobacco abuse.  No signs of acute exacerbation, continue Dulera.  Smoking cessation.   8.  Depression/insomnia.  Continue duloxetine and bupropion.  Trazodone was added with good toleration.    DVT prophylaxis: SCDs Start: 01/25/20 1232 SCDs Start: 01/22/20 2023 apixaban (ELIQUIS) tablet 5 mg  Code Status: Full code Family Communication:    Status is: Inpatient  Remains inpatient appropriate because:Unsafe d/c plan   Dispo: The patient is from: Home              Anticipated d/c is to: SNF              Anticipated d/c date is: 1 day              Patient currently is medically stable to d/c.  Pending SNF bed       Body mass index is 51.37 kg/m.  Subjective: Doing okay no complaints.  No acute events overnight.   Examination:  Constitutional: Not in acute distress, 3 L nasal cannula Respiratory: Clear to auscultation bilaterally Cardiovascular: Normal sinus rhythm, no rubs Abdomen: Nontender nondistended good bowel sounds Musculoskeletal: No edema noted Skin: No rashes seen Neurologic: CN 2-12 grossly intact.  And nonfocal Psychiatric: Normal judgment and insight. Alert and oriented  x 3. Normal mood.    Objective: Vitals:   01/31/20 1633 01/31/20 1954 01/31/20 2246 02/01/20 0551  BP: (!) 142/77 133/67  (!) 147/76  Pulse: 99 95 96 93  Resp: 18 19 18 15   Temp: 97.8 F (36.6 C) 98.6 F (37 C)  97.9 F (36.6 C)  TempSrc: Oral Oral  Oral  SpO2: 99% 100% 96% 99%  Weight:      Height:        Intake/Output Summary (Last 24 hours) at 02/01/2020 0809 Last data filed at 01/31/2020 2300 Gross per 24 hour  Intake 720 ml  Output 560 ml  Net 160 ml   Filed Weights   01/22/20 1508  Weight: 131.5 kg     Data Reviewed:   CBC: Recent Labs  Lab 01/26/20 0239 01/27/20 0244 01/29/20 0359  WBC 19.0* 15.2* 14.2*  NEUTROABS 14.2* 11.0* 9.7*  HGB 11.0* 10.4* 10.3*  HCT 36.5 33.3* 34.0*  MCV 98.1 96.5 96.0  PLT 356 363 347   Basic Metabolic Panel: Recent Labs  Lab 01/26/20 0239 01/28/20 0818 01/30/20 0533 02/01/20 0413  NA 143 140  --  140  K 4.5 3.9  --  4.3  CL 100 98  --  100  CO2 28 32  --  30  GLUCOSE 227* 255*  --  228*  BUN 19 14  --  14  CREATININE 0.85 0.74 0.73 0.73  CALCIUM 9.6 9.5  --  9.0  MG  --   --   --  1.6*   GFR: Estimated Creatinine Clearance: 105.4 mL/min (by C-G formula based on SCr of 0.73 mg/dL). Liver Function Tests: No results for input(s): AST, ALT, ALKPHOS, BILITOT, PROT, ALBUMIN in the last 168 hours. No results for input(s): LIPASE, AMYLASE in the last 168 hours. No results for input(s): AMMONIA in the last 168 hours. Coagulation Profile: No results for input(s): INR, PROTIME in the last 168 hours. Cardiac Enzymes: No results for input(s): CKTOTAL, CKMB, CKMBINDEX, TROPONINI in the last 168 hours. BNP (last 3 results) No results for input(s): PROBNP in the last 8760 hours. HbA1C: No results for input(s): HGBA1C in the last 72 hours. CBG: Recent Labs  Lab 01/31/20 0657 01/31/20 1207 01/31/20 1701 01/31/20 2139 02/01/20 0550  GLUCAP 264* 290* 353* 324* 174*   Lipid Profile: No results for input(s): CHOL,  HDL, LDLCALC, TRIG, CHOLHDL, LDLDIRECT in the last 72 hours. Thyroid Function Tests: No results for input(s): TSH, T4TOTAL, FREET4, T3FREE, THYROIDAB in the last 72 hours. Anemia Panel: No results for input(s): VITAMINB12, FOLATE, FERRITIN, TIBC, IRON, RETICCTPCT in the last 72 hours. Sepsis Labs: No results for input(s): PROCALCITON, LATICACIDVEN in the last 168 hours.  Recent Results (from the past 240 hour(s))  Blood culture (routine x 2)     Status: None   Collection Time: 01/22/20  5:01 PM   Specimen: BLOOD LEFT FOREARM  Result Value Ref Range Status   Specimen Description BLOOD LEFT FOREARM  Final   Special Requests   Final    BOTTLES DRAWN AEROBIC AND ANAEROBIC Blood Culture adequate volume   Culture   Final  NO GROWTH 5 DAYS Performed at [facility], [address], [place], [place] [address]    Report Status 01/27/2020 FINAL  Final  Blood culture (routine x 2)     Status: None   Collection Time: 01/22/20  5:24 PM   Specimen: BLOOD RIGHT FOREARM  Result Value Ref Range Status   Specimen Description BLOOD RIGHT FOREARM  Final   Special Requests   Final    BOTTLES DRAWN AEROBIC AND ANAEROBIC Blood Culture adequate volume   Culture   Final    NO GROWTH 5 DAYS Performed at [facility], [address] [place], [place] [address]    Report Status 01/27/2020 FINAL  Final  Resp Panel by RT-PCR (Flu A&B, Covid) Nasopharyngeal Swab     Status: None   Collection Time: 01/22/20  5:24 PM   Specimen: Nasopharyngeal Swab; Nasopharyngeal(NP) swabs in vial transport medium  Result Value Ref Range Status   SARS Coronavirus 2 by RT PCR NEGATIVE NEGATIVE Final    Comment: (NOTE) SARS-CoV-2 target nucleic acids are NOT DETECTED.  The SARS-CoV-2 RNA is generally detectable in upper respiratory specimens during the acute phase of infection. The lowest concentration of SARS-CoV-2 viral copies this assay can detect is 138 copies/mL. A negative result does not preclude  SARS-Cov-2 infection and should not be used as the sole basis for treatment or other patient management decisions. A negative result may occur with  improper specimen collection/handling, submission of specimen other than nasopharyngeal swab, presence of viral mutation(s) within the areas targeted by this assay, and inadequate number of viral copies(<138 copies/mL). A negative result must be combined with clinical observations, patient history, and epidemiological information. The expected result is Negative.  Fact Sheet for Patients:  BloggerCourse.com  Fact Sheet for Healthcare Providers:  SeriousBroker.it  This test is no t yet approved or cleared by the [place] FDA and  has been authorized for detection and/or diagnosis of SARS-CoV-2 by FDA under an Emergency Use Authorization (EUA). This EUA will remain  in effect (meaning this test can be used) for the duration of the COVID-19 declaration under Section 564(b)(1) of the Act, 21 U.S.C.section 360bbb-3(b)(1), unless the authorization is terminated  or revoked sooner.       Influenza A by PCR NEGATIVE NEGATIVE Final   Influenza B by PCR NEGATIVE NEGATIVE Final    Comment: (NOTE) The Xpert Xpress SARS-CoV-2/FLU/RSV plus assay is intended as an aid in the diagnosis of influenza from Nasopharyngeal swab specimens and should not be used as a sole basis for treatment. Nasal washings and aspirates are unacceptable for Xpert Xpress SARS-CoV-2/FLU/RSV testing.  Fact Sheet for Patients: BloggerCourse.com  Fact Sheet for Healthcare Providers: SeriousBroker.it  This test is not yet approved or cleared by the [place] FDA and has been authorized for detection and/or diagnosis of SARS-CoV-2 by FDA under an Emergency Use Authorization (EUA). This EUA will remain in effect (meaning this test can be used) for the duration of  the COVID-19 declaration under Section 564(b)(1) of the Act, 21 U.S.C. section 360bbb-3(b)(1), unless the authorization is terminated or revoked.  Performed at [facility], [address] [place], [place] [address]   Surgical pcr screen     Status: None   Collection Time: 01/25/20 12:06 AM   Specimen: Nasal Mucosa; Nasal Swab  Result Value Ref Range Status   MRSA, PCR NEGATIVE NEGATIVE Final   Staphylococcus aureus NEGATIVE NEGATIVE Final    Comment: (NOTE) The Xpert SA Assay (FDA approved for NASAL specimens  in patients 44 years of age and older), is one component of a comprehensive surveillance program. It is not intended to diagnose infection nor to guide or monitor treatment. Performed at [facility], [address], [place], [place] [address]          Radiology Studies: No results found.      Scheduled Meds:  acetaminophen  500 mg Oral Q6H   apixaban  5 mg Oral BID   atorvastatin  40 mg Oral Daily   buPROPion  100 mg Oral BID   celecoxib  200 mg Oral BID   docusate sodium  100 mg Oral BID   DULoxetine  30 mg Oral BID   feeding supplement (GLUCERNA SHAKE)  237 mL Oral BID   insulin aspart  0-5 Units Subcutaneous QHS   insulin aspart  0-9 Units Subcutaneous TID WC   insulin aspart  8 Units Subcutaneous TID WC   insulin glargine  32 Units Subcutaneous BID   lisinopril  2.5 mg Oral Daily   metoprolol tartrate  50 mg Oral BID   mometasone-formoterol  2 puff Inhalation BID   nicotine  21 mg Transdermal Daily   pantoprazole  40 mg Oral Daily   traZODone  50 mg Oral QHS   Continuous Infusions:  lactated ringers 10 mL/hr at 01/25/20 0945   magnesium sulfate bolus IVPB       LOS: 10 days   Time spent= 20 mins    Paul Torpey Joline Maxcy, MD [facility]  If 7PM-7AM, please contact night-coverage  02/01/2020, 8:09 AM

## 2020-02-01 NOTE — Progress Notes (Signed)
Physical Therapy Treatment Patient Details Name: Nicole [name] MRN: [id number] DOB: September 19, 1965 Today's Date: 02/01/2020    History of Present Illness Pt is a 54 y.o. female admitted 01/22/20 with worsening L foot infection and osteomyelitis. S/p L transtibial amputation 12/3. PMH includes COPD, HTN, DM, morbid obesity, CHF, CAD, afib on Eliquis.   PT Comments    Pt progressing well with mobility. Overlapped session with OT to trial gait training with RW, pt able to take a few hops on RLE with RW and modA+2. Pt quick to fatigue requiring seated rest break to recover; DOE 3/4 with activity, SpO2 99-100% on 3L O2 [place]. Pt able to practice donning limb guard for skin check; good carryover of education from prior sessions, including importance of resting L knee in extension. Continue to recommend SNF-level therapies to maximize functional mobility and independence prior to return home.    Follow Up Recommendations  SNF;Supervision for mobility/OOB     Equipment Recommendations  Rolling walker with 5" wheels;3in1 (PT);Wheelchair (measurements PT);Wheelchair cushion (measurements PT)    Recommendations for Other Services       Precautions / Restrictions Precautions Precautions: Fall;Other (comment) Precaution Comments: 3L O2 baseline Required Braces or Orthoses: Other Brace Other Brace: L BKA limb guard Restrictions Weight Bearing Restrictions: Yes LLE Weight Bearing: Non weight bearing    Mobility  Bed Mobility Overal bed mobility: Needs Assistance Bed Mobility: Supine to Sit     Supine to sit: Supervision;HOB elevated     General bed mobility comments: Received sitting EOB with OT  Transfers Overall transfer level: Needs assistance Equipment used: Rolling walker (2 wheeled) Transfers: Sit to/from Stand Sit to Stand: Mod assist Stand pivot transfers: +2 physical assistance;+2 safety/equipment;Mod assist       General transfer comment: Mod A x1 or Min A x 2 for power up  on L side to gain balance  Ambulation/Gait Ambulation/Gait assistance: Mod assist;+2 physical assistance;+2 safety/equipment Gait Distance (Feet): 1 Feet Assistive device: Rolling walker (2 wheeled)   Gait velocity: Decreased   General Gait Details: ModA+2 for taking steps to chair via hopping on RLE with RW; assist to maintain balance and manage RW. Pt quick to fatigue with this requiring seated rest. DOE 3/4   Stairs             Wheelchair Mobility    Modified Rankin (Stroke Patients Only)       Balance Overall balance assessment: Needs assistance Sitting-balance support: No upper extremity supported Sitting balance-Leahy Scale: Good     Standing balance support: Bilateral upper extremity supported;During functional activity Standing balance-Leahy Scale: Poor Standing balance comment: Reliant on BUE support for static standing balance                            Cognition Arousal/Alertness: Awake/alert Behavior During Therapy: WFL for tasks assessed/performed Overall Cognitive Status: Impaired/Different from baseline Area of Impairment: Problem solving;Awareness                           Awareness: Emergent Problem Solving: Slow processing;Requires verbal cues General Comments: improving carryover with cues for safety and hand placement during transitional movements      Exercises      General Comments General comments (skin integrity, edema, etc.): SpO2 99-100% on 3L O2 [place]. Pt practiced doffing L BKA limb guard for skin check, able to tell PT knee should be resting in extension  Pertinent Vitals/Pain Pain Assessment: No/denies pain Pain Intervention(s): Monitored during session    Home Living                      Prior Function            PT Goals (current goals can now be found in the care plan section) Acute Rehab PT Goals Patient Stated Goal: pt wants to go to rehab Progress towards PT goals: Progressing  toward goals    Frequency    Min 3X/week      PT Plan Current plan remains appropriate    Co-evaluation PT/OT/SLP Co-Evaluation/Treatment:  (overlap) Reason for Co-Treatment: For patient/therapist safety;To address functional/ADL transfers   OT goals addressed during session: ADL's and self-care;Other (comment) (ADL mobilty)      AM-PAC PT "6 Clicks" Mobility   Outcome Measure  Help needed turning from your back to your side while in a flat bed without using bedrails?: None Help needed moving from lying on your back to sitting on the side of a flat bed without using bedrails?: A Little Help needed moving to and from a bed to a chair (including a wheelchair)?: A Little Help needed standing up from a chair using your arms (e.g., wheelchair or bedside chair)?: A Lot Help needed to walk in hospital room?: A Lot Help needed climbing 3-5 steps with a railing? : Total 6 Click Score: 15    End of Session Equipment Utilized During Treatment: Gait belt Activity Tolerance: Patient tolerated treatment well Patient left: in chair;with call bell/phone within reach;with chair alarm set Nurse Communication: Mobility status PT Visit Diagnosis: Unsteadiness on feet (R26.81);Other abnormalities of gait and mobility (R26.89);Repeated falls (R29.6);Muscle weakness (generalized) (M62.81)     Time: 5009-3818 PT Time Calculation (min) (ACUTE ONLY): 19 min  Charges:  $Therapeutic Activity: 8-22 mins                     Ina Homes, PT, DPT Acute Rehabilitation Services  Pager [phone] Office [phone]  Malachy Chamber 02/01/2020, 4:55 PM

## 2020-02-01 NOTE — Progress Notes (Signed)
Placed patient on home settings of BIPAP 8/4. Patient stated that the pressure was lower than the pressure at home. Increased the patients settings until they felt favorable to the ones at home. Settings IPAP=14 with EPAP=7 and oxygen set at 3lpm.

## 2020-02-01 NOTE — Progress Notes (Signed)
Occupational Therapy Treatment Patient Details Name: Nicole [name] MRN: [id number] DOB: 1965-05-04 Today's Date: 02/01/2020    History of present illness Pt is a 54 y.o. female admitted 01/22/20 with worsening L foot infection and osteomyelitis. S/p L transtibial amputation 12/3. PMH includes COPD, HTN, DM, morbid obesity, CHF, CAD, afib on Eliquis.   OT comments  Pt progressing towards OT goals, pleasant and agreeable to attempt all tasks. Trialed taking steps to recliner using RW at Mod A x 2, unsteadiness and fatigue noted with this task. Pt overall Max A for peri care in standing, able to complete UB ADLs with setup assist at EOB. Educated on safety techniques for LB ADLs using lateral leans with plans to improve balance and progress to LB ADLs in standing. VSS on 3 L O2. Discharge recommendations remain appropriate.    Follow Up Recommendations  SNF    Equipment Recommendations  3 in 1 bedside commode;Wheelchair cushion (measurements OT);Wheelchair (measurements OT)    Recommendations for Other Services      Precautions / Restrictions Precautions Precautions: Fall;Other (comment) Precaution Comments: 3L O2 baseline Required Braces or Orthoses: Other Brace Other Brace: L BKA limb guard Restrictions Weight Bearing Restrictions: Yes LLE Weight Bearing: Non weight bearing       Mobility Bed Mobility Overal bed mobility: Needs Assistance Bed Mobility: Supine to Sit     Supine to sit: Supervision;HOB elevated     General bed mobility comments: Supervision, reliant on momentum and use of bed rails  Transfers Overall transfer level: Needs assistance Equipment used: Rolling walker (2 wheeled) Transfers: Sit to/from [employer] Sit to Stand: Mod assist Stand pivot transfers: +2 physical assistance;+2 safety/equipment;Mod assist       General transfer comment: Mod A x1 or Min A x 2 for power up on L side to gain balance. Mod A x 2 for taking steps to  chair via hopping with RW. Assistance to maintain balance and sequence RW.    Balance Overall balance assessment: Needs assistance Sitting-balance support: No upper extremity supported Sitting balance-Leahy Scale: Good     Standing balance support: Bilateral upper extremity supported;During functional activity Standing balance-Leahy Scale: Poor Standing balance comment: Reliant on BUE support for static standing balance                           ADL either performed or assessed with clinical judgement   ADL Overall ADL's : Needs assistance/impaired                 Upper Body Dressing : Set up;Sitting Upper Body Dressing Details (indicate cue type and reason): Setup to don clean gown sitting EOB Lower Body Dressing: Sitting/lateral leans;Maximal assistance Lower Body Dressing Details (indicate cue type and reason): Unable to reach R sock, educated on safety with LB dressing and using lateral leans for increased independence. Pt able to demo lateral leans but sitting balance deficits noted Toilet Transfer: Minimal assistance;+2 for safety/equipment;Stand-pivot;RW Toilet Transfer Details (indicate cue type and reason): simulated to recliner with a few hops using RW +2 for safety in hopping Toileting- Clothing Manipulation and Hygiene: Maximal assistance;Sit to/from stand Toileting - Clothing Manipulation Details (indicate cue type and reason): Max A for sit to stand with RW for peri care after urine incontinence in bed.   Tub/Shower Transfer Details (indicate cue type and reason): Pt reports small tub shower at home. Discussed safety with transfer bench (but pt unsure if this would fit in  tub).   General ADL Comments: Pt progressing towards goals, limited by balance deficits and phantom sensations increasing fall risk     Vision   Vision Assessment?: No apparent visual deficits   Perception     Praxis      Cognition Arousal/Alertness: Awake/alert Behavior  During Therapy: WFL for tasks assessed/performed Overall Cognitive Status: Impaired/Different from baseline Area of Impairment: Problem solving;Awareness                           Awareness: Emergent Problem Solving: Slow processing;Requires verbal cues General Comments: improving carryover with cues for safety and hand placement during transitional movements        Exercises     Shoulder Instructions       General Comments SpO2 99-100% on 3 L O2, HR WFL.    Pertinent Vitals/ Pain       Pain Assessment: No/denies pain  Home Living                                          Prior Functioning/Environment              Frequency  Min 2X/week        Progress Toward Goals  OT Goals(current goals can now be found in the care plan section)  Progress towards OT goals: Progressing toward goals  Acute Rehab OT Goals Patient Stated Goal: pt wants to go to rehab OT Goal Formulation: With patient Time For Goal Achievement: 02/10/20 Potential to Achieve Goals: Good ADL Goals Pt Will Perform Lower Body Dressing: with min assist;sitting/lateral leans;sit to/from stand Pt Will Transfer to Toilet: with mod assist;squat pivot transfer;bedside commode Pt Will Perform Toileting - Clothing Manipulation and hygiene: with mod assist;sitting/lateral leans;sit to/from stand Pt/caregiver will Perform Home Exercise Program: Increased strength;Both right and left upper extremity;With Supervision Additional ADL Goal #1: Pt will increase sitting tolerance x10 mins for ADL tasks with supervisionA.  Plan Discharge plan remains appropriate;Frequency remains appropriate    Co-evaluation    PT/OT/SLP Co-Evaluation/Treatment: Yes Reason for Co-Treatment: For patient/therapist safety;To address functional/ADL transfers   OT goals addressed during session: ADL's and self-care;Other (comment) (ADL mobilty)      AM-PAC OT "6 Clicks" Daily Activity     Outcome  Measure   Help from another person eating meals?: None Help from another person taking care of personal grooming?: A Little Help from another person toileting, which includes using toliet, bedpan, or urinal?: A Lot Help from another person bathing (including washing, rinsing, drying)?: A Lot Help from another person to put on and taking off regular upper body clothing?: A Little Help from another person to put on and taking off regular lower body clothing?: A Lot 6 Click Score: 16    End of Session Equipment Utilized During Treatment: Gait belt;Rolling walker  OT Visit Diagnosis: Unsteadiness on feet (R26.81);Muscle weakness (generalized) (M62.81);Pain Pain - Right/Left: Left Pain - part of body: Leg   Activity Tolerance Patient tolerated treatment well   Patient Left in chair;Other (comment) (with PT)   Nurse Communication          Time: 2025-4270 OT Time Calculation (min): 24 min  Charges: OT General Charges $OT Visit: 1 Visit OT Treatments $Self Care/Home Management : 8-22 mins  Lorre Munroe, OTR/L   Lorre Munroe 02/01/2020, 1:22 PM

## 2020-02-02 LAB — GLUCOSE, CAPILLARY
Glucose-Capillary: 197 mg/dL — ABNORMAL HIGH (ref 70–99)
Glucose-Capillary: 172 mg/dL — ABNORMAL HIGH (ref 70–99)
Glucose-Capillary: 222 mg/dL — ABNORMAL HIGH (ref 70–99)
Glucose-Capillary: 239 mg/dL — ABNORMAL HIGH (ref 70–99)

## 2020-02-02 MED ORDER — INSULIN GLARGINE 100 UNIT/ML ~~LOC~~ SOLN
35.0000 [IU] | Freq: Two times a day (BID) | SUBCUTANEOUS | Status: DC
  Administered 2020-02-02 (×2): 35 [IU] via SUBCUTANEOUS
  Filled 2020-02-02 (×4): qty 0.35

## 2020-02-02 NOTE — TOC Progression Note (Signed)
Transition of Care [facility]) - Progression Note    Patient Details  Name: Nicole [name] MRN: [id number] Date of Birth: 07/08/1965  Transition of Care [facility]) CM/SW Contact  Lorri Frederick, LCSW Phone Number: 02/02/2020, 9:18 AM  Clinical Narrative:   CSW spoke with Eunice Blase at [facility].  They had an issue with the [facility] auth that has not been resolved yet.  She will let CSW know if the [facility] returns today.    Expected Discharge Plan: Skilled Nursing Facility Barriers to Discharge: No SNF bed,Insurance Authorization  Expected Discharge Plan and Services Expected Discharge Plan: Skilled Nursing Facility         Expected Discharge Date: 01/30/20                                     Social Determinants of Health (SDOH) Interventions    Readmission Risk Interventions No flowsheet data found.

## 2020-02-02 NOTE — Progress Notes (Signed)
PROGRESS NOTE    Nicole [name]  SVX:[id number] DOB: April 13, 1965 DOA: 01/22/2020 PCP: [name], MD   Brief Narrative:  54 year old female with past medical history for COPD, dyslipidemia, hypertension, obesity class III, type 2 diabetes mellitus, coronary artery disease, diastolic heart failure, atrial fibrillation, and obstructive sleep apnea who presents with left foot pain.  She had a recent hospitalization 10/13-10/18 for left diabetic foot infection/cellulitis, received antibiotic therapy, and was discharged home with home health services.  Left foot radiograph with extensive soft tissue edema and subcutaneous emphysema, throughout the left foot compatible with infection.  Cortical irregularity lateral margin of the distal tuft first distal phalanx consistent with osteomyelitis.Patient was placed on broad-spectrum antibiotic therapy, orthopedics was consulted, and patient was transferred to [facility] for further surgical orthopedic intervention. Patient underwent left transtibial amputation on 12/03.    She continue to be very weak and deconditioned, plan to transfer to SNF.    Assessment & Plan:   Principal Problem:   Sepsis due to Lt foot osteo Active Problems:   Coronary atherosclerosis of native coronary artery/Stent in 2006   Diabetes mellitus type 2 in obese (HCC)   HTN (hypertension), benign   COPD (chronic obstructive pulmonary disease) (HCC)   Diabetic ulcer of left foot (HCC)   Cellulitis of left foot   Osteomyelitis of left foot (HCC)   Hypokalemia   Hypoalbuminemia   Hyperglycemia due to diabetes mellitus (HCC)   Morbid obesity with body mass index (BMI) of 50.0 to 59.9 in adult [facility])   Tobacco abuse--2 P/day Smoker   Acute osteomyelitis of toe, left (HCC)     1.  Left foot osteomyelitis, diabetic foot infection, complicated with sepsis, present on admission.    Patient underwent left transtibial amputation on 12/3.  PT recommended  SNF   Patient has a wound VAC in place, will follow-up with surgery as an outpatient.   2.  Hypertension/coronary artery disease.  Continue blood pressure control with metoprolol and lisinopril. At discharge will resume clopidogrel and furosemide.    3.  Uncontrolled type II diabetes mellitus, dyslipidemia.    Continue Lipitor.  Poorly controlled diabetes and blood glucose level seems to be improving.  Will increase Lantus to 35 units twice daily, Premeal NovoLog to 8 units.  Continue sliding scale.   4.  Hypokalemia, hypomagnesemia, metabolic alkalosis.    Replete as necessary   5.  Obesity class III, severe sleep apnea.  Calculated BMI 51.3.  Continue CPAP at night and napping. Continue physical therapy.   6.  Chronic atrial fibrillation.  Continue rate control metoprolol, anticoagulation with apixaban.   7.  COPD, tobacco abuse.  No signs of acute exacerbation, continue Dulera.  Smoking cessation.   8.  Depression/insomnia.  Continue duloxetine and bupropion.  Trazodone was added with good toleration.    DVT prophylaxis: SCDs Start: 01/25/20 1232 SCDs Start: 01/22/20 2023 apixaban (ELIQUIS) tablet 5 mg  Code Status: Full code Family Communication: Spoke with daughter over FaceTime  Status is: Inpatient  Remains inpatient appropriate because:Unsafe d/c plan   Dispo: The patient is from: Home              Anticipated d/c is to: SNF              Anticipated d/c date is: 1 day              Patient currently is medically stable to d/c.  Pending SNF bed  Body mass index is 51.37 kg/m.        Subjective: , No acute events overnight   Examination:  Constitutional: Not in acute distress, 3 L nasal cannula nasal cannula Respiratory: Clear to auscultation bilaterally Cardiovascular: Normal sinus rhythm, no rubs Abdomen: Nontender nondistended good bowel sounds Musculoskeletal: No edema noted Skin: No rashes seen Neurologic: CN 2-12 grossly intact.  And  nonfocal Psychiatric: Normal judgment and insight. Alert and oriented x 3. Normal mood.   Objective: Vitals:   02/01/20 2343 02/01/20 2352 02/02/20 0359 02/02/20 0700  BP:   (!) 159/76 140/64  Pulse: 96 94 89 88  Resp: (!) 22 20 15 18   Temp:   97.7 F (36.5 C) 97.6 F (36.4 C)  TempSrc:   Oral Oral  SpO2: 95%  97% 98%  Weight:      Height:        Intake/Output Summary (Last 24 hours) at 02/02/2020 0829 Last data filed at 02/02/2020 0300 Gross per 24 hour  Intake 720 ml  Output 1051 ml  Net -331 ml   Filed Weights   01/22/20 1508  Weight: 131.5 kg     Data Reviewed:   CBC: Recent Labs  Lab 01/27/20 0244 01/29/20 0359  WBC 15.2* 14.2*  NEUTROABS 11.0* 9.7*  HGB 10.4* 10.3*  HCT 33.3* 34.0*  MCV 96.5 96.0  PLT 363 347   Basic Metabolic Panel: Recent Labs  Lab 01/28/20 0818 01/30/20 0533 02/01/20 0413  NA 140  --  140  K 3.9  --  4.3  CL 98  --  100  CO2 32  --  30  GLUCOSE 255*  --  228*  BUN 14  --  14  CREATININE 0.74 0.73 0.73  CALCIUM 9.5  --  9.0  MG  --   --  1.6*   GFR: Estimated Creatinine Clearance: 105.4 mL/min (by C-G formula based on SCr of 0.73 mg/dL). Liver Function Tests: No results for input(s): AST, ALT, ALKPHOS, BILITOT, PROT, ALBUMIN in the last 168 hours. No results for input(s): LIPASE, AMYLASE in the last 168 hours. No results for input(s): AMMONIA in the last 168 hours. Coagulation Profile: No results for input(s): INR, PROTIME in the last 168 hours. Cardiac Enzymes: No results for input(s): CKTOTAL, CKMB, CKMBINDEX, TROPONINI in the last 168 hours. BNP (last 3 results) No results for input(s): PROBNP in the last 8760 hours. HbA1C: No results for input(s): HGBA1C in the last 72 hours. CBG: Recent Labs  Lab 02/01/20 0550 02/01/20 1128 02/01/20 1724 02/01/20 2116 02/02/20 0617  GLUCAP 174* 189* 220* 192* 197*   Lipid Profile: No results for input(s): CHOL, HDL, LDLCALC, TRIG, CHOLHDL, LDLDIRECT in the last 72  hours. Thyroid Function Tests: No results for input(s): TSH, T4TOTAL, FREET4, T3FREE, THYROIDAB in the last 72 hours. Anemia Panel: No results for input(s): VITAMINB12, FOLATE, FERRITIN, TIBC, IRON, RETICCTPCT in the last 72 hours. Sepsis Labs: No results for input(s): PROCALCITON, LATICACIDVEN in the last 168 hours.  Recent Results (from the past 240 hour(s))  Surgical pcr screen     Status: None   Collection Time: 01/25/20 12:06 AM   Specimen: Nasal Mucosa; Nasal Swab  Result Value Ref Range Status   MRSA, PCR NEGATIVE NEGATIVE Final   Staphylococcus aureus NEGATIVE NEGATIVE Final    Comment: (NOTE) The Xpert SA Assay (FDA approved for NASAL specimens in patients 36 years of age and older), is one component of a comprehensive surveillance program. It is not intended to diagnose  infection nor to guide or monitor treatment. Performed at [facility], [address] [place], [place] [address]          Radiology Studies: No results found.      Scheduled Meds:  acetaminophen  500 mg Oral Q6H   apixaban  5 mg Oral BID   atorvastatin  40 mg Oral Daily   buPROPion  100 mg Oral BID   celecoxib  200 mg Oral BID   docusate sodium  100 mg Oral BID   DULoxetine  30 mg Oral BID   feeding supplement (GLUCERNA SHAKE)  237 mL Oral BID   insulin aspart  0-15 Units Subcutaneous TID WC   insulin aspart  0-5 Units Subcutaneous QHS   insulin aspart  8 Units Subcutaneous TID WC   insulin glargine  32 Units Subcutaneous BID   lisinopril  2.5 mg Oral Daily   metoprolol tartrate  50 mg Oral BID   mometasone-formoterol  2 puff Inhalation BID   nicotine  21 mg Transdermal Daily   pantoprazole  40 mg Oral Daily   traZODone  50 mg Oral QHS   Continuous Infusions:  lactated ringers 10 mL/hr at 01/25/20 0945     LOS: 11 days   Time spent= 20 mins    Julene Rahn Joline Maxcy, MD [facility]  If 7PM-7AM, please contact night-coverage  02/02/2020, 8:29 AM

## 2020-02-03 LAB — GLUCOSE, CAPILLARY
Glucose-Capillary: 239 mg/dL — ABNORMAL HIGH (ref 70–99)
Glucose-Capillary: 174 mg/dL — ABNORMAL HIGH (ref 70–99)
Glucose-Capillary: 178 mg/dL — ABNORMAL HIGH (ref 70–99)
Glucose-Capillary: 133 mg/dL — ABNORMAL HIGH (ref 70–99)

## 2020-02-03 MED ORDER — INSULIN GLARGINE 100 UNIT/ML ~~LOC~~ SOLN
45.0000 [IU] | Freq: Two times a day (BID) | SUBCUTANEOUS | Status: DC
  Administered 2020-02-03 – 2020-02-06 (×7): 45 [IU] via SUBCUTANEOUS
  Filled 2020-02-03 (×9): qty 0.45

## 2020-02-03 NOTE — TOC Progression Note (Signed)
Transition of Care [facility]) - Progression Note    Patient Details  Name: Nicole [name] MRN: [id number] Date of Birth: 14-Oct-1965  Transition of Care [facility]) CM/SW Contact  Zafar Debrosse [place], [place] Phone Number: 02/03/2020, 1:24 PM  Clinical Narrative:    Phone call to [facility], spoke with Debbie. Their corporate office continues to work on [employer] authorization. Eunice Blase will call back once issue is resolved.  [address], LCSW Transition of Care [phone]    Expected Discharge Plan: Skilled Nursing Facility Barriers to Discharge: No SNF bed,Insurance Authorization  Expected Discharge Plan and Services Expected Discharge Plan: Skilled Nursing Facility         Expected Discharge Date: 01/30/20                                     Social Determinants of Health (SDOH) Interventions    Readmission Risk Interventions No flowsheet data found.

## 2020-02-03 NOTE — Progress Notes (Signed)
Placed patient on CPAP for the night with oxygen set at 3lpm.  

## 2020-02-03 NOTE — Plan of Care (Signed)
  Problem: Education: Goal: Knowledge of General Education information will improve Description: Including pain rating scale, medication(s)/side effects and non-pharmacologic comfort measures Outcome: Progressing   Problem: Health Behavior/Discharge Planning: Goal: Ability to manage health-related needs will improve Outcome: Progressing   Problem: Activity: Goal: Risk for activity intolerance will decrease Outcome: Progressing   Problem: Elimination: Goal: Will not experience complications related to bowel motility Outcome: Progressing   Problem: Pain Managment: Goal: General experience of comfort will improve Outcome: Progressing   Problem: Skin Integrity: Goal: Risk for impaired skin integrity will decrease Outcome: Progressing   

## 2020-02-03 NOTE — Progress Notes (Signed)
PROGRESS NOTE    Nicole [name]  VQQ:[id number] DOB: Oct 22, 1965 DOA: 01/22/2020 PCP: [name], MD   Brief Narrative:  54 year old female with past medical history for COPD, dyslipidemia, hypertension, obesity class III, type 2 diabetes mellitus, coronary artery disease, diastolic heart failure, atrial fibrillation, and obstructive sleep apnea who presents with left foot pain.  She had a recent hospitalization 10/13-10/18 for left diabetic foot infection/cellulitis, received antibiotic therapy, and was discharged home with home health services.  Left foot radiograph with extensive soft tissue edema and subcutaneous emphysema, throughout the left foot compatible with infection.  Cortical irregularity lateral margin of the distal tuft first distal phalanx consistent with osteomyelitis.Patient was placed on broad-spectrum antibiotic therapy, orthopedics was consulted, and patient was transferred to [facility] from [facility] for further surgical orthopedic intervention. Patient underwent left transtibial amputation on 12/03.    She continue to be very weak and deconditioned, plan to transfer to SNF.    Assessment & Plan:   Principal Problem:   Sepsis due to Lt foot osteo Active Problems:   Coronary atherosclerosis of native coronary artery/Stent in 2006   Diabetes mellitus type 2 in obese (HCC)   HTN (hypertension), benign   COPD (chronic obstructive pulmonary disease) (HCC)   Diabetic ulcer of left foot (HCC)   Cellulitis of left foot   Osteomyelitis of left foot (HCC)   Hypokalemia   Hypoalbuminemia   Hyperglycemia due to diabetes mellitus (HCC)   Morbid obesity with body mass index (BMI) of 50.0 to 59.9 in adult [facility])   Tobacco abuse--2 P/day Smoker   Acute osteomyelitis of toe, left (HCC)     1.  Left foot osteomyelitis, diabetic foot infection, complicated with sepsis, present on admission.    Patient underwent left transtibial amputation on 12/3.  PT recommended  SNF   Patient has a wound VAC in place, will follow-up with surgery as an outpatient.   2.  Hypertension/coronary artery disease.  Continue blood pressure control with metoprolol and lisinopril. At discharge will resume clopidogrel and furosemide.    3.  Uncontrolled type II diabetes mellitus, dyslipidemia.    Continue Lipitor.  Poorly controlled diabetes and blood glucose level seems to be improving.  Will increase Lantus to 45 units twice daily, Premeal NovoLog to 8 units.  Continue sliding scale.   4.  Hypokalemia, hypomagnesemia, metabolic alkalosis.    Replete as necessary   5.  Obesity class III, severe sleep apnea.  Calculated BMI 51.3.  Continue CPAP at night and napping. Continue physical therapy.   6.  Chronic atrial fibrillation.  Continue rate control metoprolol, anticoagulation with apixaban.   7.  COPD, tobacco abuse.  No signs of acute exacerbation, continue Dulera.  Smoking cessation.   8.  Depression/insomnia.  Continue duloxetine and bupropion.  Trazadone.     DVT prophylaxis: SCDs Start: 01/25/20 1232 SCDs Start: 01/22/20 2023 apixaban (ELIQUIS) tablet 5 mg  Code Status: Full code Family Communication:   Status is: Inpatient  Remains inpatient appropriate because:Unsafe d/c plan   Dispo: The patient is from: Home              Anticipated d/c is to: SNF              Anticipated d/c date is: 1 day              Patient currently is medically stable to d/c.  Pending SNF bed.      Body mass index is 51.37 kg/m.  Subjective: Sitting at the side of the bed no complaints.  Okay with going home with home health if her insurance projects over for rehab.  I have told her that I do think she would benefit from rehab more so than at home.  Examination:  Constitutional: Not in acute distress, 3 L nasal cannula Respiratory: Clear to auscultation bilaterally Cardiovascular: Normal sinus rhythm, no rubs Abdomen: Nontender nondistended good bowel  sounds Musculoskeletal: No edema noted Skin: No rashes seen Neurologic: CN 2-12 grossly intact.  And nonfocal Psychiatric: Normal judgment and insight. Alert and oriented x 3. Normal mood.  Objective: Vitals:   02/02/20 1958 02/02/20 2032 02/03/20 0523 02/03/20 0731  BP:  (!) 153/64 124/63   Pulse: 86 100 79   Resp: 18 17 15 16   Temp:  98.1 F (36.7 C) 97.6 F (36.4 C)   TempSrc:  Oral Oral   SpO2: 97% 98% 99% 98%  Weight:      Height:        Intake/Output Summary (Last 24 hours) at 02/03/2020 0814 Last data filed at 02/03/2020 0100 Gross per 24 hour  Intake 1320 ml  Output 2100 ml  Net -780 ml   Filed Weights   01/22/20 1508  Weight: 131.5 kg     Data Reviewed:   CBC: Recent Labs  Lab 01/29/20 0359  WBC 14.2*  NEUTROABS 9.7*  HGB 10.3*  HCT 34.0*  MCV 96.0  PLT 347   Basic Metabolic Panel: Recent Labs  Lab 01/28/20 0818 01/30/20 0533 02/01/20 0413  NA 140  --  140  K 3.9  --  4.3  CL 98  --  100  CO2 32  --  30  GLUCOSE 255*  --  228*  BUN 14  --  14  CREATININE 0.74 0.73 0.73  CALCIUM 9.5  --  9.0  MG  --   --  1.6*   GFR: Estimated Creatinine Clearance: 105.4 mL/min (by C-G formula based on SCr of 0.73 mg/dL). Liver Function Tests: No results for input(s): AST, ALT, ALKPHOS, BILITOT, PROT, ALBUMIN in the last 168 hours. No results for input(s): LIPASE, AMYLASE in the last 168 hours. No results for input(s): AMMONIA in the last 168 hours. Coagulation Profile: No results for input(s): INR, PROTIME in the last 168 hours. Cardiac Enzymes: No results for input(s): CKTOTAL, CKMB, CKMBINDEX, TROPONINI in the last 168 hours. BNP (last 3 results) No results for input(s): PROBNP in the last 8760 hours. HbA1C: No results for input(s): HGBA1C in the last 72 hours. CBG: Recent Labs  Lab 02/02/20 0617 02/02/20 1126 02/02/20 1629 02/02/20 2033 02/03/20 0653  GLUCAP 197* 172* 222* 239* 239*   Lipid Profile: No results for input(s): CHOL, HDL,  LDLCALC, TRIG, CHOLHDL, LDLDIRECT in the last 72 hours. Thyroid Function Tests: No results for input(s): TSH, T4TOTAL, FREET4, T3FREE, THYROIDAB in the last 72 hours. Anemia Panel: No results for input(s): VITAMINB12, FOLATE, FERRITIN, TIBC, IRON, RETICCTPCT in the last 72 hours. Sepsis Labs: No results for input(s): PROCALCITON, LATICACIDVEN in the last 168 hours.  Recent Results (from the past 240 hour(s))  Surgical pcr screen     Status: None   Collection Time: 01/25/20 12:06 AM   Specimen: Nasal Mucosa; Nasal Swab  Result Value Ref Range Status   MRSA, PCR NEGATIVE NEGATIVE Final   Staphylococcus aureus NEGATIVE NEGATIVE Final    Comment: (NOTE) The Xpert SA Assay (FDA approved for NASAL specimens in patients 43 years of age and older), is one  component of a comprehensive surveillance program. It is not intended to diagnose infection nor to guide or monitor treatment. Performed at [facility], [address] [place], [place] [address]          Radiology Studies: No results found.      Scheduled Meds: . acetaminophen  500 mg Oral Q6H  . apixaban  5 mg Oral BID  . atorvastatin  40 mg Oral Daily  . buPROPion  100 mg Oral BID  . celecoxib  200 mg Oral BID  . docusate sodium  100 mg Oral BID  . DULoxetine  30 mg Oral BID  . feeding supplement (GLUCERNA SHAKE)  237 mL Oral BID  . insulin aspart  0-15 Units Subcutaneous TID WC  . insulin aspart  0-5 Units Subcutaneous QHS  . insulin aspart  8 Units Subcutaneous TID WC  . insulin glargine  45 Units Subcutaneous BID  . lisinopril  2.5 mg Oral Daily  . metoprolol tartrate  50 mg Oral BID  . mometasone-formoterol  2 puff Inhalation BID  . pantoprazole  40 mg Oral Daily  . traZODone  50 mg Oral QHS   Continuous Infusions: . lactated ringers 10 mL/hr at 01/25/20 0945     LOS: 12 days   Time spent= 20 mins    Saamir Armstrong Joline Maxcy, MD [facility]  If 7PM-7AM, please contact  night-coverage  02/03/2020, 8:14 AM

## 2020-02-04 ENCOUNTER — Ambulatory Visit: Payer: Medicare (Managed Care) | Admitting: "Endocrinology

## 2020-02-04 LAB — BASIC METABOLIC PANEL
Anion gap: 9 (ref 5–15)
BUN: 18 mg/dL (ref 6–20)
CO2: 33 mmol/L — ABNORMAL HIGH (ref 22–32)
Calcium: 9.1 mg/dL (ref 8.9–10.3)
Chloride: 97 mmol/L — ABNORMAL LOW (ref 98–111)
Creatinine, Ser: 0.95 mg/dL (ref 0.44–1.00)
GFR, Estimated: >60 mL/min (ref >60)
Glucose, Bld: 174 mg/dL — ABNORMAL HIGH (ref 70–99)
Potassium: 4.3 mmol/L (ref 3.5–5.1)
Sodium: 139 mmol/L (ref 135–145)

## 2020-02-04 LAB — CREATININE, SERUM
Creatinine, Ser: 0.87 mg/dL (ref 0.44–1.00)
GFR, Estimated: >60 mL/min (ref >60)

## 2020-02-04 LAB — GLUCOSE, CAPILLARY
Glucose-Capillary: 157 mg/dL — ABNORMAL HIGH (ref 70–99)
Glucose-Capillary: 169 mg/dL — ABNORMAL HIGH (ref 70–99)
Glucose-Capillary: 133 mg/dL — ABNORMAL HIGH (ref 70–99)
Glucose-Capillary: 168 mg/dL — ABNORMAL HIGH (ref 70–99)

## 2020-02-04 LAB — MAGNESIUM: Magnesium: 1.6 mg/dL — ABNORMAL LOW (ref 1.7–2.4)

## 2020-02-04 MED ORDER — MAGNESIUM SULFATE 4 GM/100ML IV SOLN
4.0000 g | Freq: Once | INTRAVENOUS | Status: AC
  Administered 2020-02-04: 4 g via INTRAVENOUS
  Filled 2020-02-04: qty 100

## 2020-02-04 MED ORDER — PANTOPRAZOLE SODIUM 40 MG PO TBEC
40.0000 mg | DELAYED_RELEASE_TABLET | Freq: Every day | ORAL | Status: DC
  Administered 2020-02-05 – 2020-02-06 (×2): 40 mg via ORAL
  Filled 2020-02-04 (×3): qty 1

## 2020-02-04 MED ORDER — CLOPIDOGREL BISULFATE 75 MG PO TABS
75.0000 mg | ORAL_TABLET | Freq: Every day | ORAL | Status: DC
  Administered 2020-02-04 – 2020-02-06 (×3): 75 mg via ORAL
  Filled 2020-02-04 (×3): qty 1

## 2020-02-04 NOTE — Care Management Important Message (Signed)
Important Message  Patient Details  Name: Nicole [name] MRN: [id number] Date of Birth: 1965/09/27   Medicare Important Message Given:  Yes - Important Message mailed due to current National Emergency   Verbal consent obtained due to current National Emergency  Relationship to patient: Self Contact Name: Ruben Pyka Call Date: 02/04/20  Time: 1450 Phone: [phone] Outcome: No Answer/Busy Important Message mailed to: Patient address on file    Orson Aloe 02/04/2020, 2:50 PM

## 2020-02-04 NOTE — Plan of Care (Signed)

## 2020-02-04 NOTE — Progress Notes (Signed)
Physical Therapy Treatment Patient Details Name: Nicole [name] MRN: [id number] DOB: 14-Jan-1966 Today's Date: 02/04/2020    History of Present Illness Pt is a 54 y.o. female admitted 01/22/20 with worsening L foot infection and osteomyelitis. S/p L transtibial amputation 12/3. PMH includes COPD, HTN, DM, morbid obesity, CHF, CAD, afib on Eliquis.    PT Comments    Pt was seen for transfers and exercise in chair and assisted back to bed due to fatigue.  Her plan is to continue to make progress with all mobility and strengthening to get to rehab and home in a timely way.  Follow her for these needs, make her standing balance skills a priority and encourage OOB to chair as staff has been doing.  Follow Up Recommendations  SNF     Equipment Recommendations  Rolling walker with 5" wheels;3in1 (PT);Wheelchair (measurements PT);Wheelchair cushion (measurements PT)    Recommendations for Other Services       Precautions / Restrictions Precautions Precautions: Fall Precaution Comments: 3L O2 Required Braces or Orthoses: Other Brace Other Brace: L BKA limb guard Restrictions Weight Bearing Restrictions: Yes LLE Weight Bearing: Non weight bearing    Mobility  Bed Mobility Overal bed mobility: Needs Assistance Bed Mobility: Sit to Supine       Sit to supine: Min assist   General bed mobility comments: on chair when PT arrived, asking to use BSC  Transfers Overall transfer level: Needs assistance Equipment used: 1 person hand held assist Transfers: Stand Pivot Transfers;Sit to/from Stand Sit to Stand: Mod assist;Min assist Stand pivot transfers: Mod assist       General transfer comment: used SPT to [facility] and chair then back to bed, STS for cleaning up at [facility]  Ambulation/Gait                 Stairs             Wheelchair Mobility    Modified Rankin (Stroke Patients Only)       Balance Overall balance assessment: Needs assistance Sitting-balance  support: Single extremity supported Sitting balance-Leahy Scale: Good Sitting balance - Comments: on side of bed securely                                    Cognition Arousal/Alertness: Awake/alert Behavior During Therapy: WFL for tasks assessed/performed Overall Cognitive Status: Impaired/Different from baseline Area of Impairment: Problem solving;Safety/judgement;Attention                   Current Attention Level: Selective     Safety/Judgement: Decreased awareness of safety;Decreased awareness of deficits Awareness: Anticipatory Problem Solving: Slow processing;Requires verbal cues;Requires tactile cues General Comments: pt can give instructions to PT for plan to transfer but is unable to initiate without cues      Exercises General Exercises - Lower Extremity Ankle Circles/Pumps: AROM;Right Long Arc Quad: Strengthening;AROM;10 reps Heel Slides: AROM;Strengthening;10 reps Hip ABduction/ADduction: Strengthening;10 reps    General Comments        Pertinent Vitals/Pain Pain Assessment: Faces Faces Pain Scale: Hurts a little bit Pain Location: L knee and incision Pain Descriptors / Indicators: Guarding Pain Intervention(s): Limited activity within patient's tolerance;Premedicated before session;Monitored during session    Home Living                      Prior Function            PT  Goals (current goals can now be found in the care plan section) Acute Rehab PT Goals Patient Stated Goal: To get prosthetic and walk again    Frequency    Min 3X/week      PT Plan Current plan remains appropriate    Co-evaluation              AM-PAC PT "6 Clicks" Mobility   Outcome Measure  Help needed turning from your back to your side while in a flat bed without using bedrails?: None Help needed moving from lying on your back to sitting on the side of a flat bed without using bedrails?: A Little Help needed moving to and from a bed  to a chair (including a wheelchair)?: A Little Help needed standing up from a chair using your arms (e.g., wheelchair or bedside chair)?: A Lot Help needed to walk in hospital room?: A Lot Help needed climbing 3-5 steps with a railing? : Total 6 Click Score: 15    End of Session Equipment Utilized During Treatment: Gait belt Activity Tolerance: Patient tolerated treatment well;Patient limited by fatigue Patient left: in bed;with call bell/phone within reach;with bed alarm set Nurse Communication: Mobility status PT Visit Diagnosis: Unsteadiness on feet (R26.81);Other abnormalities of gait and mobility (R26.89);Repeated falls (R29.6);Muscle weakness (generalized) (M62.81)     Time: 1660-6301 PT Time Calculation (min) (ACUTE ONLY): 27 min  Charges:  $Therapeutic Exercise: 8-22 mins $Therapeutic Activity: 8-22 mins                 Ivar Drape 02/04/2020, 5:22 PM  Samul Dada, PT MS Acute Rehab Dept. Number: [facility] [id number] and [facility] [phone]

## 2020-02-04 NOTE — Progress Notes (Signed)
RT NOTE:  CPAP at bedside for patient when she is ready to wear. Pt will call RN to hook O2 tubing to CPAP when ready. RT available if needed.

## 2020-02-04 NOTE — TOC Progression Note (Signed)
Transition of Care [facility]) - Progression Note    Patient Details  Name: Nicole [name] MRN: [id number] Date of Birth: Dec 27, 1965  Transition of Care [facility]) CM/SW Contact  Epifanio Lesches, RN Phone Number: 02/04/2020, 12:31 PM  Clinical Narrative:    Still awaiting insurance auth. For SNF placement. Per admission liaison @ [facility] determination should be received today. TOC team will continue to monitor and follow ....   Expected Discharge Plan: Skilled Nursing Facility Barriers to Discharge: Insurance Authorization  Expected Discharge Plan and Services Expected Discharge Plan: Skilled Nursing Facility         Expected Discharge Date: 01/30/20                                     Social Determinants of Health (SDOH) Interventions    Readmission Risk Interventions No flowsheet data found.

## 2020-02-04 NOTE — Progress Notes (Signed)
PROGRESS NOTE    CASSUNDRA MCKEEVER  TDD:[id number] DOB: [birth year] DOA: 01/22/2020 PCP: [name], MD   Brief Narrative:  54 year old female with past medical history for COPD, dyslipidemia, hypertension, obesity class III, type 2 diabetes mellitus, coronary artery disease, diastolic heart failure, atrial fibrillation, and obstructive sleep apnea who presents with left foot pain.  She had a recent hospitalization 10/13-10/18 for left diabetic foot infection/cellulitis, received antibiotic therapy, and was discharged home with home health services.  Left foot radiograph with extensive soft tissue edema and subcutaneous emphysema, throughout the left foot compatible with infection.  Cortical irregularity lateral margin of the distal tuft first distal phalanx consistent with osteomyelitis.Patient was placed on broad-spectrum antibiotic therapy, orthopedics was consulted, and patient was transferred to [facility] for further surgical orthopedic intervention. Patient underwent left transtibial amputation on 12/03.    She continue to be very weak and deconditioned, plan to transfer to SNF.    Assessment & Plan:   Principal Problem:   Sepsis due to Lt foot osteo Active Problems:   Coronary atherosclerosis of native coronary artery/Stent in 2006   Diabetes mellitus type 2 in obese (HCC)   HTN (hypertension), benign   COPD (chronic obstructive pulmonary disease) (HCC)   Diabetic ulcer of left foot (HCC)   Cellulitis of left foot   Osteomyelitis of left foot (HCC)   Hypokalemia   Hypoalbuminemia   Hyperglycemia due to diabetes mellitus (HCC)   Morbid obesity with body mass index (BMI) of 50.0 to 59.9 in adult [facility])   Tobacco abuse--2 P/day Smoker   Acute osteomyelitis of toe, left (HCC)     1.  Left foot osteomyelitis, diabetic foot infection, complicated with sepsis, present on admission.    Patient underwent left transtibial amputation on 12/3.  PT recommended  SNF, awaiting insurance authorization   Patient has a wound VAC in place, will follow-up with surgery as an outpatient.   2.  Hypertension/coronary artery disease.  Continue blood pressure control with metoprolol and lisinopril. At discharge will resume clopidogrel and furosemide.    3.  Uncontrolled type II diabetes mellitus, dyslipidemia.    Continue Lipitor.  Poorly controlled diabetes and blood glucose level seems to be improving.  Will Continue Lantus to 45 units twice daily, Premeal NovoLog to 8 units.  Continue sliding scale.   4.  Hypokalemia, hypomagnesemia, metabolic alkalosis.    Replete as necessary   5.  Obesity class III, severe sleep apnea.  Calculated BMI 51.3.  Continue CPAP at night and napping. Continue physical therapy.   6.  Chronic atrial fibrillation.  History of CAD status post stent  Continue rate control metoprolol, anticoagulation with apixaban.  Also on Plavix and statin.  Daily PPI.   7.  COPD, tobacco abuse.  No signs of acute exacerbation, continue Dulera.  Smoking cessation.   8.  Depression/insomnia.  Continue duloxetine and bupropion.  Trazadone.     DVT prophylaxis: SCDs Start: 01/25/20 1232 SCDs Start: 01/22/20 2023 apixaban (ELIQUIS) tablet 5 mg  Code Status: Full code Family Communication:   Status is: Inpatient  Remains inpatient appropriate because:Unsafe d/c plan   Dispo: The patient is from: Home              Anticipated d/c is to: SNF              Anticipated d/c date is: 1 day              Patient currently is  medically stable to d/c.  Pending insurance authorization.  TOC team working on this     Body mass index is 51.37 kg/m.    Subjective: Doing okay no complaints of waiting to hear back from her insurance company.  Examination:  Constitutional: Not in acute distress, on 3 L nasal cannula Respiratory: Clear to auscultation bilaterally Cardiovascular: Normal sinus rhythm, no rubs Abdomen: Nontender nondistended good  bowel sounds Musculoskeletal: No edema noted Skin: No rashes seen Neurologic: CN 2-12 grossly intact.  And nonfocal Psychiatric: Normal judgment and insight. Alert and oriented x 3. Normal mood.  Objective: Vitals:   02/03/20 2028 02/03/20 2101 02/03/20 2104 02/03/20 2355  BP:  130/64 118/65   Pulse:  93 90 89  Resp:  18 18 18   Temp:  (!) 97.5 F (36.4 C) 97.6 F (36.4 C)   TempSrc:  Oral Oral   SpO2: 98% 99% 99% 96%  Weight:      Height:        Intake/Output Summary (Last 24 hours) at 02/04/2020 0802 Last data filed at 02/04/2020 0500 Gross per 24 hour  Intake 240 ml  Output 800 ml  Net -560 ml   Filed Weights   01/22/20 1508  Weight: 131.5 kg     Data Reviewed:   CBC: Recent Labs  Lab 01/29/20 0359  WBC 14.2*  NEUTROABS 9.7*  HGB 10.3*  HCT 34.0*  MCV 96.0  PLT 347   Basic Metabolic Panel: Recent Labs  Lab 01/28/20 0818 01/30/20 0533 02/01/20 0413 02/04/20 0012 02/04/20 0613  NA 140  --  140 139  --   K 3.9  --  4.3 4.3  --   CL 98  --  100 97*  --   CO2 32  --  30 33*  --   GLUCOSE 255*  --  228* 174*  --   BUN 14  --  14 18  --   CREATININE 0.74 0.73 0.73 0.95 0.87  CALCIUM 9.5  --  9.0 9.1  --   MG  --   --  1.6* 1.6*  --    GFR: Estimated Creatinine Clearance: 96.9 mL/min (by C-G formula based on SCr of 0.87 mg/dL). Liver Function Tests: No results for input(s): AST, ALT, ALKPHOS, BILITOT, PROT, ALBUMIN in the last 168 hours. No results for input(s): LIPASE, AMYLASE in the last 168 hours. No results for input(s): AMMONIA in the last 168 hours. Coagulation Profile: No results for input(s): INR, PROTIME in the last 168 hours. Cardiac Enzymes: No results for input(s): CKTOTAL, CKMB, CKMBINDEX, TROPONINI in the last 168 hours. BNP (last 3 results) No results for input(s): PROBNP in the last 8760 hours. HbA1C: No results for input(s): HGBA1C in the last 72 hours. CBG: Recent Labs  Lab 02/03/20 0653 02/03/20 1249 02/03/20 1616  02/03/20 2103 02/04/20 0625  GLUCAP 239* 174* 178* 133* 157*   Lipid Profile: No results for input(s): CHOL, HDL, LDLCALC, TRIG, CHOLHDL, LDLDIRECT in the last 72 hours. Thyroid Function Tests: No results for input(s): TSH, T4TOTAL, FREET4, T3FREE, THYROIDAB in the last 72 hours. Anemia Panel: No results for input(s): VITAMINB12, FOLATE, FERRITIN, TIBC, IRON, RETICCTPCT in the last 72 hours. Sepsis Labs: No results for input(s): PROCALCITON, LATICACIDVEN in the last 168 hours.  No results found for this or any previous visit (from the past 240 hour(s)).       Radiology Studies: No results found.      Scheduled Meds: . acetaminophen  500 mg Oral Q6H  .  apixaban  5 mg Oral BID  . atorvastatin  40 mg Oral Daily  . buPROPion  100 mg Oral BID  . celecoxib  200 mg Oral BID  . docusate sodium  100 mg Oral BID  . DULoxetine  30 mg Oral BID  . feeding supplement (GLUCERNA SHAKE)  237 mL Oral BID  . insulin aspart  0-15 Units Subcutaneous TID WC  . insulin aspart  0-5 Units Subcutaneous QHS  . insulin aspart  8 Units Subcutaneous TID WC  . insulin glargine  45 Units Subcutaneous BID  . lisinopril  2.5 mg Oral Daily  . metoprolol tartrate  50 mg Oral BID  . mometasone-formoterol  2 puff Inhalation BID  . pantoprazole  40 mg Oral Daily  . traZODone  50 mg Oral QHS   Continuous Infusions: . lactated ringers 10 mL/hr at 01/25/20 0945  . magnesium sulfate bolus IVPB       LOS: 13 days   Time spent= 20 mins    Cylis Ayars Joline Maxcy, MD [facility]  If 7PM-7AM, please contact night-coverage  02/04/2020, 8:02 AM

## 2020-02-05 ENCOUNTER — Ambulatory Visit (INDEPENDENT_AMBULATORY_CARE_PROVIDER_SITE_OTHER): Payer: Medicare (Managed Care) | Admitting: Internal Medicine

## 2020-02-05 ENCOUNTER — Ambulatory Visit (HOSPITAL_COMMUNITY): Payer: Medicare (Managed Care) | Admitting: Physical Therapy

## 2020-02-05 LAB — GLUCOSE, CAPILLARY
Glucose-Capillary: 79 mg/dL (ref 70–99)
Glucose-Capillary: 133 mg/dL — ABNORMAL HIGH (ref 70–99)
Glucose-Capillary: 235 mg/dL — ABNORMAL HIGH (ref 70–99)
Glucose-Capillary: 188 mg/dL — ABNORMAL HIGH (ref 70–99)

## 2020-02-05 MED ORDER — DOCUSATE SODIUM 100 MG PO CAPS: 100.0000 mg | ORAL_CAPSULE | Freq: Two times a day (BID) | ORAL | 0 refills | Status: DC

## 2020-02-05 MED ORDER — SENNOSIDES-DOCUSATE SODIUM 8.6-50 MG PO TABS: 1.0000 | ORAL_TABLET | Freq: Every evening | ORAL | 0 refills | Status: DC | PRN

## 2020-02-05 MED ORDER — ACETAMINOPHEN 500 MG PO TABS: 500.0000 mg | ORAL_TABLET | Freq: Four times a day (QID) | ORAL | 0 refills | Status: DC | PRN

## 2020-02-05 MED ORDER — DULOXETINE HCL 30 MG PO CPEP: 30.0000 mg | ORAL_CAPSULE | Freq: Two times a day (BID) | ORAL | 0 refills | Status: DC

## 2020-02-05 MED ORDER — OXYCODONE HCL 5 MG PO CAPS: 5.0000 mg | ORAL_CAPSULE | ORAL | 0 refills | Status: DC | PRN

## 2020-02-05 MED ORDER — CELECOXIB 200 MG PO CAPS
200.0000 mg | ORAL_CAPSULE | Freq: Two times a day (BID) | ORAL | 0 refills | Status: DC
Start: 2020-02-05 — End: 2020-05-15

## 2020-02-05 NOTE — Progress Notes (Signed)
Occupational Therapy Treatment Patient Details Name: Nicole [name] MRN: [id number] DOB: [birth year] Today's Date: 02/05/2020    History of present illness Pt is a 54 y.o. female admitted 01/22/20 with worsening L foot infection and osteomyelitis. S/p L transtibial amputation 12/3. PMH includes COPD, HTN, DM, morbid obesity, CHF, CAD, afib on Eliquis.   OT comments  Pt progressing well towards OT goals. Guided pt in ADLs using lateral leans with decreased assist noted. Encouraged pt to do this at home to decrease fall risk as pt determined to be independent again. Pt overall Min A for stand pivot transfers using RW with posterior lean at times, benefits from cues for safety and pacing. Pt reports unsure if insurance will approve SNF rehab, so may be going home tomorrow. Collaborated on home setup, safety techniques during valued tasks and adaptive strategies. Encouraged pt to perform sponge bathing tasks until [facility] therapy follow-up at home (if unable to go to SNF) to maximize safety. Continue to recommend SNF for short term rehab to maximize independence/safety. If unable to go to SNF, recommend HHOT and 24/7 assistance.    Follow Up Recommendations  SNF    Equipment Recommendations  3 in 1 bedside commode;Wheelchair cushion (measurements OT);Wheelchair (measurements OT)    Recommendations for Other Services      Precautions / Restrictions Precautions Precautions: Fall Precaution Comments: 3L O2 Required Braces or Orthoses: Other Brace Other Brace: L BKA limb guard Restrictions Weight Bearing Restrictions: Yes LLE Weight Bearing: Non weight bearing       Mobility Bed Mobility               General bed mobility comments: received in recliner  Transfers Overall transfer level: Needs assistance Equipment used: Rolling walker (2 wheeled) Transfers: Sit to/from [employer] Sit to Stand: Min assist Stand pivot transfers: Min assist       General transfer  comment: Min A for power up from recliner and BSC, cues for hand placement needed. Pt Min A for pivot to BSC > recliner > bed with RW. Slight posterior bias and need for cues for pacing and safety    Balance Overall balance assessment: Needs assistance Sitting-balance support: Single extremity supported Sitting balance-Leahy Scale: Good     Standing balance support: Bilateral upper extremity supported;During functional activity Standing balance-Leahy Scale: Poor Standing balance comment: Reliant on BUE support for static standing balance                           ADL either performed or assessed with clinical judgement   ADL Overall ADL's : Needs assistance/impaired     Grooming: Set up;Sitting Grooming Details (indicate cue type and reason): Setup to wash face Upper Body Bathing: Set up;Sitting Upper Body Bathing Details (indicate cue type and reason): Setup for UB bathing seated in recliner Lower Body Bathing: Supervison/ safety;Sitting/lateral leans Lower Body Bathing Details (indicate cue type and reason): Supervision for LB bathing with lateral leans seated in recliner Upper Body Dressing : Set up;Sitting Upper Body Dressing Details (indicate cue type and reason): Setup to don gown seated in recliner   Lower Body Dressing Details (indicate cue type and reason): Able to demo ability to don limb protector with supervision, cues for best positioning Toilet Transfer: Minimal assistance;Stand-pivot;BSC;RW Toilet Transfer Details (indicate cue type and reason): MIn A for maintaining balance, slight posterior bias at times. cues for hand placement Toileting- Clothing Manipulation and Hygiene: Maximal assistance;Sit to/from stand Toileting -  Clothing Manipulation Details (indicate cue type and reason): Max A for peri care in standing, unable to reach on [facility] with lateral leans       General ADL Comments: Able to complete many ADls with lateral leans seated, increased assist  needed when standing due to balance deficits     Vision   Vision Assessment?: No apparent visual deficits   Perception     Praxis      Cognition Arousal/Alertness: Awake/alert Behavior During Therapy: WFL for tasks assessed/performed Overall Cognitive Status: Impaired/Different from baseline Area of Impairment: Safety/judgement;Problem solving                         Safety/Judgement: Decreased awareness of safety;Decreased awareness of deficits   Problem Solving: Requires verbal cues;Requires tactile cues;Difficulty sequencing General Comments: Cues for sequencing safely        Exercises     Shoulder Instructions       General Comments VSS on 3 L O2. Pt reports difficulty with insurance auth for SNF rehab, may be going home tomorrow. Collaborated on tub setup and transfer safety - recommended waiting until [facility] therapy follow up at home for tub transfer assessment. Educated lateral leans as best option for safety with ADLs and decreased assist. Pt hopes wheelchair will be small enough to fit through doorways at home    Pertinent Vitals/ Pain       Pain Assessment: 0-10 Pain Score: 5  Pain Location: L knee and incision Pain Descriptors / Indicators: Guarding Pain Intervention(s): Monitored during session;Patient requesting pain meds-RN notified;Repositioned  Home Living                                          Prior Functioning/Environment              Frequency  Min 2X/week        Progress Toward Goals  OT Goals(current goals can now be found in the care plan section)  Progress towards OT goals: Progressing toward goals  Acute Rehab OT Goals Patient Stated Goal: To get prosthetic and walk again OT Goal Formulation: With patient Time For Goal Achievement: 02/10/20 Potential to Achieve Goals: Good ADL Goals Pt Will Perform Lower Body Dressing: with min assist;sitting/lateral leans;sit to/from stand Pt Will Transfer to  Toilet: with mod assist;squat pivot transfer;bedside commode Pt Will Perform Toileting - Clothing Manipulation and hygiene: with mod assist;sitting/lateral leans;sit to/from stand Pt/caregiver will Perform Home Exercise Program: Increased strength;Both right and left upper extremity;With Supervision Additional ADL Goal #1: Pt will increase sitting tolerance x10 mins for ADL tasks with supervisionA.  Plan Discharge plan remains appropriate;Frequency remains appropriate    Co-evaluation                 AM-PAC OT "6 Clicks" Daily Activity     Outcome Measure   Help from another person eating meals?: None Help from another person taking care of personal grooming?: A Little Help from another person toileting, which includes using toliet, bedpan, or urinal?: A Lot Help from another person bathing (including washing, rinsing, drying)?: A Little Help from another person to put on and taking off regular upper body clothing?: A Little Help from another person to put on and taking off regular lower body clothing?: A Lot 6 Click Score: 17    End of Session Equipment Utilized During Treatment: Gait belt;Rolling  walker;Oxygen  OT Visit Diagnosis: Unsteadiness on feet (R26.81);Muscle weakness (generalized) (M62.81);Pain Pain - Right/Left: Left Pain - part of body: Leg   Activity Tolerance Patient tolerated treatment well   Patient Left in bed;with call bell/phone within reach;Other (comment) (sitting EOB, bed alarm malfunction - alarming with pt reaching to bag in chair. OT turned off and notified NT sitting EOB comfortably)   Nurse Communication Mobility status;Patient requests pain meds        Time: 1311-1402 OT Time Calculation (min): 51 min  Charges: OT General Charges $OT Visit: 1 Visit OT Treatments $Self Care/Home Management : 38-52 mins  Lorre Munroe, OTR/L   Lorre Munroe 02/05/2020, 2:16 PM

## 2020-02-05 NOTE — Progress Notes (Cosign Needed)
    Durable Medical Equipment  (From admission, onward)         Start     Ordered   02/05/20 0939  For home use only DME lightweight manual wheelchair with seat cushion  Once       Comments: Patient suffers from  L transtibial amputation which impairs their ability to perform daily activities like walking in the home.  A rolling walker will not resolve  issue with performing activities of daily living. A wheelchair will allow patient to safely perform daily activities. Patient is not able to propel themselves in the home using a standard weight wheelchair due to L transtibial amputation. Patient can self propel in the lightweight wheelchair. Length of need 12 months Accessories: elevating leg rests (ELRs), wheel locks, extensions and anti-tippers.   02/05/20 4982   02/05/20 0937  For home use only DME 3 n 1  Once        02/05/20 6415   02/05/20 0937  For home use only DME Walker rolling  Once       Question Answer Comment  Walker: With 5 Inch Wheels   Patient needs a walker to treat with the following condition Weakness      02/05/20 0942

## 2020-02-05 NOTE — Plan of Care (Signed)
  Problem: Education: Goal: Knowledge of General Education information will improve Description: Including pain rating scale, medication(s)/side effects and non-pharmacologic comfort measures Outcome: Progressing   Problem: Health Behavior/Discharge Planning: Goal: Ability to manage health-related needs will improve Outcome: Progressing   Problem: Nutrition: Goal: Adequate nutrition will be maintained Outcome: Progressing   Problem: Coping: Goal: Level of anxiety will decrease Outcome: Progressing   Problem: Safety: Goal: Ability to remain free from injury will improve Outcome: Progressing   Problem: Skin Integrity: Goal: Risk for impaired skin integrity will decrease Outcome: Progressing   

## 2020-02-05 NOTE — Progress Notes (Signed)
PROGRESS NOTE    Nicole [name]  OEV:[id number] DOB: April 09, 1965 DOA: 01/22/2020 PCP: [name], MD   Brief Narrative:  54 year old female with past medical history for COPD, dyslipidemia, hypertension, obesity class III, type 2 diabetes mellitus, coronary artery disease, diastolic heart failure, atrial fibrillation, and obstructive sleep apnea who presents with left foot pain.  She had a recent hospitalization 10/13-10/18 for left diabetic foot infection/cellulitis, received antibiotic therapy, and was discharged home with home health services.  Left foot radiograph with extensive soft tissue edema and subcutaneous emphysema, throughout the left foot compatible with infection.  Cortical irregularity lateral margin of the distal tuft first distal phalanx consistent with osteomyelitis.Patient was placed on broad-spectrum antibiotic therapy, orthopedics was consulted, and patient was transferred to [facility] for further surgical orthopedic intervention. Patient underwent left transtibial amputation on 12/03.  Patient is very deconditioned, awaiting to hear from insurance authorization for SNF placement.  If not patient is amenable to going home with home therapy tomorrow.   Assessment & Plan:   Principal Problem:   Sepsis due to Lt foot osteo Active Problems:   Coronary atherosclerosis of native coronary artery/Stent in 2006   Diabetes mellitus type 2 in obese (HCC)   HTN (hypertension), benign   COPD (chronic obstructive pulmonary disease) (HCC)   Diabetic ulcer of left foot (HCC)   Cellulitis of left foot   Osteomyelitis of left foot (HCC)   Hypokalemia   Hypoalbuminemia   Hyperglycemia due to diabetes mellitus (HCC)   Morbid obesity with body mass index (BMI) of 50.0 to 59.9 in adult [facility])   Tobacco abuse--2 P/day Smoker   Acute osteomyelitis of toe, left (HCC)     1.  Left foot osteomyelitis, diabetic foot infection, complicated with sepsis, present  on admission.    Patient underwent left transtibial amputation on 12/3.  PT recommended SNF, still awaiting insurance authorization.  Backup plans for home health   Patient has a wound VAC in place, will follow-up with surgery as an outpatient.   2.  Hypertension/coronary artery disease.  Continue blood pressure control with metoprolol and lisinopril.  Plavix resumed   3.  Uncontrolled type II diabetes mellitus, dyslipidemia.    Continue Lipitor.  Poorly controlled diabetes and blood glucose level seems to be improving.  Will Continue Lantus to 45 units twice daily, Premeal NovoLog to 8 units.  Continue sliding scale.   4.  Hypokalemia, hypomagnesemia, metabolic alkalosis.    Replete as necessary   5.  Obesity class III, severe sleep apnea.  Calculated BMI 51.3.  Continue CPAP at night and napping. Continue physical therapy.   6.  Chronic atrial fibrillation.  History of CAD status post stent  Continue rate control metoprolol, anticoagulation with apixaban.  Also on Plavix and statin.  Daily PPI.   7.  COPD, tobacco abuse.  No signs of acute exacerbation, continue Dulera.  Smoking cessation.   8.  Depression/insomnia.  Continue duloxetine and bupropion.  Trazadone.   Patient is amenable to going home if she does not hear from insurance in next 24 hours.  In the meantime TOC team aware to start making arrangements for home health.  Patient states she does not need any refill on her prescriptions unless her medications were changed.  DVT prophylaxis: SCDs Start: 01/25/20 1232 SCDs Start: 01/22/20 2023 apixaban (ELIQUIS) tablet 5 mg  Code Status: Full code Family Communication:   Status is: Inpatient  Remains inpatient appropriate because:Unsafe d/c plan  Dispo: The patient is from: Home              Anticipated d/c is to: SNF              Anticipated d/c date is: 1 day              Patient currently is medically stable to d/c.  Pending insurance authorization.  If is not received  over next 24 hours she will be going home with home health     Body mass index is 51.37 kg/m.    Subjective: No complaints feels okay sitting at the side of the bed.  Examination:  Constitutional: Not in acute distress, 3 L nasal cannula Respiratory: Clear to auscultation bilaterally Cardiovascular: Normal sinus rhythm, no rubs Abdomen: Nontender nondistended good bowel sounds Musculoskeletal: No edema noted Skin: No rashes seen Neurologic: CN 2-12 grossly intact.  And nonfocal Psychiatric: Normal judgment and insight. Alert and oriented x 3. Normal mood.  Objective: Vitals:   02/04/20 2101 02/05/20 0414 02/05/20 0859 02/05/20 1047  BP: (!) 117/54 121/60  124/63  Pulse: (!) 109 88  82  Resp: 18 17  18   Temp: 98.2 F (36.8 C) 97.8 F (36.6 C)  97.9 F (36.6 C)  TempSrc:    Oral  SpO2: 100% 98% 99% 100%  Weight:      Height:        Intake/Output Summary (Last 24 hours) at 02/05/2020 1125 Last data filed at 02/05/2020 1053 Gross per 24 hour  Intake 343.69 ml  Output 1100 ml  Net -756.31 ml   Filed Weights   01/22/20 1508  Weight: 131.5 kg     Data Reviewed:   CBC: No results for input(s): WBC, NEUTROABS, HGB, HCT, MCV, PLT in the last 168 hours. Basic Metabolic Panel: Recent Labs  Lab 01/30/20 0533 02/01/20 0413 02/04/20 0012 02/04/20 0613  NA  --  140 139  --   K  --  4.3 4.3  --   CL  --  100 97*  --   CO2  --  30 33*  --   GLUCOSE  --  228* 174*  --   BUN  --  14 18  --   CREATININE 0.73 0.73 0.95 0.87  CALCIUM  --  9.0 9.1  --   MG  --  1.6* 1.6*  --    GFR: Estimated Creatinine Clearance: 96.9 mL/min (by C-G formula based on SCr of 0.87 mg/dL). Liver Function Tests: No results for input(s): AST, ALT, ALKPHOS, BILITOT, PROT, ALBUMIN in the last 168 hours. No results for input(s): LIPASE, AMYLASE in the last 168 hours. No results for input(s): AMMONIA in the last 168 hours. Coagulation Profile: No results for input(s): INR, PROTIME in  the last 168 hours. Cardiac Enzymes: No results for input(s): CKTOTAL, CKMB, CKMBINDEX, TROPONINI in the last 168 hours. BNP (last 3 results) No results for input(s): PROBNP in the last 8760 hours. HbA1C: No results for input(s): HGBA1C in the last 72 hours. CBG: Recent Labs  Lab 02/04/20 0625 02/04/20 1151 02/04/20 1642 02/04/20 2057 02/05/20 0656  GLUCAP 157* 169* 133* 168* 79   Lipid Profile: No results for input(s): CHOL, HDL, LDLCALC, TRIG, CHOLHDL, LDLDIRECT in the last 72 hours. Thyroid Function Tests: No results for input(s): TSH, T4TOTAL, FREET4, T3FREE, THYROIDAB in the last 72 hours. Anemia Panel: No results for input(s): VITAMINB12, FOLATE, FERRITIN, TIBC, IRON, RETICCTPCT in the last 72 hours. Sepsis Labs: No results for input(s):  PROCALCITON, LATICACIDVEN in the last 168 hours.  No results found for this or any previous visit (from the past 240 hour(s)).       Radiology Studies: No results found.      Scheduled Meds: . acetaminophen  500 mg Oral Q6H  . apixaban  5 mg Oral BID  . atorvastatin  40 mg Oral Daily  . buPROPion  100 mg Oral BID  . celecoxib  200 mg Oral BID  . clopidogrel  75 mg Oral Daily  . docusate sodium  100 mg Oral BID  . DULoxetine  30 mg Oral BID  . feeding supplement (GLUCERNA SHAKE)  237 mL Oral BID  . insulin aspart  0-15 Units Subcutaneous TID WC  . insulin aspart  0-5 Units Subcutaneous QHS  . insulin aspart  8 Units Subcutaneous TID WC  . insulin glargine  45 Units Subcutaneous BID  . lisinopril  2.5 mg Oral Daily  . metoprolol tartrate  50 mg Oral BID  . mometasone-formoterol  2 puff Inhalation BID  . pantoprazole  40 mg Oral QAC breakfast  . traZODone  50 mg Oral QHS   Continuous Infusions: . lactated ringers 10 mL/hr at 01/25/20 0945     LOS: 14 days   Time spent= 20 mins    Saralee Bolick Joline Maxcy, MD [facility]  If 7PM-7AM, please contact night-coverage  02/05/2020, 11:25 AM

## 2020-02-06 LAB — GLUCOSE, CAPILLARY
Glucose-Capillary: 198 mg/dL — ABNORMAL HIGH (ref 70–99)
Glucose-Capillary: 146 mg/dL — ABNORMAL HIGH (ref 70–99)

## 2020-02-06 LAB — CREATININE, SERUM
Creatinine, Ser: 0.99 mg/dL (ref 0.44–1.00)
GFR, Estimated: >60 mL/min (ref >60)

## 2020-02-06 NOTE — Plan of Care (Signed)
Patient is alert and oriented x4. In no acute distress, pain managed with prn Oxycodone. Left stump incision intact without any drainage. Problem: Education: Goal: Knowledge of General Education information will improve Description: Including pain rating scale, medication(s)/side effects and non-pharmacologic comfort measures Outcome: Progressing   Problem: Health Behavior/Discharge Planning: Goal: Ability to manage health-related needs will improve Outcome: Progressing   Problem: Clinical Measurements: Goal: Ability to maintain clinical measurements within normal limits will improve Outcome: Progressing Goal: Will remain free from infection Outcome: Progressing Goal: Diagnostic test results will improve Outcome: Progressing Goal: Respiratory complications will improve Outcome: Progressing Goal: Cardiovascular complication will be avoided Outcome: Progressing   Problem: Activity: Goal: Risk for activity intolerance will decrease Outcome: Progressing   Problem: Nutrition: Goal: Adequate nutrition will be maintained Outcome: Progressing   Problem: Coping: Goal: Level of anxiety will decrease Outcome: Progressing   Problem: Elimination: Goal: Will not experience complications related to bowel motility Outcome: Progressing Goal: Will not experience complications related to urinary retention Outcome: Progressing   Problem: Pain Managment: Goal: General experience of comfort will improve Outcome: Progressing   Problem: Safety: Goal: Ability to remain free from injury will improve Outcome: Progressing   Problem: Skin Integrity: Goal: Risk for impaired skin integrity will decrease Outcome: Progressing

## 2020-02-06 NOTE — Progress Notes (Signed)
Patient continue to be medically stable for discharge. She has decided to go home with home health services.   VS: BP 130/71, HR 88, RR 16, 02 sat 100% on RA.  Lungs clear to auscultation, heart S1 and S2 present and rhythmic, abdomen soft and no right lower extremity edema. Left with transtibial amputation.   Plan for discharge home today with home health services.

## 2020-02-06 NOTE — TOC Transition Note (Signed)
Transition of Care [facility]) - CM/SW Discharge Note   Patient Details  Name: Nicole [name] MRN: [id number] Date of Birth: 1965/06/11  Transition of Care [facility] CM/SW Contact:  Epifanio Lesches, RN Phone Number: 02/06/2020, 11:39 AM   Clinical Narrative:    Patient will DC to: home Anticipated DC date: 02/06/2020 Family notified: daughter Transport by: car   Per MD patient ready for DC today.RN, patient, and patient's daughter aware of d/c. Pt agreeable to d/c to home with [facility] services. Pt without preference. Referral made with [facility] and accepted. DME: wheelchair will be delivered to bedside prior to d/c by [employer].   Pt without Rx med concerns or affordability issues.  Pt with noted post hospital f/u appointments.  RNCM will sign off for now as intervention is no longer needed. Please consult [place] again if new needs arise.    Final next level of care: Home w Home Health Services Barriers to Discharge: No Barriers Identified   Patient Goals and CMS Choice Patient states their goals for this hospitalization and ongoing recovery are:: to get better   Choice offered to / list presented to : Patient  Discharge Placement                       Discharge Plan and Services   Discharge Planning Services: CM Consult            DME Arranged: 3-N-1,Walker rolling,Wheelchair manual DME Agency: [employer] Date DME Agency Contacted: 02/05/20 Time DME Agency Contacted: [phone] Representative spoke with at DME Agency: Francesco Sor Arranged: PT,OT,Nurse's Aide [facility] Agency: [facility] Date [facility] Agency Contacted: 02/05/20 Time HH Agency Contacted: [phone] Representative spoke with at [facility] Agency: Kandee Keen  Social Determinants of Health (SDOH) Interventions     Readmission Risk Interventions No flowsheet data found.

## 2020-02-06 NOTE — Progress Notes (Signed)
:  Pt is A&O x4. Left BKA with dressing dry and intact, with stump shrinker on. Discharge instructions given to pt. Discharged to home accompanied by daughter. All of the DME's avail, wheelchair, walker and 3 in 1.

## 2020-02-06 NOTE — Progress Notes (Signed)
Physical Therapy Treatment Patient Details Name: Nicole [name] MRN: [id number] DOB: 1965/09/14 Today's Date: 02/06/2020    History of Present Illness Pt is a 54 y.o. female admitted 01/22/20 with worsening L foot infection and osteomyelitis. S/p L transtibial amputation 12/3. PMH includes COPD, HTN, DM, morbid obesity, CHF, CAD, afib on Eliquis.    PT Comments    Pt seated EOB on arrival, drowsy but agreeable to limited therapy session and with good participation. Pt performed seated LLE AROM therapeutic exercises with good tolerance, at times needing min tactile cues for technique/reps. HEP handout given (link: [email or web address] Access Code: [email or web address]) and encouraged compliance TID. Pt refused OOB mobility due to upcoming discharge and her drowsiness after taking pain meds but able to scoot anterior/posterior on bed with Supervision. Pt given verbal/visual instruction on safety with transfers and car transfer technique, pt verbalized understanding. Pt continues to benefit from PT services to progress toward functional mobility goals. D/C recs below remain appropriate although pt reports she is choosing to DC home with HHPT, pt would benefit from increased 24 hr supervision and physical assist for all mobility tasks due to increased fall risk.  Follow Up Recommendations  SNF     Equipment Recommendations  Rolling walker with 5" wheels;3in1 (PT);Wheelchair (measurements PT);Wheelchair cushion (measurements PT)    Recommendations for Other Services       Precautions / Restrictions Precautions Precautions: Fall Precaution Comments: 1L O2 [place] Required Braces or Orthoses: Other Brace Other Brace: L BKA limb guard Restrictions Weight Bearing Restrictions: Yes LLE Weight Bearing: Non weight bearing    Mobility  Bed Mobility               General bed mobility comments: received seated EOB  Transfers Overall transfer level: Needs assistance Equipment used: Rolling walker  (2 wheeled) Transfers: Licensed conveyancer transfers: Supervision   General transfer comment: pt deferred OOB mobility but agreeable to practice anterior/posterior scooting in preparation for car transfer; pt Supervision for technique  Ambulation/Gait                 Stairs             Wheelchair Mobility    Modified Rankin (Stroke Patients Only)       Balance Overall balance assessment: Needs assistance Sitting-balance support: Single extremity supported Sitting balance-Leahy Scale: Good Sitting balance - Comments: no LOB for static sitting and weight shifting, able to lean posterior for donning of limb guard at EOB without LOB                                    Cognition Arousal/Alertness: Awake/alert Behavior During Therapy: WFL for tasks assessed/performed Overall Cognitive Status: Impaired/Different from baseline Area of Impairment: Safety/judgement;Problem solving                   Current Attention Level: Selective     Safety/Judgement: Decreased awareness of safety;Decreased awareness of deficits Awareness: Anticipatory Problem Solving: Requires verbal cues;Requires tactile cues;Difficulty sequencing General Comments: Cues for sequencing safely      Exercises Amputee Exercises Quad Sets: AROM;Strengthening;Left;10 reps;Seated Hip Extension: AROM;Left;5 reps;Seated Hip ABduction/ADduction: AROM;Strengthening;Right;5 reps;Seated Hip Flexion/Marching: AROM;Left;Seated Knee Flexion: AROM;Strengthening;Left;10 reps;Seated Knee Extension: AROM;Strengthening;Left;10 reps;Seated    General Comments General comments (skin integrity, edema, etc.): VSS on 1L O2 [place], pt reports she wears O2 at home "sometimes"; pt needed minA for proper donning  of limb guard      Pertinent Vitals/Pain Pain Assessment: No/denies pain Pain Score: 0-No pain Pain Intervention(s): Monitored during  session;Premedicated before session   Vitals:   02/06/20 0813 02/06/20 0844  BP: 130/71   Pulse: 86   Resp: 17   Temp: 97.8 F (36.6 C)   SpO2: 100% 99%    Home Living                      Prior Function            PT Goals (current goals can now be found in the care plan section) Acute Rehab PT Goals Patient Stated Goal: To get prosthetic and walk again PT Goal Formulation: With patient Time For Goal Achievement: 02/15/20 Potential to Achieve Goals: Good Progress towards PT goals: Progressing toward goals    Frequency    Min 3X/week      PT Plan Current plan remains appropriate    Co-evaluation              AM-PAC PT "6 Clicks" Mobility   Outcome Measure  Help needed turning from your back to your side while in a flat bed without using bedrails?: None Help needed moving from lying on your back to sitting on the side of a flat bed without using bedrails?: A Little Help needed moving to and from a bed to a chair (including a wheelchair)?: A Little Help needed standing up from a chair using your arms (e.g., wheelchair or bedside chair)?: A Lot Help needed to walk in hospital room?: A Lot Help needed climbing 3-5 steps with a railing? : Total 6 Click Score: 15    End of Session Equipment Utilized During Treatment: Other (comment);Oxygen (LLE limb guard, donned during session after therex) Activity Tolerance: Patient limited by fatigue Patient left: in bed;with call bell/phone within reach;with bed alarm set (seated EOB with lunch tray in front preparing to eat, alarm on) Nurse Communication: Mobility status PT Visit Diagnosis: Unsteadiness on feet (R26.81);Other abnormalities of gait and mobility (R26.89);Repeated falls (R29.6);Muscle weakness (generalized) (M62.81)     Time: 4403-4742 PT Time Calculation (min) (ACUTE ONLY): 22 min  Charges:  $Therapeutic Exercise: 8-22 mins                     Narjis Mira P., PTA Acute Rehabilitation  Services Pager: [phone] Office: [phone]   Dorathy Kinsman Theordore Cisnero 02/06/2020, 1:12 PM

## 2020-02-07 ENCOUNTER — Ambulatory Visit (HOSPITAL_COMMUNITY): Payer: Medicare (Managed Care) | Admitting: Physical Therapy

## 2020-02-11 ENCOUNTER — Ambulatory Visit (HOSPITAL_COMMUNITY): Payer: Medicare (Managed Care) | Admitting: Physical Therapy

## 2020-02-13 ENCOUNTER — Ambulatory Visit (HOSPITAL_COMMUNITY): Payer: Medicare (Managed Care) | Admitting: Physical Therapy

## 2020-02-19 ENCOUNTER — Ambulatory Visit (HOSPITAL_COMMUNITY): Payer: Medicare (Managed Care) | Admitting: Physical Therapy

## 2020-02-19 ENCOUNTER — Inpatient Hospital Stay: Payer: Medicare (Managed Care) | Admitting: Physician Assistant

## 2020-02-20 ENCOUNTER — Other Ambulatory Visit: Payer: Self-pay

## 2020-02-20 ENCOUNTER — Ambulatory Visit (INDEPENDENT_AMBULATORY_CARE_PROVIDER_SITE_OTHER): Payer: Self-pay | Admitting: Physician Assistant

## 2020-02-20 ENCOUNTER — Encounter: Payer: Self-pay | Admitting: Physician Assistant

## 2020-02-20 VITALS — Ht 63.0 in | Wt 290.0 lb

## 2020-02-20 DIAGNOSIS — M86172 Other acute osteomyelitis, left ankle and foot: Secondary | ICD-10-CM

## 2020-02-20 NOTE — Progress Notes (Signed)
Office Visit Note   Patient: Nicole [name]           Date of Birth: 07-25-1965           MRN: [id number] Visit Date: 02/20/2020              Requested by: [name], MD [address] [place],  [place] [address] PCP: [name], MD  Chief Complaint  Patient presents with  . Left Leg - Routine Post Op    01/25/20 left BKA ( 1st post op appt)       HPI: Patient presents today 4 weeks status post left below-knee amputation.  She is here for her first appointment.  She has been wearing a shrinker and a limb protector  Assessment & Plan: Visit Diagnoses: No diagnosis found.  Plan: Doing quite well and given her prescription for a smaller shrinker.  Also given her prescription to begin having her prosthetic made follow-up with [place] in 4 weeks  Follow-Up Instructions: No follow-ups on file.   Ortho Exam  Patient is alert, oriented, no adenopathy, well-dressed, normal affect, normal respiratory effort. Left below-knee amputation well apposed wound edges no cellulitis no drainage.  Wound is healed no necrosis surgical staples are in place  Imaging: No results found.   Labs: Lab Results  Component Value Date   HGBA1C 9.6 (H) 12/25/2019   HGBA1C 6.9 (H) 04/10/2012   HGBA1C 8.5 (H) 02/19/2011   ESRSEDRATE 67 (H) 12/05/2019   CRP 8.7 (H) 12/05/2019   REPTSTATUS 01/27/2020 FINAL 01/22/2020   GRAMSTAIN  02/19/2011    ABUNDANT WBC PRESENT,BOTH PMN AND MONONUCLEAR RARE SQUAMOUS EPITHELIAL CELLS PRESENT FEW GRAM POSITIVE COCCI IN PAIRS RARE GRAM NEGATIVE RODS RARE GRAM POSITIVE RODS   CULT  01/22/2020    NO GROWTH 5 DAYS Performed at [facility], [address] [place], [place] [address]      Lab Results  Component Value Date   ALBUMIN 2.5 (L) 01/23/2020   ALBUMIN 2.5 (L) 01/22/2020   ALBUMIN 2.9 (L) 12/06/2019   PREALBUMIN 17.9 (L) 12/05/2019    Lab Results  Component Value Date   MG 1.6 (L) 02/04/2020   MG 1.6 (L) 02/01/2020   MG 1.6 (L) 01/23/2020    No results found for: VD25OH  Lab Results  Component Value Date   PREALBUMIN 17.9 (L) 12/05/2019   CBC EXTENDED Latest Ref Rng & Units 01/29/2020 01/27/2020 01/26/2020  WBC 4.0 - 10.5 K/uL 14.2(H) 15.2(H) 19.0(H)  RBC 3.87 - 5.11 MIL/uL 3.54(L) 3.45(L) 3.72(L)  HGB 12.0 - 15.0 g/dL 10.3(L) 10.4(L) 11.0(L)  HCT 36.0 - 46.0 % 34.0(L) 33.3(L) 36.5  PLT 150 - 400 K/uL 347 363 356  NEUTROABS 1.7 - 7.7 K/uL 9.7(H) 11.0(H) 14.2(H)  LYMPHSABS 0.7 - 4.0 K/uL 2.9 2.6 2.7     Body mass index is 51.37 kg/m.  Orders:  No orders of the defined types were placed in this encounter.  No orders of the defined types were placed in this encounter.    Procedures: No procedures performed  Clinical Data: No additional findings.  ROS:  All other systems negative, except as noted in the HPI. Review of Systems  Objective: Vital Signs: Ht 5\' 3"  (1.6 m)   Wt 290 lb (131.5 kg)   BMI 51.37 kg/m   Specialty Comments:  No specialty comments available.  PMFS History: Patient Active Problem List   Diagnosis Date Noted  . Acute osteomyelitis of toe, left (HCC)   .  Sepsis due to Lt foot osteo 01/23/2020  . Morbid obesity with body mass index (BMI) of 50.0 to 59.9 in adult [facility]) 01/23/2020  . Tobacco abuse--2 P/day Smoker 01/23/2020  . Cellulitis of left foot 01/22/2020  . Osteomyelitis of left foot (HCC) 01/22/2020  . Leukocytosis 01/22/2020  . Hypokalemia 01/22/2020  . Hypoalbuminemia 01/22/2020  . Hyperglycemia due to diabetes mellitus (HCC) 01/22/2020  . Elevated alpha fetoprotein 01/22/2020  . SIRS (systemic inflammatory response syndrome) (HCC) 01/22/2020  . Diabetic ulcer of left foot (HCC) 12/05/2019  . Cough 05/26/2012  . TIA (transient ischemic attack) 04/10/2012  . Acute diastolic congestive heart failure (HCC) 11/12/2011  . COPD (chronic obstructive pulmonary disease) (HCC) 11/09/2011  . Dyspnea 06/10/2011  . CAP (community acquired pneumonia) 02/19/2011  . Coronary  atherosclerosis of native coronary artery/Stent in 2006 02/19/2011  . Dyslipidemia   . Morbid obesity (HCC)   . Diabetes mellitus type 2 in obese (HCC)   . HTN (hypertension), benign    Past Medical History:  Diagnosis Date  . CAP (community acquired pneumonia)    Streptococcus 01/2011  . COPD (chronic obstructive pulmonary disease) (HCC)   . Coronary atherosclerosis of native coronary artery    Diagnosed [place] 2006 - DES RCA, reports followup cath [facility]   . Dyslipidemia   . Essential hypertension, benign   . Morbid obesity (HCC)   . Pancreatitis   . Type 2 diabetes mellitus (HCC)     Family History  Problem Relation Age of Onset  . Diabetes Mother   . Heart failure Mother   . Hypertension Mother   . Kidney failure Mother   . Ovarian cancer Mother   . Asthma Son     Past Surgical History:  Procedure Laterality Date  . ABDOMINAL HERNIA REPAIR    . ABDOMINAL HYSTERECTOMY    . AMPUTATION Left 01/25/2020   Procedure: LEFT BELOW KNEE AMPUTATION;  Surgeon: [name], MD;  Location: [facility] OR;  Service: Orthopedics;  Laterality: Left;  . CORONARY ANGIOPLASTY WITH STENT PLACEMENT  2006  . KNEE SURGERY    . NOSE SURGERY    . Pilonidal cystectomy    . TONSILLECTOMY     Social History   Occupational History  . Occupation: Home health aide    Employer: [employer]  Tobacco Use  . Smoking status: Current Every Day Smoker    Packs/day: 0.50    Years: 20.00    Pack years: 10.00    Types: Cigarettes  . Smokeless tobacco: Never Used  Vaping Use  . Vaping Use: Never used  Substance and Sexual Activity  . Alcohol use: No  . Drug use: No  . Sexual activity: Yes    Birth control/protection: Surgical

## 2020-02-21 ENCOUNTER — Ambulatory Visit (HOSPITAL_COMMUNITY): Payer: Medicare (Managed Care) | Admitting: Physical Therapy

## 2020-03-06 ENCOUNTER — Ambulatory Visit: Payer: Medicare (Managed Care) | Admitting: Nurse Practitioner

## 2020-03-06 NOTE — Patient Instructions (Incomplete)
Diabetes Mellitus and Nutrition, Adult When you have diabetes, or diabetes mellitus, it is very important to have healthy eating habits because your blood sugar (glucose) levels are greatly affected by what you eat and drink. Eating healthy foods in the right amounts, at about the same times every day, can help you:  Control your blood glucose.  Lower your risk of heart disease.  Improve your blood pressure.  Reach or maintain a healthy weight. What can affect my meal plan? Every person with diabetes is different, and each person has different needs for a meal plan. Your health care provider may recommend that you work with a dietitian to make a meal plan that is best for you. Your meal plan may vary depending on factors such as:  The calories you need.  The medicines you take.  Your weight.  Your blood glucose, blood pressure, and cholesterol levels.  Your activity level.  Other health conditions you have, such as heart or kidney disease. How do carbohydrates affect me? Carbohydrates, also called carbs, affect your blood glucose level more than any other type of food. Eating carbs naturally raises the amount of glucose in your blood. Carb counting is a method for keeping track of how many carbs you eat. Counting carbs is important to keep your blood glucose at a healthy level, especially if you use insulin or take certain oral diabetes medicines. It is important to know how many carbs you can safely have in each meal. This is different for every person. Your dietitian can help you calculate how many carbs you should have at each meal and for each snack. How does alcohol affect me? Alcohol can cause a sudden decrease in blood glucose (hypoglycemia), especially if you use insulin or take certain oral diabetes medicines. Hypoglycemia can be a life-threatening condition. Symptoms of hypoglycemia, such as sleepiness, dizziness, and confusion, are similar to symptoms of having too much  alcohol.  Do not drink alcohol if: ? Your health care provider tells you not to drink. ? You are pregnant, may be pregnant, or are planning to become pregnant.  If you drink alcohol: ? Do not drink on an empty stomach. ? Limit how much you use to:  0-1 drink a day for women.  0-2 drinks a day for men. ? Be aware of how much alcohol is in your drink. In the [place] one drink equals one 12 oz bottle of beer (355 mL), one 5 oz glass of wine (148 mL), or one 1 oz glass of hard liquor (44 mL). ? Keep yourself hydrated with water, diet soda, or unsweetened iced tea.  Keep in mind that regular soda, juice, and other mixers may contain a lot of sugar and must be counted as carbs. What are tips for following this plan? Reading food labels  Start by checking the serving size on the "Nutrition Facts" label of packaged foods and drinks. The amount of calories, carbs, fats, and other nutrients listed on the label is based on one serving of the item. Many items contain more than one serving per package.  Check the total grams (g) of carbs in one serving. You can calculate the number of servings of carbs in one serving by dividing the total carbs by 15. For example, if a food has 30 g of total carbs per serving, it would be equal to 2 servings of carbs.  Check the number of grams (g) of saturated fats and trans fats in one serving. Choose foods that have   a low amount or none of these fats.  Check the number of milligrams (mg) of salt (sodium) in one serving. Most people should limit total sodium intake to less than 2,300 mg per day.  Always check the nutrition information of foods labeled as "low-fat" or "nonfat." These foods may be higher in added sugar or refined carbs and should be avoided.  Talk to your dietitian to identify your daily goals for nutrients listed on the label. Shopping  Avoid buying canned, pre-made, or processed foods. These foods tend to be high in fat, sodium, and added  sugar.  Shop around the outside edge of the grocery store. This is where you will most often find fresh fruits and vegetables, bulk grains, fresh meats, and fresh dairy. Cooking  Use low-heat cooking methods, such as baking, instead of high-heat cooking methods like deep frying.  Cook using healthy oils, such as olive, canola, or sunflower oil.  Avoid cooking with butter, cream, or high-fat meats. Meal planning  Eat meals and snacks regularly, preferably at the same times every day. Avoid going long periods of time without eating.  Eat foods that are high in fiber, such as fresh fruits, vegetables, beans, and whole grains. Talk with your dietitian about how many servings of carbs you can eat at each meal.  Eat 4-6 oz (112-168 g) of lean protein each day, such as lean meat, chicken, fish, eggs, or tofu. One ounce (oz) of lean protein is equal to: ? 1 oz (28 g) of meat, chicken, or fish. ? 1 egg. ?  cup (62 g) of tofu.  Eat some foods each day that contain healthy fats, such as avocado, nuts, seeds, and fish.   What foods should I eat? Fruits Berries. Apples. Oranges. Peaches. Apricots. Plums. Grapes. Mango. Papaya. Pomegranate. Kiwi. Cherries. Vegetables Lettuce. Spinach. Leafy greens, including kale, chard, collard greens, and mustard greens. Beets. Cauliflower. Cabbage. Broccoli. Carrots. Green beans. Tomatoes. Peppers. Onions. Cucumbers. Brussels sprouts. Grains Whole grains, such as whole-wheat or whole-grain bread, crackers, tortillas, cereal, and pasta. Unsweetened oatmeal. Quinoa. Brown or wild rice. Meats and other proteins Seafood. Poultry without skin. Lean cuts of poultry and beef. Tofu. Nuts. Seeds. Dairy Low-fat or fat-free dairy products such as milk, yogurt, and cheese. The items listed above may not be a complete list of foods and beverages you can eat. Contact a dietitian for more information. What foods should I avoid? Fruits Fruits canned with  syrup. Vegetables Canned vegetables. Frozen vegetables with butter or cream sauce. Grains Refined white flour and flour products such as bread, pasta, snack foods, and cereals. Avoid all processed foods. Meats and other proteins Fatty cuts of meat. Poultry with skin. Breaded or fried meats. Processed meat. Avoid saturated fats. Dairy Full-fat yogurt, cheese, or milk. Beverages Sweetened drinks, such as soda or iced tea. The items listed above may not be a complete list of foods and beverages you should avoid. Contact a dietitian for more information. Questions to ask a health care provider  Do I need to meet with a diabetes educator?  Do I need to meet with a dietitian?  What number can I call if I have questions?  When are the best times to check my blood glucose? Where to find more information:  American Diabetes Association: diabetes.org  Academy of Nutrition and Dietetics: www.eatright.org  National Institute of Diabetes and Digestive and Kidney Diseases: www.niddk.nih.gov  Association of Diabetes Care and Education Specialists: www.diabeteseducator.org Summary  It is important to have healthy eating   habits because your blood sugar (glucose) levels are greatly affected by what you eat and drink.  A healthy meal plan will help you control your blood glucose and maintain a healthy lifestyle.  Your health care provider may recommend that you work with a dietitian to make a meal plan that is best for you.  Keep in mind that carbohydrates (carbs) and alcohol have immediate effects on your blood glucose levels. It is important to count carbs and to use alcohol carefully. This information is not intended to replace advice given to you by your health care provider. Make sure you discuss any questions you have with your health care provider. Document Revised: 01/16/2019 Document Reviewed: 01/16/2019 Elsevier Patient Education  2021 [employer]  

## 2020-03-13 ENCOUNTER — Ambulatory Visit (INDEPENDENT_AMBULATORY_CARE_PROVIDER_SITE_OTHER): Payer: Medicare (Managed Care) | Admitting: Internal Medicine

## 2020-03-19 ENCOUNTER — Encounter: Payer: Self-pay | Admitting: Physician Assistant

## 2020-03-19 ENCOUNTER — Ambulatory Visit (INDEPENDENT_AMBULATORY_CARE_PROVIDER_SITE_OTHER): Payer: Medicare Other | Admitting: Physician Assistant

## 2020-03-19 DIAGNOSIS — M86172 Other acute osteomyelitis, left ankle and foot: Secondary | ICD-10-CM

## 2020-03-19 NOTE — Progress Notes (Signed)
Office Visit Note   Patient: Nicole [name]           Date of Birth: Apr 25, 1965           MRN: [id number] Visit Date: 03/19/2020              Requested by: [name], MD [address] [place],  [place] [address] PCP: [name], MD  No chief complaint on file.     HPI: Patient is here for follow-up she is 7 weeks status post transtibial amputation. She was seen in the emergency room after she fell and bumped her stump as well as had a slight bump on her head. She was cleared by the emergency room. She has been working with Technical sales engineer to decrease the swelling in her stump so she can be fitted with a prosthetic  Assessment & Plan: Visit Diagnoses: No diagnosis found.  Plan: Will elevate and wear her shrinkers as much as possible. Follow-up in 3 weeks.  Follow-Up Instructions: No follow-ups on file.   Ortho Exam  Patient is alert, oriented, no adenopathy, well-dressed, normal affect, normal respiratory effort. Amputation stump has moderate soft tissue swelling but well apposed wound edges with no dehiscence. No cellulitis. She does have some tenderness to palpation where she hit her stump. She has good knee range of motion without any difficulty no evidence of any infection  Imaging: No results found. No images are attached to the encounter.  Labs: Lab Results  Component Value Date   HGBA1C 9.6 (H) 12/25/2019   HGBA1C 6.9 (H) 04/10/2012   HGBA1C 8.5 (H) 02/19/2011   ESRSEDRATE 67 (H) 12/05/2019   CRP 8.7 (H) 12/05/2019   REPTSTATUS 01/27/2020 FINAL 01/22/2020   GRAMSTAIN  02/19/2011    ABUNDANT WBC PRESENT,BOTH PMN AND MONONUCLEAR RARE SQUAMOUS EPITHELIAL CELLS PRESENT FEW GRAM POSITIVE COCCI IN PAIRS RARE GRAM NEGATIVE RODS RARE GRAM POSITIVE RODS   CULT  01/22/2020    NO GROWTH 5 DAYS Performed at [facility], [address], [place], [place] [address]      Lab Results  Component Value Date   ALBUMIN 2.5 (L) 01/23/2020   ALBUMIN 2.5 (L) 01/22/2020    ALBUMIN 2.9 (L) 12/06/2019   PREALBUMIN 17.9 (L) 12/05/2019    Lab Results  Component Value Date   MG 1.6 (L) 02/04/2020   MG 1.6 (L) 02/01/2020   MG 1.6 (L) 01/23/2020   No results found for: VD25OH  Lab Results  Component Value Date   PREALBUMIN 17.9 (L) 12/05/2019   CBC EXTENDED Latest Ref Rng & Units 01/29/2020 01/27/2020 01/26/2020  WBC 4.0 - 10.5 K/uL 14.2(H) 15.2(H) 19.0(H)  RBC 3.87 - 5.11 MIL/uL 3.54(L) 3.45(L) 3.72(L)  HGB 12.0 - 15.0 g/dL 10.3(L) 10.4(L) 11.0(L)  HCT 36.0 - 46.0 % 34.0(L) 33.3(L) 36.5  PLT 150 - 400 K/uL 347 363 356  NEUTROABS 1.7 - 7.7 K/uL 9.7(H) 11.0(H) 14.2(H)  LYMPHSABS 0.7 - 4.0 K/uL 2.9 2.6 2.7     There is no height or weight on file to calculate BMI.  Orders:  No orders of the defined types were placed in this encounter.  No orders of the defined types were placed in this encounter.    Procedures: No procedures performed  Clinical Data: No additional findings.  ROS:  All other systems negative, except as noted in the HPI. Review of Systems  Objective: Vital Signs: There were no vitals taken for this visit.  Specialty Comments:  No specialty comments available.  PMFS History: Patient Active Problem List   Diagnosis Date Noted  . Acute osteomyelitis of toe, left (HCC)   . Sepsis due to Lt foot osteo 01/23/2020  . Morbid obesity with body mass index (BMI) of 50.0 to 59.9 in adult [facility]) 01/23/2020  . Tobacco abuse--2 P/day Smoker 01/23/2020  . Cellulitis of left foot 01/22/2020  . Osteomyelitis of left foot (HCC) 01/22/2020  . Leukocytosis 01/22/2020  . Hypokalemia 01/22/2020  . Hypoalbuminemia 01/22/2020  . Hyperglycemia due to diabetes mellitus (HCC) 01/22/2020  . Elevated alpha fetoprotein 01/22/2020  . SIRS (systemic inflammatory response syndrome) (HCC) 01/22/2020  . Diabetic ulcer of left foot (HCC) 12/05/2019  . Cough 05/26/2012  . TIA (transient ischemic attack) 04/10/2012  . Acute diastolic congestive  heart failure (HCC) 11/12/2011  . COPD (chronic obstructive pulmonary disease) (HCC) 11/09/2011  . Dyspnea 06/10/2011  . CAP (community acquired pneumonia) 02/19/2011  . Coronary atherosclerosis of native coronary artery/Stent in 2006 02/19/2011  . Dyslipidemia   . Morbid obesity (HCC)   . Diabetes mellitus type 2 in obese (HCC)   . HTN (hypertension), benign    Past Medical History:  Diagnosis Date  . CAP (community acquired pneumonia)    Streptococcus 01/2011  . COPD (chronic obstructive pulmonary disease) (HCC)   . Coronary atherosclerosis of native coronary artery    Diagnosed [place] 2006 - DES RCA, reports followup cath 2009 at [facility]   . Dyslipidemia   . Essential hypertension, benign   . Morbid obesity (HCC)   . Pancreatitis   . Type 2 diabetes mellitus (HCC)     Family History  Problem Relation Age of Onset  . Diabetes Mother   . Heart failure Mother   . Hypertension Mother   . Kidney failure Mother   . Ovarian cancer Mother   . Asthma Son     Past Surgical History:  Procedure Laterality Date  . ABDOMINAL HERNIA REPAIR    . ABDOMINAL HYSTERECTOMY    . AMPUTATION Left 01/25/2020   Procedure: LEFT BELOW KNEE AMPUTATION;  Surgeon: [name], MD;  Location: [facility] OR;  Service: Orthopedics;  Laterality: Left;  . CORONARY ANGIOPLASTY WITH STENT PLACEMENT  2006  . KNEE SURGERY    . NOSE SURGERY    . Pilonidal cystectomy    . TONSILLECTOMY     Social History   Occupational History  . Occupation: Home health aide    Employer: [employer]  Tobacco Use  . Smoking status: Current Every Day Smoker    Packs/day: 0.50    Years: 20.00    Pack years: 10.00    Types: Cigarettes  . Smokeless tobacco: Never Used  Vaping Use  . Vaping Use: Never used  Substance and Sexual Activity  . Alcohol use: No  . Drug use: No  . Sexual activity: Yes    Birth control/protection: Surgical

## 2020-03-31 ENCOUNTER — Ambulatory Visit (INDEPENDENT_AMBULATORY_CARE_PROVIDER_SITE_OTHER): Payer: Medicare (Managed Care) | Admitting: Gastroenterology

## 2020-04-01 NOTE — Progress Notes (Deleted)
CARDIOLOGY CONSULT NOTE       Patient ID: Nicole [name] MRN: [id number] DOB/AGE: [birth year] 56 y.o.  Admit date: (Not on file) Referring Physician: [name] Primary Physician: [name], MD Primary Cardiologist: New Reason for Consultation: CAD  Active Problems:   * No active hospital problems. *   HPI:  55 y.o. morbidly obese, HLD, DM-2, HTN, History of CAD few records available. ? DES to RCA in 2006 Patent by f/u cath [facility] 2009. Had left below knee amputation by Dr Lajoyce Corners on 01/25/20 for osteomyelitis Previously seen by Dr Diona Browner and [facility] in 2013 Lexiscan myovue done 06/30/11 normal no ischemia or infarct EF 64% Echo done 04/06/11 was also normal with EF 60% and no valve disease Established care with Jiles Prows NP with [name] 12/25/19 They noted poorly controlled DM with A1c10-12 and LE edema with cellulitis despite lasix She has history of PAF and is on abixaban in addition to plavix She needs psychiatric f/u for significant depression  ***  ROS All other systems reviewed and negative except as noted above  Past Medical History:  Diagnosis Date  . CAP (community acquired pneumonia)    Streptococcus 01/2011  . COPD (chronic obstructive pulmonary disease) (HCC)   . Coronary atherosclerosis of native coronary artery    Diagnosed [place] 2006 - DES RCA, reports followup cath 2009 at [facility]   . Dyslipidemia   . Essential hypertension, benign   . Morbid obesity (HCC)   . Pancreatitis   . Type 2 diabetes mellitus (HCC)     Family History  Problem Relation Age of Onset  . Diabetes Mother   . Heart failure Mother   . Hypertension Mother   . Kidney failure Mother   . Ovarian cancer Mother   . Asthma Son     Social History   Socioeconomic History  . Marital status: Married    Spouse name: Not on file  . Number of children: 3  . Years of education: Not on file  . Highest education level: Not on file  Occupational History  . Occupation:  Home health aide    Employer: [employer]  Tobacco Use  . Smoking status: Current Every Day Smoker    Packs/day: 0.50    Years: 20.00    Pack years: 10.00    Types: Cigarettes  . Smokeless tobacco: Never Used  Vaping Use  . Vaping Use: Never used  Substance and Sexual Activity  . Alcohol use: No  . Drug use: No  . Sexual activity: Yes    Birth control/protection: Surgical  Other Topics Concern  . Not on file  Social History Narrative  . Not on file   Social Determinants of Health   Financial Resource Strain: Not on file  Food Insecurity: Not on file  Transportation Needs: Not on file  Physical Activity: Not on file  Stress: Not on file  Social Connections: Not on file  Intimate Partner Violence: Not on file    Past Surgical History:  Procedure Laterality Date  . ABDOMINAL HERNIA REPAIR    . ABDOMINAL HYSTERECTOMY    . AMPUTATION Left 01/25/2020   Procedure: LEFT BELOW KNEE AMPUTATION;  Surgeon: [name], MD;  Location: [facility] OR;  Service: Orthopedics;  Laterality: Left;  . CORONARY ANGIOPLASTY WITH STENT PLACEMENT  2006  . KNEE SURGERY    . NOSE SURGERY    . Pilonidal cystectomy    . TONSILLECTOMY  Current Outpatient Medications:  .  acetaminophen (TYLENOL) 500 MG tablet, Take 1 tablet (500 mg total) by mouth every 6 (six) hours as needed for moderate pain., Disp: 30 tablet, Rfl: 0 .  albuterol (PROVENTIL) (2.5 MG/3ML) 0.083% nebulizer solution, Take 3 mLs (2.5 mg total) by nebulization every 6 (six) hours as needed for wheezing., Disp: 75 mL, Rfl: 12 .  albuterol (VENTOLIN HFA) 108 (90 BASE) MCG/ACT inhaler, Inhale 2 puffs into the lungs as needed for wheezing. Shortness of breath, Disp: , Rfl:  .  apixaban (ELIQUIS) 5 MG TABS tablet, Take 5 mg by mouth 2 (two) times daily., Disp: , Rfl:  .  buPROPion (WELLBUTRIN SR) 100 MG 12 hr tablet, Take 1 tablet (100 mg total) by mouth 2 (two) times daily., Disp: 180 tablet, Rfl: 0 .  celecoxib  (CELEBREX) 200 MG capsule, Take 1 capsule (200 mg total) by mouth 2 (two) times daily., Disp: 14 capsule, Rfl: 0 .  citalopram (CELEXA) 10 MG tablet, Take 10 mg by mouth daily., Disp: , Rfl:  .  clopidogrel (PLAVIX) 75 MG tablet, Take 1 tablet (75 mg total) by mouth daily., Disp: 90 tablet, Rfl: 0 .  docusate sodium (COLACE) 100 MG capsule, Take 1 capsule (100 mg total) by mouth 2 (two) times daily., Disp: 60 capsule, Rfl: 0 .  DULoxetine (CYMBALTA) 30 MG capsule, Take 1 capsule (30 mg total) by mouth 2 (two) times daily., Disp: 60 capsule, Rfl: 0 .  EASY TOUCH INSULIN SYRINGE 31G X 5/16" 1 ML MISC, , Disp: , Rfl:  .  EPINEPHrine 0.3 mg/0.3 mL IJ SOAJ injection, Inject 0.3 mg into the muscle as needed for anaphylaxis., Disp: , Rfl:  .  furosemide (LASIX) 80 MG tablet, Take 1 tablet (80 mg total) by mouth daily., Disp: 90 tablet, Rfl: 0 .  glipiZIDE (GLUCOTROL) 10 MG tablet, Take 1 tablet (10 mg total) by mouth in the morning and at bedtime., Disp: 180 tablet, Rfl: 0 .  insulin aspart (NOVOLOG) 100 UNIT/ML injection, Inject 0-15 Units into the skin every 4 (four) hours. For glucose 121 to 150 use 2 units, for 151 to 200 use 3 units, for 201 to 250 use 5 units, for 251 to 300 use 8 units, for 301 to 350 use 11 units for 351 or greater use 15 units., Disp: 10 mL, Rfl: 11 .  insulin detemir (LEVEMIR) 100 UNIT/ML injection, Inject 0.4 mLs (40 Units total) into the skin 2 (two) times daily., Disp: 10 mL, Rfl: 11 .  lisinopril (ZESTRIL) 2.5 MG tablet, Take 2.5 mg by mouth daily., Disp: , Rfl:  .  loperamide (IMODIUM) 2 MG capsule, Take 1 capsule (2 mg total) by mouth every 6 (six) hours as needed for diarrhea or loose stools., Disp: 30 capsule, Rfl: 0 .  metoprolol tartrate (LOPRESSOR) 50 MG tablet, Take 1 tablet (50 mg total) by mouth 2 (two) times daily., Disp: 180 tablet, Rfl: 0 .  Multiple Vitamins-Calcium (ONE-A-DAY WOMENS FORMULA) TABS, Take 1 tablet by mouth daily., Disp: , Rfl:  .  mupirocin ointment  (BACTROBAN) 2 %, Apply topically daily., Disp: 22 g, Rfl: 0 .  oxycodone (OXY-IR) 5 MG capsule, Take 1 capsule (5 mg total) by mouth every 4 (four) hours as needed for pain., Disp: 30 capsule, Rfl: 0 .  pantoprazole (PROTONIX) 40 MG tablet, Take 1 tablet (40 mg total) by mouth daily. Take 30-60 min before first meal of the day, Disp: 90 tablet, Rfl: 0 .  senna-docusate (SENOKOT-S) 8.6-50 MG  tablet, Take 1 tablet by mouth at bedtime as needed for mild constipation., Disp: 30 tablet, Rfl: 0 .  simvastatin (ZOCOR) 80 MG tablet, Take 80 mg by mouth daily., Disp: , Rfl:  .  TRULICITY 1.5 MG/0.5ML SOPN, Inject 1.5 mg into the skin once a week. , Disp: , Rfl:     Physical Exam: There were no vitals taken for this visit.    Affect appropriate Chronically ill obese female  HEENT: normal Neck supple with no adenopathy JVP normal no bruits no thyromegaly Lungs clear with no wheezing and good diaphragmatic motion Heart:  S1/S2 no murmur, no rub, gallop or click PMI normal Abdomen: benighn, BS positve, no tenderness, no AAA no bruit.  No HSM or HJR Distal pulses intact with no bruits No edema Neuro non-focal Left BKA   Labs:   Lab Results  Component Value Date   WBC 14.2 (H) 01/29/2020   HGB 10.3 (L) 01/29/2020   HCT 34.0 (L) 01/29/2020   MCV 96.0 01/29/2020   PLT 347 01/29/2020   No results for input(s): NA, K, CL, CO2, BUN, CREATININE, CALCIUM, PROT, BILITOT, ALKPHOS, ALT, AST, GLUCOSE in the last 168 hours.  Invalid input(s): LABALBU Lab Results  Component Value Date   CKTOTAL 251 (H) 02/18/2011   CKMB 2.3 02/18/2011   TROPONINI <0.30 05/25/2012    Lab Results  Component Value Date   CHOL 117 12/25/2019   CHOL 191 04/11/2012   Lab Results  Component Value Date   HDL 25 (L) 12/25/2019   HDL 28 (L) 04/11/2012   Lab Results  Component Value Date   LDLCALC 65 12/25/2019   LDLCALC 112 (H) 04/11/2012   Lab Results  Component Value Date   TRIG 208 (H) 12/25/2019   TRIG  257 (H) 04/11/2012   Lab Results  Component Value Date   CHOLHDL 4.7 12/25/2019   CHOLHDL 6.8 04/11/2012   No results found for: LDLDIRECT    Radiology: No results found.  EKG: ***   ASSESSMENT AND PLAN:   1. CAD:  Distant stent to RCA. Normal myovue 2013 *** 2. HTN:  Well controlled.  Continue current medications and low sodium Dash type diet.   3. DM: Discussed low carb diet.  Target hemoglobin A1c is 6.5 or less.  Continue current medications. 4. HLD:  Continue statin labs with primary  5. PAF: Distant history on lopressor and eliquis *** 6. COPD:  Discussed smoking cessation ***  ***  Signed: Charlton Haws 04/01/2020, 10:24 AM

## 2020-04-08 DIAGNOSIS — A419 Sepsis, unspecified organism: Secondary | ICD-10-CM | POA: Diagnosis not present

## 2020-04-08 DIAGNOSIS — R531 Weakness: Secondary | ICD-10-CM | POA: Diagnosis not present

## 2020-04-08 DIAGNOSIS — M86172 Other acute osteomyelitis, left ankle and foot: Secondary | ICD-10-CM | POA: Diagnosis not present

## 2020-04-09 ENCOUNTER — Ambulatory Visit (INDEPENDENT_AMBULATORY_CARE_PROVIDER_SITE_OTHER): Payer: Medicare HMO | Admitting: Physician Assistant

## 2020-04-09 ENCOUNTER — Encounter: Payer: Self-pay | Admitting: Physician Assistant

## 2020-04-09 ENCOUNTER — Ambulatory Visit: Payer: Medicare HMO | Admitting: Cardiovascular Disease

## 2020-04-09 DIAGNOSIS — Z89512 Acquired absence of left leg below knee: Secondary | ICD-10-CM

## 2020-04-09 DIAGNOSIS — S88119A Complete traumatic amputation at level between knee and ankle, unspecified lower leg, initial encounter: Secondary | ICD-10-CM

## 2020-04-09 NOTE — Progress Notes (Signed)
Office Visit Note   Patient: Nicole [name]           Date of Birth: 1965/11/10           MRN: [id number] Visit Date: 04/09/2020              Requested by: [name], MD [address] [place],  [place] [address] PCP: [name], MD  No chief complaint on file.     HPI: Patient is status post below-knee amputation almost 2 months.  She is going to see [facility] today refit for prosthetic.  Her only concern was that she has developed a small blister at the end of her amputation stump.  She did start wearing her liner recently.  Assessment & Plan: Visit Diagnoses: No diagnosis found.  Plan: Asked that she use the shrinker beneath the liner.  We will put in an order for physical therapy follow-up in a month.  Sooner if any concerns  Follow-Up Instructions: No follow-ups on file.   Ortho Exam  Patient is alert, oriented, no adenopathy, well-dressed, normal affect, normal respiratory effort. Well-healed amputation stump.  No cellulitis mild to moderate soft tissue swelling good knee range of motion no concerns for infection.  She does have a small blister at the end of the stump with just some clear drainage.  No surrounding cellulitis  Imaging: No results found. No images are attached to the encounter.  Labs: Lab Results  Component Value Date   HGBA1C 9.6 (H) 12/25/2019   HGBA1C 6.9 (H) 04/10/2012   HGBA1C 8.5 (H) 02/19/2011   ESRSEDRATE 67 (H) 12/05/2019   CRP 8.7 (H) 12/05/2019   REPTSTATUS 01/27/2020 FINAL 01/22/2020   GRAMSTAIN  02/19/2011    ABUNDANT WBC PRESENT,BOTH PMN AND MONONUCLEAR RARE SQUAMOUS EPITHELIAL CELLS PRESENT FEW GRAM POSITIVE COCCI IN PAIRS RARE GRAM NEGATIVE RODS RARE GRAM POSITIVE RODS   CULT  01/22/2020    NO GROWTH 5 DAYS Performed at [facility], [address] [place], [place] [address]      Lab Results  Component Value Date   ALBUMIN 2.5 (L) 01/23/2020   ALBUMIN 2.5 (L) 01/22/2020   ALBUMIN 2.9 (L) 12/06/2019    PREALBUMIN 17.9 (L) 12/05/2019    Lab Results  Component Value Date   MG 1.6 (L) 02/04/2020   MG 1.6 (L) 02/01/2020   MG 1.6 (L) 01/23/2020   No results found for: VD25OH  Lab Results  Component Value Date   PREALBUMIN 17.9 (L) 12/05/2019   CBC EXTENDED Latest Ref Rng & Units 01/29/2020 01/27/2020 01/26/2020  WBC 4.0 - 10.5 K/uL 14.2(H) 15.2(H) 19.0(H)  RBC 3.87 - 5.11 MIL/uL 3.54(L) 3.45(L) 3.72(L)  HGB 12.0 - 15.0 g/dL 10.3(L) 10.4(L) 11.0(L)  HCT 36.0 - 46.0 % 34.0(L) 33.3(L) 36.5  PLT 150 - 400 K/uL 347 363 356  NEUTROABS 1.7 - 7.7 K/uL 9.7(H) 11.0(H) 14.2(H)  LYMPHSABS 0.7 - 4.0 K/uL 2.9 2.6 2.7     There is no height or weight on file to calculate BMI.  Orders:  No orders of the defined types were placed in this encounter.  No orders of the defined types were placed in this encounter.    Procedures: No procedures performed  Clinical Data: No additional findings.  ROS:  All other systems negative, except as noted in the HPI. Review of Systems  Objective: Vital Signs: There were no vitals taken for this visit.  Specialty Comments:  No specialty comments available.  PMFS History: Patient Active  Problem List   Diagnosis Date Noted  . Acute osteomyelitis of toe, left (HCC)   . Sepsis due to Lt foot osteo 01/23/2020  . Morbid obesity with body mass index (BMI) of 50.0 to 59.9 in adult [facility]) 01/23/2020  . Tobacco abuse--2 P/day Smoker 01/23/2020  . Cellulitis of left foot 01/22/2020  . Osteomyelitis of left foot (HCC) 01/22/2020  . Leukocytosis 01/22/2020  . Hypokalemia 01/22/2020  . Hypoalbuminemia 01/22/2020  . Hyperglycemia due to diabetes mellitus (HCC) 01/22/2020  . Elevated alpha fetoprotein 01/22/2020  . SIRS (systemic inflammatory response syndrome) (HCC) 01/22/2020  . Diabetic ulcer of left foot (HCC) 12/05/2019  . Cough 05/26/2012  . TIA (transient ischemic attack) 04/10/2012  . Acute diastolic congestive heart failure (HCC) 11/12/2011  .  COPD (chronic obstructive pulmonary disease) (HCC) 11/09/2011  . Dyspnea 06/10/2011  . CAP (community acquired pneumonia) 02/19/2011  . Coronary atherosclerosis of native coronary artery/Stent in 2006 02/19/2011  . Dyslipidemia   . Morbid obesity (HCC)   . Diabetes mellitus type 2 in obese (HCC)   . HTN (hypertension), benign    Past Medical History:  Diagnosis Date  . CAP (community acquired pneumonia)    Streptococcus 01/2011  . COPD (chronic obstructive pulmonary disease) (HCC)   . Coronary atherosclerosis of native coronary artery    Diagnosed [place] 2006 - DES RCA, reports followup cath 2009 at [facility]   . Dyslipidemia   . Essential hypertension, benign   . Morbid obesity (HCC)   . Pancreatitis   . Type 2 diabetes mellitus (HCC)     Family History  Problem Relation Age of Onset  . Diabetes Mother   . Heart failure Mother   . Hypertension Mother   . Kidney failure Mother   . Ovarian cancer Mother   . Asthma Son     Past Surgical History:  Procedure Laterality Date  . ABDOMINAL HERNIA REPAIR    . ABDOMINAL HYSTERECTOMY    . AMPUTATION Left 01/25/2020   Procedure: LEFT BELOW KNEE AMPUTATION;  Surgeon: [name], MD;  Location: [facility] OR;  Service: Orthopedics;  Laterality: Left;  . CORONARY ANGIOPLASTY WITH STENT PLACEMENT  2006  . KNEE SURGERY    . NOSE SURGERY    . Pilonidal cystectomy    . TONSILLECTOMY     Social History   Occupational History  . Occupation: Home health aide    Employer: [employer]  Tobacco Use  . Smoking status: Current Every Day Smoker    Packs/day: 0.50    Years: 20.00    Pack years: 10.00    Types: Cigarettes  . Smokeless tobacco: Never Used  Vaping Use  . Vaping Use: Never used  Substance and Sexual Activity  . Alcohol use: No  . Drug use: No  . Sexual activity: Yes    Birth control/protection: Surgical

## 2020-04-24 ENCOUNTER — Other Ambulatory Visit (INDEPENDENT_AMBULATORY_CARE_PROVIDER_SITE_OTHER): Payer: Self-pay | Admitting: Nurse Practitioner

## 2020-04-24 DIAGNOSIS — I2510 Atherosclerotic heart disease of native coronary artery without angina pectoris: Secondary | ICD-10-CM

## 2020-04-24 DIAGNOSIS — E1169 Type 2 diabetes mellitus with other specified complication: Secondary | ICD-10-CM

## 2020-04-24 DIAGNOSIS — E669 Obesity, unspecified: Secondary | ICD-10-CM

## 2020-04-24 DIAGNOSIS — J449 Chronic obstructive pulmonary disease, unspecified: Secondary | ICD-10-CM

## 2020-04-24 DIAGNOSIS — I10 Essential (primary) hypertension: Secondary | ICD-10-CM

## 2020-04-24 DIAGNOSIS — E785 Hyperlipidemia, unspecified: Secondary | ICD-10-CM

## 2020-04-24 DIAGNOSIS — I509 Heart failure, unspecified: Secondary | ICD-10-CM

## 2020-04-28 ENCOUNTER — Telehealth (INDEPENDENT_AMBULATORY_CARE_PROVIDER_SITE_OTHER): Payer: Self-pay

## 2020-04-28 ENCOUNTER — Other Ambulatory Visit (INDEPENDENT_AMBULATORY_CARE_PROVIDER_SITE_OTHER): Payer: Self-pay | Admitting: Internal Medicine

## 2020-04-28 ENCOUNTER — Other Ambulatory Visit (INDEPENDENT_AMBULATORY_CARE_PROVIDER_SITE_OTHER): Payer: Self-pay

## 2020-04-28 MED ORDER — APIXABAN 5 MG PO TABS: 5.0000 mg | ORAL_TABLET | Freq: Two times a day (BID) | ORAL | 0 refills | Status: DC

## 2020-04-28 NOTE — Telephone Encounter (Signed)
Received a fax from [facility] stating the patient requested a refill of the following medication:  apixaban (ELIQUIS) 5 MG TABS tablet   I also see patient is taking Plavix  Last OV 12/25/2019

## 2020-04-28 NOTE — Telephone Encounter (Signed)
Okay, I have sent that prescription but only for 1 month supply.  Please schedule her follow-up appointment as she canceled the last one.

## 2020-04-28 NOTE — Telephone Encounter (Signed)
Called patient and gave her the message. Patient has an appointment on 05/14/2020 to see Maralyn Sago. Patient verbalized an understanding.

## 2020-05-06 DIAGNOSIS — R531 Weakness: Secondary | ICD-10-CM | POA: Diagnosis not present

## 2020-05-06 DIAGNOSIS — M86172 Other acute osteomyelitis, left ankle and foot: Secondary | ICD-10-CM | POA: Diagnosis not present

## 2020-05-06 DIAGNOSIS — A419 Sepsis, unspecified organism: Secondary | ICD-10-CM | POA: Diagnosis not present

## 2020-05-07 ENCOUNTER — Other Ambulatory Visit: Payer: Self-pay

## 2020-05-07 ENCOUNTER — Encounter: Payer: Self-pay | Admitting: Physician Assistant

## 2020-05-07 ENCOUNTER — Ambulatory Visit (INDEPENDENT_AMBULATORY_CARE_PROVIDER_SITE_OTHER): Payer: Medicare HMO | Admitting: Physician Assistant

## 2020-05-07 DIAGNOSIS — S88119A Complete traumatic amputation at level between knee and ankle, unspecified lower leg, initial encounter: Secondary | ICD-10-CM

## 2020-05-07 DIAGNOSIS — Z89512 Acquired absence of left leg below knee: Secondary | ICD-10-CM

## 2020-05-07 NOTE — Progress Notes (Signed)
Office Visit Note   Patient: Nicole [name]           Date of Birth: March 30, 1965           MRN: [id number] Visit Date: 05/07/2020              Requested by: [name], MD [address] [place],  [place] [address] PCP: [name], MD  No chief complaint on file.     HPI: Patient is over 3 months status post left below-knee amputation.  Her prosthetic is in the process of being adjusted as it was slightly snug.  She has already been contacted by physical therapy  Assessment & Plan: Visit Diagnoses: No diagnosis found.  Plan: Patient will follow up with [place] in 3 months will obtain her prosthetic begin PT as discussed  Follow-Up Instructions: No follow-ups on file.   Ortho Exam  Patient is alert, oriented, no adenopathy, well-dressed, normal affect, normal respiratory effort. Examination well-healed amputation stump no erythema no ascending cellulitis no areas of concern.  Swelling is very well controlled  Imaging: No results found. No images are attached to the encounter.  Labs: Lab Results  Component Value Date   HGBA1C 9.6 (H) 12/25/2019   HGBA1C 6.9 (H) 04/10/2012   HGBA1C 8.5 (H) 02/19/2011   ESRSEDRATE 67 (H) 12/05/2019   CRP 8.7 (H) 12/05/2019   REPTSTATUS 01/27/2020 FINAL 01/22/2020   GRAMSTAIN  02/19/2011    ABUNDANT WBC PRESENT,BOTH PMN AND MONONUCLEAR RARE SQUAMOUS EPITHELIAL CELLS PRESENT FEW GRAM POSITIVE COCCI IN PAIRS RARE GRAM NEGATIVE RODS RARE GRAM POSITIVE RODS   CULT  01/22/2020    NO GROWTH 5 DAYS Performed at [facility], [address], [place], [place] [address]      Lab Results  Component Value Date   ALBUMIN 2.5 (L) 01/23/2020   ALBUMIN 2.5 (L) 01/22/2020   ALBUMIN 2.9 (L) 12/06/2019   PREALBUMIN 17.9 (L) 12/05/2019    Lab Results  Component Value Date   MG 1.6 (L) 02/04/2020   MG 1.6 (L) 02/01/2020   MG 1.6 (L) 01/23/2020   No results found for: VD25OH  Lab Results  Component Value Date   PREALBUMIN 17.9 (L)  12/05/2019   CBC EXTENDED Latest Ref Rng & Units 01/29/2020 01/27/2020 01/26/2020  WBC 4.0 - 10.5 K/uL 14.2(H) 15.2(H) 19.0(H)  RBC 3.87 - 5.11 MIL/uL 3.54(L) 3.45(L) 3.72(L)  HGB 12.0 - 15.0 g/dL 10.3(L) 10.4(L) 11.0(L)  HCT 36.0 - 46.0 % 34.0(L) 33.3(L) 36.5  PLT 150 - 400 K/uL 347 363 356  NEUTROABS 1.7 - 7.7 K/uL 9.7(H) 11.0(H) 14.2(H)  LYMPHSABS 0.7 - 4.0 K/uL 2.9 2.6 2.7     There is no height or weight on file to calculate BMI.  Orders:  No orders of the defined types were placed in this encounter.  No orders of the defined types were placed in this encounter.    Procedures: No procedures performed  Clinical Data: No additional findings.  ROS:  All other systems negative, except as noted in the HPI. Review of Systems  Objective: Vital Signs: There were no vitals taken for this visit.  Specialty Comments:  No specialty comments available.  PMFS History: Patient Active Problem List   Diagnosis Date Noted  . Acute osteomyelitis of toe, left (HCC)   . Sepsis due to Lt foot osteo 01/23/2020  . Morbid obesity with body mass index (BMI) of 50.0 to 59.9 in adult [facility]) 01/23/2020  . Tobacco abuse--2 P/day Smoker  01/23/2020  . Cellulitis of left foot 01/22/2020  . Osteomyelitis of left foot (HCC) 01/22/2020  . Leukocytosis 01/22/2020  . Hypokalemia 01/22/2020  . Hypoalbuminemia 01/22/2020  . Hyperglycemia due to diabetes mellitus (HCC) 01/22/2020  . Elevated alpha fetoprotein 01/22/2020  . SIRS (systemic inflammatory response syndrome) (HCC) 01/22/2020  . Diabetic ulcer of left foot (HCC) 12/05/2019  . Cough 05/26/2012  . TIA (transient ischemic attack) 04/10/2012  . Acute diastolic congestive heart failure (HCC) 11/12/2011  . COPD (chronic obstructive pulmonary disease) (HCC) 11/09/2011  . Dyspnea 06/10/2011  . CAP (community acquired pneumonia) 02/19/2011  . Coronary atherosclerosis of native coronary artery/Stent in 2006 02/19/2011  . Dyslipidemia   . Morbid  obesity (HCC)   . Diabetes mellitus type 2 in obese (HCC)   . HTN (hypertension), benign    Past Medical History:  Diagnosis Date  . CAP (community acquired pneumonia)    Streptococcus 01/2011  . COPD (chronic obstructive pulmonary disease) (HCC)   . Coronary atherosclerosis of native coronary artery    Diagnosed [place] 2006 - DES RCA, reports followup cath [facility] at [facility]   . Dyslipidemia   . Essential hypertension, benign   . Morbid obesity (HCC)   . Pancreatitis   . Type 2 diabetes mellitus (HCC)     Family History  Problem Relation Age of Onset  . Diabetes Mother   . Heart failure Mother   . Hypertension Mother   . Kidney failure Mother   . Ovarian cancer Mother   . Asthma Son     Past Surgical History:  Procedure Laterality Date  . ABDOMINAL HERNIA REPAIR    . ABDOMINAL HYSTERECTOMY    . AMPUTATION Left 01/25/2020   Procedure: LEFT BELOW KNEE AMPUTATION;  Surgeon: [name], MD;  Location: [facility] OR;  Service: Orthopedics;  Laterality: Left;  . CORONARY ANGIOPLASTY WITH STENT PLACEMENT  2006  . KNEE SURGERY    . NOSE SURGERY    . Pilonidal cystectomy    . TONSILLECTOMY     Social History   Occupational History  . Occupation: Home health aide    Employer: [employer]  Tobacco Use  . Smoking status: Current Every Day Smoker    Packs/day: 0.50    Years: 20.00    Pack years: 10.00    Types: Cigarettes  . Smokeless tobacco: Never Used  Vaping Use  . Vaping Use: Never used  Substance and Sexual Activity  . Alcohol use: No  . Drug use: No  . Sexual activity: Yes    Birth control/protection: Surgical

## 2020-05-12 ENCOUNTER — Ambulatory Visit (INDEPENDENT_AMBULATORY_CARE_PROVIDER_SITE_OTHER): Payer: Medicare HMO | Admitting: Internal Medicine

## 2020-05-14 ENCOUNTER — Ambulatory Visit (INDEPENDENT_AMBULATORY_CARE_PROVIDER_SITE_OTHER): Payer: Medicare HMO | Admitting: Nurse Practitioner

## 2020-05-15 ENCOUNTER — Telehealth (INDEPENDENT_AMBULATORY_CARE_PROVIDER_SITE_OTHER): Payer: Self-pay | Admitting: Nurse Practitioner

## 2020-05-15 ENCOUNTER — Ambulatory Visit (INDEPENDENT_AMBULATORY_CARE_PROVIDER_SITE_OTHER): Payer: Medicare HMO | Admitting: Nurse Practitioner

## 2020-05-15 ENCOUNTER — Other Ambulatory Visit: Payer: Self-pay

## 2020-05-15 ENCOUNTER — Encounter (INDEPENDENT_AMBULATORY_CARE_PROVIDER_SITE_OTHER): Payer: Self-pay | Admitting: Nurse Practitioner

## 2020-05-15 VITALS — BP 124/78 | HR 93 | Temp 97.3°F | Ht 63.0 in

## 2020-05-15 DIAGNOSIS — M25561 Pain in right knee: Secondary | ICD-10-CM

## 2020-05-15 DIAGNOSIS — E785 Hyperlipidemia, unspecified: Secondary | ICD-10-CM

## 2020-05-15 DIAGNOSIS — I2510 Atherosclerotic heart disease of native coronary artery without angina pectoris: Secondary | ICD-10-CM | POA: Diagnosis not present

## 2020-05-15 DIAGNOSIS — F32A Depression, unspecified: Secondary | ICD-10-CM | POA: Diagnosis not present

## 2020-05-15 DIAGNOSIS — I4891 Unspecified atrial fibrillation: Secondary | ICD-10-CM

## 2020-05-15 DIAGNOSIS — E669 Obesity, unspecified: Secondary | ICD-10-CM

## 2020-05-15 DIAGNOSIS — I10 Essential (primary) hypertension: Secondary | ICD-10-CM

## 2020-05-15 DIAGNOSIS — J449 Chronic obstructive pulmonary disease, unspecified: Secondary | ICD-10-CM | POA: Diagnosis not present

## 2020-05-15 DIAGNOSIS — Z91030 Bee allergy status: Secondary | ICD-10-CM

## 2020-05-15 DIAGNOSIS — E1169 Type 2 diabetes mellitus with other specified complication: Secondary | ICD-10-CM

## 2020-05-15 MED ORDER — INSULIN DETEMIR 100 UNIT/ML ~~LOC~~ SOLN: 40.0000 [IU] | Freq: Two times a day (BID) | SUBCUTANEOUS | 11 refills | Status: DC

## 2020-05-15 MED ORDER — CELECOXIB 200 MG PO CAPS: 200.0000 mg | ORAL_CAPSULE | Freq: Two times a day (BID) | ORAL | 0 refills | Status: DC

## 2020-05-15 MED ORDER — BUPROPION HCL ER (SR) 100 MG PO TB12: 100.0000 mg | ORAL_TABLET | Freq: Two times a day (BID) | ORAL | 1 refills | Status: DC

## 2020-05-15 MED ORDER — TRULICITY 1.5 MG/0.5ML ~~LOC~~ SOAJ: 1.5000 mg | SUBCUTANEOUS | 3 refills | Status: DC

## 2020-05-15 MED ORDER — CLOPIDOGREL BISULFATE 75 MG PO TABS: 75.0000 mg | ORAL_TABLET | Freq: Every day | ORAL | 1 refills | Status: DC

## 2020-05-15 MED ORDER — SIMVASTATIN 80 MG PO TABS: 80.0000 mg | ORAL_TABLET | Freq: Every day | ORAL | 1 refills | Status: DC

## 2020-05-15 MED ORDER — OXYCODONE HCL 5 MG PO TABS
5.0000 mg | ORAL_TABLET | Freq: Four times a day (QID) | ORAL | 0 refills | Status: DC | PRN
Start: 2020-05-15 — End: 2020-12-23

## 2020-05-15 MED ORDER — DULOXETINE HCL 30 MG PO CPEP
30.0000 mg | ORAL_CAPSULE | Freq: Two times a day (BID) | ORAL | 1 refills | Status: DC
Start: 2020-05-15 — End: 2020-07-30

## 2020-05-15 MED ORDER — GLIPIZIDE 10 MG PO TABS: 10.0000 mg | ORAL_TABLET | Freq: Two times a day (BID) | ORAL | 1 refills | Status: DC

## 2020-05-15 MED ORDER — APIXABAN 5 MG PO TABS: 5.0000 mg | ORAL_TABLET | Freq: Two times a day (BID) | ORAL | 1 refills | Status: DC

## 2020-05-15 MED ORDER — METOPROLOL TARTRATE 50 MG PO TABS: 50.0000 mg | ORAL_TABLET | Freq: Two times a day (BID) | ORAL | 0 refills | Status: DC

## 2020-05-15 MED ORDER — NOVOLOG FLEXPEN 100 UNIT/ML ~~LOC~~ SOPN: PEN_INJECTOR | SUBCUTANEOUS | 11 refills | Status: DC

## 2020-05-15 MED ORDER — FUROSEMIDE 80 MG PO TABS: 80.0000 mg | ORAL_TABLET | Freq: Every day | ORAL | 0 refills | Status: DC

## 2020-05-15 MED ORDER — EPINEPHRINE 0.3 MG/0.3ML IJ SOAJ
0.3000 mg | INTRAMUSCULAR | 2 refills | Status: AC | PRN
Start: 2020-05-15 — End: ?

## 2020-05-15 NOTE — Patient Instructions (Signed)
Call your Gastroenterologist (Dr. [name]) to schedule your appointment:  Address: [address] [place], [place] [address] Phone: [phone]  Call your Endocrinologist (Dr. Fransico Him) to schedule your appointment: Address: [address] [place], [place] [address] Phone: [phone]  Call your Cardiologist (Dr. Eden Emms) to schedule an appointment: You may ask them for the [place] phone number and address Address: [address], [place], [place] [address] Phone: [phone]

## 2020-05-15 NOTE — Telephone Encounter (Signed)
Okay, when you have a minute will you call the patient and see if she would be willing to be seen somewhere outside of [place]?

## 2020-05-15 NOTE — Telephone Encounter (Signed)
Will you look into the psychiatry referral?  She tells me she has not yet heard from them.  Thank you.

## 2020-05-15 NOTE — Progress Notes (Signed)
Subjective:  Patient ID: Nicole [name], female    DOB: December 24, 1965  Age: 55 y.o. MRN: [id number]  CC:  Chief Complaint  Patient presents with  . Follow-up    Needs medications refilled, Novolog flexpen and everything, needs something for pain in right knee because she twists it, has had a few hospital visits      HPI  This patient arrives today for the above.  Atrial fibrillation/hypertension/hyperlipidemia: She continues on Eliquis and Plavix.  She is tolerating this medication well.  She is also on lisinopril metoprolol and simvastatin.  Generally she tells me she is feeling well.  Last LDL was 165.  She was referred to cardiology but has not yet had an appointment made.  She tells me that she was recently hospitalized at which point she had left below the knee amputation and so she was told by the cardiologist office to call them when she is ready to go and see them.   COPD: She is requesting portable oxygen.  She does have oxygen at home.  She was ordered this at her initial visit with me in November, but for some reason this was never delivered.  Type 2 diabetes: I stated above she was recently hospitalized and had left BKA.  She tells me that she has been out of some of her medications since discharge and would like to go over which ones she needs refills.  She is on Levemir, Trulicity, glipizide, and sliding scale NovoLog.  She has been referred to endocrinology and tells me she is going to call them to get an appointment scheduled.  Depression: She continues on chronic medications for her depression and has been referred to psychiatry.  She has not yet heard from this referral.  Right knee pain: She is been experiencing right knee pain that started 2 to 3 weeks ago.  She tells me at the time of the pain she was standing up and pivoting to use her toilet and feels she twisted her knee wrong and since then she is having pain.  She tells me when she stands on her leg pain is 9/10  in intensity at rest at 6/10 intensity.  She tried ice, heat, icy hot, BenGay, Tylenol, and Advil without improvement.  She is taken oxycodone in the past for pain.  Per [facility], The controlled substance database I see she has not had any oxycodone prescribed in the last 3 months.    Past Medical History:  Diagnosis Date  . CAP (community acquired pneumonia)    Streptococcus 01/2011  . COPD (chronic obstructive pulmonary disease) ([place])   . Coronary atherosclerosis of native coronary artery    Diagnosed [place] 2006 - DES RCA, reports followup cath 2009 at [facility]   . Dyslipidemia   . Essential hypertension, benign   . Morbid obesity ([place])   . Pancreatitis   . Type 2 diabetes mellitus (HCC)       Family History  Problem Relation Age of Onset  . Diabetes Mother   . Heart failure Mother   . Hypertension Mother   . Kidney failure Mother   . Ovarian cancer Mother   . Asthma Son     Social History   Social History Narrative  . Not on file   Social History   Tobacco Use  . Smoking status: Former Smoker    Packs/day: 0.50    Years: 20.00    Pack years: 10.00    Types: Cigarettes  Quit date: 01/22/2020    Years since quitting: 0.3  . Smokeless tobacco: Never Used  Substance Use Topics  . Alcohol use: No     Current Meds  Medication Sig  . acetaminophen (TYLENOL) 500 MG tablet Take 1 tablet (500 mg total) by mouth every 6 (six) hours as needed for moderate pain.  Marland Kitchen albuterol (PROVENTIL) (2.5 MG/3ML) 0.083% nebulizer solution Take 3 mLs (2.5 mg total) by nebulization every 6 (six) hours as needed for wheezing.  Marland Kitchen albuterol (VENTOLIN HFA) 108 (90 Base) MCG/ACT inhaler Inhale 2 puffs into the lungs as needed for wheezing. Shortness of breath  . docusate sodium (COLACE) 100 MG capsule Take 1 capsule (100 mg total) by mouth 2 (two) times daily.  Marland Kitchen EASY TOUCH INSULIN SYRINGE 31G X 5/16" 1 ML MISC   . insulin aspart (NOVOLOG FLEXPEN) 100 UNIT/ML FlexPen Inject 0-15  Units into the skin every 4 (four) hours. For glucose 121 to 150 use 2 units, for 151 to 200 use 3 units, for 201 to 250 use 5 units, for 251 to 300 use 8 units, for 301 to 350 use 11 units for 351 or greater use 15 units  . lisinopril (ZESTRIL) 2.5 MG tablet Take 2.5 mg by mouth daily.  Marland Kitchen loperamide (IMODIUM) 2 MG capsule Take 1 capsule (2 mg total) by mouth every 6 (six) hours as needed for diarrhea or loose stools.  . Multiple Vitamins-Calcium (ONE-A-DAY WOMENS FORMULA) TABS Take 1 tablet by mouth daily.  . mupirocin ointment (BACTROBAN) 2 % Apply topically daily.  Marland Kitchen oxyCODONE (OXY IR/ROXICODONE) 5 MG immediate release tablet Take 1 tablet (5 mg total) by mouth every 6 (six) hours as needed for severe pain.  . pantoprazole (PROTONIX) 40 MG tablet TAKE 1 TABLET BY MOUTH ONCE DAILY 30-60  MINUTES  BEFORE  THE  FIRST  MEAL  OF  THE  DAY  . senna-docusate (SENOKOT-S) 8.6-50 MG tablet Take 1 tablet by mouth at bedtime as needed for mild constipation.  . [DISCONTINUED] apixaban (ELIQUIS) 5 MG TABS tablet Take 1 tablet (5 mg total) by mouth 2 (two) times daily.  . [DISCONTINUED] buPROPion (WELLBUTRIN SR) 100 MG 12 hr tablet Take 1 tablet by mouth twice daily  . [DISCONTINUED] celecoxib (CELEBREX) 200 MG capsule Take 1 capsule (200 mg total) by mouth 2 (two) times daily.  . [DISCONTINUED] citalopram (CELEXA) 10 MG tablet Take 10 mg by mouth daily.  . [DISCONTINUED] clopidogrel (PLAVIX) 75 MG tablet Take 1 tablet (75 mg total) by mouth daily.  . [DISCONTINUED] DULoxetine (CYMBALTA) 30 MG capsule Take 1 capsule (30 mg total) by mouth 2 (two) times daily.  . [DISCONTINUED] EPINEPHrine 0.3 mg/0.3 mL IJ SOAJ injection Inject 0.3 mg into the muscle as needed for anaphylaxis.  . [DISCONTINUED] furosemide (LASIX) 80 MG tablet Take 1 tablet (80 mg total) by mouth daily.  . [DISCONTINUED] glipiZIDE (GLUCOTROL) 10 MG tablet Take 1 tablet (10 mg total) by mouth in the morning and at bedtime.  . [DISCONTINUED]  insulin aspart (NOVOLOG) 100 UNIT/ML injection Inject 0-15 Units into the skin every 4 (four) hours. For glucose 121 to 150 use 2 units, for 151 to 200 use 3 units, for 201 to 250 use 5 units, for 251 to 300 use 8 units, for 301 to 350 use 11 units for 351 or greater use 15 units.  . [DISCONTINUED] insulin detemir (LEVEMIR) 100 UNIT/ML injection Inject 0.4 mLs (40 Units total) into the skin 2 (two) times daily.  . [DISCONTINUED] metoprolol  tartrate (LOPRESSOR) 50 MG tablet Take 1 tablet (50 mg total) by mouth 2 (two) times daily.  . [DISCONTINUED] simvastatin (ZOCOR) 80 MG tablet Take 80 mg by mouth daily.  . [DISCONTINUED] TRULICITY 1.5 JJ/9.4RD SOPN Inject 1.5 mg into the skin once a week.     ROS:  Review of Systems  Eyes: Negative for blurred vision.  Respiratory: Negative for shortness of breath.   Cardiovascular: Negative for chest pain.  Musculoskeletal: Positive for joint pain.     Objective:   Today's Vitals: BP 124/78   Pulse 93   Temp (!) 97.3 F (36.3 C) (Temporal)   Ht $R'5\' 3"'fc$  (1.6 m)   SpO2 95%   BMI 51.37 kg/m  Vitals with BMI 05/15/2020 02/20/2020 02/06/2020  Height $Remov'5\' 3"'pJANcn$  $Remove'5\' 3"'FYoNynd$  -  Weight (No Data) 290 lbs -  BMI - 40.81 -  Systolic 448 - 185  Diastolic 78 - 71  Pulse 93 - 86     Physical Exam Vitals reviewed.  Constitutional:      General: She is not in acute distress.    Appearance: Normal appearance. She is obese.  HENT:     Head: Normocephalic and atraumatic.  Neck:     Vascular: No carotid bruit.  Cardiovascular:     Rate and Rhythm: Normal rate. Rhythm irregularly irregular.     Pulses: Normal pulses.     Heart sounds: Normal heart sounds.  Pulmonary:     Effort: Pulmonary effort is normal.     Breath sounds: Normal breath sounds.  Musculoskeletal:     Right knee: Bony tenderness present. No swelling or effusion. Decreased range of motion.  Skin:    General: Skin is warm and dry.  Neurological:     General: No focal deficit present.      Mental Status: She is alert and oriented to person, place, and time.  Psychiatric:        Mood and Affect: Mood normal.        Behavior: Behavior normal.        Judgment: Judgment normal.          Assessment and Plan   1. Atrial fibrillation, unspecified type ([place])   2. Atherosclerosis of native coronary artery of native heart, unspecified whether angina present   3. HTN (hypertension), benign   4. Chronic obstructive pulmonary disease, unspecified COPD type ([place])   5. Dyslipidemia   6. Diabetes mellitus type 2 in obese ([place])   7. Depression, unspecified depression type   8. Bee sting allergy   9. Acute pain of right knee      Plan: 1.-3.,  5.  She will continue on her chronic medications and will follow up with her cardiologist. 4.  I will attempt to reorder portable oxygen tank. 6.  I have reordered her diabetic medications and have encouraged her to call her endocrinologist to schedule appointment.  She plans on doing so.  We will check A1c today. 7.  I have refilled her medications and will look into the psychiatry referral. 8.  I have refilled her EpiPen. 9.  We will send her to have x-ray of her right knee.  Will order short course of oxycodone that she can take as needed for pain.  I have also recommended she use Ace wrap, ice, Tylenol, and ibuprofen.  We did discuss the risk of having too much pain medicine, and I explained to the patient as well as the patient's family member (daughter) that is present in  the room that if the patient starts to experience changes in level of consciousness, alertness, respiratory drive the need to send the patient to the hospital.  They tell me understand.  Tests ordered Orders Placed This Encounter  Procedures  . For home use only DME oxygen  . DG Knee Complete 4 Views Right  . Hemoglobin A1c  . CMP with eGFR([facility])      Meds ordered this encounter  Medications  . apixaban (ELIQUIS) 5 MG TABS tablet    Sig: Take 1 tablet (5 mg  total) by mouth 2 (two) times daily.    Dispense:  180 tablet    Refill:  1    Order Specific Question:   Supervising Provider    Answer:   Hurshel Party C [[id number]]  . buPROPion (WELLBUTRIN SR) 100 MG 12 hr tablet    Sig: Take 1 tablet (100 mg total) by mouth 2 (two) times daily.    Dispense:  180 tablet    Refill:  1    Order Specific Question:   Supervising Provider    Answer:   Hurshel Party C [[id number]]  . celecoxib (CELEBREX) 200 MG capsule    Sig: Take 1 capsule (200 mg total) by mouth 2 (two) times daily.    Dispense:  180 capsule    Refill:  0    Order Specific Question:   Supervising Provider    Answer:   Hurshel Party C [[id number]]  . clopidogrel (PLAVIX) 75 MG tablet    Sig: Take 1 tablet (75 mg total) by mouth daily.    Dispense:  90 tablet    Refill:  1    Order Specific Question:   Supervising Provider    Answer:   Hurshel Party C [[id number]]  . DULoxetine (CYMBALTA) 30 MG capsule    Sig: Take 1 capsule (30 mg total) by mouth 2 (two) times daily.    Dispense:  180 capsule    Refill:  1    Order Specific Question:   Supervising Provider    Answer:   Hurshel Party C [[id number]]  . EPINEPHrine 0.3 mg/0.3 mL IJ SOAJ injection    Sig: Inject 0.3 mg into the muscle as needed for anaphylaxis.    Dispense:  1 each    Refill:  2    Order Specific Question:   Supervising Provider    Answer:   Hurshel Party C [[id number]]  . furosemide (LASIX) 80 MG tablet    Sig: Take 1 tablet (80 mg total) by mouth daily.    Dispense:  90 tablet    Refill:  0    Order Specific Question:   Supervising Provider    Answer:   Hurshel Party C [[id number]]  . glipiZIDE (GLUCOTROL) 10 MG tablet    Sig: Take 1 tablet (10 mg total) by mouth in the morning and at bedtime.    Dispense:  180 tablet    Refill:  1    Order Specific Question:   Supervising Provider    Answer:   Hurshel Party C [[id number]]  . insulin detemir (LEVEMIR) 100 UNIT/ML injection    Sig: Inject 0.4 mLs (40 Units total) into the skin 2 (two)  times daily.    Dispense:  10 mL    Refill:  11    Order Specific Question:   Supervising Provider    Answer:   Hurshel Party C [[id number]]  . insulin aspart (NOVOLOG FLEXPEN) 100 UNIT/ML FlexPen    Sig: Inject 0-15 Units  into the skin every 4 (four) hours. For glucose 121 to 150 use 2 units, for 151 to 200 use 3 units, for 201 to 250 use 5 units, for 251 to 300 use 8 units, for 301 to 350 use 11 units for 351 or greater use 15 units    Dispense:  15 mL    Refill:  11    Order Specific Question:   Supervising Provider    Answer:   Anastasio Champion, [place]  . metoprolol tartrate (LOPRESSOR) 50 MG tablet    Sig: Take 1 tablet (50 mg total) by mouth 2 (two) times daily.    Dispense:  180 tablet    Refill:  0    Order Specific Question:   Supervising Provider    Answer:   Hurshel Party C [[id number]]  . simvastatin (ZOCOR) 80 MG tablet    Sig: Take 1 tablet (80 mg total) by mouth daily.    Dispense:  90 tablet    Refill:  1    Order Specific Question:   Supervising Provider    Answer:   Hurshel Party C [[id number]]  . TRULICITY 1.5 GQ/6.7YP SOPN    Sig: Inject 1.5 mg into the skin once a week.    Dispense:  2 mL    Refill:  3    Order Specific Question:   Supervising Provider    Answer:   Anastasio Champion, NIM[place] C [[id number]]  . oxyCODONE (OXY IR/ROXICODONE) 5 MG immediate release tablet    Sig: Take 1 tablet (5 mg total) by mouth every 6 (six) hours as needed for severe pain.    Dispense:  12 tablet    Refill:  0    Order Specific Question:   Supervising Provider    Answer:   Doree Albee [[id number]]    Patient to follow-up in 3 months or sooner as needed.  Ailene Ards, NP

## 2020-05-15 NOTE — Telephone Encounter (Signed)
Pt last referral was sent on 12/25/19. It was Denied; no provider in area of [facility] office. So it went in a denied basket.

## 2020-05-16 LAB — COMPLETE METABOLIC PANEL WITH GFR
AG Ratio: 1.1 (calc) (ref 1.0–2.5)
ALT: 13 U/L (ref 6–29)
AST: 26 U/L (ref 10–35)
Albumin: 3.5 g/dL — ABNORMAL LOW (ref 3.6–5.1)
Alkaline phosphatase (APISO): 81 U/L (ref 37–153)
BUN: 16 mg/dL (ref 7–25)
CO2: 33 mmol/L — ABNORMAL HIGH (ref 20–32)
Calcium: 9.3 mg/dL (ref 8.6–10.4)
Chloride: 99 mmol/L (ref 98–110)
Creat: 0.83 mg/dL (ref 0.50–1.05)
GFR, Est African American: 91 mL/min/{1.73_m2} (ref >=60)
GFR, Est Non African American: 79 mL/min/{1.73_m2} (ref >=60)
Globulin: 3.3 g/dL (calc) (ref 1.9–3.7)
Glucose, Bld: 106 mg/dL — ABNORMAL HIGH (ref 65–99)
Potassium: 4.6 mmol/L (ref 3.5–5.3)
Sodium: 140 mmol/L (ref 135–146)
Total Bilirubin: 0.4 mg/dL (ref 0.2–1.2)
Total Protein: 6.8 g/dL (ref 6.1–8.1)

## 2020-05-16 LAB — HEMOGLOBIN A1C
Hgb A1c MFr Bld: 9.1 % of total Hgb — ABNORMAL HIGH (ref <5.7)
Mean Plasma Glucose: 214 mg/dL
eAG (mmol/L): 11.9 mmol/L

## 2020-05-19 ENCOUNTER — Telehealth (INDEPENDENT_AMBULATORY_CARE_PROVIDER_SITE_OTHER): Payer: Self-pay

## 2020-05-19 NOTE — Telephone Encounter (Signed)
Nicole [name] from [facility] called and left a voice message stating that the prescription request for Celebrex on 05/15/2020 flagged a drug interaction with Eliquis.  OK to continue to fill?   I will call her back at [phone].

## 2020-05-19 NOTE — Telephone Encounter (Signed)
Called JoNell at the pharmacy and gave her the message. JoNell verbalized an understanding and will fill the medication for patient.

## 2020-05-19 NOTE — Telephone Encounter (Signed)
Yes, okay to refill it.

## 2020-05-20 ENCOUNTER — Telehealth (INDEPENDENT_AMBULATORY_CARE_PROVIDER_SITE_OTHER): Payer: Self-pay

## 2020-05-21 NOTE — Telephone Encounter (Signed)
I hope so. Because , when I refer people to [place] , they don't want to drive in .

## 2020-05-21 NOTE — Telephone Encounter (Signed)
Okay, hopefully she will be able to get an appointment with them. Thank you.

## 2020-05-22 NOTE — Telephone Encounter (Signed)
Called lvm of referral appts . Explained that [facility] Is not Accepting any pt at this time. Offer to go to [place] in the area. Per Leilani Able, there working on the this problem currently. But she will have to use the resources in the area.

## 2020-05-22 NOTE — Telephone Encounter (Signed)
This very well may be the case for her.  Just make sure you explained to her that there was not a provider in the area that could accept her at this time.  You may also encouraged her to call her insurance company to see if there are any providers in the area that she would like to try to set up an appointment with, otherwise recommendation is for her to see psychiatry wherever we can get her an appointment.

## 2020-05-26 ENCOUNTER — Encounter: Payer: Medicare HMO | Admitting: Physical Therapy

## 2020-05-28 ENCOUNTER — Ambulatory Visit (HOSPITAL_COMMUNITY)
Admission: RE | Admit: 2020-05-28 | Discharge: 2020-05-28 | Disposition: A | Discharge status: Home / Self Care | Payer: Medicare HMO | Source: Ambulatory Visit | Attending: Nurse Practitioner | Admitting: Nurse Practitioner

## 2020-05-28 ENCOUNTER — Other Ambulatory Visit: Payer: Self-pay

## 2020-05-28 DIAGNOSIS — M25561 Pain in right knee: Secondary | ICD-10-CM | POA: Diagnosis not present

## 2020-05-31 ENCOUNTER — Encounter (INDEPENDENT_AMBULATORY_CARE_PROVIDER_SITE_OTHER): Payer: Self-pay | Admitting: Nurse Practitioner

## 2020-06-06 DIAGNOSIS — M86172 Other acute osteomyelitis, left ankle and foot: Secondary | ICD-10-CM | POA: Diagnosis not present

## 2020-06-06 DIAGNOSIS — A419 Sepsis, unspecified organism: Secondary | ICD-10-CM | POA: Diagnosis not present

## 2020-06-06 DIAGNOSIS — R531 Weakness: Secondary | ICD-10-CM | POA: Diagnosis not present

## 2020-06-19 ENCOUNTER — Telehealth (INDEPENDENT_AMBULATORY_CARE_PROVIDER_SITE_OTHER): Payer: Self-pay | Admitting: Internal Medicine

## 2020-06-19 NOTE — Telephone Encounter (Signed)
Left message for patient to call back and schedule Medicare Annual Wellness Visit (AWV) either virtually or in office.   awvi 03/25/16 per [facility]   please schedule at anytime with [facility]  health coach  This should be a 40 minute visit.

## 2020-06-26 ENCOUNTER — Telehealth (INDEPENDENT_AMBULATORY_CARE_PROVIDER_SITE_OTHER): Payer: Self-pay

## 2020-06-26 ENCOUNTER — Other Ambulatory Visit (INDEPENDENT_AMBULATORY_CARE_PROVIDER_SITE_OTHER): Payer: Self-pay | Admitting: Nurse Practitioner

## 2020-06-26 DIAGNOSIS — G4733 Obstructive sleep apnea (adult) (pediatric): Secondary | ICD-10-CM | POA: Diagnosis not present

## 2020-06-26 DIAGNOSIS — J449 Chronic obstructive pulmonary disease, unspecified: Secondary | ICD-10-CM

## 2020-06-26 NOTE — Progress Notes (Signed)
Just faxed order over to [facility] today.

## 2020-06-26 NOTE — Progress Notes (Signed)
Thank you :)

## 2020-06-26 NOTE — Telephone Encounter (Signed)
Patient called and stated that she needs a new nose piece and a hose for her oxygen concentrator that she brought with her from [place]. She has not had anything done for her oxygen since she has been a patient with [place] and stated that she is noticing chest tightness and thinks she needs new oxygen supplies. Patient stated that she has spoken to Maralyn Sago about this in the past and wanted me to send to Maralyn Sago. Can you place an order for [facility]?

## 2020-06-26 NOTE — Progress Notes (Signed)
Nicole [name], I will place the printed prescription for the nasal cannula for this patient up front. The patient is aware that someone will need to come pick it up.   [name], will you make sure the referral for home health for oxygen is sent to/received by [facility]? She would prefer to use this company if possible. Let me know if you need anything else. Thank you.

## 2020-06-26 NOTE — Telephone Encounter (Signed)
I called this patient today to verify exactly what medical equipment she needs.  She tells me her nasal cannula has a hole in one of the prongs, and needs a replacement.  She tells me she uses a long 50 foot nasal cannula and would like the size ordered again.  She also tells me that she still has not heard from getting portable oxygen tank.  Thus I will order [facility] per patient's choice to assist her with making sure that she has her oxygen equipment and possibly getting her portable oxygen tank.

## 2020-06-26 NOTE — Telephone Encounter (Signed)
Does she mean a nasal cannula? Also, if she is having chest tightness she should probably be seen in the office asap, see if she would like to come in for an acute visit sooner than what is currently scheduled.

## 2020-06-26 NOTE — Telephone Encounter (Signed)
She stated that she has a hole in her face mask and she is not getting all of the oxygen when she uses her equipment. Patient stated that she really needs all new equipment because she has not had anything updated since she has been a patient with [place].

## 2020-06-30 NOTE — Progress Notes (Signed)
So they faxed some forms with the oxygen test on it . In your folder.

## 2020-07-01 ENCOUNTER — Telehealth (INDEPENDENT_AMBULATORY_CARE_PROVIDER_SITE_OTHER): Payer: Self-pay

## 2020-07-02 ENCOUNTER — Telehealth (INDEPENDENT_AMBULATORY_CARE_PROVIDER_SITE_OTHER): Payer: Self-pay | Admitting: Nurse Practitioner

## 2020-07-02 ENCOUNTER — Encounter (INDEPENDENT_AMBULATORY_CARE_PROVIDER_SITE_OTHER): Payer: Self-pay

## 2020-07-02 NOTE — Telephone Encounter (Signed)
Please call this patient and set her up for an appointment with me for oxygen saturation testing.  Even though she has been on oxygen chronically, [facility] is requiring that we test her oxygen saturations on room air to make sure she qualifies for her oxygen.  Please let me know when this is been scheduled so I can make myself a note regarding this visit.  Thank you.

## 2020-07-02 NOTE — Telephone Encounter (Signed)
Provider is aware of need today. Will have pt to come into do the O2 testing to complete the forms.

## 2020-07-02 NOTE — Progress Notes (Signed)
I see them, will have to have her come in for oxygent saturation testing. I have asked Clydie Braun to call her and schedule this.

## 2020-07-02 NOTE — Telephone Encounter (Signed)
Just spoke with daughter and I scheduled her appointment for 07/10/20 @ 10:15

## 2020-07-02 NOTE — Progress Notes (Signed)
Ok, thank you

## 2020-07-06 DIAGNOSIS — A419 Sepsis, unspecified organism: Secondary | ICD-10-CM | POA: Diagnosis not present

## 2020-07-06 DIAGNOSIS — M86172 Other acute osteomyelitis, left ankle and foot: Secondary | ICD-10-CM | POA: Diagnosis not present

## 2020-07-06 DIAGNOSIS — R531 Weakness: Secondary | ICD-10-CM | POA: Diagnosis not present

## 2020-07-07 DIAGNOSIS — Z89512 Acquired absence of left leg below knee: Secondary | ICD-10-CM | POA: Diagnosis not present

## 2020-07-10 ENCOUNTER — Ambulatory Visit (INDEPENDENT_AMBULATORY_CARE_PROVIDER_SITE_OTHER): Payer: Medicare HMO | Admitting: Nurse Practitioner

## 2020-07-28 ENCOUNTER — Ambulatory Visit (INDEPENDENT_AMBULATORY_CARE_PROVIDER_SITE_OTHER): Payer: Medicare HMO | Admitting: Internal Medicine

## 2020-07-29 ENCOUNTER — Other Ambulatory Visit (INDEPENDENT_AMBULATORY_CARE_PROVIDER_SITE_OTHER): Payer: Self-pay | Admitting: Nurse Practitioner

## 2020-07-29 DIAGNOSIS — E785 Hyperlipidemia, unspecified: Secondary | ICD-10-CM

## 2020-07-29 DIAGNOSIS — I2510 Atherosclerotic heart disease of native coronary artery without angina pectoris: Secondary | ICD-10-CM

## 2020-07-29 DIAGNOSIS — J449 Chronic obstructive pulmonary disease, unspecified: Secondary | ICD-10-CM

## 2020-07-29 DIAGNOSIS — E669 Obesity, unspecified: Secondary | ICD-10-CM

## 2020-07-29 DIAGNOSIS — I10 Essential (primary) hypertension: Secondary | ICD-10-CM

## 2020-07-29 DIAGNOSIS — E1169 Type 2 diabetes mellitus with other specified complication: Secondary | ICD-10-CM

## 2020-07-29 DIAGNOSIS — I509 Heart failure, unspecified: Secondary | ICD-10-CM

## 2020-07-30 ENCOUNTER — Ambulatory Visit (INDEPENDENT_AMBULATORY_CARE_PROVIDER_SITE_OTHER): Payer: Medicare HMO | Admitting: Nurse Practitioner

## 2020-07-30 ENCOUNTER — Encounter (INDEPENDENT_AMBULATORY_CARE_PROVIDER_SITE_OTHER): Payer: Self-pay | Admitting: Nurse Practitioner

## 2020-07-30 ENCOUNTER — Other Ambulatory Visit: Payer: Self-pay

## 2020-07-30 VITALS — BP 148/76 | HR 89 | Temp 96.9°F | Ht 64.0 in | Wt 309.0 lb

## 2020-07-30 DIAGNOSIS — K5900 Constipation, unspecified: Secondary | ICD-10-CM

## 2020-07-30 DIAGNOSIS — I2510 Atherosclerotic heart disease of native coronary artery without angina pectoris: Secondary | ICD-10-CM | POA: Diagnosis not present

## 2020-07-30 DIAGNOSIS — E559 Vitamin D deficiency, unspecified: Secondary | ICD-10-CM

## 2020-07-30 DIAGNOSIS — I10 Essential (primary) hypertension: Secondary | ICD-10-CM | POA: Diagnosis not present

## 2020-07-30 DIAGNOSIS — R079 Chest pain, unspecified: Secondary | ICD-10-CM

## 2020-07-30 DIAGNOSIS — Z89512 Acquired absence of left leg below knee: Secondary | ICD-10-CM

## 2020-07-30 DIAGNOSIS — E669 Obesity, unspecified: Secondary | ICD-10-CM | POA: Diagnosis not present

## 2020-07-30 DIAGNOSIS — I4891 Unspecified atrial fibrillation: Secondary | ICD-10-CM | POA: Diagnosis not present

## 2020-07-30 DIAGNOSIS — E1169 Type 2 diabetes mellitus with other specified complication: Secondary | ICD-10-CM

## 2020-07-30 DIAGNOSIS — J9611 Chronic respiratory failure with hypoxia: Secondary | ICD-10-CM

## 2020-07-30 DIAGNOSIS — J449 Chronic obstructive pulmonary disease, unspecified: Secondary | ICD-10-CM

## 2020-07-30 DIAGNOSIS — E785 Hyperlipidemia, unspecified: Secondary | ICD-10-CM

## 2020-07-30 DIAGNOSIS — I509 Heart failure, unspecified: Secondary | ICD-10-CM

## 2020-07-30 DIAGNOSIS — F32A Depression, unspecified: Secondary | ICD-10-CM | POA: Insufficient documentation

## 2020-07-30 DIAGNOSIS — F419 Anxiety disorder, unspecified: Secondary | ICD-10-CM | POA: Insufficient documentation

## 2020-07-30 MED ORDER — METOPROLOL TARTRATE 50 MG PO TABS
50.0000 mg | ORAL_TABLET | Freq: Two times a day (BID) | ORAL | 1 refills | Status: DC
Start: 2020-07-30 — End: 2021-12-17

## 2020-07-30 MED ORDER — CELECOXIB 200 MG PO CAPS: 200.0000 mg | ORAL_CAPSULE | Freq: Two times a day (BID) | ORAL | 1 refills | Status: DC

## 2020-07-30 MED ORDER — BUPROPION HCL ER (SR) 100 MG PO TB12
100.0000 mg | ORAL_TABLET | Freq: Two times a day (BID) | ORAL | 1 refills | Status: DC
Start: 2020-07-30 — End: 2020-12-23

## 2020-07-30 MED ORDER — NOVOLOG FLEXPEN 100 UNIT/ML ~~LOC~~ SOPN: PEN_INJECTOR | SUBCUTANEOUS | 11 refills | Status: DC

## 2020-07-30 MED ORDER — DOCUSATE SODIUM 100 MG PO CAPS: 100.0000 mg | ORAL_CAPSULE | Freq: Two times a day (BID) | ORAL | 2 refills | Status: DC | PRN

## 2020-07-30 MED ORDER — LISINOPRIL 2.5 MG PO TABS: 2.5000 mg | ORAL_TABLET | Freq: Every day | ORAL | 2 refills | Status: DC

## 2020-07-30 MED ORDER — CLOPIDOGREL BISULFATE 75 MG PO TABS: 75.0000 mg | ORAL_TABLET | Freq: Every day | ORAL | 1 refills | Status: DC

## 2020-07-30 MED ORDER — FUROSEMIDE 80 MG PO TABS: 80.0000 mg | ORAL_TABLET | Freq: Every day | ORAL | 1 refills | Status: DC

## 2020-07-30 MED ORDER — DULOXETINE HCL 60 MG PO CPEP: 60.0000 mg | ORAL_CAPSULE | Freq: Every day | ORAL | 1 refills | Status: DC

## 2020-07-30 MED ORDER — SIMVASTATIN 80 MG PO TABS: 80.0000 mg | ORAL_TABLET | Freq: Every day | ORAL | 1 refills | Status: DC

## 2020-07-30 MED ORDER — INSULIN DETEMIR 100 UNIT/ML ~~LOC~~ SOLN: 80.0000 [IU] | Freq: Two times a day (BID) | SUBCUTANEOUS | 11 refills | Status: DC

## 2020-07-30 MED ORDER — PANTOPRAZOLE SODIUM 40 MG PO TBEC: 40.0000 mg | DELAYED_RELEASE_TABLET | Freq: Every day | ORAL | 2 refills | Status: AC

## 2020-07-30 MED ORDER — TRULICITY 1.5 MG/0.5ML ~~LOC~~ SOAJ: 1.5000 mg | SUBCUTANEOUS | 6 refills | Status: DC

## 2020-07-30 MED ORDER — GLIPIZIDE 10 MG PO TABS: 10.0000 mg | ORAL_TABLET | Freq: Two times a day (BID) | ORAL | 1 refills | Status: DC

## 2020-07-30 MED ORDER — ALBUTEROL SULFATE HFA 108 (90 BASE) MCG/ACT IN AERS: 2.0000 | INHALATION_SPRAY | Freq: Four times a day (QID) | RESPIRATORY_TRACT | 3 refills | Status: AC | PRN

## 2020-07-30 MED ORDER — ALBUTEROL SULFATE (2.5 MG/3ML) 0.083% IN NEBU: 2.5000 mg | INHALATION_SOLUTION | Freq: Four times a day (QID) | RESPIRATORY_TRACT | 12 refills | Status: DC | PRN

## 2020-07-30 NOTE — Progress Notes (Signed)
Subjective:  Patient ID: Nicole [name], female    DOB: Jan 14, 1966  Age: 55 y.o. MRN: [id number]  CC:  Chief Complaint  Patient presents with  . Follow-up    Checking oxygen saturation numbers today      HPI  This patient arrives today to check oxygen saturation.  She has been on continuous oxygen at 3 L/min for the last 4 to 5 years for treatment of her COPD.  She has COPD with chronic respiratory failure with hypoxia.  Symptoms that she experiences include shortness of breath, wheezing, orthopnea, and intermittent coughing.  She has tried albuterol inhalers as needed as well as Symbicort and Spiriva.  She tells me Symbicort and Spiriva in the past have not improved her symptoms so she is no longer taking this.  However she still does use albuterol as needed.  She is also need of refills for most of her medications.  She is a recent amputee of her left lower extremity, below the knee.  This was secondary to osteoarthritis which she was treated for back in November 2021.  She does have a prosthetic, but tells me she will get short of breath with activity and she does use a walker but is wondering if she could benefit from a walker with a seat so that she would be able to sit as needed.  In addition to the above she also has a past medical history of hypertension, hyperlipidemia, type 2 diabetes, obesity, vitamin D deficiency, atherosclerosis, atrial fibrillation, congestive heart failure, and depression.  Past Medical History:  Diagnosis Date  . CAP (community acquired pneumonia)    Streptococcus 01/2011  . COPD (chronic obstructive pulmonary disease) ([place])   . Coronary atherosclerosis of native coronary artery    Diagnosed [place] 2006 - DES RCA, reports followup cath 2009 at [facility]   . Dyslipidemia   . Essential hypertension, benign   . Morbid obesity ([place])   . Pancreatitis   . Type 2 diabetes mellitus (HCC)       Family History  Problem Relation Age of Onset  .  Diabetes Mother   . Heart failure Mother   . Hypertension Mother   . Kidney failure Mother   . Ovarian cancer Mother   . Asthma Son     Social History   Social History Narrative  . Not on file   Social History   Tobacco Use  . Smoking status: Former Smoker    Packs/day: 0.50    Years: 20.00    Pack years: 10.00    Types: Cigarettes    Quit date: 01/22/2020    Years since quitting: 0.5  . Smokeless tobacco: Never Used  Substance Use Topics  . Alcohol use: No     Current Meds  Medication Sig  . acetaminophen (TYLENOL) 500 MG tablet Take 1 tablet (500 mg total) by mouth every 6 (six) hours as needed for moderate pain.  Marland Kitchen apixaban (ELIQUIS) 5 MG TABS tablet Take 1 tablet (5 mg total) by mouth 2 (two) times daily.  Marland Kitchen EASY TOUCH INSULIN SYRINGE 31G X 5/16" 1 ML MISC   . EPINEPHrine 0.3 mg/0.3 mL IJ SOAJ injection Inject 0.3 mg into the muscle as needed for anaphylaxis.  Marland Kitchen loperamide (IMODIUM) 2 MG capsule Take 1 capsule (2 mg total) by mouth every 6 (six) hours as needed for diarrhea or loose stools.  . Multiple Vitamins-Calcium (ONE-A-DAY WOMENS FORMULA) TABS Take 1 tablet by mouth daily.  . mupirocin ointment (BACTROBAN)  2 % Apply topically daily.  Marland Kitchen oxyCODONE (OXY IR/ROXICODONE) 5 MG immediate release tablet Take 1 tablet (5 mg total) by mouth every 6 (six) hours as needed for severe pain.  Marland Kitchen senna-docusate (SENOKOT-S) 8.6-50 MG tablet Take 1 tablet by mouth at bedtime as needed for mild constipation.  . [DISCONTINUED] albuterol (PROVENTIL) (2.5 MG/3ML) 0.083% nebulizer solution Take 3 mLs (2.5 mg total) by nebulization every 6 (six) hours as needed for wheezing.  . [DISCONTINUED] albuterol (VENTOLIN HFA) 108 (90 Base) MCG/ACT inhaler Inhale 2 puffs into the lungs as needed for wheezing. Shortness of breath  . [DISCONTINUED] buPROPion (WELLBUTRIN SR) 100 MG 12 hr tablet Take 1 tablet (100 mg total) by mouth 2 (two) times daily.  . [DISCONTINUED] celecoxib (CELEBREX) 200 MG  capsule Take 1 capsule (200 mg total) by mouth 2 (two) times daily.  . [DISCONTINUED] clopidogrel (PLAVIX) 75 MG tablet Take 1 tablet (75 mg total) by mouth daily.  . [DISCONTINUED] docusate sodium (COLACE) 100 MG capsule Take 1 capsule (100 mg total) by mouth 2 (two) times daily.  . [DISCONTINUED] DULoxetine (CYMBALTA) 30 MG capsule Take 1 capsule (30 mg total) by mouth 2 (two) times daily.  . [DISCONTINUED] DULoxetine (CYMBALTA) 60 MG capsule   . [DISCONTINUED] furosemide (LASIX) 80 MG tablet Take 1 tablet (80 mg total) by mouth daily.  . [DISCONTINUED] glipiZIDE (GLUCOTROL) 10 MG tablet Take 1 tablet (10 mg total) by mouth in the morning and at bedtime.  . [DISCONTINUED] insulin aspart (NOVOLOG FLEXPEN) 100 UNIT/ML FlexPen Inject 0-15 Units into the skin every 4 (four) hours. For glucose 121 to 150 use 2 units, for 151 to 200 use 3 units, for 201 to 250 use 5 units, for 251 to 300 use 8 units, for 301 to 350 use 11 units for 351 or greater use 15 units  . [DISCONTINUED] insulin detemir (LEVEMIR) 100 UNIT/ML injection Inject 0.4 mLs (40 Units total) into the skin 2 (two) times daily.  . [DISCONTINUED] lisinopril (ZESTRIL) 2.5 MG tablet Take 2.5 mg by mouth daily.  . [DISCONTINUED] metoprolol tartrate (LOPRESSOR) 50 MG tablet Take 1 tablet (50 mg total) by mouth 2 (two) times daily.  . [DISCONTINUED] pantoprazole (PROTONIX) 40 MG tablet Take 1 tablet (40 mg total) by mouth daily.  . [DISCONTINUED] simvastatin (ZOCOR) 80 MG tablet Take 1 tablet (80 mg total) by mouth daily.  . [DISCONTINUED] TRULICITY 1.5 MO/7.0BE SOPN Inject 1.5 mg into the skin once a week.    ROS:  Review of Systems  Constitutional: Negative for malaise/fatigue.  Respiratory: Positive for cough, shortness of breath and wheezing.   Cardiovascular: Positive for chest pain and orthopnea.     Objective:   Today's Vitals: BP (!) 148/76   Pulse 89   Temp (!) 96.9 F (36.1 C) (Temporal)   Ht 5' 4"  (1.626 m)   Wt (!) 309 lb  (140.2 kg)   SpO2 96%   BMI 53.04 kg/m  Vitals with BMI 07/30/2020 05/15/2020 02/20/2020  Height 5' 4"  5' 3"  5' 3"   Weight 309 lbs (No Data) 290 lbs  BMI 67.54 - 49.20  Systolic 100 712 -  Diastolic 76 78 -  Pulse 89 93 -     Physical Exam Vitals reviewed.  Constitutional:      General: She is not in acute distress.    Appearance: Normal appearance.  HENT:     Head: Normocephalic and atraumatic.  Neck:     Vascular: No carotid bruit.  Cardiovascular:  Rate and Rhythm: Normal rate and regular rhythm.     Pulses: Normal pulses.     Heart sounds: Normal heart sounds.  Pulmonary:     Effort: Pulmonary effort is normal.     Breath sounds: Decreased breath sounds present.  Skin:    General: Skin is warm and dry.  Neurological:     General: No focal deficit present.     Mental Status: She is alert and oriented to person, place, and time.  Psychiatric:        Mood and Affect: Mood normal.        Behavior: Behavior normal.        Judgment: Judgment normal.     Oxygen saturation testing: At rest on room air oxygen saturation was 91% Oxygen saturation while ambulating on room air dropped to 89% Oxygen saturation while ambulating on 2 L nasal cannula improved to 95%     Assessment and Plan   1. Congestive heart failure, unspecified HF chronicity, unspecified heart failure type ([place])   2. History of left below knee amputation ([place])   3. Chronic obstructive pulmonary disease, unspecified COPD type ([place])   4. Atrial fibrillation, unspecified type ([place])   5. Atherosclerosis of native coronary artery of native heart, unspecified whether angina present   6. HTN (hypertension), benign   7. Dyslipidemia   8. Diabetes mellitus type 2 in obese ([place])   9. Morbid obesity ([place])   10. Constipation, unspecified constipation type   11. Depression, unspecified depression type   12. Vitamin D deficiency   13. Chest pain, unspecified type   14. Chronic respiratory failure with  hypoxia (HCC)      Plan: 1.,  4.,  13.  She is planning on getting established with cardiologist in this area.  She tells me she will plan on calling the cardiologist office within the next few days.  Chest pain appears to be chronic in nature and stable.  She will continue on her antihypertensives, statin therapy, Plavix, and Eliquis. 2.  She seems to be tolerating her prosthetic device well but I will prescribe rolling walker with seat that may be helpful for her for when she is ambulating in the community. 3.,  14.  I believe she does qualify for continuous oxygen.  We will reorder this through [facility] per her request. 5.,  6.,  7.  She will continue taking her chronic medications as prescribed. 8.  Last A1c was above goal but is slightly too early to recheck today.  For now she will continue on her medications as prescribed. 9.  We will work on helping her with weight loss at a subsequent visit. 10.  We will refill her stool softener that she can take as needed. 11.  She will continue on her antidepressants as currently prescribed. 12.  She did mention she is known to have low vitamin D in the past so I will collect serum vitamin D level for further evaluation today.   Tests ordered Orders Placed This Encounter  Procedures  . For home use only DME 4 wheeled rolling walker with seat ([id number])  . CBC  . CMP with eGFR([facility])  . Vitamin D, 25-hydroxy      Meds ordered this encounter  Medications  . albuterol (PROVENTIL) (2.5 MG/3ML) 0.083% nebulizer solution    Sig: Take 3 mLs (2.5 mg total) by nebulization every 6 (six) hours as needed for wheezing.    Dispense:  75 mL    Refill:  12    Order Specific Question:   Supervising Provider    Answer:   Doree Albee [[id number]]  . albuterol (VENTOLIN HFA) 108 (90 Base) MCG/ACT inhaler    Sig: Inhale 2 puffs into the lungs every 6 (six) hours as needed for wheezing. Shortness of breath    Dispense:  18 g    Refill:  3    Order  Specific Question:   Supervising Provider    Answer:   Anastasio Champion, NIMISH C [[id number]]  . buPROPion (WELLBUTRIN SR) 100 MG 12 hr tablet    Sig: Take 1 tablet (100 mg total) by mouth 2 (two) times daily.    Dispense:  180 tablet    Refill:  1    Order Specific Question:   Supervising Provider    Answer:   Hurshel Party C [[id number]]  . DISCONTD: celecoxib (CELEBREX) 200 MG capsule    Sig: Take 1 capsule (200 mg total) by mouth 2 (two) times daily.    Dispense:  180 capsule    Refill:  1    Order Specific Question:   Supervising Provider    Answer:   Hurshel Party C [[id number]]  . clopidogrel (PLAVIX) 75 MG tablet    Sig: Take 1 tablet (75 mg total) by mouth daily.    Dispense:  90 tablet    Refill:  1    Order Specific Question:   Supervising Provider    Answer:   Hurshel Party C [[id number]]  . docusate sodium (COLACE) 100 MG capsule    Sig: Take 1 capsule (100 mg total) by mouth 2 (two) times daily as needed for mild constipation.    Dispense:  60 capsule    Refill:  2    Order Specific Question:   Supervising Provider    Answer:   Hurshel Party C [[id number]]  . DULoxetine (CYMBALTA) 60 MG capsule    Sig: Take 1 capsule (60 mg total) by mouth daily.    Dispense:  90 capsule    Refill:  1    Order Specific Question:   Supervising Provider    Answer:   Hurshel Party C [[id number]]  . furosemide (LASIX) 80 MG tablet    Sig: Take 1 tablet (80 mg total) by mouth daily.    Dispense:  90 tablet    Refill:  1    Order Specific Question:   Supervising Provider    Answer:   Hurshel Party C [[id number]]  . glipiZIDE (GLUCOTROL) 10 MG tablet    Sig: Take 1 tablet (10 mg total) by mouth in the morning and at bedtime.    Dispense:  180 tablet    Refill:  1    Order Specific Question:   Supervising Provider    Answer:   Hurshel Party C [[id number]]  . insulin aspart (NOVOLOG FLEXPEN) 100 UNIT/ML FlexPen    Sig: Inject 60 units into the skin three times a day with meals.    Dispense:  15 mL    Refill:  11    Order  Specific Question:   Supervising Provider    Answer:   Hurshel Party C [[id number]]  . insulin detemir (LEVEMIR) 100 UNIT/ML injection    Sig: Inject 0.8 mLs (80 Units total) into the skin 2 (two) times daily.    Dispense:  10 mL    Refill:  11    Order Specific Question:   Supervising Provider    Answer:   Hurshel Party C [[id number]]  .  lisinopril (ZESTRIL) 2.5 MG tablet    Sig: Take 1 tablet (2.5 mg total) by mouth daily.    Dispense:  90 tablet    Refill:  2    Order Specific Question:   Supervising Provider    Answer:   Hurshel Party C [[id number]]  . metoprolol tartrate (LOPRESSOR) 50 MG tablet    Sig: Take 1 tablet (50 mg total) by mouth 2 (two) times daily.    Dispense:  180 tablet    Refill:  1    Order Specific Question:   Supervising Provider    Answer:   Hurshel Party C [[id number]]  . pantoprazole (PROTONIX) 40 MG tablet    Sig: Take 1 tablet (40 mg total) by mouth daily.    Dispense:  90 tablet    Refill:  2    Order Specific Question:   Supervising Provider    Answer:   Hurshel Party C [[id number]]  . simvastatin (ZOCOR) 80 MG tablet    Sig: Take 1 tablet (80 mg total) by mouth daily.    Dispense:  90 tablet    Refill:  1    Order Specific Question:   Supervising Provider    Answer:   Hurshel Party C [[id number]]  . TRULICITY 1.5 OI/3.2PQ SOPN    Sig: Inject 1.5 mg into the skin once a week.    Dispense:  2 mL    Refill:  6    Order Specific Question:   Supervising Provider    Answer:   Hurshel Party C [[id number]]  . celecoxib (CELEBREX) 200 MG capsule    Sig: Take 1 capsule (200 mg total) by mouth 2 (two) times daily.    Dispense:  180 capsule    Refill:  1    Order Specific Question:   Supervising Provider    Answer:   Doree Albee [[id number]]    Patient to follow-up as scheduled with Dr. Anastasio Champion or sooner as needed.  Ailene Ards, NP

## 2020-07-31 ENCOUNTER — Telehealth (INDEPENDENT_AMBULATORY_CARE_PROVIDER_SITE_OTHER): Payer: Self-pay

## 2020-07-31 ENCOUNTER — Other Ambulatory Visit (INDEPENDENT_AMBULATORY_CARE_PROVIDER_SITE_OTHER): Payer: Self-pay | Admitting: Nurse Practitioner

## 2020-07-31 DIAGNOSIS — E669 Obesity, unspecified: Secondary | ICD-10-CM

## 2020-07-31 DIAGNOSIS — E1169 Type 2 diabetes mellitus with other specified complication: Secondary | ICD-10-CM

## 2020-07-31 LAB — COMPLETE METABOLIC PANEL WITH GFR
AG Ratio: 1.3 (calc) (ref 1.0–2.5)
ALT: 20 U/L (ref 6–29)
AST: 31 U/L (ref 10–35)
Albumin: 3.9 g/dL (ref 3.6–5.1)
Alkaline phosphatase (APISO): 107 U/L (ref 37–153)
BUN: 20 mg/dL (ref 7–25)
CO2: 27 mmol/L (ref 20–32)
Calcium: 9.5 mg/dL (ref 8.6–10.4)
Chloride: 99 mmol/L (ref 98–110)
Creat: 0.86 mg/dL (ref 0.50–1.05)
GFR, Est African American: 88 mL/min/{1.73_m2} (ref >=60)
GFR, Est Non African American: 76 mL/min/{1.73_m2} (ref >=60)
Globulin: 3 g/dL (calc) (ref 1.9–3.7)
Glucose, Bld: 368 mg/dL — ABNORMAL HIGH (ref 65–99)
Potassium: 4.4 mmol/L (ref 3.5–5.3)
Sodium: 137 mmol/L (ref 135–146)
Total Bilirubin: 0.4 mg/dL (ref 0.2–1.2)
Total Protein: 6.9 g/dL (ref 6.1–8.1)

## 2020-07-31 LAB — CBC
HCT: 41.2 % (ref 35.0–45.0)
Hemoglobin: 12.8 g/dL (ref 11.7–15.5)
MCH: 28.4 pg (ref 27.0–33.0)
MCHC: 31.1 g/dL — ABNORMAL LOW (ref 32.0–36.0)
MCV: 91.4 fL (ref 80.0–100.0)
MPV: 11 fL (ref 7.5–12.5)
Platelets: 221 10*3/uL (ref 140–400)
RBC: 4.51 10*6/uL (ref 3.80–5.10)
RDW: 14.4 % (ref 11.0–15.0)
WBC: 12.5 10*3/uL — ABNORMAL HIGH (ref 3.8–10.8)

## 2020-07-31 LAB — VITAMIN D 25 HYDROXY (VIT D DEFICIENCY, FRACTURES): Vit D, 25-Hydroxy: 23 ng/mL — ABNORMAL LOW (ref 30–100)

## 2020-07-31 MED ORDER — LEVEMIR FLEXTOUCH 100 UNIT/ML ~~LOC~~ SOPN: 80.0000 [IU] | PEN_INJECTOR | Freq: Two times a day (BID) | SUBCUTANEOUS | 1 refills | Status: DC

## 2020-07-31 NOTE — Telephone Encounter (Signed)
Patient called and left a detailed voice message about needing a wheelchair referral.  Called patient and she stated that she has been calling places to accept her insurance for a wheelchair. She found somewhere in [facility] and she is going to call back because she does not know the name or number to the place she needs the referral sent.

## 2020-08-06 DIAGNOSIS — R531 Weakness: Secondary | ICD-10-CM | POA: Diagnosis not present

## 2020-08-06 DIAGNOSIS — A419 Sepsis, unspecified organism: Secondary | ICD-10-CM | POA: Diagnosis not present

## 2020-08-06 DIAGNOSIS — M86172 Other acute osteomyelitis, left ankle and foot: Secondary | ICD-10-CM | POA: Diagnosis not present

## 2020-08-11 ENCOUNTER — Other Ambulatory Visit: Payer: Self-pay

## 2020-08-11 ENCOUNTER — Ambulatory Visit (INDEPENDENT_AMBULATORY_CARE_PROVIDER_SITE_OTHER): Payer: Medicare HMO

## 2020-08-11 NOTE — Progress Notes (Deleted)
Subjective:   Nicole [name] is a 55 y.o. female who presents for an Initial Medicare Annual Wellness Visit.  Review of Systems    N/A        Objective:    There were no vitals filed for this visit. There is no height or weight on file to calculate BMI.  Advanced Directives 01/22/2020 01/08/2020 12/06/2019 12/05/2019 04/10/2012 11/10/2011 02/19/2011  Does Patient Have a Medical Advance Directive? No No No No Patient has advance directive, copy not in chart Patient has advance directive, copy not in chart Patient does not have advance directive  Type of Advance Directive - - - - Healthcare Power of [employer] Healthcare Power of Attorney -  Copy of Healthcare Power of Attorney in Chart? - - - - Copy requested from other (Comment) Copy requested from family -  Would patient like information on creating a medical advance directive? No - Patient declined No - Patient declined No - Patient declined - - - -  Pre-existing out of facility DNR order (yellow form or pink MOST form) - - - - No No No    Current Medications (verified) Outpatient Encounter Medications as of 08/11/2020  Medication Sig   acetaminophen (TYLENOL) 500 MG tablet Take 1 tablet (500 mg total) by mouth every 6 (six) hours as needed for moderate pain.   albuterol (PROVENTIL) (2.5 MG/3ML) 0.083% nebulizer solution Take 3 mLs (2.5 mg total) by nebulization every 6 (six) hours as needed for wheezing.   albuterol (VENTOLIN HFA) 108 (90 Base) MCG/ACT inhaler Inhale 2 puffs into the lungs every 6 (six) hours as needed for wheezing. Shortness of breath   apixaban (ELIQUIS) 5 MG TABS tablet Take 1 tablet (5 mg total) by mouth 2 (two) times daily.   buPROPion (WELLBUTRIN SR) 100 MG 12 hr tablet Take 1 tablet (100 mg total) by mouth 2 (two) times daily.   celecoxib (CELEBREX) 200 MG capsule Take 1 capsule (200 mg total) by mouth 2 (two) times daily.   Cholecalciferol 25 MCG (1000 UT) capsule Take 1,000 Units by mouth daily.    clopidogrel (PLAVIX) 75 MG tablet Take 1 tablet (75 mg total) by mouth daily.   docusate sodium (COLACE) 100 MG capsule Take 1 capsule (100 mg total) by mouth 2 (two) times daily as needed for mild constipation.   DULoxetine (CYMBALTA) 60 MG capsule Take 1 capsule (60 mg total) by mouth daily.   EASY TOUCH INSULIN SYRINGE 31G X 5/16" 1 ML MISC    EPINEPHrine 0.3 mg/0.3 mL IJ SOAJ injection Inject 0.3 mg into the muscle as needed for anaphylaxis.   furosemide (LASIX) 80 MG tablet Take 1 tablet (80 mg total) by mouth daily.   glipiZIDE (GLUCOTROL) 10 MG tablet Take 1 tablet (10 mg total) by mouth in the morning and at bedtime.   insulin aspart (NOVOLOG FLEXPEN) 100 UNIT/ML FlexPen Inject 60 units into the skin three times a day with meals.   insulin detemir (LEVEMIR FLEXTOUCH) 100 UNIT/ML FlexPen Inject 80 Units into the skin 2 (two) times daily.   lisinopril (ZESTRIL) 2.5 MG tablet Take 1 tablet (2.5 mg total) by mouth daily.   loperamide (IMODIUM) 2 MG capsule Take 1 capsule (2 mg total) by mouth every 6 (six) hours as needed for diarrhea or loose stools.   metoprolol tartrate (LOPRESSOR) 50 MG tablet Take 1 tablet (50 mg total) by mouth 2 (two) times daily.   Multiple Vitamins-Calcium (ONE-A-DAY WOMENS FORMULA) TABS Take 1 tablet by mouth daily.  mupirocin ointment (BACTROBAN) 2 % Apply topically daily.   oxyCODONE (OXY IR/ROXICODONE) 5 MG immediate release tablet Take 1 tablet (5 mg total) by mouth every 6 (six) hours as needed for severe pain.   pantoprazole (PROTONIX) 40 MG tablet Take 1 tablet (40 mg total) by mouth daily.   senna-docusate (SENOKOT-S) 8.6-50 MG tablet Take 1 tablet by mouth at bedtime as needed for mild constipation.   simvastatin (ZOCOR) 80 MG tablet Take 1 tablet (80 mg total) by mouth daily.   TRULICITY 1.5 MG/0.5ML SOPN Inject 1.5 mg into the skin once a week.   No facility-administered encounter medications on file as of 08/11/2020.    Allergies (verified) Aspirin,  Bee pollen, Bee venom, Food, Pineapple, E-mycin [erythromycin base], and Other   History: Past Medical History:  Diagnosis Date   CAP (community acquired pneumonia)    Streptococcus 01/2011   COPD (chronic obstructive pulmonary disease) (HCC)    Coronary atherosclerosis of native coronary artery    Diagnosed [place] 2006 - DES RCA, reports followup cath 2009 at [facility]    Dyslipidemia    Essential hypertension, benign    Morbid obesity (HCC)    Pancreatitis    Type 2 diabetes mellitus (HCC)    Past Surgical History:  Procedure Laterality Date   ABDOMINAL HERNIA REPAIR     ABDOMINAL HYSTERECTOMY     AMPUTATION Left 01/25/2020   Procedure: LEFT BELOW KNEE AMPUTATION;  Surgeon: [name], MD;  Location: [facility] OR;  Service: Orthopedics;  Laterality: Left;   CORONARY ANGIOPLASTY WITH STENT PLACEMENT  2006   KNEE SURGERY     NOSE SURGERY     Pilonidal cystectomy     TONSILLECTOMY     Family History  Problem Relation Age of Onset   Diabetes Mother    Heart failure Mother    Hypertension Mother    Kidney failure Mother    Ovarian cancer Mother    Asthma Son    Social History   Socioeconomic History   Marital status: Divorced    Spouse name: Not on file   Number of children: 3   Years of education: Not on file   Highest education level: Not on file  Occupational History   Occupation: Home health aide    Employer: [employer]  Tobacco Use   Smoking status: Former    Packs/day: 0.50    Years: 20.00    Pack years: 10.00    Types: Cigarettes    Quit date: 01/22/2020    Years since quitting: 0.5   Smokeless tobacco: Never  Vaping Use   Vaping Use: Never used  Substance and Sexual Activity   Alcohol use: No   Drug use: No   Sexual activity: Yes    Birth control/protection: Surgical  Other Topics Concern   Not on file  Social History Narrative   Not on file   Social Determinants of Health   Financial Resource Strain: Not on file  Food  Insecurity: Not on file  Transportation Needs: Not on file  Physical Activity: Not on file  Stress: Not on file  Social Connections: Not on file    Tobacco Counseling Counseling given: Not Answered   Clinical Intake:                 Diabetic?Yes  Nutrition Risk Assessment:  Has the patient had any N/V/D within the last 2 months?  {YES/NO:21197} Does the patient have any non-healing wounds?  {YES/NO:21197} Has the patient  had any unintentional weight loss or weight gain?  {YES/NO:21197}  Diabetes:  Is the patient diabetic?  {YES/NO:21197} If diabetic, was a CBG obtained today?  {YES/NO:21197} Did the patient bring in their glucometer from home?  {YES/NO:21197} How often do you monitor your CBG's? ***.   Financial Strains and Diabetes Management:  Are you having any financial strains with the device, your supplies or your medication? {YES/NO:21197}.  Does the patient want to be seen by Chronic Care Management for management of their diabetes?  {YES/NO:21197} Would the patient like to be referred to a Nutritionist or for Diabetic Management?  {YES/NO:21197}  Diabetic Exams:  {Diabetic Eye Exam:2101801} {Diabetic Foot Exam:2101802}          Activities of Daily Living In your present state of health, do you have any difficulty performing the following activities: 01/23/2020 12/06/2019  Hearing? N N  Vision? N N  Difficulty concentrating or making decisions? N N  Walking or climbing stairs? N N  Dressing or bathing? N N  Doing errands, shopping? N N  Some recent data might be hidden    Patient Care Team: [name], MD as PCP - General (Internal Medicine)  Indicate any recent Medical Services you may have received from other than [facility] providers in the past year (date may be approximate).     Assessment:   This is a routine wellness examination for Jackelin.  Hearing/Vision screen No results found.  Dietary issues and exercise activities  discussed:     Goals Addressed   None    Depression Screen PHQ 2/9 Scores 07/30/2020  PHQ - 2 Score 3  PHQ- 9 Score 12    Fall Risk No flowsheet data found.  FALL RISK PREVENTION PERTAINING TO THE HOME:  Any stairs in or around the home? {YES/NO:21197} If so, are there any without handrails? No  Home free of loose throw rugs in walkways, pet beds, electrical cords, etc? Yes  Adequate lighting in your home to reduce risk of falls? Yes   ASSISTIVE DEVICES UTILIZED TO PREVENT FALLS:  Life alert? {YES/NO:21197} Use of a cane, walker or w/c? {YES/NO:21197} Grab bars in the bathroom? {YES/NO:21197} Shower chair or bench in shower? {YES/NO:21197} Elevated toilet seat or a handicapped toilet? {YES/NO:21197}  TIMED UP AND GO:  Was the test performed? {YES/NO:21197}.  Length of time to ambulate 10 feet:  sec.   {Appearance of VBTY:6060045}  Cognitive Function:        Immunizations Immunization History  Administered Date(s) Administered   Influenza,inj,Quad PF,6+ Mos 12/25/2019   Moderna Sars-Covid-2 Vaccination 06/25/2019, 07/25/2019   Pneumococcal Polysaccharide-23 04/11/2012, 12/25/2019    TDAP status: Due, Education has been provided regarding the importance of this vaccine. Advised may receive this vaccine at local pharmacy or Health Dept. Aware to provide a copy of the vaccination record if obtained from local pharmacy or Health Dept. Verbalized acceptance and understanding.  Flu Vaccine status: Up to date  Pneumococcal vaccine status: Up to date  Covid-19 vaccine status: Completed vaccines  Qualifies for Shingles Vaccine? Yes   Zostavax completed No   Shingrix Completed?: No.    Education has been provided regarding the importance of this vaccine. Patient has been advised to call insurance company to determine out of pocket expense if they have not yet received this vaccine. Advised may also receive vaccine at local pharmacy or Health Dept. Verbalized acceptance  and understanding.  Screening Tests Health Maintenance  Topic Date Due   Pneumococcal Vaccine 81-70 Years old (1 -  PCV) Never done   FOOT EXAM  Never done   OPHTHALMOLOGY EXAM  Never done   Hepatitis C Screening  Never done   TETANUS/TDAP  Never done   PAP SMEAR-Modifier  Never done   COLONOSCOPY (Pts 45-40yrs Insurance coverage will need to be confirmed)  Never done   MAMMOGRAM  02/25/2014   Zoster Vaccines- Shingrix (1 of 2) Never done   COVID-19 Vaccine (3 - Booster for Moderna series) 12/25/2019   INFLUENZA VACCINE  09/22/2020   HEMOGLOBIN A1C  11/15/2020   PNEUMOCOCCAL POLYSACCHARIDE VACCINE AGE 56-64 HIGH RISK  Completed   HIV Screening  Completed   HPV VACCINES  Aged Out    Health Maintenance  Health Maintenance Due  Topic Date Due   Pneumococcal Vaccine 41-27 Years old (1 - PCV) Never done   FOOT EXAM  Never done   OPHTHALMOLOGY EXAM  Never done   Hepatitis C Screening  Never done   TETANUS/TDAP  Never done   PAP SMEAR-Modifier  Never done   COLONOSCOPY (Pts 45-32yrs Insurance coverage will need to be confirmed)  Never done   MAMMOGRAM  02/25/2014   Zoster Vaccines- Shingrix (1 of 2) Never done   COVID-19 Vaccine (3 - Booster for Moderna series) 12/25/2019    {Colorectal cancer screening:2101809}  Mammogram status: Ordered 08/11/2020. Pt provided with contact info and advised to call to schedule appt.   Bone Density Status: Not due until age 69  Lung Cancer Screening: (Low Dose CT Chest recommended if Age 62-80 years, 30 pack-year currently smoking OR have quit w/in 15years.) does not qualify.   Lung Cancer Screening Referral: N/A   Additional Screening:  Hepatitis C Screening: does qualify;   Vision Screening: Recommended annual ophthalmology exams for early detection of glaucoma and other disorders of the eye. Is the patient up to date with their annual eye exam?  {YES/NO:21197} Who is the provider or what is the name of the office in which the patient  attends annual eye exams? *** If pt is not established with a provider, would they like to be referred to a provider to establish care? {YES/NO:21197}.   Dental Screening: Recommended annual dental exams for proper oral hygiene  Community Resource Referral / Chronic Care Management: CRR required this visit?  No   CCM required this visit?  No      Plan:     I have personally reviewed and noted the following in the patient's chart:   Medical and social history Use of alcohol, tobacco or illicit drugs  Current medications and supplements including opioid prescriptions. {Opioid Prescriptions:[phone]} Functional ability and status Nutritional status Physical activity Advanced directives List of other physicians Hospitalizations, surgeries, and ER visits in previous 12 months Vitals Screenings to include cognitive, depression, and falls Referrals and appointments  In addition, I have reviewed and discussed with patient certain preventive protocols, quality metrics, and best practice recommendations. A written personalized care plan for preventive services as well as general preventive health recommendations were provided to patient.     Theodora Blow, LPN   2/99/3716   Nurse Notes: ***

## 2020-08-18 ENCOUNTER — Ambulatory Visit (INDEPENDENT_AMBULATORY_CARE_PROVIDER_SITE_OTHER): Payer: Medicare HMO | Admitting: Internal Medicine

## 2020-08-22 ENCOUNTER — Ambulatory Visit: Payer: Medicare HMO | Attending: Physician Assistant

## 2020-08-22 NOTE — Therapy (Deleted)
[facility] [address] [place], [place], [address] Phone: [phone]   Fax:  [phone]  Patient Details  Name: Nicole [name] MRN: [id number] Date of Birth: 25-Sep-1965 Referring Provider:  Persons, [place], [place]  Encounter Date: 08/22/2020  Patient came in 30 min late to her PT evaluation. Patient lives in [place]. Pt preferred to go to [facility] clinic as it is closer to her home. [facility] was contacted and they will contact patient to schedule initial evaluation. Patient and daughter were also educated and given [facility]'s phone number and address and educated to contact them if they don't call them back in 2 business days.  Ileana Ladd, PT 08/22/2020, 11:03 AM  [facility] [address] [place], [place], [address] Phone: [phone]   Fax:  [phone]

## 2020-09-01 ENCOUNTER — Telehealth (INDEPENDENT_AMBULATORY_CARE_PROVIDER_SITE_OTHER): Payer: Self-pay

## 2020-09-01 DIAGNOSIS — J449 Chronic obstructive pulmonary disease, unspecified: Secondary | ICD-10-CM | POA: Diagnosis not present

## 2020-09-01 DIAGNOSIS — Z89512 Acquired absence of left leg below knee: Secondary | ICD-10-CM | POA: Diagnosis not present

## 2020-09-01 DIAGNOSIS — I5030 Unspecified diastolic (congestive) heart failure: Secondary | ICD-10-CM | POA: Diagnosis not present

## 2020-09-01 NOTE — Telephone Encounter (Signed)
Patient called and left a voice message and stated that her daughter was going to drop off some paperwork to be completed and she stated that she is doing physical therapy.  Patient stated that she is trying to get home health to come in to help her such as taking a bath and getting in and out of the tub and Medicaid sent her some paperwork to be completed. The paperwork needs to be checked yes since patient is starting physical therapy for her leg and is starting on 09/18/2020.  Sending as Lorain Childes. Patient stated that she thought you would be able to complete but if you need to do a visit with her she said she can.

## 2020-09-04 ENCOUNTER — Encounter (HOSPITAL_COMMUNITY): Payer: Self-pay | Admitting: Physical Therapy

## 2020-09-04 NOTE — Therapy (Unsigned)
[place], [place], [address] Phone: [phone]   Fax:  [phone]  Patient Details  Name: LASHONNE SHULL MRN: [id number] Date of Birth: Oct 25, 1965 Referring Provider:  No ref. provider found  Encounter Date: 09/04/2020 PHYSICAL THERAPY DISCHARGE SUMMARY  Visits from Start of Care: 1  Current functional level related to goals / functional outcomes: Not met    Remaining deficits: Pt ended up needing an amputation    Education / Equipment: Keep wound clean and coveredl.   Patient agrees to discharge. Patient goals were not met. Patient is being discharged due to a change in medical status.   Rayetta Humphrey, PT CLT [phone]  09/04/2020, 10:34 AM  [place], [place], [address] Phone: [phone]   Fax:  [phone]

## 2020-09-09 ENCOUNTER — Encounter (INDEPENDENT_AMBULATORY_CARE_PROVIDER_SITE_OTHER): Payer: Self-pay | Admitting: Nurse Practitioner

## 2020-09-09 DIAGNOSIS — Z89512 Acquired absence of left leg below knee: Secondary | ICD-10-CM | POA: Insufficient documentation

## 2020-09-09 NOTE — Telephone Encounter (Signed)
This encounter was created in error - please disregard.

## 2020-09-16 ENCOUNTER — Ambulatory Visit (INDEPENDENT_AMBULATORY_CARE_PROVIDER_SITE_OTHER): Payer: Medicare HMO | Admitting: Internal Medicine

## 2020-09-18 ENCOUNTER — Ambulatory Visit (HOSPITAL_COMMUNITY): Payer: Medicare HMO | Attending: Physician Assistant | Admitting: Physical Therapy

## 2020-09-18 ENCOUNTER — Other Ambulatory Visit: Payer: Self-pay

## 2020-09-18 DIAGNOSIS — Z89512 Acquired absence of left leg below knee: Secondary | ICD-10-CM

## 2020-09-18 DIAGNOSIS — R262 Difficulty in walking, not elsewhere classified: Secondary | ICD-10-CM

## 2020-09-18 NOTE — Therapy (Signed)
[facility] [address] [place], [place], [address] Phone: [phone]   Fax:  [phone]  Physical Therapy Evaluation  Patient Details  Name: Nicole [name] MRN: [id number] Date of Birth: 02/05/1966 Referring Provider (PT): West Bali Persons   Encounter Date: 09-27-2020   PT End of Session - September 27, 2020 1117     Visit Number 1    Number of Visits 18    Date for PT Re-Evaluation 11/13/20    Authorization Type Human[employer]    PT Start Time 1050    PT Stop Time 1130    PT Time Calculation (min) 40 min    Activity Tolerance Patient tolerated treatment well    Behavior During Therapy [facility] for tasks assessed/performed             Past Medical History:  Diagnosis Date   CAP (community acquired pneumonia)    Streptococcus 01/2011   COPD (chronic obstructive pulmonary disease) (HCC)    Coronary atherosclerosis of native coronary artery    Diagnosed [place] 2006 - DES RCA, reports followup cath 2009 at [facility]    Dyslipidemia    Essential hypertension, benign    Morbid obesity (HCC)    Pancreatitis    Type 2 diabetes mellitus (HCC)     Past Surgical History:  Procedure Laterality Date   ABDOMINAL HERNIA REPAIR     ABDOMINAL HYSTERECTOMY     AMPUTATION Left 01/25/2020   Procedure: LEFT BELOW KNEE AMPUTATION;  Surgeon: [name], MD;  Location: [facility] OR;  Service: Orthopedics;  Laterality: Left;   CORONARY ANGIOPLASTY WITH STENT PLACEMENT  2006   KNEE SURGERY     NOSE SURGERY     Pilonidal cystectomy     TONSILLECTOMY      There were no vitals filed for this visit.    Subjective Assessment - 09-27-2020 1104     Subjective Nicole [name] is a 55 yo diabetic who had a wound on her Lt foot that ultimately ended up as a BKA on 01/25/2020.  The pt stayed as an IP until 12/15 and then went home.  She was supposse to have home health but they never came.  She does not don or doff her own prothesis.   She is currently does not  walk in  her home with her prothesis and her walker.    Pertinent History COPD, HTN, obesity, DM, CAD , Afib    How long can you sit comfortably? no problem    How long can you stand comfortably? one minute    How long can you walk comfortably? one minute    Patient Stated Goals Pt states that she wants to be able to walk without a walker and be able to do more than what she is.    Currently in Pain? Yes    Pain Score 8     Pain Location Leg    Pain Orientation Left    Pain Descriptors / Indicators Aching    Pain Type Chronic pain    Pain Onset More than a month ago    Pain Frequency Intermittent    Aggravating Factors  WB    Pain Relieving Factors rest    Effect of Pain on Daily Activities limts                [facility] PT Assessment - 2020-09-27 0001       Assessment   Medical Diagnosis Difficlty walking due to Lt BKA  Referring Provider (PT) West BaliMary Anne Persons    Onset Date/Surgical Date 01/25/20      Precautions   Precautions Fall      Restrictions   Weight Bearing Restrictions No      Balance Screen   Has the patient fallen in the past 6 months Yes    How many times? 4    Has the patient had a decrease in activity level because of a fear of falling?  Yes    Is the patient reluctant to leave their home because of a fear of falling?  No      Home Environment   Living Environment Private residence    Home Access Stairs to enter    Entrance Stairs-Number of Steps 6 currently going up sideways    Home Layout One level      Prior Function   Level of Independence Independent      Cognition   Overall Cognitive Status Within Functional Limits for tasks assessed      ROM / Strength   AROM / PROM / Strength Strength      Strength   Strength Assessment Site Knee;Hip    Right/Left Hip Right;Left    Right Hip Flexion 4/5    Right Hip Extension 4+/5    Right Hip ABduction 5/5    Left Hip Flexion 4/5    Left Hip Extension 4+/5    Left Hip ABduction 5/5    Right/Left Knee  Right;Left    Right Knee Flexion 5/5    Right Knee Extension 5/5    Left Knee Flexion 5/5    Left Knee Extension 5/5      Ambulation/Gait   Ambulation Distance (Feet) 56 Feet    Gait Comments 2' walk test;   pt needed to rest after 1minute unable to resume                       Objective measurements completed on examination: See above findings.               PT Education - 09/18/20 1117     Education Details The importance of walking everyday and learning to don and doff prothesis    Person(s) Educated Patient    Methods Explanation    Comprehension Verbalized understanding              PT Short Term Goals - 09/18/20 1122       PT SHORT TERM GOAL #1   Title Pt to be able to don and doff prothesis I    Time 3    Period Weeks    Status New    Target Date 10/09/20      PT SHORT TERM GOAL #2   Title Pt to be able to ambulate for five minutes to be able to get out of the house on her own if needed.    Time 3      PT SHORT TERM GOAL #3   Title Pt to be able to tolerate having her prothesis on for four hours a day.    Time 3    Period Weeks    Status New               PT Long Term Goals - 09/18/20 1126       PT LONG TERM GOAL #1   Title PT to be able to walk with least assistive device and prothesis for 15.    Time 9  Period Weeks    Status New    Target Date 11/06/20      PT LONG TERM GOAL #2   Title Pt balance to be improved to be able to go up and down 6 steps without sidestepping to get in and out of her home.    Time 9    Period Weeks    Status New    Target Date 11/06/20                    Plan - 09/18/20 1133     Clinical Impression Statement Nicole [name] is a 55 yo female who had a Lt BKA on 01/29/2020 she was discharged from acute treatment on 12/15/2021and never recieved any further therapy.  She is being refered for skilled therapy to improve her ability to ambulate and her independence.  Evaluation  demonstated decreased actiivty tolerance, decreased balance and decrease I in using her prothesis.  Ms. Wake will benefit from skilled PT to address these issues and improve her I in ambulation.    Personal Factors and Comorbidities Comorbidity 3+;Fitness;Past/Current Experience;Time since onset of injury/illness/exacerbation    Comorbidities DM, CHF, HTN, AFIB, COPD    Examination-Activity Limitations Dressing;Geophysical data processor for Others;Carry;Stand;Stairs;Squat;Toileting;Transfers    Examination-Participation Restrictions Church;Cleaning;Community Activity;Driving;Laundry    Stability/Clinical Decision Making Stable/Uncomplicated    Clinical Decision Making Moderate    Rehab Potential Good    PT Frequency 2x / week    PT Duration --   9 weeks   PT Treatment/Interventions ADLs/Self Care Home Management;Manual techniques;Other (comment);Patient/family education;Therapeutic activities;Therapeutic exercise;Functional mobility training;Stair training;Gait training    PT Next Visit Plan Gt training, standing balance activity, stair training.    PT Home Exercise Plan increase wearing time of prothesis, walk everyday             Patient will benefit from skilled therapeutic intervention in order to improve the following deficits and impairments:  Cardiopulmonary status limiting activity, Decreased activity tolerance, Decreased balance, Difficulty walking, Decreased mobility, Pain, Obesity, Impaired perceived functional ability  Visit Diagnosis: Left below-knee amputee (HCC) - Plan: PT plan of care cert/re-cert  Difficulty in walking, not elsewhere classified - Plan: PT plan of care cert/re-cert     Problem List Patient Active Problem List   Diagnosis Date Noted   Hx of BKA, left (HCC) 09/09/2020   Vitamin D deficiency 07/30/2020   Chronic respiratory failure with hypoxia (HCC) 07/30/2020   Depression 07/30/2020   Atrial fibrillation (HCC) 07/30/2020   Acute osteomyelitis of  toe, left (HCC)    Sepsis due to Lt foot osteo 01/23/2020   Morbid obesity with body mass index (BMI) of 50.0 to 59.9 in adult (HCC) 01/23/2020   Tobacco abuse--2 P/day Smoker 01/23/2020   Cellulitis of left foot 01/22/2020   Osteomyelitis of left foot (HCC) 01/22/2020   Leukocytosis 01/22/2020   Hypokalemia 01/22/2020   Hypoalbuminemia 01/22/2020   Hyperglycemia due to diabetes mellitus (HCC) 01/22/2020   Elevated alpha fetoprotein 01/22/2020   SIRS (systemic inflammatory response syndrome) (HCC) 01/22/2020   Diabetic ulcer of left foot (HCC) 12/05/2019   Cough 05/26/2012   TIA (transient ischemic attack) 04/10/2012   Acute diastolic congestive heart failure (HCC) 11/12/2011   COPD (chronic obstructive pulmonary disease) (HCC) 11/09/2011   Dyspnea 06/10/2011   CAP (community acquired pneumonia) 02/19/2011   Coronary atherosclerosis of native coronary artery/Stent in 2006 02/19/2011   Dyslipidemia    Morbid obesity (HCC)    Diabetes mellitus type 2 in obese (HCC)  HTN (hypertension), benign    Virgina Organ, [place] CLT [phone]  09/18/2020, 12:06 PM  [facility] [address] [place], [place], [address] Phone: [phone]   Fax:  [phone]  Name: CLARINE ELROD MRN: [id number] Date of Birth: 07-07-1965

## 2020-09-22 DIAGNOSIS — R531 Weakness: Secondary | ICD-10-CM | POA: Diagnosis not present

## 2020-09-22 DIAGNOSIS — M86172 Other acute osteomyelitis, left ankle and foot: Secondary | ICD-10-CM | POA: Diagnosis not present

## 2020-09-22 DIAGNOSIS — A419 Sepsis, unspecified organism: Secondary | ICD-10-CM | POA: Diagnosis not present

## 2020-09-24 DIAGNOSIS — G4733 Obstructive sleep apnea (adult) (pediatric): Secondary | ICD-10-CM | POA: Diagnosis not present

## 2020-09-25 ENCOUNTER — Other Ambulatory Visit: Payer: Self-pay

## 2020-09-25 ENCOUNTER — Ambulatory Visit (HOSPITAL_COMMUNITY): Payer: Medicare HMO | Attending: Physician Assistant | Admitting: Physical Therapy

## 2020-09-25 DIAGNOSIS — Z89512 Acquired absence of left leg below knee: Secondary | ICD-10-CM | POA: Diagnosis not present

## 2020-09-25 DIAGNOSIS — R262 Difficulty in walking, not elsewhere classified: Secondary | ICD-10-CM | POA: Diagnosis not present

## 2020-09-25 NOTE — Therapy (Signed)
[facility] [address] [place], [place], [address] Phone: [phone]   Fax:  [phone]  Physical Therapy Treatment  Patient Details  Name: Nicole [name] MRN: [id number] Date of Birth: 31-May-1965 Referring Provider (PT): West Bali Persons   Encounter Date: 09/25/2020   PT End of Session - 09/25/20 1133     Visit Number 2    Number of Visits 18    Date for PT Re-Evaluation 11/13/20    Authorization Type Human[employer]    Progress Note Due on Visit 10    PT Start Time 1125    PT Stop Time 1208    PT Time Calculation (min) 43 min             Past Medical History:  Diagnosis Date   CAP (community acquired pneumonia)    Streptococcus 01/2011   COPD (chronic obstructive pulmonary disease) (HCC)    Coronary atherosclerosis of native coronary artery    Diagnosed [place] 2006 - DES RCA, reports followup cath 2009 at [facility]    Dyslipidemia    Essential hypertension, benign    Morbid obesity (HCC)    Pancreatitis    Type 2 diabetes mellitus (HCC)     Past Surgical History:  Procedure Laterality Date   ABDOMINAL HERNIA REPAIR     ABDOMINAL HYSTERECTOMY     AMPUTATION Left 01/25/2020   Procedure: LEFT BELOW KNEE AMPUTATION;  Surgeon: [name], MD;  Location: [facility] OR;  Service: Orthopedics;  Laterality: Left;   CORONARY ANGIOPLASTY WITH STENT PLACEMENT  2006   KNEE SURGERY     NOSE SURGERY     Pilonidal cystectomy     TONSILLECTOMY      There were no vitals filed for this visit.   Subjective Assessment - 09/25/20 1128     Subjective Pt states she is getting her prosthesis on with help of family.  States she's been working on doing it independently.  STates she is using it more at home and walking around the house.  Comes today using her prosthesis and walker.    Currently in Pain? No/denies                               [facility] Adult PT Treatment/Exercise - 09/25/20 0001       Ambulation/Gait    Ambulation Distance (Feet) 150 Feet    Assistive device Prosthesis;Rolling walker    Gait Pattern Step-through pattern    Ambulation Surface Level    Gait velocity in 2 minutes    Gait Comments completed 2 bouts of 2 minutes      Knee/Hip Exercises: Standing   Other Standing Knee Exercises tandem in corner 30" each LE lead      Knee/Hip Exercises: Seated   Sit to Sand 10 reps;without UE support      Prosthetics   Current prosthetic wear tolerance (days/week)  daily    Current prosthetic wear tolerance (#hours/day)  6-8 hours    Education Provided Correct ply sock adjustment    Person(s) Educated Patient    Education Method Explanation;Demonstration    Education Method Verbalized understanding                    PT Education - 09/25/20 1219     Education Details goals, increased ply with prosthesis, HEP distribution/instruction    Person(s) Educated Patient    Methods Explanation;Demonstration;Tactile cues;Verbal cues;Handout  Comprehension Verbalized understanding;Returned demonstration;Verbal cues required;Tactile cues required;Need further instruction              PT Short Term Goals - 09/25/20 1134       PT SHORT TERM GOAL #1   Title Pt to be able to don and doff prothesis I    Time 3    Period Weeks    Status On-going    Target Date 10/09/20      PT SHORT TERM GOAL #2   Title Pt to be able to ambulate for five minutes to be able to get out of the house on her own if needed.    Time 3    Period Weeks    Status On-going      PT SHORT TERM GOAL #3   Title Pt to be able to tolerate having her prothesis on for four hours a day.    Time 3    Period Weeks    Status Achieved               PT Long Term Goals - 09/25/20 1136       PT LONG TERM GOAL #1   Title PT to be able to walk with least assistive device and prothesis for 15 minutes.    Time 9    Period Weeks    Status On-going      PT LONG TERM GOAL #2   Title Pt balance to be  improved to be able to go up and down 6 steps without sidestepping to get in and out of her home.    Time 9    Period Weeks    Status On-going                   Plan - 09/25/20 1221     Clinical Impression Statement Reviewed goals and POC moving forward.  Pt is currently able to wear her prosthesis all day and is able to don/doff independently 75% of the time.  States she is checking her Rt foot and residual limb for breakdown daily.  States she plans on making appt at prosthetist as her prosthesis is sliding around some (due to atrophy).  Leg began to slide sideways during second bout of ambulation and added additional layer over socket.  She only used a 1 ply sock which was still not enough; therapist recommended using 3 ply.  Pt was also unable to get her sock on due to inability to reach that far.  She was able to don her prosthesis without therapist assist and locking it in by standing on it.  Pt able to ambulate max of 2 minutes and covered distance of 150 feet each time which was nearly 3X the distance she completed in that time last visit.  Pt given HEP to include tandem balance in corner and working on Sit to stands without UE and balance following.  Pt overall doing well.    Personal Factors and Comorbidities Comorbidity 3+;Fitness;Past/Current Experience;Time since onset of injury/illness/exacerbation    Comorbidities DM, CHF, HTN, AFIB, COPD    Examination-Activity Limitations Dressing;Geophysical data processor for Others;Carry;Stand;Stairs;Squat;Toileting;Transfers    Examination-Participation Restrictions Church;Cleaning;Community Activity;Driving;Laundry    Stability/Clinical Decision Making Stable/Uncomplicated    Rehab Potential Good    PT Frequency 2x / week    PT Duration --   9 weeks   PT Treatment/Interventions ADLs/Self Care Home Management;Manual techniques;Other (comment);Patient/family education;Therapeutic activities;Therapeutic exercise;Functional mobility  training;Stair training;Gait training    PT Next Visit Plan Gt  training, standing balance activity, stair training.  Next session work on stair negotiation and trial of SPC when appropriate.    PT Home Exercise Plan increase wearing time of prothesis, walk everyday             Patient will benefit from skilled therapeutic intervention in order to improve the following deficits and impairments:  Cardiopulmonary status limiting activity, Decreased activity tolerance, Decreased balance, Difficulty walking, Decreased mobility, Pain, Obesity, Impaired perceived functional ability  Visit Diagnosis: Left below-knee amputee (HCC)  Difficulty in walking, not elsewhere classified     Problem List Patient Active Problem List   Diagnosis Date Noted   Hx of BKA, left (HCC) 09/09/2020   Vitamin D deficiency 07/30/2020   Chronic respiratory failure with hypoxia (HCC) 07/30/2020   Depression 07/30/2020   Atrial fibrillation (HCC) 07/30/2020   Acute osteomyelitis of toe, left (HCC)    Sepsis due to Lt foot osteo 01/23/2020   Morbid obesity with body mass index (BMI) of 50.0 to 59.9 in adult (HCC) 01/23/2020   Tobacco abuse--2 P/day Smoker 01/23/2020   Cellulitis of left foot 01/22/2020   Osteomyelitis of left foot (HCC) 01/22/2020   Leukocytosis 01/22/2020   Hypokalemia 01/22/2020   Hypoalbuminemia 01/22/2020   Hyperglycemia due to diabetes mellitus (HCC) 01/22/2020   Elevated alpha fetoprotein 01/22/2020   SIRS (systemic inflammatory response syndrome) (HCC) 01/22/2020   Diabetic ulcer of left foot (HCC) 12/05/2019   Cough 05/26/2012   TIA (transient ischemic attack) 04/10/2012   Acute diastolic congestive heart failure (HCC) 11/12/2011   COPD (chronic obstructive pulmonary disease) (HCC) 11/09/2011   Dyspnea 06/10/2011   CAP (community acquired pneumonia) 02/19/2011   Coronary atherosclerosis of native coronary artery/Stent in 2006 02/19/2011   Dyslipidemia    Morbid obesity (HCC)     Diabetes mellitus type 2 in obese (HCC)    HTN (hypertension), benign    Lurena Nida, PTA/CLT [phone]  Lurena Nida 09/25/2020, 12:25 PM  [facility] [address] [place], [place], [address] Phone: [phone]   Fax:  [phone]  Name: Nicole [name] MRN: [id number] Date of Birth: 11/20/1965

## 2020-09-25 NOTE — Patient Instructions (Signed)
Functional Quadriceps: Sit to Stand    Sit on edge of chair, feet flat on floor. Stand upright, extending knees fully.  Repeat _10_ times per set.  Do _2_ sessions per day.  Feet Heel-Toe "Tandem"    With eyes open, right foot directly in front of the other, arms out, look straight ahead at a stationary object. Hold _30___ seconds. Repeat __3__ times per session. Do __2__ sessions per day.  Do in a corner so you have the wall to help steady you.

## 2020-10-01 ENCOUNTER — Telehealth (HOSPITAL_COMMUNITY): Payer: Self-pay | Admitting: Physical Therapy

## 2020-10-01 ENCOUNTER — Ambulatory Visit (HOSPITAL_COMMUNITY): Payer: Medicare HMO | Admitting: Physical Therapy

## 2020-10-01 NOTE — Telephone Encounter (Signed)
Pt did not show for appt.  Called and left message regarding missed appointment and reminder for next appt on Friday.  Asked to call and cancel if she cannot make this appt.   Lurena Nida, PTA/CLT [phone]

## 2020-10-02 NOTE — Progress Notes (Signed)
Cardiology Office Note   Date:  10/04/2020   ID:  [name], DOB 1965/09/28, MRN [id number]  PCP:  [name], MD (Inactive)  Cardiologist:   [name], MD   Patient referred for evaluation of CP    Had been seen back in 2013 (Last by [name])    History of Present Illness: Nicole [name] is a 55 y.o. female with a history of HTN, Type II DM, RBBB, HL, obesity and CAD   Last seen in cardiology clinic in 2013   S/P PTCA stent 2009 ([facility]) Seen at [facility]    Echo in 2013 LVEF normal  Myoview in May 2013 was normal    Pt moved away  to [place]    While there had 2 more stents placed [employer])   She says she was also noted to have an irregular herart beat that was treated with meds    She has moved back to [place]   Meds have been changed by Dr [name] and she says since then she has had CP   Pain occurs every day   COmes and goes   Not with activity    When not having discomfort says her breathing is worse  She has dx of COPD  and is supposed to be on oxygen   She is not on oxygen   \  MEdical problems signif for DM x 30 years (since pregnant)   `   Current Meds  Medication Sig   acetaminophen (TYLENOL) 500 MG tablet Take 1 tablet (500 mg total) by mouth every 6 (six) hours as needed for moderate pain.   albuterol (PROVENTIL) (2.5 MG/3ML) 0.083% nebulizer solution Take 3 mLs (2.5 mg total) by nebulization every 6 (six) hours as needed for wheezing.   albuterol (VENTOLIN HFA) 108 (90 Base) MCG/ACT inhaler Inhale 2 puffs into the lungs every 6 (six) hours as needed for wheezing. Shortness of breath   buPROPion (WELLBUTRIN SR) 100 MG 12 hr tablet Take 1 tablet (100 mg total) by mouth 2 (two) times daily.   celecoxib (CELEBREX) 200 MG capsule Take 1 capsule (200 mg total) by mouth 2 (two) times daily.   Cholecalciferol 25 MCG (1000 UT) capsule Take 1,000 Units by mouth daily.   clopidogrel (PLAVIX) 75 MG tablet Take 1 tablet (75 mg total) by mouth daily.   docusate  sodium (COLACE) 100 MG capsule Take 1 capsule (100 mg total) by mouth 2 (two) times daily as needed for mild constipation.   DULoxetine (CYMBALTA) 60 MG capsule Take 1 capsule (60 mg total) by mouth daily.   EASY TOUCH INSULIN SYRINGE 31G X 5/16" 1 ML MISC    EPINEPHrine 0.3 mg/0.3 mL IJ SOAJ injection Inject 0.3 mg into the muscle as needed for anaphylaxis.   furosemide (LASIX) 80 MG tablet Take 1 tablet (80 mg total) by mouth daily.   glipiZIDE (GLUCOTROL) 10 MG tablet Take 1 tablet (10 mg total) by mouth in the morning and at bedtime.   insulin aspart (NOVOLOG FLEXPEN) 100 UNIT/ML FlexPen Inject 60 units into the skin three times a day with meals.   insulin detemir (LEVEMIR FLEXTOUCH) 100 UNIT/ML FlexPen Inject 80 Units into the skin 2 (two) times daily.   lisinopril (ZESTRIL) 2.5 MG tablet Take 1 tablet (2.5 mg total) by mouth daily.   loperamide (IMODIUM) 2 MG capsule Take 1 capsule (2 mg total) by mouth every 6 (six) hours as needed for diarrhea or loose stools.  metoprolol tartrate (LOPRESSOR) 50 MG tablet Take 1 tablet (50 mg total) by mouth 2 (two) times daily.   Multiple Vitamins-Calcium (ONE-A-DAY WOMENS FORMULA) TABS Take 1 tablet by mouth daily.   mupirocin ointment (BACTROBAN) 2 % Apply topically daily.   nitroGLYCERIN (NITROSTAT) 0.4 MG SL tablet Take one tablet every 5 minutes as needed for chest pains up to 3 tablets.   oxyCODONE (OXY IR/ROXICODONE) 5 MG immediate release tablet Take 1 tablet (5 mg total) by mouth every 6 (six) hours as needed for severe pain.   pantoprazole (PROTONIX) 40 MG tablet Take 1 tablet (40 mg total) by mouth daily.   senna-docusate (SENOKOT-S) 8.6-50 MG tablet Take 1 tablet by mouth at bedtime as needed for mild constipation.   simvastatin (ZOCOR) 80 MG tablet Take 1 tablet (80 mg total) by mouth daily.   TRULICITY 1.5 MG/0.5ML SOPN Inject 1.5 mg into the skin once a week.     Allergies:   Aspirin, Bee pollen, Bee venom, Food, Pineapple, E-mycin  [erythromycin base], and Other   Past Medical History:  Diagnosis Date   CAP (community acquired pneumonia)    Streptococcus 01/2011   COPD (chronic obstructive pulmonary disease) (HCC)    Coronary atherosclerosis of native coronary artery    Diagnosed [place] 2006 - DES RCA, reports followup cath [facility] at [facility]    Dyslipidemia    Essential hypertension, benign    Morbid obesity (HCC)    Pancreatitis    Type 2 diabetes mellitus (HCC)     Past Surgical History:  Procedure Laterality Date   ABDOMINAL HERNIA REPAIR     ABDOMINAL HYSTERECTOMY     AMPUTATION Left 01/25/2020   Procedure: LEFT BELOW KNEE AMPUTATION;  Surgeon: [name], MD;  Location: [facility] OR;  Service: Orthopedics;  Laterality: Left;   CORONARY ANGIOPLASTY WITH STENT PLACEMENT  2006   KNEE SURGERY     NOSE SURGERY     Pilonidal cystectomy     TONSILLECTOMY       Social History:  The patient  reports that she quit smoking about 8 months ago. Her smoking use included cigarettes. She has a 10.00 pack-year smoking history. She has never used smokeless tobacco. She reports that she does not drink alcohol and does not use drugs.   Family History:  The patient's family history includes Asthma in her son; Diabetes in her mother; Heart failure in her mother; Hypertension in her mother; Kidney failure in her mother; Ovarian cancer in her mother.    ROS:  Please see the history of present illness. All other systems are reviewed and  Negative to the above problem except as noted.    PHYSICAL EXAM: VS:  BP 140/76   Pulse 91   Ht 5\' 4"  (1.626 m)   Wt (!) 312 lb (141.5 kg)   SpO2 97%   BMI 53.55 kg/m   GEN: Morbidly obese 55 yo  in no acute distress  HEENT: normal  Neck: no JVD, carotid bruits, Cardiac: RRR; no murmurs,  Tr to 1+ LE edema  Respiratory:  clear to auscultation bilaterally   GI: soft, nontender, nondistended, + BS  No hepatomegaly  MS: no deformity Moving all extremities   Skin: warm and dry, no  rash Neuro:  Strength and sensation are intact Psych: euthymic mood, full affect   EKG:  EKG is not ordered today.   Lipid Panel    Component Value Date/Time   CHOL 117 12/25/2019 1359   TRIG 208 (H) 12/25/2019  1359   HDL 25 (L) 12/25/2019 1359   CHOLHDL 4.7 12/25/2019 1359   VLDL 51 (H) 04/11/2012 0253   LDLCALC 65 12/25/2019 1359      Wt Readings from Last 3 Encounters:  10/03/20 (!) 312 lb (141.5 kg)  07/30/20 (!) 309 lb (140.2 kg)  02/20/20 290 lb (131.5 kg)      ASSESSMENT AND PLAN:  1  Chest pain   Pt has hx of CAD  Chronic pains    interimttent     She has had interventions in [place]   Need to clarify what latest anatomy is     Will get records from there  Consider repeat to redefine.   With body habitus may be best anyway ans nuclear stress test will have limtiations       Get records too from Dr Balinda Quails office NTG Rx given    2   CAD   As above  3   Arrhythmia  PT had been on eliquis   Again, need to review records from [place]    She is off it now   I do nto think contrib to pain     4  HL  She is on simvisatin 8o    LDL was 65 in Nov 2021  HDL 25  Trig 208   Discussed diet (carbs, sweets )     5  HTN  BP is fair  Follow  6 DM  Discussed diet  Lmit carbs  A1C was 9.1    7   Morbid obesity   Discussed diet sum  Mediterranian diet        Current medicines are reviewed at length with the patient today.  The patient does not have concerns regarding medicines.  Signed, [name], MD  10/04/2020 2:15 PM    [facility] [address] [place], [place], [place]  [address] Phone: [phone]; Fax: [phone]

## 2020-10-03 ENCOUNTER — Ambulatory Visit (HOSPITAL_COMMUNITY): Payer: Medicare HMO | Admitting: Physical Therapy

## 2020-10-03 ENCOUNTER — Ambulatory Visit (INDEPENDENT_AMBULATORY_CARE_PROVIDER_SITE_OTHER): Payer: Medicare HMO | Admitting: Internal Medicine

## 2020-10-03 ENCOUNTER — Other Ambulatory Visit: Payer: Self-pay

## 2020-10-03 ENCOUNTER — Encounter: Payer: Self-pay | Admitting: Internal Medicine

## 2020-10-03 VITALS — BP 140/76 | HR 91 | Ht 64.0 in | Wt 312.0 lb

## 2020-10-03 DIAGNOSIS — Z89512 Acquired absence of left leg below knee: Secondary | ICD-10-CM | POA: Diagnosis not present

## 2020-10-03 DIAGNOSIS — R262 Difficulty in walking, not elsewhere classified: Secondary | ICD-10-CM

## 2020-10-03 DIAGNOSIS — I2510 Atherosclerotic heart disease of native coronary artery without angina pectoris: Secondary | ICD-10-CM | POA: Diagnosis not present

## 2020-10-03 MED ORDER — NITROGLYCERIN 0.4 MG SL SUBL: SUBLINGUAL_TABLET | SUBLINGUAL | 3 refills | Status: DC

## 2020-10-03 NOTE — Patient Instructions (Addendum)
Medication Instructions:  Your physician has recommended you make the following change in your medication:  START Sublingual Nitroglycerin 0.4 mg tablets as needed for chest pains.     *If you need a refill on your cardiac medications before your next appointment, please call your pharmacy*   Lab Work: None If you have labs (blood work) drawn today and your tests are completely normal, you will receive your results only by: MyChart Message (if you have MyChart) OR A paper copy in the mail If you have any lab test that is abnormal or we need to change your treatment, we will call you to review the results.   Testing/Procedures: None   Follow-Up: At [facility], you and your health needs are our priority.  As part of our continuing mission to provide you with exceptional heart care, we have created designated Provider Care Teams.  These Care Teams include your primary Cardiologist (physician) and Advanced Practice Providers (APPs -  Physician Assistants and Nurse Practitioners) who all work together to provide you with the care you need, when you need it.  We recommend signing up for the patient portal called "MyChart".  Sign up information is provided on this After Visit Summary.  MyChart is used to connect with patients for Virtual Visits (Telemedicine).  Patients are able to view lab/test results, encounter notes, upcoming appointments, etc.  Non-urgent messages can be sent to your provider as well.   To learn more about what you can do with MyChart, go to [email or web address].    Your next appointment:   Follow Up: Pending    Other Instructions Please sign (2) TWO MEDICAL RELEASE FORMS for release of information.

## 2020-10-03 NOTE — Therapy (Signed)
[facility] [address] [place], [place], [address] Phone: [phone]   Fax:  [phone]  Physical Therapy Treatment  Patient Details  Name: Nicole [name] MRN: [id number] Date of Birth: Dec 14, 1965 Referring Provider (PT): [place] Persons   Encounter Date: 10/03/2020   PT End of Session - 10/03/20 1105     Visit Number 3    Number of Visits 18    Date for PT Re-Evaluation 11/13/20    Authorization Type [employer]    Progress Note Due on Visit 10    PT Start Time 1018    PT Stop Time 1100    PT Time Calculation (min) 42 min             Past Medical History:  Diagnosis Date   CAP (community acquired pneumonia)    Streptococcus 01/2011   COPD (chronic obstructive pulmonary disease) (HCC)    Coronary atherosclerosis of native coronary artery    Diagnosed [place] 2006 - DES RCA, reports followup cath 2009 at [facility]    Dyslipidemia    Essential hypertension, benign    Morbid obesity (HCC)    Pancreatitis    Type 2 diabetes mellitus (HCC)     Past Surgical History:  Procedure Laterality Date   ABDOMINAL HERNIA REPAIR     ABDOMINAL HYSTERECTOMY     AMPUTATION Left 01/25/2020   Procedure: LEFT BELOW KNEE AMPUTATION;  Surgeon: [name], MD;  Location: [facility] OR;  Service: Orthopedics;  Laterality: Left;   CORONARY ANGIOPLASTY WITH STENT PLACEMENT  2006   KNEE SURGERY     NOSE SURGERY     Pilonidal cystectomy     TONSILLECTOMY      There were no vitals filed for this visit.   Subjective Assessment - 10/03/20 1035     Subjective Pt just left her cardiologist and everything checked out fine.  States she has been walking around her house more and is now able to donn/doff her prosthesis independently.    Currently in Pain? No/denies                               [facility] Adult PT Treatment/Exercise - 10/03/20 0001       Ambulation/Gait   Gait Comments 175 in 2 minutes      Knee/Hip Exercises:  Standing   Hip Abduction Both;10 reps    Hip Extension Both;10 reps    Lateral Step Up Left;10 reps;Step Height: 4";Hand Hold: 2    Forward Step Up Left;10 reps;Step Height: 4";Hand Hold: 2    Other Standing Knee Exercises tandem in // bars max of 5" with either LE lead in 30" without UE      Knee/Hip Exercises: Seated   Sit to Sand 10 reps;without UE support                      PT Short Term Goals - 09/25/20 1134       PT SHORT TERM GOAL #1   Title Pt to be able to don and doff prothesis I    Time 3    Period Weeks    Status On-going    Target Date 10/09/20      PT SHORT TERM GOAL #2   Title Pt to be able to ambulate for five minutes to be able to get out of the house on her own if needed.  Time 3    Period Weeks    Status On-going      PT SHORT TERM GOAL #3   Title Pt to be able to tolerate having her prothesis on for four hours a day.    Time 3    Period Weeks    Status Achieved               PT Long Term Goals - 09/25/20 1136       PT LONG TERM GOAL #1   Title PT to be able to walk with least assistive device and prothesis for 15 minutes.    Time 9    Period Weeks    Status On-going      PT LONG TERM GOAL #2   Title Pt balance to be improved to be able to go up and down 6 steps without sidestepping to get in and out of her home.    Time 9    Period Weeks    Status On-going                   Plan - 10/03/20 1105     Clinical Impression Statement Continued focus on improving LE strength and functional independence.  Progressed standing activities including hip strengthening and step up activity.  Minimal cues required for remaining upright with LE movements.  Pt did c/o some residual limb discomfort with lateral step up but none with forward step up.  Instructed to do a thorough inspection of LE when she removes prosthesis at home.  Pt able to stand from standard chair without UE assist, establish and maintain balance with  controlled descent.  Pt unable to maintain tandem stance greater than 5" with either LE lead.  Pt required short rest breaks following each activity due to fatigue/SOB.  Overall improving    Personal Factors and Comorbidities Comorbidity 3+;Fitness;Past/Current Experience;Time since onset of injury/illness/exacerbation    Comorbidities DM, CHF, HTN, AFIB, COPD    Examination-Activity Limitations Dressing;Geophysical data processor for Others;Carry;Stand;Stairs;Squat;Toileting;Transfers    Examination-Participation Restrictions Church;Cleaning;Community Activity;Driving;Laundry    Stability/Clinical Decision Making Stable/Uncomplicated    Rehab Potential Good    PT Frequency 2x / week    PT Duration --   9 weeks   PT Treatment/Interventions ADLs/Self Care Home Management;Manual techniques;Other (comment);Patient/family education;Therapeutic activities;Therapeutic exercise;Functional mobility training;Stair training;Gait training    PT Next Visit Plan Gt training, standing balance activity, stair training.  Next session work on side stepping and gait with 1 UE in parallel bars. Continue to improve activity tolerance.    PT Home Exercise Plan increase wearing time of prothesis, walk everyday             Patient will benefit from skilled therapeutic intervention in order to improve the following deficits and impairments:  Cardiopulmonary status limiting activity, Decreased activity tolerance, Decreased balance, Difficulty walking, Decreased mobility, Pain, Obesity, Impaired perceived functional ability  Visit Diagnosis: Left below-knee amputee (HCC)  Difficulty in walking, not elsewhere classified     Problem List Patient Active Problem List   Diagnosis Date Noted   Hx of BKA, left (HCC) 09/09/2020   Vitamin D deficiency 07/30/2020   Chronic respiratory failure with hypoxia (HCC) 07/30/2020   Depression 07/30/2020   Atrial fibrillation (HCC) 07/30/2020   Acute osteomyelitis of toe, left  (HCC)    Sepsis due to Lt foot osteo 01/23/2020   Morbid obesity with body mass index (BMI) of 50.0 to 59.9 in adult [facility]) 01/23/2020   Tobacco abuse--2 P/day Smoker  01/23/2020   Cellulitis of left foot 01/22/2020   Osteomyelitis of left foot (HCC) 01/22/2020   Leukocytosis 01/22/2020   Hypokalemia 01/22/2020   Hypoalbuminemia 01/22/2020   Hyperglycemia due to diabetes mellitus (HCC) 01/22/2020   Elevated alpha fetoprotein 01/22/2020   SIRS (systemic inflammatory response syndrome) (HCC) 01/22/2020   Diabetic ulcer of left foot (HCC) 12/05/2019   Cough 05/26/2012   TIA (transient ischemic attack) 04/10/2012   Acute diastolic congestive heart failure (HCC) 11/12/2011   COPD (chronic obstructive pulmonary disease) (HCC) 11/09/2011   Dyspnea 06/10/2011   CAP (community acquired pneumonia) 02/19/2011   Coronary atherosclerosis of native coronary artery/Stent in 2006 02/19/2011   Dyslipidemia    Morbid obesity (HCC)    Diabetes mellitus type 2 in obese (HCC)    HTN (hypertension), benign    Lurena Nida, PTA/CLT [phone]  Lurena Nida 10/03/2020, 11:07 AM  [facility] [address] [place], [place], [address] Phone: [phone]   Fax:  [phone]  Name: Nicole [name] MRN: [id number] Date of Birth: [birth year]

## 2020-10-07 ENCOUNTER — Telehealth (HOSPITAL_COMMUNITY): Payer: Self-pay | Admitting: Physical Therapy

## 2020-10-07 ENCOUNTER — Ambulatory Visit (HOSPITAL_COMMUNITY): Payer: Medicare HMO | Admitting: Physical Therapy

## 2020-10-07 NOTE — Telephone Encounter (Signed)
2nd no show.  Left message that her next appointment will be at 11:30 on 8/18 and per our no show policy all other visits will be cancelled at this time and the pt will need to schedule one appointment at a time. Virgina Organ, PT CLT [phone]

## 2020-10-09 ENCOUNTER — Telehealth (HOSPITAL_COMMUNITY): Payer: Self-pay

## 2020-10-09 ENCOUNTER — Ambulatory Visit (HOSPITAL_COMMUNITY): Payer: Medicare HMO

## 2020-10-09 NOTE — Telephone Encounter (Signed)
No show #3.  Called and left message concerning missed apt today.  Reviewed no show policy and she will be discharged.  If wishes to return to therapy, encouraged to contact MD for new referral.  Becky Sax, LPTA/CLT; [place] [phone]

## 2020-10-10 ENCOUNTER — Encounter (HOSPITAL_COMMUNITY): Payer: Self-pay | Admitting: Physical Therapy

## 2020-10-10 NOTE — Therapy (Signed)
[place], [place], [address] Phone: [phone]   Fax:  [phone]  Patient Details  Name: Nicole [name] MRN: [id number] Date of Birth: 03-29-1965 Referring Provider:  No ref. provider found  Encounter Date: 10/10/2020 PHYSICAL THERAPY DISCHARGE SUMMARY  Visits from Start of Care: 3  Current functional level related to goals / functional outcomes: Unknown    Remaining deficits: unknown   Education / Equipment: HEP   Patient agrees to discharge. Patient goals were not met. Patient is being discharged due to not returning since the last visit.  Rayetta Humphrey, PT CLT [phone]  10/10/2020, 3:46 PM  [place] [address] [place], [place], [address] Phone: [phone]   Fax:  [phone]

## 2020-10-14 ENCOUNTER — Encounter (HOSPITAL_COMMUNITY): Payer: Medicare HMO | Admitting: Physical Therapy

## 2020-10-16 ENCOUNTER — Encounter (HOSPITAL_COMMUNITY): Payer: Medicare HMO | Admitting: Physical Therapy

## 2020-10-20 ENCOUNTER — Encounter (HOSPITAL_COMMUNITY): Payer: Medicare HMO | Admitting: Physical Therapy

## 2020-10-20 ENCOUNTER — Ambulatory Visit (INDEPENDENT_AMBULATORY_CARE_PROVIDER_SITE_OTHER): Payer: Medicare HMO | Admitting: Gastroenterology

## 2020-10-21 ENCOUNTER — Encounter (INDEPENDENT_AMBULATORY_CARE_PROVIDER_SITE_OTHER): Payer: Self-pay | Admitting: Gastroenterology

## 2020-10-22 ENCOUNTER — Ambulatory Visit (INDEPENDENT_AMBULATORY_CARE_PROVIDER_SITE_OTHER): Payer: Medicare HMO | Admitting: Gastroenterology

## 2020-10-22 ENCOUNTER — Encounter (HOSPITAL_COMMUNITY): Payer: Medicare HMO | Admitting: Physical Therapy

## 2020-10-28 ENCOUNTER — Encounter (HOSPITAL_COMMUNITY): Payer: Medicare HMO | Admitting: Physical Therapy

## 2020-10-30 ENCOUNTER — Telehealth: Payer: Self-pay | Admitting: Internal Medicine

## 2020-10-30 ENCOUNTER — Encounter (HOSPITAL_COMMUNITY): Payer: Medicare HMO | Admitting: Physical Therapy

## 2020-10-30 NOTE — Telephone Encounter (Signed)
Pt is returning call to [facility] concerning records

## 2020-10-30 NOTE — Telephone Encounter (Signed)
Noted Regarding records from [place]

## 2020-10-31 ENCOUNTER — Other Ambulatory Visit: Payer: Self-pay

## 2020-10-31 ENCOUNTER — Encounter (HOSPITAL_COMMUNITY): Payer: Self-pay | Admitting: Emergency Medicine

## 2020-10-31 ENCOUNTER — Emergency Department (HOSPITAL_COMMUNITY): Payer: Medicare HMO

## 2020-10-31 ENCOUNTER — Emergency Department (HOSPITAL_COMMUNITY)
Admission: EM | Admit: 2020-10-31 | Discharge: 2020-10-31 | Disposition: A | Discharge status: Home / Self Care | Payer: Medicare HMO | Attending: Emergency Medicine | Admitting: Emergency Medicine

## 2020-10-31 DIAGNOSIS — Z79899 Other long term (current) drug therapy: Secondary | ICD-10-CM | POA: Diagnosis not present

## 2020-10-31 DIAGNOSIS — L03115 Cellulitis of right lower limb: Secondary | ICD-10-CM | POA: Insufficient documentation

## 2020-10-31 DIAGNOSIS — E1159 Type 2 diabetes mellitus with other circulatory complications: Secondary | ICD-10-CM | POA: Insufficient documentation

## 2020-10-31 DIAGNOSIS — Z794 Long term (current) use of insulin: Secondary | ICD-10-CM | POA: Diagnosis not present

## 2020-10-31 DIAGNOSIS — R2241 Localized swelling, mass and lump, right lower limb: Secondary | ICD-10-CM | POA: Diagnosis not present

## 2020-10-31 DIAGNOSIS — I2510 Atherosclerotic heart disease of native coronary artery without angina pectoris: Secondary | ICD-10-CM | POA: Insufficient documentation

## 2020-10-31 DIAGNOSIS — M7989 Other specified soft tissue disorders: Secondary | ICD-10-CM | POA: Diagnosis not present

## 2020-10-31 DIAGNOSIS — J449 Chronic obstructive pulmonary disease, unspecified: Secondary | ICD-10-CM | POA: Insufficient documentation

## 2020-10-31 DIAGNOSIS — Z87891 Personal history of nicotine dependence: Secondary | ICD-10-CM | POA: Insufficient documentation

## 2020-10-31 DIAGNOSIS — Z7984 Long term (current) use of oral hypoglycemic drugs: Secondary | ICD-10-CM | POA: Insufficient documentation

## 2020-10-31 DIAGNOSIS — L539 Erythematous condition, unspecified: Secondary | ICD-10-CM | POA: Diagnosis present

## 2020-10-31 LAB — CBC WITH DIFFERENTIAL/PLATELET
Abs Immature Granulocytes: 0.09 10*3/uL — ABNORMAL HIGH (ref 0.00–0.07)
Basophils Absolute: 0.1 10*3/uL (ref 0.0–0.1)
Basophils Relative: 1 %
Eosinophils Absolute: 0.3 10*3/uL (ref 0.0–0.5)
Eosinophils Relative: 2 %
HCT: 42.4 % (ref 36.0–46.0)
Hemoglobin: 13.6 g/dL (ref 12.0–15.0)
Immature Granulocytes: 1 %
Lymphocytes Relative: 22 %
Lymphs Abs: 2.6 10*3/uL (ref 0.7–4.0)
MCH: 30.4 pg (ref 26.0–34.0)
MCHC: 32.1 g/dL (ref 30.0–36.0)
MCV: 94.9 fL (ref 80.0–100.0)
Monocytes Absolute: 0.8 10*3/uL (ref 0.1–1.0)
Monocytes Relative: 7 %
Neutro Abs: 7.7 10*3/uL (ref 1.7–7.7)
Neutrophils Relative %: 67 %
Platelets: 222 10*3/uL (ref 150–400)
RBC: 4.47 MIL/uL (ref 3.87–5.11)
RDW: 13.9 % (ref 11.5–15.5)
WBC: 11.6 10*3/uL — ABNORMAL HIGH (ref 4.0–10.5)
nRBC: 0 % (ref 0.0–0.2)

## 2020-10-31 LAB — COMPREHENSIVE METABOLIC PANEL
ALT: 20 U/L (ref 0–44)
AST: 31 U/L (ref 15–41)
Albumin: 3.5 g/dL (ref 3.5–5.0)
Alkaline Phosphatase: 121 U/L (ref 38–126)
Anion gap: 10 (ref 5–15)
BUN: 23 mg/dL — ABNORMAL HIGH (ref 6–20)
CO2: 29 mmol/L (ref 22–32)
Calcium: 9.2 mg/dL (ref 8.9–10.3)
Chloride: 97 mmol/L — ABNORMAL LOW (ref 98–111)
Creatinine, Ser: 0.9 mg/dL (ref 0.44–1.00)
GFR, Estimated: >60 mL/min (ref >60)
Glucose, Bld: 444 mg/dL — ABNORMAL HIGH (ref 70–99)
Potassium: 5 mmol/L (ref 3.5–5.1)
Sodium: 136 mmol/L (ref 135–145)
Total Bilirubin: 0.4 mg/dL (ref 0.3–1.2)
Total Protein: 7.2 g/dL (ref 6.5–8.1)

## 2020-10-31 MED ORDER — CEPHALEXIN 500 MG PO CAPS: 500.0000 mg | ORAL_CAPSULE | Freq: Four times a day (QID) | ORAL | 0 refills | Status: DC

## 2020-10-31 NOTE — ED Triage Notes (Signed)
Pt c/o of wound to right great toe and blister to right shin.

## 2020-10-31 NOTE — Discharge Instructions (Addendum)
Call [facility] for a primary md.    Follow up with Dr. Ferman Hamming next week for your toe and leg

## 2020-10-31 NOTE — ED Provider Notes (Signed)
[facility] EMERGENCY DEPARTMENT Provider Note   CSN: [id number] Arrival date & time: 10/31/20  1607     History Chief Complaint  Patient presents with   Wound Check    Nicole [name] is a 55 y.o. female.  Patient complains of redness to her lower leg with swelling to her big toe on the right which is been a long time  The history is provided by the patient and medical records. No language interpreter was used.  Wound Check This is a new problem. The current episode started more than 2 days ago. The problem occurs constantly. The problem has not changed since onset.Pertinent negatives include no chest pain, no abdominal pain and no headaches. Nothing aggravates the symptoms. Nothing relieves the symptoms.      Past Medical History:  Diagnosis Date   CAP (community acquired pneumonia)    Streptococcus 01/2011   COPD (chronic obstructive pulmonary disease) (HCC)    Coronary atherosclerosis of native coronary artery    Diagnosed [place] 2006 - DES RCA, reports followup cath 2009 at [facility]    Dyslipidemia    Essential hypertension, benign    Morbid obesity (HCC)    Pancreatitis    Type 2 diabetes mellitus (HCC)     Patient Active Problem List   Diagnosis Date Noted   Hx of BKA, left (HCC) 09/09/2020   Vitamin D deficiency 07/30/2020   Chronic respiratory failure with hypoxia (HCC) 07/30/2020   Depression 07/30/2020   Atrial fibrillation (HCC) 07/30/2020   Acute osteomyelitis of toe, left (HCC)    Sepsis due to Lt foot osteo 01/23/2020   Morbid obesity with body mass index (BMI) of 50.0 to 59.9 in adult (HCC) 01/23/2020   Tobacco abuse--2 P/day Smoker 01/23/2020   Cellulitis of left foot 01/22/2020   Osteomyelitis of left foot (HCC) 01/22/2020   Leukocytosis 01/22/2020   Hypokalemia 01/22/2020   Hypoalbuminemia 01/22/2020   Hyperglycemia due to diabetes mellitus (HCC) 01/22/2020   Elevated alpha fetoprotein 01/22/2020   SIRS (systemic inflammatory response  syndrome) (HCC) 01/22/2020   Diabetic ulcer of left foot (HCC) 12/05/2019   Cough 05/26/2012   TIA (transient ischemic attack) 04/10/2012   Acute diastolic congestive heart failure (HCC) 11/12/2011   COPD (chronic obstructive pulmonary disease) (HCC) 11/09/2011   Dyspnea 06/10/2011   CAP (community acquired pneumonia) 02/19/2011   Coronary atherosclerosis of native coronary artery/Stent in 2006 02/19/2011   Dyslipidemia    Morbid obesity (HCC)    Diabetes mellitus type 2 in obese (HCC)    HTN (hypertension), benign     Past Surgical History:  Procedure Laterality Date   ABDOMINAL HERNIA REPAIR     ABDOMINAL HYSTERECTOMY     AMPUTATION Left 01/25/2020   Procedure: LEFT BELOW KNEE AMPUTATION;  Surgeon: [name], MD;  Location: [facility] OR;  Service: Orthopedics;  Laterality: Left;   CORONARY ANGIOPLASTY WITH STENT PLACEMENT  2006   KNEE SURGERY     NOSE SURGERY     Pilonidal cystectomy     TONSILLECTOMY       OB History   No obstetric history on file.     Family History  Problem Relation Age of Onset   Diabetes Mother    Heart failure Mother    Hypertension Mother    Kidney failure Mother    Ovarian cancer Mother    Asthma Son     Social History   Tobacco Use   Smoking status: Former    Packs/day: 0.50  Years: 20.00    Pack years: 10.00    Types: Cigarettes    Quit date: 01/22/2020    Years since quitting: 0.7   Smokeless tobacco: Never  Vaping Use   Vaping Use: Never used  Substance Use Topics   Alcohol use: No   Drug use: No    Home Medications Prior to Admission medications   Medication Sig Start Date End Date Taking? Authorizing Provider  cephALEXin (KEFLEX) 500 MG capsule Take 1 capsule (500 mg total) by mouth 4 (four) times daily. 10/31/20  Yes Bethann BerkshireZammit, Venicia Vandall, MD  acetaminophen (TYLENOL) 500 MG tablet Take 1 tablet (500 mg total) by mouth every 6 (six) hours as needed for moderate pain. 02/05/20   Amin, Loura HaltAnkit Chirag, MD  albuterol (PROVENTIL) (2.5  MG/3ML) 0.083% nebulizer solution Take 3 mLs (2.5 mg total) by nebulization every 6 (six) hours as needed for wheezing. 07/30/20   Elenore PaddyGray, Sarah E, NP  albuterol (VENTOLIN HFA) 108 (90 Base) MCG/ACT inhaler Inhale 2 puffs into the lungs every 6 (six) hours as needed for wheezing. Shortness of breath 07/30/20   Elenore PaddyGray, Sarah E, NP  apixaban (ELIQUIS) 5 MG TABS tablet Take 1 tablet (5 mg total) by mouth 2 (two) times daily. Patient not taking: Reported on 10/03/2020 05/15/20   Elenore PaddyGray, Sarah E, NP  buPROPion [facility](WELLBUTRIN SR) 100 MG 12 hr tablet Take 1 tablet (100 mg total) by mouth 2 (two) times daily. 07/30/20   Elenore PaddyGray, Sarah E, NP  celecoxib (CELEBREX) 200 MG capsule Take 1 capsule (200 mg total) by mouth 2 (two) times daily. 07/30/20   Elenore PaddyGray, Sarah E, NP  Cholecalciferol 25 MCG (1000 UT) capsule Take 1,000 Units by mouth daily.    [provider]  clopidogrel (PLAVIX) 75 MG tablet Take 1 tablet (75 mg total) by mouth daily. 07/30/20   Elenore PaddyGray, Sarah E, NP  docusate sodium (COLACE) 100 MG capsule Take 1 capsule (100 mg total) by mouth 2 (two) times daily as needed for mild constipation. 07/30/20   Elenore PaddyGray, Sarah E, NP  DULoxetine (CYMBALTA) 60 MG capsule Take 1 capsule (60 mg total) by mouth daily. 07/30/20   Elenore PaddyGray, Sarah E, NP  EASY [facility] SYRING[facility] 31G X 5/16" 1 ML MISC  09/21/19   [provider]  EPINEPHrine 0.3 mg/0.3 mL IJ SOAJ injection Inject 0.3 mg into the muscle as needed for anaphylaxis. 05/15/20   Elenore PaddyGray, Sarah E, NP  furosemide (LASIX) 80 MG tablet Take 1 tablet (80 mg total) by mouth daily. 07/30/20   Elenore PaddyGray, Sarah E, NP  glipiZIDE (GLUCOTROL) 10 MG tablet Take 1 tablet (10 mg total) by mouth in the morning and at bedtime. 07/30/20   Elenore PaddyGray, Sarah E, NP  insulin aspart (NOVOLOG FLEXPEN) 100 UNIT/ML FlexPen Inject 60 units into the skin three times a day with meals. 07/30/20   Elenore PaddyGray, Sarah E, NP  insulin detemir (LEVEMIR FLEXTOUCH) 100 UNIT/ML FlexPen Inject 80 Units into the skin 2 (two) times daily. 07/31/20    Elenore PaddyGray, Sarah E, NP  lisinopril (ZESTRIL) 2.5 MG tablet Take 1 tablet (2.5 mg total) by mouth daily. 07/30/20   Elenore PaddyGray, Sarah E, NP  loperamide (IMODIUM) 2 MG capsule Take 1 capsule (2 mg total) by mouth every 6 (six) hours as needed for diarrhea or loose stools. 12/10/19   Erick BlinksMemon, Jehanzeb, MD  metoprolol tartrate (LOPRESSOR) 50 MG tablet Take 1 tablet (50 mg total) by mouth 2 (two) times daily. 07/30/20   Elenore PaddyGray, Sarah E, NP  Multiple Vitamins-Calcium (ONE-A-DAY  WOMENS FORMULA) TABS Take 1 tablet by mouth daily.    [provider]  mupirocin ointment (BACTROBAN) 2 % Apply topically daily. 12/10/19   Erick Blinks, MD  nitroGLYCERIN (NITROSTAT) 0.4 MG SL tablet Take one tablet every 5 minutes as needed for chest pains up to 3 tablets. 10/03/20   Pricilla Riffle, MD  oxyCODONE (OXY IR/ROXICODONE) 5 MG immediate release tablet Take 1 tablet (5 mg total) by mouth every 6 (six) hours as needed for severe pain. 05/15/20   Elenore Paddy, NP  pantoprazole (PROTONIX) 40 MG tablet Take 1 tablet (40 mg total) by mouth daily. 07/30/20   Elenore Paddy, NP  senna-docusate (SENOKOT-S) 8.6-50 MG tablet Take 1 tablet by mouth at bedtime as needed for mild constipation. 02/05/20   Amin, Loura Halt, MD  simvastatin (ZOCOR) 80 MG tablet Take 1 tablet (80 mg total) by mouth daily. 07/30/20   Elenore Paddy, NP  TRULICITY 1.5 MG/0.5ML SOPN Inject 1.5 mg into the skin once a week. 07/30/20   Elenore Paddy, NP    Allergies    Aspirin, Bee pollen, Bee venom, Food, Pineapple, E-mycin [erythromycin base], and Other  Review of Systems   Review of Systems  Constitutional:  Negative for appetite change and fatigue.  HENT:  Negative for congestion, ear discharge and sinus pressure.   Eyes:  Negative for discharge.  Respiratory:  Negative for cough.   Cardiovascular:  Negative for chest pain.  Gastrointestinal:  Negative for abdominal pain and diarrhea.  Genitourinary:  Negative for frequency and hematuria.  Musculoskeletal:   Negative for back pain.       Redness and swelling right lower leg  Skin:  Negative for rash.  Neurological:  Negative for seizures and headaches.  Psychiatric/Behavioral:  Negative for hallucinations.    Physical Exam Updated Vital Signs BP (!) 171/70 (BP Location: Right Arm)   Pulse 88   Temp 98.5 F (36.9 C) (Oral)   Resp 19   Ht 5\' 4"  (1.626 m)   Wt (!) 141.5 kg   SpO2 100%   BMI 53.55 kg/m   Physical Exam Vitals and nursing note reviewed.  Constitutional:      Appearance: She is well-developed.  HENT:     Head: Normocephalic.  Eyes:     General: No scleral icterus.    Conjunctiva/sclera: Conjunctivae normal.  Neck:     Thyroid: No thyromegaly.  Cardiovascular:     Rate and Rhythm: Normal rate and regular rhythm.     Heart sounds: No murmur heard.   No friction rub. No gallop.  Pulmonary:     Breath sounds: No stridor. No wheezing or rales.  Chest:     Chest wall: No tenderness.  Abdominal:     General: There is no distension.     Tenderness: There is no abdominal tenderness. There is no rebound.  Musculoskeletal:        General: Normal range of motion.     Cervical back: Neck supple.  Lymphadenopathy:     Cervical: No cervical adenopathy.  Skin:    Findings: No erythema or rash.     Comments: Small red inflamed area to mid tib-fib.  Also left toe with significant disease involving the toenail and distal toe  Neurological:     Mental Status: She is alert and oriented to person, place, and time.     Motor: No abnormal muscle tone.     Coordination: Coordination normal.  Psychiatric:  Behavior: Behavior normal.    ED Results / Procedures / Treatments   Labs (all labs ordered are listed, but only abnormal results are displayed) Labs Reviewed  CBC WITH DIFFERENTIAL/PLATELET - Abnormal; Notable for the following components:      Result Value   WBC 11.6 (*)    Abs Immature Granulocytes 0.09 (*)    All other components within normal limits   COMPREHENSIVE METABOLIC PANEL - Abnormal; Notable for the following components:   Chloride 97 (*)    Glucose, Bld 444 (*)    BUN 23 (*)    All other components within normal limits    EKG None  Radiology DG Tibia/Fibula Right  Result Date: 10/31/2020 CLINICAL DATA:  Blister to right lower leg for 2 days, pain EXAM: RIGHT TIBIA AND FIBULA - 2 VIEW COMPARISON:  Knee x-ray 05/28/2020 FINDINGS: No evidence of acute fracture or dislocation. Smooth cortical thickening along the lateral cortex of the distal fibular metadiaphysis. No periosteal elevation. Mild soft tissue swelling. No soft tissue gas. IMPRESSION: 1. Smooth cortical thickening along the lateral cortex of the distal fibular metadiaphysis, which could be stress-related or reactive secondary to chronic infection or inflammation. 2. No radiographic evidence of acute osteomyelitis. If high clinical suspicion persists, consider MRI. 3. Mild soft tissue swelling. Electronically Signed   By: Duanne Guess D.O.   On: 10/31/2020 11:24   DG Foot Complete Right  Result Date: 10/31/2020 CLINICAL DATA:  Right great toe wound EXAM: RIGHT FOOT COMPLETE - 3+ VIEW COMPARISON:  08/14/2010 FINDINGS: No acute fracture. No dislocation. No bony erosion or periosteal elevation. Soft tissue swelling of the great toe. No soft tissue gas. IMPRESSION: No radiographic evidence of osteomyelitis of the right foot. Electronically Signed   By: Duanne Guess D.O.   On: 10/31/2020 11:30    Procedures Procedures   Medications Ordered in ED Medications - No data to display  ED Course  I have reviewed the triage vital signs and the nursing notes.  Pertinent labs & imaging results that were available during my care of the patient were reviewed by me and considered in my medical decision making (see chart for details).    MDM Rules/Calculators/A&P                           Patient with mild cellulitis to right lower leg.  And deformed nail to right large  toe.  Patient started on Keflex and referred to podiatry and a family doctor Final Clinical Impression(s) / ED Diagnoses Final diagnoses:  Cellulitis of right lower extremity    Rx / DC Orders ED Discharge Orders          Ordered    cephALEXin (KEFLEX) 500 MG capsule  4 times daily        10/31/20 1333             Bethann Berkshire, MD 11/01/20 1227

## 2020-11-04 ENCOUNTER — Encounter (HOSPITAL_COMMUNITY): Payer: Medicare HMO | Admitting: Physical Therapy

## 2020-11-06 ENCOUNTER — Encounter (HOSPITAL_COMMUNITY): Payer: Medicare HMO | Admitting: Physical Therapy

## 2020-11-06 DIAGNOSIS — A419 Sepsis, unspecified organism: Secondary | ICD-10-CM | POA: Diagnosis not present

## 2020-11-06 DIAGNOSIS — M86172 Other acute osteomyelitis, left ankle and foot: Secondary | ICD-10-CM | POA: Diagnosis not present

## 2020-11-06 DIAGNOSIS — R531 Weakness: Secondary | ICD-10-CM | POA: Diagnosis not present

## 2020-11-10 ENCOUNTER — Ambulatory Visit: Payer: Medicare HMO | Admitting: Podiatry

## 2020-11-11 ENCOUNTER — Encounter (HOSPITAL_COMMUNITY): Payer: Medicare HMO | Admitting: Physical Therapy

## 2020-11-13 ENCOUNTER — Encounter (HOSPITAL_COMMUNITY): Payer: Medicare HMO | Admitting: Physical Therapy

## 2020-11-18 ENCOUNTER — Encounter (HOSPITAL_COMMUNITY): Payer: Medicare HMO

## 2020-11-20 ENCOUNTER — Encounter (HOSPITAL_COMMUNITY): Payer: Medicare HMO | Admitting: Physical Therapy

## 2020-11-25 NOTE — Therapy (Unsigned)
[facility] [address] [place], [place], [address] Phone: [phone]   Fax:  [phone]  Patient Details  Name: AISLING EMIGH MRN: [id number] Date of Birth: [birth year] Referring Provider:  Erick Blinks, MD  Encounter Date: 12/13/2019 Error.  Sanam Marmo,CINDY 11/25/2020, 8:29 AM  [facility] [address] [place], [place], [address] Phone: [phone]   Fax:  [phone]

## 2020-12-06 DIAGNOSIS — M86172 Other acute osteomyelitis, left ankle and foot: Secondary | ICD-10-CM | POA: Diagnosis not present

## 2020-12-06 DIAGNOSIS — A419 Sepsis, unspecified organism: Secondary | ICD-10-CM | POA: Diagnosis not present

## 2020-12-06 DIAGNOSIS — R531 Weakness: Secondary | ICD-10-CM | POA: Diagnosis not present

## 2020-12-08 ENCOUNTER — Ambulatory Visit: Payer: Medicare HMO | Admitting: Podiatry

## 2020-12-19 NOTE — Progress Notes (Signed)
Subjective:    Nicole [name] - 55 y.o. female MRN [id number]  Date of birth: 10-31-1965  HPI  Nicole [name] is to establish care.   Current issues and/or concerns: CELLULITIS RIGHT LOWER EXTREMITY FOLLOW-UP: Visit 10/31/2020 at [facility] Emergency Department per MD note: Patient with mild cellulitis to right lower leg.  And deformed nail to right large toe.  Patient started on Keflex and referred to podiatry and a family doctor  12/23/2020: Reports appointment with foot doctor tomorrow.   2. HISTORY OF LEFT BKA: Reports around November 2021 while living in [place]. No longer followed by Orthopedics. No issues/concerns. Requesting refills of Celebrex.  3. COPD: 4. OXYGEN: Reports on 3 liters oxygen at home always for COPD. Reports has an oxygen concentrator at home. Needs portable oxygen so that she can wear oxygen outside of home setting. Reports contacted [employer] and has approval for portable oxygen. However, needs order today from provider so that can get equipment sent to her home.  5. DIABETES TYPE 2: Med Adherence:  []  Yes    [x]  No, reports not taking Novolog insulin for at least 1 week. Reports does not want to take Novolog so she doesn't. Still taking Glipizide, Levemir, and Trulicity. Reports Metformin caused kidney failure in mother which is why she was never prescribed the same.   Medication side effects:  []  Yes    [x]  No Home Monitoring?  [x]  Yes    []  No Home glucose results range: 300's-500's. Reports when she takes Novolog she notices blood sugars for to 160's. Diet Adherence: []  Yes    [x]  No Exercise: []  Yes    [x]  No  6. ANXIETY DEPRESSION: Reports related to everything. Worried about her health conditions, her daughter, and grandchildren. Reports has to wait on everyone to do the things that she would have done independently in the past. Reports no current thoughts of suicide, homicide, or self-harm today in office but has considered in the past. Whenever having  those thoughts goes into her bedroom for alone time and then feels better. Has been to counseling and doesn't feel it helps. Daughter and son-in-law live in the home and are emotionally supportive.   7. HYPERTENSION: 8. HEART FAILURE: 9. ATRIAL FIBRILLATION: 10. TRANSIENT ISCHEMIC ATTACK: 11. CORONARY ATHEROSCLEROSIS: Followed by Cardiology.    Depression screen [facility] 2/9 12/23/2020 07/30/2020  Decreased Interest 2 1  Down, Depressed, Hopeless 3 2  PHQ - 2 Score 5 3  Altered sleeping 1 2  Tired, decreased energy 3 2  Change in appetite 1 3  Feeling bad or failure about yourself  3 1  Trouble concentrating - 1  Moving slowly or fidgety/restless 1 0  Suicidal thoughts 1 0  PHQ-9 Score 15 12  Difficult doing work/chores - Somewhat difficult     ROS per HPI     Health Maintenance: Health Maintenance Due  Topic Date Due   FOOT EXAM  Never done   OPHTHALMOLOGY EXAM  Never done   Hepatitis C Screening  Never done   TETANUS/TDAP  Never done   PAP SMEAR-Modifier  Never done   COLONOSCOPY (Pts 45-51yrs Insurance coverage will need to be confirmed)  Never done   MAMMOGRAM  02/25/2014   Zoster Vaccines- Shingrix (1 of 2) Never done   COVID-19 Vaccine (3 - Booster for Moderna series) 09/19/2019   Pneumococcal Vaccine 80-69 Years old (2 - PCV) 12/24/2020     Past Medical History: Patient Active Problem List   Diagnosis Date  Noted   Hx of BKA, left (HCC) 09/09/2020   Vitamin D deficiency 07/30/2020   Chronic respiratory failure with hypoxia (HCC) 07/30/2020   Anxiety and depression 07/30/2020   Atrial fibrillation (HCC) 07/30/2020   Acute osteomyelitis of toe, left (HCC)    Sepsis due to Lt foot osteo 01/23/2020   Morbid obesity with body mass index (BMI) of 50.0 to 59.9 in adult (HCC) 01/23/2020   Tobacco abuse--2 P/day Smoker 01/23/2020   Cellulitis of left foot 01/22/2020   Osteomyelitis of left foot (HCC) 01/22/2020   Leukocytosis 01/22/2020   Hypokalemia 01/22/2020    Hypoalbuminemia 01/22/2020   Hyperglycemia due to diabetes mellitus (HCC) 01/22/2020   Elevated alpha fetoprotein 01/22/2020   SIRS (systemic inflammatory response syndrome) (HCC) 01/22/2020   Diabetic ulcer of left foot (HCC) 12/05/2019   Cough 05/26/2012   TIA (transient ischemic attack) 04/10/2012   Acute diastolic congestive heart failure (HCC) 11/12/2011   COPD (chronic obstructive pulmonary disease) (HCC) 11/09/2011   Dyspnea 06/10/2011   CAP (community acquired pneumonia) 02/19/2011   Coronary atherosclerosis of native coronary artery/Stent in 2006 02/19/2011   Dyslipidemia    Morbid obesity (HCC)    Diabetes mellitus type 2 in obese (HCC)    HTN (hypertension), benign     Social History   reports that she quit smoking about 11 months ago. Her smoking use included cigarettes. She has a 10.00 pack-year smoking history. She has never used smokeless tobacco. She reports that she does not drink alcohol and does not use drugs.   Family History  family history includes Asthma in her son; Diabetes in her mother; Heart failure in her mother; Hypertension in her mother; Kidney failure in her mother; Ovarian cancer in her mother.   Medications: reviewed and updated   Objective:   Physical Exam BP (!) 158/81   Pulse 87   Resp 17   SpO2 94%   Physical Exam HENT:     Head: Normocephalic and atraumatic.  Eyes:     Extraocular Movements: Extraocular movements intact.     Conjunctiva/sclera: Conjunctivae normal.     Pupils: Pupils are equal, round, and reactive to light.  Cardiovascular:     Rate and Rhythm: Normal rate and regular rhythm.     Pulses: Normal pulses.     Heart sounds: Normal heart sounds.  Pulmonary:     Effort: Pulmonary effort is normal.     Breath sounds: Normal breath sounds.  Musculoskeletal:     Cervical back: Normal range of motion and neck supple.  Neurological:     General: No focal deficit present.     Mental Status: She is alert and oriented to  person, place, and time.  Psychiatric:        Mood and Affect: Mood normal.        Behavior: Behavior normal.   Results for orders placed or performed in visit on 12/23/20  POCT glycosylated hemoglobin (Hb A1C)  Result Value Ref Range   Hemoglobin A1C     HbA1c POC (<> result, manual entry)     HbA1c, POC (prediabetic range)     HbA1c, POC (controlled diabetic range) 12.4 (A) 0.0 - 7.0 %      Assessment & Plan:  1. Encounter to establish care: - Patient presents today to establish care.  - Return for annual physical examination, labs, and health maintenance. Arrive fasting meaning having no food for at least 8 hours prior to appointment. You may have only water or black  coffee. Please take scheduled medications as normal.  2. Hospital discharge follow-up: - Reviewed hospital course, current medications, ensured proper follow-up in place, and addressed concerns.   3. Cellulitis of right lower extremity: - Keep all appointments scheduled with Podiatry.  4. History of left below knee amputation [facility]): - Patient reports surgery around November 2021 while living in [place]. No longer followed by Orthopedics. No issues/concerns.  - Continue Celecoxib as prescribed.  - Follow-up with primary provider as scheduled.  - celecoxib (CELEBREX) 200 MG capsule; Take 1 capsule (200 mg total) by mouth 2 (two) times daily.  Dispense: 180 capsule; Refill: 0  5. Chronic obstructive pulmonary disease, unspecified COPD type (HCC): 6. Oxygen dependent: - Patient currently on 3 liters of oxygen in home setting. Needs portable oxygen for outside of home. New order for portable oxygen and patient already established with [employer] to receive supply. - Referral to Pulmonology for further evaluation and management.  - Follow-up with primary provider as scheduled.  - Ambulatory referral to Pulmonology - PR OXYGEN SYSTEM GAS PORTABLE  7. Diabetes mellitus type 2 in obese [facility]): - Hemoglobin A1c today not at  goal at 12.4%, goal < 7%. This is increased from previous of 9.1% on 05/16/2020. - Patient endorses dietary indiscretion.  - Patient reports she does not take Novolog insulin as she prefers not to do so.  - Continue Glipizide, Trulicity, and Levemir insulin as prescribed.  - Encouraged to resume Novolog insulin as prescribed.  - Referral to Endocrinology for further evaluation and management.  - POCT glycosylated hemoglobin (Hb A1C) - glipiZIDE (GLUCOTROL) 10 MG tablet; Take 1 tablet (10 mg total) by mouth in the morning and at bedtime.  Dispense: 180 tablet; Refill: 0 - insulin aspart (NOVOLOG FLEXPEN) 100 UNIT/ML FlexPen; Inject 60 units into the skin three times a day with meals.  Dispense: 15 mL; Refill: 2 - Insulin Pen Needle (PEN NEEDLES) 31G X 8 MM MISC; UAD  Dispense: 100 each; Refill: 1 - Ambulatory referral to Endocrinology  8. Anxiety and depression: - Patient denies thoughts of self-harm, suicidal ideations, homicidal ideations. - Continue Bupropion as prescribed.  - Patient declines counseling services and referral to Psychiatry.  - Follow-up with primary provider in 3 months or sooner if needed.  - buPROPion ER (WELLBUTRIN SR) 100 MG 12 hr tablet; Take 1 tablet (100 mg total) by mouth 2 (two) times daily.  Dispense: 180 tablet; Refill: 1  9. Essential hypertension: 10. Acute diastolic congestive heart failure (HCC): 11. Atrial fibrillation, unspecified type (HCC): 12. TIA (transient ischemic attack): 13. Atherosclerosis of native coronary artery of native heart, unspecified whether angina present: - Keep all scheduled appointments with Cardiology.     Patient was given clear instructions to go to Emergency Department or return to medical center if symptoms don't improve, worsen, or new problems develop.The patient verbalized understanding.  I discussed the assessment and treatment plan with the patient. The patient was provided an opportunity to ask questions and all were  answered. The patient agreed with the plan and demonstrated an understanding of the instructions.   The patient was advised to call back or seek an in-person evaluation if the symptoms worsen or if the condition fails to improve as anticipated.    Ricky Stabs, NP 12/24/2020, 8:11 AM Primary Care at [facility]

## 2020-12-21 ENCOUNTER — Emergency Department (HOSPITAL_COMMUNITY)
Admission: EM | Admit: 2020-12-21 | Discharge: 2020-12-21 | Disposition: A | Discharge status: Home / Self Care | Payer: Medicare HMO | Attending: Student | Admitting: Student

## 2020-12-21 ENCOUNTER — Other Ambulatory Visit: Payer: Self-pay

## 2020-12-21 ENCOUNTER — Emergency Department (HOSPITAL_COMMUNITY): Payer: Medicare HMO

## 2020-12-21 ENCOUNTER — Encounter (HOSPITAL_COMMUNITY): Payer: Self-pay | Admitting: Emergency Medicine

## 2020-12-21 DIAGNOSIS — S91101A Unspecified open wound of right great toe without damage to nail, initial encounter: Secondary | ICD-10-CM | POA: Diagnosis not present

## 2020-12-21 DIAGNOSIS — Z794 Long term (current) use of insulin: Secondary | ICD-10-CM | POA: Insufficient documentation

## 2020-12-21 DIAGNOSIS — Z7901 Long term (current) use of anticoagulants: Secondary | ICD-10-CM | POA: Diagnosis not present

## 2020-12-21 DIAGNOSIS — I10 Essential (primary) hypertension: Secondary | ICD-10-CM | POA: Diagnosis not present

## 2020-12-21 DIAGNOSIS — S99921A Unspecified injury of right foot, initial encounter: Secondary | ICD-10-CM

## 2020-12-21 DIAGNOSIS — Z79899 Other long term (current) drug therapy: Secondary | ICD-10-CM | POA: Diagnosis not present

## 2020-12-21 DIAGNOSIS — Z955 Presence of coronary angioplasty implant and graft: Secondary | ICD-10-CM | POA: Diagnosis not present

## 2020-12-21 DIAGNOSIS — I4891 Unspecified atrial fibrillation: Secondary | ICD-10-CM | POA: Insufficient documentation

## 2020-12-21 DIAGNOSIS — R58 Hemorrhage, not elsewhere classified: Secondary | ICD-10-CM | POA: Diagnosis not present

## 2020-12-21 DIAGNOSIS — J449 Chronic obstructive pulmonary disease, unspecified: Secondary | ICD-10-CM | POA: Insufficient documentation

## 2020-12-21 DIAGNOSIS — E119 Type 2 diabetes mellitus without complications: Secondary | ICD-10-CM | POA: Diagnosis not present

## 2020-12-21 DIAGNOSIS — L03115 Cellulitis of right lower limb: Secondary | ICD-10-CM | POA: Diagnosis not present

## 2020-12-21 DIAGNOSIS — W01198A Fall on same level from slipping, tripping and stumbling with subsequent striking against other object, initial encounter: Secondary | ICD-10-CM | POA: Insufficient documentation

## 2020-12-21 DIAGNOSIS — Z87891 Personal history of nicotine dependence: Secondary | ICD-10-CM | POA: Diagnosis not present

## 2020-12-21 DIAGNOSIS — Y9301 Activity, walking, marching and hiking: Secondary | ICD-10-CM | POA: Insufficient documentation

## 2020-12-21 DIAGNOSIS — S99911A Unspecified injury of right ankle, initial encounter: Secondary | ICD-10-CM | POA: Diagnosis present

## 2020-12-21 DIAGNOSIS — R739 Hyperglycemia, unspecified: Secondary | ICD-10-CM | POA: Diagnosis not present

## 2020-12-21 DIAGNOSIS — Z7984 Long term (current) use of oral hypoglycemic drugs: Secondary | ICD-10-CM | POA: Diagnosis not present

## 2020-12-21 MED ORDER — DOXYCYCLINE HYCLATE 100 MG PO CAPS: 100.0000 mg | ORAL_CAPSULE | Freq: Two times a day (BID) | ORAL | 0 refills | Status: DC

## 2020-12-21 NOTE — ED Notes (Signed)
Pt noted with possible lifting of great right toenail as well as laceration to tip of same toe with moderate amount of bleeding that is now controlled. Also noted with warm, reddened area to front right shin that she states "just popped up" without injury. Pedal pulse present and moderate to right foot. Pt states no pain at this time. No signs of fever.

## 2020-12-21 NOTE — ED Notes (Signed)
Pt is now soaking foot.

## 2020-12-21 NOTE — Discharge Instructions (Addendum)
You do not have a fracture in your toe today.  Your nail also appears to be intact.  Bleeding is controlled however you should return if it begins to bleed again he cannot stop it.  You have also been given a walking boot.  Utilize this as you are walking around so you do not reinjure your toe.  I have attached a foot doctor to these discharge papers for you to call if you feel as though your injury is not improving or he would like to discuss any other problems related to your feet, including your neuropathy.  I am also sending some doxycycline to the pharmacy.  This is an antibiotic that should help clear up your lower leg infection.    It was a pleasure to meet you today and I hope that you feel better.

## 2020-12-21 NOTE — ED Notes (Signed)
Wound care provided. Sterile dressing placed and CAM boot applied.

## 2020-12-21 NOTE — ED Provider Notes (Addendum)
[facility] EMERGENCY DEPARTMENT Provider Note   CSN: [id number] Arrival date & time: 12/21/20  5625     History Chief Complaint  Patient presents with   Foot Injury    On blood thinners    Nicole [name] is a 55 y.o. female with a past medical history of hypertension, CAD and diabetic neuropathy presenting with a complaint of right toe injury.  She reports that last night she was walking and stubbed her toe and it would not stop bleeding all night despite pressure.  She is on Eliquis.  Reports that she has no feeling in her foot due to her diabetic neuropathy.  Is not dizzy or short of breath.   Foot Injury     Past Medical History:  Diagnosis Date   CAP (community acquired pneumonia)    Streptococcus 01/2011   COPD (chronic obstructive pulmonary disease) (HCC)    Coronary atherosclerosis of native coronary artery    Diagnosed [place] 2006 - DES RCA, reports followup cath 2009 at [facility]    Dyslipidemia    Essential hypertension, benign    Morbid obesity (HCC)    Pancreatitis    Type 2 diabetes mellitus (HCC)     Patient Active Problem List   Diagnosis Date Noted   Hx of BKA, left (HCC) 09/09/2020   Vitamin D deficiency 07/30/2020   Chronic respiratory failure with hypoxia (HCC) 07/30/2020   Depression 07/30/2020   Atrial fibrillation (HCC) 07/30/2020   Acute osteomyelitis of toe, left (HCC)    Sepsis due to Lt foot osteo 01/23/2020   Morbid obesity with body mass index (BMI) of 50.0 to 59.9 in adult (HCC) 01/23/2020   Tobacco abuse--2 P/day Smoker 01/23/2020   Cellulitis of left foot 01/22/2020   Osteomyelitis of left foot (HCC) 01/22/2020   Leukocytosis 01/22/2020   Hypokalemia 01/22/2020   Hypoalbuminemia 01/22/2020   Hyperglycemia due to diabetes mellitus (HCC) 01/22/2020   Elevated alpha fetoprotein 01/22/2020   SIRS (systemic inflammatory response syndrome) (HCC) 01/22/2020   Diabetic ulcer of left foot (HCC) 12/05/2019   Cough 05/26/2012   TIA  (transient ischemic attack) 04/10/2012   Acute diastolic congestive heart failure (HCC) 11/12/2011   COPD (chronic obstructive pulmonary disease) (HCC) 11/09/2011   Dyspnea 06/10/2011   CAP (community acquired pneumonia) 02/19/2011   Coronary atherosclerosis of native coronary artery/Stent in 2006 02/19/2011   Dyslipidemia    Morbid obesity (HCC)    Diabetes mellitus type 2 in obese (HCC)    HTN (hypertension), benign     Past Surgical History:  Procedure Laterality Date   ABDOMINAL HERNIA REPAIR     ABDOMINAL HYSTERECTOMY     AMPUTATION Left 01/25/2020   Procedure: LEFT BELOW KNEE AMPUTATION;  Surgeon: [name], MD;  Location: [facility] OR;  Service: Orthopedics;  Laterality: Left;   CORONARY ANGIOPLASTY WITH STENT PLACEMENT  2006   KNEE SURGERY     NOSE SURGERY     Pilonidal cystectomy     TONSILLECTOMY       OB History     Gravida      Para      Term      Preterm      AB      Living  4      SAB      IAB      Ectopic      Multiple      Live Births  Family History  Problem Relation Age of Onset   Diabetes Mother    Heart failure Mother    Hypertension Mother    Kidney failure Mother    Ovarian cancer Mother    Asthma Son     Social History   Tobacco Use   Smoking status: Former    Packs/day: 0.50    Years: 20.00    Pack years: 10.00    Types: Cigarettes    Quit date: 01/22/2020    Years since quitting: 0.9   Smokeless tobacco: Never  Vaping Use   Vaping Use: Never used  Substance Use Topics   Alcohol use: No   Drug use: No    Home Medications Prior to Admission medications   Medication Sig Start Date End Date Taking? Authorizing Provider  acetaminophen (TYLENOL) 500 MG tablet Take 1 tablet (500 mg total) by mouth every 6 (six) hours as needed for moderate pain. 02/05/20   Amin, Loura Halt, MD  albuterol (PROVENTIL) (2.5 MG/3ML) 0.083% nebulizer solution Take 3 mLs (2.5 mg total) by nebulization every 6 (six) hours as  needed for wheezing. 07/30/20   Elenore Paddy, NP  albuterol (VENTOLIN HFA) 108 (90 Base) MCG/ACT inhaler Inhale 2 puffs into the lungs every 6 (six) hours as needed for wheezing. Shortness of breath 07/30/20   Elenore Paddy, NP  apixaban (ELIQUIS) 5 MG TABS tablet Take 1 tablet (5 mg total) by mouth 2 (two) times daily. Patient not taking: Reported on 10/03/2020 05/15/20   Elenore Paddy, NP  buPROPion [facility] SR) 100 MG 12 hr tablet Take 1 tablet (100 mg total) by mouth 2 (two) times daily. 07/30/20   Elenore Paddy, NP  celecoxib (CELEBREX) 200 MG capsule Take 1 capsule (200 mg total) by mouth 2 (two) times daily. 07/30/20   Elenore Paddy, NP  cephALEXin (KEFLEX) 500 MG capsule Take 1 capsule (500 mg total) by mouth 4 (four) times daily. 10/31/20   Bethann Berkshire, MD  Cholecalciferol 25 MCG (1000 UT) capsule Take 1,000 Units by mouth daily.    [provider]  clopidogrel (PLAVIX) 75 MG tablet Take 1 tablet (75 mg total) by mouth daily. 07/30/20   Elenore Paddy, NP  docusate sodium (COLACE) 100 MG capsule Take 1 capsule (100 mg total) by mouth 2 (two) times daily as needed for mild constipation. 07/30/20   Elenore Paddy, NP  DULoxetine (CYMBALTA) 60 MG capsule Take 1 capsule (60 mg total) by mouth daily. 07/30/20   Elenore Paddy, NP  EASY [facility] INSULIN SYRINGE 31G X 5/16" 1 ML MISC  09/21/19   [provider]  EPINEPHrine 0.3 mg/0.3 mL IJ SOAJ injection Inject 0.3 mg into the muscle as needed for anaphylaxis. 05/15/20   Elenore Paddy, NP  furosemide (LASIX) 80 MG tablet Take 1 tablet (80 mg total) by mouth daily. 07/30/20   Elenore Paddy, NP  glipiZIDE (GLUCOTROL) 10 MG tablet Take 1 tablet (10 mg total) by mouth in the morning and at bedtime. 07/30/20   Elenore Paddy, NP  insulin aspart (NOVOLOG FLEXPEN) 100 UNIT/ML FlexPen Inject 60 units into the skin three times a day with meals. 07/30/20   Elenore Paddy, NP  insulin detemir (LEVEMIR FLEXTOUCH) 100 UNIT/ML FlexPen Inject 80 Units into the skin 2  (two) times daily. 07/31/20   Elenore Paddy, NP  lisinopril (ZESTRIL) 2.5 MG tablet Take 1 tablet (2.5 mg total) by mouth daily. 07/30/20   Jiles Prows  E, NP  loperamide (IMODIUM) 2 MG capsule Take 1 capsule (2 mg total) by mouth every 6 (six) hours as needed for diarrhea or loose stools. 12/10/19   Erick Blinks, MD  metoprolol tartrate (LOPRESSOR) 50 MG tablet Take 1 tablet (50 mg total) by mouth 2 (two) times daily. 07/30/20   Elenore Paddy, NP  Multiple Vitamins-Calcium (ONE-A-DAY WOMENS FORMULA) TABS Take 1 tablet by mouth daily.    [provider]  mupirocin ointment (BACTROBAN) 2 % Apply topically daily. 12/10/19   Erick Blinks, MD  nitroGLYCERIN (NITROSTAT) 0.4 MG SL tablet Take one tablet every 5 minutes as needed for chest pains up to 3 tablets. 10/03/20   Pricilla Riffle, MD  oxyCODONE (OXY IR/ROXICODONE) 5 MG immediate release tablet Take 1 tablet (5 mg total) by mouth every 6 (six) hours as needed for severe pain. 05/15/20   Elenore Paddy, NP  pantoprazole (PROTONIX) 40 MG tablet Take 1 tablet (40 mg total) by mouth daily. 07/30/20   Elenore Paddy, NP  senna-docusate (SENOKOT-S) 8.6-50 MG tablet Take 1 tablet by mouth at bedtime as needed for mild constipation. 02/05/20   Amin, Loura Halt, MD  simvastatin (ZOCOR) 80 MG tablet Take 1 tablet (80 mg total) by mouth daily. 07/30/20   Elenore Paddy, NP  TRULICITY 1.5 MG/0.5ML SOPN Inject 1.5 mg into the skin once a week. 07/30/20   Elenore Paddy, NP    Allergies    Aspirin, Bee pollen, Bee venom, Food, Pineapple, E-mycin [erythromycin base], and Other  Review of Systems   Review of Systems  Musculoskeletal:  Negative for arthralgias.  Skin:  Positive for wound.  Neurological:  Positive for numbness. Negative for dizziness and weakness.  All other systems reviewed and are negative.  Physical Exam Updated Vital Signs BP (!) 168/74 (BP Location: Left Arm)   Pulse 88   Temp 98.3 F (36.8 C) (Oral)   Resp 20   Ht 5\' 4"  (1.626 m)    Wt (!) 136.7 kg   SpO2 99%   BMI 51.73 kg/m   Physical Exam Vitals and nursing note reviewed.  Constitutional:      Appearance: Normal appearance.  HENT:     Head: Normocephalic and atraumatic.  Eyes:     General: No scleral icterus.    Conjunctiva/sclera: Conjunctivae normal.  Cardiovascular:     Pulses: Normal pulses.  Pulmonary:     Effort: Pulmonary effort is normal. No respiratory distress.  Musculoskeletal:        General: Tenderness present.     Comments: Area of cellulitis to anterior right shin.  Status post left lower extremity amputation  Skin:    General: Skin is warm and dry.     Findings: Lesion present. No rash.     Comments: Wound noted to right great toe.  Bleeding noted at proximal nail bed.  Nailbed intact and patient with full range of motion of the toe.  Strong pulses.  Neurological:     Mental Status: She is alert.  Psychiatric:        Mood and Affect: Mood normal.    ED Results / Procedures / Treatments   Labs (all labs ordered are listed, but only abnormal results are displayed) Labs Reviewed - No data to display  EKG None  Radiology DG Toe Great Right  Result Date: 12/21/2020 CLINICAL DATA:  Acute right great toe pain after injury today. EXAM: RIGHT GREAT TOE COMPARISON:  October 31, 2020. FINDINGS: There is  no evidence of fracture or dislocation. There is no evidence of arthropathy or other focal bone abnormality. Soft tissues are unremarkable. IMPRESSION: Negative. Electronically Signed   By: Lupita Raider M.D.   On: 12/21/2020 10:13    Procedures Procedures   Medications Ordered in ED Medications - No data to display  ED Course  I have reviewed the triage vital signs and the nursing notes.  Pertinent labs & imaging results that were available during my care of the patient were reviewed by me and considered in my medical decision making (see chart for details).    MDM Rules/Calculators/A&P Patient was seen by myself as well as MD  Kommor.  She had a large amount of dried blood on her big toe.  This was cleaned up to reveal no lacerations and an intact nail bed.  She had good distal pulses as well.  X-ray was obtained and negative for fracture.  Patient's wound has been dressed and she has been put in a walking boot.  We discussed at home care of such an injury.  She also understands that she must be careful being that she has no feeling in her right foot.  I attach a podiatrist to her discharge papers in the event that she needs 1 down the line.  She also had a area of cellulitis to the anterior right shin.  She reports needing antibiotics for this in the past.  I sent doxycycline to her pharmacy.  Stable for discharge with her daughter. Final Clinical Impression(s) / ED Diagnoses Final diagnoses:  Injury of right foot, initial encounter  Cellulitis of right lower extremity    Rx / DC Orders Results and diagnoses were explained to the patient. Return precautions discussed in full. Patient had no additional questions and expressed complete understanding.     Saddie Benders, PA-C 12/21/20 1200    Saddie Benders, PA-C 12/21/20 1204    Glendora Score, MD 12/21/20 [phone]

## 2020-12-21 NOTE — ED Triage Notes (Signed)
PT arrives from home via [facility] with c/o uncontrolled bleeding to right great toe after stubbing it last night. Pt reports taking plavix daily. She also had a fall yesterday and denies hitting head or LOC. CBG per ems was 501. Prior to transport pt states she took 60un novolog, 100un levemir, and her trulicity pen. States this is what she normally takes at home. Bleeding appears controlled at this time. VS stable.

## 2020-12-23 ENCOUNTER — Other Ambulatory Visit: Payer: Self-pay

## 2020-12-23 ENCOUNTER — Ambulatory Visit (INDEPENDENT_AMBULATORY_CARE_PROVIDER_SITE_OTHER): Payer: Medicare HMO | Admitting: Family

## 2020-12-23 ENCOUNTER — Encounter: Payer: Self-pay | Admitting: Family

## 2020-12-23 VITALS — BP 158/81 | HR 87 | Resp 17

## 2020-12-23 DIAGNOSIS — Z23 Encounter for immunization: Secondary | ICD-10-CM

## 2020-12-23 DIAGNOSIS — I5031 Acute diastolic (congestive) heart failure: Secondary | ICD-10-CM | POA: Diagnosis not present

## 2020-12-23 DIAGNOSIS — F32A Depression, unspecified: Secondary | ICD-10-CM | POA: Diagnosis not present

## 2020-12-23 DIAGNOSIS — E1169 Type 2 diabetes mellitus with other specified complication: Secondary | ICD-10-CM

## 2020-12-23 DIAGNOSIS — J449 Chronic obstructive pulmonary disease, unspecified: Secondary | ICD-10-CM | POA: Diagnosis not present

## 2020-12-23 DIAGNOSIS — I4891 Unspecified atrial fibrillation: Secondary | ICD-10-CM | POA: Diagnosis not present

## 2020-12-23 DIAGNOSIS — I10 Essential (primary) hypertension: Secondary | ICD-10-CM

## 2020-12-23 DIAGNOSIS — I2510 Atherosclerotic heart disease of native coronary artery without angina pectoris: Secondary | ICD-10-CM

## 2020-12-23 DIAGNOSIS — E669 Obesity, unspecified: Secondary | ICD-10-CM

## 2020-12-23 DIAGNOSIS — Z9981 Dependence on supplemental oxygen: Secondary | ICD-10-CM

## 2020-12-23 DIAGNOSIS — L03115 Cellulitis of right lower limb: Secondary | ICD-10-CM

## 2020-12-23 DIAGNOSIS — G459 Transient cerebral ischemic attack, unspecified: Secondary | ICD-10-CM | POA: Diagnosis not present

## 2020-12-23 DIAGNOSIS — Z89512 Acquired absence of left leg below knee: Secondary | ICD-10-CM

## 2020-12-23 DIAGNOSIS — F419 Anxiety disorder, unspecified: Secondary | ICD-10-CM | POA: Diagnosis not present

## 2020-12-23 DIAGNOSIS — Z7689 Persons encountering health services in other specified circumstances: Secondary | ICD-10-CM

## 2020-12-23 DIAGNOSIS — Z09 Encounter for follow-up examination after completed treatment for conditions other than malignant neoplasm: Secondary | ICD-10-CM

## 2020-12-23 LAB — POCT GLYCOSYLATED HEMOGLOBIN (HGB A1C): HbA1c, POC (controlled diabetic range): 12.4 % — AB (ref 0.0–7.0)

## 2020-12-23 MED ORDER — CELECOXIB 200 MG PO CAPS: 200.0000 mg | ORAL_CAPSULE | Freq: Two times a day (BID) | ORAL | 0 refills | Status: AC

## 2020-12-23 MED ORDER — PEN NEEDLES 31G X 8 MM MISC: 1 refills | Status: DC

## 2020-12-23 MED ORDER — NOVOLOG FLEXPEN 100 UNIT/ML ~~LOC~~ SOPN: PEN_INJECTOR | SUBCUTANEOUS | 2 refills | Status: DC

## 2020-12-23 MED ORDER — BUPROPION HCL ER (SR) 100 MG PO TB12: 100.0000 mg | ORAL_TABLET | Freq: Two times a day (BID) | ORAL | 1 refills | Status: DC

## 2020-12-23 MED ORDER — GLIPIZIDE 10 MG PO TABS: 10.0000 mg | ORAL_TABLET | Freq: Two times a day (BID) | ORAL | 0 refills | Status: DC

## 2020-12-23 NOTE — Progress Notes (Signed)
Order portable oxygen from [facility] Patient is on 3LO2

## 2020-12-23 NOTE — Patient Instructions (Signed)
Thank you for choosing Primary Care at [facility] for your medical home!    Nicole [name] was seen by Rema Fendt, NP today.   Nicole [name]'s primary care provider is Rema Fendt, NP.   For the best care possible,  you should try to see Ricky Stabs, NP whenever you come to clinic.   We look forward to seeing you again soon!  If you have any questions about your visit today,  please call us at [phone]  Or feel free to reach your provider via MyChart.    Keeping you healthy   Get these tests Blood pressure- Have your blood pressure checked once a year by your healthcare provider.  Normal blood pressure is 120/80. Weight- Have your body mass index (BMI) calculated to screen for obesity.  BMI is a measure of body fat based on height and weight. You can also calculate your own BMI at https://www.west-esparza.com/. Cholesterol- Have your cholesterol checked regularly starting at age 62, sooner may be necessary if you have diabetes, high blood pressure, if a family member developed heart diseases at an early age or if you smoke.  Chlamydia, HIV, and other sexual transmitted disease- Get screened each year until the age of 19 then within three months of each new sexual partner. Diabetes- Have your blood sugar checked regularly if you have high blood pressure, high cholesterol, a family history of diabetes or if you are overweight.   Get these vaccines Flu shot- Every fall. Tetanus shot- Every 10 years. Menactra- Single dose; prevents meningitis.   Take these steps Don't smoke- If you do smoke, ask your healthcare provider about quitting. For tips on how to quit, go to www.smokefree.gov or call 1-800-QUIT-NOW. Be physically active- Exercise 5 days a week for at least 30 minutes.  If you are not already physically active start slow and gradually work up to 30 minutes of moderate physical activity.  Examples of moderate activity include walking briskly, mowing the yard, dancing,  swimming bicycling, etc. Eat a healthy diet- Eat a variety of healthy foods such as fruits, vegetables, low fat milk, low fat cheese, yogurt, lean meats, poultry, fish, beans, tofu, etc.  For more information on healthy eating, go to www.thenutritionsource.org Drink alcohol in moderation- Limit alcohol intake two drinks or less a day.  Never drink and drive. Dentist- Brush and floss teeth twice daily; visit your dentis twice a year. Depression-Your emotional health is as important as your physical health.  If you're feeling down, losing interest in things you normally enjoy please talk with your healthcare provider. Gun Safety- If you keep a gun in your home, keep it unloaded and with the safety lock on.  Bullets should be stored separately. Helmet use- Always wear a helmet when riding a motorcycle, bicycle, rollerblading or skateboarding. Safe sex- If you may be exposed to a sexually transmitted infection, use a condom Seat belts- Seat bels can save your life; always wear one. Smoke/Carbon Monoxide detectors- These detectors need to be installed on the appropriate level of your home.  Replace batteries at least once a year. Skin Cancer- When out in the sun, cover up and use sunscreen SPF 15 or higher. Violence- If anyone is threatening or hurting you, please tell your healthcare provider.

## 2020-12-23 NOTE — Progress Notes (Signed)
Diabetes discussed in office.

## 2020-12-24 ENCOUNTER — Ambulatory Visit (INDEPENDENT_AMBULATORY_CARE_PROVIDER_SITE_OTHER): Payer: Medicare HMO | Admitting: Podiatry

## 2020-12-24 ENCOUNTER — Telehealth: Payer: Self-pay

## 2020-12-24 DIAGNOSIS — S91201A Unspecified open wound of right great toe with damage to nail, initial encounter: Secondary | ICD-10-CM

## 2020-12-24 MED ORDER — GENTAMICIN SULFATE 0.1 % EX CREA: 1.0000 "application " | TOPICAL_CREAM | Freq: Two times a day (BID) | CUTANEOUS | 1 refills | Status: DC

## 2020-12-24 NOTE — Telephone Encounter (Signed)
Call placed to [facility] regarding patient's request for portable O2. Spoke to [facility] who said that they have 2 accts for her and there is no mention of any requests for more O2.  Mia said they will call the patient and clarify what she needs. If they need information/orders  from the provider, they will contact [facility].

## 2020-12-24 NOTE — Telephone Encounter (Signed)
Thank you Jane! 

## 2020-12-24 NOTE — Progress Notes (Signed)
   HPI: 55 y.o. female presenting today as a new patient PMHx diabetes mellitus type 2 presenting with her daughter for evaluation of an injury to the patient sustained on 12/21/2020 to the right hallux when she stubbed her toe.  She had a heavy amount of bleeding to the area and she went to urgent care where x-rays were taken and negative for fracture.  She was placed in the air fracture walker and referred here for follow-up treatment and evaluation  Past Medical History:  Diagnosis Date   CAP (community acquired pneumonia)    Streptococcus 01/2011   COPD (chronic obstructive pulmonary disease) (HCC)    Coronary atherosclerosis of native coronary artery    Diagnosed [place] 2006 - DES RCA, reports followup cath 2009 at [facility]    Dyslipidemia    Essential hypertension, benign    Morbid obesity (HCC)    Pancreatitis    Type 2 diabetes mellitus (HCC)      Physical Exam: General: The patient is alert and oriented x3 in no acute distress.  Dermatology: Injury to the right hallux nail plate noted with dry sanguinous drainage.  The right hallux nail plate is loosely adhered.  Vascular: No edema or erythema noted. Capillary refill within normal limits.  Neurological: Light touch and protective threshold absent bilaterally.   Musculoskeletal Exam: H/o BKA LLE. 01/2020.   Assessment: 1.  Traumatic injury right hallux nail plate   Plan of Care:  1. Patient evaluated. X-Rays reviewed that were taken in the ED.  2.  Decision was made today to perform a total temporary nail avulsion of the right hallux nail plate due to the trauma the nail is in an unhealthy unviable state.  The patient and daughter both agree.  No local anesthesia was utilized since the patient is completely neuropathic. 3.  The toenail was avulsed in its entirety and a light dressing applied.  Post care instructions provided 4.  Prescription for gentamicin cream applied daily 5.  Continue oral Keflex as per ED/urgent  care  6.  Postsurgical shoe dispensed.  Weightbearing as tolerated.  Discontinue cam boot  7.  Return to clinic in 2 weeks  *Daughter's name is Elesa Hacker, DPM [facility]  Dr. Felecia Shelling, DPM    [address] [place], [place] [address]                Office [phone]  Fax [phone]

## 2021-01-01 DIAGNOSIS — G4733 Obstructive sleep apnea (adult) (pediatric): Secondary | ICD-10-CM | POA: Diagnosis not present

## 2021-01-07 ENCOUNTER — Telehealth: Payer: Self-pay | Admitting: Family

## 2021-01-07 NOTE — Telephone Encounter (Signed)
Forms faxed

## 2021-01-07 NOTE — Telephone Encounter (Signed)
Fleet Contras from [employer] [phone] ext [phone] Calling regarding a fax status from 12/24/20  and 12/31/20. Please advise and thank you.

## 2021-01-08 NOTE — Patient Instructions (Signed)

## 2021-01-09 ENCOUNTER — Ambulatory Visit (INDEPENDENT_AMBULATORY_CARE_PROVIDER_SITE_OTHER): Payer: Medicare HMO | Admitting: Nurse Practitioner

## 2021-01-09 ENCOUNTER — Encounter: Payer: Self-pay | Admitting: Nurse Practitioner

## 2021-01-09 ENCOUNTER — Other Ambulatory Visit: Payer: Self-pay

## 2021-01-09 VITALS — BP 157/81 | HR 87 | Ht 64.0 in | Wt 319.0 lb

## 2021-01-09 DIAGNOSIS — E669 Obesity, unspecified: Secondary | ICD-10-CM

## 2021-01-09 DIAGNOSIS — E782 Mixed hyperlipidemia: Secondary | ICD-10-CM | POA: Diagnosis not present

## 2021-01-09 DIAGNOSIS — I10 Essential (primary) hypertension: Secondary | ICD-10-CM | POA: Diagnosis not present

## 2021-01-09 DIAGNOSIS — Z794 Long term (current) use of insulin: Secondary | ICD-10-CM | POA: Diagnosis not present

## 2021-01-09 DIAGNOSIS — E1169 Type 2 diabetes mellitus with other specified complication: Secondary | ICD-10-CM

## 2021-01-09 DIAGNOSIS — E1165 Type 2 diabetes mellitus with hyperglycemia: Secondary | ICD-10-CM

## 2021-01-09 MED ORDER — NOVOLOG FLEXPEN 100 UNIT/ML ~~LOC~~ SOPN: 40.0000 [IU] | PEN_INJECTOR | Freq: Three times a day (TID) | SUBCUTANEOUS | 2 refills | Status: DC

## 2021-01-09 MED ORDER — BLOOD GLUCOSE METER KIT
PACK | 0 refills | Status: DC
Start: 2021-01-09 — End: 2023-11-17

## 2021-01-09 MED ORDER — LEVEMIR FLEXTOUCH 100 UNIT/ML ~~LOC~~ SOPN: 100.0000 [IU] | PEN_INJECTOR | Freq: Two times a day (BID) | SUBCUTANEOUS | 1 refills | Status: DC

## 2021-01-09 NOTE — Progress Notes (Signed)
Endocrinology Consult Note       01/09/2021, 10:50 AM   Subjective:    Patient ID: Nicole [name], female    DOB: December 29, 1965.  Nicole [name] is being seen in consultation for management of currently uncontrolled symptomatic diabetes requested by  Nicole Herter, NP.   Past Medical History:  Diagnosis Date   CAP (community acquired pneumonia)    Streptococcus 01/2011   COPD (chronic obstructive pulmonary disease) ([place])    Coronary atherosclerosis of native coronary artery    Diagnosed [place] 2006 - DES RCA, reports followup cath 2009 at [facility]    Dyslipidemia    Essential hypertension, benign    Morbid obesity ([place])    Pancreatitis    Type 2 diabetes mellitus (Nicole [name])     Past Surgical History:  Procedure Laterality Date   ABDOMINAL HERNIA REPAIR     ABDOMINAL HYSTERECTOMY     AMPUTATION Left 01/25/2020   Procedure: LEFT BELOW KNEE AMPUTATION;  Surgeon: Newt Minion, MD;  Location: [place];  Service: Orthopedics;  Laterality: Left;   CORONARY ANGIOPLASTY WITH STENT PLACEMENT  2006   KNEE SURGERY     NOSE SURGERY     Pilonidal cystectomy     TONSILLECTOMY      Social History   Socioeconomic History   Marital status: Divorced    Spouse name: Not on file   Number of children: 3   Years of education: Not on file   Highest education level: Not on file  Occupational History   Occupation: Home health aide    Employer: [employer]  Tobacco Use   Smoking status: Former    Packs/day: 0.50    Years: 20.00    Pack years: 10.00    Types: Cigarettes    Quit date: 01/22/2020    Years since quitting: 0.9   Smokeless tobacco: Never  Vaping Use   Vaping Use: Never used  Substance and Sexual Activity   Alcohol use: No   Drug use: No   Sexual activity: Yes    Birth control/protection: Surgical  Other Topics Concern   Not on file  Social History Narrative   Not on file   Social  Determinants of Health   Financial Resource Strain: Not on file  Food Insecurity: Not on file  Transportation Needs: Not on file  Physical Activity: Not on file  Stress: Not on file  Social Connections: Not on file    Family History  Problem Relation Age of Onset   Diabetes Mother    Heart failure Mother    Hypertension Mother    Kidney failure Mother    Ovarian cancer Mother    Asthma Son     Outpatient Encounter Medications as of 01/09/2021  Medication Sig   acetaminophen (TYLENOL) 500 MG tablet Take 1 tablet (500 mg total) by mouth every 6 (six) hours as needed for moderate pain.   albuterol (PROVENTIL) (2.5 MG/3ML) 0.083% nebulizer solution Take 3 mLs (2.5 mg total) by nebulization every 6 (six) hours as needed for wheezing.   albuterol (VENTOLIN HFA) 108 (90 Base) MCG/ACT inhaler Inhale 2 puffs into the lungs every 6 (six) hours as needed  for wheezing. Shortness of breath   BINAXNOW COVID-19 AG HOME TEST KIT See admin instructions.   blood glucose meter kit and supplies Dispense based on patient and insurance preference. Use four times daily as directed. (FOR ICD-10 E10.9, E11.9).   buPROPion ER (WELLBUTRIN SR) 100 MG 12 hr tablet Take 1 tablet (100 mg total) by mouth 2 (two) times daily.   celecoxib (CELEBREX) 200 MG capsule Take 1 capsule (200 mg total) by mouth 2 (two) times daily.   Cholecalciferol 25 MCG (1000 UT) capsule Take 1,000 Units by mouth daily.   clopidogrel (PLAVIX) 75 MG tablet Take 1 tablet (75 mg total) by mouth daily.   docusate sodium (COLACE) 100 MG capsule Take 1 capsule (100 mg total) by mouth 2 (two) times daily as needed for mild constipation.   DULoxetine (CYMBALTA) 60 MG capsule Take 1 capsule (60 mg total) by mouth daily.   EASY TOUCH INSULIN SYRINGE 31G X 5/16" 1 ML MISC    EPINEPHrine 0.3 mg/0.3 mL IJ SOAJ injection Inject 0.3 mg into the muscle as needed for anaphylaxis.   furosemide (LASIX) 80 MG tablet Take 1 tablet (80 mg total) by mouth  daily.   gentamicin cream (GARAMYCIN) 0.1 % Apply 1 application topically 2 (two) times daily.   glipiZIDE (GLUCOTROL) 10 MG tablet Take 1 tablet (10 mg total) by mouth in the morning and at bedtime.   Insulin Pen Needle (PEN NEEDLES) 31G X 8 MM MISC UAD   lisinopril (ZESTRIL) 2.5 MG tablet Take 1 tablet (2.5 mg total) by mouth daily.   loperamide (IMODIUM) 2 MG capsule Take 1 capsule (2 mg total) by mouth every 6 (six) hours as needed for diarrhea or loose stools.   metoprolol tartrate (LOPRESSOR) 50 MG tablet Take 1 tablet (50 mg total) by mouth 2 (two) times daily.   Multiple Vitamins-Calcium (ONE-A-DAY WOMENS FORMULA) TABS Take 1 tablet by mouth daily.   mupirocin ointment (BACTROBAN) 2 % Apply topically daily.   nitroGLYCERIN (NITROSTAT) 0.4 MG SL tablet Take one tablet every 5 minutes as needed for chest pains up to 3 tablets.   pantoprazole (PROTONIX) 40 MG tablet Take 1 tablet (40 mg total) by mouth daily.   senna-docusate (SENOKOT-S) 8.6-50 MG tablet Take 1 tablet by mouth at bedtime as needed for mild constipation.   simvastatin (ZOCOR) 80 MG tablet Take 1 tablet (80 mg total) by mouth daily.   TRULICITY 1.5 ZO/1.0RU SOPN Inject 1.5 mg into the skin once a week.   [DISCONTINUED] insulin aspart (NOVOLOG FLEXPEN) 100 UNIT/ML FlexPen Inject 60 units into the skin three times a day with meals.   [DISCONTINUED] insulin detemir (LEVEMIR FLEXTOUCH) 100 UNIT/ML FlexPen Inject 80 Units into the skin 2 (two) times daily.   insulin aspart (NOVOLOG FLEXPEN) 100 UNIT/ML FlexPen Inject 40-46 Units into the skin 3 (three) times daily with meals. Inject 60 units into the skin three times a day with meals.   insulin detemir (LEVEMIR FLEXTOUCH) 100 UNIT/ML FlexPen Inject 100 Units into the skin 2 (two) times daily.   [DISCONTINUED] apixaban (ELIQUIS) 5 MG TABS tablet Take 1 tablet (5 mg total) by mouth 2 (two) times daily. (Patient not taking: Reported on 10/03/2020)   [DISCONTINUED] cephALEXin (KEFLEX)  500 MG capsule Take 1 capsule (500 mg total) by mouth 4 (four) times daily. (Patient not taking: Reported on 01/09/2021)   [DISCONTINUED] doxycycline (VIBRAMYCIN) 100 MG capsule Take 1 capsule (100 mg total) by mouth 2 (two) times daily. (Patient not taking: Reported on  01/09/2021)   No facility-administered encounter medications on file as of 01/09/2021.    ALLERGIES: Allergies  Allergen Reactions   Aspirin Anaphylaxis and Other (See Comments)    Throat closing    Bee Pollen Anaphylaxis   Bee Venom Anaphylaxis   Food Anaphylaxis    PINEAPPLE   Pineapple Anaphylaxis   E-Mycin [Erythromycin Base] Nausea And Vomiting   Other Rash    VACCINATION STATUS: Immunization History  Administered Date(s) Administered   Influenza,inj,Quad PF,6+ Mos 12/25/2019, 12/23/2020   Moderna Sars-Covid-2 Vaccination 06/25/2019, 07/25/2019   Pneumococcal Polysaccharide-23 04/11/2012, 12/25/2019    Diabetes She presents for her initial diabetic visit. She has type 2 diabetes mellitus. Onset time: diagnosed at approx age of 74. Her disease course has been worsening. There are no hypoglycemic associated symptoms. Associated symptoms include blurred vision, fatigue, foot paresthesias, polydipsia and polyuria. There are no hypoglycemic complications. Symptoms are stable. Diabetic complications include heart disease, peripheral neuropathy and PVD. (Hx Left BKA, MI) Risk factors for coronary artery disease include diabetes mellitus, dyslipidemia, family history, obesity, hypertension and sedentary lifestyle. Current diabetic treatment includes intensive insulin program and oral agent (monotherapy) (and Trulicity 1.5). She is compliant with treatment most of the time (per referral note, she has been refusing her short acting insulin). Her weight is decreasing steadily. She is following a generally unhealthy diet. When asked about meal planning, she reported none. She has not had a previous visit with a dietitian. She  rarely participates in exercise. (She presents today for her consultation with no meter or logs to review.  Her most recent A1c on 11/1 was 12.4%, increasing from her previous A1c of 9.1%.  She monitors glucose 4 times daily (currently using daughters meter).  She admits to drinking mostly diet soda with some water (uses lemon to flavor it).  She does not have a set eating pattern, mostly eats only 1 meal per day but she does have a tendency to eat late night snacks.  She does not engage in routine physical exercise, is limited due to L BKA in wheelchair.  She denies any significant hypoglycemia.) An ACE inhibitor/angiotensin II receptor blocker is being taken. She sees a podiatrist.Eye exam is current.  Hypertension This is a chronic problem. The current episode started more than 1 year ago. The problem is unchanged. The problem is uncontrolled. Associated symptoms include blurred vision. There are no associated agents to hypertension. Risk factors for coronary artery disease include diabetes mellitus, dyslipidemia, family history, obesity and sedentary lifestyle. Past treatments include ACE inhibitors, beta blockers and diuretics. The current treatment provides mild improvement. Compliance problems include diet and exercise.  Hypertensive end-organ damage includes CAD/MI, heart failure and PVD.  Hyperlipidemia This is a chronic problem. The current episode started more than 1 year ago. The problem is uncontrolled. Recent lipid tests were reviewed and are variable. Exacerbating diseases include diabetes and obesity. Factors aggravating her hyperlipidemia include beta blockers. Current antihyperlipidemic treatment includes statins. The current treatment provides mild improvement of lipids. Compliance problems include adherence to diet and adherence to exercise.  Risk factors for coronary artery disease include diabetes mellitus, dyslipidemia, family history, obesity, hypertension and a sedentary lifestyle.     Review of systems  Constitutional: + Minimally fluctuating body weight, current Body mass index is 54.76 kg/m., + fatigue, no subjective hyperthermia, no subjective hypothermia Eyes: + blurry vision, no xerophthalmia ENT: no sore throat, no nodules palpated in throat, no dysphagia/odynophagia, no hoarseness Cardiovascular: no chest pain, no shortness of breath, no  palpitations, + leg swelling Respiratory: no cough, + shortness of breath- hx COPD on chronic O2 Gastrointestinal: no nausea/vomiting/diarrhea Musculoskeletal: no muscle/joint aches, WC bound due to hx of L BKA Skin: no rashes, no hyperemia, non healing wound to right lower leg- has been on antibiotics for it but has not cleared up completely Neurological: no tremors, no numbness, no tingling, no dizziness Psychiatric: no depression, no anxiety  Objective:     BP (!) 157/81   Pulse 87   Ht 5' 4"  (1.626 m)   Wt (!) 319 lb (144.7 kg)   BMI 54.76 kg/m   Wt Readings from Last 3 Encounters:  01/09/21 (!) 319 lb (144.7 kg)  12/21/20 (!) 301 lb 5.9 oz (136.7 kg)  10/31/20 (!) 312 lb (141.5 kg)     BP Readings from Last 3 Encounters:  01/09/21 (!) 157/81  12/23/20 (!) 158/81  12/21/20 (!) 141/61     Physical Exam- Limited  Constitutional:  Body mass index is 54.76 kg/m. , not in acute distress, normal state of mind Eyes:  EOMI, no exophthalmos Neck: Supple Cardiovascular: RRR, no murmurs, rubs, or gallops, 1+ pitting edema to RLE Respiratory: Adequate breathing efforts, no crackles, rales, rhonchi, or wheezing, diminished lung sounds throughout, chronic O2 Musculoskeletal: WC bound- hx L BKA Skin:  no rashes, no hyperemia, non healing wound to right lower leg with associated surrounding erythema Neurological: no tremor with outstretched hands    CMP ( most recent) CMP     Component Value Date/Time   NA 136 10/31/2020 1059   K 5.0 10/31/2020 1059   CL 97 (L) 10/31/2020 1059   CO2 29 10/31/2020 1059    GLUCOSE 444 (H) 10/31/2020 1059   BUN 23 (H) 10/31/2020 1059   CREATININE 0.90 10/31/2020 1059   CREATININE 0.86 07/30/2020 1558   CALCIUM 9.2 10/31/2020 1059   PROT 7.2 10/31/2020 1059   ALBUMIN 3.5 10/31/2020 1059   AST 31 10/31/2020 1059   ALT 20 10/31/2020 1059   ALKPHOS 121 10/31/2020 1059   BILITOT 0.4 10/31/2020 1059   GFRNONAA >60 10/31/2020 1059   GFRNONAA 76 07/30/2020 1558   GFRAA 88 07/30/2020 1558     Diabetic Labs (most recent): Lab Results  Component Value Date   HGBA1C 12.4 (A) 12/23/2020   HGBA1C 9.1 (H) 05/15/2020   HGBA1C 9.6 (H) 12/25/2019     Lipid Panel ( most recent) Lipid Panel     Component Value Date/Time   CHOL 117 12/25/2019 1359   TRIG 208 (H) 12/25/2019 1359   HDL 25 (L) 12/25/2019 1359   CHOLHDL 4.7 12/25/2019 1359   VLDL 51 (H) 04/11/2012 0253   LDLCALC 65 12/25/2019 1359      Lab Results  Component Value Date   TSH 2.06 12/25/2019   TSH 2.800 04/11/2012   TSH 4.814 (H) 11/10/2011   TSH 2.672 02/19/2011   FREET4 1.2 12/25/2019           Assessment & Plan:   1) Type 2 diabetes mellitus with hyperglycemia, with long-term current use of insulin ([place])  She presents today for her consultation with no meter or logs to review.  Her most recent A1c on 11/1 was 12.4%, increasing from her previous A1c of 9.1%.  She monitors glucose 4 times daily (currently using daughters meter).  She admits to drinking mostly diet soda with some water (uses lemon to flavor it).  She does not have a set eating pattern, mostly eats only 1 meal per day but  she does have a tendency to eat late night snacks.  She does not engage in routine physical exercise, is limited due to L BKA in wheelchair.  She denies any significant hypoglycemia.  - KAJAL SCALICI has currently uncontrolled symptomatic type 2 DM since 55 years of age, with most recent A1c of 12.4 %.   -Recent labs reviewed.  - I had a long discussion with her about the progressive nature of  diabetes and the pathology behind its complications. -her diabetes is complicated by PVD- L BKA, MI, CAD, CHF and she remains at a high risk for more acute and chronic complications which include CAD, CVA, CKD, retinopathy, and neuropathy. These are all discussed in detail with her.  - I have counseled her on diet and weight management by adopting a carbohydrate restricted/protein rich diet. Patient is encouraged to switch to unprocessed or minimally processed complex starch and increased protein intake (animal or plant source), fruits, and vegetables. -  she is advised to stick to a routine mealtimes to eat 3 meals a day and avoid unnecessary snacks (to snack only to correct hypoglycemia).   - she acknowledges that there is a room for improvement in her food and drink choices. - Suggestion is made for her to avoid simple carbohydrates from her diet including Cakes, Sweet Desserts, Ice Cream, Soda (diet and regular), Sweet Tea, Candies, Chips, Cookies, Store Bought Juices, Alcohol in Excess of 1-2 drinks a day, Artificial Sweeteners, Coffee Creamer, and "Sugar-free" Products. This will help patient to have more stable blood glucose profile and potentially avoid unintended weight gain.  - she will be scheduled with Jearld Fenton, RDN, CDE for diabetes education.  - I have approached her with the following individualized plan to manage her diabetes and patient agrees:   -She is on very large quantity of insulin (over 200 units per day).  It is likely she will need more concentrated insulin to maximize absorption- we discussed this today.  For the time being, she is advised to continue her Levemir 100 units SQ twice daily and adjust her Novolog to 40-46 units TID with meals if glucose is above 90 and she is eating (Specific instructions on how to titrate insulin dosage based on glucose readings given to patient in writing).    -she is encouraged to continue monitoring glucose 4 times daily, before  meals and before bed, to log their readings on the clinic sheets provided, and bring them to review at follow up appointment in 2 weeks.  She could greatly benefit from CGM device, will consider ordering one at next follow up.  - she is warned not to take insulin without proper monitoring per orders. - Adjustment parameters are given to her for hypo and hyperglycemia in writing. - she is encouraged to call clinic for blood glucose levels less than 70 or above 300 mg /dl. - she is advised to continue Glipizide 10 mg po twice daily and Trulicity 1.5 mg SQ weekly, therapeutically suitable for patient .  - Specific targets for  A1c; LDL, HDL, and Triglycerides were discussed with the patient.  2) Blood Pressure /Hypertension:  her blood pressure is not controlled to target.   she is advised to continue her current medications including Lasix 80 mg po daily, Lisinopril 2.5 mg p.o. daily with breakfast, and Metoprolol 50 mg po twice daily.  3) Lipids/Hyperlipidemia:    Review of her recent lipid panel from 12/25/19 showed controlled LDL at 65 and elevated triglycerides of 208 .  she is advised to continue Simvastatin 80 mg daily at bedtime.  Side effects and precautions discussed with her.  She is also advised to avoid fried foods and butter.  4)  Weight/Diet:  her Body mass index is 54.76 kg/m.  -  clearly complicating her diabetes care.   she is a candidate for weight loss. I discussed with her the fact that loss of 5 - 10% of her  current body weight will have the most impact on her diabetes management.  Exercise, and detailed carbohydrates information provided  -  detailed on discharge instructions.  5) Chronic Care/Health Maintenance: -she is on ACEI/ARB and Statin medications and is encouraged to initiate and continue to follow up with Ophthalmology, Dentist, Podiatrist at least yearly or according to recommendations, and advised to stay away from smoking. I have recommended yearly flu vaccine and  pneumonia vaccine at least every 5 years; moderate intensity exercise for up to 150 minutes weekly; and sleep for at least 7 hours a day.  - she is advised to maintain close follow up with Nicole Herter, NP for primary care needs, as well as her other providers for optimal and coordinated care.   - Time spent in this patient care: 60 min, of which > 50% was spent in counseling her about her diabetes and the rest reviewing her blood glucose logs, discussing her hypoglycemia and hyperglycemia episodes, reviewing her current and previous labs/studies (including abstraction from other facilities) and medications doses and developing a long term treatment plan based on the latest standards of care/guidelines; and documenting her care.    Please refer to Patient Instructions for Blood Glucose Monitoring and Insulin/Medications Dosing Guide" in media tab for additional information. Please also refer to "Patient Self Inventory" in the Media tab for reviewed elements of pertinent patient history.  Vilinda Blanks Lavell participated in the discussions, expressed understanding, and voiced agreement with the above plans.  All questions were answered to her satisfaction. she is encouraged to contact clinic should she have any questions or concerns prior to her return visit.     Follow up plan: - Return in about 2 weeks (around 01/23/2021) for Diabetes F/U, Bring meter and logs.    Rayetta Pigg, [facility] [address] [place], [place] [address] Phone: [phone] Fax: [phone]  01/09/2021, 10:50 AM

## 2021-01-12 ENCOUNTER — Ambulatory Visit (INDEPENDENT_AMBULATORY_CARE_PROVIDER_SITE_OTHER): Payer: Medicare HMO | Admitting: Podiatry

## 2021-01-12 ENCOUNTER — Other Ambulatory Visit: Payer: Self-pay

## 2021-01-12 DIAGNOSIS — L97512 Non-pressure chronic ulcer of other part of right foot with fat layer exposed: Secondary | ICD-10-CM

## 2021-01-12 DIAGNOSIS — E0843 Diabetes mellitus due to underlying condition with diabetic autonomic (poly)neuropathy: Secondary | ICD-10-CM

## 2021-01-12 NOTE — Progress Notes (Signed)
   Subjective:  55 y.o. female with PMHx of diabetes mellitus presenting for follow-up evaluation of the right great toe ulcer.  Patient sustained an injury on 12/21/2020 to the right hallux when she stubbed her toe.  She went to the urgent care and x-rays were taken negative for fracture.  She followed up here in the office on 12/24/2020 and the decision was made to perform a total temporary nail avulsion of the right hallux nail plate.  Patient presents for follow-up treatment and evaluation she has been applying antibiotic gentamicin cream daily and completed the oral Keflex as prescribed at the ED/urgent care.  She is also been weightbearing in the postsurgical shoe as instructed   Past Medical History:  Diagnosis Date   CAP (community acquired pneumonia)    Streptococcus 01/2011   COPD (chronic obstructive pulmonary disease) ([facility])    Coronary atherosclerosis of native coronary artery    Diagnosed [place] 2006 - DES RCA, reports followup cath 2009 at [facility]    Dyslipidemia    Essential hypertension, benign    Morbid obesity ([facility])    Pancreatitis    Type 2 diabetes mellitus ([facility])       Objective/Physical Exam General: The patient is alert and oriented x3 in no acute distress.  Dermatology:  Wound #1 noted to the right great toe distal tip measuring approximately 1.0 x 1.0 x 0.1 cm (LxWxD).   To the noted ulceration(s), there is no eschar. There is a moderate amount of slough, fibrin, and necrotic tissue noted. Granulation tissue and wound base is red. There is a minimal amount of serosanguineous drainage noted. There is no exposed bone muscle-tendon ligament or joint. There is no malodor. Periwound integrity is intact. Skin is warm, dry and supple bilateral lower extremities.  Vascular: Palpable pedal pulses right.  No edema or erythema noted  Neurological: Epicritic and protective threshold diminished  Musculoskeletal Exam: H/o BKA LLE.  01/2020.  Assessment: 1.  Ulcer right  great toe secondary to diabetes mellitus 2. diabetes mellitus w/ peripheral neuropathy; uncontrolled   Plan of Care:  1. Patient was evaluated. 2. medically necessary excisional debridement including subcutaneous tissue was performed using a tissue nipper and a chisel blade. Excisional debridement of all the necrotic nonviable tissue down to healthy bleeding viable tissue was performed with post-debridement measurements same as pre-. 3. the wound was cleansed and dry sterile dressing applied. 4.  Continue gentamicin cream daily 5.  Continue daily Epsom salt soaks 6.  Continue weightbearing in the postsurgical shoe 7.  Return to clinic in 3 weeks  Felecia Shelling, DPM [facility]  Dr. Felecia Shelling, DPM    [address] [place], [place] [address]                Office [phone]  Fax [phone]

## 2021-01-23 ENCOUNTER — Ambulatory Visit (INDEPENDENT_AMBULATORY_CARE_PROVIDER_SITE_OTHER): Payer: Medicare HMO | Admitting: Nurse Practitioner

## 2021-01-23 ENCOUNTER — Encounter: Payer: Self-pay | Admitting: Nurse Practitioner

## 2021-01-23 ENCOUNTER — Other Ambulatory Visit: Payer: Self-pay

## 2021-01-23 ENCOUNTER — Encounter: Payer: Self-pay | Admitting: "Endocrinology

## 2021-01-23 VITALS — BP 121/68 | HR 87 | Ht 64.0 in

## 2021-01-23 DIAGNOSIS — Z794 Long term (current) use of insulin: Secondary | ICD-10-CM | POA: Diagnosis not present

## 2021-01-23 DIAGNOSIS — E1165 Type 2 diabetes mellitus with hyperglycemia: Secondary | ICD-10-CM | POA: Diagnosis not present

## 2021-01-23 DIAGNOSIS — E782 Mixed hyperlipidemia: Secondary | ICD-10-CM | POA: Diagnosis not present

## 2021-01-23 DIAGNOSIS — I10 Essential (primary) hypertension: Secondary | ICD-10-CM | POA: Diagnosis not present

## 2021-01-23 MED ORDER — HUMULIN R U-500 KWIKPEN 500 UNIT/ML ~~LOC~~ SOPN: 80.0000 [IU] | PEN_INJECTOR | Freq: Three times a day (TID) | SUBCUTANEOUS | 3 refills | Status: DC

## 2021-01-23 NOTE — Progress Notes (Signed)
Endocrinology Follow Up Note       01/23/2021, 10:32 AM   Subjective:    Patient ID: Nicole [name], female    DOB: 04-08-1965.  Nicole [name] is being seen in follow up after being seen in consultation for management of currently uncontrolled symptomatic diabetes requested by  [name], NP.   Past Medical History:  Diagnosis Date   CAP (community acquired pneumonia)    Streptococcus 01/2011   COPD (chronic obstructive pulmonary disease) ([place])    Coronary atherosclerosis of native coronary artery    Diagnosed [place] 2006 - DES RCA, reports followup cath 2009 at [facility]    Dyslipidemia    Essential hypertension, benign    Morbid obesity ([place])    Pancreatitis    Type 2 diabetes mellitus ([place])     Past Surgical History:  Procedure Laterality Date   ABDOMINAL HERNIA REPAIR     ABDOMINAL HYSTERECTOMY     AMPUTATION Left 01/25/2020   Procedure: LEFT BELOW KNEE AMPUTATION;  Surgeon: Newt Minion, MD;  Location: [facility];  Service: Orthopedics;  Laterality: Left;   CORONARY ANGIOPLASTY WITH STENT PLACEMENT  2006   KNEE SURGERY     NOSE SURGERY     Pilonidal cystectomy     TONSILLECTOMY      Social History   Socioeconomic History   Marital status: Divorced    Spouse name: Not on file   Number of children: 3   Years of education: Not on file   Highest education level: Not on file  Occupational History   Occupation: Home health aide    Employer: [employer]  Tobacco Use   Smoking status: Former    Packs/day: 0.50    Years: 20.00    Pack years: 10.00    Types: Cigarettes    Quit date: 01/22/2020    Years since quitting: 1.0   Smokeless tobacco: Never  Vaping Use   Vaping Use: Never used  Substance and Sexual Activity   Alcohol use: No   Drug use: No   Sexual activity: Yes    Birth control/protection: Surgical  Other Topics Concern   Not on file  Social History  Narrative   Not on file   Social Determinants of Health   Financial Resource Strain: Not on file  Food Insecurity: Not on file  Transportation Needs: Not on file  Physical Activity: Not on file  Stress: Not on file  Social Connections: Not on file    Family History  Problem Relation Age of Onset   Diabetes Mother    Heart failure Mother    Hypertension Mother    Kidney failure Mother    Ovarian cancer Mother    Asthma Son     Outpatient Encounter Medications as of 01/23/2021  Medication Sig   insulin regular human CONCENTRATED (HUMULIN R U-500 KWIKPEN) 500 UNIT/ML KwikPen Inject 80 Units into the skin 3 (three) times daily with meals.   acetaminophen (TYLENOL) 500 MG tablet Take 1 tablet (500 mg total) by mouth every 6 (six) hours as needed for moderate pain.   albuterol (PROVENTIL) (2.5 MG/3ML) 0.083% nebulizer solution Take 3 mLs (2.5 mg total)  by nebulization every 6 (six) hours as needed for wheezing.   albuterol (VENTOLIN HFA) 108 (90 Base) MCG/ACT inhaler Inhale 2 puffs into the lungs every 6 (six) hours as needed for wheezing. Shortness of breath   BINAXNOW COVID-19 AG HOME TEST KIT See admin instructions.   blood glucose meter kit and supplies Dispense based on patient and insurance preference. Use four times daily as directed. (FOR ICD-10 E10.9, E11.9).   buPROPion ER (WELLBUTRIN SR) 100 MG 12 hr tablet Take 1 tablet (100 mg total) by mouth 2 (two) times daily.   celecoxib (CELEBREX) 200 MG capsule Take 1 capsule (200 mg total) by mouth 2 (two) times daily.   Cholecalciferol 25 MCG (1000 UT) capsule Take 1,000 Units by mouth daily.   clopidogrel (PLAVIX) 75 MG tablet Take 1 tablet (75 mg total) by mouth daily.   docusate sodium (COLACE) 100 MG capsule Take 1 capsule (100 mg total) by mouth 2 (two) times daily as needed for mild constipation.   DULoxetine (CYMBALTA) 60 MG capsule Take 1 capsule (60 mg total) by mouth daily.   EASY TOUCH INSULIN SYRINGE 31G X 5/16" 1 ML  MISC    EPINEPHrine 0.3 mg/0.3 mL IJ SOAJ injection Inject 0.3 mg into the muscle as needed for anaphylaxis.   furosemide (LASIX) 80 MG tablet Take 1 tablet (80 mg total) by mouth daily.   gentamicin cream (GARAMYCIN) 0.1 % Apply 1 application topically 2 (two) times daily.   glipiZIDE (GLUCOTROL) 10 MG tablet Take 1 tablet (10 mg total) by mouth in the morning and at bedtime.   Insulin Pen Needle (PEN NEEDLES) 31G X 8 MM MISC UAD   lisinopril (ZESTRIL) 2.5 MG tablet Take 1 tablet (2.5 mg total) by mouth daily.   loperamide (IMODIUM) 2 MG capsule Take 1 capsule (2 mg total) by mouth every 6 (six) hours as needed for diarrhea or loose stools.   metoprolol tartrate (LOPRESSOR) 50 MG tablet Take 1 tablet (50 mg total) by mouth 2 (two) times daily.   Multiple Vitamins-Calcium (ONE-A-DAY WOMENS FORMULA) TABS Take 1 tablet by mouth daily.   mupirocin ointment (BACTROBAN) 2 % Apply topically daily.   nitroGLYCERIN (NITROSTAT) 0.4 MG SL tablet Take one tablet every 5 minutes as needed for chest pains up to 3 tablets.   pantoprazole (PROTONIX) 40 MG tablet Take 1 tablet (40 mg total) by mouth daily.   senna-docusate (SENOKOT-S) 8.6-50 MG tablet Take 1 tablet by mouth at bedtime as needed for mild constipation.   simvastatin (ZOCOR) 80 MG tablet Take 1 tablet (80 mg total) by mouth daily.   TRULICITY 1.5 LD/3.5TS SOPN Inject 1.5 mg into the skin once a week.   [DISCONTINUED] insulin aspart (NOVOLOG FLEXPEN) 100 UNIT/ML FlexPen Inject 40-46 Units into the skin 3 (three) times daily with meals. Inject 60 units into the skin three times a day with meals.   [DISCONTINUED] insulin detemir (LEVEMIR FLEXTOUCH) 100 UNIT/ML FlexPen Inject 100 Units into the skin 2 (two) times daily.   No facility-administered encounter medications on file as of 01/23/2021.    ALLERGIES: Allergies  Allergen Reactions   Aspirin Anaphylaxis and Other (See Comments)    Throat closing    Bee Pollen Anaphylaxis   Bee Venom  Anaphylaxis   Food Anaphylaxis    PINEAPPLE   Pineapple Anaphylaxis   E-Mycin [Erythromycin Base] Nausea And Vomiting   Other Rash    VACCINATION STATUS: Immunization History  Administered Date(s) Administered   Influenza,inj,Quad PF,6+ Mos 12/25/2019, 12/23/2020  Moderna Sars-Covid-2 Vaccination 06/25/2019, 07/25/2019   Pneumococcal Polysaccharide-23 04/11/2012, 12/25/2019    Diabetes She presents for her follow-up diabetic visit. She has type 2 diabetes mellitus. Onset time: diagnosed at approx age of 40. Her disease course has been fluctuating. There are no hypoglycemic associated symptoms. Associated symptoms include blurred vision, fatigue, foot paresthesias, polydipsia and polyuria. There are no hypoglycemic complications. Symptoms are stable. Diabetic complications include heart disease, peripheral neuropathy and PVD. (Hx Left BKA, MI) Risk factors for coronary artery disease include diabetes mellitus, dyslipidemia, family history, obesity, hypertension and sedentary lifestyle. Current diabetic treatment includes intensive insulin program and oral agent (monotherapy) (and Trulicity 1.5). She is compliant with treatment most of the time. Her weight is fluctuating dramatically. She is following a generally unhealthy diet. When asked about meal planning, she reported none. She has not had a previous visit with a dietitian. She rarely participates in exercise. Her home blood glucose trend is fluctuating dramatically. Her breakfast blood glucose range is generally >200 mg/dl. Her lunch blood glucose range is generally >200 mg/dl. Her dinner blood glucose range is generally >200 mg/dl. Her bedtime blood glucose range is generally >200 mg/dl. (She presents today with her meter and logs showing initially improved glycemic profile but has since worsened yet again.  She admits she still consumes soda but has cut back.  She also admits she has been stress eating lately as well.  She denies any  hypoglycemia.  I did note that she has been taking short-acting insulin at bedtime to help control her high numbers.  We discussed why this is unsafe today.) An ACE inhibitor/angiotensin II receptor blocker is being taken. She sees a podiatrist.Eye exam is current.  Hypertension This is a chronic problem. The current episode started more than 1 year ago. The problem has been gradually improving since onset. The problem is controlled. Associated symptoms include blurred vision. There are no associated agents to hypertension. Risk factors for coronary artery disease include diabetes mellitus, dyslipidemia, family history, obesity and sedentary lifestyle. Past treatments include ACE inhibitors, beta blockers and diuretics. The current treatment provides mild improvement. Compliance problems include diet and exercise.  Hypertensive end-organ damage includes CAD/MI, heart failure and PVD.  Hyperlipidemia This is a chronic problem. The current episode started more than 1 year ago. The problem is uncontrolled. Recent lipid tests were reviewed and are variable. Exacerbating diseases include diabetes and obesity. Factors aggravating her hyperlipidemia include beta blockers. Current antihyperlipidemic treatment includes statins. The current treatment provides mild improvement of lipids. Compliance problems include adherence to diet and adherence to exercise.  Risk factors for coronary artery disease include diabetes mellitus, dyslipidemia, family history, obesity, hypertension and a sedentary lifestyle.    Review of systems  Constitutional: + Minimally fluctuating body weight, current Body mass index is 54.76 kg/m., + fatigue, no subjective hyperthermia, no subjective hypothermia Eyes: + blurry vision, no xerophthalmia ENT: no sore throat, no nodules palpated in throat, no dysphagia/odynophagia, no hoarseness Cardiovascular: no chest pain, no shortness of breath, no palpitations, + leg swelling Respiratory: no  cough, + shortness of breath- hx COPD on chronic O2 Gastrointestinal: no nausea/vomiting/diarrhea Musculoskeletal: no muscle/joint aches, WC bound due to hx of L BKA Skin: no rashes, no hyperemia Neurological: no tremors, no numbness, no tingling, no dizziness Psychiatric: no depression, no anxiety  Objective:     BP 121/68   Pulse 87   Ht _0  (1.626 m)   BMI 54.76 kg/m   Wt Readings from Last 3 Encounters:  01/09/21 (!) 319 lb (144.7 kg)  12/21/20 (!) 301 lb 5.9 oz (136.7 kg)  10/31/20 (!) 312 lb (141.5 kg)     BP Readings from Last 3 Encounters:  01/23/21 121/68  01/09/21 (!) 157/81  12/23/20 (!) 158/81     Physical Exam- Limited  Constitutional:  Body mass index is 54.76 kg/m. , not in acute distress, normal state of mind Eyes:  EOMI, no exophthalmos Neck: Supple Cardiovascular: RRR, no murmurs, rubs, or gallops,  Respiratory: Adequate breathing efforts, no crackles, rales, rhonchi, or wheezing, chronic O2 Musculoskeletal: WC bound- hx L BKA Skin:  no rashes, no hyperemia Neurological: no tremor with outstretched hands    CMP ( most recent) CMP     Component Value Date/Time   NA 136 10/31/2020 1059   K 5.0 10/31/2020 1059   CL 97 (L) 10/31/2020 1059   CO2 29 10/31/2020 1059   GLUCOSE 444 (H) 10/31/2020 1059   BUN 23 (H) 10/31/2020 1059   CREATININE 0.90 10/31/2020 1059   CREATININE 0.86 07/30/2020 1558   CALCIUM 9.2 10/31/2020 1059   PROT 7.2 10/31/2020 1059   ALBUMIN 3.5 10/31/2020 1059   AST 31 10/31/2020 1059   ALT 20 10/31/2020 1059   ALKPHOS 121 10/31/2020 1059   BILITOT 0.4 10/31/2020 1059   GFRNONAA >60 10/31/2020 1059   GFRNONAA 76 07/30/2020 1558   GFRAA 88 07/30/2020 1558     Diabetic Labs (most recent): Lab Results  Component Value Date   HGBA1C 12.4 (A) 12/23/2020   HGBA1C 9.1 (H) 05/15/2020   HGBA1C 9.6 (H) 12/25/2019     Lipid Panel ( most recent) Lipid Panel     Component Value Date/Time   CHOL 117 12/25/2019 1359    TRIG 208 (H) 12/25/2019 1359   HDL 25 (L) 12/25/2019 1359   CHOLHDL 4.7 12/25/2019 1359   VLDL 51 (H) 04/11/2012 0253   LDLCALC 65 12/25/2019 1359      Lab Results  Component Value Date   TSH 2.06 12/25/2019   TSH 2.800 04/11/2012   TSH 4.814 (H) 11/10/2011   TSH 2.672 02/19/2011   FREET4 1.2 12/25/2019           Assessment & Plan:   1) Type 2 diabetes mellitus with hyperglycemia, with long-term current use of insulin ([place])  She presents today with her meter and logs showing initially improved glycemic profile but has since worsened yet again.  She admits she still consumes soda but has cut back.  She also admits she has been stress eating lately as well.  She denies any hypoglycemia.  I did note that she has been taking short-acting insulin at bedtime to help control her high numbers.  We discussed why this is unsafe today.  - Nicole [name] has currently uncontrolled symptomatic type 2 DM since 55 years of age, with most recent A1c of 12.4 %.   -Recent labs reviewed.  - I had a long discussion with her about the progressive nature of diabetes and the pathology behind its complications. -her diabetes is complicated by PVD- L BKA, MI, CAD, CHF and she remains at a high risk for more acute and chronic complications which include CAD, CVA, CKD, retinopathy, and neuropathy. These are all discussed in detail with her.  - Nutritional counseling repeated at each appointment due to patients tendency to fall back in to old habits.  - The patient admits there is a room for improvement in their diet and drink choices. -  Suggestion is  made for the patient to avoid simple carbohydrates from their diet including Cakes, Sweet Desserts / Pastries, Ice Cream, Soda (diet and regular), Sweet Tea, Candies, Chips, Cookies, Sweet Pastries, Store Bought Juices, Alcohol in Excess of 1-2 drinks a day, Artificial Sweeteners, Coffee Creamer, and "Sugar-free" Products. This will help patient to have  stable blood glucose profile and potentially avoid unintended weight gain.   - I encouraged the patient to switch to unprocessed or minimally processed complex starch and increased protein intake (animal or plant source), fruits, and vegetables.   - Patient is advised to stick to a routine mealtimes to eat 3 meals a day and avoid unnecessary snacks (to snack only to correct hypoglycemia).  - she will be scheduled with Jearld Fenton, RDN, CDE for diabetes education.  - I have approached her with the following individualized plan to manage her diabetes and patient agrees:   -She is on very large quantity of insulin (over 200 units per day).  It is likely she will need more concentrated insulin to maximize absorption- we discussed this today and since she is nearly out of Novolog, we decided to make the switch today to maximize absorption and thus increase effectiveness.  -I discussed and initiated Humulin R- U500 80 units TID with meals if glucose is above 90 and she is eating.  This will take place of her Levemir/Novolog combo.  She can continue her Trulicity 1.5 mg SQ weekly and Glipizide 10 mg po twice daily with meals for now.  -she is encouraged to continue monitoring glucose 4 times daily, before meals and before bed, and to call the clinic if she has readings less than 70 or above 300 for 3 tests in a row.  She could greatly benefit from a CGM device.  I will order through [employer] today.  - she is warned not to take insulin without proper monitoring per orders. - Adjustment parameters are given to her for hypo and hyperglycemia in writing.  - Specific targets for  A1c; LDL, HDL, and Triglycerides were discussed with the patient.  2) Blood Pressure /Hypertension:  her blood pressure is controlled to target.   she is advised to continue her current medications including Lasix 80 mg po daily, Lisinopril 2.5 mg p.o. daily with breakfast, and Metoprolol 50 mg po twice daily.  3)  Lipids/Hyperlipidemia:    Review of her recent lipid panel from 12/25/19 showed controlled LDL at 65 and elevated triglycerides of 208 .  she is advised to continue Simvastatin 80 mg daily at bedtime.  Side effects and precautions discussed with her.  She is also advised to avoid fried foods and butter.  4)  Weight/Diet:  her Body mass index is 54.76 kg/m.  -  clearly complicating her diabetes care.   she is a candidate for weight loss. I discussed with her the fact that loss of 5 - 10% of her  current body weight will have the most impact on her diabetes management.  Exercise, and detailed carbohydrates information provided  -  detailed on discharge instructions.  5) Chronic Care/Health Maintenance: -she is on ACEI/ARB and Statin medications and is encouraged to initiate and continue to follow up with Ophthalmology, Dentist, Podiatrist at least yearly or according to recommendations, and advised to stay away from smoking. I have recommended yearly flu vaccine and pneumonia vaccine at least every 5 years; moderate intensity exercise for up to 150 minutes weekly; and sleep for at least 7 hours a day.  - she is  advised to maintain close follow up with [name], NP for primary care needs, as well as her other providers for optimal and coordinated care.     I spent 40 minutes in the care of the patient today including review of labs from [facility], Lipids, Thyroid Function, Hematology (current and previous including abstractions from other facilities); face-to-face time discussing  her blood glucose readings/logs, discussing hypoglycemia and hyperglycemia episodes and symptoms, medications doses, her options of short and long term treatment based on the latest standards of care / guidelines;  discussion about incorporating lifestyle medicine;  and documenting the encounter.    Please refer to Patient Instructions for Blood Glucose Monitoring and Insulin/Medications Dosing Guide"  in media tab for  additional information. Please  also refer to " Patient Self Inventory" in the Media  tab for reviewed elements of pertinent patient history.  Nicole [name] participated in the discussions, expressed understanding, and voiced agreement with the above plans.  All questions were answered to her satisfaction. she is encouraged to contact clinic should she have any questions or concerns prior to her return visit.     Follow up plan: - Return in about 1 month (around 02/23/2021) for Diabetes F/U, Bring meter and logs.   Rayetta Pigg, [facility] [address] [place], [place] [address] Phone: [phone] Fax: [phone]  01/23/2021, 10:32 AM

## 2021-01-23 NOTE — Patient Instructions (Signed)

## 2021-02-02 ENCOUNTER — Ambulatory Visit: Payer: Medicare HMO | Admitting: Podiatry

## 2021-02-19 ENCOUNTER — Institutional Professional Consult (permissible substitution): Payer: Medicare HMO | Admitting: Internal Medicine

## 2021-02-19 NOTE — Progress Notes (Deleted)
°  Subjective:    Patient ID: Nicole [name], female    DOB: 12/15/1965,MRN: [id number]  HPI  53 yowf quit smoking 2006 required neb maybe twice a week at wt around 200-210 referred 05/25/2012  To pulmonary clinic by  [facility] for eval of refractory copd symptoms with pfts in 2013 showing minimal obst, mostly restriction with ERV 3% at wt 334   05/25/2012 1st pulmonary eval on ACEi with harsh cough assoc with hoarseness acutely worse since Dec 2013 assoc. Sleeps in reclined position 60 degrees. Doe x 50 ft at baseline. No better on multiple inhalers. Rec Stop lisinopril and all inhalers and just nebulizer every 4 hours if needed Start benicar 40 mg one daily ( one half daily if light headed standing) Pantoprazole (protonix) 40 mg   Take 30-60 min before first meal of the day and Pepcid 20 mg one bedtime until return to office  Please schedule a follow up office visit in 4 weeks, sooner if needed PFTs on return> did not do    02/19/2021  re-establish ov/[place] office/Sabella Traore re: *** maint on ***  No chief complaint on file.   Dyspnea:  *** Cough: *** Sleeping: *** SABA use: *** 02: *** Covid status: *** Lung cancer screening: ***   No obvious day to day or daytime variability or assoc excess/ purulent sputum or mucus plugs or hemoptysis or cp or chest tightness, subjective wheeze or overt sinus or hb symptoms.   *** without nocturnal  or early am exacerbation  of respiratory  c/o's or need for noct saba. Also denies any obvious fluctuation of symptoms with weather or environmental changes or other aggravating or alleviating factors except as outlined above   No unusual exposure hx or h/o childhood pna/ asthma or knowledge of premature birth.  Current Allergies, Complete Past Medical History, Past Surgical History, Family History, and Social History were reviewed in [employer] record.  ROS  The following are not active complaints unless bolded Hoarseness, sore throat,  dysphagia, dental problems, itching, sneezing,  nasal congestion or discharge of excess mucus or purulent secretions, ear ache,   fever, chills, sweats, unintended wt loss or wt gain, classically pleuritic or exertional cp,  orthopnea pnd or arm/hand swelling  or leg swelling, presyncope, palpitations, abdominal pain, anorexia, nausea, vomiting, diarrhea  or change in bowel habits or change in bladder habits, change in stools or change in urine, dysuria, hematuria,  rash, arthralgias, visual complaints, headache, numbness, weakness or ataxia or problems with walking or coordination,  change in mood or  memory.        No outpatient medications have been marked as taking for the 02/19/21 encounter (Appointment) with Nyoka Cowden, MD.              Objective:   Physical Exam  Wts  02/19/2021      ***  05/25/12 339 lb 6.4 oz (153.951 kg)  04/11/12 335 lb 5.1 oz (152.1 kg)  02/20/12 310 lb (140.615 kg)              Assessment & Plan:

## 2021-02-25 ENCOUNTER — Ambulatory Visit: Payer: Medicare HMO | Admitting: Nurse Practitioner

## 2021-03-04 ENCOUNTER — Ambulatory Visit: Payer: Medicare HMO | Admitting: Nurse Practitioner

## 2021-03-11 ENCOUNTER — Other Ambulatory Visit: Payer: Self-pay | Admitting: Nurse Practitioner

## 2021-03-11 ENCOUNTER — Other Ambulatory Visit (INDEPENDENT_AMBULATORY_CARE_PROVIDER_SITE_OTHER): Payer: Self-pay | Admitting: Nurse Practitioner

## 2021-03-11 DIAGNOSIS — F32A Depression, unspecified: Secondary | ICD-10-CM

## 2021-03-16 ENCOUNTER — Ambulatory Visit: Payer: Commercial Managed Care - HMO | Admitting: Podiatry

## 2021-03-17 ENCOUNTER — Ambulatory Visit: Payer: Medicaid Other | Admitting: Nurse Practitioner

## 2021-03-17 NOTE — Patient Instructions (Incomplete)

## 2021-03-25 DIAGNOSIS — Z89512 Acquired absence of left leg below knee: Secondary | ICD-10-CM | POA: Diagnosis not present

## 2021-03-25 NOTE — Progress Notes (Signed)
Erroneous encounter

## 2021-03-30 ENCOUNTER — Encounter: Payer: Medicare HMO | Admitting: Family

## 2021-03-30 DIAGNOSIS — D72828 Other elevated white blood cell count: Secondary | ICD-10-CM

## 2021-03-30 DIAGNOSIS — Z124 Encounter for screening for malignant neoplasm of cervix: Secondary | ICD-10-CM

## 2021-03-30 DIAGNOSIS — E785 Hyperlipidemia, unspecified: Secondary | ICD-10-CM

## 2021-03-30 DIAGNOSIS — E119 Type 2 diabetes mellitus without complications: Secondary | ICD-10-CM

## 2021-03-30 DIAGNOSIS — F419 Anxiety disorder, unspecified: Secondary | ICD-10-CM

## 2021-03-30 DIAGNOSIS — Z113 Encounter for screening for infections with a predominantly sexual mode of transmission: Secondary | ICD-10-CM

## 2021-03-30 DIAGNOSIS — Z1211 Encounter for screening for malignant neoplasm of colon: Secondary | ICD-10-CM

## 2021-03-30 DIAGNOSIS — Z1159 Encounter for screening for other viral diseases: Secondary | ICD-10-CM

## 2021-03-30 DIAGNOSIS — Z1329 Encounter for screening for other suspected endocrine disorder: Secondary | ICD-10-CM

## 2021-03-30 DIAGNOSIS — Z0000 Encounter for general adult medical examination without abnormal findings: Secondary | ICD-10-CM

## 2021-04-01 DIAGNOSIS — G4733 Obstructive sleep apnea (adult) (pediatric): Secondary | ICD-10-CM | POA: Diagnosis not present

## 2021-04-02 DIAGNOSIS — M1990 Unspecified osteoarthritis, unspecified site: Secondary | ICD-10-CM | POA: Diagnosis not present

## 2021-04-02 DIAGNOSIS — J449 Chronic obstructive pulmonary disease, unspecified: Secondary | ICD-10-CM | POA: Diagnosis not present

## 2021-04-02 DIAGNOSIS — K219 Gastro-esophageal reflux disease without esophagitis: Secondary | ICD-10-CM | POA: Diagnosis not present

## 2021-04-02 DIAGNOSIS — E559 Vitamin D deficiency, unspecified: Secondary | ICD-10-CM | POA: Diagnosis not present

## 2021-04-02 DIAGNOSIS — M6281 Muscle weakness (generalized): Secondary | ICD-10-CM | POA: Diagnosis not present

## 2021-04-02 DIAGNOSIS — R195 Other fecal abnormalities: Secondary | ICD-10-CM | POA: Diagnosis not present

## 2021-04-02 DIAGNOSIS — I4891 Unspecified atrial fibrillation: Secondary | ICD-10-CM | POA: Diagnosis not present

## 2021-04-02 DIAGNOSIS — E1169 Type 2 diabetes mellitus with other specified complication: Secondary | ICD-10-CM | POA: Diagnosis not present

## 2021-04-02 DIAGNOSIS — I2510 Atherosclerotic heart disease of native coronary artery without angina pectoris: Secondary | ICD-10-CM | POA: Diagnosis not present

## 2021-04-02 DIAGNOSIS — E785 Hyperlipidemia, unspecified: Secondary | ICD-10-CM | POA: Insufficient documentation

## 2021-04-02 DIAGNOSIS — Z8673 Personal history of transient ischemic attack (TIA), and cerebral infarction without residual deficits: Secondary | ICD-10-CM | POA: Diagnosis not present

## 2021-04-02 DIAGNOSIS — R5383 Other fatigue: Secondary | ICD-10-CM | POA: Diagnosis not present

## 2021-04-02 DIAGNOSIS — I509 Heart failure, unspecified: Secondary | ICD-10-CM | POA: Diagnosis not present

## 2021-04-03 ENCOUNTER — Ambulatory Visit: Payer: Self-pay

## 2021-04-03 ENCOUNTER — Other Ambulatory Visit: Payer: Self-pay

## 2021-04-03 ENCOUNTER — Ambulatory Visit (INDEPENDENT_AMBULATORY_CARE_PROVIDER_SITE_OTHER): Payer: Medicare Other | Admitting: Family

## 2021-04-03 DIAGNOSIS — Z89512 Acquired absence of left leg below knee: Secondary | ICD-10-CM

## 2021-04-03 DIAGNOSIS — S88119A Complete traumatic amputation at level between knee and ankle, unspecified lower leg, initial encounter: Secondary | ICD-10-CM

## 2021-04-03 NOTE — Progress Notes (Signed)
Office Visit Note   Patient: Nicole [name]           Date of Birth: 1965-08-11           MRN: [id number] Visit Date: 04/03/2021              Requested by: [name], NP [address] [place],  [place] [address] PCP: [name], NP  Chief Complaint  Patient presents with   Left Leg - Follow-up    Hx left BKA      HPI: The patient is a 56 year old woman seen today for evaluation of her left residual limb.  She is status post left below-knee amputation in December 2021.  She has had a prosthetic for about a year now but states she cannot use this due to chronic edema.  She also has significant pain when she ambulates in her prosthesis.  Complaining of significant pain and tenderness with associated swelling on the lateral aspect of her residual limb.  She has had many falls  Assessment & Plan: Visit Diagnoses:  1. Below knee amputation ([place])     Plan: Recommended she double up with her shrinker's and wear these around-the-clock.  She will be wearing her medical compression shrinker and then the ring shrinker on top.  She will proceed with physical therapy  Follow-Up Instructions: No follow-ups on file.   Ortho Exam  Patient is alert, oriented, no adenopathy, well-dressed, normal affect, normal respiratory effort. Examination of the left residual limb her limb is well healed there is no erythema she does have some mild edema significant tenderness laterally of the fibula.  There is no ecchymosis no palpable fluctuance  Imaging: No results found. No images are attached to the encounter.  Labs: Lab Results  Component Value Date   HGBA1C 12.4 (A) 12/23/2020   HGBA1C 9.1 (H) 05/15/2020   HGBA1C 9.6 (H) 12/25/2019   ESRSEDRATE 67 (H) 12/05/2019   CRP 8.7 (H) 12/05/2019   REPTSTATUS 01/27/2020 FINAL 01/22/2020   GRAMSTAIN  02/19/2011    ABUNDANT WBC PRESENT,BOTH PMN AND MONONUCLEAR RARE SQUAMOUS EPITHELIAL CELLS PRESENT FEW GRAM POSITIVE COCCI IN  PAIRS RARE GRAM NEGATIVE RODS RARE GRAM POSITIVE RODS   CULT  01/22/2020    NO GROWTH 5 DAYS Performed at [facility], [address], [place], [place] [address]      Lab Results  Component Value Date   ALBUMIN 3.5 10/31/2020   ALBUMIN 2.5 (L) 01/23/2020   ALBUMIN 2.5 (L) 01/22/2020   PREALBUMIN 17.9 (L) 12/05/2019    Lab Results  Component Value Date   MG 1.6 (L) 02/04/2020   MG 1.6 (L) 02/01/2020   MG 1.6 (L) 01/23/2020   Lab Results  Component Value Date   VD25OH 23 (L) 07/30/2020    Lab Results  Component Value Date   PREALBUMIN 17.9 (L) 12/05/2019   CBC EXTENDED Latest Ref Rng & Units 10/31/2020 07/30/2020 01/29/2020  WBC 4.0 - 10.5 K/uL 11.6(H) 12.5(H) 14.2(H)  RBC 3.87 - 5.11 MIL/uL 4.47 4.51 3.54(L)  HGB 12.0 - 15.0 g/dL 13.6 12.8 10.3(L)  HCT 36.0 - 46.0 % 42.4 41.2 34.0(L)  PLT 150 - 400 K/uL 222 221 347  NEUTROABS 1.7 - 7.7 K/uL 7.7 - 9.7(H)  LYMPHSABS 0.7 - 4.0 K/uL 2.6 - 2.9     There is no height or weight on file to calculate BMI.  Orders:  Orders Placed This Encounter  Procedures   XR Tibia/Fibula Left   No  orders of the defined types were placed in this encounter.    Procedures: No procedures performed  Clinical Data: No additional findings.  ROS:  All other systems negative, except as noted in the HPI. Review of Systems  Objective: Vital Signs: There were no vitals taken for this visit.  Specialty Comments:  No specialty comments available.  PMFS History: Patient Active Problem List   Diagnosis Date Noted   Hx of BKA, left (HCC) 09/09/2020   Vitamin D deficiency 07/30/2020   Chronic respiratory failure with hypoxia (HCC) 07/30/2020   Anxiety and depression 07/30/2020   Atrial fibrillation (HCC) 07/30/2020   Acute osteomyelitis of toe, left (HCC)    Sepsis due to Lt foot osteo 01/23/2020   Morbid obesity with body mass index (BMI) of 50.0 to 59.9 in adult (HCC) 01/23/2020   Tobacco abuse--2 P/day Smoker 01/23/2020    Cellulitis of left foot 01/22/2020   Osteomyelitis of left foot (HCC) 01/22/2020   Leukocytosis 01/22/2020   Hypokalemia 01/22/2020   Hypoalbuminemia 01/22/2020   Hyperglycemia due to diabetes mellitus (HCC) 01/22/2020   Elevated alpha fetoprotein 01/22/2020   SIRS (systemic inflammatory response syndrome) (HCC) 01/22/2020   Diabetic ulcer of left foot (HCC) 12/05/2019   Cough 05/26/2012   TIA (transient ischemic attack) 04/10/2012   Acute diastolic congestive heart failure (HCC) 11/12/2011   COPD (chronic obstructive pulmonary disease) (HCC) 11/09/2011   Dyspnea 06/10/2011   CAP (community acquired pneumonia) 02/19/2011   Coronary atherosclerosis of native coronary artery/Stent in 2006 02/19/2011   Dyslipidemia    Morbid obesity (HCC)    Diabetes mellitus type 2 in obese (HCC)    HTN (hypertension), benign    Past Medical History:  Diagnosis Date   CAP (community acquired pneumonia)    Streptococcus 01/2011   COPD (chronic obstructive pulmonary disease) (HCC)    Coronary atherosclerosis of native coronary artery    Diagnosed [place] 2006 - DES RCA, reports followup cath [facility] at [facility]    Dyslipidemia    Essential hypertension, benign    Morbid obesity (HCC)    Pancreatitis    Type 2 diabetes mellitus (HCC)     Family History  Problem Relation Age of Onset   Diabetes Mother    Heart failure Mother    Hypertension Mother    Kidney failure Mother    Ovarian cancer Mother    Asthma Son     Past Surgical History:  Procedure Laterality Date   ABDOMINAL HERNIA REPAIR     ABDOMINAL HYSTERECTOMY     AMPUTATION Left 01/25/2020   Procedure: LEFT BELOW KNEE AMPUTATION;  Surgeon: [name], MD;  Location: [facility] OR;  Service: Orthopedics;  Laterality: Left;   CORONARY ANGIOPLASTY WITH STENT PLACEMENT  2006   KNEE SURGERY     NOSE SURGERY     Pilonidal cystectomy     TONSILLECTOMY     Social History   Occupational History   Occupation: Home health aide    Employer:  [employer]  Tobacco Use   Smoking status: Former    Packs/day: 0.50    Years: 20.00    Pack years: 10.00    Types: Cigarettes    Quit date: 01/22/2020    Years since quitting: 1.1   Smokeless tobacco: Never  Vaping Use   Vaping Use: Never used  Substance and Sexual Activity   Alcohol use: No   Drug use: No   Sexual activity: Yes    Birth control/protection: Surgical

## 2021-04-08 ENCOUNTER — Encounter: Payer: Self-pay | Admitting: Family

## 2021-04-08 DIAGNOSIS — M86172 Other acute osteomyelitis, left ankle and foot: Secondary | ICD-10-CM | POA: Diagnosis not present

## 2021-04-08 DIAGNOSIS — A419 Sepsis, unspecified organism: Secondary | ICD-10-CM | POA: Diagnosis not present

## 2021-04-08 DIAGNOSIS — R531 Weakness: Secondary | ICD-10-CM | POA: Diagnosis not present

## 2021-04-15 DIAGNOSIS — E038 Other specified hypothyroidism: Secondary | ICD-10-CM | POA: Insufficient documentation

## 2021-04-16 ENCOUNTER — Other Ambulatory Visit (HOSPITAL_COMMUNITY): Payer: Self-pay | Admitting: Family Medicine

## 2021-04-16 DIAGNOSIS — I4891 Unspecified atrial fibrillation: Secondary | ICD-10-CM | POA: Diagnosis not present

## 2021-04-16 DIAGNOSIS — E785 Hyperlipidemia, unspecified: Secondary | ICD-10-CM | POA: Diagnosis not present

## 2021-04-16 DIAGNOSIS — E1169 Type 2 diabetes mellitus with other specified complication: Secondary | ICD-10-CM | POA: Diagnosis not present

## 2021-04-16 DIAGNOSIS — K219 Gastro-esophageal reflux disease without esophagitis: Secondary | ICD-10-CM | POA: Diagnosis not present

## 2021-04-16 DIAGNOSIS — R195 Other fecal abnormalities: Secondary | ICD-10-CM | POA: Diagnosis not present

## 2021-04-16 DIAGNOSIS — I25119 Atherosclerotic heart disease of native coronary artery with unspecified angina pectoris: Secondary | ICD-10-CM | POA: Diagnosis not present

## 2021-04-16 DIAGNOSIS — Z1231 Encounter for screening mammogram for malignant neoplasm of breast: Secondary | ICD-10-CM

## 2021-04-16 DIAGNOSIS — Z13820 Encounter for screening for osteoporosis: Secondary | ICD-10-CM

## 2021-04-16 DIAGNOSIS — M1990 Unspecified osteoarthritis, unspecified site: Secondary | ICD-10-CM | POA: Diagnosis not present

## 2021-04-16 DIAGNOSIS — E1122 Type 2 diabetes mellitus with diabetic chronic kidney disease: Secondary | ICD-10-CM | POA: Insufficient documentation

## 2021-04-16 DIAGNOSIS — E1165 Type 2 diabetes mellitus with hyperglycemia: Secondary | ICD-10-CM | POA: Diagnosis not present

## 2021-04-16 DIAGNOSIS — I509 Heart failure, unspecified: Secondary | ICD-10-CM | POA: Diagnosis not present

## 2021-04-16 DIAGNOSIS — Z8673 Personal history of transient ischemic attack (TIA), and cerebral infarction without residual deficits: Secondary | ICD-10-CM | POA: Diagnosis not present

## 2021-04-16 DIAGNOSIS — J449 Chronic obstructive pulmonary disease, unspecified: Secondary | ICD-10-CM | POA: Diagnosis not present

## 2021-04-20 ENCOUNTER — Telehealth: Payer: Self-pay | Admitting: Family

## 2021-04-20 NOTE — Telephone Encounter (Signed)
Pt called requesting a script for a new wheel chair. Pt states had wheelchair for 2 years and wheel are bending in. Please call pt about this matter at [phone].

## 2021-04-20 NOTE — Telephone Encounter (Signed)
Pending, will put in new order on parachute

## 2021-04-21 DIAGNOSIS — J449 Chronic obstructive pulmonary disease, unspecified: Secondary | ICD-10-CM | POA: Diagnosis not present

## 2021-04-21 NOTE — Telephone Encounter (Signed)
I SW pt, she would like for [place] to send in for standard w/c at [facility]. Letter/Rx written and will fax to them

## 2021-04-22 ENCOUNTER — Telehealth: Payer: Self-pay

## 2021-04-22 NOTE — Telephone Encounter (Signed)
Kim at [employer] called stating that patient recently received a wheelchair through [facility] and that's who patient should go through.  CB# [phone].  Please advise.  Thank you. ?

## 2021-04-23 DIAGNOSIS — J449 Chronic obstructive pulmonary disease, unspecified: Secondary | ICD-10-CM | POA: Diagnosis not present

## 2021-04-23 NOTE — Telephone Encounter (Signed)
Sending order to [employer]. They may not cover this w/c as it is only 57 years old, needs to be 5 years or greater.  ?

## 2021-04-24 ENCOUNTER — Ambulatory Visit (HOSPITAL_COMMUNITY): Payer: Medicare Other

## 2021-04-24 ENCOUNTER — Other Ambulatory Visit (HOSPITAL_COMMUNITY): Payer: Medicare Other

## 2021-04-28 ENCOUNTER — Telehealth: Payer: Self-pay | Admitting: Family

## 2021-04-28 NOTE — Telephone Encounter (Signed)
Patient called advised she has [facility] and Medicaid. [employer] is asking for an authorization for a wheelchair for her. Patient asked if there is any questions to please contact her. The number to contact patient is [phone]   ?

## 2021-04-30 ENCOUNTER — Ambulatory Visit (INDEPENDENT_AMBULATORY_CARE_PROVIDER_SITE_OTHER): Payer: Medicare HMO | Admitting: Gastroenterology

## 2021-05-14 DIAGNOSIS — Z9981 Dependence on supplemental oxygen: Secondary | ICD-10-CM | POA: Insufficient documentation

## 2021-05-14 DIAGNOSIS — R809 Proteinuria, unspecified: Secondary | ICD-10-CM | POA: Diagnosis not present

## 2021-05-14 DIAGNOSIS — Z742 Need for assistance at home and no other household member able to render care: Secondary | ICD-10-CM | POA: Diagnosis not present

## 2021-05-15 ENCOUNTER — Ambulatory Visit: Payer: Medicare Other | Admitting: Family

## 2021-05-19 DIAGNOSIS — J449 Chronic obstructive pulmonary disease, unspecified: Secondary | ICD-10-CM | POA: Diagnosis not present

## 2021-05-21 ENCOUNTER — Ambulatory Visit: Payer: Medicare Other | Admitting: Physician Assistant

## 2021-05-21 DIAGNOSIS — R32 Unspecified urinary incontinence: Secondary | ICD-10-CM | POA: Insufficient documentation

## 2021-05-22 ENCOUNTER — Ambulatory Visit (INDEPENDENT_AMBULATORY_CARE_PROVIDER_SITE_OTHER): Payer: Medicare Other | Admitting: Family

## 2021-05-22 DIAGNOSIS — Z89512 Acquired absence of left leg below knee: Secondary | ICD-10-CM

## 2021-05-22 DIAGNOSIS — S88119A Complete traumatic amputation at level between knee and ankle, unspecified lower leg, initial encounter: Secondary | ICD-10-CM

## 2021-05-24 DIAGNOSIS — J449 Chronic obstructive pulmonary disease, unspecified: Secondary | ICD-10-CM | POA: Diagnosis not present

## 2021-05-25 DIAGNOSIS — M6281 Muscle weakness (generalized): Secondary | ICD-10-CM | POA: Diagnosis not present

## 2021-05-26 ENCOUNTER — Encounter: Payer: Self-pay | Admitting: Family

## 2021-05-26 NOTE — Progress Notes (Addendum)
? ?Office Visit Note ?  ?Patient: Nicole [name]           ?Date of Birth: October 31, 1965           ?MRN: [id number] ?Visit Date: 05/22/2021 ?             ?Requested by: [name], NP ?[address] ?[place],  [place] [address] ?PCP: [name], NP ? ?Chief Complaint  ?Patient presents with  ? Left Leg - Follow-up  ?  Hx left BKA  ? ? ? ? ?HPI: ?The patient is a 56 year old woman who presents today complaining of ongoing pain to her left residual limb she is status post left below-knee amputation she states that her current prosthesis was made about 2 years ago she has had difficulty donning this for over a year now she states that she has returned to her prosthetists where she was instructed to consistently wear her shrinker and compression garments she was instructed on wearing 2 shrinker she wears her medical compression stocking with the ring shrinker on top she is quite frustrated that she is unable to comfortably wear her prosthesis she states her physical therapy has stopped.  She is also had several falls over the years on her residual limb ? ?She states that when she does wear the shrinkers consistently her swelling does not change ? ?Patient is an existing left transtibial  amputee. ? ?Patient's current comorbidities are not expected to impact the ability to function with the prescribed prosthesis. ?Patient verbally communicates a strong desire to use a prosthesis. ?Patient currently requires mobility aids to ambulate without a prosthesis.  Expects not to use mobility aids with a new prosthesis. ? ?Patient is a K2 level ambulator that will use a prosthesis to walk around their home and the community over low level environmental barriers.  ? ? ? ? ?Assessment & Plan: ?Visit Diagnoses: No diagnosis found. ? ?Plan: I believe she is working with Richardson Landry at [facility] we will send an order for new socket set up the patient will call for an appointment order faxed for new socket discussed the  importance of wearing her compression garments consistently ? ?Follow-Up Instructions: Return in about 4 weeks (around 06/19/2021).  ? ?Ortho Exam ? ?Patient is alert, oriented, no adenopathy, well-dressed, normal affect, normal respiratory effort. ?On examination of the left residual limb this is well-healed there is trace edema in her residual limb there is no erythema no point tenderness no ulcer no callus ? ?Imaging: ?No results found. ?No images are attached to the encounter. ? ?Labs: ?Lab Results  ?Component Value Date  ? HGBA1C 12.4 (A) 12/23/2020  ? HGBA1C 9.1 (H) 05/15/2020  ? HGBA1C 9.6 (H) 12/25/2019  ? ESRSEDRATE 67 (H) 12/05/2019  ? CRP 8.7 (H) 12/05/2019  ? REPTSTATUS 01/27/2020 FINAL 01/22/2020  ? GRAMSTAIN  02/19/2011  ?  ABUNDANT WBC PRESENT,BOTH PMN AND MONONUCLEAR ?RARE SQUAMOUS EPITHELIAL CELLS PRESENT ?FEW GRAM POSITIVE COCCI ?IN PAIRS RARE GRAM NEGATIVE RODS ?RARE GRAM POSITIVE RODS  ? CULT  01/22/2020  ?  NO GROWTH 5 DAYS ?Performed at [facility], [address], [place], [place] [address] ?  ? ? ? ?Lab Results  ?Component Value Date  ? ALBUMIN 3.5 10/31/2020  ? ALBUMIN 2.5 (L) 01/23/2020  ? ALBUMIN 2.5 (L) 01/22/2020  ? PREALBUMIN 17.9 (L) 12/05/2019  ? ? ?Lab Results  ?Component Value Date  ? MG 1.6 (L) 02/04/2020  ? MG 1.6 (L) 02/01/2020  ? MG  1.6 (L) 01/23/2020  ? ?Lab Results  ?Component Value Date  ? VD25OH 23 (L) 07/30/2020  ? ? ?Lab Results  ?Component Value Date  ? PREALBUMIN 17.9 (L) 12/05/2019  ? ? ?  Latest Ref Rng & Units 10/31/2020  ? 10:59 AM 07/30/2020  ?  3:58 PM 01/29/2020  ?  3:59 AM  ?CBC EXTENDED  ?WBC 4.0 - 10.5 K/uL 11.6   12.5   14.2    ?RBC 3.87 - 5.11 MIL/uL 4.47   4.51   3.54    ?Hemoglobin 12.0 - 15.0 g/dL 13.6   12.8   10.3    ?HCT 36.0 - 46.0 % 42.4   41.2   34.0    ?Platelets 150 - 400 K/uL 222   221   347    ?NEUT# 1.7 - 7.7 K/uL 7.7    9.7    ?Lymph# 0.7 - 4.0 K/uL 2.6    2.9    ? ? ? ?There is no height or weight on file to calculate BMI. ? ?Orders:  ?No orders of  the defined types were placed in this encounter. ? ?No orders of the defined types were placed in this encounter. ? ? ? Procedures: ?No procedures performed ? ?Clinical Data: ?No additional findings. ? ?ROS: ? ?All other systems negative, except as noted in the HPI. ?Review of Systems ? ?Objective: ?Vital Signs: There were no vitals taken for this visit. ? ?Specialty Comments:  ?No specialty comments available. ? ?PMFS History: ?Patient Active Problem List  ? Diagnosis Date Noted  ? Hx of BKA, left ([place]) 09/09/2020  ? Vitamin D deficiency 07/30/2020  ? Chronic respiratory failure with hypoxia ([place]) 07/30/2020  ? Anxiety and depression 07/30/2020  ? Atrial fibrillation ([place]) 07/30/2020  ? Acute osteomyelitis of toe, left ([place])   ? Sepsis due to Lt foot osteo 01/23/2020  ? Morbid obesity with body mass index (BMI) of 50.0 to 59.9 in adult [facility]) 01/23/2020  ? Tobacco abuse--2 P/day Smoker 01/23/2020  ? Cellulitis of left foot 01/22/2020  ? Osteomyelitis of left foot ([place]) 01/22/2020  ? Leukocytosis 01/22/2020  ? Hypokalemia 01/22/2020  ? Hypoalbuminemia 01/22/2020  ? Hyperglycemia due to diabetes mellitus ([place]) 01/22/2020  ? Elevated alpha fetoprotein 01/22/2020  ? SIRS (systemic inflammatory response syndrome) ([place]) 01/22/2020  ? Diabetic ulcer of left foot ([place]) 12/05/2019  ? Cough 05/26/2012  ? TIA (transient ischemic attack) 04/10/2012  ? Acute diastolic congestive heart failure ([place]) 11/12/2011  ? COPD (chronic obstructive pulmonary disease) ([place]) 11/09/2011  ? Dyspnea 06/10/2011  ? CAP (community acquired pneumonia) 02/19/2011  ? Coronary atherosclerosis of native coronary artery/Stent in 2006 02/19/2011  ? Dyslipidemia   ? Morbid obesity ([place])   ? Diabetes mellitus type 2 in obese [facility])   ? HTN (hypertension), benign   ? ?Past Medical History:  ?Diagnosis Date  ? CAP (community acquired pneumonia)   ? Streptococcus 01/2011  ? COPD (chronic obstructive pulmonary disease) ([place])   ? Coronary atherosclerosis of  native coronary artery   ? Diagnosed [place] 2006 - DES RCA, reports followup cath 2009 at [facility]   ? Dyslipidemia   ? Essential hypertension, benign   ? Morbid obesity ([place])   ? Pancreatitis   ? Type 2 diabetes mellitus ([place])   ?  ?Family History  ?Problem Relation Age of Onset  ? Diabetes Mother   ? Heart failure Mother   ? Hypertension Mother   ? Kidney failure Mother   ? Ovarian cancer Mother   ?  Asthma Son   ?  ?Past Surgical History:  ?Procedure Laterality Date  ? ABDOMINAL HERNIA REPAIR    ? ABDOMINAL HYSTERECTOMY    ? AMPUTATION Left 01/25/2020  ? Procedure: LEFT BELOW KNEE AMPUTATION;  Surgeon: Newt Minion, MD;  Location: [facility];  Service: Orthopedics;  Laterality: Left;  ? CORONARY ANGIOPLASTY WITH STENT PLACEMENT  2006  ? KNEE SURGERY    ? NOSE SURGERY    ? Pilonidal cystectomy    ? TONSILLECTOMY    ? ?Social History  ? ?Occupational History  ? Occupation: Home health aide  ?  Employer: [employer]  ?Tobacco Use  ? Smoking status: Former  ?  Packs/day: 0.50  ?  Years: 20.00  ?  Pack years: 10.00  ?  Types: Cigarettes  ?  Quit date: 01/22/2020  ?  Years since quitting: 1.3  ? Smokeless tobacco: Never  ?Vaping Use  ? Vaping Use: Never used  ?Substance and Sexual Activity  ? Alcohol use: No  ? Drug use: No  ? Sexual activity: Yes  ?  Birth control/protection: Surgical  ? ? ? ? ? ?

## 2021-06-08 ENCOUNTER — Encounter (INDEPENDENT_AMBULATORY_CARE_PROVIDER_SITE_OTHER): Payer: Self-pay | Admitting: Gastroenterology

## 2021-06-08 ENCOUNTER — Ambulatory Visit (INDEPENDENT_AMBULATORY_CARE_PROVIDER_SITE_OTHER): Payer: Medicaid Other | Admitting: Gastroenterology

## 2021-06-16 ENCOUNTER — Other Ambulatory Visit: Payer: Self-pay

## 2021-06-16 ENCOUNTER — Inpatient Hospital Stay (HOSPITAL_COMMUNITY)
Admission: EM | Admit: 2021-06-16 | Discharge: 2021-06-20 | DRG: 603 | Disposition: A | Discharge status: Home / Self Care | Payer: Medicare Other | Attending: Internal Medicine | Admitting: Internal Medicine

## 2021-06-16 ENCOUNTER — Emergency Department (HOSPITAL_COMMUNITY): Payer: Medicare Other

## 2021-06-16 ENCOUNTER — Encounter (HOSPITAL_COMMUNITY): Payer: Self-pay

## 2021-06-16 DIAGNOSIS — J9611 Chronic respiratory failure with hypoxia: Secondary | ICD-10-CM | POA: Diagnosis not present

## 2021-06-16 DIAGNOSIS — L03115 Cellulitis of right lower limb: Secondary | ICD-10-CM | POA: Diagnosis not present

## 2021-06-16 DIAGNOSIS — Z8249 Family history of ischemic heart disease and other diseases of the circulatory system: Secondary | ICD-10-CM

## 2021-06-16 DIAGNOSIS — K219 Gastro-esophageal reflux disease without esophagitis: Secondary | ICD-10-CM | POA: Diagnosis not present

## 2021-06-16 DIAGNOSIS — E785 Hyperlipidemia, unspecified: Secondary | ICD-10-CM | POA: Diagnosis present

## 2021-06-16 DIAGNOSIS — Z89512 Acquired absence of left leg below knee: Secondary | ICD-10-CM | POA: Diagnosis not present

## 2021-06-16 DIAGNOSIS — E1169 Type 2 diabetes mellitus with other specified complication: Secondary | ICD-10-CM

## 2021-06-16 DIAGNOSIS — Z7902 Long term (current) use of antithrombotics/antiplatelets: Secondary | ICD-10-CM

## 2021-06-16 DIAGNOSIS — S81801A Unspecified open wound, right lower leg, initial encounter: Secondary | ICD-10-CM | POA: Diagnosis not present

## 2021-06-16 DIAGNOSIS — Z79899 Other long term (current) drug therapy: Secondary | ICD-10-CM

## 2021-06-16 DIAGNOSIS — E1165 Type 2 diabetes mellitus with hyperglycemia: Secondary | ICD-10-CM

## 2021-06-16 DIAGNOSIS — E872 Acidosis, unspecified: Secondary | ICD-10-CM | POA: Diagnosis not present

## 2021-06-16 DIAGNOSIS — Z841 Family history of disorders of kidney and ureter: Secondary | ICD-10-CM | POA: Diagnosis not present

## 2021-06-16 DIAGNOSIS — J449 Chronic obstructive pulmonary disease, unspecified: Secondary | ICD-10-CM | POA: Diagnosis not present

## 2021-06-16 DIAGNOSIS — Z6841 Body Mass Index (BMI) 40.0 and over, adult: Secondary | ICD-10-CM

## 2021-06-16 DIAGNOSIS — E11621 Type 2 diabetes mellitus with foot ulcer: Secondary | ICD-10-CM | POA: Diagnosis not present

## 2021-06-16 DIAGNOSIS — I25119 Atherosclerotic heart disease of native coronary artery with unspecified angina pectoris: Secondary | ICD-10-CM | POA: Diagnosis present

## 2021-06-16 DIAGNOSIS — Z8041 Family history of malignant neoplasm of ovary: Secondary | ICD-10-CM

## 2021-06-16 DIAGNOSIS — E66813 Obesity, class 3: Secondary | ICD-10-CM

## 2021-06-16 DIAGNOSIS — Z91030 Bee allergy status: Secondary | ICD-10-CM

## 2021-06-16 DIAGNOSIS — Z7985 Long-term (current) use of injectable non-insulin antidiabetic drugs: Secondary | ICD-10-CM

## 2021-06-16 DIAGNOSIS — Z794 Long term (current) use of insulin: Secondary | ICD-10-CM

## 2021-06-16 DIAGNOSIS — I2510 Atherosclerotic heart disease of native coronary artery without angina pectoris: Secondary | ICD-10-CM | POA: Diagnosis present

## 2021-06-16 DIAGNOSIS — L02415 Cutaneous abscess of right lower limb: Secondary | ICD-10-CM | POA: Diagnosis not present

## 2021-06-16 DIAGNOSIS — Z955 Presence of coronary angioplasty implant and graft: Secondary | ICD-10-CM | POA: Diagnosis not present

## 2021-06-16 DIAGNOSIS — F419 Anxiety disorder, unspecified: Secondary | ICD-10-CM | POA: Diagnosis present

## 2021-06-16 DIAGNOSIS — Z87891 Personal history of nicotine dependence: Secondary | ICD-10-CM

## 2021-06-16 DIAGNOSIS — Z7984 Long term (current) use of oral hypoglycemic drugs: Secondary | ICD-10-CM

## 2021-06-16 DIAGNOSIS — Z833 Family history of diabetes mellitus: Secondary | ICD-10-CM

## 2021-06-16 DIAGNOSIS — Z825 Family history of asthma and other chronic lower respiratory diseases: Secondary | ICD-10-CM

## 2021-06-16 DIAGNOSIS — Z886 Allergy status to analgesic agent status: Secondary | ICD-10-CM | POA: Diagnosis not present

## 2021-06-16 DIAGNOSIS — I10 Essential (primary) hypertension: Secondary | ICD-10-CM | POA: Diagnosis present

## 2021-06-16 DIAGNOSIS — M7989 Other specified soft tissue disorders: Secondary | ICD-10-CM | POA: Diagnosis not present

## 2021-06-16 DIAGNOSIS — Z881 Allergy status to other antibiotic agents status: Secondary | ICD-10-CM

## 2021-06-16 DIAGNOSIS — Z91018 Allergy to other foods: Secondary | ICD-10-CM

## 2021-06-16 LAB — CBC WITH DIFFERENTIAL/PLATELET
Abs Immature Granulocytes: 0.06 10*3/uL (ref 0.00–0.07)
Basophils Absolute: 0.1 10*3/uL (ref 0.0–0.1)
Basophils Relative: 1 %
Eosinophils Absolute: 0.3 10*3/uL (ref 0.0–0.5)
Eosinophils Relative: 3 %
HCT: 35.8 % — ABNORMAL LOW (ref 36.0–46.0)
Hemoglobin: 11.6 g/dL — ABNORMAL LOW (ref 12.0–15.0)
Immature Granulocytes: 1 %
Lymphocytes Relative: 21 %
Lymphs Abs: 2.2 10*3/uL (ref 0.7–4.0)
MCH: 30.4 pg (ref 26.0–34.0)
MCHC: 32.4 g/dL (ref 30.0–36.0)
MCV: 94 fL (ref 80.0–100.0)
Monocytes Absolute: 0.7 10*3/uL (ref 0.1–1.0)
Monocytes Relative: 7 %
Neutro Abs: 7 10*3/uL (ref 1.7–7.7)
Neutrophils Relative %: 67 %
Platelets: 214 10*3/uL (ref 150–400)
RBC: 3.81 MIL/uL — ABNORMAL LOW (ref 3.87–5.11)
RDW: 14.2 % (ref 11.5–15.5)
WBC: 10.4 10*3/uL (ref 4.0–10.5)
nRBC: 0 % (ref 0.0–0.2)

## 2021-06-16 LAB — URINALYSIS, COMPLETE (UACMP) WITH MICROSCOPIC
Bilirubin Urine: NEGATIVE
Glucose, UA: >=500 mg/dL — AB
Hgb urine dipstick: NEGATIVE
Ketones, ur: NEGATIVE mg/dL
Nitrite: NEGATIVE
Protein, ur: 100 mg/dL — AB
Specific Gravity, Urine: 1.02 (ref 1.005–1.030)
pH: 6 (ref 5.0–8.0)

## 2021-06-16 LAB — COMPREHENSIVE METABOLIC PANEL
ALT: 14 U/L (ref 0–44)
AST: 24 U/L (ref 15–41)
Albumin: 3.2 g/dL — ABNORMAL LOW (ref 3.5–5.0)
Alkaline Phosphatase: 89 U/L (ref 38–126)
Anion gap: 8 (ref 5–15)
BUN: 23 mg/dL — ABNORMAL HIGH (ref 6–20)
CO2: 29 mmol/L (ref 22–32)
Calcium: 9.1 mg/dL (ref 8.9–10.3)
Chloride: 99 mmol/L (ref 98–111)
Creatinine, Ser: 0.83 mg/dL (ref 0.44–1.00)
GFR, Estimated: >60 mL/min (ref >60)
Glucose, Bld: 408 mg/dL — ABNORMAL HIGH (ref 70–99)
Potassium: 4.1 mmol/L (ref 3.5–5.1)
Sodium: 136 mmol/L (ref 135–145)
Total Bilirubin: 0.4 mg/dL (ref 0.3–1.2)
Total Protein: 7 g/dL (ref 6.5–8.1)

## 2021-06-16 LAB — C-REACTIVE PROTEIN: CRP: 1.2 mg/dL — ABNORMAL HIGH (ref <1.0)

## 2021-06-16 LAB — SEDIMENTATION RATE: Sed Rate: 47 mm/hr — ABNORMAL HIGH (ref 0–22)

## 2021-06-16 LAB — RAPID URINE DRUG SCREEN, HOSP PERFORMED
Amphetamines: NOT DETECTED
Barbiturates: NOT DETECTED
Benzodiazepines: NOT DETECTED
Cocaine: NOT DETECTED
Opiates: POSITIVE — AB
Tetrahydrocannabinol: NOT DETECTED

## 2021-06-16 LAB — GLUCOSE, CAPILLARY
Glucose-Capillary: 313 mg/dL — ABNORMAL HIGH (ref 70–99)
Glucose-Capillary: 364 mg/dL — ABNORMAL HIGH (ref 70–99)

## 2021-06-16 LAB — LACTIC ACID, PLASMA: Lactic Acid, Venous: 2.9 mmol/L (ref 0.5–1.9)

## 2021-06-16 MED ORDER — ACETAMINOPHEN 325 MG PO TABS
650.0000 mg | ORAL_TABLET | Freq: Four times a day (QID) | ORAL | Status: DC | PRN
  Administered 2021-06-18: 650 mg via ORAL
  Filled 2021-06-16: qty 2

## 2021-06-16 MED ORDER — ENOXAPARIN SODIUM 80 MG/0.8ML IJ SOSY
0.5000 mg/kg | PREFILLED_SYRINGE | INTRAMUSCULAR | Status: DC
  Administered 2021-06-16 – 2021-06-18 (×3): 67.5 mg via SUBCUTANEOUS
  Filled 2021-06-16 (×3): qty 0.8

## 2021-06-16 MED ORDER — SODIUM CHLORIDE 0.9 % IV BOLUS
1000.0000 mL | Freq: Once | INTRAVENOUS | Status: AC
  Administered 2021-06-16: 1000 mL via INTRAVENOUS

## 2021-06-16 MED ORDER — LISINOPRIL 5 MG PO TABS
2.5000 mg | ORAL_TABLET | Freq: Every day | ORAL | Status: DC
  Administered 2021-06-16 – 2021-06-20 (×5): 2.5 mg via ORAL
  Filled 2021-06-16 (×5): qty 1

## 2021-06-16 MED ORDER — MORPHINE SULFATE (PF) 4 MG/ML IV SOLN
4.0000 mg | Freq: Once | INTRAVENOUS | Status: AC
  Administered 2021-06-16: 4 mg via INTRAVENOUS
  Filled 2021-06-16: qty 1

## 2021-06-16 MED ORDER — INSULIN ASPART 100 UNIT/ML IJ SOLN
5.0000 [IU] | Freq: Three times a day (TID) | INTRAMUSCULAR | Status: DC
  Administered 2021-06-16 – 2021-06-17 (×3): 5 [IU] via SUBCUTANEOUS

## 2021-06-16 MED ORDER — VANCOMYCIN HCL 1750 MG/350ML IV SOLN
1750.0000 mg | INTRAVENOUS | Status: DC
  Administered 2021-06-16 – 2021-06-18 (×3): 1750 mg via INTRAVENOUS
  Filled 2021-06-16 (×5): qty 350

## 2021-06-16 MED ORDER — SODIUM CHLORIDE 0.9 % IV SOLN: INTRAVENOUS | Status: AC

## 2021-06-16 MED ORDER — INSULIN ASPART 100 UNIT/ML IJ SOLN
0.0000 [IU] | Freq: Every day | INTRAMUSCULAR | Status: DC
  Administered 2021-06-16: 5 [IU] via SUBCUTANEOUS
  Administered 2021-06-17: 4 [IU] via SUBCUTANEOUS
  Administered 2021-06-19: 2 [IU] via SUBCUTANEOUS

## 2021-06-16 MED ORDER — PANTOPRAZOLE SODIUM 40 MG PO TBEC
40.0000 mg | DELAYED_RELEASE_TABLET | Freq: Every day | ORAL | Status: DC
  Administered 2021-06-16 – 2021-06-20 (×5): 40 mg via ORAL
  Filled 2021-06-16 (×5): qty 1

## 2021-06-16 MED ORDER — METOPROLOL TARTRATE 50 MG PO TABS
50.0000 mg | ORAL_TABLET | Freq: Two times a day (BID) | ORAL | Status: DC
  Administered 2021-06-16 – 2021-06-20 (×8): 50 mg via ORAL
  Filled 2021-06-16 (×8): qty 1

## 2021-06-16 MED ORDER — INSULIN ASPART 100 UNIT/ML IJ SOLN
0.0000 [IU] | Freq: Three times a day (TID) | INTRAMUSCULAR | Status: DC
  Administered 2021-06-16: 15 [IU] via SUBCUTANEOUS
  Administered 2021-06-17: 11 [IU] via SUBCUTANEOUS
  Administered 2021-06-17: 15 [IU] via SUBCUTANEOUS
  Administered 2021-06-17 – 2021-06-19 (×4): 7 [IU] via SUBCUTANEOUS
  Administered 2021-06-19: 4 [IU] via SUBCUTANEOUS
  Administered 2021-06-20: 11 [IU] via SUBCUTANEOUS

## 2021-06-16 MED ORDER — ENOXAPARIN SODIUM 40 MG/0.4ML IJ SOSY: 40.0000 mg | PREFILLED_SYRINGE | INTRAMUSCULAR | Status: DC

## 2021-06-16 MED ORDER — DOCUSATE SODIUM 100 MG PO CAPS: 100.0000 mg | ORAL_CAPSULE | Freq: Two times a day (BID) | ORAL | Status: DC | PRN

## 2021-06-16 MED ORDER — INSULIN GLARGINE-YFGN 100 UNIT/ML ~~LOC~~ SOLN
40.0000 [IU] | Freq: Once | SUBCUTANEOUS | Status: AC
  Administered 2021-06-16: 40 [IU] via SUBCUTANEOUS
  Filled 2021-06-16: qty 0.4

## 2021-06-16 MED ORDER — ONDANSETRON HCL 4 MG/2ML IJ SOLN
4.0000 mg | Freq: Four times a day (QID) | INTRAMUSCULAR | Status: DC | PRN
Start: 2021-06-16 — End: 2021-06-20
  Administered 2021-06-16 – 2021-06-17 (×2): 4 mg via INTRAVENOUS
  Filled 2021-06-16 (×2): qty 2

## 2021-06-16 MED ORDER — ROSUVASTATIN CALCIUM 20 MG PO TABS
40.0000 mg | ORAL_TABLET | Freq: Every day | ORAL | Status: DC
  Administered 2021-06-16 – 2021-06-20 (×5): 40 mg via ORAL
  Filled 2021-06-16 (×5): qty 2

## 2021-06-16 MED ORDER — CLOPIDOGREL BISULFATE 75 MG PO TABS
75.0000 mg | ORAL_TABLET | Freq: Every day | ORAL | Status: DC
  Administered 2021-06-16 – 2021-06-20 (×5): 75 mg via ORAL
  Filled 2021-06-16 (×5): qty 1

## 2021-06-16 MED ORDER — HYDROCODONE-ACETAMINOPHEN 5-325 MG PO TABS
1.0000 | ORAL_TABLET | Freq: Four times a day (QID) | ORAL | Status: DC | PRN
  Administered 2021-06-16 – 2021-06-20 (×7): 1 via ORAL
  Filled 2021-06-16 (×7): qty 1

## 2021-06-16 MED ORDER — INSULIN GLARGINE-YFGN 100 UNIT/ML ~~LOC~~ SOLN
80.0000 [IU] | Freq: Two times a day (BID) | SUBCUTANEOUS | Status: DC
  Administered 2021-06-16 – 2021-06-17 (×3): 80 [IU] via SUBCUTANEOUS
  Filled 2021-06-16 (×6): qty 0.8

## 2021-06-16 NOTE — Assessment & Plan Note (Addendum)
-   Poorly controlled ?-A1c on admission, 13.1% ?-Patient on U-500 with frequently missed doses at home ?- d/c semglee and convert back to regular insulin ?- giving 80 units per dose seems to have dropped CBGs into 60s which raises concern for inappropriate administration at home (scar tissue, etc) vs poor diet control given that her diet is more regulated in hospital; I've discussed these concerns with patient and she does endorse a worse diet at home in general ?-Patient was tolerating 30 to 40 units per meal of regular insulin.  Discussed at discharge she may need to continue further adjusting her regimen and continue trying to watch carb intake in efforts to reduce amount of insulin required ?-Recommended to follow-up with endocrinology as well ?

## 2021-06-16 NOTE — Assessment & Plan Note (Signed)
Continue metoprolol and lisinopril. 

## 2021-06-16 NOTE — ED Provider Notes (Signed)
?[facility] ?Provider Note ? ? ?CSN: [id number] ?Arrival date & time: 06/16/21  1194 ? ?  ? ?History ? ?Chief Complaint  ?Patient presents with  ? Abrasion  ? ? ?Nicole [name] is a 56 y.o. female. ? ?Patient with history of diabetes, hypertension, CHF, osteomyelitis with left BKA presents today with complaints of right lower leg wound.  She states that 5 days ago she noticed a blister on her right shin, states that she applied topical antibiotic cream and a bandage to it and 'didnt think anything else of it' until she noticed that it had popped and was draining, states she attempted to continue at home wound care without success.  Noticed this morning that same was painful and had developed surrounding redness consistent with her previous infection that led to her BKA on the left side.  States that previously when she had this issue on the left side she was discharged home with oral antibiotics and subsequently returned 2 days later needed IV antibiotics and BKA for osteomyelitis. She endorses some subjective fevers and chills, she has been taking Motrin regularly for management of her pain. ? ?The history is provided by the patient. No language interpreter was used.  ? ? ?  ? ?Home Medications ?Prior to Admission medications   ?Medication Sig Start Date End Date Taking? Authorizing Provider  ?ACCU-CHEK GUIDE test strip USE 1 STRIP TO CHECK GLUCOSE 4 TIMES DAILY 03/11/21   Brita Romp, NP  ?acetaminophen (TYLENOL) 500 MG tablet Take 1 tablet (500 mg total) by mouth every 6 (six) hours as needed for moderate pain. 02/05/20   Amin, Jeanella Flattery, MD  ?albuterol (PROVENTIL) (2.5 MG/3ML) 0.083% nebulizer solution Take 3 mLs (2.5 mg total) by nebulization every 6 (six) hours as needed for wheezing. 07/30/20   Ailene Ards, NP  ?albuterol (VENTOLIN HFA) 108 (90 Base) MCG/ACT inhaler Inhale 2 puffs into the lungs every 6 (six) hours as needed for wheezing. Shortness of breath 07/30/20   Ailene Ards, NP  ?Bartolo Darter COVID-19 AG HOME TEST KIT See admin instructions. 12/07/20   [provider]  ?blood glucose meter kit and supplies Dispense based on patient and insurance preference. Use four times daily as directed. (FOR ICD-10 E10.9, E11.9). 01/09/21   Brita Romp, NP  ?buPROPion ER [facility] SR) 100 MG 12 hr tablet Take 1 tablet (100 mg total) by mouth 2 (two) times daily. 12/23/20   [name], NP  ?Cholecalciferol 25 MCG (1000 UT) capsule Take 1,000 Units by mouth daily.    [provider]  ?clopidogrel (PLAVIX) 75 MG tablet Take 1 tablet (75 mg total) by mouth daily. 07/30/20   Ailene Ards, NP  ?docusate sodium (COLACE) 100 MG capsule Take 1 capsule (100 mg total) by mouth 2 (two) times daily as needed for mild constipation. 07/30/20   Ailene Ards, NP  ?DULoxetine (CYMBALTA) 60 MG capsule Take 1 capsule (60 mg total) by mouth daily. 07/30/20   Ailene Ards, NP  ?Cathren Laine INSULIN SYRINGE 31G X 5/16" 1 ML MISC  09/21/19   [provider]  ?EPINEPHrine 0.3 mg/0.3 mL IJ SOAJ injection Inject 0.3 mg into the muscle as needed for anaphylaxis. 05/15/20   Ailene Ards, NP  ?furosemide (LASIX) 80 MG tablet Take 1 tablet (80 mg total) by mouth daily. 07/30/20   Ailene Ards, NP  ?gentamicin cream (GARAMYCIN) 0.1 % Apply 1 application topically 2 (two) times daily. 12/24/20  Edrick Kins, DPM  ?glipiZIDE (GLUCOTROL) 10 MG tablet Take 1 tablet (10 mg total) by mouth in the morning and at bedtime. 12/23/20 03/23/21  [name], NP  ?Insulin Pen Needle (PEN NEEDLES) 31G X 8 MM MISC UAD 12/23/20   [name], NP  ?insulin regular human CONCENTRATED (HUMULIN R U-500 KWIKPEN) 500 UNIT/ML KwikPen Inject 80 Units into the skin 3 (three) times daily with meals. 01/23/21   Brita Romp, NP  ?lisinopril (ZESTRIL) 2.5 MG tablet Take 1 tablet (2.5 mg total) by mouth daily. 07/30/20   Ailene Ards, NP  ?loperamide (IMODIUM) 2 MG capsule Take 1 capsule (2 mg total) by mouth every 6  (six) hours as needed for diarrhea or loose stools. 12/10/19   Kathie Dike, MD  ?metoprolol tartrate (LOPRESSOR) 50 MG tablet Take 1 tablet (50 mg total) by mouth 2 (two) times daily. 07/30/20   Ailene Ards, NP  ?Multiple Vitamins-Calcium (ONE-A-DAY WOMENS FORMULA) TABS Take 1 tablet by mouth daily.    [provider]  ?mupirocin ointment (BACTROBAN) 2 % Apply topically daily. 12/10/19   Kathie Dike, MD  ?nitroGLYCERIN (NITROSTAT) 0.4 MG SL tablet Take one tablet every 5 minutes as needed for chest pains up to 3 tablets. 10/03/20   Fay Records, MD  ?pantoprazole (PROTONIX) 40 MG tablet Take 1 tablet (40 mg total) by mouth daily. 07/30/20   Ailene Ards, NP  ?senna-docusate (SENOKOT-S) 8.6-50 MG tablet Take 1 tablet by mouth at bedtime as needed for mild constipation. 02/05/20   Amin, Jeanella Flattery, MD  ?simvastatin (ZOCOR) 80 MG tablet Take 1 tablet (80 mg total) by mouth daily. 07/30/20   Ailene Ards, NP  ?TRULICITY 1.5 GY/1.8HU SOPN Inject 1.5 mg into the skin once a week. 07/30/20   Ailene Ards, NP  ?   ? ?Allergies    ?Aspirin, Bee pollen, Bee venom, Food, Pineapple, E-mycin [erythromycin base], and Other   ? ?Review of Systems   ?Review of Systems  ?Constitutional:  Positive for fever (subjective, not currently).  ?Musculoskeletal:  Positive for arthralgias and myalgias.  ?Skin:  Positive for wound.  ?All other systems reviewed and are negative. ? ?Physical Exam ?Updated Vital Signs ?BP (!) 149/118 (BP Location: Right Arm)   Pulse (!) 101   Temp 98 ?F (36.7 ?C) (Oral)   Resp 18   Ht 5' 4"  (1.626 m)   Wt 136.1 kg   SpO2 96%   BMI 51.49 kg/m?  ?Physical Exam ?Vitals and nursing note reviewed.  ?Constitutional:   ?   General: She is not in acute distress. ?   Appearance: Normal appearance. She is normal weight. She is not ill-appearing, toxic-appearing or diaphoretic.  ?HENT:  ?   Head: Normocephalic and atraumatic.  ?Cardiovascular:  ?   Rate and Rhythm: Normal rate.  ?Pulmonary:  ?    Effort: Pulmonary effort is normal. No respiratory distress.  ?Musculoskeletal:     ?   General: Normal range of motion.  ?   Cervical back: Normal range of motion.  ?   Comments: LLE BKA, RLE wound with surrounding warmth and erythema and tenderness to palpation. DP and PT pulses intact, capillary refill less than 2 seconds. No drainage present  ?Skin: ?   General: Skin is warm and dry.  ?Neurological:  ?   General: No focal deficit present.  ?   Mental Status: She is alert.  ?Psychiatric:     ?   Mood  and Affect: Mood normal.     ?   Behavior: Behavior normal.  ? ? ?ED Results / Procedures / Treatments   ?Labs ?(all labs ordered are listed, but only abnormal results are displayed) ?Labs Reviewed  ?COMPREHENSIVE METABOLIC PANEL - Abnormal; Notable for the following components:  ?    Result Value  ? Glucose, Bld 408 (*)   ? BUN 23 (*)   ? Albumin 3.2 (*)   ? All other components within normal limits  ?CBC WITH DIFFERENTIAL/PLATELET - Abnormal; Notable for the following components:  ? RBC 3.81 (*)   ? Hemoglobin 11.6 (*)   ? HCT 35.8 (*)   ? All other components within normal limits  ?LACTIC ACID, PLASMA - Abnormal; Notable for the following components:  ? Lactic Acid, Venous 2.9 (*)   ? All other components within normal limits  ?CULTURE, BLOOD (ROUTINE X 2)  ?CULTURE, BLOOD (ROUTINE X 2)  ? ? ?EKG ?None ? ?Radiology ?DG Tibia/Fibula Right ? ?Result Date: 06/16/2021 ?CLINICAL DATA:  Diabetic leg wound anteriorly EXAM: RIGHT TIBIA AND FIBULA - 2 VIEW COMPARISON:  10/31/2020 FINDINGS: Negative for fracture. Negative for osteomyelitis. Anterior soft tissue swelling. Degenerative change in the knee. Prominent spurring of the calcaneus at the Achilles tendon insertion and plantar fascia insertion. IMPRESSION: Negative for fracture or osteomyelitis. Electronically Signed   By: Franchot Gallo M.D.   On: 06/16/2021 12:04   ? ?Procedures ?Procedures  ? ? ?Medications Ordered in ED ?Medications  ?morphine (PF) 4 MG/ML  injection 4 mg (has no administration in time range)  ? ? ?ED Course/ Medical Decision Making/ A&P ?  ?                        ?Medical Decision Making ?Amount and/or Complexity of Data Reviewed ?Labs: ordered. ?Radiolog

## 2021-06-16 NOTE — Progress Notes (Signed)
Pharmacy Antibiotic Note ? ?Nicole [name] is a 56 y.o. female admitted on 06/16/2021 with left lower leg cellulitis.  Pharmacy has been consulted for Vancomycin dosing. ? ?Plan: ?Vancomycin 1750mg  IV q 24h ? ?Height: 5\' 4"  (162.6 cm) ?Weight: 136.1 kg (300 lb) ?IBW/kg (Calculated) : 54.7 ?Ke 0.058; Vd = 68L, calculated AUC 443 ? ?Temp (24hrs), Avg:98 ?F (36.7 ?C), Min:98 ?F (36.7 ?C), Max:98 ?F (36.7 ?C) ? ?Recent Labs  ?Lab 06/16/21 ?1145 06/16/21 ?1147  ?WBC 10.4  --   ?CREATININE 0.83  --   ?LATICACIDVEN  --  2.9*  ?  ?Estimated Creatinine Clearance: 103.1 mL/min (by C-G formula based on SCr of 0.83 mg/dL).   ? ? ?Microbiology results: ?4/25 BCx: Pending ? ? ?Thank you for allowing pharmacy to be a part of this patient?s care. ? ?Lorenso Courier, PharmD ?Clinical Pharmacist ?06/16/2021 1:35 PM ? ? ? ? ? ? ?

## 2021-06-16 NOTE — ED Notes (Signed)
Assisted pt to restroom  

## 2021-06-16 NOTE — Assessment & Plan Note (Addendum)
She is chronically on 3 L nasal cannula ?

## 2021-06-16 NOTE — Assessment & Plan Note (Addendum)
Patient is afebrile hemodynamically stable without any tachycardia ?Sepsis ruled out ?- cleared with IVF ?

## 2021-06-16 NOTE — Hospital Course (Addendum)
Nicole [name] is a 56 year old female with PMH poorly controlled DMII, morbid obesity, CAD, HLD, GERD, anxiety who presented with erythema, pain, and swelling of her right lower extremity.  Symptoms had been progressing for approximately 5 days prior to admission after she developed a blister on her right shin.  She states after rupturing it continued to have intermittent bleeding. ?Due to worsening pain and redness, she presented for further evaluation. ?She has been on no antibiotics outpatient. ?X-ray revealed no acute findings.  She was started on vancomycin and admitted for further monitoring. ?See below for further problem-based plan. ?

## 2021-06-16 NOTE — Assessment & Plan Note (Addendum)
Continue Crestor 

## 2021-06-16 NOTE — ED Triage Notes (Signed)
Patient with abrasion to right lower leg a couple days prior. Now is red and burning.  ?

## 2021-06-16 NOTE — Assessment & Plan Note (Addendum)
-   low concern for abscess at this time; patient did describe a "blister" at home prior to admission, but appearance now is moreso excoriated skin and wound is not deep ?- erythema continues to improve  ?-Unable to express any purulent fluid on exam; edema appreciated but no fluctuance ?- okay to transition to keflex to complete 7 day course ?-Apply Vaseline dressing and cover.  Change daily ?

## 2021-06-16 NOTE — Assessment & Plan Note (Addendum)
-   Complicates overall prognosis and care ?- Body mass index is 50.03 kg/m?. ?- Weight Loss and Dietary Counseling given ?

## 2021-06-16 NOTE — Assessment & Plan Note (Signed)
No chest pain presently °Continue Plavix °

## 2021-06-16 NOTE — ED Triage Notes (Signed)
Date and time results received: 06/16/21 1228 ?(use smartphrase ".now" to insert current time) ? ?Test: lactic  ?Critical Value: 2.9 ? ?Name of Provider Notified: Smoot PA ? ?Orders Received? Or Actions Taken?: no/na ?

## 2021-06-16 NOTE — H&P (Addendum)
?History and Physical  ? ? ?Patient: Nicole [name] ZDG:[id number] DOB: 1965-12-14 ?DOA: 06/16/2021 ?DOS: the patient was seen and examined on 06/16/2021 ?PCP: Celene Squibb, MD  ?Patient coming from: Home ? ?Chief Complaint:  ?Chief Complaint  ?Patient presents with  ? Abrasion  ? ?HPI: Nicole [name] is a 56 year old female with history of hypertension, diabetes mellitus, coronary artery disease, hyperlipidemia, GERD, and anxiety presenting with erythema, pain, and swelling of her right lower extremity pretibial area.  The patient states that about 5 days prior to this admission she noticed a blister on her right pretibial area while she was in the shower.  This eventually ruptured, and the next morning she noticed a "blood spot" on the area where the initial fluid blister ruptured.  She did notice bleeding, but has been using antibiotic ointment and dressings onto the leg on a daily basis.  However she woke up on 2 AM on 06/16/2021 with increasing pain and redness on her right lower extremity.  She states that her daughter has been changing the dressings, and she does not particularly look at the wound herself.  Therefore, the patient is unable to tell me if its been draining any purulent material.  She has had some subjective fevers and chills, but denies any chest pain, shortness of breath, coughing, hemoptysis, nausea, vomiting, diarrhea or abdominal pain.  She quit smoking about 2 years ago after approximate 20-pack-year history.  She has not been on any recent antibiotics. ?In the ED, the patient was afebrile hemodynamically stable with oxygen saturation 98% on room air.  WBC 10.4, hemoglobin 9.6, platelets 214.  Sodium 136, potassium 4.1, bicarbonate 28, serum creatinine 0.81, serum glucose 408.  Lactic acid 2.9.  LFTs were unremarkable.  X-ray of the right tib-fib area was negative for any gas or fracture.  The patient was started on IV vancomycin in the ED. ? ?Review of Systems: As mentioned in the history of  present illness. All other systems reviewed and are negative. ?Past Medical History:  ?Diagnosis Date  ? CAP (community acquired pneumonia)   ? Streptococcus 01/2011  ? COPD (chronic obstructive pulmonary disease) ([place])   ? Coronary atherosclerosis of native coronary artery   ? Diagnosed [place] 2006 - DES RCA, reports followup cath 2009 at [facility]   ? Dyslipidemia   ? Essential hypertension, benign   ? Morbid obesity ([place])   ? Pancreatitis   ? Type 2 diabetes mellitus ([place])   ? ?Past Surgical History:  ?Procedure Laterality Date  ? ABDOMINAL HERNIA REPAIR    ? ABDOMINAL HYSTERECTOMY    ? AMPUTATION Left 01/25/2020  ? Procedure: LEFT BELOW KNEE AMPUTATION;  Surgeon: Newt Minion, MD;  Location: [place];  Service: Orthopedics;  Laterality: Left;  ? CORONARY ANGIOPLASTY WITH STENT PLACEMENT  2006  ? KNEE SURGERY    ? NOSE SURGERY    ? Pilonidal cystectomy    ? TONSILLECTOMY    ? ?Social History:  reports that she quit smoking about 16 months ago. Her smoking use included cigarettes. She has a 10.00 pack-year smoking history. She has never used smokeless tobacco. She reports that she does not drink alcohol and does not use drugs. ? ?Allergies  ?Allergen Reactions  ? Aspirin Anaphylaxis and Other (See Comments)  ?  Throat closing   ? Bee Pollen Anaphylaxis  ? Bee Venom Anaphylaxis  ? Food Anaphylaxis  ?  PINEAPPLE  ? Pineapple Anaphylaxis  ? E-Mycin [Erythromycin Base] Nausea And Vomiting  ?  Other Rash  ? ? ?Family History  ?Problem Relation Age of Onset  ? Diabetes Mother   ? Heart failure Mother   ? Hypertension Mother   ? Kidney failure Mother   ? Ovarian cancer Mother   ? Asthma Son   ? ? ?Prior to Admission medications   ?Medication Sig Start Date End Date Taking? Authorizing Provider  ?acetaminophen (TYLENOL) 500 MG tablet Take 1 tablet (500 mg total) by mouth every 6 (six) hours as needed for moderate pain. 02/05/20  Yes Amin, Jeanella Flattery, MD  ?albuterol (PROVENTIL) (2.5 MG/3ML) 0.083% nebulizer solution Take 3  mLs (2.5 mg total) by nebulization every 6 (six) hours as needed for wheezing. 07/30/20  Yes Ailene Ards, NP  ?albuterol (VENTOLIN HFA) 108 (90 Base) MCG/ACT inhaler Inhale 2 puffs into the lungs every 6 (six) hours as needed for wheezing. Shortness of breath 07/30/20  Yes Ailene Ards, NP  ?buPROPion ER [facility] SR) 100 MG 12 hr tablet Take 1 tablet (100 mg total) by mouth 2 (two) times daily. 12/23/20  Yes Minette Brine, Amy J, NP  ?celecoxib (CELEBREX) 200 MG capsule Take 200 mg by mouth 2 (two) times daily. 03/23/21  Yes [provider]  ?clopidogrel (PLAVIX) 75 MG tablet Take 1 tablet (75 mg total) by mouth daily. 07/30/20  Yes Ailene Ards, NP  ?docusate sodium (COLACE) 100 MG capsule Take 1 capsule (100 mg total) by mouth 2 (two) times daily as needed for mild constipation. 07/30/20  Yes Ailene Ards, NP  ?DULoxetine (CYMBALTA) 60 MG capsule Take 1 capsule (60 mg total) by mouth daily. 07/30/20  Yes Ailene Ards, NP  ?furosemide (LASIX) 80 MG tablet Take 1 tablet (80 mg total) by mouth daily. ?Patient taking differently: Take 80 mg by mouth daily as needed for fluid. 07/30/20  Yes Ailene Ards, NP  ?glipiZIDE (GLUCOTROL) 10 MG tablet Take 1 tablet (10 mg total) by mouth in the morning and at bedtime. 12/23/20 06/16/21 Yes Minette Brine, Amy J, NP  ?insulin regular human CONCENTRATED (HUMULIN R U-500 KWIKPEN) 500 UNIT/ML KwikPen Inject 80 Units into the skin 3 (three) times daily with meals. 01/23/21  Yes Brita Romp, NP  ?KERENDIA 20 MG TABS Take 1 tablet by mouth daily. 04/20/21  Yes [provider]  ?lisinopril (ZESTRIL) 2.5 MG tablet Take 1 tablet (2.5 mg total) by mouth daily. ?Patient taking differently: Take 10 mg by mouth daily. 07/30/20  Yes Ailene Ards, NP  ?loperamide (IMODIUM) 2 MG capsule Take 1 capsule (2 mg total) by mouth every 6 (six) hours as needed for diarrhea or loose stools. 12/10/19  Yes Kathie Dike, MD  ?metoprolol tartrate (LOPRESSOR) 50 MG tablet Take 1 tablet (50 mg total)  by mouth 2 (two) times daily. 07/30/20  Yes Ailene Ards, NP  ?Multiple Vitamins-Calcium (ONE-A-DAY WOMENS FORMULA) TABS Take 1 tablet by mouth daily.   Yes [provider]  ?pantoprazole (PROTONIX) 40 MG tablet Take 1 tablet (40 mg total) by mouth daily. 07/30/20  Yes Ailene Ards, NP  ?rosuvastatin (CRESTOR) 40 MG tablet Take 40 mg by mouth daily. 04/16/21  Yes [provider]  ?TRULICITY 1.5 HA/1.9FX SOPN Inject 1.5 mg into the skin once a week. 07/30/20  Yes Ailene Ards, NP  ?Vitamin D, Ergocalciferol, (DRISDOL) 1.25 MG (50000 UNIT) CAPS capsule Take 50,000 Units by mouth once a week. 04/16/21  Yes [provider]  ?ACCU-CHEK GUIDE test strip USE 1 STRIP TO CHECK GLUCOSE 4  TIMES DAILY 03/11/21   Brita Romp, NP  ?blood glucose meter kit and supplies Dispense based on patient and insurance preference. Use four times daily as directed. (FOR ICD-10 E10.9, E11.9). 01/09/21   Brita Romp, NP  ?EASY TOUCH INSULIN SYRINGE 31G X 5/16" 1 ML MISC  09/21/19   [provider]  ?EPINEPHrine 0.3 mg/0.3 mL IJ SOAJ injection Inject 0.3 mg into the muscle as needed for anaphylaxis. 05/15/20   Ailene Ards, NP  ?gentamicin cream (GARAMYCIN) 0.1 % Apply 1 application topically 2 (two) times daily. ?Patient not taking: Reported on 06/16/2021 12/24/20   Edrick Kins, DPM  ?Insulin Pen Needle (PEN NEEDLES) 31G X 8 MM MISC UAD 12/23/20   [name], NP  ?mupirocin ointment (BACTROBAN) 2 % Apply topically daily. ?Patient not taking: Reported on 06/16/2021 12/10/19   Kathie Dike, MD  ?nitroGLYCERIN (NITROSTAT) 0.4 MG SL tablet Take one tablet every 5 minutes as needed for chest pains up to 3 tablets. ?Patient taking differently: Place 0.4 mg under the tongue every 5 (five) minutes as needed for chest pain (every 5 minutes as needed for chest pains up to 3 tablets.). 10/03/20   Fay Records, MD  ?senna-docusate (SENOKOT-S) 8.6-50 MG tablet Take 1 tablet by mouth at bedtime as needed for  mild constipation. ?Patient not taking: Reported on 06/16/2021 02/05/20   Damita Lack, MD  ?simvastatin (ZOCOR) 80 MG tablet Take 1 tablet (80 mg total) by mouth daily. ?Patient not taking: R

## 2021-06-17 DIAGNOSIS — E1165 Type 2 diabetes mellitus with hyperglycemia: Secondary | ICD-10-CM | POA: Diagnosis not present

## 2021-06-17 DIAGNOSIS — Z794 Long term (current) use of insulin: Secondary | ICD-10-CM | POA: Diagnosis not present

## 2021-06-17 DIAGNOSIS — L03115 Cellulitis of right lower limb: Secondary | ICD-10-CM | POA: Diagnosis not present

## 2021-06-17 LAB — CBC
HCT: 34.6 % — ABNORMAL LOW (ref 36.0–46.0)
Hemoglobin: 11 g/dL — ABNORMAL LOW (ref 12.0–15.0)
MCH: 30.2 pg (ref 26.0–34.0)
MCHC: 31.8 g/dL (ref 30.0–36.0)
MCV: 95.1 fL (ref 80.0–100.0)
Platelets: 190 10*3/uL (ref 150–400)
RBC: 3.64 MIL/uL — ABNORMAL LOW (ref 3.87–5.11)
RDW: 14.5 % (ref 11.5–15.5)
WBC: 8.9 10*3/uL (ref 4.0–10.5)
nRBC: 0 % (ref 0.0–0.2)

## 2021-06-17 LAB — BASIC METABOLIC PANEL
Anion gap: 7 (ref 5–15)
BUN: 16 mg/dL (ref 6–20)
CO2: 29 mmol/L (ref 22–32)
Calcium: 8.9 mg/dL (ref 8.9–10.3)
Chloride: 104 mmol/L (ref 98–111)
Creatinine, Ser: 0.82 mg/dL (ref 0.44–1.00)
GFR, Estimated: >60 mL/min (ref >60)
Glucose, Bld: 314 mg/dL — ABNORMAL HIGH (ref 70–99)
Potassium: 4 mmol/L (ref 3.5–5.1)
Sodium: 140 mmol/L (ref 135–145)

## 2021-06-17 LAB — GLUCOSE, CAPILLARY
Glucose-Capillary: 282 mg/dL — ABNORMAL HIGH (ref 70–99)
Glucose-Capillary: 342 mg/dL — ABNORMAL HIGH (ref 70–99)
Glucose-Capillary: 233 mg/dL — ABNORMAL HIGH (ref 70–99)
Glucose-Capillary: 303 mg/dL — ABNORMAL HIGH (ref 70–99)

## 2021-06-17 LAB — HEMOGLOBIN A1C
Hgb A1c MFr Bld: 13.1 % — ABNORMAL HIGH (ref 4.8–5.6)
Mean Plasma Glucose: 329.27 mg/dL

## 2021-06-17 LAB — LACTIC ACID, PLASMA: Lactic Acid, Venous: 2 mmol/L (ref 0.5–1.9)

## 2021-06-17 LAB — HIV ANTIBODY (ROUTINE TESTING W REFLEX): HIV Screen 4th Generation wRfx: NONREACTIVE

## 2021-06-17 MED ORDER — PROCHLORPERAZINE EDISYLATE 10 MG/2ML IJ SOLN
10.0000 mg | Freq: Four times a day (QID) | INTRAMUSCULAR | Status: DC | PRN
  Administered 2021-06-17: 10 mg via INTRAVENOUS
  Filled 2021-06-17: qty 2

## 2021-06-17 MED ORDER — INSULIN ASPART 100 UNIT/ML IJ SOLN
12.0000 [IU] | Freq: Three times a day (TID) | INTRAMUSCULAR | Status: DC
  Administered 2021-06-17 – 2021-06-18 (×2): 12 [IU] via SUBCUTANEOUS

## 2021-06-17 MED ORDER — LIVING WELL WITH DIABETES BOOK: Freq: Once | Status: AC

## 2021-06-17 MED ORDER — NYSTATIN 100000 UNIT/GM EX POWD
Freq: Two times a day (BID) | CUTANEOUS | Status: DC
  Filled 2021-06-17 (×2): qty 15

## 2021-06-17 NOTE — Assessment & Plan Note (Signed)
-   no s/s exac - hold lasix 

## 2021-06-17 NOTE — Plan of Care (Signed)

## 2021-06-17 NOTE — Care Management Obs Status (Signed)
MEDICARE OBSERVATION STATUS NOTIFICATION ? ? ?Patient Details  ?Name: Nicole [name] ?MRN: [id number] ?Date of Birth: June 14, 1965 ? ? ?Medicare Observation Status Notification Given:  Yes ? ? ? ?Corey Harold ?06/17/2021, 4:28 PM ?

## 2021-06-17 NOTE — Progress Notes (Signed)
Patient alert and verbal. Reported some complaints of pain and nausea this shift. Patient reported pain to right lower leg, PRN given for pain, see MAR. Changed dressing to right lower leg per wound care order. Noted patient ambulating self from bed to wheelchair then to bathroom with no assist. Patient stated she does this herself at home. Noted redness to abdominal folds. MD Girguis aware. New orders placed.  ?

## 2021-06-17 NOTE — Progress Notes (Signed)
?Progress Note ? ? ? ?Nicole [name]   ?[id number]: 10-Jan-1966  ?DOA: 06/16/2021     0 ?PCP: Celene Squibb, MD ? ?Initial CC: RLE pain ? ?Hospital Course: ?Nicole [name] is a 56 year old female with PMH poorly controlled DMII, morbid obesity, CAD, HLD, GERD, anxiety who presented with erythema, pain, and swelling of her right lower extremity.  Symptoms had been progressing for approximately 5 days prior to admission after she developed a blister on her right shin.  She states after rupturing it continued to have intermittent bleeding. ?Due to worsening pain and redness, she presented for further evaluation. ?She has been on no antibiotics outpatient. ?X-ray revealed no acute findings.  She was started on vancomycin and admitted for further monitoring. ? ?Interval History:  ?No events overnight.  Still having pain in her right leg.  We discussed possibly going home today and she endorsed being uncomfortable with that plan and did not feel ready.  Pain still present in right lower extremity.  Appetite also poor and not eating 50% of her meals yet. ? ?Assessment and Plan: ?* Cellulitis of leg, right ?- low concern for abscess at this time; patient did describe a "blister" at home prior to admission, but appearance now is moreso excoriated skin and wound is not deep ?-Unable to express any purulent fluid on exam; edema appreciated but no fluctuance ?-Continue vancomycin ?-Apply Vaseline dressing and cover.  Change daily ? ?Uncontrolled type 2 diabetes mellitus with hyperglycemia, with long-term current use of insulin ([place]) ?- Poorly controlled ?-A1c on admission, 13.1% ?-Patient on U-500 with frequently missed doses at home ?-[facility] and will adjust as necessary ?- Continue SSI and CBG monitoring ? ?Chronic respiratory failure with hypoxia (HCC) ?She is chronically on 3 L nasal cannula ? ?Morbid obesity with body mass index (BMI) of 50.0 to 59.9 in adult [facility]) ?- Complicates overall prognosis and care ?-  Body mass index is 50.03 kg/m?. ?- Weight Loss and Dietary Counseling given ? ?COPD (chronic obstructive pulmonary disease) (HCC) ?- no s/s exac ? ?HTN (hypertension), benign ?Continue metoprolol and lisinopril ? ?Dyslipidemia ?Continue Crestor ? ?Coronary atherosclerosis of native coronary artery/Stent in 2006 ?No chest pain presently ?Continue Plavix ? ?Lactic acidosis-resolved as of 06/17/2021 ?Patient is afebrile hemodynamically stable without any tachycardia ?Sepsis ruled out ?- cleared with IVF ? ? ? ?Old records reviewed in assessment of this patient ? ?Antimicrobials: ?Vanc 4/25 >> current  ? ?DVT prophylaxis:  ? ?  Lovenox ? ?Code Status:   Code Status: Full Code ? ?Disposition Plan: Home in 1 to 2 days ?Status is: Observation ? ?Objective: ?Blood pressure (!) 149/76, pulse 90, temperature 98.6 ?F (37 ?C), resp. rate (!) 21, height 5\' 4"  (1.626 m), weight 132.2 kg, SpO2 100 %.  ?Examination:  ?Physical Exam ?Constitutional:   ?   Comments: Pleasant morbidly obese woman in NAD  ?HENT:  ?   Head: Normocephalic and atraumatic.  ?   Mouth/Throat:  ?   Mouth: Mucous membranes are moist.  ?Eyes:  ?   Extraocular Movements: Extraocular movements intact.  ?Cardiovascular:  ?   Rate and Rhythm: Normal rate and regular rhythm.  ?Pulmonary:  ?   Effort: Pulmonary effort is normal.  ?   Breath sounds: Normal breath sounds.  ?Abdominal:  ?   General: Bowel sounds are normal. There is no distension.  ?   Palpations: Abdomen is soft.  ?   Tenderness: There is no abdominal tenderness.  ?  Comments: obese  ?Musculoskeletal:  ?   Cervical back: Normal range of motion and neck supple.  ?   Comments: RLE noted with erythema over shin with slightly improved erythema inside the skin marked boundary; excoriated skin in center with bleeding noted after removal of dressing. There is mild TTP. 1+ edema appreciated. No purulent drainage   ?Skin: ?   General: Skin is warm and dry.  ?Neurological:  ?   General: No focal deficit  present.  ?Psychiatric:     ?   Mood and Affect: Mood normal.     ?   Behavior: Behavior normal.  ?  ? ?Consultants:  ? ? ?Procedures:  ? ? ?Data Reviewed: ?Results for orders placed or performed during the hospital encounter of 06/16/21 (from the past 24 hour(s))  ?Glucose, capillary     Status: Abnormal  ? Collection Time: 06/16/21  4:20 PM  ?Result Value Ref Range  ? Glucose-Capillary 313 (H) 70 - 99 mg/dL  ? Comment 1 Notify RN   ? Comment 2 Document in Chart   ?Urinalysis, Complete w Microscopic     Status: Abnormal  ? Collection Time: 06/16/21  4:56 PM  ?Result Value Ref Range  ? Color, Urine STRAW (A) YELLOW  ? APPearance CLEAR CLEAR  ? Specific Gravity, Urine 1.020 1.005 - 1.030  ? pH 6.0 5.0 - 8.0  ? Glucose, UA >=500 (A) NEGATIVE mg/dL  ? Hgb urine dipstick NEGATIVE NEGATIVE  ? Bilirubin Urine NEGATIVE NEGATIVE  ? Ketones, ur NEGATIVE NEGATIVE mg/dL  ? Protein, ur 100 (A) NEGATIVE mg/dL  ? Nitrite NEGATIVE NEGATIVE  ? Leukocytes,Ua SMALL (A) NEGATIVE  ? RBC / HPF 0-5 0 - 5 RBC/hpf  ? WBC, UA 0-5 0 - 5 WBC/hpf  ? Bacteria, UA RARE (A) NONE SEEN  ? Squamous Epithelial / LPF 0-5 0 - 5  ?Rapid urine drug screen (hospital performed)     Status: Abnormal  ? Collection Time: 06/16/21  4:56 PM  ?Result Value Ref Range  ? Opiates POSITIVE (A) NONE DETECTED  ? Cocaine NONE DETECTED NONE DETECTED  ? Benzodiazepines NONE DETECTED NONE DETECTED  ? Amphetamines NONE DETECTED NONE DETECTED  ? Tetrahydrocannabinol NONE DETECTED NONE DETECTED  ? Barbiturates NONE DETECTED NONE DETECTED  ?Glucose, capillary     Status: Abnormal  ? Collection Time: 06/16/21  9:44 PM  ?Result Value Ref Range  ? Glucose-Capillary 364 (H) 70 - 99 mg/dL  ?Hemoglobin A1c     Status: Abnormal  ? Collection Time: 06/17/21  5:12 AM  ?Result Value Ref Range  ? Hgb A1c MFr Bld 13.1 (H) 4.8 - 5.6 %  ? Mean Plasma Glucose 329.27 mg/dL  ?HIV Antibody (routine testing w rflx)     Status: None  ? Collection Time: 06/17/21  5:12 AM  ?Result Value Ref Range   ? HIV Screen 4th Generation wRfx Non Reactive Non Reactive  ?CBC     Status: Abnormal  ? Collection Time: 06/17/21  5:12 AM  ?Result Value Ref Range  ? WBC 8.9 4.0 - 10.5 K/uL  ? RBC 3.64 (L) 3.87 - 5.11 MIL/uL  ? Hemoglobin 11.0 (L) 12.0 - 15.0 g/dL  ? HCT 34.6 (L) 36.0 - 46.0 %  ? MCV 95.1 80.0 - 100.0 fL  ? MCH 30.2 26.0 - 34.0 pg  ? MCHC 31.8 30.0 - 36.0 g/dL  ? RDW 14.5 11.5 - 15.5 %  ? Platelets 190 150 - 400 K/uL  ? nRBC 0.0 0.0 - 0.2 %  ?  Lactic acid, plasma     Status: Abnormal  ? Collection Time: 06/17/21  5:12 AM  ?Result Value Ref Range  ? Lactic Acid, Venous 2.0 (HH) 0.5 - 1.9 mmol/L  ?Basic metabolic panel     Status: Abnormal  ? Collection Time: 06/17/21  5:12 AM  ?Result Value Ref Range  ? Sodium 140 135 - 145 mmol/L  ? Potassium 4.0 3.5 - 5.1 mmol/L  ? Chloride 104 98 - 111 mmol/L  ? CO2 29 22 - 32 mmol/L  ? Glucose, Bld 314 (H) 70 - 99 mg/dL  ? BUN 16 6 - 20 mg/dL  ? Creatinine, Ser 0.82 0.44 - 1.00 mg/dL  ? Calcium 8.9 8.9 - 10.3 mg/dL  ? GFR, Estimated >60 >60 mL/min  ? Anion gap 7 5 - 15  ?Glucose, capillary     Status: Abnormal  ? Collection Time: 06/17/21  7:55 AM  ?Result Value Ref Range  ? Glucose-Capillary 282 (H) 70 - 99 mg/dL  ?Glucose, capillary     Status: Abnormal  ? Collection Time: 06/17/21 11:34 AM  ?Result Value Ref Range  ? Glucose-Capillary 342 (H) 70 - 99 mg/dL  ?  ?I have Reviewed nursing notes, Vitals, and Lab results since pt's last encounter. Pertinent lab results : see above ?I have ordered test including BMP, CBC, Mg ?I have reviewed the last note from staff over past 24 hours ?I have discussed pt's care plan and test results with nursing staff, case manager ? ? LOS: 0 days  ? ?Dwyane Dee, MD ?[facility] ?06/17/2021, 12:21 PM ? ?

## 2021-06-17 NOTE — Progress Notes (Signed)
Patient using hospital CPAP independently. Told patient to call if she needed help with anything. Patient displays understanding of how to put machine on and turn on. Made sure machine settings were correct and that it was plugged into red outlet. ?

## 2021-06-17 NOTE — Progress Notes (Addendum)
Inpatient Diabetes Program Recommendations ? ?AACE/ADA: New Consensus Statement on Inpatient Glycemic Control (2015) ? ?Target Ranges:  Prepandial:   less than 140 mg/dL ?     Peak postprandial:   less than 180 mg/dL (1-2 hours) ?     Critically ill patients:  140 - 180 mg/dL  ? ?Lab Results  ?Component Value Date  ? GLUCAP 342 (H) 06/17/2021  ? HGBA1C 13.1 (H) 06/17/2021  ? ? ?Review of Glycemic Control ? Latest Reference Range & Units 06/16/21 16:20 06/16/21 21:44 06/17/21 07:55 06/17/21 11:34  ?Glucose-Capillary 70 - 99 mg/dL 485 (H) 462 (H) 703 (H) 342 (H)  ?(H): Data is abnormally high ? ?Diabetes history: DM2 ?Outpatient Diabetes medications: Glipizide 10 mg BID, u-500 80 units TID, Trulicity 1.5 mg qwk ?Current orders for Inpatient glycemic control: Semglee 80 units bid, Novolog 5 units tid meal coverage, Novolog 0-20 units tid, 0-5 units hs ? ?Inpatient Diabetes Program Recommendations:   ?CBGs continue to be elevated or change from Lantus to U 500 tid ?-Increase Novolog 20 units tid meal coverage  ? ?2:00 pm Spoke with patient via phone (DM coordinator @ [facility]). Reviewed with patient A1c 13.1 (average blood glucose approximately 329 over the past 2-3 months). Patient states she is busy @ this time and requests me to call back tomorrow. ? ?Thank you, ?Billy Fischer Roberts Bon, RN, MSN, CDE  ?Diabetes Coordinator ?Inpatient Glycemic Control Team ?Team Pager [phone] (8am-5pm) ?06/17/2021 12:26 PM ? ? ? ? ? ?

## 2021-06-17 NOTE — Progress Notes (Signed)
?  Transition of Care (TOC) Screening Note ? ? ?Patient Details  ?Name: Nicole [name] ?Date of Birth: [birth year] ? ? ?Transition of Care (TOC) CM/SW Contact:    ?Aubrei Bouchie D, LCSW ?Phone Number: ?06/17/2021, 1:56 PM ? ? ? ?Transition of Care Department [facility]) has reviewed patient and no TOC needs have been identified at this time. We will continue to monitor patient advancement through interdisciplinary progression rounds. If new patient transition needs arise, please place a TOC consult. ? ? ?

## 2021-06-18 DIAGNOSIS — Z886 Allergy status to analgesic agent status: Secondary | ICD-10-CM | POA: Diagnosis not present

## 2021-06-18 DIAGNOSIS — Z833 Family history of diabetes mellitus: Secondary | ICD-10-CM | POA: Diagnosis not present

## 2021-06-18 DIAGNOSIS — K219 Gastro-esophageal reflux disease without esophagitis: Secondary | ICD-10-CM | POA: Diagnosis present

## 2021-06-18 DIAGNOSIS — J9611 Chronic respiratory failure with hypoxia: Secondary | ICD-10-CM | POA: Diagnosis present

## 2021-06-18 DIAGNOSIS — Z8041 Family history of malignant neoplasm of ovary: Secondary | ICD-10-CM | POA: Diagnosis not present

## 2021-06-18 DIAGNOSIS — L02415 Cutaneous abscess of right lower limb: Secondary | ICD-10-CM | POA: Diagnosis present

## 2021-06-18 DIAGNOSIS — E872 Acidosis, unspecified: Secondary | ICD-10-CM | POA: Diagnosis present

## 2021-06-18 DIAGNOSIS — L03115 Cellulitis of right lower limb: Secondary | ICD-10-CM | POA: Diagnosis not present

## 2021-06-18 DIAGNOSIS — Z841 Family history of disorders of kidney and ureter: Secondary | ICD-10-CM | POA: Diagnosis not present

## 2021-06-18 DIAGNOSIS — Z6841 Body Mass Index (BMI) 40.0 and over, adult: Secondary | ICD-10-CM | POA: Diagnosis not present

## 2021-06-18 DIAGNOSIS — Z825 Family history of asthma and other chronic lower respiratory diseases: Secondary | ICD-10-CM | POA: Diagnosis not present

## 2021-06-18 DIAGNOSIS — I2510 Atherosclerotic heart disease of native coronary artery without angina pectoris: Secondary | ICD-10-CM | POA: Diagnosis present

## 2021-06-18 DIAGNOSIS — E1165 Type 2 diabetes mellitus with hyperglycemia: Secondary | ICD-10-CM | POA: Diagnosis not present

## 2021-06-18 DIAGNOSIS — I10 Essential (primary) hypertension: Secondary | ICD-10-CM | POA: Diagnosis present

## 2021-06-18 DIAGNOSIS — E785 Hyperlipidemia, unspecified: Secondary | ICD-10-CM | POA: Diagnosis present

## 2021-06-18 DIAGNOSIS — F419 Anxiety disorder, unspecified: Secondary | ICD-10-CM | POA: Diagnosis present

## 2021-06-18 DIAGNOSIS — Z89512 Acquired absence of left leg below knee: Secondary | ICD-10-CM | POA: Diagnosis not present

## 2021-06-18 DIAGNOSIS — Z8249 Family history of ischemic heart disease and other diseases of the circulatory system: Secondary | ICD-10-CM | POA: Diagnosis not present

## 2021-06-18 DIAGNOSIS — Z794 Long term (current) use of insulin: Secondary | ICD-10-CM | POA: Diagnosis not present

## 2021-06-18 DIAGNOSIS — Z955 Presence of coronary angioplasty implant and graft: Secondary | ICD-10-CM | POA: Diagnosis not present

## 2021-06-18 DIAGNOSIS — Z87891 Personal history of nicotine dependence: Secondary | ICD-10-CM | POA: Diagnosis not present

## 2021-06-18 DIAGNOSIS — J449 Chronic obstructive pulmonary disease, unspecified: Secondary | ICD-10-CM | POA: Diagnosis not present

## 2021-06-18 DIAGNOSIS — Z7902 Long term (current) use of antithrombotics/antiplatelets: Secondary | ICD-10-CM | POA: Diagnosis not present

## 2021-06-18 LAB — CBC WITH DIFFERENTIAL/PLATELET
Abs Immature Granulocytes: 0.04 10*3/uL (ref 0.00–0.07)
Basophils Absolute: 0.1 10*3/uL (ref 0.0–0.1)
Basophils Relative: 1 %
Eosinophils Absolute: 0.3 10*3/uL (ref 0.0–0.5)
Eosinophils Relative: 4 %
HCT: 36.1 % (ref 36.0–46.0)
Hemoglobin: 11.4 g/dL — ABNORMAL LOW (ref 12.0–15.0)
Immature Granulocytes: 1 %
Lymphocytes Relative: 34 %
Lymphs Abs: 3 10*3/uL (ref 0.7–4.0)
MCH: 30.4 pg (ref 26.0–34.0)
MCHC: 31.6 g/dL (ref 30.0–36.0)
MCV: 96.3 fL (ref 80.0–100.0)
Monocytes Absolute: 0.9 10*3/uL (ref 0.1–1.0)
Monocytes Relative: 10 %
Neutro Abs: 4.5 10*3/uL (ref 1.7–7.7)
Neutrophils Relative %: 50 %
Platelets: 187 10*3/uL (ref 150–400)
RBC: 3.75 MIL/uL — ABNORMAL LOW (ref 3.87–5.11)
RDW: 14.3 % (ref 11.5–15.5)
WBC: 8.8 10*3/uL (ref 4.0–10.5)
nRBC: 0 % (ref 0.0–0.2)

## 2021-06-18 LAB — BASIC METABOLIC PANEL
Anion gap: 6 (ref 5–15)
BUN: 13 mg/dL (ref 6–20)
CO2: 29 mmol/L (ref 22–32)
Calcium: 9.3 mg/dL (ref 8.9–10.3)
Chloride: 105 mmol/L (ref 98–111)
Creatinine, Ser: 0.83 mg/dL (ref 0.44–1.00)
GFR, Estimated: >60 mL/min (ref >60)
Glucose, Bld: 249 mg/dL — ABNORMAL HIGH (ref 70–99)
Potassium: 4.2 mmol/L (ref 3.5–5.1)
Sodium: 140 mmol/L (ref 135–145)

## 2021-06-18 LAB — GLUCOSE, CAPILLARY
Glucose-Capillary: 238 mg/dL — ABNORMAL HIGH (ref 70–99)
Glucose-Capillary: 240 mg/dL — ABNORMAL HIGH (ref 70–99)
Glucose-Capillary: 112 mg/dL — ABNORMAL HIGH (ref 70–99)
Glucose-Capillary: 112 mg/dL — ABNORMAL HIGH (ref 70–99)

## 2021-06-18 LAB — MAGNESIUM: Magnesium: 1.6 mg/dL — ABNORMAL LOW (ref 1.7–2.4)

## 2021-06-18 MED ORDER — INSULIN REGULAR HUMAN 100 UNIT/ML IJ SOLN
80.0000 [IU] | Freq: Three times a day (TID) | INTRAMUSCULAR | Status: DC
  Administered 2021-06-18 – 2021-06-19 (×3): 80 [IU] via SUBCUTANEOUS
  Filled 2021-06-18: qty 3

## 2021-06-18 MED ORDER — DULOXETINE HCL 60 MG PO CPEP
60.0000 mg | ORAL_CAPSULE | Freq: Every day | ORAL | Status: DC
Start: 2021-06-18 — End: 2021-06-20
  Administered 2021-06-18 – 2021-06-20 (×3): 60 mg via ORAL
  Filled 2021-06-18 (×3): qty 1

## 2021-06-18 MED ORDER — INSULIN REGULAR HUMAN (CONC) 500 UNIT/ML ~~LOC~~ SOPN: 80.0000 [IU] | PEN_INJECTOR | Freq: Three times a day (TID) | SUBCUTANEOUS | Status: DC

## 2021-06-18 MED ORDER — BUPROPION HCL ER (SR) 100 MG PO TB12
100.0000 mg | ORAL_TABLET | Freq: Two times a day (BID) | ORAL | Status: DC
Start: 2021-06-18 — End: 2021-06-20
  Administered 2021-06-18 – 2021-06-20 (×5): 100 mg via ORAL
  Filled 2021-06-18 (×5): qty 1

## 2021-06-18 NOTE — Progress Notes (Signed)
?Progress Note ? ? ? ?Nicole [name]   ?[id number]: 12/07/1965  ?DOA: 06/16/2021     0 ?PCP: Nicole Squibb, MD ? ?Initial CC: RLE pain ? ?Hospital Course: ?Nicole [name] is a 56 year old female with PMH poorly controlled DMII, morbid obesity, CAD, HLD, GERD, anxiety who presented with erythema, pain, and swelling of her right lower extremity.  Symptoms had been progressing for approximately 5 days prior to admission after she developed a blister on her right shin.  She states after rupturing it continued to have intermittent bleeding. ?Due to worsening pain and redness, she presented for further evaluation. ?She has been on no antibiotics outpatient. ?X-ray revealed no acute findings.  She was started on vancomycin and admitted for further monitoring. ? ?Interval History:  ?No events overnight.  Pain present but improving. Erythema improving further too. No help at home to return home today and would not be a safe discharge.  ? ?Assessment and Plan: ?* Cellulitis of leg, right ?- low concern for abscess at this time; patient did describe a "blister" at home prior to admission, but appearance now is moreso excoriated skin and wound is not deep ?- erythema continues to improve  ?-Unable to express any purulent fluid on exam; edema appreciated but no fluctuance ?-Continue vancomycin ?-Apply Vaseline dressing and cover.  Change daily ? ?Uncontrolled type 2 diabetes mellitus with hyperglycemia, with long-term current use of insulin (Nicole [name]) ?- Poorly controlled ?-A1c on admission, 13.1% ?-Patient on U-500 with frequently missed doses at home ?- d/c semglee and convert back to Humulin ?- Continue SSI and CBG monitoring ? ?Chronic respiratory failure with hypoxia (HCC) ?She is chronically on 3 L nasal cannula ? ?Morbid obesity with body mass index (BMI) of 50.0 to 59.9 in adult [facility]) ?- Complicates overall prognosis and care ?- Body mass index is 50.03 kg/m?. ?- Weight Loss and Dietary Counseling given ? ?COPD (chronic  obstructive pulmonary disease) (HCC) ?- no s/s exac ? ?HTN (hypertension), benign ?Continue metoprolol and lisinopril ? ?Dyslipidemia ?Continue Crestor ? ?Coronary atherosclerosis of native coronary artery/Stent in 2006 ?No chest pain presently ?Continue Plavix ? ?Lactic acidosis-resolved as of 06/17/2021 ?Patient is afebrile hemodynamically stable without any tachycardia ?Sepsis ruled out ?- cleared with IVF ? ? ? ?Old records reviewed in assessment of this patient ? ?Antimicrobials: ?Vanc 4/25 >> current  ? ?DVT prophylaxis:  ? ?  Lovenox ? ?Code Status:   Code Status: Full Code ? ?Disposition Plan: Home Friday ?Status is: Observation ? ?Objective: ?Blood pressure (!) 130/43, pulse 89, temperature 98 ?F (36.7 ?C), resp. rate 20, height 5\' 4"  (1.626 m), weight 132.2 kg, SpO2 100 %.  ?Examination:  ?Physical Exam ?Constitutional:   ?   Comments: Pleasant morbidly obese woman in NAD  ?HENT:  ?   Head: Normocephalic and atraumatic.  ?   Mouth/Throat:  ?   Mouth: Mucous membranes are moist.  ?Eyes:  ?   Extraocular Movements: Extraocular movements intact.  ?Cardiovascular:  ?   Rate and Rhythm: Normal rate and regular rhythm.  ?Pulmonary:  ?   Effort: Pulmonary effort is normal.  ?   Breath sounds: Normal breath sounds.  ?Abdominal:  ?   General: Bowel sounds are normal. There is no distension.  ?   Palpations: Abdomen is soft.  ?   Tenderness: There is no abdominal tenderness.  ?   Comments: obese  ?Musculoskeletal:  ?   Cervical back: Normal range of motion and neck supple.  ?  Comments: RLE noted with erythema over shin with slightly improved erythema inside the skin marked boundary; excoriated skin in center. There is mild TTP. 1+ edema appreciated. No purulent drainage   ?Skin: ?   General: Skin is warm and dry.  ?Neurological:  ?   General: No focal deficit present.  ?Psychiatric:     ?   Mood and Affect: Mood normal.     ?   Behavior: Behavior normal.  ?  ? ?Consultants:  ? ? ?Procedures:  ? ? ?Data  Reviewed: ?Results for orders placed or performed during the hospital encounter of 06/16/21 (from the past 24 hour(s))  ?Glucose, capillary     Status: Abnormal  ? Collection Time: 06/17/21 11:34 AM  ?Result Value Ref Range  ? Glucose-Capillary 342 (H) 70 - 99 mg/dL  ?Glucose, capillary     Status: Abnormal  ? Collection Time: 06/17/21  4:33 PM  ?Result Value Ref Range  ? Glucose-Capillary 233 (H) 70 - 99 mg/dL  ?Glucose, capillary     Status: Abnormal  ? Collection Time: 06/17/21 10:00 PM  ?Result Value Ref Range  ? Glucose-Capillary 303 (H) 70 - 99 mg/dL  ?Basic metabolic panel     Status: Abnormal  ? Collection Time: 06/18/21  4:33 AM  ?Result Value Ref Range  ? Sodium 140 135 - 145 mmol/L  ? Potassium 4.2 3.5 - 5.1 mmol/L  ? Chloride 105 98 - 111 mmol/L  ? CO2 29 22 - 32 mmol/L  ? Glucose, Bld 249 (H) 70 - 99 mg/dL  ? BUN 13 6 - 20 mg/dL  ? Creatinine, Ser 0.83 0.44 - 1.00 mg/dL  ? Calcium 9.3 8.9 - 10.3 mg/dL  ? GFR, Estimated >60 >60 mL/min  ? Anion gap 6 5 - 15  ?CBC with Differential/Platelet     Status: Abnormal  ? Collection Time: 06/18/21  4:33 AM  ?Result Value Ref Range  ? WBC 8.8 4.0 - 10.5 K/uL  ? RBC 3.75 (L) 3.87 - 5.11 MIL/uL  ? Hemoglobin 11.4 (L) 12.0 - 15.0 g/dL  ? HCT 36.1 36.0 - 46.0 %  ? MCV 96.3 80.0 - 100.0 fL  ? MCH 30.4 26.0 - 34.0 pg  ? MCHC 31.6 30.0 - 36.0 g/dL  ? RDW 14.3 11.5 - 15.5 %  ? Platelets 187 150 - 400 K/uL  ? nRBC 0.0 0.0 - 0.2 %  ? Neutrophils Relative % 50 %  ? Neutro Abs 4.5 1.7 - 7.7 K/uL  ? Lymphocytes Relative 34 %  ? Lymphs Abs 3.0 0.7 - 4.0 K/uL  ? Monocytes Relative 10 %  ? Monocytes Absolute 0.9 0.1 - 1.0 K/uL  ? Eosinophils Relative 4 %  ? Eosinophils Absolute 0.3 0.0 - 0.5 K/uL  ? Basophils Relative 1 %  ? Basophils Absolute 0.1 0.0 - 0.1 K/uL  ? Immature Granulocytes 1 %  ? Abs Immature Granulocytes 0.04 0.00 - 0.07 K/uL  ?Magnesium     Status: Abnormal  ? Collection Time: 06/18/21  4:33 AM  ?Result Value Ref Range  ? Magnesium 1.6 (L) 1.7 - 2.4 mg/dL   ?Glucose, capillary     Status: Abnormal  ? Collection Time: 06/18/21  7:55 AM  ?Result Value Ref Range  ? Glucose-Capillary 238 (H) 70 - 99 mg/dL  ?  ?I have Reviewed nursing notes, Vitals, and Lab results since pt's last encounter. Pertinent lab results : see above ?I have ordered test including BMP, CBC, Mg ?I have reviewed the last note  from staff over past 24 hours ?I have discussed pt's care plan and test results with nursing staff, case manager ? ? LOS: 0 days  ? ?Dwyane Dee, MD ?[facility] ?06/18/2021, 9:43 AM ? ?

## 2021-06-18 NOTE — Progress Notes (Signed)
Patient using hospital CPAP independently. Told patient to call RT if she needed any help. ?

## 2021-06-18 NOTE — Progress Notes (Signed)
Inpatient Diabetes Program Recommendations ? ?AACE/ADA: New Consensus Statement on Inpatient Glycemic Control (2015) ? ?Target Ranges:  Prepandial:   less than 140 mg/dL ?     Peak postprandial:   less than 180 mg/dL (1-2 hours) ?     Critically ill patients:  140 - 180 mg/dL  ? ?Lab Results  ?Component Value Date  ? GLUCAP 112 (H) 06/18/2021  ? HGBA1C 13.1 (H) 06/17/2021  ? ? ?Review of Glycemic Control ? ?Diabetes history: DM2 ?Outpatient Diabetes medications: U-500 80 units TID, glipizide 10 BID, Trulicity 1.5 weekly ?Current orders for Inpatient glycemic control: U-500 80 units TID, Novolog 0-20 TID with meals and 0-5 HS ? ?HgbA1C - 13.1% ?CBGs: 238-240-112 ?Eating 100% ? ?Inpatient Diabetes Program Recommendations:   ? ?Agree with orders. ? ?Spoke with pt on phone about her diabetes control at home, following up Diabetes Coordinator conversation on 4/26. Pt states she lives with her daughter and eats a lot of pasta, rice, potatoes and green beans (daughter prepares food). Discussed portion control and trying to eat a variety of foods. Pt said this is hard d/t very little money available. No exercise and wheelchair bound. Pt said she sometimes skips her insulin doses if she doesn't eat. Has not seen Endo in several months and plans to make f/u appt when discharged. Stressed importance of eating and taking insulin as prescribed. Pt states she doesn't want to take insulin if not eating, as she may have low blood sugars. Discussed hypoglycemia s/s and treatment. Pt understands she needs to eat healthier and "take care of herself." ? ?Continue to follow. ? ?Thank you. ?Ailene Ards, RD, LDN, CDE ?Inpatient Diabetes Coordinator ?[phone]  ? ? ? ? ?

## 2021-06-19 LAB — CBC WITH DIFFERENTIAL/PLATELET
Abs Immature Granulocytes: 0.04 10*3/uL (ref 0.00–0.07)
Basophils Absolute: 0.1 10*3/uL (ref 0.0–0.1)
Basophils Relative: 1 %
Eosinophils Absolute: 0.4 10*3/uL (ref 0.0–0.5)
Eosinophils Relative: 4 %
HCT: 35.2 % — ABNORMAL LOW (ref 36.0–46.0)
Hemoglobin: 11.1 g/dL — ABNORMAL LOW (ref 12.0–15.0)
Immature Granulocytes: 0 %
Lymphocytes Relative: 31 %
Lymphs Abs: 2.9 10*3/uL (ref 0.7–4.0)
MCH: 30.2 pg (ref 26.0–34.0)
MCHC: 31.5 g/dL (ref 30.0–36.0)
MCV: 95.9 fL (ref 80.0–100.0)
Monocytes Absolute: 0.8 10*3/uL (ref 0.1–1.0)
Monocytes Relative: 8 %
Neutro Abs: 5.1 10*3/uL (ref 1.7–7.7)
Neutrophils Relative %: 56 %
Platelets: 196 10*3/uL (ref 150–400)
RBC: 3.67 MIL/uL — ABNORMAL LOW (ref 3.87–5.11)
RDW: 14.1 % (ref 11.5–15.5)
WBC: 9.3 10*3/uL (ref 4.0–10.5)
nRBC: 0 % (ref 0.0–0.2)

## 2021-06-19 LAB — GLUCOSE, CAPILLARY
Glucose-Capillary: 69 mg/dL — ABNORMAL LOW (ref 70–99)
Glucose-Capillary: 78 mg/dL (ref 70–99)
Glucose-Capillary: 132 mg/dL — ABNORMAL HIGH (ref 70–99)
Glucose-Capillary: 154 mg/dL — ABNORMAL HIGH (ref 70–99)
Glucose-Capillary: 202 mg/dL — ABNORMAL HIGH (ref 70–99)
Glucose-Capillary: 92 mg/dL (ref 70–99)
Glucose-Capillary: 194 mg/dL — ABNORMAL HIGH (ref 70–99)
Glucose-Capillary: 221 mg/dL — ABNORMAL HIGH (ref 70–99)

## 2021-06-19 LAB — BASIC METABOLIC PANEL
Anion gap: 7 (ref 5–15)
BUN: 14 mg/dL (ref 6–20)
CO2: 29 mmol/L (ref 22–32)
Calcium: 9 mg/dL (ref 8.9–10.3)
Chloride: 102 mmol/L (ref 98–111)
Creatinine, Ser: 0.87 mg/dL (ref 0.44–1.00)
GFR, Estimated: >60 mL/min (ref >60)
Glucose, Bld: 201 mg/dL — ABNORMAL HIGH (ref 70–99)
Potassium: 3.9 mmol/L (ref 3.5–5.1)
Sodium: 138 mmol/L (ref 135–145)

## 2021-06-19 LAB — MAGNESIUM: Magnesium: 1.5 mg/dL — ABNORMAL LOW (ref 1.7–2.4)

## 2021-06-19 MED ORDER — INSULIN REGULAR HUMAN 100 UNIT/ML IJ SOLN
30.0000 [IU] | Freq: Three times a day (TID) | INTRAMUSCULAR | Status: DC
  Administered 2021-06-19 (×2): 30 [IU] via SUBCUTANEOUS
  Filled 2021-06-19: qty 3

## 2021-06-19 MED ORDER — CEPHALEXIN 500 MG PO CAPS
500.0000 mg | ORAL_CAPSULE | Freq: Four times a day (QID) | ORAL | Status: DC
  Administered 2021-06-19 – 2021-06-20 (×4): 500 mg via ORAL
  Filled 2021-06-19 (×4): qty 1

## 2021-06-19 MED ORDER — ENOXAPARIN SODIUM 80 MG/0.8ML IJ SOSY
70.0000 mg | PREFILLED_SYRINGE | INTRAMUSCULAR | Status: DC
Start: 2021-06-19 — End: 2021-06-20
  Administered 2021-06-19: 70 mg via SUBCUTANEOUS
  Filled 2021-06-19: qty 0.8

## 2021-06-19 NOTE — Care Management Important Message (Signed)
Important Message ? ?Patient Details  ?Name: Nicole [name] ?MRN: [id number] ?Date of Birth: Sep 19, 1965 ? ? ?Medicare Important Message Given:  Yes ? ? ? ? ?Corey Harold ?06/19/2021, 1:30 PM ?

## 2021-06-19 NOTE — Progress Notes (Signed)
?Progress Note ? ? ? ?Nicole [name]   ?[id number]: Oct 26, 1965  ?DOA: 06/16/2021     1 ?PCP: Celene Squibb, MD ? ?Initial CC: RLE pain ? ?Hospital Course: ?Nicole [name] is a 56 year old female with PMH poorly controlled DMII, morbid obesity, CAD, HLD, GERD, anxiety who presented with erythema, pain, and swelling of her right lower extremity.  Symptoms had been progressing for approximately 5 days prior to admission after she developed a blister on her right shin.  She states after rupturing it continued to have intermittent bleeding. ?Due to worsening pain and redness, she presented for further evaluation. ?She has been on no antibiotics outpatient. ?X-ray revealed no acute findings.  She was started on vancomycin and admitted for further monitoring. ? ?Interval History:  ?Glucose down trended to 69 overnight but no treatment required and Nicole [name] was asymptomatic.  Regimen adjusted further this morning.  Discussed with Nicole [name] her administration techniques at home as well as dietary habits.  Tried reinforcing proper administration technique and the need for ongoing carb control at home after discharge. ? ?Assessment and Plan: ?* Cellulitis of leg, right ?- low concern for abscess at this time; Nicole [name] did describe a "blister" at home prior to admission, but appearance now is moreso excoriated skin and wound is not deep ?- erythema continues to improve  ?-Unable to express any purulent fluid on exam; edema appreciated but no fluctuance ?- okay to transition to keflex to complete 7 day course ?-Apply Vaseline dressing and cover.  Change daily ? ?Uncontrolled type 2 diabetes mellitus with hyperglycemia, with long-term current use of insulin ([place]) ?- Poorly controlled ?-A1c on admission, 13.1% ?-Nicole [name] on U-500 with frequently missed doses at home ?- d/c semglee and convert back to regular insulin ?- giving 80 units per dose seems to have dropped CBGs into 60s which raises concern for inappropriate  administration at home (scar tissue, etc) vs poor diet control given that her diet is more regulated in hospital; I've discussed these concerns with Nicole [name] and she does endorse a worse diet at home in general ?- decrease regular insulin to 30 units TIDWC (U500 is still regular insulin as the U100 being used in hospital; only difference is volume of dose which is insignificant essentially) ?- trend CBGs further and will adjust regimen as needed ? ?Chronic respiratory failure with hypoxia (HCC) ?She is chronically on 3 L nasal cannula ? ?Morbid obesity with body mass index (BMI) of 50.0 to 59.9 in adult [facility]) ?- Complicates overall prognosis and care ?- Body mass index is 50.03 kg/m?. ?- Weight Loss and Dietary Counseling given ? ?COPD (chronic obstructive pulmonary disease) (HCC) ?- no s/s exac ? ?HTN (hypertension), benign ?Continue metoprolol and lisinopril ? ?Dyslipidemia ?Continue Crestor ? ?Coronary atherosclerosis of native coronary artery/Stent in 2006 ?No chest pain presently ?Continue Plavix ? ?Lactic acidosis-resolved as of 06/17/2021 ?Nicole [name] is afebrile hemodynamically stable without any tachycardia ?Sepsis ruled out ?- cleared with IVF ? ? ? ?Old records reviewed in assessment of this Nicole [name] ? ?Antimicrobials: ?Vanc 4/25 >> 4/28 ?Keflex 4/28 >> current  ? ?DVT prophylaxis:  ? ?  Lovenox ? ?Code Status:   Code Status: Full Code ? ?Disposition Plan: Home Saturday ?Status is: Inpt ? ?Objective: ?Blood pressure (!) 107/39, pulse 90, temperature 98.3 ?F (36.8 ?C), temperature source Oral, resp. rate 18, height 5\' 4"  (1.626 m), weight 132.2 kg, SpO2 100 %.  ?Examination:  ?Physical Exam ?Constitutional:   ?   Comments: Pleasant morbidly obese woman in  NAD  ?HENT:  ?   Head: Normocephalic and atraumatic.  ?   Mouth/Throat:  ?   Mouth: Mucous membranes are moist.  ?Eyes:  ?   Extraocular Movements: Extraocular movements intact.  ?Cardiovascular:  ?   Rate and Rhythm: Normal rate and regular rhythm.  ?Pulmonary:   ?   Effort: Pulmonary effort is normal.  ?   Breath sounds: Normal breath sounds.  ?Abdominal:  ?   General: Bowel sounds are normal. There is no distension.  ?   Palpations: Abdomen is soft.  ?   Tenderness: There is no abdominal tenderness.  ?   Comments: obese  ?Musculoskeletal:  ?   Cervical back: Normal range of motion and neck supple.  ?   Comments: RLE noted with erythema over shin with slightly improved erythema inside the skin marked boundary; excoriated skin in center. There is mild TTP. 1+ edema appreciated. No purulent drainage   ?Skin: ?   General: Skin is warm and dry.  ?Neurological:  ?   General: No focal deficit present.  ?Psychiatric:     ?   Mood and Affect: Mood normal.     ?   Behavior: Behavior normal.  ?  ? ?Consultants:  ? ? ?Procedures:  ? ? ?Data Reviewed: ?Results for orders placed or performed during the hospital encounter of 06/16/21 (from the past 24 hour(s))  ?Glucose, capillary     Status: Abnormal  ? Collection Time: 06/18/21  8:31 PM  ?Result Value Ref Range  ? Glucose-Capillary 112 (H) 70 - 99 mg/dL  ?Glucose, capillary     Status: Abnormal  ? Collection Time: 06/19/21  1:35 AM  ?Result Value Ref Range  ? Glucose-Capillary 69 (L) 70 - 99 mg/dL  ?Glucose, capillary     Status: None  ? Collection Time: 06/19/21  1:58 AM  ?Result Value Ref Range  ? Glucose-Capillary 78 70 - 99 mg/dL  ?Glucose, capillary     Status: Abnormal  ? Collection Time: 06/19/21  2:44 AM  ?Result Value Ref Range  ? Glucose-Capillary 132 (H) 70 - 99 mg/dL  ?Basic metabolic panel     Status: Abnormal  ? Collection Time: 06/19/21  5:13 AM  ?Result Value Ref Range  ? Sodium 138 135 - 145 mmol/L  ? Potassium 3.9 3.5 - 5.1 mmol/L  ? Chloride 102 98 - 111 mmol/L  ? CO2 29 22 - 32 mmol/L  ? Glucose, Bld 201 (H) 70 - 99 mg/dL  ? BUN 14 6 - 20 mg/dL  ? Creatinine, Ser 0.87 0.44 - 1.00 mg/dL  ? Calcium 9.0 8.9 - 10.3 mg/dL  ? GFR, Estimated >60 >60 mL/min  ? Anion gap 7 5 - 15  ?CBC with Differential/Platelet     Status:  Abnormal  ? Collection Time: 06/19/21  5:13 AM  ?Result Value Ref Range  ? WBC 9.3 4.0 - 10.5 K/uL  ? RBC 3.67 (L) 3.87 - 5.11 MIL/uL  ? Hemoglobin 11.1 (L) 12.0 - 15.0 g/dL  ? HCT 35.2 (L) 36.0 - 46.0 %  ? MCV 95.9 80.0 - 100.0 fL  ? MCH 30.2 26.0 - 34.0 pg  ? MCHC 31.5 30.0 - 36.0 g/dL  ? RDW 14.1 11.5 - 15.5 %  ? Platelets 196 150 - 400 K/uL  ? nRBC 0.0 0.0 - 0.2 %  ? Neutrophils Relative % 56 %  ? Neutro Abs 5.1 1.7 - 7.7 K/uL  ? Lymphocytes Relative 31 %  ? Lymphs Abs 2.9 0.7 -  4.0 K/uL  ? Monocytes Relative 8 %  ? Monocytes Absolute 0.8 0.1 - 1.0 K/uL  ? Eosinophils Relative 4 %  ? Eosinophils Absolute 0.4 0.0 - 0.5 K/uL  ? Basophils Relative 1 %  ? Basophils Absolute 0.1 0.0 - 0.1 K/uL  ? Immature Granulocytes 0 %  ? Abs Immature Granulocytes 0.04 0.00 - 0.07 K/uL  ?Magnesium     Status: Abnormal  ? Collection Time: 06/19/21  5:13 AM  ?Result Value Ref Range  ? Magnesium 1.5 (L) 1.7 - 2.4 mg/dL  ?Glucose, capillary     Status: Abnormal  ? Collection Time: 06/19/21  8:35 AM  ?Result Value Ref Range  ? Glucose-Capillary 154 (H) 70 - 99 mg/dL  ?Glucose, capillary     Status: Abnormal  ? Collection Time: 06/19/21 11:41 AM  ?Result Value Ref Range  ? Glucose-Capillary 202 (H) 70 - 99 mg/dL  ?  ?I have Reviewed nursing notes, Vitals, and Lab results since pt's last encounter. Pertinent lab results : see above ?I have ordered test including BMP, CBC, Mg ?I have reviewed the last note from staff over past 24 hours ?I have discussed pt's care plan and test results with nursing staff, case manager ? ? LOS: 1 day  ? ?Dwyane Dee, MD ?[facility] ?06/19/2021, 5:30 PM ? ?

## 2021-06-19 NOTE — Progress Notes (Signed)
Inpatient Diabetes Program Recommendations ? ?AACE/ADA: New Consensus Statement on Inpatient Glycemic Control ? ?Target Ranges:  Prepandial:   less than 140 mg/dL ?     Peak postprandial:   less than 180 mg/dL (1-2 hours) ?     Critically ill patients:  140 - 180 mg/dL  ? ? Latest Reference Range & Units 06/19/21 01:35 06/19/21 01:58 06/19/21 02:44  ?Glucose-Capillary 70 - 99 mg/dL 69 (L) 78 782 (H)  ? ? Latest Reference Range & Units 06/18/21 07:55 06/18/21 11:46 06/18/21 16:11 06/18/21 20:31  ?Glucose-Capillary 70 - 99 mg/dL 956 (H) 213 (H) 086 (H) 112 (H)  ? ?Review of Glycemic Control ? ?Diabetes history: DM2 ?Outpatient Diabetes medications: Humulin R U500 80 units TID, Trulicity 1.5 mg Qweek, Glipizide 10 mg BID ?Current orders for Inpatient glycemic control: Regular 80 units TID, Novolog 0-20 units TID with meals, Novolog 0-5 units QHS ? ?Inpatient Diabetes Program Recommendations:   ? ?Insulin: Please discontinue Regular 80 units TID. If provider intends to resume Humulin R U500, please place orders for Humulin R U500 75 units TID with meals. ? ?NOTE: Patient is followed by Ronny Bacon, NP at [facility] and was last seen 01/23/21. Per office note on 01/23/21, patient was switched from Levemir 100 units BID and Novolog 40-46 units TID with meals to Humulin R U500 80 units TID with meals (continued on Glipizide and Trulicity as already taken).  Patient is currently ordered Regular insulin 80 units TID with meals. Glucose 69 mg/dl today at 5:78 am. Please discontinue Regular insulin and order Humulin R U500 (at decreased dose) if provider intends for patient to be on same insulin as taken outpatient. ? ?Thanks, ?Orlando Penner, RN, MSN, CDE ?Diabetes Coordinator ?Inpatient Diabetes Program ?[phone] (Team Pager from 8am to 5pm) ? ? ? ?

## 2021-06-19 NOTE — Progress Notes (Signed)
Hypoglycemic Event ? ?CBG: 69 ? ?Treatment: 4 oz juice/soda ? ?Symptoms: Shaky, Itchy ? ?Follow-up CBG: [id number] CBG Result:78 ? ?Possible Reasons for Event: Unknown ? ?Comments/MD notified: A. Zierle-Ghosh, DO informed ? ? ? ?[place] ? ? ?

## 2021-06-19 NOTE — Progress Notes (Signed)
Patient is using Hospital CPAP -- full face mask-- Auto BPAP setting- 8 to 4 . Wearing over oxygen. States she will place on when needed. Told to call if needed for help with Machine.  ?

## 2021-06-20 LAB — CBC WITH DIFFERENTIAL/PLATELET
Abs Immature Granulocytes: 0.04 10*3/uL (ref 0.00–0.07)
Basophils Absolute: 0.1 10*3/uL (ref 0.0–0.1)
Basophils Relative: 1 %
Eosinophils Absolute: 0.3 10*3/uL (ref 0.0–0.5)
Eosinophils Relative: 3 %
HCT: 36.7 % (ref 36.0–46.0)
Hemoglobin: 11.8 g/dL — ABNORMAL LOW (ref 12.0–15.0)
Immature Granulocytes: 0 %
Lymphocytes Relative: 36 %
Lymphs Abs: 3.3 10*3/uL (ref 0.7–4.0)
MCH: 30.7 pg (ref 26.0–34.0)
MCHC: 32.2 g/dL (ref 30.0–36.0)
MCV: 95.6 fL (ref 80.0–100.0)
Monocytes Absolute: 0.8 10*3/uL (ref 0.1–1.0)
Monocytes Relative: 8 %
Neutro Abs: 4.9 10*3/uL (ref 1.7–7.7)
Neutrophils Relative %: 52 %
Platelets: 205 10*3/uL (ref 150–400)
RBC: 3.84 MIL/uL — ABNORMAL LOW (ref 3.87–5.11)
RDW: 14.5 % (ref 11.5–15.5)
WBC: 9.4 10*3/uL (ref 4.0–10.5)
nRBC: 0 % (ref 0.0–0.2)

## 2021-06-20 LAB — BASIC METABOLIC PANEL
Anion gap: 9 (ref 5–15)
BUN: 16 mg/dL (ref 6–20)
CO2: 28 mmol/L (ref 22–32)
Calcium: 9.2 mg/dL (ref 8.9–10.3)
Chloride: 102 mmol/L (ref 98–111)
Creatinine, Ser: 0.86 mg/dL (ref 0.44–1.00)
GFR, Estimated: >60 mL/min (ref >60)
Glucose, Bld: 231 mg/dL — ABNORMAL HIGH (ref 70–99)
Potassium: 4.1 mmol/L (ref 3.5–5.1)
Sodium: 139 mmol/L (ref 135–145)

## 2021-06-20 LAB — GLUCOSE, CAPILLARY
Glucose-Capillary: 270 mg/dL — ABNORMAL HIGH (ref 70–99)
Glucose-Capillary: 287 mg/dL — ABNORMAL HIGH (ref 70–99)

## 2021-06-20 LAB — MAGNESIUM: Magnesium: 1.6 mg/dL — ABNORMAL LOW (ref 1.7–2.4)

## 2021-06-20 MED ORDER — HYDROCODONE-ACETAMINOPHEN 5-325 MG PO TABS: 1.0000 | ORAL_TABLET | Freq: Two times a day (BID) | ORAL | 0 refills | Status: AC | PRN

## 2021-06-20 MED ORDER — GLIPIZIDE 10 MG PO TABS: 10.0000 mg | ORAL_TABLET | Freq: Two times a day (BID) | ORAL | 1 refills | Status: DC

## 2021-06-20 MED ORDER — BUPROPION HCL ER (SR) 100 MG PO TB12: 100.0000 mg | ORAL_TABLET | Freq: Two times a day (BID) | ORAL | 1 refills | Status: DC

## 2021-06-20 MED ORDER — CEPHALEXIN 500 MG PO CAPS
500.0000 mg | ORAL_CAPSULE | Freq: Four times a day (QID) | ORAL | 0 refills | Status: AC
Start: 2021-06-20 — End: 2021-06-23

## 2021-06-20 MED ORDER — INSULIN REGULAR HUMAN 100 UNIT/ML IJ SOLN
40.0000 [IU] | Freq: Three times a day (TID) | INTRAMUSCULAR | Status: DC
  Administered 2021-06-20: 40 [IU] via SUBCUTANEOUS

## 2021-06-20 NOTE — Discharge Summary (Signed)
Physician Discharge Summary   Patient: Nicole [name] MRN: [id number] DOB: 1965/07/17  Admit date:     06/16/2021  Discharge date: 06/20/2021  Discharge Physician: Lewie Chamber   PCP: Benita Stabile, MD   Recommendations at discharge:    Follow up with endocrinology  Discharge Diagnoses: Principal Problem:   Cellulitis of leg, right Active Problems:   Uncontrolled type 2 diabetes mellitus with hyperglycemia, with long-term current use of insulin (HCC)   Morbid obesity with body mass index (BMI) of 50.0 to 59.9 in adult [facility])   Chronic respiratory failure with hypoxia (HCC)   Coronary atherosclerosis of native coronary artery/Stent in 2006   Dyslipidemia   HTN (hypertension), benign   COPD (chronic obstructive pulmonary disease) (HCC)  Resolved Problems:   Lactic acidosis  Hospital Course: Ms. Gelabert is a 56 year old female with PMH poorly controlled DMII, morbid obesity, CAD, HLD, GERD, anxiety who presented with erythema, pain, and swelling of her right lower extremity.  Symptoms had been progressing for approximately 5 days prior to admission after she developed a blister on her right shin.  She states after rupturing it continued to have intermittent bleeding. Due to worsening pain and redness, she presented for further evaluation. She has been on no antibiotics outpatient. X-ray revealed no acute findings.  She was started on vancomycin and admitted for further monitoring. See below for further problem-based plan.  Assessment and Plan: * Cellulitis of leg, right - low concern for abscess at this time; patient did describe a "blister" at home prior to admission, but appearance now is moreso excoriated skin and wound is not deep - erythema continues to improve  -Unable to express any purulent fluid on exam; edema appreciated but no fluctuance - okay to transition to keflex to complete 7 day course -Apply Vaseline dressing and cover.  Change daily  Uncontrolled type 2  diabetes mellitus with hyperglycemia, with long-term current use of insulin (HCC) - Poorly controlled -A1c on admission, 13.1% -Patient on U-500 with frequently missed doses at home - d/c semglee and convert back to regular insulin - giving 80 units per dose seems to have dropped CBGs into 60s which raises concern for inappropriate administration at home (scar tissue, etc) vs poor diet control given that her diet is more regulated in hospital; I've discussed these concerns with patient and she does endorse a worse diet at home in general -Patient was tolerating 30 to 40 units per meal of regular insulin.  Discussed at discharge she may need to continue further adjusting her regimen and continue trying to watch carb intake in efforts to reduce amount of insulin required -Recommended to follow-up with endocrinology as well  Chronic respiratory failure with hypoxia (HCC) She is chronically on 3 L nasal cannula  Morbid obesity with body mass index (BMI) of 50.0 to 59.9 in adult [facility]) - Complicates overall prognosis and care - Body mass index is 50.03 kg/m. - Weight Loss and Dietary Counseling given  COPD (chronic obstructive pulmonary disease) (HCC) - no s/s exac  HTN (hypertension), benign Continue metoprolol and lisinopril  Dyslipidemia Continue Crestor  Coronary atherosclerosis of native coronary artery/Stent in 2006 No chest pain presently Continue Plavix  Lactic acidosis-resolved as of 06/17/2021 Patient is afebrile hemodynamically stable without any tachycardia Sepsis ruled out - cleared with IVF         Consultants:  Procedures performed:   Disposition: Home Diet recommendation:  Discharge Diet Orders (From admission, onward)     Start  Ordered   06/20/21 0000  Diet - low sodium heart healthy        06/20/21 0922   06/20/21 0000  Diet Carb Modified        06/20/21 0922           Cardiac and Carb modified diet DISCHARGE MEDICATION: Allergies as of  06/20/2021       Reactions   Aspirin Anaphylaxis, Other (See Comments)   Throat closing    Bee Pollen Anaphylaxis   Bee Venom Anaphylaxis   Food Anaphylaxis   PINEAPPLE   Pineapple Anaphylaxis   E-mycin [erythromycin Base] Nausea And Vomiting   Other Rash        Medication List     STOP taking these medications    gentamicin cream 0.1 % Commonly known as: GARAMYCIN   mupirocin ointment 2 % Commonly known as: BACTROBAN   senna-docusate 8.6-50 MG tablet Commonly known as: Senokot-S   simvastatin 80 MG tablet Commonly known as: ZOCOR       TAKE these medications    Accu-Chek Guide test strip Generic drug: glucose blood USE 1 STRIP TO CHECK GLUCOSE 4 TIMES DAILY   acetaminophen 500 MG tablet Commonly known as: TYLENOL Take 1 tablet (500 mg total) by mouth every 6 (six) hours as needed for moderate pain.   albuterol (2.5 MG/3ML) 0.083% nebulizer solution Commonly known as: PROVENTIL Take 3 mLs (2.5 mg total) by nebulization every 6 (six) hours as needed for wheezing.   albuterol 108 (90 Base) MCG/ACT inhaler Commonly known as: VENTOLIN HFA Inhale 2 puffs into the lungs every 6 (six) hours as needed for wheezing. Shortness of breath   blood glucose meter kit and supplies Dispense based on patient and insurance preference. Use four times daily as directed. (FOR ICD-10 E10.9, E11.9).   buPROPion ER 100 MG 12 hr tablet Commonly known as: WELLBUTRIN SR Take 1 tablet (100 mg total) by mouth 2 (two) times daily.   celecoxib 200 MG capsule Commonly known as: CELEBREX Take 200 mg by mouth 2 (two) times daily.   cephALEXin 500 MG capsule Commonly known as: KEFLEX Take 1 capsule (500 mg total) by mouth every 6 (six) hours for 3 days.   clopidogrel 75 MG tablet Commonly known as: PLAVIX Take 1 tablet (75 mg total) by mouth daily.   docusate sodium 100 MG capsule Commonly known as: COLACE Take 1 capsule (100 mg total) by mouth 2 (two) times daily as needed for  mild constipation.   DULoxetine 60 MG capsule Commonly known as: CYMBALTA Take 1 capsule (60 mg total) by mouth daily.   Easy Touch Insulin Syringe 31G X 5/16" 1 ML Misc Generic drug: Insulin Syringe-Needle U-100   EPINEPHrine 0.3 mg/0.3 mL Soaj injection Commonly known as: EPI-PEN Inject 0.3 mg into the muscle as needed for anaphylaxis.   furosemide 80 MG tablet Commonly known as: LASIX Take 1 tablet (80 mg total) by mouth daily. What changed:  when to take this reasons to take this   glipiZIDE 10 MG tablet Commonly known as: GLUCOTROL Take 1 tablet (10 mg total) by mouth in the morning and at bedtime.   HumuLIN R U-500 KwikPen 500 UNIT/ML KwikPen Generic drug: insulin regular human CONCENTRATED Inject 80 Units into the skin 3 (three) times daily with meals.   HYDROcodone-acetaminophen 5-325 MG tablet Commonly known as: NORCO/VICODIN Take 1 tablet by mouth 2 (two) times daily as needed for up to 3 days for severe pain.   Kerendia 20 MG  Tabs Generic drug: Finerenone Take 1 tablet by mouth daily.   lisinopril 2.5 MG tablet Commonly known as: ZESTRIL Take 1 tablet (2.5 mg total) by mouth daily. What changed: how much to take   loperamide 2 MG capsule Commonly known as: IMODIUM Take 1 capsule (2 mg total) by mouth every 6 (six) hours as needed for diarrhea or loose stools.   metoprolol tartrate 50 MG tablet Commonly known as: LOPRESSOR Take 1 tablet (50 mg total) by mouth 2 (two) times daily.   nitroGLYCERIN 0.4 MG SL tablet Commonly known as: NITROSTAT Take one tablet every 5 minutes as needed for chest pains up to 3 tablets. What changed:  how much to take how to take this when to take this reasons to take this additional instructions   One-A-Day Womens Formula Tabs Take 1 tablet by mouth daily.   pantoprazole 40 MG tablet Commonly known as: PROTONIX Take 1 tablet (40 mg total) by mouth daily.   Pen Needles 31G X 8 MM Misc UAD   rosuvastatin 40 MG  tablet Commonly known as: CRESTOR Take 40 mg by mouth daily.   Trulicity 1.5 MG/0.5ML Sopn Generic drug: Dulaglutide Inject 1.5 mg into the skin once a week.   Vitamin D (Ergocalciferol) 1.25 MG (50000 UNIT) Caps capsule Commonly known as: DRISDOL Take 50,000 Units by mouth once a week.               Discharge Care Instructions  (From admission, onward)           Start     Ordered   06/20/21 0000  Discharge wound care:       Comments: Apply vaseline dressing to right leg wound and cover with tegaderm. Change daily.   06/20/21 0922            Discharge Exam: Filed Weights   06/16/21 0901 06/16/21 1502  Weight: 136.1 kg 132.2 kg   Physical Exam Constitutional:      Comments: Pleasant morbidly obese woman in NAD  HENT:     Head: Normocephalic and atraumatic.     Mouth/Throat:     Mouth: Mucous membranes are moist.  Eyes:     Extraocular Movements: Extraocular movements intact.  Cardiovascular:     Rate and Rhythm: Normal rate and regular rhythm.  Pulmonary:     Effort: Pulmonary effort is normal.     Breath sounds: Normal breath sounds.  Abdominal:     General: Bowel sounds are normal. There is no distension.     Palpations: Abdomen is soft.     Tenderness: There is no abdominal tenderness.     Comments: obese  Musculoskeletal:     Cervical back: Normal range of motion and neck supple.     Comments: RLE noted with barely any erythema remaining over shin. Well healing excoriated skin in center of wound. Mild TTP. 1+ edema still in RLE.  Skin:    General: Skin is warm and dry.  Neurological:     General: No focal deficit present.  Psychiatric:        Mood and Affect: Mood normal.        Behavior: Behavior normal.    Condition at discharge: stable  The results of significant diagnostics from this hospitalization (including imaging, microbiology, ancillary and laboratory) are listed below for reference.   Imaging Studies: DG Tibia/Fibula  Right  Result Date: 06/16/2021 CLINICAL DATA:  Diabetic leg wound anteriorly EXAM: RIGHT TIBIA AND FIBULA - 2 VIEW COMPARISON:  10/31/2020 FINDINGS: Negative for fracture. Negative for osteomyelitis. Anterior soft tissue swelling. Degenerative change in the knee. Prominent spurring of the calcaneus at the Achilles tendon insertion and plantar fascia insertion. IMPRESSION: Negative for fracture or osteomyelitis. Electronically Signed   By: Marlan Palau M.D.   On: 06/16/2021 12:04    Microbiology: Results for orders placed or performed during the hospital encounter of 06/16/21  Blood Cultures x 2 sites     Status: None (Preliminary result)   Collection Time: 06/16/21 11:47 AM   Specimen: Right Antecubital; Blood  Result Value Ref Range Status   Specimen Description RIGHT ANTECUBITAL  Final   Special Requests   Final    BOTTLES DRAWN AEROBIC AND ANAEROBIC Blood Culture adequate volume   Culture   Final    NO GROWTH 3 DAYS Performed at [facility], [address] [place], [place] [address]    Report Status PENDING  Incomplete  Blood Cultures x 2 sites     Status: None (Preliminary result)   Collection Time: 06/16/21 11:47 AM   Specimen: Left Antecubital; Blood  Result Value Ref Range Status   Specimen Description LEFT ANTECUBITAL  Final   Special Requests   Final    BOTTLES DRAWN AEROBIC AND ANAEROBIC Blood Culture adequate volume   Culture   Final    NO GROWTH 3 DAYS Performed at [facility], [address], [place], [place] [address]    Report Status PENDING  Incomplete    Labs: CBC: Recent Labs  Lab 06/16/21 1145 06/17/21 0512 06/18/21 0433 06/19/21 0513 06/20/21 0608  WBC 10.4 8.9 8.8 9.3 9.4  NEUTROABS 7.0  --  4.5 5.1 4.9  HGB 11.6* 11.0* 11.4* 11.1* 11.8*  HCT 35.8* 34.6* 36.1 35.2* 36.7  MCV 94.0 95.1 96.3 95.9 95.6  PLT 214 190 187 196 205   Basic Metabolic Panel: Recent Labs  Lab 06/16/21 1145 06/17/21 0512 06/18/21 0433 06/19/21 0513 06/20/21 0608  NA  136 140 140 138 139  K 4.1 4.0 4.2 3.9 4.1  CL 99 104 105 102 102  CO2 29 29 29 29 28   GLUCOSE 408* 314* 249* 201* 231*  BUN 23* 16 13 14 16   CREATININE 0.83 0.82 0.83 0.87 0.86  CALCIUM 9.1 8.9 9.3 9.0 9.2  MG  --   --  1.6* 1.5* 1.6*   Liver Function Tests: Recent Labs  Lab 06/16/21 1145  AST 24  ALT 14  ALKPHOS 89  BILITOT 0.4  PROT 7.0  ALBUMIN 3.2*   CBG: Recent Labs  Lab 06/19/21 1733 06/19/21 1846 06/19/21 2020 06/20/21 0701 06/20/21 0729  GLUCAP 92 194* 221* 270* 287*    Discharge time spent: greater than 30 minutes.  Signed: Lewie Chamber, MD [facility] 06/20/2021

## 2021-06-20 NOTE — Discharge Instructions (Signed)
Follow up with endocrinology and ask about further insulin adjustment as needed. ? ?With stricter diet control you may not require 80 units of insulin so this may need to be reduced accordingly.  ?

## 2021-06-21 LAB — CULTURE, BLOOD (ROUTINE X 2)
Culture: NO GROWTH
Culture: NO GROWTH
Special Requests: ADEQUATE
Special Requests: ADEQUATE

## 2021-06-23 DIAGNOSIS — J449 Chronic obstructive pulmonary disease, unspecified: Secondary | ICD-10-CM | POA: Diagnosis not present

## 2021-06-24 DIAGNOSIS — M6281 Muscle weakness (generalized): Secondary | ICD-10-CM | POA: Diagnosis not present

## 2021-06-30 DIAGNOSIS — G4733 Obstructive sleep apnea (adult) (pediatric): Secondary | ICD-10-CM | POA: Diagnosis not present

## 2021-07-04 ENCOUNTER — Other Ambulatory Visit: Payer: Self-pay | Admitting: Family

## 2021-07-04 DIAGNOSIS — E1169 Type 2 diabetes mellitus with other specified complication: Secondary | ICD-10-CM

## 2021-07-17 DIAGNOSIS — E1165 Type 2 diabetes mellitus with hyperglycemia: Secondary | ICD-10-CM | POA: Diagnosis not present

## 2021-07-17 DIAGNOSIS — E1122 Type 2 diabetes mellitus with diabetic chronic kidney disease: Secondary | ICD-10-CM | POA: Diagnosis not present

## 2021-07-17 DIAGNOSIS — E1169 Type 2 diabetes mellitus with other specified complication: Secondary | ICD-10-CM | POA: Diagnosis not present

## 2021-07-19 DIAGNOSIS — J449 Chronic obstructive pulmonary disease, unspecified: Secondary | ICD-10-CM | POA: Diagnosis not present

## 2021-07-24 DIAGNOSIS — J449 Chronic obstructive pulmonary disease, unspecified: Secondary | ICD-10-CM | POA: Diagnosis not present

## 2021-07-31 DIAGNOSIS — M6281 Muscle weakness (generalized): Secondary | ICD-10-CM | POA: Diagnosis not present

## 2021-08-04 ENCOUNTER — Other Ambulatory Visit (HOSPITAL_COMMUNITY)
Admission: RE | Admit: 2021-08-04 | Discharge: 2021-08-04 | Disposition: A | Discharge status: Home / Self Care | Payer: Medicare Other | Source: Ambulatory Visit | Attending: Student | Admitting: Student

## 2021-08-04 ENCOUNTER — Encounter: Payer: Self-pay | Admitting: Student

## 2021-08-04 ENCOUNTER — Ambulatory Visit (INDEPENDENT_AMBULATORY_CARE_PROVIDER_SITE_OTHER): Payer: Medicare Other | Admitting: Student

## 2021-08-04 VITALS — BP 130/50 | HR 60 | Ht 63.0 in | Wt 312.0 lb

## 2021-08-04 DIAGNOSIS — I2510 Atherosclerotic heart disease of native coronary artery without angina pectoris: Secondary | ICD-10-CM | POA: Diagnosis not present

## 2021-08-04 DIAGNOSIS — I25118 Atherosclerotic heart disease of native coronary artery with other forms of angina pectoris: Secondary | ICD-10-CM

## 2021-08-04 DIAGNOSIS — E785 Hyperlipidemia, unspecified: Secondary | ICD-10-CM | POA: Diagnosis not present

## 2021-08-04 DIAGNOSIS — I10 Essential (primary) hypertension: Secondary | ICD-10-CM

## 2021-08-04 DIAGNOSIS — R600 Localized edema: Secondary | ICD-10-CM | POA: Diagnosis not present

## 2021-08-04 LAB — CBC
HCT: 34.3 % — ABNORMAL LOW (ref 36.0–46.0)
Hemoglobin: 11.3 g/dL — ABNORMAL LOW (ref 12.0–15.0)
MCH: 31.1 pg (ref 26.0–34.0)
MCHC: 32.9 g/dL (ref 30.0–36.0)
MCV: 94.5 fL (ref 80.0–100.0)
Platelets: 158 10*3/uL (ref 150–400)
RBC: 3.63 MIL/uL — ABNORMAL LOW (ref 3.87–5.11)
RDW: 13.9 % (ref 11.5–15.5)
WBC: 8.7 10*3/uL (ref 4.0–10.5)
nRBC: 0 % (ref 0.0–0.2)

## 2021-08-04 LAB — BASIC METABOLIC PANEL
Anion gap: 6 (ref 5–15)
BUN: 24 mg/dL — ABNORMAL HIGH (ref 6–20)
CO2: 27 mmol/L (ref 22–32)
Calcium: 9 mg/dL (ref 8.9–10.3)
Chloride: 101 mmol/L (ref 98–111)
Creatinine, Ser: 1.05 mg/dL — ABNORMAL HIGH (ref 0.44–1.00)
GFR, Estimated: >60 mL/min (ref >60)
Glucose, Bld: 460 mg/dL — ABNORMAL HIGH (ref 70–99)
Potassium: 4.2 mmol/L (ref 3.5–5.1)
Sodium: 134 mmol/L — ABNORMAL LOW (ref 135–145)

## 2021-08-04 NOTE — Progress Notes (Signed)
Cardiology Office Note    Date:  08/04/2021   ID:  Nicole [name], DOB 1965/07/15, MRN [id number]  PCP:  Celene Squibb, MD  Cardiologist: Dorris Carnes, MD    Chief Complaint  Patient presents with   Follow-up    Recent chest pain    History of Present Illness:    Nicole [name] is a 56 y.o. female with past medical history of CAD (s/p stenting in 2009, low-risk NST in 2013, reported 2 more stents in [place] in 2020), HTN, HLD, PAD (s/p L BKA), COPD and Type 2 DM who presents to the office today for evaluation of chest pain.  She was last examined by Dr. Harrington Challenger in 09/2020 and reported having intermittent chest pain which would come and go on a daily basis and was not associated with activity. It was recommended to obtain records from her prior cardiologist and to consider a repeat catheterization pending records as a stress test may be challenging given her body habitus.  By review of notes, she was admitted to [facility] in 05/2021 for cellulitis of her right leg and was treated with antibiotic therapy. Was also noted to have poorly controlled Type II DM with Hgb A1c at 13.1 and insulin dosing was adjusted.  In talking with the patient today, she reports having more frequent episodes of chest pain for the past 2 weeks. She is mostly sedentary due to her amputation but reports her pain typically occurs at rest. Is mostly along her sternal region but resembles her prior angina. Does not like to take SL NTG due to headaches and previously intolerant to Imdur. Also reports a history of 2 false-negative stress tests in the past.   Past Medical History:  Diagnosis Date   CAP (community acquired pneumonia)    Streptococcus 01/2011   COPD (chronic obstructive pulmonary disease) ([place])    Coronary atherosclerosis of native coronary artery    Diagnosed [place] 2006 - DES RCA, reports followup cath 2009 at [facility]    Dyslipidemia    Essential hypertension, benign    Morbid obesity ([place])     Pancreatitis    Type 2 diabetes mellitus ([place])     Past Surgical History:  Procedure Laterality Date   ABDOMINAL HERNIA REPAIR     ABDOMINAL HYSTERECTOMY     AMPUTATION Left 01/25/2020   Procedure: LEFT BELOW KNEE AMPUTATION;  Surgeon: Newt Minion, MD;  Location: [place];  Service: Orthopedics;  Laterality: Left;   CORONARY ANGIOPLASTY WITH STENT PLACEMENT  2006   KNEE SURGERY     NOSE SURGERY     Pilonidal cystectomy     TONSILLECTOMY      Current Medications: Outpatient Medications Prior to Visit  Medication Sig Dispense Refill   ACCU-CHEK GUIDE test strip USE 1 STRIP TO CHECK GLUCOSE 4 TIMES DAILY 400 each 1   acetaminophen (TYLENOL) 500 MG tablet Take 1 tablet (500 mg total) by mouth every 6 (six) hours as needed for moderate pain. 30 tablet 0   albuterol (PROVENTIL) (2.5 MG/3ML) 0.083% nebulizer solution Take 3 mLs (2.5 mg total) by nebulization every 6 (six) hours as needed for wheezing. 75 mL 12   albuterol (VENTOLIN HFA) 108 (90 Base) MCG/ACT inhaler Inhale 2 puffs into the lungs every 6 (six) hours as needed for wheezing. Shortness of breath 18 g 3   blood glucose meter kit and supplies Dispense based on patient and insurance preference. Use four times daily as directed. (FOR ICD-10 E10.9, E11.9).  1 each 0   buPROPion ER (WELLBUTRIN SR) 100 MG 12 hr tablet Take 1 tablet (100 mg total) by mouth 2 (two) times daily. 60 tablet 1   celecoxib (CELEBREX) 200 MG capsule Take 200 mg by mouth 2 (two) times daily.     clopidogrel (PLAVIX) 75 MG tablet Take 1 tablet (75 mg total) by mouth daily. 90 tablet 1   docusate sodium (COLACE) 100 MG capsule Take 1 capsule (100 mg total) by mouth 2 (two) times daily as needed for mild constipation. 60 capsule 2   EASY TOUCH INSULIN SYRINGE 31G X 5/16" 1 ML MISC      EPINEPHrine 0.3 mg/0.3 mL IJ SOAJ injection Inject 0.3 mg into the muscle as needed for anaphylaxis. 1 each 2   furosemide (LASIX) 80 MG tablet Take 1 tablet (80 mg total) by mouth  daily. (Patient taking differently: Take 80 mg by mouth daily as needed for fluid.) 90 tablet 1   glipiZIDE (GLUCOTROL) 10 MG tablet Take 1 tablet (10 mg total) by mouth in the morning and at bedtime. 60 tablet 1   Insulin Pen Needle (B-D ULTRAFINE III SHORT PEN) 31G X 8 MM MISC USE AS DIRECTED 30 each 0   insulin regular human CONCENTRATED (HUMULIN R U-500 KWIKPEN) 500 UNIT/ML KwikPen Inject 80 Units into the skin 3 (three) times daily with meals. 45 mL 3   KERENDIA 20 MG TABS Take 1 tablet by mouth daily.     lisinopril (ZESTRIL) 10 MG tablet Take 10 mg by mouth daily.     loperamide (IMODIUM) 2 MG capsule Take 1 capsule (2 mg total) by mouth every 6 (six) hours as needed for diarrhea or loose stools. 30 capsule 0   metoprolol tartrate (LOPRESSOR) 50 MG tablet Take 1 tablet (50 mg total) by mouth 2 (two) times daily. 180 tablet 1   Multiple Vitamins-Calcium (ONE-A-DAY WOMENS FORMULA) TABS Take 1 tablet by mouth daily.     nitroGLYCERIN (NITROSTAT) 0.4 MG SL tablet Take one tablet every 5 minutes as needed for chest pains up to 3 tablets. (Patient taking differently: Place 0.4 mg under the tongue every 5 (five) minutes as needed for chest pain (every 5 minutes as needed for chest pains up to 3 tablets.).) 25 tablet 3   pantoprazole (PROTONIX) 40 MG tablet Take 1 tablet (40 mg total) by mouth daily. 90 tablet 2   TRULICITY 1.5 TD/9.7CB SOPN Inject 1.5 mg into the skin once a week. 2 mL 6   Vitamin D, Ergocalciferol, (DRISDOL) 1.25 MG (50000 UNIT) CAPS capsule Take 50,000 Units by mouth once a week.     rosuvastatin (CRESTOR) 40 MG tablet Take 40 mg by mouth daily. (Patient not taking: Reported on 08/04/2021)     DULoxetine (CYMBALTA) 60 MG capsule Take 1 capsule (60 mg total) by mouth daily. 90 capsule 1   lisinopril (ZESTRIL) 2.5 MG tablet Take 1 tablet (2.5 mg total) by mouth daily. 90 tablet 2   No facility-administered medications prior to visit.     Allergies:   Aspirin, Bee pollen, Bee  venom, Food, Pineapple, E-mycin [erythromycin base], and Other   Social History   Socioeconomic History   Marital status: Divorced    Spouse name: Not on file   Number of children: 3   Years of education: Not on file   Highest education level: Not on file  Occupational History   Occupation: Home health aide    Employer: [employer]  Tobacco Use  Smoking status: Former    Packs/day: 0.50    Years: 20.00    Total pack years: 10.00    Types: Cigarettes    Quit date: 01/22/2020    Years since quitting: 1.5   Smokeless tobacco: Never  Vaping Use   Vaping Use: Never used  Substance and Sexual Activity   Alcohol use: No   Drug use: No   Sexual activity: Yes    Birth control/protection: Surgical  Other Topics Concern   Not on file  Social History Narrative   Not on file   Social Determinants of Health   Financial Resource Strain: Not on file  Food Insecurity: Not on file  Transportation Needs: Not on file  Physical Activity: Not on file  Stress: Not on file  Social Connections: Not on file     Family History:  The patient's family history includes Asthma in her son; Diabetes in her mother; Heart failure in her mother; Hypertension in her mother; Kidney failure in her mother; Ovarian cancer in her mother.   Review of Systems:    Please see the history of present illness.     All other systems reviewed and are otherwise negative except as noted above.   Physical Exam:    VS:  BP (!) 130/50   Pulse 60   Ht 5' 3"  (1.6 m)   Wt (!) 312 lb (141.5 kg)   SpO2 98%   BMI 55.27 kg/m    General: Pleasant, obese female appearing in no acute distress. Sitting in wheelchair.  Head: Normocephalic, atraumatic. Neck: No carotid bruits. JVD not elevated.  Lungs: Respirations regular and unlabored, without wheezes or rales. On supplemental oxygen.  Heart: Regular rate and rhythm. No S3 or S4.  No murmur, no rubs, or gallops appreciated. Abdomen: Appears  non-distended. No obvious abdominal masses. Msk:  Strength and tone appear normal for age. No obvious joint deformities or effusions. Extremities: No clubbing or cyanosis. Trace lower extremity edema. L BKA.  Neuro: Alert and oriented X 3. Moves all extremities spontaneously. No focal deficits noted. Psych:  Responds to questions appropriately with a normal affect. Skin: No rashes or lesions noted  Wt Readings from Last 3 Encounters:  08/04/21 (!) 312 lb (141.5 kg)  06/16/21 291 lb 7.2 oz (132.2 kg)  01/09/21 (!) 319 lb (144.7 kg)     Studies/Labs Reviewed:   EKG:  EKG is ordered today.  The ekg ordered today demonstrates NSR, HR 60 with known RBBB and fascicular block.   Recent Labs: 06/16/2021: ALT 14 06/20/2021: Magnesium 1.6 08/04/2021: BUN 24; Creatinine, Ser 1.05; Hemoglobin 11.3; Platelets 158; Potassium 4.2; Sodium 134   Lipid Panel    Component Value Date/Time   CHOL 117 12/25/2019 1359   TRIG 208 (H) 12/25/2019 1359   HDL 25 (L) 12/25/2019 1359   CHOLHDL 4.7 12/25/2019 1359   VLDL 51 (H) 04/11/2012 0253   LDLCALC 65 12/25/2019 1359    Additional studies/ records that were reviewed today include:   NST: 06/2011 Impression Exercise Capacity:  Lexiscan with no exercise. BP Response:  Normal blood pressure response. Clinical Symptoms:  No significant symptoms noted. ECG Impression:  No significant ST segment change suggestive of ischemia. Comparison with Prior Nuclear Study: No images to compare   Overall Impression:  Normal stress nuclear study.  Carotid Dopplers: 03/2012 IMPRESSION:  Mild plaque in the left internal carotid artery.  Less than 50%  stenosis in the right and left internal carotid arteries.  Vertebral arteries could  not be visualized.  Overall, the exam was  limited.  Further characterization with CTA or MRA of the neck can  be performed.   Echocardiogram: 03/2019  The left ventricular systolic function is normal with an ejection  fraction of  51-55%.    The left ventricular cavity size is normal.    There is severe concentric left ventricular hypertrophy present.    The right ventricular systolic function is normal.    The right ventricular cavity size is normal.    The mitral valve structure is normal.   Assessment:    1. Coronary artery disease involving native coronary artery of native heart with other form of angina pectoris ([place])   2. Essential hypertension   3. Bilateral lower extremity edema   4. Hyperlipidemia LDL goal <70      Plan:   In order of problems listed above:  1. CAD/Chest Pain - She is s/p stenting in 2009 with low-risk NST in 2013 and reported 2 more stents in [place] in 2020 (we are again requesting records today but this is challenging as she is unsure of the exact hospital). She reports more frequent chest pain over the past 2 weeks and given her history of false-negative stress tests and intolerances to antianginals in the past, will plan for a repeat cardiac catheterization which was previously referenced at the time of her last visit. Will make sure Dr. Harrington Challenger is aware and to see if she has any additional recommendations in the interim. Risks and benefits reviewed and the patient and she agrees to proceed. Will obtain pre-procedure labs today.  - Continue Plavix 26m daily (allergic to ASA), Lopressor 563mBID and Crestor 4055maily. Previously intolerant to Imdur and we reviewed the option of Ranexa but she is hesitant to be on additional medical therapy given her prior intolerances.   2. HTN - BP is well-controlled at 130/50 during today's visit. Continue current medical therapy.   3. Lower Extremity Edema - This is chronic per her report and likely dependent as she sits in a wheelchair a majority of the day. Remains on Lasix 46m60mily. Will recheck a BMET today.   4. HLD - Followed by her PCP. Will request most recent labs . She remains on Crestor 40mg56mly.    Shared Decision  Making/Informed Consent:   Shared Decision Making/Informed Consent The risks [stroke (1 in 1000), death (1 in 1000), kidney failure [usually temporary] (1 in 500), bleeding (1 in 200), allergic reaction [possibly serious] (1 in 200)], benefits (diagnostic support and management of coronary artery disease) and alternatives of a cardiac catheterization were discussed in detail with Ms. Drakes and she is willing to proceed.    Medication Adjustments/Labs and Tests Ordered: Current medicines are reviewed at length with the patient today.  Concerns regarding medicines are outlined above.  Medication changes, Labs and Tests ordered today are listed in the Patient Instructions below. Patient Instructions  Medication Instructions:  Your physician recommends that you continue on your current medications as directed. Please refer to the Current Medication list given to you today.   Labwork: CBC BMET   Testing/Procedures: None  Follow-Up: Follow with Dr. Ross Harrington [facility] 3-4 weeks after Cath.   Any Other Special Instructions Will Be Listed Below (If Applicable).     If you need a refill on your cardiac medications before your next appointment, please call your pharmacy.    [facility] [place] [address] Dept: [phone] Loc: [place]  08/04/2021  You are scheduled for a Cardiac Catheterization on Wednesday, June 21 with Dr. Sherren Mocha.  1. Please arrive at the Main Entrance A at [facility]: [place], [place] [address] at 11:00 AM (This time is two hours before your procedure to ensure your preparation). Free valet parking service is available.   Special note: Every effort is made to have your procedure done on time. Please understand that emergencies sometimes delay scheduled procedures.  2. Diet: Do not eat solid foods after  midnight.  You may have clear liquids until 5 AM upon the day of the procedure.  3. Labs: You do not need new labs drawn.   4. Medication instructions in preparation for your procedure:   Contrast Allergy: No   Hold Lisinopril and Lasix the morning of your heart cath.   Hold Glipizide the day before procedure and two days after procedure.   Only take 40 units of your insulin the night before your procedure. DO NOT TAKE ANY INSULIN THE MORNING OF YOUR PROCEDURE.   MAKE SURE TO TAKE PLAVIX MORNING OF PROCEDURE  On the morning of your procedure, takeany morning medicines NOT listed above.  You may use sips of water.  5. Plan to go home the same day, you will only stay overnight if medically necessary. 6. You MUST have a responsible adult to drive you home. 7. An adult MUST be with you the first 24 hours after you arrive home. 8. Bring a current list of your medications, and the last time and date medication taken. 9. Bring ID and current insurance cards. 10.Please wear clothes that are easy to get on and off and wear slip-on shoes.  Thank you for allowing [place] to care for you!   -- [place] [place] [facility]    Signed, Waynetta Pean  08/04/2021 8:23 PM    [facility]. [address] [place], [place] [address] Phone: [phone] Fax: [phone]

## 2021-08-04 NOTE — Patient Instructions (Addendum)
Medication Instructions:  Your physician recommends that you continue on your current medications as directed. Please refer to the Current Medication list given to you today.   Labwork: CBC BMET   Testing/Procedures: None  Follow-Up: Follow with Dr. Tenny Craw or Randall An, PA-C 3-4 weeks after Cath.   Any Other Special Instructions Will Be Listed Below (If Applicable).     If you need a refill on your cardiac medications before your next appointment, please call your pharmacy.    [facility] [address] [place] [address] Dept: [phone] Loc: [phone]  Nicole [name]  08/04/2021  You are scheduled for a Cardiac Catheterization on Wednesday, June 21 with Dr. Tonny Bollman.  1. Please arrive at the Main Entrance A at [facility]: [address] [place], [place] [address] at 11:00 AM (This time is two hours before your procedure to ensure your preparation). Free valet parking service is available.   Special note: Every effort is made to have your procedure done on time. Please understand that emergencies sometimes delay scheduled procedures.  2. Diet: Do not eat solid foods after midnight.  You may have clear liquids until 5 AM upon the day of the procedure.  3. Labs: You do not need new labs drawn.   4. Medication instructions in preparation for your procedure:   Contrast Allergy: No   Hold Lisinopril and Lasix the morning of your heart cath.   Hold Glipizide the day before procedure and two days after procedure.   Only take 40 units of your insulin the night before your procedure. DO NOT TAKE ANY INSULIN THE MORNING OF YOUR PROCEDURE.   MAKE SURE TO TAKE PLAVIX MORNING OF PROCEDURE  On the morning of your procedure, takeany morning medicines NOT listed above.  You may use sips of water.  5. Plan to go home the same day, you will only stay overnight if medically  necessary. 6. You MUST have a responsible adult to drive you home. 7. An adult MUST be with you the first 24 hours after you arrive home. 8. Bring a current list of your medications, and the last time and date medication taken. 9. Bring ID and current insurance cards. 10.Please wear clothes that are easy to get on and off and wear slip-on shoes.  Thank you for allowing [place] to care for you!   -- [place] [facility]

## 2021-08-05 ENCOUNTER — Telehealth: Payer: Self-pay | Admitting: *Deleted

## 2021-08-05 DIAGNOSIS — Z79899 Other long term (current) drug therapy: Secondary | ICD-10-CM

## 2021-08-05 MED ORDER — FUROSEMIDE 40 MG PO TABS: 60.0000 mg | ORAL_TABLET | Freq: Every day | ORAL | 11 refills | Status: DC

## 2021-08-05 NOTE — Telephone Encounter (Signed)
Pt notified an orders placed

## 2021-08-05 NOTE — Telephone Encounter (Signed)
-----   Message from Ellsworth Lennox, [place] sent at 08/04/2021  7:08 PM EDT ----- Please let the patient know her hemoglobin is at 11.3 which is similar to prior values in 05/2021. Platelets are normal. Her sodium level is slightly low and creatinine is elevated which suggests dehydration. She is currently taking Lasix 80mg  daily and would have her reduce this to 60mg  daily. Hold the morning of her cath. Would recheck a BMET in 3-4 weeks. Glucose significantly elevated and she needs to follow-up with her PCP if this remains elevated at home.

## 2021-08-07 DIAGNOSIS — Z89512 Acquired absence of left leg below knee: Secondary | ICD-10-CM | POA: Diagnosis not present

## 2021-08-08 ENCOUNTER — Other Ambulatory Visit (INDEPENDENT_AMBULATORY_CARE_PROVIDER_SITE_OTHER): Payer: Self-pay | Admitting: Nurse Practitioner

## 2021-08-08 DIAGNOSIS — J449 Chronic obstructive pulmonary disease, unspecified: Secondary | ICD-10-CM

## 2021-08-08 DIAGNOSIS — I10 Essential (primary) hypertension: Secondary | ICD-10-CM

## 2021-08-08 DIAGNOSIS — E669 Obesity, unspecified: Secondary | ICD-10-CM

## 2021-08-08 DIAGNOSIS — I2510 Atherosclerotic heart disease of native coronary artery without angina pectoris: Secondary | ICD-10-CM

## 2021-08-08 DIAGNOSIS — E785 Hyperlipidemia, unspecified: Secondary | ICD-10-CM

## 2021-08-13 ENCOUNTER — Telehealth: Payer: Self-pay | Admitting: *Deleted

## 2021-08-13 NOTE — Telephone Encounter (Addendum)
Cardiac Catheterization scheduled at [facility] for: Friday August 14, 2021 12 Noon Arrival time and place: [facility] Main Entrance A at: 10 AM   Nothing to eat after midnight prior to procedure, clear liquids until 5 AM day of procedure.  Medication instructions: -Hold:  Insulin/Glipizide/Lasix -AM of procedure 1/2 usual Insulin dose HS prior to procedure  Trulicity-weekly-will take on Thursday -Except hold medications usual morning medications can be taken with sips of water including Plavix 75 mg.   (Aspirin allergy).   Confirmed patient has responsible adult to drive home post procedure and be with patient first 24 hours after arriving home.  Patient reports no new symptoms concerning for COVID-19 in the past 10 days.  Reviewed procedure instructions with patient.

## 2021-08-14 ENCOUNTER — Other Ambulatory Visit: Payer: Self-pay

## 2021-08-14 ENCOUNTER — Ambulatory Visit (HOSPITAL_COMMUNITY)
Admission: RE | Admit: 2021-08-14 | Discharge: 2021-08-15 | Disposition: A | Discharge status: Home / Self Care | Payer: Medicare Other | Attending: Cardiovascular Disease | Admitting: Cardiovascular Disease

## 2021-08-14 ENCOUNTER — Encounter (HOSPITAL_COMMUNITY): Payer: Self-pay | Admitting: Cardiovascular Disease

## 2021-08-14 ENCOUNTER — Ambulatory Visit (HOSPITAL_COMMUNITY)
Admission: RE | Disposition: A | Discharge status: Home / Self Care | Payer: Self-pay | Source: Home / Self Care | Attending: Cardiovascular Disease

## 2021-08-14 DIAGNOSIS — I878 Other specified disorders of veins: Secondary | ICD-10-CM | POA: Insufficient documentation

## 2021-08-14 DIAGNOSIS — Z87891 Personal history of nicotine dependence: Secondary | ICD-10-CM | POA: Diagnosis not present

## 2021-08-14 DIAGNOSIS — Z7984 Long term (current) use of oral hypoglycemic drugs: Secondary | ICD-10-CM | POA: Insufficient documentation

## 2021-08-14 DIAGNOSIS — Z955 Presence of coronary angioplasty implant and graft: Secondary | ICD-10-CM | POA: Insufficient documentation

## 2021-08-14 DIAGNOSIS — E781 Pure hyperglyceridemia: Secondary | ICD-10-CM | POA: Insufficient documentation

## 2021-08-14 DIAGNOSIS — I10 Essential (primary) hypertension: Secondary | ICD-10-CM | POA: Diagnosis not present

## 2021-08-14 DIAGNOSIS — Z886 Allergy status to analgesic agent status: Secondary | ICD-10-CM | POA: Insufficient documentation

## 2021-08-14 DIAGNOSIS — Z7902 Long term (current) use of antithrombotics/antiplatelets: Secondary | ICD-10-CM | POA: Diagnosis not present

## 2021-08-14 DIAGNOSIS — Z825 Family history of asthma and other chronic lower respiratory diseases: Secondary | ICD-10-CM | POA: Insufficient documentation

## 2021-08-14 DIAGNOSIS — I25119 Atherosclerotic heart disease of native coronary artery with unspecified angina pectoris: Secondary | ICD-10-CM

## 2021-08-14 DIAGNOSIS — I25118 Atherosclerotic heart disease of native coronary artery with other forms of angina pectoris: Secondary | ICD-10-CM | POA: Insufficient documentation

## 2021-08-14 DIAGNOSIS — I2510 Atherosclerotic heart disease of native coronary artery without angina pectoris: Secondary | ICD-10-CM | POA: Diagnosis present

## 2021-08-14 DIAGNOSIS — Z89512 Acquired absence of left leg below knee: Secondary | ICD-10-CM | POA: Insufficient documentation

## 2021-08-14 DIAGNOSIS — Z833 Family history of diabetes mellitus: Secondary | ICD-10-CM | POA: Insufficient documentation

## 2021-08-14 DIAGNOSIS — I119 Hypertensive heart disease without heart failure: Secondary | ICD-10-CM | POA: Insufficient documentation

## 2021-08-14 DIAGNOSIS — Z79899 Other long term (current) drug therapy: Secondary | ICD-10-CM | POA: Diagnosis not present

## 2021-08-14 DIAGNOSIS — E782 Mixed hyperlipidemia: Secondary | ICD-10-CM | POA: Insufficient documentation

## 2021-08-14 DIAGNOSIS — Z841 Family history of disorders of kidney and ureter: Secondary | ICD-10-CM | POA: Diagnosis not present

## 2021-08-14 DIAGNOSIS — E1165 Type 2 diabetes mellitus with hyperglycemia: Secondary | ICD-10-CM | POA: Insufficient documentation

## 2021-08-14 DIAGNOSIS — R9431 Abnormal electrocardiogram [ECG] [EKG]: Secondary | ICD-10-CM | POA: Diagnosis not present

## 2021-08-14 DIAGNOSIS — Z794 Long term (current) use of insulin: Secondary | ICD-10-CM | POA: Insufficient documentation

## 2021-08-14 DIAGNOSIS — I25708 Atherosclerosis of coronary artery bypass graft(s), unspecified, with other forms of angina pectoris: Secondary | ICD-10-CM | POA: Insufficient documentation

## 2021-08-14 DIAGNOSIS — R600 Localized edema: Secondary | ICD-10-CM | POA: Diagnosis not present

## 2021-08-14 DIAGNOSIS — Z8249 Family history of ischemic heart disease and other diseases of the circulatory system: Secondary | ICD-10-CM | POA: Diagnosis not present

## 2021-08-14 DIAGNOSIS — E1151 Type 2 diabetes mellitus with diabetic peripheral angiopathy without gangrene: Secondary | ICD-10-CM | POA: Diagnosis not present

## 2021-08-14 DIAGNOSIS — I452 Bifascicular block: Secondary | ICD-10-CM | POA: Diagnosis not present

## 2021-08-14 DIAGNOSIS — N179 Acute kidney failure, unspecified: Secondary | ICD-10-CM | POA: Insufficient documentation

## 2021-08-14 DIAGNOSIS — E785 Hyperlipidemia, unspecified: Secondary | ICD-10-CM | POA: Diagnosis not present

## 2021-08-14 DIAGNOSIS — Z7985 Long-term (current) use of injectable non-insulin antidiabetic drugs: Secondary | ICD-10-CM | POA: Insufficient documentation

## 2021-08-14 DIAGNOSIS — Z6841 Body Mass Index (BMI) 40.0 and over, adult: Secondary | ICD-10-CM | POA: Diagnosis not present

## 2021-08-14 DIAGNOSIS — I480 Paroxysmal atrial fibrillation: Secondary | ICD-10-CM | POA: Insufficient documentation

## 2021-08-14 DIAGNOSIS — R079 Chest pain, unspecified: Secondary | ICD-10-CM

## 2021-08-14 DIAGNOSIS — J449 Chronic obstructive pulmonary disease, unspecified: Secondary | ICD-10-CM | POA: Insufficient documentation

## 2021-08-14 HISTORY — PX: LEFT HEART CATH AND CORONARY ANGIOGRAPHY: CATH118249

## 2021-08-14 LAB — GLUCOSE, CAPILLARY
Glucose-Capillary: 438 mg/dL — ABNORMAL HIGH (ref 70–99)
Glucose-Capillary: 430 mg/dL — ABNORMAL HIGH (ref 70–99)
Glucose-Capillary: 409 mg/dL — ABNORMAL HIGH (ref 70–99)
Glucose-Capillary: 391 mg/dL — ABNORMAL HIGH (ref 70–99)
Glucose-Capillary: 456 mg/dL — ABNORMAL HIGH (ref 70–99)

## 2021-08-14 SURGERY — LEFT HEART CATH AND CORONARY ANGIOGRAPHY
Anesthesia: LOCAL

## 2021-08-14 MED ORDER — ONDANSETRON HCL 4 MG/2ML IJ SOLN
4.0000 mg | Freq: Four times a day (QID) | INTRAMUSCULAR | Status: DC | PRN
  Administered 2021-08-14: 4 mg via INTRAVENOUS
  Filled 2021-08-14: qty 2

## 2021-08-14 MED ORDER — GERHARDT'S BUTT CREAM
TOPICAL_CREAM | Freq: Two times a day (BID) | CUTANEOUS | Status: DC
  Filled 2021-08-14: qty 1

## 2021-08-14 MED ORDER — ASPIRIN 81 MG PO CHEW: 81.0000 mg | CHEWABLE_TABLET | Freq: Every day | ORAL | Status: DC

## 2021-08-14 MED ORDER — SODIUM CHLORIDE 0.9% FLUSH
3.0000 mL | Freq: Two times a day (BID) | INTRAVENOUS | Status: DC
  Administered 2021-08-14 – 2021-08-15 (×2): 3 mL via INTRAVENOUS

## 2021-08-14 MED ORDER — SODIUM CHLORIDE 0.9 % WEIGHT BASED INFUSION
3.0000 mL/kg/h | INTRAVENOUS | Status: AC
  Administered 2021-08-14: 3 mL/kg/h via INTRAVENOUS

## 2021-08-14 MED ORDER — GLIPIZIDE 10 MG PO TABS
10.0000 mg | ORAL_TABLET | Freq: Every day | ORAL | Status: DC
  Administered 2021-08-15: 10 mg via ORAL
  Filled 2021-08-14: qty 1

## 2021-08-14 MED ORDER — HEPARIN SODIUM (PORCINE) 1000 UNIT/ML IJ SOLN
INTRAMUSCULAR | Status: AC
  Filled 2021-08-14: qty 10

## 2021-08-14 MED ORDER — ORAL CARE MOUTH RINSE: 15.0000 mL | OROMUCOSAL | Status: DC | PRN

## 2021-08-14 MED ORDER — ONDANSETRON HCL 4 MG/2ML IJ SOLN
INTRAMUSCULAR | Status: AC
  Filled 2021-08-14: qty 2

## 2021-08-14 MED ORDER — INSULIN ASPART 100 UNIT/ML IJ SOLN
5.0000 [IU] | Freq: Once | INTRAMUSCULAR | Status: AC
  Administered 2021-08-14: 5 [IU] via SUBCUTANEOUS
  Filled 2021-08-14: qty 1

## 2021-08-14 MED ORDER — INSULIN REGULAR HUMAN (CONC) 500 UNIT/ML ~~LOC~~ SOPN
80.0000 [IU] | PEN_INJECTOR | Freq: Three times a day (TID) | SUBCUTANEOUS | Status: DC
  Administered 2021-08-14 – 2021-08-15 (×2): 80 [IU] via SUBCUTANEOUS
  Filled 2021-08-14: qty 3

## 2021-08-14 MED ORDER — DOCUSATE SODIUM 100 MG PO CAPS
100.0000 mg | ORAL_CAPSULE | Freq: Two times a day (BID) | ORAL | Status: DC | PRN
Start: 2021-08-14 — End: 2021-08-15

## 2021-08-14 MED ORDER — LIDOCAINE HCL (PF) 1 % IJ SOLN
INTRAMUSCULAR | Status: AC
  Filled 2021-08-14: qty 30

## 2021-08-14 MED ORDER — SODIUM CHLORIDE 0.9% FLUSH: 3.0000 mL | INTRAVENOUS | Status: DC | PRN

## 2021-08-14 MED ORDER — INSULIN DETEMIR 100 UNIT/ML ~~LOC~~ SOLN
30.0000 [IU] | Freq: Every day | SUBCUTANEOUS | Status: DC
  Administered 2021-08-14: 30 [IU] via SUBCUTANEOUS
  Filled 2021-08-14 (×2): qty 0.3

## 2021-08-14 MED ORDER — NITROGLYCERIN 0.4 MG SL SUBL
0.4000 mg | SUBLINGUAL_TABLET | SUBLINGUAL | Status: DC | PRN
Start: 2021-08-14 — End: 2021-08-14
  Administered 2021-08-14: 0.4 mg via SUBLINGUAL

## 2021-08-14 MED ORDER — VERAPAMIL HCL 2.5 MG/ML IV SOLN
INTRAVENOUS | Status: AC
  Filled 2021-08-14: qty 2

## 2021-08-14 MED ORDER — ROSUVASTATIN CALCIUM 20 MG PO TABS
40.0000 mg | ORAL_TABLET | Freq: Every day | ORAL | Status: DC
  Administered 2021-08-14: 40 mg via ORAL
  Filled 2021-08-14: qty 2

## 2021-08-14 MED ORDER — SODIUM CHLORIDE 0.9 % WEIGHT BASED INFUSION
1.0000 mL/kg/h | INTRAVENOUS | Status: DC
  Administered 2021-08-14: 1 mL/kg/h via INTRAVENOUS

## 2021-08-14 MED ORDER — METOPROLOL TARTRATE 50 MG PO TABS
50.0000 mg | ORAL_TABLET | Freq: Two times a day (BID) | ORAL | Status: DC
  Administered 2021-08-14 – 2021-08-15 (×2): 50 mg via ORAL
  Filled 2021-08-14 (×2): qty 1

## 2021-08-14 MED ORDER — HYDRALAZINE HCL 20 MG/ML IJ SOLN
INTRAMUSCULAR | Status: DC | PRN
  Administered 2021-08-14 (×2): 10 mg via INTRAVENOUS

## 2021-08-14 MED ORDER — SODIUM CHLORIDE 0.9 % IV SOLN: INTRAVENOUS | Status: AC

## 2021-08-14 MED ORDER — LOPERAMIDE HCL 2 MG PO CAPS: 2.0000 mg | ORAL_CAPSULE | Freq: Four times a day (QID) | ORAL | Status: DC | PRN

## 2021-08-14 MED ORDER — HYDRALAZINE HCL 20 MG/ML IJ SOLN
INTRAMUSCULAR | Status: AC
  Filled 2021-08-14: qty 1

## 2021-08-14 MED ORDER — NITROGLYCERIN 0.4 MG SL SUBL
SUBLINGUAL_TABLET | SUBLINGUAL | Status: AC
  Filled 2021-08-14: qty 3

## 2021-08-14 MED ORDER — SODIUM CHLORIDE 0.9 % IV SOLN: 250.0000 mL | INTRAVENOUS | Status: DC | PRN

## 2021-08-14 MED ORDER — BUPROPION HCL ER (SR) 100 MG PO TB12
100.0000 mg | ORAL_TABLET | Freq: Two times a day (BID) | ORAL | Status: DC
  Administered 2021-08-14 – 2021-08-15 (×2): 100 mg via ORAL
  Filled 2021-08-14 (×3): qty 1

## 2021-08-14 MED ORDER — INSULIN ASPART 100 UNIT/ML IJ SOLN
25.0000 [IU] | Freq: Once | INTRAMUSCULAR | Status: AC
  Administered 2021-08-14: 25 [IU] via SUBCUTANEOUS

## 2021-08-14 MED ORDER — DIAZEPAM 5 MG PO TABS: 5.0000 mg | ORAL_TABLET | ORAL | Status: DC | PRN

## 2021-08-14 MED ORDER — MIDAZOLAM HCL 2 MG/2ML IJ SOLN
INTRAMUSCULAR | Status: AC
  Filled 2021-08-14: qty 2

## 2021-08-14 MED ORDER — ALBUTEROL SULFATE HFA 108 (90 BASE) MCG/ACT IN AERS: 2.0000 | INHALATION_SPRAY | Freq: Four times a day (QID) | RESPIRATORY_TRACT | Status: DC | PRN

## 2021-08-14 MED ORDER — LIDOCAINE HCL (PF) 1 % IJ SOLN
INTRAMUSCULAR | Status: DC | PRN
  Administered 2021-08-14: 20 mg
  Administered 2021-08-14: 2 mL

## 2021-08-14 MED ORDER — ACETAMINOPHEN 500 MG PO TABS: 500.0000 mg | ORAL_TABLET | Freq: Four times a day (QID) | ORAL | Status: DC | PRN

## 2021-08-14 MED ORDER — MIDAZOLAM HCL 2 MG/2ML IJ SOLN
INTRAMUSCULAR | Status: DC | PRN
  Administered 2021-08-14: 1 mg via INTRAVENOUS
  Administered 2021-08-14: 2 mg via INTRAVENOUS

## 2021-08-14 MED ORDER — ONDANSETRON HCL 4 MG/2ML IJ SOLN
INTRAMUSCULAR | Status: DC | PRN
  Administered 2021-08-14: 4 mg via INTRAVENOUS

## 2021-08-14 MED ORDER — HYDRALAZINE HCL 20 MG/ML IJ SOLN: 10.0000 mg | INTRAMUSCULAR | Status: AC | PRN

## 2021-08-14 MED ORDER — VITAMIN D (ERGOCALCIFEROL) 1.25 MG (50000 UNIT) PO CAPS: 50000.0000 [IU] | ORAL_CAPSULE | ORAL | Status: DC

## 2021-08-14 MED ORDER — LABETALOL HCL 5 MG/ML IV SOLN: 10.0000 mg | INTRAVENOUS | Status: AC | PRN

## 2021-08-14 MED ORDER — HEPARIN (PORCINE) IN NACL 1000-0.9 UT/500ML-% IV SOLN
INTRAVENOUS | Status: AC
  Filled 2021-08-14: qty 1000

## 2021-08-14 MED ORDER — IOHEXOL 350 MG/ML SOLN
INTRAVENOUS | Status: DC | PRN
  Administered 2021-08-14: 65 mL

## 2021-08-14 MED ORDER — FENTANYL CITRATE (PF) 100 MCG/2ML IJ SOLN
INTRAMUSCULAR | Status: DC | PRN
Start: 2021-08-14 — End: 2021-08-14
  Administered 2021-08-14 (×2): 25 ug via INTRAVENOUS

## 2021-08-14 MED ORDER — CLOPIDOGREL BISULFATE 75 MG PO TABS
75.0000 mg | ORAL_TABLET | Freq: Every day | ORAL | Status: DC
  Administered 2021-08-15: 75 mg via ORAL
  Filled 2021-08-14: qty 1

## 2021-08-14 MED ORDER — ALBUTEROL SULFATE (2.5 MG/3ML) 0.083% IN NEBU: 2.5000 mg | INHALATION_SOLUTION | Freq: Four times a day (QID) | RESPIRATORY_TRACT | Status: DC | PRN

## 2021-08-14 MED ORDER — PANTOPRAZOLE SODIUM 40 MG PO TBEC
40.0000 mg | DELAYED_RELEASE_TABLET | Freq: Every day | ORAL | Status: DC
Start: 2021-08-14 — End: 2021-08-15
  Administered 2021-08-14: 40 mg via ORAL
  Filled 2021-08-14: qty 1

## 2021-08-14 MED ORDER — HEPARIN (PORCINE) IN NACL 1000-0.9 UT/500ML-% IV SOLN
INTRAVENOUS | Status: DC | PRN
  Administered 2021-08-14 (×2): 500 mL

## 2021-08-14 MED ORDER — FENTANYL CITRATE (PF) 100 MCG/2ML IJ SOLN
INTRAMUSCULAR | Status: AC
  Filled 2021-08-14: qty 2

## 2021-08-14 MED ORDER — INSULIN ASPART 100 UNIT/ML IJ SOLN
10.0000 [IU] | Freq: Once | INTRAMUSCULAR | Status: AC
  Administered 2021-08-14: 10 [IU] via SUBCUTANEOUS
  Filled 2021-08-14: qty 1

## 2021-08-14 MED ORDER — NITROGLYCERIN 0.4 MG SL SUBL
0.4000 mg | SUBLINGUAL_TABLET | SUBLINGUAL | Status: DC | PRN
Start: 2021-08-14 — End: 2021-08-15

## 2021-08-14 MED ORDER — ADULT MULTIVITAMIN W/MINERALS CH
1.0000 | ORAL_TABLET | Freq: Every day | ORAL | Status: DC
  Administered 2021-08-14 – 2021-08-15 (×2): 1 via ORAL
  Filled 2021-08-14 (×2): qty 1

## 2021-08-14 MED ORDER — ACETAMINOPHEN 325 MG PO TABS
650.0000 mg | ORAL_TABLET | ORAL | Status: DC | PRN
  Administered 2021-08-14: 650 mg via ORAL
  Filled 2021-08-14: qty 2

## 2021-08-14 MED ORDER — CLOPIDOGREL BISULFATE 75 MG PO TABS: 75.0000 mg | ORAL_TABLET | Freq: Every day | ORAL | Status: DC

## 2021-08-14 SURGICAL SUPPLY — 11 items
CATH INFINITI 5FR MULTPACK ANG (CATHETERS) ×1 IMPLANT
CLOSURE MYNX CONTROL 5F (Vascular Products) ×1 IMPLANT
GLIDESHEATH SLEND SS 6F .021 (SHEATH) ×1 IMPLANT
GUIDEWIRE INQWIRE 1.5J.035X260 (WIRE) IMPLANT
INQWIRE 1.5J .035X260CM (WIRE) ×2
KIT HEART LEFT (KITS) ×2 IMPLANT
PACK CARDIAC CATHETERIZATION (CUSTOM PROCEDURE TRAY) ×2 IMPLANT
SHEATH PINNACLE 5F 10CM (SHEATH) ×1 IMPLANT
SHEATH PROBE COVER 6X72 (BAG) ×1 IMPLANT
TRANSDUCER W/STOPCOCK (MISCELLANEOUS) ×2 IMPLANT
TUBING CIL FLEX 10 FLL-RA (TUBING) ×2 IMPLANT

## 2021-08-14 NOTE — Interval H&P Note (Signed)
Cath Lab Visit (complete for each Cath Lab visit)  Clinical Evaluation Leading to the Procedure:   ACS: No.  Non-ACS:    Anginal Classification: CCS II  Anti-ischemic medical therapy: Minimal Therapy (1 class of medications)  Non-Invasive Test Results: No non-invasive testing performed  Prior CABG: No previous CABG      History and Physical Interval Note:  08/14/2021 2:16 PM  Nicole [name]  has presented today for surgery, with the diagnosis of chest pain - cad.  The various methods of treatment have been discussed with the patient and family. After consideration of risks, benefits and other options for treatment, the patient has consented to  Procedure(s): LEFT HEART CATH AND CORONARY ANGIOGRAPHY (N/A) as a surgical intervention.  The patient's history has been reviewed, patient examined, no change in status, stable for surgery.  I have reviewed the patient's chart and labs.  Questions were answered to the patient's satisfaction.     Nicole [name]

## 2021-08-15 ENCOUNTER — Encounter (HOSPITAL_COMMUNITY): Payer: Self-pay | Admitting: Cardiovascular Disease

## 2021-08-15 ENCOUNTER — Other Ambulatory Visit: Payer: Self-pay | Admitting: Physician Assistant

## 2021-08-15 DIAGNOSIS — Z833 Family history of diabetes mellitus: Secondary | ICD-10-CM | POA: Diagnosis not present

## 2021-08-15 DIAGNOSIS — I159 Secondary hypertension, unspecified: Secondary | ICD-10-CM | POA: Diagnosis not present

## 2021-08-15 DIAGNOSIS — R9431 Abnormal electrocardiogram [ECG] [EKG]: Secondary | ICD-10-CM | POA: Diagnosis not present

## 2021-08-15 DIAGNOSIS — Z7984 Long term (current) use of oral hypoglycemic drugs: Secondary | ICD-10-CM | POA: Diagnosis not present

## 2021-08-15 DIAGNOSIS — Z886 Allergy status to analgesic agent status: Secondary | ICD-10-CM | POA: Diagnosis not present

## 2021-08-15 DIAGNOSIS — I119 Hypertensive heart disease without heart failure: Secondary | ICD-10-CM | POA: Diagnosis not present

## 2021-08-15 DIAGNOSIS — N179 Acute kidney failure, unspecified: Secondary | ICD-10-CM

## 2021-08-15 DIAGNOSIS — E1151 Type 2 diabetes mellitus with diabetic peripheral angiopathy without gangrene: Secondary | ICD-10-CM | POA: Diagnosis not present

## 2021-08-15 DIAGNOSIS — Z841 Family history of disorders of kidney and ureter: Secondary | ICD-10-CM | POA: Diagnosis not present

## 2021-08-15 DIAGNOSIS — Z825 Family history of asthma and other chronic lower respiratory diseases: Secondary | ICD-10-CM | POA: Diagnosis not present

## 2021-08-15 DIAGNOSIS — R600 Localized edema: Secondary | ICD-10-CM | POA: Diagnosis not present

## 2021-08-15 DIAGNOSIS — E781 Pure hyperglyceridemia: Secondary | ICD-10-CM | POA: Diagnosis not present

## 2021-08-15 DIAGNOSIS — Z794 Long term (current) use of insulin: Secondary | ICD-10-CM | POA: Diagnosis not present

## 2021-08-15 DIAGNOSIS — E782 Mixed hyperlipidemia: Secondary | ICD-10-CM | POA: Diagnosis not present

## 2021-08-15 DIAGNOSIS — Z7902 Long term (current) use of antithrombotics/antiplatelets: Secondary | ICD-10-CM | POA: Diagnosis not present

## 2021-08-15 DIAGNOSIS — Z955 Presence of coronary angioplasty implant and graft: Secondary | ICD-10-CM | POA: Diagnosis not present

## 2021-08-15 DIAGNOSIS — I452 Bifascicular block: Secondary | ICD-10-CM | POA: Diagnosis not present

## 2021-08-15 DIAGNOSIS — I2510 Atherosclerotic heart disease of native coronary artery without angina pectoris: Secondary | ICD-10-CM

## 2021-08-15 DIAGNOSIS — I25118 Atherosclerotic heart disease of native coronary artery with other forms of angina pectoris: Secondary | ICD-10-CM | POA: Diagnosis not present

## 2021-08-15 DIAGNOSIS — Z7985 Long-term (current) use of injectable non-insulin antidiabetic drugs: Secondary | ICD-10-CM | POA: Diagnosis not present

## 2021-08-15 DIAGNOSIS — Z79899 Other long term (current) drug therapy: Secondary | ICD-10-CM | POA: Diagnosis not present

## 2021-08-15 DIAGNOSIS — Z87891 Personal history of nicotine dependence: Secondary | ICD-10-CM | POA: Diagnosis not present

## 2021-08-15 DIAGNOSIS — J449 Chronic obstructive pulmonary disease, unspecified: Secondary | ICD-10-CM | POA: Diagnosis not present

## 2021-08-15 DIAGNOSIS — Z89512 Acquired absence of left leg below knee: Secondary | ICD-10-CM | POA: Diagnosis not present

## 2021-08-15 DIAGNOSIS — Z8249 Family history of ischemic heart disease and other diseases of the circulatory system: Secondary | ICD-10-CM | POA: Diagnosis not present

## 2021-08-15 LAB — BASIC METABOLIC PANEL
Anion gap: 14 (ref 5–15)
BUN: 25 mg/dL — ABNORMAL HIGH (ref 6–20)
CO2: 19 mmol/L — ABNORMAL LOW (ref 22–32)
Calcium: 9 mg/dL (ref 8.9–10.3)
Chloride: 104 mmol/L (ref 98–111)
Creatinine, Ser: 1.28 mg/dL — ABNORMAL HIGH (ref 0.44–1.00)
GFR, Estimated: 49 mL/min — ABNORMAL LOW (ref >60)
Glucose, Bld: 253 mg/dL — ABNORMAL HIGH (ref 70–99)
Potassium: 4.3 mmol/L (ref 3.5–5.1)
Sodium: 137 mmol/L (ref 135–145)

## 2021-08-15 LAB — GLUCOSE, CAPILLARY
Glucose-Capillary: 123 mg/dL — ABNORMAL HIGH (ref 70–99)
Glucose-Capillary: 181 mg/dL — ABNORMAL HIGH (ref 70–99)
Glucose-Capillary: 304 mg/dL — ABNORMAL HIGH (ref 70–99)
Glucose-Capillary: 96 mg/dL (ref 70–99)

## 2021-08-15 LAB — CBC
HCT: 36.9 % (ref 36.0–46.0)
Hemoglobin: 12.7 g/dL (ref 12.0–15.0)
MCH: 31.5 pg (ref 26.0–34.0)
MCHC: 34.4 g/dL (ref 30.0–36.0)
MCV: 91.6 fL (ref 80.0–100.0)
Platelets: 197 10*3/uL (ref 150–400)
RBC: 4.03 MIL/uL (ref 3.87–5.11)
RDW: 13.6 % (ref 11.5–15.5)
WBC: 10.9 10*3/uL — ABNORMAL HIGH (ref 4.0–10.5)
nRBC: 0 % (ref 0.0–0.2)

## 2021-08-15 LAB — LIPID PANEL
Cholesterol: 100 mg/dL (ref 0–200)
HDL: 26 mg/dL — ABNORMAL LOW (ref >40)
LDL Cholesterol: 31 mg/dL (ref 0–99)
Total CHOL/HDL Ratio: 3.8 RATIO
Triglycerides: 217 mg/dL — ABNORMAL HIGH (ref <150)
VLDL: 43 mg/dL — ABNORMAL HIGH (ref 0–40)

## 2021-08-15 MED ORDER — GERHARDT'S BUTT CREAM: 1.0000 | TOPICAL_CREAM | Freq: Two times a day (BID) | CUTANEOUS | 0 refills | Status: DC

## 2021-08-18 LAB — LIPOPROTEIN A (LPA): Lipoprotein (a): 10.1 nmol/L (ref <75.0)

## 2021-08-18 MED FILL — Heparin Sodium (Porcine) Inj 1000 Unit/ML: INTRAMUSCULAR | Qty: 10 | Status: AC

## 2021-08-18 MED FILL — Verapamil HCl IV Soln 2.5 MG/ML: INTRAVENOUS | Qty: 2 | Status: AC

## 2021-08-19 ENCOUNTER — Other Ambulatory Visit (HOSPITAL_COMMUNITY)
Admission: RE | Admit: 2021-08-19 | Discharge: 2021-08-19 | Disposition: A | Discharge status: Home / Self Care | Payer: Medicare Other | Source: Ambulatory Visit | Attending: Physician Assistant | Admitting: Physician Assistant

## 2021-08-19 DIAGNOSIS — N179 Acute kidney failure, unspecified: Secondary | ICD-10-CM | POA: Diagnosis not present

## 2021-08-19 DIAGNOSIS — J449 Chronic obstructive pulmonary disease, unspecified: Secondary | ICD-10-CM | POA: Diagnosis not present

## 2021-08-19 LAB — BASIC METABOLIC PANEL
Anion gap: 8 (ref 5–15)
BUN: 20 mg/dL (ref 6–20)
CO2: 29 mmol/L (ref 22–32)
Calcium: 9.3 mg/dL (ref 8.9–10.3)
Chloride: 100 mmol/L (ref 98–111)
Creatinine, Ser: 1.04 mg/dL — ABNORMAL HIGH (ref 0.44–1.00)
GFR, Estimated: >60 mL/min (ref >60)
Glucose, Bld: 203 mg/dL — ABNORMAL HIGH (ref 70–99)
Potassium: 3.8 mmol/L (ref 3.5–5.1)
Sodium: 137 mmol/L (ref 135–145)

## 2021-08-21 DIAGNOSIS — E1165 Type 2 diabetes mellitus with hyperglycemia: Secondary | ICD-10-CM | POA: Diagnosis not present

## 2021-08-21 DIAGNOSIS — R195 Other fecal abnormalities: Secondary | ICD-10-CM | POA: Diagnosis not present

## 2021-08-21 DIAGNOSIS — M1990 Unspecified osteoarthritis, unspecified site: Secondary | ICD-10-CM | POA: Diagnosis not present

## 2021-08-21 DIAGNOSIS — I509 Heart failure, unspecified: Secondary | ICD-10-CM | POA: Diagnosis not present

## 2021-08-21 DIAGNOSIS — M6281 Muscle weakness (generalized): Secondary | ICD-10-CM | POA: Diagnosis not present

## 2021-08-21 DIAGNOSIS — E785 Hyperlipidemia, unspecified: Secondary | ICD-10-CM | POA: Diagnosis not present

## 2021-08-21 DIAGNOSIS — K219 Gastro-esophageal reflux disease without esophagitis: Secondary | ICD-10-CM | POA: Diagnosis not present

## 2021-08-21 DIAGNOSIS — I25119 Atherosclerotic heart disease of native coronary artery with unspecified angina pectoris: Secondary | ICD-10-CM | POA: Diagnosis not present

## 2021-08-21 DIAGNOSIS — E1169 Type 2 diabetes mellitus with other specified complication: Secondary | ICD-10-CM | POA: Diagnosis not present

## 2021-08-21 DIAGNOSIS — J449 Chronic obstructive pulmonary disease, unspecified: Secondary | ICD-10-CM | POA: Diagnosis not present

## 2021-08-21 DIAGNOSIS — I4891 Unspecified atrial fibrillation: Secondary | ICD-10-CM | POA: Diagnosis not present

## 2021-08-22 DIAGNOSIS — M6281 Muscle weakness (generalized): Secondary | ICD-10-CM | POA: Diagnosis not present

## 2021-08-23 ENCOUNTER — Other Ambulatory Visit: Payer: Self-pay | Admitting: Family

## 2021-08-23 DIAGNOSIS — J449 Chronic obstructive pulmonary disease, unspecified: Secondary | ICD-10-CM | POA: Diagnosis not present

## 2021-08-24 NOTE — Telephone Encounter (Signed)
Prescriber no PCP in this practice. Requested Prescriptions  Pending Prescriptions Disp Refills  . celecoxib (CELEBREX) 200 MG capsule [Pharmacy Med Name: Celecoxib 200 MG Oral Capsule] 180 capsule 0    Sig: Take 1 capsule by mouth twice daily     Analgesics:  COX2 Inhibitors Failed - 08/23/2021 11:34 AM      Failed - Manual Review: Labs are only required if the patient has taken medication for more than 8 weeks.      Failed - Cr in normal range and within 360 days    Creat  Date Value Ref Range Status  07/30/2020 0.86 0.50 - 1.05 mg/dL Final    Comment:    For patients >61 years of age, the reference limit for Creatinine is approximately 13% higher for people identified as African-American. .    Creatinine, Ser  Date Value Ref Range Status  08/19/2021 1.04 (H) 0.44 - 1.00 mg/dL Final         Passed - HGB in normal range and within 360 days    Hemoglobin  Date Value Ref Range Status  08/15/2021 12.7 12.0 - 15.0 g/dL Final         Passed - HCT in normal range and within 360 days    HCT  Date Value Ref Range Status  08/15/2021 36.9 36.0 - 46.0 % Final         Passed - AST in normal range and within 360 days    AST  Date Value Ref Range Status  06/16/2021 24 15 - 41 U/L Final         Passed - ALT in normal range and within 360 days    ALT  Date Value Ref Range Status  06/16/2021 14 0 - 44 U/L Final         Passed - eGFR is 30 or above and within 360 days    GFR, Est African American  Date Value Ref Range Status  07/30/2020 88 > OR = 60 mL/min/1.81m Final   GFR, Est Non African American  Date Value Ref Range Status  07/30/2020 76 > OR = 60 mL/min/1.742mFinal   GFR, Estimated  Date Value Ref Range Status  08/19/2021 >60 >60 mL/min Final    Comment:    (NOTE) Calculated using the CKD-EPI Creatinine Equation (2021)          Passed - Patient is not pregnant      Passed - Valid encounter within last 12 months    Recent Outpatient Visits          8  months ago Encounter to establish care   [facility]      Future Appointments            In 2 weeks [facility]

## 2021-08-31 ENCOUNTER — Other Ambulatory Visit: Payer: Self-pay | Admitting: Family

## 2021-09-03 ENCOUNTER — Ambulatory Visit: Payer: Medicaid Other | Admitting: Nurse Practitioner

## 2021-09-09 ENCOUNTER — Ambulatory Visit (INDEPENDENT_AMBULATORY_CARE_PROVIDER_SITE_OTHER): Payer: Medicare Other | Admitting: Student

## 2021-09-09 ENCOUNTER — Encounter: Payer: Self-pay | Admitting: Student

## 2021-09-09 VITALS — BP 128/62 | HR 67 | Ht 63.5 in | Wt 295.0 lb

## 2021-09-09 DIAGNOSIS — R600 Localized edema: Secondary | ICD-10-CM

## 2021-09-09 DIAGNOSIS — E785 Hyperlipidemia, unspecified: Secondary | ICD-10-CM

## 2021-09-09 DIAGNOSIS — I25118 Atherosclerotic heart disease of native coronary artery with other forms of angina pectoris: Secondary | ICD-10-CM | POA: Diagnosis not present

## 2021-09-09 DIAGNOSIS — I10 Essential (primary) hypertension: Secondary | ICD-10-CM | POA: Diagnosis not present

## 2021-09-09 DIAGNOSIS — Z8679 Personal history of other diseases of the circulatory system: Secondary | ICD-10-CM

## 2021-09-09 MED ORDER — CLOPIDOGREL BISULFATE 75 MG PO TABS: 75.0000 mg | ORAL_TABLET | Freq: Every day | ORAL | 3 refills | Status: DC

## 2021-09-09 NOTE — Progress Notes (Signed)
Cardiology Office Note    Date:  09/09/2021   ID:  [name], DOB Sep 13, 1965, MRN [id number]  PCP:  Celene Squibb, MD  Cardiologist: Dorris Carnes, MD    Chief Complaint  Patient presents with   Follow-up    S/p catheterization    History of Present Illness:    MARLINDA [name] is a 56 y.o. female with past medical history of CAD (s/p stenting in 2009, low-risk NST in 2013, reported 2 more stents in [place] in 2020), HTN, HLD, PAD (s/p L BKA), COPD and Type 2 DM who presents to the office today for follow-up of her recent cardiac catheterization.  She was examined by myself in 07/2021 and reported more frequent episodes of chest pain for the past 2 weeks which resembled her prior angina. She had previously undergone false negative stress tests and given her symptoms and cardiac risk factors, a cardiac catheterization was recommended for definitive evaluation. This was performed by Dr. Claiborne Billings on 08/14/2021 and showed patent stents along the RCA and mid LAD with only mild nonobstructive disease elsewhere. EF was normal at 55% with LVEDP at 23 mmHg.  Was recommended to continue Plavix given her ASA allergy and to optimize control of HLD, HTN and Type 2 DM. She was monitored overnight and discharged home the following morning. Creatinine was elevated at 1.28 on the day of hospital discharge but rechecked on 08/19/2021 and improved to 1.04.  In talking with the patient today, she reports still having episodic chest pain but this is now typically worse with positional changes. Reports her breathing has been stable and she is on supplemental oxygen at baseline. Her dyspnea does worsen in the warm temperatures as she reports her current home does not have central air. She denies any specific orthopnea or PND. Does experience intermittent lower extremity edema but reports great urination with Lasix 60 mg daily.   Past Medical History:  Diagnosis Date   CAP (community acquired pneumonia)     Streptococcus 01/2011   COPD (chronic obstructive pulmonary disease) (HCC)    Coronary atherosclerosis of native coronary artery    a. Diagnosed [place] 2006 - DES RCA, reports followup cath 2009 at [facility] b. reported 2 stents in [place] in 2020. c. 07/2021: cath showing patent stents along RCA and mid-LAD with scattered 20% stenosis but no obstructive disease.   Dyslipidemia    Essential hypertension, benign    Morbid obesity ([place])    Pancreatitis    Type 2 diabetes mellitus ([place])     Past Surgical History:  Procedure Laterality Date   ABDOMINAL HERNIA REPAIR     ABDOMINAL HYSTERECTOMY     AMPUTATION Left 01/25/2020   Procedure: LEFT BELOW KNEE AMPUTATION;  Surgeon: Newt Minion, MD;  Location: [facility];  Service: Orthopedics;  Laterality: Left;   CORONARY ANGIOPLASTY WITH STENT PLACEMENT  2006   KNEE SURGERY     LEFT HEART CATH AND CORONARY ANGIOGRAPHY N/A 08/14/2021   Procedure: LEFT HEART CATH AND CORONARY ANGIOGRAPHY;  Surgeon: Troy Sine, MD;  Location: [facility];  Service: Cardiovascular;  Laterality: N/A;   NOSE SURGERY     Pilonidal cystectomy     TONSILLECTOMY      Current Medications: Outpatient Medications Prior to Visit  Medication Sig Dispense Refill   ACCU-CHEK GUIDE test strip USE 1 STRIP TO CHECK GLUCOSE 4 TIMES DAILY 400 each 1   acetaminophen (TYLENOL) 500 MG tablet Take 1 tablet (500 mg total) by  mouth every 6 (six) hours as needed for moderate pain. 30 tablet 0   albuterol (PROVENTIL) (2.5 MG/3ML) 0.083% nebulizer solution Take 3 mLs (2.5 mg total) by nebulization every 6 (six) hours as needed for wheezing. 75 mL 12   albuterol (VENTOLIN HFA) 108 (90 Base) MCG/ACT inhaler Inhale 2 puffs into the lungs every 6 (six) hours as needed for wheezing. Shortness of breath 18 g 3   blood glucose meter kit and supplies Dispense based on patient and insurance preference. Use four times daily as directed. (FOR ICD-10 E10.9, E11.9). 1 each 0   buPROPion ER  (WELLBUTRIN SR) 100 MG 12 hr tablet Take 1 tablet (100 mg total) by mouth 2 (two) times daily. 60 tablet 1   celecoxib (CELEBREX) 200 MG capsule Take 200 mg by mouth 2 (two) times daily.     docusate sodium (COLACE) 100 MG capsule Take 1 capsule (100 mg total) by mouth 2 (two) times daily as needed for mild constipation. 60 capsule 2   EASY TOUCH INSULIN SYRINGE 31G X 5/16" 1 ML MISC      EPINEPHrine 0.3 mg/0.3 mL IJ SOAJ injection Inject 0.3 mg into the muscle as needed for anaphylaxis. 1 each 2   furosemide (LASIX) 40 MG tablet Take 1.5 tablets (60 mg total) by mouth daily. 45 tablet 11   glipiZIDE (GLUCOTROL) 10 MG tablet Take 1 tablet (10 mg total) by mouth in the morning and at bedtime. 60 tablet 1   Insulin Pen Needle (B-D ULTRAFINE III SHORT PEN) 31G X 8 MM MISC USE AS DIRECTED 30 each 0   insulin regular human CONCENTRATED (HUMULIN R U-500 KWIKPEN) 500 UNIT/ML KwikPen Inject 80 Units into the skin 3 (three) times daily with meals. 45 mL 3   lisinopril (ZESTRIL) 10 MG tablet Take 10 mg by mouth daily.     loperamide (IMODIUM) 2 MG capsule Take 1 capsule (2 mg total) by mouth every 6 (six) hours as needed for diarrhea or loose stools. 30 capsule 0   metoprolol tartrate (LOPRESSOR) 50 MG tablet Take 1 tablet (50 mg total) by mouth 2 (two) times daily. 180 tablet 1   Multiple Vitamins-Calcium (ONE-A-DAY WOMENS FORMULA) TABS Take 1 tablet by mouth daily.     nitroGLYCERIN (NITROSTAT) 0.4 MG SL tablet Take one tablet every 5 minutes as needed for chest pains up to 3 tablets. (Patient taking differently: Place 0.4 mg under the tongue every 5 (five) minutes as needed for chest pain (every 5 minutes as needed for chest pains up to 3 tablets.).) 25 tablet 3   Nystatin (GERHARDT'S BUTT CREAM) CREA Apply 1 Application topically 2 (two) times daily. 1 each 0   nystatin powder Apply 1 Application topically daily as needed (irritation).     pantoprazole (PROTONIX) 40 MG tablet Take 1 tablet (40 mg total) by  mouth daily. (Patient taking differently: Take 40 mg by mouth at bedtime.) 90 tablet 2   rosuvastatin (CRESTOR) 40 MG tablet Take 40 mg by mouth at bedtime.     TRULICITY 1.5 ER/1.5QM SOPN Inject 1.5 mg into the skin once a week. (Patient taking differently: Inject 1.5 mg into the skin every Thursday.) 2 mL 6   Vitamin D, Ergocalciferol, (DRISDOL) 1.25 MG (50000 UNIT) CAPS capsule Take 50,000 Units by mouth every Monday.     clopidogrel (PLAVIX) 75 MG tablet Take 1 tablet (75 mg total) by mouth daily. 90 tablet 1   No facility-administered medications prior to visit.     Allergies:  Aspirin, Bee venom, Pineapple, and E-mycin [erythromycin base]   Social History   Socioeconomic History   Marital status: Divorced    Spouse name: Not on file   Number of children: 3   Years of education: Not on file   Highest education level: Not on file  Occupational History   Occupation: Home health aide    Employer: [employer]  Tobacco Use   Smoking status: Former    Packs/day: 0.50    Years: 20.00    Total pack years: 10.00    Types: Cigarettes    Quit date: 01/22/2020    Years since quitting: 1.6   Smokeless tobacco: Never  Vaping Use   Vaping Use: Never used  Substance and Sexual Activity   Alcohol use: No   Drug use: No   Sexual activity: Yes    Birth control/protection: Surgical  Other Topics Concern   Not on file  Social History Narrative   Not on file   Social Determinants of Health   Financial Resource Strain: Not on file  Food Insecurity: Not on file  Transportation Needs: Not on file  Physical Activity: Not on file  Stress: Not on file  Social Connections: Not on file     Family History:  The patient's family history includes Asthma in her son; Diabetes in her mother; Heart failure in her mother; Hypertension in her mother; Kidney failure in her mother; Ovarian cancer in her mother.   Review of Systems:    Please see the history of present illness.      All other systems reviewed and are otherwise negative except as noted above.   Physical Exam:    VS:  BP 128/62   Pulse 67   Ht 5' 3.5" (1.613 m)   Wt 295 lb (133.8 kg)   SpO2 97%   BMI 51.44 kg/m    General: Pleasant obese female appearing in no acute distress. Sitting in wheelchair.  Head: Normocephalic, atraumatic. Neck: No carotid bruits. JVD not elevated.  Lungs: Respirations regular and unlabored, without wheezes or rales.  Heart: Regular rate and rhythm. No S3 or S4.  No murmur, no rubs, or gallops appreciated. Abdomen: Appears non-distended. No obvious abdominal masses. Msk:  Strength and tone appear normal for age. No obvious joint deformities or effusions. Extremities: No clubbing or cyanosis. No pitting edema. Left BKA.  Neuro: Alert and oriented X 3. Moves all extremities spontaneously. No focal deficits noted. Psych:  Responds to questions appropriately with a normal affect. Skin: No rashes or lesions noted  Wt Readings from Last 3 Encounters:  09/09/21 295 lb (133.8 kg)  08/14/21 291 lb (132 kg)  08/04/21 (!) 312 lb (141.5 kg)     Studies/Labs Reviewed:   EKG:  EKG is not ordered today.  Recent Labs: 06/16/2021: ALT 14 06/20/2021: Magnesium 1.6 08/15/2021: Hemoglobin 12.7; Platelets 197 08/19/2021: BUN 20; Creatinine, Ser 1.04; Potassium 3.8; Sodium 137   Lipid Panel    Component Value Date/Time   CHOL 100 08/15/2021 0123   TRIG 217 (H) 08/15/2021 0123   HDL 26 (L) 08/15/2021 0123   CHOLHDL 3.8 08/15/2021 0123   VLDL 43 (H) 08/15/2021 0123   LDLCALC 31 08/15/2021 0123   LDLCALC 65 12/25/2019 1359    Additional studies/ records that were reviewed today include:   LHC: 08/14/2021  1st Diag lesion is 20% stenosed.   Prox LAD lesion is 20% stenosed.   Mid LAD-1 lesion is 20% stenosed.   Dist RCA  lesion is 30% stenosed.   Prox RCA-2 lesion is 30% stenosed.   Previously placed Prox RCA-1 stent of unknown type is  widely patent.   Previously placed  Mid LAD-2 stent of unknown type is  widely patent.   The left ventricular systolic function is normal.   LV end diastolic pressure is mildly elevated.   The left ventricular ejection fraction is 55-65% by visual estimate.   Mild nonobstructive CAD with 20% stenosis in a very proximal diagonal (optional diagonal) vessel with 20% segmental proximal LAD stenosis with widely patent previously placed mid LAD stent.  The left circumflex coronary artery is angiographically normal.  The previously placed stent in the right coronary artery is widely patent and there is mild 30% mid and distal both stenoses.   Normal LV function with EF at 55%.  LVEDP 23 mmHg.   Minx closure device for right groin hemostasis.   RECOMMENDATION: Continue her prior Plavix Rx with ASA allergy.  Patient was hypertensive during the procedure and received 20 mg of hydralazine postprocedure.  Will need optimization of blood pressure control with target blood pressure less than 130/80.  Aggressive continued lipid-lowering therapy with target LDL less than 70.  Patient has poorly controlled diabetes mellitus which needs to be more optimally treated.  Due to the late hour of the procedure and the patient's glucose greater than 400 today, we will keep patient overnight for observation with plans for optimization of therapy and discharge tomorrow.    Assessment:    1. Coronary artery disease involving native coronary artery of native heart with other form of angina pectoris ([place])   2. Essential hypertension   3. Hyperlipidemia LDL goal <70   4. Bilateral lower extremity edema   5. History of atrial fibrillation      Plan:   In order of problems listed above:  1. CAD/Chest Pain - She is s/p stenting in 2009 with low-risk NST in 2013 and she reported 2 more stents in [place] in 2020.  Catheterization last month showed patent stents along the RCA and mid LAD with only mild nonobstructive disease elsewhere as outlined above.  -  She reports still having episodic chest pain but this is typically worse with positional changes and overall sounds most consistent with musculoskeletal discomfort. - Continue current medical therapy with Plavix 75 mg daily (allergic to ASA), Lopressor 50 mg twice daily and Crestor 40 mg daily.  2. HTN - BP was initially elevated, rechecked and improved to 128/62 during today's visit. Continue current medical therapy with Lisinopril 10 mg daily and Lopressor 50 mg twice daily.  3. HLD - Recent FLP last month showed total cholesterol 100, triglycerides 217, HDL 26 and LDL 31. She remains on Crestor 40 mg daily.  4. Lower Extremity Edema - Previously felt to be secondary to dependent edema as she is mostly wheelchair-bound at baseline.  Remains on Lasix 60 mg daily and recent BMET showed her creatinine had improved to 1.04.  5. Persistent Atrial Fibrillation - This was diagnosed during admission in 09/2019 while in [place] and she was on Eliquis at that time but this was discontinued in the interim. She has maintained normal sinus rhythm by review of most recent EKG's. If she were to have recurrence, would need to restart Eliquis for anticoagulation. Reports that she was very symptomatic with her arrhythmia in the past and I encouraged her to make [place] aware if she develops any new palpitations or elevated HR.     Medication  Adjustments/Labs and Tests Ordered: Current medicines are reviewed at length with the patient today.  Concerns regarding medicines are outlined above.  Medication changes, Labs and Tests ordered today are listed in the Patient Instructions below. Patient Instructions  Medication Instructions:  Your physician recommends that you continue on your current medications as directed. Please refer to the Current Medication list given to you today.   Labwork: None today  Testing/Procedures: None today  Follow-Up: 6 months Dr.Ross  Any Other Special Instructions Will Be  Listed Below (If Applicable).  If you need a refill on your cardiac medications before your next appointment, please call your pharmacy.    Signed, Erma Heritage, PA-C  09/09/2021 5:32 PM    [facility] [address] [place], [place] [address] Phone: [phone] Fax: [phone]

## 2021-09-09 NOTE — Patient Instructions (Signed)
Medication Instructions:  Your physician recommends that you continue on your current medications as directed. Please refer to the Current Medication list given to you today.   Labwork: None today  Testing/Procedures: None today  Follow-Up: 6 months Dr.Ross  Any Other Special Instructions Will Be Listed Below (If Applicable).  If you need a refill on your cardiac medications before your next appointment, please call your pharmacy.

## 2021-09-18 DIAGNOSIS — J449 Chronic obstructive pulmonary disease, unspecified: Secondary | ICD-10-CM | POA: Diagnosis not present

## 2021-09-22 ENCOUNTER — Encounter: Payer: Self-pay | Admitting: Emergency Medicine

## 2021-09-22 ENCOUNTER — Ambulatory Visit
Admission: EM | Admit: 2021-09-22 | Discharge: 2021-09-22 | Disposition: A | Discharge status: Home / Self Care | Payer: Medicare Other | Attending: Nurse Practitioner | Admitting: Nurse Practitioner

## 2021-09-22 DIAGNOSIS — H60502 Unspecified acute noninfective otitis externa, left ear: Secondary | ICD-10-CM | POA: Diagnosis not present

## 2021-09-22 DIAGNOSIS — H6121 Impacted cerumen, right ear: Secondary | ICD-10-CM

## 2021-09-22 MED ORDER — CARBAMIDE PEROXIDE 6.5 % OT SOLN
5.0000 [drp] | Freq: Two times a day (BID) | OTIC | 0 refills | Status: DC
Start: 2021-09-22 — End: 2021-09-29

## 2021-09-22 MED ORDER — OFLOXACIN 0.3 % OT SOLN: 10.0000 [drp] | Freq: Every day | OTIC | 0 refills | Status: DC

## 2021-09-22 NOTE — ED Provider Notes (Signed)
[facility]    CSN: [id number] Arrival date & time: 09/22/21  1537      History   Chief Complaint No chief complaint on file.   HPI Nicole [name] is a 56 y.o. female.   Patient presents with bilateral ear pain, worse on the right that started a few days ago.  Reports she has been swimming with her grandchildren at the splash pad and in the pool and her grandson dunked her head.  She denies any drainage from the ear, fever, cough, congestion, sore throat.  Reports she got some "blood" out of the right ear this morning when she was using Q-tips.  She endorses hearing loss out of the right ear and use of Q-tips.    Past Medical History:  Diagnosis Date   CAP (community acquired pneumonia)    Streptococcus 01/2011   COPD (chronic obstructive pulmonary disease) (HCC)    Coronary atherosclerosis of native coronary artery    a. Diagnosed [place] 2006 - DES RCA, reports followup cath 2009 at [facility] b. reported 2 stents in [place] in 2020. c. 07/2021: cath showing patent stents along RCA and mid-LAD with scattered 20% stenosis but no obstructive disease.   Dyslipidemia    Essential hypertension, benign    Morbid obesity ([place])    Pancreatitis    Type 2 diabetes mellitus ([place])     Patient Active Problem List   Diagnosis Date Noted   CAD in native artery 08/14/2021   Chest pain of uncertain etiology    Cellulitis of leg, right 06/16/2021   Hx of BKA, left ([place]) 09/09/2020   Vitamin D deficiency 07/30/2020   Chronic respiratory failure with hypoxia ([place]) 07/30/2020   Anxiety and depression 07/30/2020   Atrial fibrillation ([place]) 07/30/2020   Acute osteomyelitis of toe, left ([place])    Sepsis due to Lt foot osteo 01/23/2020   Morbid obesity with body mass index (BMI) of 50.0 to 59.9 in adult ([place]) 01/23/2020   Tobacco abuse--2 P/day Smoker 01/23/2020   Cellulitis of left foot 01/22/2020   Osteomyelitis of left foot ([place]) 01/22/2020   Leukocytosis 01/22/2020    Hypokalemia 01/22/2020   Hypoalbuminemia 01/22/2020   Uncontrolled type 2 diabetes mellitus with hyperglycemia, with long-term current use of insulin ([place]) 01/22/2020   Elevated alpha fetoprotein 01/22/2020   SIRS (systemic inflammatory response syndrome) ([place]) 01/22/2020   Diabetic ulcer of left foot ([place]) 12/05/2019   Cough 05/26/2012   TIA (transient ischemic attack) 59/74/1638   Acute diastolic congestive heart failure ([place]) 11/12/2011   COPD (chronic obstructive pulmonary disease) ([place]) 11/09/2011   Dyspnea 06/10/2011   CAP (community acquired pneumonia) 02/19/2011   Coronary artery disease involving native coronary artery of native heart with angina pectoris ([place]) 02/19/2011   Dyslipidemia    Morbid obesity ([place])    Diabetes mellitus type 2 in obese ([place])    HTN (hypertension), benign     Past Surgical History:  Procedure Laterality Date   ABDOMINAL HERNIA REPAIR     ABDOMINAL HYSTERECTOMY     AMPUTATION Left 01/25/2020   Procedure: LEFT BELOW KNEE AMPUTATION;  Surgeon: Newt Minion, MD;  Location: [place];  Service: Orthopedics;  Laterality: Left;   CORONARY ANGIOPLASTY WITH STENT PLACEMENT  2006   KNEE SURGERY     LEFT HEART CATH AND CORONARY ANGIOGRAPHY N/A 08/14/2021   Procedure: LEFT HEART CATH AND CORONARY ANGIOGRAPHY;  Surgeon: Troy Sine, MD;  Location: [facility];  Service: Cardiovascular;  Laterality: N/A;  NOSE SURGERY     Pilonidal cystectomy     TONSILLECTOMY      OB History     Gravida      Para      Term      Preterm      AB      Living  4      SAB      IAB      Ectopic      Multiple      Live Births               Home Medications    Prior to Admission medications   Medication Sig Start Date End Date Taking? Authorizing Provider  carbamide peroxide (DEBROX) 6.5 % OTIC solution Place 5 drops into the right ear 2 (two) times daily. 09/22/21  Yes Eulogio Bear, NP  ofloxacin (FLOXIN) 0.3 % OTIC solution Place 10  drops into the left ear daily for 7 days. 09/22/21 09/29/21 Yes Eulogio Bear, NP  ACCU-CHEK GUIDE test strip USE 1 STRIP TO CHECK GLUCOSE 4 TIMES DAILY 03/11/21   Brita Romp, NP  acetaminophen (TYLENOL) 500 MG tablet Take 1 tablet (500 mg total) by mouth every 6 (six) hours as needed for moderate pain. 02/05/20   Amin, Jeanella Flattery, MD  albuterol (PROVENTIL) (2.5 MG/3ML) 0.083% nebulizer solution Take 3 mLs (2.5 mg total) by nebulization every 6 (six) hours as needed for wheezing. 07/30/20   Ailene Ards, NP  albuterol (VENTOLIN HFA) 108 (90 Base) MCG/ACT inhaler Inhale 2 puffs into the lungs every 6 (six) hours as needed for wheezing. Shortness of breath 07/30/20   Ailene Ards, NP  blood glucose meter kit and supplies Dispense based on patient and insurance preference. Use four times daily as directed. (FOR ICD-10 E10.9, E11.9). 01/09/21   Brita Romp, NP  buPROPion ER (WELLBUTRIN SR) 100 MG 12 hr tablet Take 1 tablet (100 mg total) by mouth 2 (two) times daily. 06/20/21   Dwyane Dee, MD  celecoxib (CELEBREX) 200 MG capsule Take 200 mg by mouth 2 (two) times daily. 03/23/21   [provider]  clopidogrel (PLAVIX) 75 MG tablet Take 1 tablet (75 mg total) by mouth daily. 09/09/21   Strader, Fransisco Hertz, PA-C  docusate sodium (COLACE) 100 MG capsule Take 1 capsule (100 mg total) by mouth 2 (two) times daily as needed for mild constipation. 07/30/20   Ailene Ards, NP  EASY [facility] INSULIN SYRINGE 31G X 5/16" 1 ML MISC  09/21/19   [provider]  EPINEPHrine 0.3 mg/0.3 mL IJ SOAJ injection Inject 0.3 mg into the muscle as needed for anaphylaxis. 05/15/20   Ailene Ards, NP  furosemide (LASIX) 40 MG tablet Take 1.5 tablets (60 mg total) by mouth daily. 08/05/21   Strader, Fransisco Hertz, PA-C  glipiZIDE (GLUCOTROL) 10 MG tablet Take 1 tablet (10 mg total) by mouth in the morning and at bedtime. 06/20/21   Dwyane Dee, MD  Insulin Pen Needle (B-D ULTRAFINE III SHORT PEN) 31G X 8  MM MISC USE AS DIRECTED 07/06/21   [name], NP  insulin regular human CONCENTRATED (HUMULIN R U-500 KWIKPEN) 500 UNIT/ML KwikPen Inject 80 Units into the skin 3 (three) times daily with meals. 01/23/21   Brita Romp, NP  lisinopril (ZESTRIL) 10 MG tablet Take 10 mg by mouth daily. 07/10/21   [provider]  loperamide (IMODIUM) 2 MG capsule Take 1 capsule (2 mg total) by  mouth every 6 (six) hours as needed for diarrhea or loose stools. 12/10/19   Kathie Dike, MD  metoprolol tartrate (LOPRESSOR) 50 MG tablet Take 1 tablet (50 mg total) by mouth 2 (two) times daily. 07/30/20   Ailene Ards, NP  Multiple Vitamins-Calcium (ONE-A-DAY WOMENS FORMULA) TABS Take 1 tablet by mouth daily.    [provider]  nitroGLYCERIN (NITROSTAT) 0.4 MG SL tablet Take one tablet every 5 minutes as needed for chest pains up to 3 tablets. Patient taking differently: Place 0.4 mg under the tongue every 5 (five) minutes as needed for chest pain (every 5 minutes as needed for chest pains up to 3 tablets.). 10/03/20   Fay Records, MD  Nystatin (GERHARDT'S BUTT CREAM) CREA Apply 1 Application topically 2 (two) times daily. 08/15/21   Bhagat, Crista Luria, PA  nystatin powder Apply 1 Application topically daily as needed (irritation).    [provider]  pantoprazole (PROTONIX) 40 MG tablet Take 1 tablet (40 mg total) by mouth daily. Patient taking differently: Take 40 mg by mouth at bedtime. 07/30/20   Ailene Ards, NP  rosuvastatin (CRESTOR) 40 MG tablet Take 40 mg by mouth at bedtime. 04/16/21   [provider]  TRULICITY 1.5 PR/9.1MB SOPN Inject 1.5 mg into the skin once a week. Patient taking differently: Inject 1.5 mg into the skin every Thursday. 07/30/20   Ailene Ards, NP  Vitamin D, Ergocalciferol, (DRISDOL) 1.25 MG (50000 UNIT) CAPS capsule Take 50,000 Units by mouth every Monday. 04/16/21   [provider]    Family History Family History  Problem Relation Age  of Onset   Diabetes Mother    Heart failure Mother    Hypertension Mother    Kidney failure Mother    Ovarian cancer Mother    Asthma Son     Social History Social History   Tobacco Use   Smoking status: Former    Packs/day: 0.50    Years: 20.00    Total pack years: 10.00    Types: Cigarettes    Quit date: 01/22/2020    Years since quitting: 1.6   Smokeless tobacco: Never  Vaping Use   Vaping Use: Never used  Substance Use Topics   Alcohol use: No   Drug use: No     Allergies   Aspirin, Bee venom, Pineapple, and E-mycin [erythromycin base]   Review of Systems Review of Systems Per HPI  Physical Exam Triage Vital Signs ED Triage Vitals  Enc Vitals Group     BP 09/22/21 1546 132/66     Pulse Rate 09/22/21 1546 81     Resp 09/22/21 1546 18     Temp 09/22/21 1546 98.8 F (37.1 C)     Temp Source 09/22/21 1546 Oral     SpO2 09/22/21 1546 92 %     Weight --      Height --      Head Circumference --      Peak Flow --      Pain Score 09/22/21 1547 6     Pain Loc --      Pain Edu? --      Excl. in [place]? --    No data found.  Updated Vital Signs BP 132/66 (BP Location: Left Arm)   Pulse 81   Temp 98.8 F (37.1 C) (Oral)   Resp 18   SpO2 92%   Visual Acuity Right Eye Distance:   Left Eye Distance:   Bilateral Distance:  Right Eye Near:   Left Eye Near:    Bilateral Near:     Physical Exam Vitals and nursing note reviewed.  Constitutional:      General: She is not in acute distress.    Appearance: Normal appearance. She is not toxic-appearing.  HENT:     Head: Normocephalic and atraumatic.     Right Ear: Tenderness present. There is impacted cerumen.     Left Ear: Drainage, swelling and tenderness present. There is impacted cerumen. Tympanic membrane is not perforated, erythematous or bulging.     Ears:     Comments: Unable to visualized TM of right ear secondary to impacted cerumen    Nose: Nose normal. No congestion or rhinorrhea.      Mouth/Throat:     Mouth: Mucous membranes are moist.     Pharynx: Oropharynx is clear. No posterior oropharyngeal erythema.  Pulmonary:     Effort: Pulmonary effort is normal. No respiratory distress.  Skin:    General: Skin is warm and dry.     Coloration: Skin is not jaundiced or pale.     Findings: No erythema.  Neurological:     Mental Status: She is alert and oriented to person, place, and time.  Psychiatric:        Behavior: Behavior is cooperative.      UC Treatments / Results  Labs (all labs ordered are listed, but only abnormal results are displayed) Labs Reviewed - No data to display  EKG   Radiology No results found.  Procedures Procedures (including critical care time)  Medications Ordered in UC Medications - No data to display  Initial Impression / Assessment and Plan / UC Course  I have reviewed the triage vital signs and the nursing notes.  Pertinent labs & imaging results that were available during my care of the patient were reviewed by me and considered in my medical decision making (see chart for details).    Patient is a very pleasant, well-appearing 56 year old female presenting for bilateral ear pain, worse on the right for the past couple of days.  On examination of the right ear, I am unable to view the tympanic membrane secondary to impacted cerumen.  What I am able to visualize of the right external auditory canal is nonerythematous, nonedematous.  She does have tenderness to manipulation of the right pinna. On examination of the left ear, I am able to view about 25% of the tympanic membrane, unable to view the rest secondary to impacted cerumen.  The left external auditory canal appears erythematous, slightly edematous and swollen.  No active drainage.  Slightly tender to palpation of the left pinna.  We will treat patient for the impacted cerumen of the right ear with Debrox drops, otitis externa of the left ear with ofloxacin drops.  Encouraged  ear precautions and keeping ears protected if going your water and avoiding getting any new water in the ear.  Encourage follow-up with primary care provider if symptoms persist or worsen or return here.  The patient was given the opportunity to ask questions.  All questions answered to their satisfaction.  The patient is in agreement to this plan.   Final Clinical Impressions(s) / UC Diagnoses   Final diagnoses:  Impacted cerumen of right ear  Acute otitis externa of left ear, unspecified type     Discharge Instructions      - Use Debrox drops to right ear - Use Ofloxacin drops to left ear - Keep ears protected with  cotton ball/vaseline to prevent getting water in them  - Follow up with PCP if symptoms persist or worsen    ED Prescriptions     Medication Sig Dispense Auth. Provider   carbamide peroxide (DEBROX) 6.5 % OTIC solution Place 5 drops into the right ear 2 (two) times daily. 15 mL Noemi Chapel A, NP   ofloxacin (FLOXIN) 0.3 % OTIC solution Place 10 drops into the left ear daily for 7 days. 5 mL Eulogio Bear, NP      PDMP not reviewed this encounter.   Eulogio Bear, NP 09/22/21 1649

## 2021-09-22 NOTE — Discharge Instructions (Signed)
-   Use Debrox drops to right ear - Use Ofloxacin drops to left ear - Keep ears protected with cotton ball/vaseline to prevent getting water in them  - Follow up with PCP if symptoms persist or worsen

## 2021-09-22 NOTE — ED Triage Notes (Signed)
Feels like she has water in both ears.  States she found blood in right ear this morning.  States hearing is muffled.  States she went swimming a few days ago.

## 2021-09-23 DIAGNOSIS — J449 Chronic obstructive pulmonary disease, unspecified: Secondary | ICD-10-CM | POA: Diagnosis not present

## 2021-09-24 DIAGNOSIS — R809 Proteinuria, unspecified: Secondary | ICD-10-CM | POA: Diagnosis not present

## 2021-09-24 DIAGNOSIS — L97919 Non-pressure chronic ulcer of unspecified part of right lower leg with unspecified severity: Secondary | ICD-10-CM | POA: Diagnosis not present

## 2021-09-24 DIAGNOSIS — L209 Atopic dermatitis, unspecified: Secondary | ICD-10-CM | POA: Diagnosis not present

## 2021-09-24 DIAGNOSIS — H6123 Impacted cerumen, bilateral: Secondary | ICD-10-CM | POA: Diagnosis not present

## 2021-09-24 DIAGNOSIS — M6281 Muscle weakness (generalized): Secondary | ICD-10-CM | POA: Diagnosis not present

## 2021-09-24 DIAGNOSIS — R600 Localized edema: Secondary | ICD-10-CM | POA: Insufficient documentation

## 2021-09-24 DIAGNOSIS — H6503 Acute serous otitis media, bilateral: Secondary | ICD-10-CM | POA: Diagnosis not present

## 2021-09-25 ENCOUNTER — Other Ambulatory Visit (INDEPENDENT_AMBULATORY_CARE_PROVIDER_SITE_OTHER): Payer: Self-pay | Admitting: Nurse Practitioner

## 2021-09-25 DIAGNOSIS — E1169 Type 2 diabetes mellitus with other specified complication: Secondary | ICD-10-CM

## 2021-09-25 DIAGNOSIS — I2510 Atherosclerotic heart disease of native coronary artery without angina pectoris: Secondary | ICD-10-CM

## 2021-09-25 DIAGNOSIS — J449 Chronic obstructive pulmonary disease, unspecified: Secondary | ICD-10-CM

## 2021-09-25 DIAGNOSIS — I10 Essential (primary) hypertension: Secondary | ICD-10-CM

## 2021-09-25 DIAGNOSIS — I509 Heart failure, unspecified: Secondary | ICD-10-CM

## 2021-09-25 DIAGNOSIS — E785 Hyperlipidemia, unspecified: Secondary | ICD-10-CM

## 2021-09-29 ENCOUNTER — Encounter: Payer: Self-pay | Admitting: Nurse Practitioner

## 2021-09-29 ENCOUNTER — Ambulatory Visit (INDEPENDENT_AMBULATORY_CARE_PROVIDER_SITE_OTHER): Payer: Medicare Other | Admitting: Nurse Practitioner

## 2021-09-29 VITALS — BP 160/67 | HR 73 | Ht 63.0 in | Wt 308.0 lb

## 2021-09-29 DIAGNOSIS — E1165 Type 2 diabetes mellitus with hyperglycemia: Secondary | ICD-10-CM | POA: Diagnosis not present

## 2021-09-29 DIAGNOSIS — Z794 Long term (current) use of insulin: Secondary | ICD-10-CM | POA: Diagnosis not present

## 2021-09-29 DIAGNOSIS — E1169 Type 2 diabetes mellitus with other specified complication: Secondary | ICD-10-CM

## 2021-09-29 DIAGNOSIS — I10 Essential (primary) hypertension: Secondary | ICD-10-CM | POA: Diagnosis not present

## 2021-09-29 DIAGNOSIS — E782 Mixed hyperlipidemia: Secondary | ICD-10-CM

## 2021-09-29 DIAGNOSIS — E669 Obesity, unspecified: Secondary | ICD-10-CM

## 2021-09-29 LAB — POCT GLYCOSYLATED HEMOGLOBIN (HGB A1C): HbA1c POC (<> result, manual entry): 14.3 % (ref 4.0–5.6)

## 2021-09-29 MED ORDER — HUMULIN R U-500 KWIKPEN 500 UNIT/ML ~~LOC~~ SOPN: 80.0000 [IU] | PEN_INJECTOR | Freq: Three times a day (TID) | SUBCUTANEOUS | 3 refills | Status: DC

## 2021-09-29 MED ORDER — GLIPIZIDE 10 MG PO TABS: 10.0000 mg | ORAL_TABLET | Freq: Two times a day (BID) | ORAL | 3 refills | Status: DC

## 2021-09-29 MED ORDER — OZEMPIC (0.25 OR 0.5 MG/DOSE) 2 MG/3ML ~~LOC~~ SOPN: 0.5000 mg | PEN_INJECTOR | SUBCUTANEOUS | 0 refills | Status: DC

## 2021-09-29 MED ORDER — SEMAGLUTIDE (1 MG/DOSE) 4 MG/3ML ~~LOC~~ SOPN: 1.0000 mg | PEN_INJECTOR | SUBCUTANEOUS | 1 refills | Status: DC

## 2021-09-29 NOTE — Progress Notes (Signed)
Endocrinology Follow Up Note       09/29/2021, 11:19 AM   Subjective:    Patient ID: Nicole [name], female    DOB: 08/06/1965.  Nicole [name] is being seen in follow up after being seen in consultation for management of currently uncontrolled symptomatic diabetes requested by  Celene Squibb, MD.   Past Medical History:  Diagnosis Date   CAP (community acquired pneumonia)    Streptococcus 01/2011   COPD (chronic obstructive pulmonary disease) ([place])    Coronary atherosclerosis of native coronary artery    a. Diagnosed [place] 2006 - DES RCA, reports followup cath 2009 at [facility] b. reported 2 stents in [place] in 2020. c. 07/2021: cath showing patent stents along RCA and mid-LAD with scattered 20% stenosis but no obstructive disease.   Dyslipidemia    Essential hypertension, benign    Morbid obesity ([place])    Pancreatitis    Type 2 diabetes mellitus ([place])     Past Surgical History:  Procedure Laterality Date   ABDOMINAL HERNIA REPAIR     ABDOMINAL HYSTERECTOMY     AMPUTATION Left 01/25/2020   Procedure: LEFT BELOW KNEE AMPUTATION;  Surgeon: Newt Minion, MD;  Location: [facility];  Service: Orthopedics;  Laterality: Left;   CORONARY ANGIOPLASTY WITH STENT PLACEMENT  2006   KNEE SURGERY     LEFT HEART CATH AND CORONARY ANGIOGRAPHY N/A 08/14/2021   Procedure: LEFT HEART CATH AND CORONARY ANGIOGRAPHY;  Surgeon: Troy Sine, MD;  Location: [facility];  Service: Cardiovascular;  Laterality: N/A;   NOSE SURGERY     Pilonidal cystectomy     TONSILLECTOMY      Social History   Socioeconomic History   Marital status: Divorced    Spouse name: Not on file   Number of children: 3   Years of education: Not on file   Highest education level: Not on file  Occupational History   Occupation: Home health aide    Employer: [employer]  Tobacco Use   Smoking status: Former    Packs/day:  0.50    Years: 20.00    Total pack years: 10.00    Types: Cigarettes    Quit date: 01/22/2020    Years since quitting: 1.6   Smokeless tobacco: Never  Vaping Use   Vaping Use: Never used  Substance and Sexual Activity   Alcohol use: No   Drug use: No   Sexual activity: Yes    Birth control/protection: Surgical  Other Topics Concern   Not on file  Social History Narrative   Not on file   Social Determinants of Health   Financial Resource Strain: Not on file  Food Insecurity: Not on file  Transportation Needs: Not on file  Physical Activity: Not on file  Stress: Not on file  Social Connections: Not on file    Family History  Problem Relation Age of Onset   Diabetes Mother    Heart failure Mother    Hypertension Mother    Kidney failure Mother    Ovarian cancer Mother    Asthma Son     Outpatient Encounter Medications as of 09/29/2021  Medication Sig  Semaglutide, 1 MG/DOSE, 4 MG/3ML SOPN Inject 1 mg as directed once a week.   Semaglutide,0.25 or 0.5MG/DOS, (OZEMPIC, 0.25 OR 0.5 MG/DOSE,) 2 MG/3ML SOPN Inject 0.5 mg into the skin once a week.   ACCU-CHEK GUIDE test strip USE 1 STRIP TO CHECK GLUCOSE 4 TIMES DAILY   acetaminophen (TYLENOL) 500 MG tablet Take 1 tablet (500 mg total) by mouth every 6 (six) hours as needed for moderate pain.   albuterol (PROVENTIL) (2.5 MG/3ML) 0.083% nebulizer solution Take 3 mLs (2.5 mg total) by nebulization every 6 (six) hours as needed for wheezing.   albuterol (VENTOLIN HFA) 108 (90 Base) MCG/ACT inhaler Inhale 2 puffs into the lungs every 6 (six) hours as needed for wheezing. Shortness of breath   blood glucose meter kit and supplies Dispense based on patient and insurance preference. Use four times daily as directed. (FOR ICD-10 E10.9, E11.9).   celecoxib (CELEBREX) 200 MG capsule Take 200 mg by mouth 2 (two) times daily.   clopidogrel (PLAVIX) 75 MG tablet Take 1 tablet (75 mg total) by mouth daily.   EASY TOUCH INSULIN SYRINGE 31G  X 5/16" 1 ML MISC    EPINEPHrine 0.3 mg/0.3 mL IJ SOAJ injection Inject 0.3 mg into the muscle as needed for anaphylaxis.   furosemide (LASIX) 40 MG tablet Take 1.5 tablets (60 mg total) by mouth daily. (Patient taking differently: Take 40 mg by mouth daily.)   glipiZIDE (GLUCOTROL) 10 MG tablet Take 1 tablet (10 mg total) by mouth 2 (two) times daily before a meal.   Insulin Pen Needle (B-D ULTRAFINE III SHORT PEN) 31G X 8 MM MISC USE AS DIRECTED   insulin regular human CONCENTRATED (HUMULIN R U-500 KWIKPEN) 500 UNIT/ML KwikPen Inject 80 Units into the skin 3 (three) times daily with meals.   lisinopril (ZESTRIL) 10 MG tablet Take 10 mg by mouth 2 (two) times daily.   loperamide (IMODIUM) 2 MG capsule Take 1 capsule (2 mg total) by mouth every 6 (six) hours as needed for diarrhea or loose stools.   metoprolol tartrate (LOPRESSOR) 50 MG tablet Take 1 tablet (50 mg total) by mouth 2 (two) times daily.   Multiple Vitamins-Calcium (ONE-A-DAY WOMENS FORMULA) TABS Take 1 tablet by mouth daily.   nitroGLYCERIN (NITROSTAT) 0.4 MG SL tablet Take one tablet every 5 minutes as needed for chest pains up to 3 tablets. (Patient taking differently: Place 0.4 mg under the tongue every 5 (five) minutes as needed for chest pain (every 5 minutes as needed for chest pains up to 3 tablets.).)   Nystatin (GERHARDT'S BUTT CREAM) CREA Apply 1 Application topically 2 (two) times daily.   nystatin powder Apply 1 Application topically daily as needed (irritation).   pantoprazole (PROTONIX) 40 MG tablet Take 1 tablet (40 mg total) by mouth daily. (Patient taking differently: Take 40 mg by mouth at bedtime.)   rosuvastatin (CRESTOR) 40 MG tablet Take 40 mg by mouth at bedtime.   Vitamin D, Ergocalciferol, (DRISDOL) 1.25 MG (50000 UNIT) CAPS capsule Take 50,000 Units by mouth every Monday.   [DISCONTINUED] buPROPion ER (WELLBUTRIN SR) 100 MG 12 hr tablet Take 1 tablet (100 mg total) by mouth 2 (two) times daily.    [DISCONTINUED] carbamide peroxide (DEBROX) 6.5 % OTIC solution Place 5 drops into the right ear 2 (two) times daily.   [DISCONTINUED] docusate sodium (COLACE) 100 MG capsule Take 1 capsule (100 mg total) by mouth 2 (two) times daily as needed for mild constipation.   [DISCONTINUED] glipiZIDE (GLUCOTROL) 10  MG tablet Take 1 tablet (10 mg total) by mouth in the morning and at bedtime. (Patient taking differently: Take 10 mg by mouth 2 (two) times daily before a meal.)   [DISCONTINUED] insulin regular human CONCENTRATED (HUMULIN R U-500 KWIKPEN) 500 UNIT/ML KwikPen Inject 80 Units into the skin 3 (three) times daily with meals.   [DISCONTINUED] ofloxacin (FLOXIN) 0.3 % OTIC solution Place 10 drops into the left ear daily for 7 days.   [DISCONTINUED] TRULICITY 1.5 JM/4.2AS SOPN Inject 1.5 mg into the skin once a week. (Patient not taking: Reported on 09/29/2021)   No facility-administered encounter medications on file as of 09/29/2021.    ALLERGIES: Allergies  Allergen Reactions   Aspirin Anaphylaxis and Other (See Comments)    Throat closing    Bee Venom Anaphylaxis   Pineapple Anaphylaxis   E-Mycin [Erythromycin Base] Nausea And Vomiting    VACCINATION STATUS: Immunization History  Administered Date(s) Administered   Influenza,inj,Quad PF,6+ Mos 12/25/2019, 12/23/2020   Moderna Sars-Covid-2 Vaccination 06/25/2019, 07/25/2019   Pneumococcal Polysaccharide-23 04/11/2012, 12/25/2019    Diabetes She presents for her follow-up diabetic visit. She has type 2 diabetes mellitus. Onset time: diagnosed at approx age of 53. Her disease course has been worsening. There are no hypoglycemic associated symptoms. Associated symptoms include blurred vision, fatigue, foot paresthesias, polydipsia and polyuria. There are no hypoglycemic complications. Symptoms are stable. Diabetic complications include heart disease, peripheral neuropathy and PVD. (Hx Left BKA, MI) Risk factors for coronary artery disease  include diabetes mellitus, dyslipidemia, family history, obesity, hypertension and sedentary lifestyle. Current diabetic treatment includes intensive insulin program and oral agent (monotherapy) (and Trulicity 1.5- but has been out for several weeks). She is compliant with treatment most of the time. Her weight is fluctuating dramatically. She is following a generally unhealthy diet. When asked about meal planning, she reported none. She has not had a previous visit with a dietitian. She rarely participates in exercise. Her breakfast blood glucose range is generally >200 mg/dl. Her lunch blood glucose range is generally >200 mg/dl. Her dinner blood glucose range is generally >200 mg/dl. Her bedtime blood glucose range is generally >200 mg/dl. (She presents today with her logs but she has not been checking her glucose recently at all.  Her POCT A1c today is 14.3%, increasing from last visit.  She admits she gave up previously but after a recent hospitalization for cardiac cath, she has re-engaged in her care.  She reports she has been out of Trulicity for a few weeks.  She is also asking about pumps.) An ACE inhibitor/angiotensin II receptor blocker is being taken. She sees a podiatrist.Eye exam is current.  Hypertension This is a chronic problem. The current episode started more than 1 year ago. The problem has been gradually improving since onset. The problem is controlled. Associated symptoms include blurred vision. There are no associated agents to hypertension. Risk factors for coronary artery disease include diabetes mellitus, dyslipidemia, family history, obesity and sedentary lifestyle. Past treatments include ACE inhibitors, beta blockers and diuretics. The current treatment provides mild improvement. Compliance problems include diet and exercise.  Hypertensive end-organ damage includes CAD/MI, heart failure and PVD.  Hyperlipidemia This is a chronic problem. The current episode started more than 1 year  ago. The problem is uncontrolled. Recent lipid tests were reviewed and are variable. Exacerbating diseases include diabetes and obesity. Factors aggravating her hyperlipidemia include beta blockers. Current antihyperlipidemic treatment includes statins. The current treatment provides mild improvement of lipids. Compliance problems include adherence to  diet and adherence to exercise.  Risk factors for coronary artery disease include diabetes mellitus, dyslipidemia, family history, obesity, hypertension and a sedentary lifestyle.     Review of systems  Constitutional: + Minimally fluctuating body weight, current Body mass index is 54.56 kg/m., + fatigue, no subjective hyperthermia, no subjective hypothermia Eyes: + blurry vision, no xerophthalmia ENT: no sore throat, no nodules palpated in throat, no dysphagia/odynophagia, no hoarseness Cardiovascular: no chest pain, no shortness of breath, no palpitations, + leg swelling Respiratory: no cough, + shortness of breath- hx COPD on chronic O2 Gastrointestinal: no nausea/vomiting/diarrhea Musculoskeletal: no muscle/joint aches, WC bound due to hx of L BKA Skin: no rashes, no hyperemia, blistered area to right shin-using Rx cream Neurological: no tremors, no numbness, no tingling, no dizziness Psychiatric: no depression, no anxiety  Objective:     BP (!) 160/67   Pulse 73   Ht 5' 3"  (1.6 m)   Wt (!) 308 lb (139.7 kg)   BMI 54.56 kg/m   Wt Readings from Last 3 Encounters:  09/29/21 (!) 308 lb (139.7 kg)  09/09/21 295 lb (133.8 kg)  08/14/21 291 lb (132 kg)     BP Readings from Last 3 Encounters:  09/29/21 (!) 160/67  09/22/21 132/66  09/09/21 128/62     Physical Exam- Limited  Constitutional:  Body mass index is 54.56 kg/m. , not in acute distress, normal state of mind Eyes:  EOMI, no exophthalmos Neck: Supple Cardiovascular: RRR, no murmurs, rubs, or gallops,  Respiratory: Adequate breathing efforts, no crackles, rales, rhonchi,  or wheezing, chronic O2 Musculoskeletal: WC bound- hx L BKA Skin:  no rashes, no hyperemia Neurological: no tremor with outstretched hands   Diabetic Foot Exam - Simple   Simple Foot Form Diabetic Foot exam was performed with the following findings: Yes 09/29/2021 11:11 AM  Visual Inspection See comments: Yes Sensation Testing See comments: Yes Pulse Check See comments: Yes Comments L BKA;  Right foot mild onychomycosis with no sensation to monofilament tool. Posterior tibialis and dorsalis pulse intact    CMP ( most recent) CMP     Component Value Date/Time   NA 137 08/19/2021 1116   K 3.8 08/19/2021 1116   CL 100 08/19/2021 1116   CO2 29 08/19/2021 1116   GLUCOSE 203 (H) 08/19/2021 1116   BUN 20 08/19/2021 1116   CREATININE 1.04 (H) 08/19/2021 1116   CREATININE 0.86 07/30/2020 1558   CALCIUM 9.3 08/19/2021 1116   PROT 7.0 06/16/2021 1145   ALBUMIN 3.2 (L) 06/16/2021 1145   AST 24 06/16/2021 1145   ALT 14 06/16/2021 1145   ALKPHOS 89 06/16/2021 1145   BILITOT 0.4 06/16/2021 1145   GFRNONAA >60 08/19/2021 1116   GFRNONAA 76 07/30/2020 1558   GFRAA 88 07/30/2020 1558     Diabetic Labs (most recent): Lab Results  Component Value Date   HGBA1C 14.3 09/29/2021   HGBA1C 13.1 (H) 06/17/2021   HGBA1C 12.4 (A) 12/23/2020     Lipid Panel ( most recent) Lipid Panel     Component Value Date/Time   CHOL 100 08/15/2021 0123   TRIG 217 (H) 08/15/2021 0123   HDL 26 (L) 08/15/2021 0123   CHOLHDL 3.8 08/15/2021 0123   VLDL 43 (H) 08/15/2021 0123   LDLCALC 31 08/15/2021 0123   LDLCALC 65 12/25/2019 1359      Lab Results  Component Value Date   TSH 2.06 12/25/2019   TSH 2.800 04/11/2012   TSH 4.814 (H) 11/10/2011   TSH  2.672 02/19/2011   FREET4 1.2 12/25/2019           Assessment & Plan:   1) Type 2 diabetes mellitus with hyperglycemia, with long-term current use of insulin ([place])  She presents today with her logs but she has not been checking her glucose  recently at all.  Her POCT A1c today is 14.3%, increasing from last visit.  She admits she gave up previously but after a recent hospitalization for cardiac cath, she has re-engaged in her care.  She reports she has been out of Trulicity for a few weeks.  She is also asking about pumps.  - CASSIOPEIA FLORENTINO has currently uncontrolled symptomatic type 2 DM since 56 years of age.   -Recent labs reviewed.  - I had a long discussion with her about the progressive nature of diabetes and the pathology behind its complications. -her diabetes is complicated by PVD- L BKA, MI, CAD, CHF and she remains at a high risk for more acute and chronic complications which include CAD, CVA, CKD, retinopathy, and neuropathy. These are all discussed in detail with her.  - Nutritional counseling repeated at each appointment due to patients tendency to fall back in to old habits.  - The patient admits there is a room for improvement in their diet and drink choices. -  Suggestion is made for the patient to avoid simple carbohydrates from their diet including Cakes, Sweet Desserts / Pastries, Ice Cream, Soda (diet and regular), Sweet Tea, Candies, Chips, Cookies, Sweet Pastries, Store Bought Juices, Alcohol in Excess of 1-2 drinks a day, Artificial Sweeteners, Coffee Creamer, and "Sugar-free" Products. This will help patient to have stable blood glucose profile and potentially avoid unintended weight gain.   - I encouraged the patient to switch to unprocessed or minimally processed complex starch and increased protein intake (animal or plant source), fruits, and vegetables.   - Patient is advised to stick to a routine mealtimes to eat 3 meals a day and avoid unnecessary snacks (to snack only to correct hypoglycemia).  - she will be scheduled with Jearld Fenton, RDN, CDE for diabetes education.  - I have approached her with the following individualized plan to manage her diabetes and patient agrees:   -She disappeared  from care for approximately 8 months.  -Given the lack of logs to review, no changes will be made to her insulin doses today.  She is advised to continue U500 80 units TID with meals if glucose is above 90 and she is eating.  She can also continue her Glipizide 10 mg po twice daily with meals.  Will change her to Ozempic 0.5 mg SQ weekly to aide further in weight loss attempts, also because she has had trouble getting the Trulicity due to supply issues.  She will increase her Ozempic to 1 mg SQ weekly after she has finished her 1 month of the 0.5 mg dose and we will continue to titrate up until reaching desired results.  -she is encouraged to continue monitoring glucose 4 times daily, before meals and before bed, and to call the clinic if she has readings less than 70 or above 300 for 3 tests in a row.  She has a CGM but has not been using it.  I encouraged her to start back using it to help manage her glucose.  - she is warned not to take insulin without proper monitoring per orders. - Adjustment parameters are given to her for hypo and hyperglycemia in writing.  -  Specific targets for  A1c; LDL, HDL, and Triglycerides were discussed with the patient.  2) Blood Pressure /Hypertension:  her blood pressure is controlled to target.   she is advised to continue her current medications including Lasix 80 mg po daily, Lisinopril 2.5 mg p.o. daily with breakfast, and Metoprolol 50 mg po twice daily.  3) Lipids/Hyperlipidemia:    Review of her recent lipid panel from 08/15/21 showed controlled LDL at 31 and elevated triglycerides of 217 .  she is advised to continue Simvastatin 80 mg daily at bedtime.  Side effects and precautions discussed with her.  She is also advised to avoid fried foods and butter.  4)  Weight/Diet:  her Body mass index is 54.56 kg/m.  -  clearly complicating her diabetes care.   she is a candidate for weight loss. I discussed with her the fact that loss of 5 - 10% of her  current  body weight will have the most impact on her diabetes management.  Exercise, and detailed carbohydrates information provided  -  detailed on discharge instructions.  5) Chronic Care/Health Maintenance: -she is on ACEI/ARB and Statin medications and is encouraged to initiate and continue to follow up with Ophthalmology, Dentist, Podiatrist at least yearly or according to recommendations, and advised to stay away from smoking. I have recommended yearly flu vaccine and pneumonia vaccine at least every 5 years; moderate intensity exercise for up to 150 minutes weekly; and sleep for at least 7 hours a day.  - she is advised to maintain close follow up with Celene Squibb, MD for primary care needs, as well as her other providers for optimal and coordinated care.      I spent 44 minutes in the care of the patient today including review of labs from [place], Lipids, Thyroid Function, Hematology (current and previous including abstractions from other facilities); face-to-face time discussing  her blood glucose readings/logs, discussing hypoglycemia and hyperglycemia episodes and symptoms, medications doses, her options of short and long term treatment based on the latest standards of care / guidelines;  discussion about incorporating lifestyle medicine;  and documenting the encounter. Risk reduction counseling performed per USPSTF guidelines to reduce obesity and cardiovascular risk factors.     Please refer to Patient Instructions for Blood Glucose Monitoring and Insulin/Medications Dosing Guide"  in media tab for additional information. Please  also refer to " Patient Self Inventory" in the Media  tab for reviewed elements of pertinent patient history.  Vilinda Blanks Gregory participated in the discussions, expressed understanding, and voiced agreement with the above plans.  All questions were answered to her satisfaction. she is encouraged to contact clinic should she have any questions or concerns prior to her return  visit.     Follow up plan: - Return in about 1 month (around 10/30/2021) for Diabetes F/U, Bring meter and logs.   Rayetta Pigg, [facility] [address] [place], [place] [address] Phone: [phone] Fax: [phone]  09/29/2021, 11:19 AM

## 2021-09-30 DIAGNOSIS — M6281 Muscle weakness (generalized): Secondary | ICD-10-CM | POA: Diagnosis not present

## 2021-09-30 DIAGNOSIS — G4733 Obstructive sleep apnea (adult) (pediatric): Secondary | ICD-10-CM | POA: Diagnosis not present

## 2021-10-18 ENCOUNTER — Other Ambulatory Visit: Payer: Self-pay | Admitting: Nurse Practitioner

## 2021-10-19 DIAGNOSIS — J449 Chronic obstructive pulmonary disease, unspecified: Secondary | ICD-10-CM | POA: Diagnosis not present

## 2021-10-24 DIAGNOSIS — J449 Chronic obstructive pulmonary disease, unspecified: Secondary | ICD-10-CM | POA: Diagnosis not present

## 2021-10-25 DIAGNOSIS — M6281 Muscle weakness (generalized): Secondary | ICD-10-CM | POA: Diagnosis not present

## 2021-10-27 DIAGNOSIS — R809 Proteinuria, unspecified: Secondary | ICD-10-CM | POA: Diagnosis not present

## 2021-10-31 DIAGNOSIS — M6281 Muscle weakness (generalized): Secondary | ICD-10-CM | POA: Diagnosis not present

## 2021-11-03 ENCOUNTER — Ambulatory Visit (INDEPENDENT_AMBULATORY_CARE_PROVIDER_SITE_OTHER): Payer: Medicare Other | Admitting: Nurse Practitioner

## 2021-11-03 ENCOUNTER — Encounter: Payer: Self-pay | Admitting: Nurse Practitioner

## 2021-11-03 VITALS — BP 136/69 | HR 65 | Ht 64.0 in | Wt 295.0 lb

## 2021-11-03 DIAGNOSIS — E1165 Type 2 diabetes mellitus with hyperglycemia: Secondary | ICD-10-CM

## 2021-11-03 DIAGNOSIS — I10 Essential (primary) hypertension: Secondary | ICD-10-CM

## 2021-11-03 DIAGNOSIS — Z794 Long term (current) use of insulin: Secondary | ICD-10-CM

## 2021-11-03 DIAGNOSIS — E782 Mixed hyperlipidemia: Secondary | ICD-10-CM

## 2021-11-03 MED ORDER — SEMAGLUTIDE (1 MG/DOSE) 4 MG/3ML ~~LOC~~ SOPN: 1.0000 mg | PEN_INJECTOR | SUBCUTANEOUS | 1 refills | Status: DC

## 2021-11-03 NOTE — Progress Notes (Signed)
Endocrinology Follow Up Note       11/03/2021, 11:59 AM   Subjective:    Patient ID: Nicole [name], female    DOB: 08/28/1965.  Nicole [name] is being seen in follow up after being seen in consultation for management of currently uncontrolled symptomatic diabetes requested by  Celene Squibb, MD.   Past Medical History:  Diagnosis Date   CAP (community acquired pneumonia)    Streptococcus 01/2011   COPD (chronic obstructive pulmonary disease) ([place])    Coronary atherosclerosis of native coronary artery    a. Diagnosed [place] 2006 - DES RCA, reports followup cath 2009 at [facility] b. reported 2 stents in [place] in 2020. c. 07/2021: cath showing patent stents along RCA and mid-LAD with scattered 20% stenosis but no obstructive disease.   Dyslipidemia    Essential hypertension, benign    Morbid obesity ([place])    Pancreatitis    Type 2 diabetes mellitus ([place])     Past Surgical History:  Procedure Laterality Date   ABDOMINAL HERNIA REPAIR     ABDOMINAL HYSTERECTOMY     AMPUTATION Left 01/25/2020   Procedure: LEFT BELOW KNEE AMPUTATION;  Surgeon: Newt Minion, MD;  Location: [facility];  Service: Orthopedics;  Laterality: Left;   CORONARY ANGIOPLASTY WITH STENT PLACEMENT  2006   KNEE SURGERY     LEFT HEART CATH AND CORONARY ANGIOGRAPHY N/A 08/14/2021   Procedure: LEFT HEART CATH AND CORONARY ANGIOGRAPHY;  Surgeon: Troy Sine, MD;  Location: [facility];  Service: Cardiovascular;  Laterality: N/A;   NOSE SURGERY     Pilonidal cystectomy     TONSILLECTOMY      Social History   Socioeconomic History   Marital status: Divorced    Spouse name: Not on file   Number of children: 3   Years of education: Not on file   Highest education level: Not on file  Occupational History   Occupation: Home health aide    Employer: [employer]  Tobacco Use   Smoking status: Former    Packs/day:  0.50    Years: 20.00    Total pack years: 10.00    Types: Cigarettes    Quit date: 01/22/2020    Years since quitting: 1.7   Smokeless tobacco: Never  Vaping Use   Vaping Use: Never used  Substance and Sexual Activity   Alcohol use: No   Drug use: No   Sexual activity: Yes    Birth control/protection: Surgical  Other Topics Concern   Not on file  Social History Narrative   Not on file   Social Determinants of Health   Financial Resource Strain: Not on file  Food Insecurity: Not on file  Transportation Needs: Not on file  Physical Activity: Not on file  Stress: Not on file  Social Connections: Not on file    Family History  Problem Relation Age of Onset   Diabetes Mother    Heart failure Mother    Hypertension Mother    Kidney failure Mother    Ovarian cancer Mother    Asthma Son     Outpatient Encounter Medications as of 11/03/2021  Medication Sig  ACCU-CHEK GUIDE test strip USE 1 STRIP TO CHECK GLUCOSE 4 TIMES DAILY   acetaminophen (TYLENOL) 500 MG tablet Take 1 tablet (500 mg total) by mouth every 6 (six) hours as needed for moderate pain.   albuterol (PROVENTIL) (2.5 MG/3ML) 0.083% nebulizer solution Take 3 mLs (2.5 mg total) by nebulization every 6 (six) hours as needed for wheezing.   albuterol (VENTOLIN HFA) 108 (90 Base) MCG/ACT inhaler Inhale 2 puffs into the lungs every 6 (six) hours as needed for wheezing. Shortness of breath   blood glucose meter kit and supplies Dispense based on patient and insurance preference. Use four times daily as directed. (FOR ICD-10 E10.9, E11.9).   celecoxib (CELEBREX) 200 MG capsule Take 200 mg by mouth 2 (two) times daily.   clopidogrel (PLAVIX) 75 MG tablet Take 1 tablet (75 mg total) by mouth daily.   EASY TOUCH INSULIN SYRINGE 31G X 5/16" 1 ML MISC    EPINEPHrine 0.3 mg/0.3 mL IJ SOAJ injection Inject 0.3 mg into the muscle as needed for anaphylaxis.   furosemide (LASIX) 40 MG tablet Take 1.5 tablets (60 mg total) by mouth  daily. (Patient taking differently: Take 40 mg by mouth daily.)   glipiZIDE (GLUCOTROL) 10 MG tablet Take 1 tablet (10 mg total) by mouth 2 (two) times daily before a meal.   Insulin Pen Needle (B-D ULTRAFINE III SHORT PEN) 31G X 8 MM MISC USE AS DIRECTED   insulin regular human CONCENTRATED (HUMULIN R U-500 KWIKPEN) 500 UNIT/ML KwikPen Inject 80 Units into the skin 3 (three) times daily with meals.   lisinopril (ZESTRIL) 10 MG tablet Take 10 mg by mouth 2 (two) times daily.   loperamide (IMODIUM) 2 MG capsule Take 1 capsule (2 mg total) by mouth every 6 (six) hours as needed for diarrhea or loose stools.   metoprolol tartrate (LOPRESSOR) 50 MG tablet Take 1 tablet (50 mg total) by mouth 2 (two) times daily.   Multiple Vitamins-Calcium (ONE-A-DAY WOMENS FORMULA) TABS Take 1 tablet by mouth daily.   nitroGLYCERIN (NITROSTAT) 0.4 MG SL tablet Take one tablet every 5 minutes as needed for chest pains up to 3 tablets. (Patient taking differently: Place 0.4 mg under the tongue every 5 (five) minutes as needed for chest pain (every 5 minutes as needed for chest pains up to 3 tablets.).)   Nystatin (GERHARDT'S BUTT CREAM) CREA Apply 1 Application topically 2 (two) times daily.   nystatin powder Apply 1 Application topically daily as needed (irritation).   pantoprazole (PROTONIX) 40 MG tablet Take 1 tablet (40 mg total) by mouth daily. (Patient taking differently: Take 40 mg by mouth at bedtime.)   rosuvastatin (CRESTOR) 40 MG tablet Take 40 mg by mouth at bedtime.   Vitamin D, Ergocalciferol, (DRISDOL) 1.25 MG (50000 UNIT) CAPS capsule Take 50,000 Units by mouth every Monday.   [DISCONTINUED] OZEMPIC, 0.25 OR 0.5 MG/DOSE, 2 MG/3ML SOPN INJECT 0.5 MG INTO THE SKIN ONCE A WEEK   [DISCONTINUED] Semaglutide, 1 MG/DOSE, 4 MG/3ML SOPN Inject 1 mg as directed once a week.   Semaglutide, 1 MG/DOSE, 4 MG/3ML SOPN Inject 1 mg as directed once a week.   No facility-administered encounter medications on file as of  11/03/2021.    ALLERGIES: Allergies  Allergen Reactions   Aspirin Anaphylaxis and Other (See Comments)    Throat closing    Bee Venom Anaphylaxis   Pineapple Anaphylaxis   E-Mycin [Erythromycin Base] Nausea And Vomiting    VACCINATION STATUS: Immunization History  Administered Date(s) Administered  Influenza,inj,Quad PF,6+ Mos 12/25/2019, 12/23/2020   Moderna Sars-Covid-2 Vaccination 06/25/2019, 07/25/2019   Pneumococcal Polysaccharide-23 04/11/2012, 12/25/2019    Diabetes She presents for her follow-up diabetic visit. She has type 2 diabetes mellitus. Onset time: diagnosed at approx age of 61. Her disease course has been improving. There are no hypoglycemic associated symptoms. Associated symptoms include blurred vision, fatigue, foot paresthesias, polydipsia and polyuria. There are no hypoglycemic complications. Symptoms are improving. Diabetic complications include heart disease, peripheral neuropathy and PVD. (Hx Left BKA, MI) Risk factors for coronary artery disease include diabetes mellitus, dyslipidemia, family history, obesity, hypertension and sedentary lifestyle. Current diabetic treatment includes intensive insulin program and oral agent (monotherapy) (and Ozempic). She is compliant with treatment most of the time. Her weight is fluctuating dramatically. She is following a generally unhealthy diet. When asked about meal planning, she reported none. She has not had a previous visit with a dietitian. She rarely participates in exercise. Her home blood glucose trend is fluctuating minimally. Her breakfast blood glucose range is generally >200 mg/dl. Her lunch blood glucose range is generally >200 mg/dl. Her dinner blood glucose range is generally >200 mg/dl. Her bedtime blood glucose range is generally >200 mg/dl. (She presents today with her logs, no meter, showing improved, yet still above target glycemic profile overall.  She was not due for another A1c today.  She has only been  taking 0.5 mg of the Ozempic as the pharmacy never filled the 1 mg dose.) An ACE inhibitor/angiotensin II receptor blocker is being taken. She sees a podiatrist.Eye exam is current.  Hypertension This is a chronic problem. The current episode started more than 1 year ago. The problem has been gradually improving since onset. The problem is controlled. Associated symptoms include blurred vision. There are no associated agents to hypertension. Risk factors for coronary artery disease include diabetes mellitus, dyslipidemia, family history, obesity and sedentary lifestyle. Past treatments include ACE inhibitors, beta blockers and diuretics. The current treatment provides mild improvement. Compliance problems include diet and exercise.  Hypertensive end-organ damage includes CAD/MI, heart failure and PVD.  Hyperlipidemia This is a chronic problem. The current episode started more than 1 year ago. The problem is uncontrolled. Recent lipid tests were reviewed and are variable. Exacerbating diseases include diabetes and obesity. Factors aggravating her hyperlipidemia include beta blockers. Current antihyperlipidemic treatment includes statins. The current treatment provides mild improvement of lipids. Compliance problems include adherence to diet and adherence to exercise.  Risk factors for coronary artery disease include diabetes mellitus, dyslipidemia, family history, obesity, hypertension and a sedentary lifestyle.     Review of systems  Constitutional: + Minimally fluctuating body weight, current Body mass index is 50.64 kg/m., + fatigue, no subjective hyperthermia, no subjective hypothermia Eyes: + blurry vision, no xerophthalmia ENT: no sore throat, no nodules palpated in throat, no dysphagia/odynophagia, no hoarseness Cardiovascular: no chest pain, no shortness of breath, no palpitations, + leg swelling Respiratory: no cough, + shortness of breath- hx COPD on chronic O2 Gastrointestinal: no  nausea/vomiting/diarrhea Musculoskeletal: no muscle/joint aches, WC bound due to hx of L BKA- has prosthesis Skin: no rashes, no hyperemia, Neurological: no tremors, no numbness, no tingling, no dizziness Psychiatric: no depression, no anxiety  Objective:     BP 136/69 (BP Location: Left Arm, Patient Position: Sitting, Cuff Size: Large)   Pulse 65   Ht 5' 4"  (1.626 m)   Wt 295 lb (133.8 kg) Comment: per patient  BMI 50.64 kg/m   Wt Readings from Last 3 Encounters:  11/03/21 295 lb (133.8 kg)  09/29/21 (!) 308 lb (139.7 kg)  09/09/21 295 lb (133.8 kg)     BP Readings from Last 3 Encounters:  11/03/21 136/69  09/29/21 (!) 160/67  09/22/21 132/66     Physical Exam- Limited  Constitutional:  Body mass index is 50.64 kg/m. , not in acute distress, normal state of mind Eyes:  EOMI, no exophthalmos Neck: Supple Cardiovascular: RRR, no murmurs, rubs, or gallops,  Respiratory: Adequate breathing efforts, no crackles, rales, rhonchi, or wheezing, chronic O2 Musculoskeletal: WC bound- hx L BKA with prosthesis Skin:  no rashes, no hyperemia Neurological: no tremor with outstretched hands   Diabetic Foot Exam - Simple   No data filed    CMP ( most recent) CMP     Component Value Date/Time   NA 137 08/19/2021 1116   K 3.8 08/19/2021 1116   CL 100 08/19/2021 1116   CO2 29 08/19/2021 1116   GLUCOSE 203 (H) 08/19/2021 1116   BUN 20 08/19/2021 1116   CREATININE 1.04 (H) 08/19/2021 1116   CREATININE 0.86 07/30/2020 1558   CALCIUM 9.3 08/19/2021 1116   PROT 7.0 06/16/2021 1145   ALBUMIN 3.2 (L) 06/16/2021 1145   AST 24 06/16/2021 1145   ALT 14 06/16/2021 1145   ALKPHOS 89 06/16/2021 1145   BILITOT 0.4 06/16/2021 1145   GFRNONAA >60 08/19/2021 1116   GFRNONAA 76 07/30/2020 1558   GFRAA 88 07/30/2020 1558     Diabetic Labs (most recent): Lab Results  Component Value Date   HGBA1C 14.3 09/29/2021   HGBA1C 13.1 (H) 06/17/2021   HGBA1C 12.4 (A) 12/23/2020     Lipid  Panel ( most recent) Lipid Panel     Component Value Date/Time   CHOL 100 08/15/2021 0123   TRIG 217 (H) 08/15/2021 0123   HDL 26 (L) 08/15/2021 0123   CHOLHDL 3.8 08/15/2021 0123   VLDL 43 (H) 08/15/2021 0123   LDLCALC 31 08/15/2021 0123   LDLCALC 65 12/25/2019 1359      Lab Results  Component Value Date   TSH 2.06 12/25/2019   TSH 2.800 04/11/2012   TSH 4.814 (H) 11/10/2011   TSH 2.672 02/19/2011   FREET4 1.2 12/25/2019           Assessment & Plan:   1) Type 2 diabetes mellitus with hyperglycemia, with long-term current use of insulin ([place])  She presents today with her logs, no meter, showing improved, yet still above target glycemic profile overall.  She was not due for another A1c today.  She has only been taking 0.5 mg of the Ozempic as the pharmacy never filled the 1 mg dose.  - Nicole [name] has currently uncontrolled symptomatic type 2 DM since 56 years of age.   -Recent labs reviewed.  - I had a long discussion with her about the progressive nature of diabetes and the pathology behind its complications. -her diabetes is complicated by PVD- L BKA, MI, CAD, CHF and she remains at a high risk for more acute and chronic complications which include CAD, CVA, CKD, retinopathy, and neuropathy. These are all discussed in detail with her.  - Nutritional counseling repeated at each appointment due to patients tendency to fall back in to old habits.  - The patient admits there is a room for improvement in their diet and drink choices. -  Suggestion is made for the patient to avoid simple carbohydrates from their diet including Cakes, Sweet Desserts / Pastries, Ice Cream, Soda (diet  and regular), Sweet Tea, Candies, Chips, Cookies, Sweet Pastries, Store Bought Juices, Alcohol in Excess of 1-2 drinks a day, Artificial Sweeteners, Coffee Creamer, and "Sugar-free" Products. This will help patient to have stable blood glucose profile and potentially avoid unintended weight  gain.   - I encouraged the patient to switch to unprocessed or minimally processed complex starch and increased protein intake (animal or plant source), fruits, and vegetables.   - Patient is advised to stick to a routine mealtimes to eat 3 meals a day and avoid unnecessary snacks (to snack only to correct hypoglycemia).  - she will be scheduled with Jearld Fenton, RDN, CDE for diabetes education.  - I have approached her with the following individualized plan to manage her diabetes and patient agrees:   -She is advised to continue U500 80 units TID with meals if glucose is above 90 and she is eating, continue Glipizide 10 mg po twice daily, and increase her Ozempic to 1 mg SQ weekly.   -she is encouraged to continue monitoring glucose 4 times daily, before meals and before bed, and to call the clinic if she has readings less than 70 or above 300 for 3 tests in a row.  She never received CGM device previously, I sent in order for Dexcom G7 through [employer].  - she is warned not to take insulin without proper monitoring per orders. - Adjustment parameters are given to her for hypo and hyperglycemia in writing.  - Specific targets for  A1c; LDL, HDL, and Triglycerides were discussed with the patient.  2) Blood Pressure /Hypertension:  her blood pressure is controlled to target.   she is advised to continue her current medications including Lasix 80 mg po daily, Lisinopril 2.5 mg p.o. daily with breakfast, and Metoprolol 50 mg po twice daily.  3) Lipids/Hyperlipidemia:    Review of her recent lipid panel from 08/15/21 showed controlled LDL at 31 and elevated triglycerides of 217 .  she is advised to continue Simvastatin 80 mg daily at bedtime.  Side effects and precautions discussed with her.  She is also advised to avoid fried foods and butter.  4)  Weight/Diet:  her Body mass index is 50.64 kg/m.  -  clearly complicating her diabetes care.   she is a candidate for weight loss. I discussed  with her the fact that loss of 5 - 10% of her  current body weight will have the most impact on her diabetes management.  Exercise, and detailed carbohydrates information provided  -  detailed on discharge instructions.  5) Chronic Care/Health Maintenance: -she is on ACEI/ARB and Statin medications and is encouraged to initiate and continue to follow up with Ophthalmology, Dentist, Podiatrist at least yearly or according to recommendations, and advised to stay away from smoking. I have recommended yearly flu vaccine and pneumonia vaccine at least every 5 years; moderate intensity exercise for up to 150 minutes weekly; and sleep for at least 7 hours a day.  - she is advised to maintain close follow up with Celene Squibb, MD for primary care needs, as well as her other providers for optimal and coordinated care.       I spent 46 minutes in the care of the patient today including review of labs from [place], Lipids, Thyroid Function, Hematology (current and previous including abstractions from other facilities); face-to-face time discussing  her blood glucose readings/logs, discussing hypoglycemia and hyperglycemia episodes and symptoms, medications doses, her options of short and long term treatment based on  the latest standards of care / guidelines;  discussion about incorporating lifestyle medicine;  and documenting the encounter. Risk reduction counseling performed per USPSTF guidelines to reduce obesity and cardiovascular risk factors.     Please refer to Patient Instructions for Blood Glucose Monitoring and Insulin/Medications Dosing Guide"  in media tab for additional information. Please  also refer to " Patient Self Inventory" in the Media  tab for reviewed elements of pertinent patient history.  Nicole [name] participated in the discussions, expressed understanding, and voiced agreement with the above plans.  All questions were answered to her satisfaction. she is encouraged to contact clinic  should she have any questions or concerns prior to her return visit.     Follow up plan: - Return in about 3 months (around 02/02/2022) for Diabetes F/U with A1c in office, No previsit labs, Bring meter and logs.   Rayetta Pigg, [facility] [address] [place], [place] [address] Phone: [phone] Fax: [phone]  11/03/2021, 11:59 AM

## 2021-11-10 ENCOUNTER — Ambulatory Visit (INDEPENDENT_AMBULATORY_CARE_PROVIDER_SITE_OTHER): Payer: Medicare Other | Admitting: Podiatry

## 2021-11-10 DIAGNOSIS — B351 Tinea unguium: Secondary | ICD-10-CM

## 2021-11-10 DIAGNOSIS — M79674 Pain in right toe(s): Secondary | ICD-10-CM | POA: Diagnosis not present

## 2021-11-10 NOTE — Progress Notes (Signed)
   Chief Complaint  Patient presents with   foot care     Patient is here for right foot diabetic foot care.    SUBJECTIVE Patient with a history of diabetes mellitus presents to office today complaining of elongated, thickened nails that cause pain while ambulating in shoes.  Patient is unable to trim their own nails. Patient is here for further evaluation and treatment.   Past Medical History:  Diagnosis Date   CAP (community acquired pneumonia)    Streptococcus 01/2011   COPD (chronic obstructive pulmonary disease) ([facility])    Coronary atherosclerosis of native coronary artery    a. Diagnosed [place] 2006 - DES RCA, reports followup cath 2009 at [facility] b. reported 2 stents in [place] in 2020. c. 07/2021: cath showing patent stents along RCA and mid-LAD with scattered 20% stenosis but no obstructive disease.   Dyslipidemia    Essential hypertension, benign    Morbid obesity ([place])    Pancreatitis    Type 2 diabetes mellitus ([place])     OBJECTIVE General Patient is awake, alert, and oriented x 3 and in no acute distress. Derm Skin is dry. Negative open lesions or macerations. Remaining integument unremarkable. Nails are tender, long, thickened and dystrophic with subungual debris, consistent with onychomycosis, 1-5 right.  No signs of infection noted. Vasc pulses palpable RLE.  Skin warm to touch. Neuro light touch and protective threshold sensation diminished right Musculoskeletal Exam history of BKA LLE  ASSESSMENT 1. Diabetes Mellitus w/ peripheral neuropathy 2.  Pain due to onychomycosis of toenails right  PLAN OF CARE 1. Patient evaluated today. 2. Instructed to maintain good pedal hygiene and foot care. Stressed importance of controlling blood sugar.  3. Mechanical debridement of nails 1-5 bilaterally performed using a nail nipper. Filed with dremel right incident.  4. Return to clinic in 3 mos. for routine foot care    Edrick Kins, DPM [facility]  Dr. Edrick Kins, DPM    [address], [place] [address]                Office [phone]  Fax [phone]

## 2021-11-13 DIAGNOSIS — E1165 Type 2 diabetes mellitus with hyperglycemia: Secondary | ICD-10-CM | POA: Diagnosis not present

## 2021-11-18 ENCOUNTER — Ambulatory Visit (INDEPENDENT_AMBULATORY_CARE_PROVIDER_SITE_OTHER): Payer: Medicare Other

## 2021-11-18 DIAGNOSIS — E669 Obesity, unspecified: Secondary | ICD-10-CM | POA: Diagnosis not present

## 2021-11-18 DIAGNOSIS — E1169 Type 2 diabetes mellitus with other specified complication: Secondary | ICD-10-CM

## 2021-11-18 NOTE — Progress Notes (Signed)
Went over usage and placement instructions for the Dexcom 7. Pt voiced understanding. Placed new Dexcom 7 sensor on patients L arm.

## 2021-11-18 NOTE — Patient Instructions (Signed)
Call office with any questions/concerns

## 2021-11-19 DIAGNOSIS — J449 Chronic obstructive pulmonary disease, unspecified: Secondary | ICD-10-CM | POA: Diagnosis not present

## 2021-11-23 DIAGNOSIS — J449 Chronic obstructive pulmonary disease, unspecified: Secondary | ICD-10-CM | POA: Diagnosis not present

## 2021-11-24 DIAGNOSIS — M6281 Muscle weakness (generalized): Secondary | ICD-10-CM | POA: Diagnosis not present

## 2021-11-30 DIAGNOSIS — M6281 Muscle weakness (generalized): Secondary | ICD-10-CM | POA: Diagnosis not present

## 2021-12-02 IMAGING — DX DG KNEE COMPLETE 4+V*R*
4 series · 4 of 4 positions shown · non-contrast
Comparison: None.

CLINICAL DATA: Right knee pain after injury last month.

EXAM:
RIGHT KNEE - COMPLETE 4+ VIEW

[knee ap]
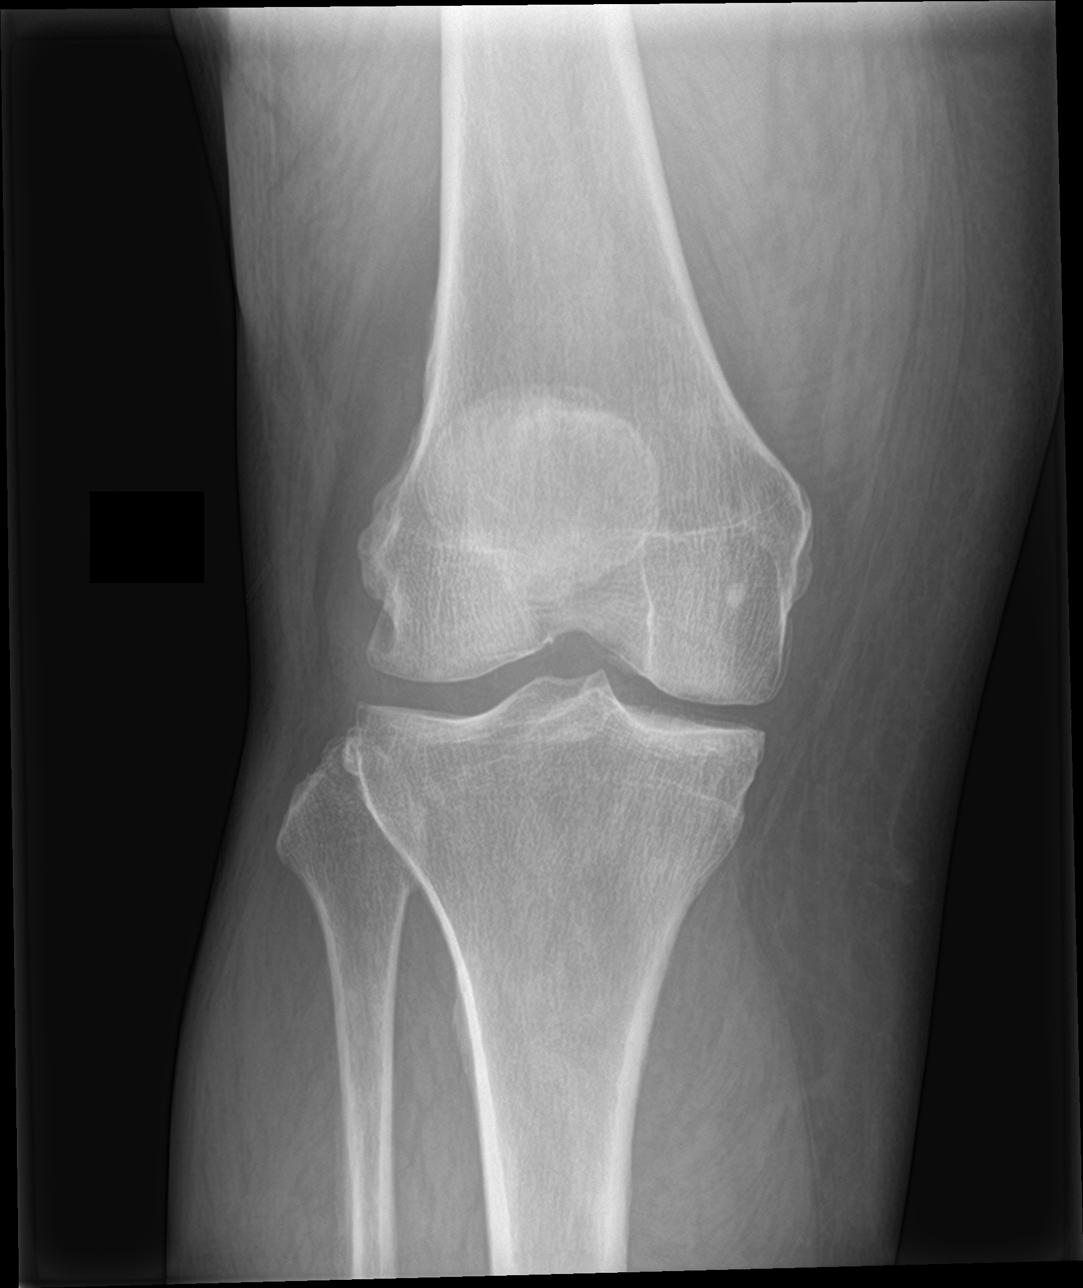

[knee obl (1 of 2)]
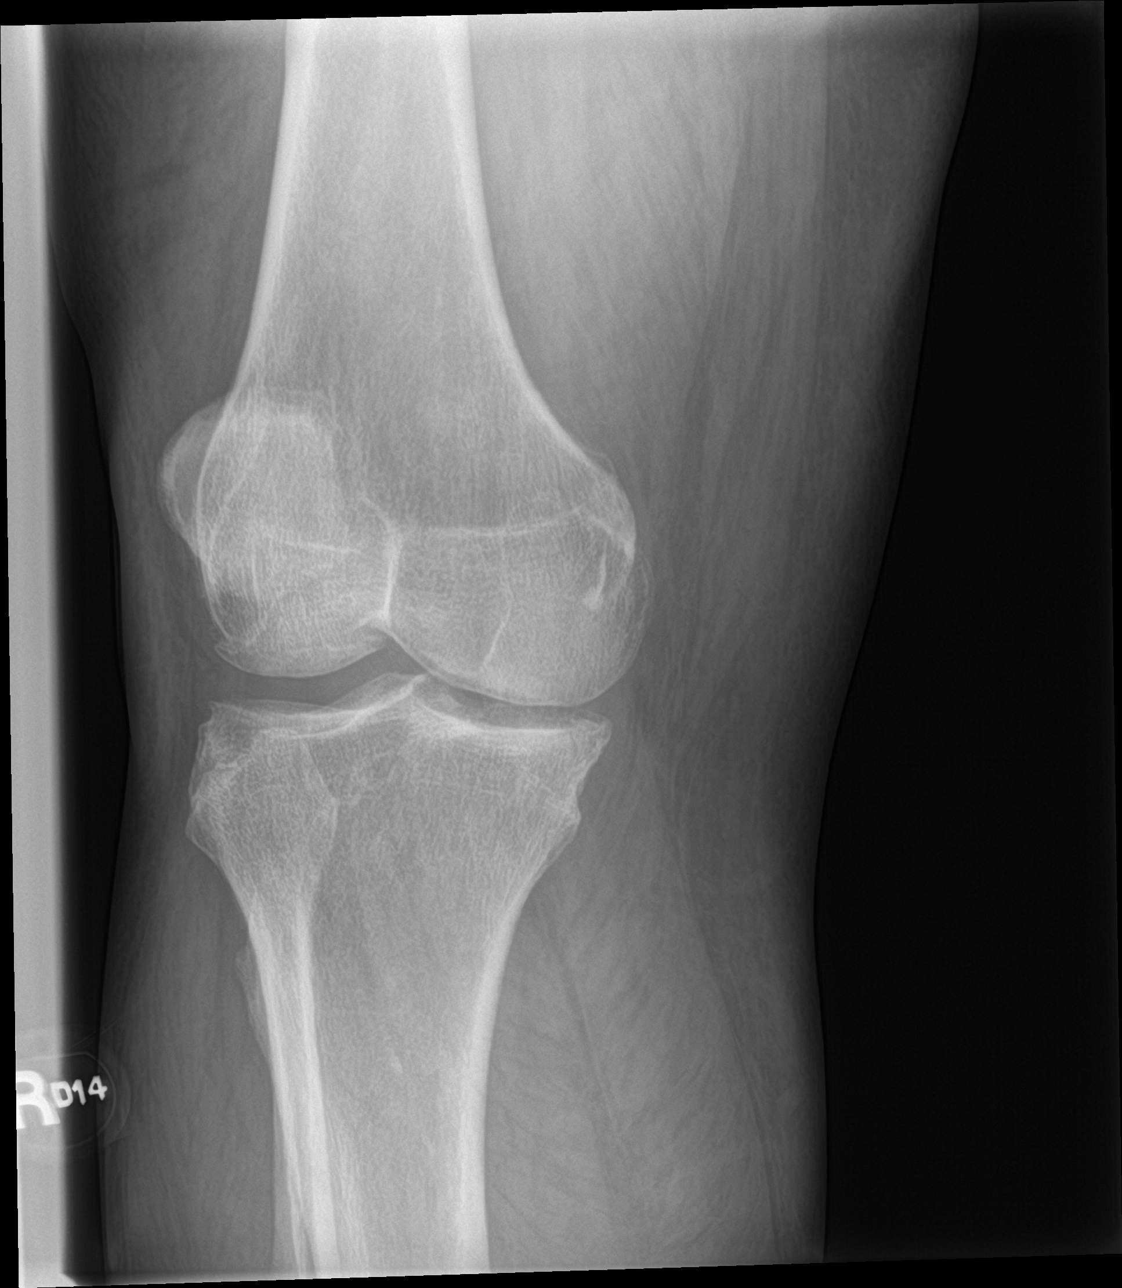

[knee obl (2 of 2)]
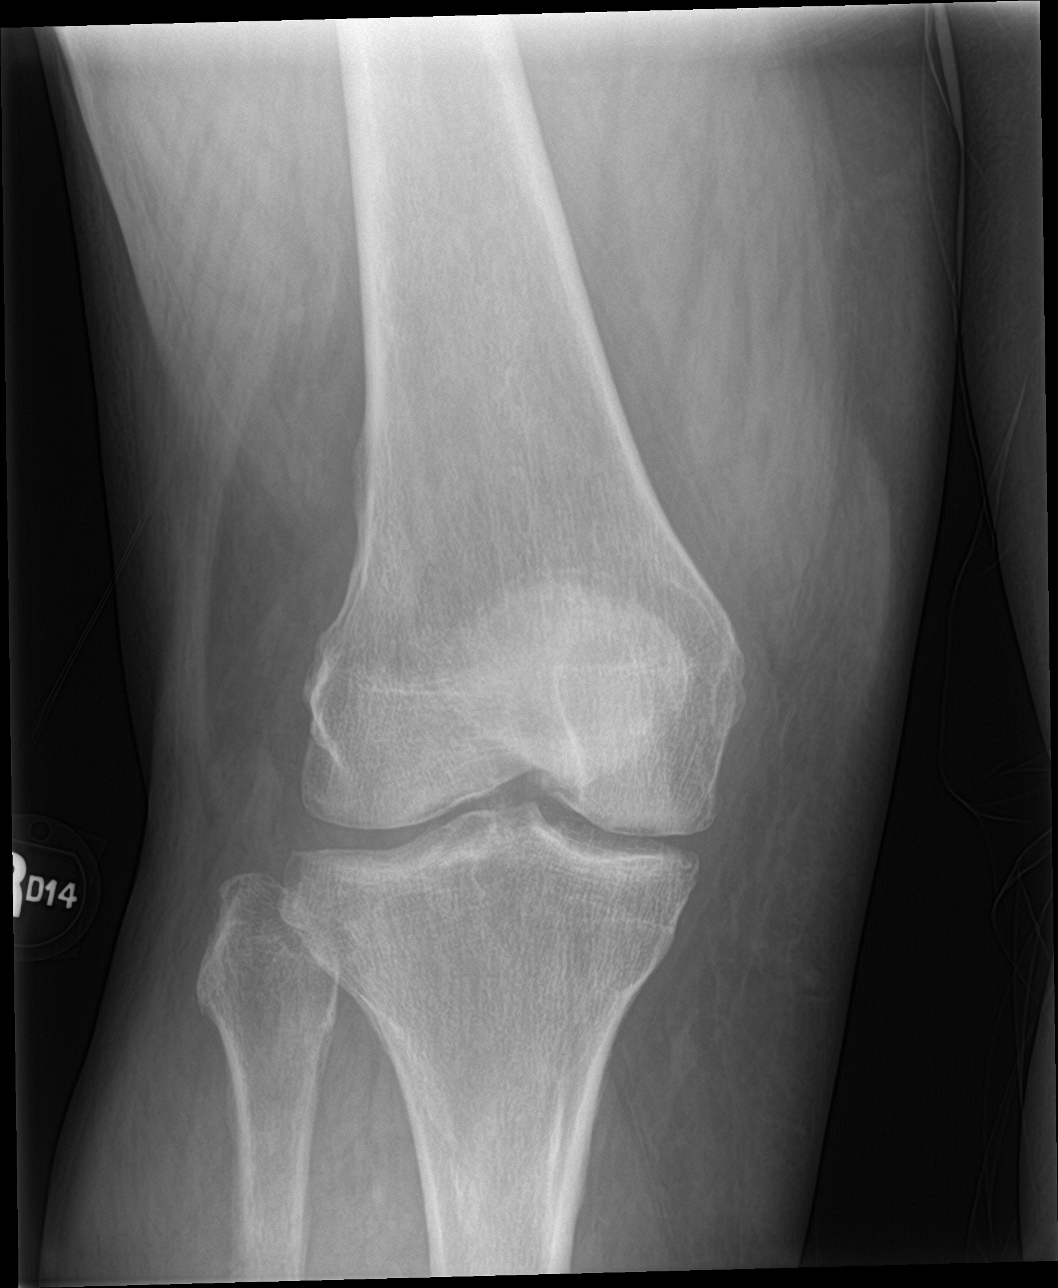

[knee lat]
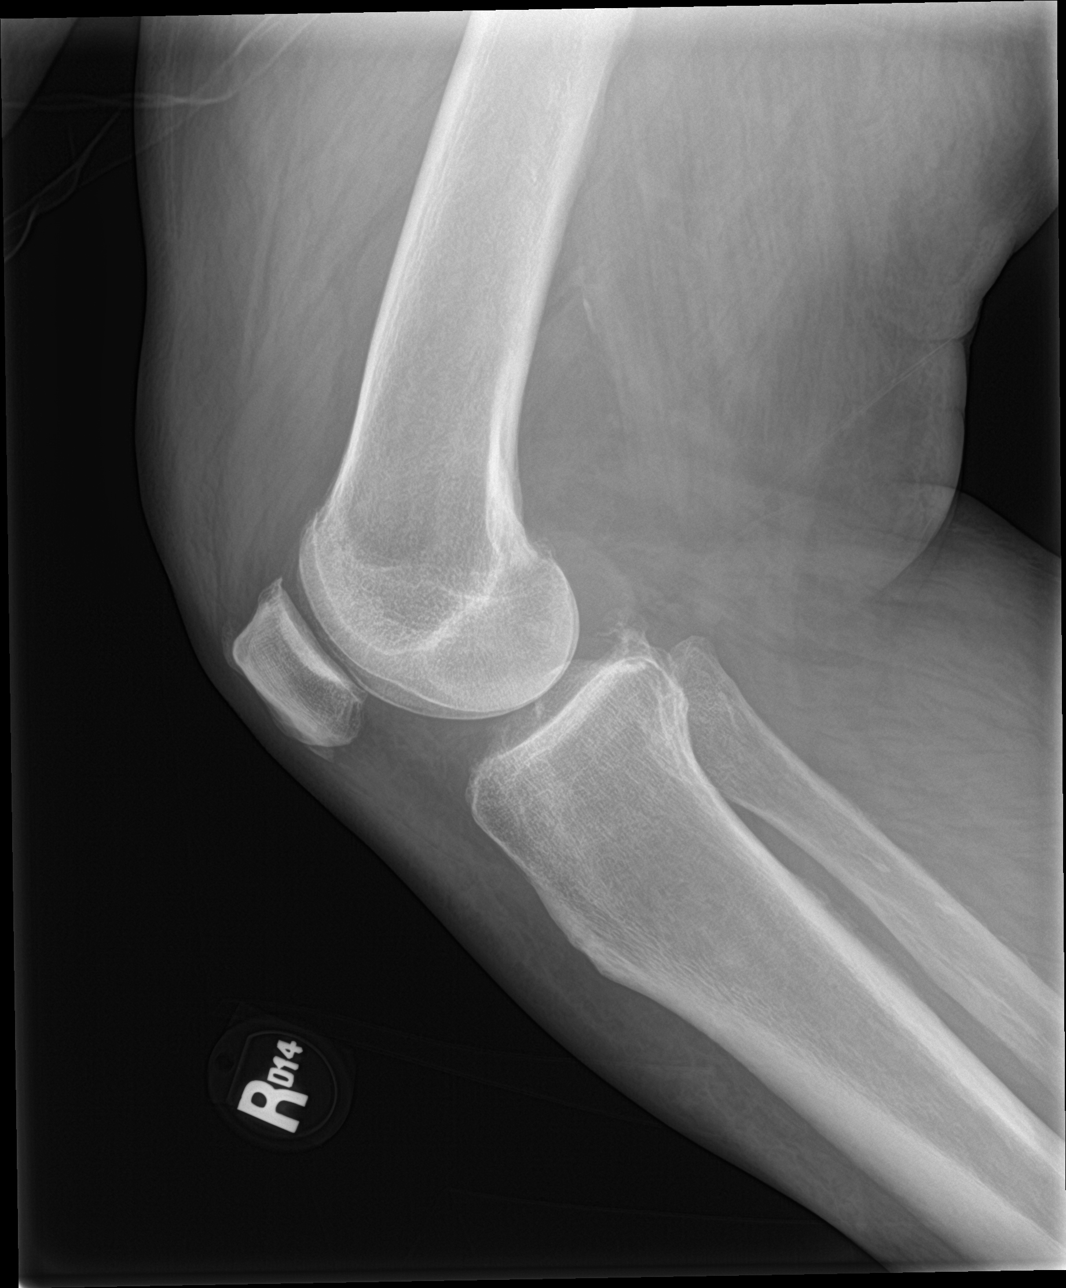

[4 of 4 positions shown; findings below may reference images not displayed]

FINDINGS: No evidence of fracture, dislocation, or joint effusion. No evidence
of arthropathy or other focal bone abnormality. Soft tissues are
unremarkable.
IMPRESSION: Negative.

## 2021-12-08 ENCOUNTER — Other Ambulatory Visit: Payer: Self-pay

## 2021-12-08 ENCOUNTER — Emergency Department (HOSPITAL_COMMUNITY): Payer: Medicare Other

## 2021-12-08 ENCOUNTER — Inpatient Hospital Stay (HOSPITAL_COMMUNITY)
Admission: EM | Admit: 2021-12-08 | Discharge: 2021-12-17 | DRG: 291 | Disposition: A | Discharge status: Home / Self Care | Payer: Medicare Other | Attending: Internal Medicine | Admitting: Internal Medicine

## 2021-12-08 ENCOUNTER — Encounter (HOSPITAL_COMMUNITY): Payer: Self-pay | Admitting: *Deleted

## 2021-12-08 DIAGNOSIS — Z8041 Family history of malignant neoplasm of ovary: Secondary | ICD-10-CM

## 2021-12-08 DIAGNOSIS — Z6841 Body Mass Index (BMI) 40.0 and over, adult: Secondary | ICD-10-CM

## 2021-12-08 DIAGNOSIS — J9811 Atelectasis: Secondary | ICD-10-CM | POA: Diagnosis not present

## 2021-12-08 DIAGNOSIS — Z8249 Family history of ischemic heart disease and other diseases of the circulatory system: Secondary | ICD-10-CM

## 2021-12-08 DIAGNOSIS — F32A Depression, unspecified: Secondary | ICD-10-CM | POA: Diagnosis not present

## 2021-12-08 DIAGNOSIS — G459 Transient cerebral ischemic attack, unspecified: Secondary | ICD-10-CM | POA: Diagnosis not present

## 2021-12-08 DIAGNOSIS — J449 Chronic obstructive pulmonary disease, unspecified: Secondary | ICD-10-CM | POA: Diagnosis not present

## 2021-12-08 DIAGNOSIS — I480 Paroxysmal atrial fibrillation: Secondary | ICD-10-CM | POA: Diagnosis present

## 2021-12-08 DIAGNOSIS — E1122 Type 2 diabetes mellitus with diabetic chronic kidney disease: Secondary | ICD-10-CM | POA: Diagnosis present

## 2021-12-08 DIAGNOSIS — Z743 Need for continuous supervision: Secondary | ICD-10-CM | POA: Diagnosis not present

## 2021-12-08 DIAGNOSIS — I5031 Acute diastolic (congestive) heart failure: Secondary | ICD-10-CM | POA: Diagnosis present

## 2021-12-08 DIAGNOSIS — I4510 Unspecified right bundle-branch block: Secondary | ICD-10-CM | POA: Diagnosis not present

## 2021-12-08 DIAGNOSIS — Z955 Presence of coronary angioplasty implant and graft: Secondary | ICD-10-CM | POA: Diagnosis not present

## 2021-12-08 DIAGNOSIS — Z825 Family history of asthma and other chronic lower respiratory diseases: Secondary | ICD-10-CM

## 2021-12-08 DIAGNOSIS — F419 Anxiety disorder, unspecified: Secondary | ICD-10-CM | POA: Diagnosis present

## 2021-12-08 DIAGNOSIS — G4733 Obstructive sleep apnea (adult) (pediatric): Secondary | ICD-10-CM | POA: Diagnosis not present

## 2021-12-08 DIAGNOSIS — Z833 Family history of diabetes mellitus: Secondary | ICD-10-CM

## 2021-12-08 DIAGNOSIS — E785 Hyperlipidemia, unspecified: Secondary | ICD-10-CM | POA: Diagnosis not present

## 2021-12-08 DIAGNOSIS — Z9109 Other allergy status, other than to drugs and biological substances: Secondary | ICD-10-CM

## 2021-12-08 DIAGNOSIS — I10 Essential (primary) hypertension: Secondary | ICD-10-CM | POA: Diagnosis not present

## 2021-12-08 DIAGNOSIS — I472 Ventricular tachycardia, unspecified: Secondary | ICD-10-CM | POA: Diagnosis not present

## 2021-12-08 DIAGNOSIS — I25118 Atherosclerotic heart disease of native coronary artery with other forms of angina pectoris: Secondary | ICD-10-CM | POA: Diagnosis not present

## 2021-12-08 DIAGNOSIS — Z79899 Other long term (current) drug therapy: Secondary | ICD-10-CM | POA: Diagnosis not present

## 2021-12-08 DIAGNOSIS — Z7902 Long term (current) use of antithrombotics/antiplatelets: Secondary | ICD-10-CM

## 2021-12-08 DIAGNOSIS — Z881 Allergy status to other antibiotic agents status: Secondary | ICD-10-CM

## 2021-12-08 DIAGNOSIS — R079 Chest pain, unspecified: Secondary | ICD-10-CM | POA: Diagnosis not present

## 2021-12-08 DIAGNOSIS — R0902 Hypoxemia: Secondary | ICD-10-CM | POA: Diagnosis present

## 2021-12-08 DIAGNOSIS — I25119 Atherosclerotic heart disease of native coronary artery with unspecified angina pectoris: Secondary | ICD-10-CM | POA: Diagnosis present

## 2021-12-08 DIAGNOSIS — E1165 Type 2 diabetes mellitus with hyperglycemia: Secondary | ICD-10-CM

## 2021-12-08 DIAGNOSIS — I5033 Acute on chronic diastolic (congestive) heart failure: Secondary | ICD-10-CM

## 2021-12-08 DIAGNOSIS — N1831 Chronic kidney disease, stage 3a: Secondary | ICD-10-CM | POA: Diagnosis present

## 2021-12-08 DIAGNOSIS — Z91030 Bee allergy status: Secondary | ICD-10-CM

## 2021-12-08 DIAGNOSIS — I130 Hypertensive heart and chronic kidney disease with heart failure and stage 1 through stage 4 chronic kidney disease, or unspecified chronic kidney disease: Principal | ICD-10-CM | POA: Diagnosis present

## 2021-12-08 DIAGNOSIS — Z7984 Long term (current) use of oral hypoglycemic drugs: Secondary | ICD-10-CM

## 2021-12-08 DIAGNOSIS — R6889 Other general symptoms and signs: Secondary | ICD-10-CM | POA: Diagnosis not present

## 2021-12-08 DIAGNOSIS — I209 Angina pectoris, unspecified: Secondary | ICD-10-CM | POA: Diagnosis not present

## 2021-12-08 DIAGNOSIS — I2510 Atherosclerotic heart disease of native coronary artery without angina pectoris: Secondary | ICD-10-CM | POA: Diagnosis present

## 2021-12-08 DIAGNOSIS — Z89512 Acquired absence of left leg below knee: Secondary | ICD-10-CM

## 2021-12-08 DIAGNOSIS — E66813 Obesity, class 3: Secondary | ICD-10-CM

## 2021-12-08 DIAGNOSIS — Z8673 Personal history of transient ischemic attack (TIA), and cerebral infarction without residual deficits: Secondary | ICD-10-CM

## 2021-12-08 DIAGNOSIS — I493 Ventricular premature depolarization: Secondary | ICD-10-CM | POA: Diagnosis present

## 2021-12-08 DIAGNOSIS — R0789 Other chest pain: Secondary | ICD-10-CM | POA: Diagnosis not present

## 2021-12-08 DIAGNOSIS — R0602 Shortness of breath: Secondary | ICD-10-CM | POA: Diagnosis not present

## 2021-12-08 DIAGNOSIS — R140 Abdominal distension (gaseous): Secondary | ICD-10-CM | POA: Diagnosis present

## 2021-12-08 DIAGNOSIS — Z794 Long term (current) use of insulin: Secondary | ICD-10-CM

## 2021-12-08 DIAGNOSIS — I2089 Other forms of angina pectoris: Principal | ICD-10-CM

## 2021-12-08 DIAGNOSIS — D631 Anemia in chronic kidney disease: Secondary | ICD-10-CM | POA: Diagnosis present

## 2021-12-08 DIAGNOSIS — Z841 Family history of disorders of kidney and ureter: Secondary | ICD-10-CM

## 2021-12-08 DIAGNOSIS — Z886 Allergy status to analgesic agent status: Secondary | ICD-10-CM

## 2021-12-08 LAB — CBC WITH DIFFERENTIAL/PLATELET
Abs Immature Granulocytes: 0.05 K/uL (ref 0.00–0.07)
Basophils Absolute: 0.1 K/uL (ref 0.0–0.1)
Basophils Relative: 1 %
Eosinophils Absolute: 0.3 K/uL (ref 0.0–0.5)
Eosinophils Relative: 3 %
HCT: 33.7 % — ABNORMAL LOW (ref 36.0–46.0)
Hemoglobin: 10.6 g/dL — ABNORMAL LOW (ref 12.0–15.0)
Immature Granulocytes: 1 %
Lymphocytes Relative: 23 %
Lymphs Abs: 2.2 K/uL (ref 0.7–4.0)
MCH: 31 pg (ref 26.0–34.0)
MCHC: 31.5 g/dL (ref 30.0–36.0)
MCV: 98.5 fL (ref 80.0–100.0)
Monocytes Absolute: 0.7 K/uL (ref 0.1–1.0)
Monocytes Relative: 7 %
Neutro Abs: 6.5 K/uL (ref 1.7–7.7)
Neutrophils Relative %: 65 %
Platelets: 181 K/uL (ref 150–400)
RBC: 3.42 MIL/uL — ABNORMAL LOW (ref 3.87–5.11)
RDW: 14.2 % (ref 11.5–15.5)
WBC: 9.9 K/uL (ref 4.0–10.5)
nRBC: 0 % (ref 0.0–0.2)

## 2021-12-08 LAB — COMPREHENSIVE METABOLIC PANEL WITH GFR
ALT: 19 U/L (ref 0–44)
AST: 26 U/L (ref 15–41)
Albumin: 3.2 g/dL — ABNORMAL LOW (ref 3.5–5.0)
Alkaline Phosphatase: 79 U/L (ref 38–126)
Anion gap: 8 (ref 5–15)
BUN: 25 mg/dL — ABNORMAL HIGH (ref 6–20)
CO2: 28 mmol/L (ref 22–32)
Calcium: 8.2 mg/dL — ABNORMAL LOW (ref 8.9–10.3)
Chloride: 105 mmol/L (ref 98–111)
Creatinine, Ser: 0.83 mg/dL (ref 0.44–1.00)
GFR, Estimated: >60 mL/min (ref >60)
Glucose, Bld: 323 mg/dL — ABNORMAL HIGH (ref 70–99)
Potassium: 3.9 mmol/L (ref 3.5–5.1)
Sodium: 141 mmol/L (ref 135–145)
Total Bilirubin: 0.4 mg/dL (ref 0.3–1.2)
Total Protein: 6.9 g/dL (ref 6.5–8.1)

## 2021-12-08 LAB — CBC
HCT: 33.2 % — ABNORMAL LOW (ref 36.0–46.0)
Hemoglobin: 10.6 g/dL — ABNORMAL LOW (ref 12.0–15.0)
MCH: 31.1 pg (ref 26.0–34.0)
MCHC: 31.9 g/dL (ref 30.0–36.0)
MCV: 97.4 fL (ref 80.0–100.0)
Platelets: 190 10*3/uL (ref 150–400)
RBC: 3.41 MIL/uL — ABNORMAL LOW (ref 3.87–5.11)
RDW: 14.3 % (ref 11.5–15.5)
WBC: 10.5 10*3/uL (ref 4.0–10.5)
nRBC: 0 % (ref 0.0–0.2)

## 2021-12-08 LAB — TROPONIN I (HIGH SENSITIVITY)
Troponin I (High Sensitivity): 15 ng/L (ref <18)
Troponin I (High Sensitivity): 14 ng/L (ref <18)
Troponin I (High Sensitivity): 13 ng/L (ref <18)
Troponin I (High Sensitivity): 12 ng/L (ref <18)

## 2021-12-08 LAB — HEMOGLOBIN A1C
Hgb A1c MFr Bld: 9.5 % — ABNORMAL HIGH (ref 4.8–5.6)
Mean Plasma Glucose: 225.95 mg/dL

## 2021-12-08 LAB — CREATININE, SERUM
Creatinine, Ser: 0.93 mg/dL (ref 0.44–1.00)
GFR, Estimated: >60 mL/min (ref >60)

## 2021-12-08 LAB — BRAIN NATRIURETIC PEPTIDE: B Natriuretic Peptide: 80 pg/mL (ref 0.0–100.0)

## 2021-12-08 LAB — CBG MONITORING, ED: Glucose-Capillary: 280 mg/dL — ABNORMAL HIGH (ref 70–99)

## 2021-12-08 LAB — GLUCOSE, CAPILLARY: Glucose-Capillary: 219 mg/dL — ABNORMAL HIGH (ref 70–99)

## 2021-12-08 MED ORDER — FUROSEMIDE 40 MG PO TABS
40.0000 mg | ORAL_TABLET | Freq: Every day | ORAL | Status: DC
  Filled 2021-12-08: qty 1

## 2021-12-08 MED ORDER — FUROSEMIDE 40 MG PO TABS
40.0000 mg | ORAL_TABLET | Freq: Once | ORAL | Status: AC
  Administered 2021-12-08: 40 mg via ORAL
  Filled 2021-12-08: qty 1

## 2021-12-08 MED ORDER — ALPRAZOLAM 0.5 MG PO TABS: 0.2500 mg | ORAL_TABLET | Freq: Two times a day (BID) | ORAL | Status: DC | PRN

## 2021-12-08 MED ORDER — NITROGLYCERIN 0.4 MG SL SUBL
0.4000 mg | SUBLINGUAL_TABLET | Freq: Once | SUBLINGUAL | Status: AC
  Administered 2021-12-08: 0.4 mg via SUBLINGUAL

## 2021-12-08 MED ORDER — ACETAMINOPHEN 325 MG PO TABS
650.0000 mg | ORAL_TABLET | Freq: Four times a day (QID) | ORAL | Status: DC | PRN
  Administered 2021-12-08: 650 mg via ORAL
  Filled 2021-12-08: qty 2

## 2021-12-08 MED ORDER — MAGNESIUM SULFATE 2 GM/50ML IV SOLN
2.0000 g | Freq: Once | INTRAVENOUS | Status: AC
  Administered 2021-12-08: 2 g via INTRAVENOUS
  Filled 2021-12-08: qty 50

## 2021-12-08 MED ORDER — INSULIN ASPART 100 UNIT/ML IJ SOLN
0.0000 [IU] | Freq: Three times a day (TID) | INTRAMUSCULAR | Status: DC
  Administered 2021-12-08: 5 [IU] via SUBCUTANEOUS
  Administered 2021-12-09 (×2): 11 [IU] via SUBCUTANEOUS
  Administered 2021-12-09: 8 [IU] via SUBCUTANEOUS

## 2021-12-08 MED ORDER — SODIUM CHLORIDE 0.9 % IV SOLN: INTRAVENOUS | Status: DC

## 2021-12-08 MED ORDER — METOPROLOL TARTRATE 50 MG PO TABS
50.0000 mg | ORAL_TABLET | Freq: Two times a day (BID) | ORAL | Status: DC
  Administered 2021-12-08 – 2021-12-12 (×8): 50 mg via ORAL
  Filled 2021-12-08 (×8): qty 1

## 2021-12-08 MED ORDER — GERHARDT'S BUTT CREAM
1.0000 | TOPICAL_CREAM | Freq: Two times a day (BID) | CUTANEOUS | Status: DC
  Administered 2021-12-10 – 2021-12-16 (×4): 1 via TOPICAL
  Filled 2021-12-08 (×2): qty 1

## 2021-12-08 MED ORDER — INSULIN ASPART 100 UNIT/ML IJ SOLN: 0.0000 [IU] | Freq: Three times a day (TID) | INTRAMUSCULAR | Status: DC

## 2021-12-08 MED ORDER — BUPROPION HCL ER (SR) 100 MG PO TB12
100.0000 mg | ORAL_TABLET | Freq: Two times a day (BID) | ORAL | Status: DC
  Administered 2021-12-08 – 2021-12-17 (×18): 100 mg via ORAL
  Filled 2021-12-08 (×20): qty 1

## 2021-12-08 MED ORDER — FINERENONE 10 MG PO TABS: 1.0000 | ORAL_TABLET | Freq: Every day | ORAL | Status: DC

## 2021-12-08 MED ORDER — IOHEXOL 300 MG/ML  SOLN
100.0000 mL | Freq: Once | INTRAMUSCULAR | Status: AC | PRN
  Administered 2021-12-08: 100 mL via INTRAVENOUS

## 2021-12-08 MED ORDER — ONDANSETRON HCL 4 MG/2ML IJ SOLN: 4.0000 mg | Freq: Four times a day (QID) | INTRAMUSCULAR | Status: DC | PRN

## 2021-12-08 MED ORDER — PANTOPRAZOLE SODIUM 40 MG PO TBEC
40.0000 mg | DELAYED_RELEASE_TABLET | Freq: Every day | ORAL | Status: DC
  Administered 2021-12-08 – 2021-12-16 (×9): 40 mg via ORAL
  Filled 2021-12-08 (×9): qty 1

## 2021-12-08 MED ORDER — ZOLPIDEM TARTRATE 5 MG PO TABS
5.0000 mg | ORAL_TABLET | Freq: Every evening | ORAL | Status: DC | PRN
  Administered 2021-12-08 – 2021-12-14 (×6): 5 mg via ORAL
  Filled 2021-12-08 (×6): qty 1

## 2021-12-08 MED ORDER — INSULIN GLARGINE-YFGN 100 UNIT/ML ~~LOC~~ SOLN
15.0000 [IU] | Freq: Every day | SUBCUTANEOUS | Status: DC
  Filled 2021-12-08: qty 0.15

## 2021-12-08 MED ORDER — HYDRALAZINE HCL 20 MG/ML IJ SOLN
10.0000 mg | INTRAMUSCULAR | Status: DC | PRN
  Administered 2021-12-08: 10 mg via INTRAVENOUS
  Filled 2021-12-08: qty 1

## 2021-12-08 MED ORDER — ACETAMINOPHEN 325 MG PO TABS
650.0000 mg | ORAL_TABLET | ORAL | Status: DC | PRN
  Administered 2021-12-09 – 2021-12-15 (×8): 650 mg via ORAL
  Filled 2021-12-08 (×9): qty 2

## 2021-12-08 MED ORDER — ALPRAZOLAM 0.5 MG PO TABS
0.5000 mg | ORAL_TABLET | Freq: Two times a day (BID) | ORAL | Status: DC | PRN
  Administered 2021-12-08 – 2021-12-16 (×8): 0.5 mg via ORAL
  Filled 2021-12-08 (×8): qty 1

## 2021-12-08 MED ORDER — IPRATROPIUM-ALBUTEROL 0.5-2.5 (3) MG/3ML IN SOLN
RESPIRATORY_TRACT | Status: AC
  Administered 2021-12-08: 3 mL
  Filled 2021-12-08: qty 3

## 2021-12-08 MED ORDER — SODIUM CHLORIDE 0.9 % IV SOLN
INTRAVENOUS | Status: DC
  Administered 2021-12-08: 50 mL/h via INTRAVENOUS

## 2021-12-08 MED ORDER — ROSUVASTATIN CALCIUM 20 MG PO TABS
40.0000 mg | ORAL_TABLET | Freq: Every day | ORAL | Status: DC
  Administered 2021-12-08 – 2021-12-16 (×9): 40 mg via ORAL
  Filled 2021-12-08 (×9): qty 2

## 2021-12-08 MED ORDER — INSULIN GLARGINE-YFGN 100 UNIT/ML ~~LOC~~ SOLN
20.0000 [IU] | Freq: Every day | SUBCUTANEOUS | Status: DC
  Administered 2021-12-08: 20 [IU] via SUBCUTANEOUS
  Filled 2021-12-08 (×2): qty 0.2

## 2021-12-08 MED ORDER — CLOPIDOGREL BISULFATE 75 MG PO TABS
75.0000 mg | ORAL_TABLET | Freq: Every day | ORAL | Status: DC
  Administered 2021-12-09 – 2021-12-17 (×9): 75 mg via ORAL
  Filled 2021-12-08 (×11): qty 1

## 2021-12-08 MED ORDER — HEPARIN SODIUM (PORCINE) 5000 UNIT/ML IJ SOLN
5000.0000 [IU] | Freq: Three times a day (TID) | INTRAMUSCULAR | Status: DC
  Administered 2021-12-08 – 2021-12-17 (×26): 5000 [IU] via SUBCUTANEOUS
  Filled 2021-12-08 (×27): qty 1

## 2021-12-08 MED ORDER — MUPIROCIN 2 % EX OINT
TOPICAL_OINTMENT | Freq: Every day | CUTANEOUS | Status: AC
  Filled 2021-12-08: qty 22

## 2021-12-08 MED ORDER — NYSTATIN 100000 UNIT/GM EX POWD: 1.0000 | Freq: Every day | CUTANEOUS | Status: DC | PRN

## 2021-12-08 MED ORDER — NITROGLYCERIN 0.4 MG SL SUBL: 0.4000 mg | SUBLINGUAL_TABLET | SUBLINGUAL | Status: DC | PRN

## 2021-12-08 MED ORDER — NITROGLYCERIN 0.4 MG SL SUBL
0.4000 mg | SUBLINGUAL_TABLET | Freq: Once | SUBLINGUAL | Status: AC
  Administered 2021-12-08: 0.4 mg via SUBLINGUAL
  Filled 2021-12-08: qty 1

## 2021-12-08 MED ORDER — CARIPRAZINE HCL 1.5 MG PO CAPS
3.0000 mg | ORAL_CAPSULE | Freq: Every day | ORAL | Status: DC
  Administered 2021-12-09 – 2021-12-17 (×8): 3 mg via ORAL
  Filled 2021-12-08: qty 1
  Filled 2021-12-08 (×10): qty 2

## 2021-12-08 NOTE — Hospital Course (Addendum)
Nicole [name] is a 56 year old female with extensive history of HTN, HLD, DM 2, COPD, h/o diabetic foot, left BKA depression/anxiety, morbid obesity, CAD to stent to LAD, and 1 at RCA, with a recent left heart catheterization on 08/14/2021 -on Plavix and statin.  She presented with shortness of breath and has been admitted with dyspnea on exertion concerning for acute CHF exacerbation.  She has been started on IV diuresis and 2D echocardiogram pending.  Cardiology consulted.

## 2021-12-08 NOTE — ED Notes (Signed)
ED TO INPATIENT HANDOFF REPORT  ED Nurse Name and Phone #: Fabio Neighbors RN [phone]  S Name/Age/Gender Vilinda Blanks Bensch 56 y.o. female Room/Bed: APA11/APA11  Code Status   Code Status: Full Code  Home/SNF/Other Home Patient oriented to: self, place, time, and situation Is this baseline? Yes   Triage Complete: Triage complete  Chief Complaint Chest pain [R07.9]  Triage Note Pt brought in by [facility] for c/o chest pain and sob;    Allergies Allergies  Allergen Reactions   Aspirin Anaphylaxis and Other (See Comments)    Throat closing    Bee Venom Anaphylaxis   Pineapple Anaphylaxis   E-Mycin [Erythromycin Base] Nausea And Vomiting    Level of Care/Admitting Diagnosis ED Disposition     ED Disposition  Admit   Condition  --   [place]: [facility] [id number]  Level of Care: Telemetry [5]  Covid Evaluation: Asymptomatic - no recent exposure (last 10 days) testing not required  Diagnosis: Chest pain [id number]  Admitting Physician: Deatra James [id number]  Attending Physician: Acquanetta Sit          B Medical/Surgery History Past Medical History:  Diagnosis Date   CAP (community acquired pneumonia)    Streptococcus 01/2011   COPD (chronic obstructive pulmonary disease) ([place])    Coronary atherosclerosis of native coronary artery    a. Diagnosed [place] 2006 - DES RCA, reports followup cath 2009 at [facility] b. reported 2 stents in [place] in 2020. c. 07/2021: cath showing patent stents along RCA and mid-LAD with scattered 20% stenosis but no obstructive disease.   Dyslipidemia    Essential hypertension, benign    Morbid obesity ([place])    Pancreatitis    Type 2 diabetes mellitus ([place])    Past Surgical History:  Procedure Laterality Date   ABDOMINAL HERNIA REPAIR     ABDOMINAL HYSTERECTOMY     AMPUTATION Left 01/25/2020   Procedure: LEFT BELOW KNEE AMPUTATION;  Surgeon: Newt Minion, MD;  Location: [place];   Service: Orthopedics;  Laterality: Left;   CORONARY ANGIOPLASTY WITH STENT PLACEMENT  2006   KNEE SURGERY     LEFT HEART CATH AND CORONARY ANGIOGRAPHY N/A 08/14/2021   Procedure: LEFT HEART CATH AND CORONARY ANGIOGRAPHY;  Surgeon: Troy Sine, MD;  Location: [place] [facility];  Service: Cardiovascular;  Laterality: N/A;   NOSE SURGERY     Pilonidal cystectomy     TONSILLECTOMY       A IV Location/Drains/Wounds Patient Lines/Drains/Airways Status     Active Line/Drains/Airways     Name Placement date Placement time Site Days   Peripheral IV 12/08/21 18 G Anterior;Left;Proximal Forearm 12/08/21  0832  Forearm  less than 1   External Urinary Catheter 12/08/21  0908  --  less than 1   Incision (Closed) 01/25/20 Leg Left 01/25/20  1008  -- 683   Wound / Incision (Open or Dehisced) 06/17/21 Non-pressure wound Pretibial Right 06/17/21  2000  Pretibial  174   Wound / Incision (Open or Dehisced) 08/14/21 Irritant Dermatitis (Moisture Associated Skin Damage) Labia Right;Left;Anterior;Posterior 08/14/21  1900  Labia  116            Intake/Output Last 24 hours  Intake/Output Summary (Last 24 hours) at 12/08/2021 1543 Last data filed at 12/08/2021 1308 Gross per 24 hour  Intake --  Output 850 ml  Net -850 ml    Labs/Imaging Results for orders placed or performed during the hospital encounter of 12/08/21 (  from the past 48 hour(s))  CBC with Differential     Status: Abnormal   Collection Time: 12/08/21  9:13 AM  Result Value Ref Range   WBC 9.9 4.0 - 10.5 K/uL   RBC 3.42 (L) 3.87 - 5.11 MIL/uL   Hemoglobin 10.6 (L) 12.0 - 15.0 g/dL   HCT 33.7 (L) 36.0 - 46.0 %   MCV 98.5 80.0 - 100.0 fL   MCH 31.0 26.0 - 34.0 pg   MCHC 31.5 30.0 - 36.0 g/dL   RDW 14.2 11.5 - 15.5 %   Platelets 181 150 - 400 K/uL   nRBC 0.0 0.0 - 0.2 %   Neutrophils Relative % 65 %   Neutro Abs 6.5 1.7 - 7.7 K/uL   Lymphocytes Relative 23 %   Lymphs Abs 2.2 0.7 - 4.0 K/uL   Monocytes Relative 7 %    Monocytes Absolute 0.7 0.1 - 1.0 K/uL   Eosinophils Relative 3 %   Eosinophils Absolute 0.3 0.0 - 0.5 K/uL   Basophils Relative 1 %   Basophils Absolute 0.1 0.0 - 0.1 K/uL   Immature Granulocytes 1 %   Abs Immature Granulocytes 0.05 0.00 - 0.07 K/uL    Comment: Performed at [facility], [address], [place], [place] [address]  Comprehensive metabolic panel     Status: Abnormal   Collection Time: 12/08/21  9:13 AM  Result Value Ref Range   Sodium 141 135 - 145 mmol/L   Potassium 3.9 3.5 - 5.1 mmol/L   Chloride 105 98 - 111 mmol/L   CO2 28 22 - 32 mmol/L   Glucose, Bld 323 (H) 70 - 99 mg/dL    Comment: Glucose reference range applies only to samples taken after fasting for at least 8 hours.   BUN 25 (H) 6 - 20 mg/dL   Creatinine, Ser 0.83 0.44 - 1.00 mg/dL   Calcium 8.2 (L) 8.9 - 10.3 mg/dL   Total Protein 6.9 6.5 - 8.1 g/dL   Albumin 3.2 (L) 3.5 - 5.0 g/dL   AST 26 15 - 41 U/L   ALT 19 0 - 44 U/L   Alkaline Phosphatase 79 38 - 126 U/L   Total Bilirubin 0.4 0.3 - 1.2 mg/dL   GFR, Estimated >60 >60 mL/min    Comment: (NOTE) Calculated using the CKD-EPI Creatinine Equation (2021)    Anion gap 8 5 - 15    Comment: Performed at [facility], [address], [place], [place] [address]  Troponin I (High Sensitivity)     Status: None   Collection Time: 12/08/21  9:13 AM  Result Value Ref Range   Troponin I (High Sensitivity) 15 <18 ng/L    Comment: (NOTE) Elevated high sensitivity troponin I (hsTnI) values and significant  changes across serial measurements may suggest ACS but many other  chronic and acute conditions are known to elevate hsTnI results.  Refer to the "Links" section for chest pain algorithms and additional  guidance. Performed at [facility], [address], [place], [place] [address]   Brain natriuretic peptide     Status: None   Collection Time: 12/08/21  9:16 AM  Result Value Ref Range   B Natriuretic Peptide 80.0 0.0 - 100.0 pg/mL    Comment: Performed at  [facility], [address], [place], [place] [address]  POC CBG, ED     Status: Abnormal   Collection Time: 12/08/21 10:22 AM  Result Value Ref Range   Glucose-Capillary 280 (H) 70 - 99 mg/dL  Comment: Glucose reference range applies only to samples taken after fasting for at least 8 hours.  Troponin I (High Sensitivity)     Status: None   Collection Time: 12/08/21 11:05 AM  Result Value Ref Range   Troponin I (High Sensitivity) 14 <18 ng/L    Comment: (NOTE) Elevated high sensitivity troponin I (hsTnI) values and significant  changes across serial measurements may suggest ACS but many other  chronic and acute conditions are known to elevate hsTnI results.  Refer to the "Links" section for chest pain algorithms and additional  guidance. Performed at [facility], [address] [place], [place] [address]   CBC     Status: Abnormal   Collection Time: 12/08/21  2:35 PM  Result Value Ref Range   WBC 10.5 4.0 - 10.5 K/uL   RBC 3.41 (L) 3.87 - 5.11 MIL/uL   Hemoglobin 10.6 (L) 12.0 - 15.0 g/dL   HCT 33.2 (L) 36.0 - 46.0 %   MCV 97.4 80.0 - 100.0 fL   MCH 31.1 26.0 - 34.0 pg   MCHC 31.9 30.0 - 36.0 g/dL   RDW 14.3 11.5 - 15.5 %   Platelets 190 150 - 400 K/uL   nRBC 0.0 0.0 - 0.2 %    Comment: Performed at [facility], [address], [place], [place] [address]  Creatinine, serum     Status: None   Collection Time: 12/08/21  2:35 PM  Result Value Ref Range   Creatinine, Ser 0.93 0.44 - 1.00 mg/dL   GFR, Estimated >60 >60 mL/min    Comment: (NOTE) Calculated using the CKD-EPI Creatinine Equation (2021) Performed at [facility], [address] [place], [place] [address]   Troponin I (High Sensitivity)     Status: None   Collection Time: 12/08/21  2:35 PM  Result Value Ref Range   Troponin I (High Sensitivity) 13 <18 ng/L    Comment: (NOTE) Elevated high sensitivity troponin I (hsTnI) values and significant  changes across serial measurements may suggest ACS but many other   chronic and acute conditions are known to elevate hsTnI results.  Refer to the "Links" section for chest pain algorithms and additional  guidance. Performed at [facility], [address], [place], [place] [address]    CT Chest W Contrast  Result Date: 12/08/2021 CLINICAL DATA:  COPD exacerbation. Shortness of breath and chest pain. EXAM: CT CHEST WITH CONTRAST TECHNIQUE: Multidetector CT imaging of the chest was performed during intravenous contrast administration. RADIATION DOSE REDUCTION: This exam was performed according to the departmental dose-optimization program which includes automated exposure control, adjustment of the mA and/or kV according to patient size and/or use of iterative reconstruction technique. CONTRAST:  154mL OMNIPAQUE IOHEXOL 300 MG/ML  SOLN COMPARISON:  Radiographs 12/08/2021 and 07/16/2022. FINDINGS: Cardiovascular: No acute vascular findings are demonstrated. There is mild atherosclerosis of the aorta, great vessels and coronary arteries. Mild central enlargement of the pulmonary arteries which may indicate pulmonary arterial hypertension. The heart size is normal. There is no pericardial effusion. Mediastinum/Nodes: There are no enlarged mediastinal, hilar or axillary lymph nodes. The thyroid gland, trachea and esophagus demonstrate no significant findings. Lungs/Pleura: No pleural effusion or pneumothorax. Minimal atelectasis at the lung bases. No confluent airspace opacity or suspicious pulmonary nodule. Upper abdomen: Diffuse low density throughout the liver consistent with steatosis. The hepatic contours are irregular, and there is relative enlargement of the left lobe suspicious for early cirrhosis. No focal hepatic lesions are identified. Small lymph nodes in the porta  hepatis are likely reactive. Musculoskeletal/Chest wall: There is no chest wall mass or suspicious osseous finding. Mild thoracic spondylosis. Mild dependent edema within the chest wall. IMPRESSION: 1. No  acute findings or explanation for the patient's symptoms. 2. Mild central enlargement of the pulmonary arteries may indicate pulmonary arterial hypertension. 3. Hepatic steatosis with possible early cirrhosis. 4.  Aortic Atherosclerosis (ICD10-I70.0). Electronically Signed   By: Richardean Sale M.D.   On: 12/08/2021 10:51   DG Chest Port 1 View  Result Date: 12/08/2021 CLINICAL DATA:  Chest pain and shortness of breath EXAM: PORTABLE CHEST 1 VIEW COMPARISON:  Chest radiograph dated 07/15/2012 FINDINGS: Normal lung volumes. Right basilar patchy and left mid lung linear opacities. Similar appearance of increased interstitial opacities bilaterally. No pleural effusion or pneumothorax. Similar enlarged cardiomediastinal silhouette. The visualized skeletal structures are unremarkable. Radiodensity at the left upper quadrant is likely external to the patient. IMPRESSION: 1. Right basilar patchy opacities may reflect atelectasis, aspiration, or pneumonia. Left mid lung linear opacity, likely atelectasis. 2. Similar appearance of increased interstitial opacities bilaterally which may reflect prominent pulmonary vasculature. 3. Similar cardiomegaly. Electronically Signed   By: Darrin Nipper M.D.   On: 12/08/2021 09:11    Pending Labs Unresulted Labs (From admission, onward)     Start     Ordered   12/09/21 0500  Lipid panel  Tomorrow morning,   R        12/08/21 1422   12/08/21 1421  Hemoglobin A1c  Add-on,   AD       Comments: To assess prior glycemic control    12/08/21 1422            Vitals/Pain Today's Vitals   12/08/21 1333 12/08/21 1400 12/08/21 1500 12/08/21 1505  BP:  115/68 (!) 159/70   Pulse:      Resp:  19 17   SpO2:      Weight:      Height:      PainSc: 5    3     Isolation Precautions No active isolations  Medications Medications  heparin injection 5,000 Units (5,000 Units Subcutaneous Given 12/08/21 1501)  acetaminophen (TYLENOL) tablet 650 mg (has no administration in  time range)  ondansetron (ZOFRAN) injection 4 mg (has no administration in time range)  ALPRAZolam (XANAX) tablet 0.25 mg (has no administration in time range)  zolpidem (AMBIEN) tablet 5 mg (has no administration in time range)  nitroGLYCERIN (NITROSTAT) SL tablet 0.4 mg (has no administration in time range)  furosemide (LASIX) tablet 40 mg (has no administration in time range)  metoprolol tartrate (LOPRESSOR) tablet 50 mg (has no administration in time range)  rosuvastatin (CRESTOR) tablet 40 mg (has no administration in time range)  pantoprazole (PROTONIX) EC tablet 40 mg (has no administration in time range)  clopidogrel (PLAVIX) tablet 75 mg (has no administration in time range)  mupirocin ointment (BACTROBAN) 2 % (has no administration in time range)  Gerhardt's butt cream 1 Application (has no administration in time range)  nystatin (MYCOSTATIN/NYSTOP) topical powder 1 Application (has no administration in time range)  buPROPion ER (WELLBUTRIN SR) 12 hr tablet 100 mg (has no administration in time range)  cariprazine (VRAYLAR) capsule 3 mg (has no administration in time range)  Finerenone TABS 1 tablet (has no administration in time range)  0.9 %  sodium chloride infusion (has no administration in time range)  insulin glargine-yfgn (SEMGLEE) injection 20 Units (has no administration in time range)  insulin aspart (novoLOG) injection 0-15 Units (  has no administration in time range)  ipratropium-albuterol (DUONEB) 0.5-2.5 (3) MG/3ML nebulizer solution (3 mLs  Given 12/08/21 0849)  nitroGLYCERIN (NITROSTAT) SL tablet 0.4 mg (0.4 mg Sublingual Given 12/08/21 0925)  nitroGLYCERIN (NITROSTAT) SL tablet 0.4 mg (0.4 mg Sublingual Given 12/08/21 1013)  iohexol (OMNIPAQUE) 300 MG/ML solution 100 mL (100 mLs Intravenous Contrast Given 12/08/21 1033)    Mobility manual wheelchair Moderate fall risk   Focused Assessments    R Recommendations: See Admitting Provider Note  Report given  to:  [facility] RN  Additional Notes:

## 2021-12-08 NOTE — ED Notes (Signed)
Report given to [facility] on 300

## 2021-12-08 NOTE — Assessment & Plan Note (Addendum)
-   History of diastolic congestive heart failure -Last cardiac cath 6, 23,2023, EJF of 55%, no indication of systolic versus diastolic failure -We will holding her Lasix for today, resuming in a.m. -Gentle IV fluid hydration-mindful of preventing any fluid overload  -We will continue to monitor daily weight

## 2021-12-08 NOTE — Assessment & Plan Note (Signed)
-   Stump clean, no signs of infection

## 2021-12-08 NOTE — Assessment & Plan Note (Signed)
Body mass index is 53.14 kg/m. -Discussed with patient regarding her plan to follow-up with PCP: For strict diet exercise And referral to weight loss clinic -The benefit weight loss we discussed in detail--she expressed understanding

## 2021-12-08 NOTE — Assessment & Plan Note (Signed)
-   Stable continue home medications lovastatin and Plavix

## 2021-12-08 NOTE — Assessment & Plan Note (Signed)
-   Currently stable resuming home medication  -Anticipating holding ACE inhibitors, as needed hydralazine -With gentle IV fluids and ACE inhibitor with holding -optimizing kidney function and protection form possible cardiac cath and dye use

## 2021-12-08 NOTE — ED Notes (Signed)
Pt reports glucose 380 off of her libre monitor, order placed for CBG monitoring

## 2021-12-08 NOTE — Assessment & Plan Note (Addendum)
-   Continue lovastatin  - Checking  lipid panel Lipid Panel     Component Value Date/Time   CHOL 100 08/15/2021 0123   TRIG 217 (H) 08/15/2021 0123   HDL 26 (L) 08/15/2021 0123   CHOLHDL 3.8 08/15/2021 0123   VLDL 43 (H) 08/15/2021 0123   LDLCALC 31 08/15/2021 0123   LDLCALC 65 12/25/2019 1359

## 2021-12-08 NOTE — Assessment & Plan Note (Signed)
-   Chest pain with history of diffuse coronary artery disease, status post cardiac catheterization August 14, 2021, revealing diffuse multivessel coronary artery disease, 1 stent to RCA, 2 stent to LAD patent -At the time recommended medical management with Plavix and statin  -Recycling cardiac enzymes -As needed nitroglycerin -Supplemental oxygen -Continue home medication of aspirin, Plavix  -N.p.o. after midnight cardiology Dr. Domenic Polite has been consulted will follow accordingly -

## 2021-12-08 NOTE — Assessment & Plan Note (Addendum)
-   Hemoglobin A1c on 09/29/2021 was 14.3 -Rechecking A1c -We will holding home med.  Regimen including glipizide and Smaglutide  -We will initiate long-acting insulin 15 units nightly -Checking blood sugar QA CHS with SSI coverage -N.p.o. after midnight

## 2021-12-08 NOTE — ED Provider Notes (Signed)
[facility] EMERGENCY DEPARTMENT Provider Note   CSN: [id number] Arrival date & time: 12/08/21  6712     History {Add pertinent medical, surgical, social history, OB history to HPI:1} Chief Complaint  Patient presents with   Shortness of Breath    Nicole [name] is a 56 y.o. female.  Patient has a history of COPD and coronary artery disease.  She presents with chest pain and dyspnea on exertion   Shortness of Breath      Home Medications Prior to Admission medications   Medication Sig Start Date End Date Taking? Authorizing Provider  acetaminophen (TYLENOL) 500 MG tablet Take 1 tablet (500 mg total) by mouth every 6 (six) hours as needed for moderate pain. 02/05/20  Yes Amin, Ankit Chirag, MD  albuterol (PROVENTIL) (2.5 MG/3ML) 0.083% nebulizer solution Take 3 mLs (2.5 mg total) by nebulization every 6 (six) hours as needed for wheezing. 07/30/20  Yes Ailene Ards, NP  albuterol (VENTOLIN HFA) 108 (90 Base) MCG/ACT inhaler Inhale 2 puffs into the lungs every 6 (six) hours as needed for wheezing. Shortness of breath 07/30/20  Yes Ailene Ards, NP  buPROPion ER [facility] SR) 100 MG 12 hr tablet Take 100 mg by mouth daily.   Yes [provider]  clopidogrel (PLAVIX) 75 MG tablet Take 1 tablet (75 mg total) by mouth daily. 09/09/21  Yes Strader, Fransisco Hertz, PA-C  EPINEPHrine 0.3 mg/0.3 mL IJ SOAJ injection Inject 0.3 mg into the muscle as needed for anaphylaxis. 05/15/20  Yes Ailene Ards, NP  furosemide (LASIX) 40 MG tablet Take 1.5 tablets (60 mg total) by mouth daily. Patient taking differently: Take 40 mg by mouth daily. 08/05/21  Yes Strader, [place], PA-C  glipiZIDE (GLUCOTROL) 10 MG tablet Take 1 tablet (10 mg total) by mouth 2 (two) times daily before a meal. 09/29/21  Yes Reardon, Loree Fee J, NP  insulin regular human CONCENTRATED (HUMULIN R U-500 KWIKPEN) 500 UNIT/ML KwikPen Inject 80 Units into the skin 3 (three) times daily with meals. 09/29/21  Yes Reardon,  Juanetta Beets, NP  KERENDIA 10 MG TABS Take 1 tablet by mouth daily. 09/25/21  Yes [provider]  lisinopril (ZESTRIL) 10 MG tablet Take 10 mg by mouth 2 (two) times daily. 07/10/21  Yes [provider]  loperamide (IMODIUM) 2 MG capsule Take 1 capsule (2 mg total) by mouth every 6 (six) hours as needed for diarrhea or loose stools. 12/10/19  Yes Kathie Dike, MD  metoprolol tartrate (LOPRESSOR) 50 MG tablet Take 1 tablet (50 mg total) by mouth 2 (two) times daily. 07/30/20  Yes Ailene Ards, NP  Multiple Vitamins-Calcium (ONE-A-DAY WOMENS FORMULA) TABS Take 1 tablet by mouth daily.   Yes [provider]  nitroGLYCERIN (NITROSTAT) 0.4 MG SL tablet Take one tablet every 5 minutes as needed for chest pains up to 3 tablets. Patient taking differently: Place 0.4 mg under the tongue every 5 (five) minutes as needed for chest pain (every 5 minutes as needed for chest pains up to 3 tablets.). 10/03/20  Yes Fay Records, MD  Nystatin (GERHARDT'S BUTT CREAM) CREA Apply 1 Application topically 2 (two) times daily. 08/15/21  Yes Bhagat, Bhavinkumar, PA  nystatin powder Apply 1 Application topically daily as needed (irritation).   Yes [provider]  pantoprazole (PROTONIX) 40 MG tablet Take 1 tablet (40 mg total) by mouth daily. Patient taking differently: Take 40 mg by mouth at bedtime. 07/30/20  Yes Ailene Ards, NP  rosuvastatin (  CRESTOR) 40 MG tablet Take 40 mg by mouth at bedtime. 04/16/21  Yes [provider]  Semaglutide, 1 MG/DOSE, 4 MG/3ML SOPN Inject 1 mg as directed once a week. 11/03/21  Yes Brita Romp, NP  Vitamin D, Ergocalciferol, (DRISDOL) 1.25 MG (50000 UNIT) CAPS capsule Take 50,000 Units by mouth every Monday. 04/16/21  Yes [provider]  VRAYLAR 3 MG capsule Take 3 mg by mouth daily. 11/15/21  Yes [provider]  ACCU-CHEK GUIDE test strip USE 1 STRIP TO CHECK GLUCOSE 4 TIMES DAILY 03/11/21   Brita Romp, NP  blood  glucose meter kit and supplies Dispense based on patient and insurance preference. Use four times daily as directed. (FOR ICD-10 E10.9, E11.9). 01/09/21   Brita Romp, NP  EASY TOUCH INSULIN SYRINGE 31G X 5/16" 1 ML MISC  09/21/19   [provider]  Insulin Pen Needle (B-D ULTRAFINE III SHORT PEN) 31G X 8 MM MISC USE AS DIRECTED 07/06/21   [name], NP  mupirocin ointment (BACTROBAN) 2 % SMARTSIG:sparingly Topical 3 Times Daily Patient not taking: Reported on 12/08/2021 09/24/21   [provider]      Allergies    Aspirin, Bee venom, Pineapple, and E-mycin [erythromycin base]    Review of Systems   Review of Systems  Respiratory:  Positive for shortness of breath.     Physical Exam Updated Vital Signs BP 115/68   Pulse 66   Resp 19   Ht 5' 3"  (1.6 m)   Wt 136.1 kg   SpO2 100%   BMI 53.14 kg/m  Physical Exam  ED Results / Procedures / Treatments   Labs (all labs ordered are listed, but only abnormal results are displayed) Labs Reviewed  CBC WITH DIFFERENTIAL/PLATELET - Abnormal; Notable for the following components:      Result Value   RBC 3.42 (*)    Hemoglobin 10.6 (*)    HCT 33.7 (*)    All other components within normal limits  COMPREHENSIVE METABOLIC PANEL - Abnormal; Notable for the following components:   Glucose, Bld 323 (*)    BUN 25 (*)    Calcium 8.2 (*)    Albumin 3.2 (*)    All other components within normal limits  CBG MONITORING, ED - Abnormal; Notable for the following components:   Glucose-Capillary 280 (*)    All other components within normal limits  BRAIN NATRIURETIC PEPTIDE  CBC  CREATININE, SERUM  HEMOGLOBIN A1C  TROPONIN I (HIGH SENSITIVITY)  TROPONIN I (HIGH SENSITIVITY)  TROPONIN I (HIGH SENSITIVITY)    EKG EKG Interpretation  Date/Time:  Tuesday December 08 2021 08:40:10 EDT Ventricular Rate:  77 PR Interval:  155 QRS Duration: 164 QT Interval:  447 QTC Calculation: 506 R Axis:   260 Text  Interpretation: Sinus rhythm RBBB and LAFB Confirmed by Milton Ferguson [phone]) on 12/08/2021 1:57:13 PM  Radiology CT Chest W Contrast  Result Date: 12/08/2021 CLINICAL DATA:  COPD exacerbation. Shortness of breath and chest pain. EXAM: CT CHEST WITH CONTRAST TECHNIQUE: Multidetector CT imaging of the chest was performed during intravenous contrast administration. RADIATION DOSE REDUCTION: This exam was performed according to the departmental dose-optimization program which includes automated exposure control, adjustment of the mA and/or kV according to patient size and/or use of iterative reconstruction technique. CONTRAST:  137m OMNIPAQUE IOHEXOL 300 MG/ML  SOLN COMPARISON:  Radiographs 12/08/2021 and 07/16/2022. FINDINGS: Cardiovascular: No acute vascular findings are demonstrated. There is mild atherosclerosis of the aorta, great vessels  and coronary arteries. Mild central enlargement of the pulmonary arteries which may indicate pulmonary arterial hypertension. The heart size is normal. There is no pericardial effusion. Mediastinum/Nodes: There are no enlarged mediastinal, hilar or axillary lymph nodes. The thyroid gland, trachea and esophagus demonstrate no significant findings. Lungs/Pleura: No pleural effusion or pneumothorax. Minimal atelectasis at the lung bases. No confluent airspace opacity or suspicious pulmonary nodule. Upper abdomen: Diffuse low density throughout the liver consistent with steatosis. The hepatic contours are irregular, and there is relative enlargement of the left lobe suspicious for early cirrhosis. No focal hepatic lesions are identified. Small lymph nodes in the porta hepatis are likely reactive. Musculoskeletal/Chest wall: There is no chest wall mass or suspicious osseous finding. Mild thoracic spondylosis. Mild dependent edema within the chest wall. IMPRESSION: 1. No acute findings or explanation for the patient's symptoms. 2. Mild central enlargement of the pulmonary  arteries may indicate pulmonary arterial hypertension. 3. Hepatic steatosis with possible early cirrhosis. 4.  Aortic Atherosclerosis (ICD10-I70.0). Electronically Signed   By: Richardean Sale M.D.   On: 12/08/2021 10:51   DG Chest Port 1 View  Result Date: 12/08/2021 CLINICAL DATA:  Chest pain and shortness of breath EXAM: PORTABLE CHEST 1 VIEW COMPARISON:  Chest radiograph dated 07/15/2012 FINDINGS: Normal lung volumes. Right basilar patchy and left mid lung linear opacities. Similar appearance of increased interstitial opacities bilaterally. No pleural effusion or pneumothorax. Similar enlarged cardiomediastinal silhouette. The visualized skeletal structures are unremarkable. Radiodensity at the left upper quadrant is likely external to the patient. IMPRESSION: 1. Right basilar patchy opacities may reflect atelectasis, aspiration, or pneumonia. Left mid lung linear opacity, likely atelectasis. 2. Similar appearance of increased interstitial opacities bilaterally which may reflect prominent pulmonary vasculature. 3. Similar cardiomegaly. Electronically Signed   By: Darrin Nipper M.D.   On: 12/08/2021 09:11    Procedures Procedures  {Document cardiac monitor, telemetry assessment procedure when appropriate:1}  Medications Ordered in ED Medications  heparin injection 5,000 Units (has no administration in time range)  0.9 %  sodium chloride infusion (has no administration in time range)  acetaminophen (TYLENOL) tablet 650 mg (has no administration in time range)  ondansetron (ZOFRAN) injection 4 mg (has no administration in time range)  ALPRAZolam (XANAX) tablet 0.25 mg (has no administration in time range)  zolpidem (AMBIEN) tablet 5 mg (has no administration in time range)  insulin aspart (novoLOG) injection 0-6 Units (has no administration in time range)  insulin glargine-yfgn (SEMGLEE) injection 15 Units (has no administration in time range)  nitroGLYCERIN (NITROSTAT) SL tablet 0.4 mg (has no  administration in time range)  furosemide (LASIX) tablet 40 mg (has no administration in time range)  metoprolol tartrate (LOPRESSOR) tablet 50 mg (has no administration in time range)  rosuvastatin (CRESTOR) tablet 40 mg (has no administration in time range)  pantoprazole (PROTONIX) EC tablet 40 mg (has no administration in time range)  clopidogrel (PLAVIX) tablet 75 mg (has no administration in time range)  mupirocin ointment (BACTROBAN) 2 % (has no administration in time range)  Gerhardt's butt cream 1 Application (has no administration in time range)  nystatin (MYCOSTATIN/NYSTOP) topical powder 1 Application (has no administration in time range)  ipratropium-albuterol (DUONEB) 0.5-2.5 (3) MG/3ML nebulizer solution (3 mLs  Given 12/08/21 0849)  nitroGLYCERIN (NITROSTAT) SL tablet 0.4 mg (0.4 mg Sublingual Given 12/08/21 0925)  nitroGLYCERIN (NITROSTAT) SL tablet 0.4 mg (0.4 mg Sublingual Given 12/08/21 1013)  iohexol (OMNIPAQUE) 300 MG/ML solution 100 mL (100 mLs Intravenous Contrast Given 12/08/21 1033)  ED Course/ Medical Decision Making/ A&P  I spoke with Dr. Domenic Polite with cardiology and he suggested having medicine admit to rule her out and they will consult on her tomorrow.  She will be made n.p.o. tonight                         Medical Decision Making Amount and/or Complexity of Data Reviewed Labs: ordered. Radiology: ordered.  Risk Prescription drug management. Decision regarding hospitalization.   Chest pain and shortness of breath with history of coronary artery disease  {Document critical care time when appropriate:1} {Document review of labs and clinical decision tools ie heart score, Chads2Vasc2 etc:1}  {Document your independent review of radiology images, and any outside records:1} {Document your discussion with family members, caretakers, and with consultants:1} {Document social determinants of health affecting pt's care:1} {Document your decision making why or  why not admission, treatments were needed:1} Final Clinical Impression(s) / ED Diagnoses Final diagnoses:  Other forms of angina pectoris    Rx / DC Orders ED Discharge Orders     None

## 2021-12-08 NOTE — Assessment & Plan Note (Signed)
-   Currently stable, continue as needed now benzodiazepines -Resuming home medication

## 2021-12-08 NOTE — ED Notes (Signed)
Pt went to ct. Removed pt's dexcome to go to ct . Made nurse aware

## 2021-12-08 NOTE — ED Triage Notes (Signed)
Pt brought in by [facility] for c/o chest pain and sob;

## 2021-12-08 NOTE — ED Notes (Signed)
MD aware pt is having CP

## 2021-12-08 NOTE — Assessment & Plan Note (Signed)
-   Not O2 dependent, no signs of exacerbation -Monitoring closely, as needed DuoNeb bronchodilators

## 2021-12-08 NOTE — Assessment & Plan Note (Signed)
-   Continue Plavix, statins,  -Patient had a left heart catheterization in August 14, 2021 Minimum diffuse stenosis noted with 1 stent to RCA, and 2 stent to LAD, EJ EF 55-65% At the time recommendation for Plavix(allergic to aspirin), optimizing blood pressure control, LDL less than 70, tight control of DM 2

## 2021-12-08 NOTE — H&P (Signed)
History and Physical   Patient: Nicole [name]                            PCP: Celene Squibb, MD                    DOB: 20-Nov-1965            DOA: 12/08/2021 NKN:[id number]             DOS: 12/08/2021, 3:56 PM  Celene Squibb, MD  Patient coming from:   HOME  I have personally reviewed patient's medical records, in electronic medical records, including:  [place] link, and care everywhere.    Chief Complaint:   Chief Complaint  Patient presents with   Shortness of Breath    History of present illness:    Nicole [name] is a 56 year old female with extensive history of HTN, HLD, DM 2, COPD, h/o diabetic foot, left BKA depression/anxiety, morbid obesity, CAD to stent to LAD, and 1 at RCA, with a recent left heart catheterization on 08/14/2021 -on Plavix and statin, Presented with acute onset of chest pain.  Describes the pain substernal, intermittent associated with mild shortness of breath Denies any cold sweats..  Shortness of breath gets worse with exertion      ED course: Blood pressure 115/68, pulse 66, resp. rate 19, height 5' 3"  (1.6 m), weight 136.1 kg, SpO2 100 %.  CBC hemoglobin 10.6, hematocrit 33.7, WBC 9.9  BMP: WNL BUN 25, creatinine 0.83, calcium 8.2, glucose of 323 BNP 80 Troponin 15, 14,  EKG: Nonspecific finding, normal sinus rhythm, no obvious signs of ST elevation depression  CT chest: IMPRESSION: 1. No acute findings or explanation for the patient's symptoms. 2. Mild central enlargement of the pulmonary arteries may indicate pulmonary arterial hypertension. 3. Hepatic steatosis with possible early cirrhosis. 4.  Aortic Atherosclerosis  -Due to patient extensive comorbidities extensive coronary artery disease with stents Cardiology Dr. Domenic Polite was called by the ED provider, who requested patient to be admitted for observation and kept n.p.o. after midnight.  Patient will be admitted for chest pain ruling out MI    Patient Denies having:  Fever, Chills, Cough, SOB, Chest Pain, Abd pain, N/V/D, headache, dizziness, lightheadedness,  Dysuria, Joint pain, rash, open wounds  ED Course:   Blood pressure (!) 159/70, pulse 66, resp. rate 17, height 5' 3"  (1.6 m), weight 136.1 kg, SpO2 100 %. Abnormal labs;   Review of Systems: As per HPI, otherwise 10 point review of systems were negative.   ----------------------------------------------------------------------------------------------------------------------  Allergies  Allergen Reactions   Aspirin Anaphylaxis and Other (See Comments)    Throat closing    Bee Venom Anaphylaxis   Pineapple Anaphylaxis   E-Mycin [Erythromycin Base] Nausea And Vomiting    Home MEDs:  Prior to Admission medications   Medication Sig Start Date End Date Taking? Authorizing Provider  acetaminophen (TYLENOL) 500 MG tablet Take 1 tablet (500 mg total) by mouth every 6 (six) hours as needed for moderate pain. 02/05/20  Yes Amin, Ankit Chirag, MD  albuterol (PROVENTIL) (2.5 MG/3ML) 0.083% nebulizer solution Take 3 mLs (2.5 mg total) by nebulization every 6 (six) hours as needed for wheezing. 07/30/20  Yes Ailene Ards, NP  albuterol (VENTOLIN HFA) 108 (90 Base) MCG/ACT inhaler Inhale 2 puffs into the lungs every 6 (six) hours as needed for wheezing. Shortness of breath 07/30/20  Yes Ailene Ards, NP  buPROPion ER (WELLBUTRIN SR) 100 MG 12 hr tablet Take 100 mg by mouth daily.   Yes [provider]  clopidogrel (PLAVIX) 75 MG tablet Take 1 tablet (75 mg total) by mouth daily. 09/09/21  Yes Strader, Fransisco Hertz, PA-C  EPINEPHrine 0.3 mg/0.3 mL IJ SOAJ injection Inject 0.3 mg into the muscle as needed for anaphylaxis. 05/15/20  Yes Ailene Ards, NP  furosemide (LASIX) 40 MG tablet Take 1.5 tablets (60 mg total) by mouth daily. Patient taking differently: Take 40 mg by mouth daily. 08/05/21  Yes Strader, Evaro, PA-C  glipiZIDE (GLUCOTROL) 10 MG tablet Take 1 tablet (10 mg total) by mouth 2 (two)  times daily before a meal. 09/29/21  Yes Reardon, Loree Fee J, NP  insulin regular human CONCENTRATED (HUMULIN R U-500 KWIKPEN) 500 UNIT/ML KwikPen Inject 80 Units into the skin 3 (three) times daily with meals. 09/29/21  Yes Reardon, Juanetta Beets, NP  KERENDIA 10 MG TABS Take 1 tablet by mouth daily. 09/25/21  Yes [provider]  lisinopril (ZESTRIL) 10 MG tablet Take 10 mg by mouth 2 (two) times daily. 07/10/21  Yes [provider]  loperamide (IMODIUM) 2 MG capsule Take 1 capsule (2 mg total) by mouth every 6 (six) hours as needed for diarrhea or loose stools. 12/10/19  Yes Kathie Dike, MD  metoprolol tartrate (LOPRESSOR) 50 MG tablet Take 1 tablet (50 mg total) by mouth 2 (two) times daily. 07/30/20  Yes Ailene Ards, NP  Multiple Vitamins-Calcium (ONE-A-DAY WOMENS FORMULA) TABS Take 1 tablet by mouth daily.   Yes [provider]  nitroGLYCERIN (NITROSTAT) 0.4 MG SL tablet Take one tablet every 5 minutes as needed for chest pains up to 3 tablets. Patient taking differently: Place 0.4 mg under the tongue every 5 (five) minutes as needed for chest pain (every 5 minutes as needed for chest pains up to 3 tablets.). 10/03/20  Yes Fay Records, MD  Nystatin (GERHARDT'S BUTT CREAM) CREA Apply 1 Application topically 2 (two) times daily. 08/15/21  Yes Bhagat, Bhavinkumar, PA  nystatin powder Apply 1 Application topically daily as needed (irritation).   Yes [provider]  pantoprazole (PROTONIX) 40 MG tablet Take 1 tablet (40 mg total) by mouth daily. Patient taking differently: Take 40 mg by mouth at bedtime. 07/30/20  Yes Ailene Ards, NP  rosuvastatin (CRESTOR) 40 MG tablet Take 40 mg by mouth at bedtime. 04/16/21  Yes [provider]  Semaglutide, 1 MG/DOSE, 4 MG/3ML SOPN Inject 1 mg as directed once a week. 11/03/21  Yes Brita Romp, NP  Vitamin D, Ergocalciferol, (DRISDOL) 1.25 MG (50000 UNIT) CAPS capsule Take 50,000 Units by mouth every Monday. 04/16/21  Yes  [provider]  VRAYLAR 3 MG capsule Take 3 mg by mouth daily. 11/15/21  Yes [provider]  ACCU-CHEK GUIDE test strip USE 1 STRIP TO CHECK GLUCOSE 4 TIMES DAILY 03/11/21   Brita Romp, NP  blood glucose meter kit and supplies Dispense based on patient and insurance preference. Use four times daily as directed. (FOR ICD-10 E10.9, E11.9). 01/09/21   Brita Romp, NP  EASY TOUCH INSULIN SYRINGE 31G X 5/16" 1 ML MISC  09/21/19   [provider]  Insulin Pen Needle (B-D ULTRAFINE III SHORT PEN) 31G X 8 MM MISC USE AS DIRECTED 07/06/21   [name], NP  mupirocin ointment (BACTROBAN) 2 % SMARTSIG:sparingly Topical 3 Times Daily Patient not taking: Reported on 12/08/2021 09/24/21   [provider]    PRN MEDs: acetaminophen, ALPRAZolam, nitroGLYCERIN, nystatin, ondansetron (ZOFRAN) IV, zolpidem  Past Medical History:  Diagnosis Date   CAP (community acquired pneumonia)    Streptococcus 01/2011   COPD (chronic obstructive pulmonary disease) (HCC)    Coronary atherosclerosis of native coronary artery    a. Diagnosed [place] 2006 - DES RCA, reports followup cath 2009 at [facility] b. reported 2 stents in [place] in 2020. c. 07/2021: cath showing patent stents along RCA and mid-LAD with scattered 20% stenosis but no obstructive disease.   Dyslipidemia    Essential hypertension, benign    Morbid obesity ([place])    Pancreatitis    Type 2 diabetes mellitus ([place])     Past Surgical History:  Procedure Laterality Date   ABDOMINAL HERNIA REPAIR     ABDOMINAL HYSTERECTOMY     AMPUTATION Left 01/25/2020   Procedure: LEFT BELOW KNEE AMPUTATION;  Surgeon: Newt Minion, MD;  Location: [place];  Service: Orthopedics;  Laterality: Left;   CORONARY ANGIOPLASTY WITH STENT PLACEMENT  2006   KNEE SURGERY     LEFT HEART CATH AND CORONARY ANGIOGRAPHY N/A 08/14/2021   Procedure: LEFT HEART CATH AND CORONARY ANGIOGRAPHY;  Surgeon: Troy Sine, MD;  Location: [place] [facility];  Service: Cardiovascular;  Laterality: N/A;   NOSE SURGERY     Pilonidal cystectomy     TONSILLECTOMY       reports that she quit smoking about 22 months ago. Her smoking use included cigarettes. She has a 10.00 pack-year smoking history. She has never used smokeless tobacco. She reports that she does not drink alcohol and does not use drugs.   Family History  Problem Relation Age of Onset   Diabetes Mother    Heart failure Mother    Hypertension Mother    Kidney failure Mother    Ovarian cancer Mother    Asthma Son     Physical Exam:   Vitals:   12/08/21 1100 12/08/21 1330 12/08/21 1400 12/08/21 1500  BP: (!) 140/49 (!) 118/36 115/68 (!) 159/70  Pulse: 67 66    Resp:  18 19 17   SpO2: 100% 100%    Weight:      Height:       Constitutional: Morbidly obese female NAD, calm, comfortable Eyes: PERRL, lids and conjunctivae normal ENMT: Mucous membranes are moist. Posterior pharynx clear of any exudate or lesions.Normal dentition.  Neck: normal, supple, no masses, no thyromegaly Respiratory: clear to auscultation bilaterally, no wheezing, no crackles. Normal respiratory effort. No accessory muscle use.  Cardiovascular: Regular rate and rhythm, no murmurs / rubs / gallops. No extremity edema. 2+ pedal pulses. No carotid bruits.  Abdomen: no tenderness, no masses palpated. No hepatosplenomegaly. Bowel sounds positive.  Musculoskeletal: no clubbing / cyanosis. No joint deformity upper and lower extremities. Good ROM, no contractures. Normal muscle tone.  Neurologic: CN II-XII grossly intact. Sensation intact, DTR normal. Strength 5/5 in all 4.  Psychiatric: Normal judgment and insight. Alert and oriented x 3. Normal mood.  Skin: no rashes, lesions, ulcers. No induration Decubitus/ulcers:  Wounds: per nursing documentation     Labs on admission:    I have personally reviewed following labs and imaging studies  CBC: Recent Labs  Lab 12/08/21 0913  12/08/21 1435  WBC 9.9 10.5  NEUTROABS 6.5  --   HGB 10.6* 10.6*  HCT 33.7* 33.2*  MCV 98.5 97.4  PLT 181 811   Basic Metabolic Panel: Recent Labs  Lab 12/08/21 0913 12/08/21  1435  NA 141  --   K 3.9  --   CL 105  --   CO2 28  --   GLUCOSE 323*  --   BUN 25*  --   CREATININE 0.83 0.93  CALCIUM 8.2*  --    GFR: Estimated Creatinine Clearance: 90.5 mL/min (by C-G formula based on SCr of 0.93 mg/dL). Liver Function Tests: Recent Labs  Lab 12/08/21 0913  AST 26  ALT 19  ALKPHOS 79  BILITOT 0.4  PROT 6.9  ALBUMIN 3.2*    CBG: Recent Labs  Lab 12/08/21 1022  GLUCAP 280*  Urine analysis:    Component Value Date/Time   COLORURINE STRAW (A) 06/16/2021 1656   APPEARANCEUR CLEAR 06/16/2021 1656   LABSPEC 1.020 06/16/2021 1656   PHURINE 6.0 06/16/2021 1656   GLUCOSEU >=500 (A) 06/16/2021 1656   HGBUR NEGATIVE 06/16/2021 1656   BILIRUBINUR NEGATIVE 06/16/2021 1656   KETONESUR NEGATIVE 06/16/2021 1656   PROTEINUR 100 (A) 06/16/2021 1656   UROBILINOGEN 1.0 02/18/2011 2336   NITRITE NEGATIVE 06/16/2021 1656   LEUKOCYTESUR SMALL (A) 06/16/2021 1656    Last A1C:  Lab Results  Component Value Date   HGBA1C 14.3 09/29/2021     Radiologic Exams on Admission:   CT Chest W Contrast  Result Date: 12/08/2021 CLINICAL DATA:  COPD exacerbation. Shortness of breath and chest pain. EXAM: CT CHEST WITH CONTRAST TECHNIQUE: Multidetector CT imaging of the chest was performed during intravenous contrast administration. RADIATION DOSE REDUCTION: This exam was performed according to the departmental dose-optimization program which includes automated exposure control, adjustment of the mA and/or kV according to patient size and/or use of iterative reconstruction technique. CONTRAST:  152m OMNIPAQUE IOHEXOL 300 MG/ML  SOLN COMPARISON:  Radiographs 12/08/2021 and 07/16/2022. FINDINGS: Cardiovascular: No acute vascular findings are demonstrated. There is mild atherosclerosis of the  aorta, great vessels and coronary arteries. Mild central enlargement of the pulmonary arteries which may indicate pulmonary arterial hypertension. The heart size is normal. There is no pericardial effusion. Mediastinum/Nodes: There are no enlarged mediastinal, hilar or axillary lymph nodes. The thyroid gland, trachea and esophagus demonstrate no significant findings. Lungs/Pleura: No pleural effusion or pneumothorax. Minimal atelectasis at the lung bases. No confluent airspace opacity or suspicious pulmonary nodule. Upper abdomen: Diffuse low density throughout the liver consistent with steatosis. The hepatic contours are irregular, and there is relative enlargement of the left lobe suspicious for early cirrhosis. No focal hepatic lesions are identified. Small lymph nodes in the porta hepatis are likely reactive. Musculoskeletal/Chest wall: There is no chest wall mass or suspicious osseous finding. Mild thoracic spondylosis. Mild dependent edema within the chest wall. IMPRESSION: 1. No acute findings or explanation for the patient's symptoms. 2. Mild central enlargement of the pulmonary arteries may indicate pulmonary arterial hypertension. 3. Hepatic steatosis with possible early cirrhosis. 4.  Aortic Atherosclerosis (ICD10-I70.0). Electronically Signed   By: WRichardean SaleM.D.   On: 12/08/2021 10:51   DG Chest Port 1 View  Result Date: 12/08/2021 CLINICAL DATA:  Chest pain and shortness of breath EXAM: PORTABLE CHEST 1 VIEW COMPARISON:  Chest radiograph dated 07/15/2012 FINDINGS: Normal lung volumes. Right basilar patchy and left mid lung linear opacities. Similar appearance of increased interstitial opacities bilaterally. No pleural effusion or pneumothorax. Similar enlarged cardiomediastinal silhouette. The visualized skeletal structures are unremarkable. Radiodensity at the left upper quadrant is likely external to the patient. IMPRESSION: 1. Right basilar patchy opacities may reflect atelectasis,  aspiration, or pneumonia. Left mid lung linear opacity,  likely atelectasis. 2. Similar appearance of increased interstitial opacities bilaterally which may reflect prominent pulmonary vasculature. 3. Similar cardiomegaly. Electronically Signed   By: Darrin Nipper M.D.   On: 12/08/2021 09:11    EKG:   Independently reviewed.  Orders placed or performed during the hospital encounter of 12/08/21   ED EKG   ED EKG   EKG 12-Lead   EKG 12-Lead   EKG 12-Lead (at 6am)   ---------------------------------------------------------------------------------------------------------------------------------------    Assessment / Plan:   Principal Problem:   Chest pain Active Problems:   Coronary artery disease involving native coronary artery of native heart with angina pectoris (HCC)   Dyslipidemia   COPD (chronic obstructive pulmonary disease) (HCC)   HTN (hypertension), benign   TIA (transient ischemic attack)   Morbid obesity with body mass index (BMI) of 50.0 to 59.9 in adult [facility])   Anxiety and depression   Hx of BKA, left (HCC)   Uncontrolled type 2 diabetes mellitus with hyperglycemia, with long-term current use of insulin (HCC)   Assessment and Plan: * Chest pain - Chest pain with history of diffuse coronary artery disease, status post cardiac catheterization August 14, 2021, revealing diffuse multivessel coronary artery disease, 1 stent to RCA, 2 stent to LAD patent -At the time recommended medical management with Plavix and statin  -Recycling cardiac enzymes -As needed nitroglycerin -Supplemental oxygen -Continue home medication of aspirin, Plavix  -N.p.o. after midnight cardiology Dr. Domenic Polite has been consulted will follow accordingly -  Coronary artery disease involving native coronary artery of native heart with angina pectoris ([place]) - Continue Plavix, statins,  -Patient had a left heart catheterization in August 14, 2021 Minimum diffuse stenosis noted with 1 stent to RCA, and 2  stent to LAD, EJ EF 55-65% At the time recommendation for Plavix(allergic to aspirin), optimizing blood pressure control, LDL less than 70, tight control of DM 2    COPD (chronic obstructive pulmonary disease) ([place]) - Not O2 dependent, no signs of exacerbation -Monitoring closely, as needed DuoNeb bronchodilators  Dyslipidemia - Continue lovastatin  - Checking  lipid panel Lipid Panel     Component Value Date/Time   CHOL 100 08/15/2021 0123   TRIG 217 (H) 08/15/2021 0123   HDL 26 (L) 08/15/2021 0123   CHOLHDL 3.8 08/15/2021 0123   VLDL 43 (H) 08/15/2021 0123   LDLCALC 31 08/15/2021 0123   LDLCALC 65 12/25/2019 1359       Hx of BKA, left (HCC) - Stump clean, no signs of infection  Anxiety and depression - Currently stable, continue as needed now benzodiazepines -Resuming home medication  Morbid obesity with body mass index (BMI) of 50.0 to 59.9 in adult (HCC) Body mass index is 53.14 kg/m. -Discussed with patient regarding her plan to follow-up with PCP: For strict diet exercise And referral to weight loss clinic -The benefit weight loss we discussed in detail--she expressed understanding  TIA (transient ischemic attack) - Stable continue home medications lovastatin and Plavix  HTN (hypertension), benign - Currently stable resuming home medication  -Anticipating holding ACE inhibitors, as needed hydralazine -With gentle IV fluids and ACE inhibitor with holding -optimizing kidney function and protection form possible cardiac cath and dye use   Uncontrolled type 2 diabetes mellitus with hyperglycemia, with long-term current use of insulin (HCC) - Hemoglobin A1c on 09/29/2021 was 14.3 -Rechecking A1c -We will holding home med.  Regimen including glipizide and Smaglutide  -We will initiate long-acting insulin 15 units nightly -Checking blood sugar QA CHS with SSI coverage -  N.p.o. after midnight  Acute diastolic congestive heart failure (HCC) - History of diastolic  congestive heart failure -Last cardiac cath 6, 23,2023, EJF of 55%, no indication of systolic versus diastolic failure -We will holding her Lasix for today, resuming in a.m. -Gentle IV fluid hydration-mindful of preventing any fluid overload  -We will continue to monitor daily weight      Consults called: Cardiology Dr. Domenic Polite -------------------------------------------------------------------------------------------------------------------------------------------- DVT prophylaxis:  heparin injection 5,000 Units Start: 12/08/21 1430 SCDs Start: 12/08/21 1419   Code Status:   Code Status: Full Code   Admission status: Patient will be admitted as Observation, with a less than 2 midnight length of stay. Level of care: Telemetry   Family Communication:  none at bedside  (The above findings and plan of care has been discussed with patient in detail, the patient expressed understanding and agreement of above plan)  --------------------------------------------------------------------------------------------------------------------------------------------------  Disposition Plan:  Anticipated 1-2 days Status is: Observation The patient remains OBS appropriate and will d/c before 2 midnights.     ----------------------------------------------------------------------------------------------------------------------------------------------------  Time spent: > than  22  Min.   SIGNED: Deatra James, MD, FHM. [facility],  Pager (Please use [email or web address] to page to text)  If 7PM-7AM, please contact night-coverage [email or web address],  12/08/2021, 3:56 PM

## 2021-12-09 ENCOUNTER — Observation Stay (HOSPITAL_BASED_OUTPATIENT_CLINIC_OR_DEPARTMENT_OTHER): Payer: Medicare Other

## 2021-12-09 DIAGNOSIS — I5033 Acute on chronic diastolic (congestive) heart failure: Secondary | ICD-10-CM | POA: Diagnosis not present

## 2021-12-09 DIAGNOSIS — R0789 Other chest pain: Secondary | ICD-10-CM | POA: Diagnosis not present

## 2021-12-09 DIAGNOSIS — G4733 Obstructive sleep apnea (adult) (pediatric): Secondary | ICD-10-CM

## 2021-12-09 DIAGNOSIS — I10 Essential (primary) hypertension: Secondary | ICD-10-CM | POA: Diagnosis not present

## 2021-12-09 LAB — ECHOCARDIOGRAM COMPLETE
AR max vel: 2.57 cm2
AV Area VTI: 2.51 cm2
AV Area mean vel: 2.6 cm2
AV Mean grad: 7 mmHg
AV Peak grad: 12 mmHg
Ao pk vel: 1.73 m/s
Area-P 1/2: 4.68 cm2
Height: 63 in
MV VTI: 3.52 cm2
S' Lateral: 4.6 cm
Weight: 4800 oz

## 2021-12-09 LAB — BASIC METABOLIC PANEL
Anion gap: 6 (ref 5–15)
BUN: 22 mg/dL — ABNORMAL HIGH (ref 6–20)
CO2: 30 mmol/L (ref 22–32)
Calcium: 8.4 mg/dL — ABNORMAL LOW (ref 8.9–10.3)
Chloride: 101 mmol/L (ref 98–111)
Creatinine, Ser: 0.82 mg/dL (ref 0.44–1.00)
GFR, Estimated: >60 mL/min (ref >60)
Glucose, Bld: 257 mg/dL — ABNORMAL HIGH (ref 70–99)
Potassium: 3.7 mmol/L (ref 3.5–5.1)
Sodium: 137 mmol/L (ref 135–145)

## 2021-12-09 LAB — GLUCOSE, CAPILLARY
Glucose-Capillary: 236 mg/dL — ABNORMAL HIGH (ref 70–99)
Glucose-Capillary: 223 mg/dL — ABNORMAL HIGH (ref 70–99)
Glucose-Capillary: 266 mg/dL — ABNORMAL HIGH (ref 70–99)
Glucose-Capillary: 335 mg/dL — ABNORMAL HIGH (ref 70–99)
Glucose-Capillary: 268 mg/dL — ABNORMAL HIGH (ref 70–99)

## 2021-12-09 LAB — LIPID PANEL
Cholesterol: 112 mg/dL (ref 0–200)
HDL: 28 mg/dL — ABNORMAL LOW (ref >40)
LDL Cholesterol: 40 mg/dL (ref 0–99)
Total CHOL/HDL Ratio: 4 RATIO
Triglycerides: 221 mg/dL — ABNORMAL HIGH (ref <150)
VLDL: 44 mg/dL — ABNORMAL HIGH (ref 0–40)

## 2021-12-09 MED ORDER — FUROSEMIDE 10 MG/ML IJ SOLN
40.0000 mg | Freq: Two times a day (BID) | INTRAMUSCULAR | Status: DC
  Administered 2021-12-09 – 2021-12-10 (×3): 40 mg via INTRAVENOUS
  Filled 2021-12-09 (×2): qty 4

## 2021-12-09 MED ORDER — INSULIN GLARGINE-YFGN 100 UNIT/ML ~~LOC~~ SOLN
30.0000 [IU] | Freq: Every day | SUBCUTANEOUS | Status: DC
  Administered 2021-12-09: 30 [IU] via SUBCUTANEOUS
  Filled 2021-12-09 (×2): qty 0.3

## 2021-12-09 MED ORDER — MELATONIN 3 MG PO TABS
3.0000 mg | ORAL_TABLET | Freq: Every day | ORAL | Status: DC
  Administered 2021-12-09 – 2021-12-16 (×8): 3 mg via ORAL
  Filled 2021-12-09 (×8): qty 1

## 2021-12-09 MED ORDER — PERFLUTREN LIPID MICROSPHERE
1.0000 mL | INTRAVENOUS | Status: AC | PRN
  Administered 2021-12-09: 5 mL via INTRAVENOUS

## 2021-12-09 MED ORDER — LISINOPRIL 10 MG PO TABS
10.0000 mg | ORAL_TABLET | Freq: Two times a day (BID) | ORAL | Status: DC
  Administered 2021-12-09 – 2021-12-11 (×6): 10 mg via ORAL
  Filled 2021-12-09 (×6): qty 1

## 2021-12-09 MED ORDER — INSULIN ASPART 100 UNIT/ML IJ SOLN
3.0000 [IU] | Freq: Three times a day (TID) | INTRAMUSCULAR | Status: DC
  Administered 2021-12-10 – 2021-12-12 (×7): 3 [IU] via SUBCUTANEOUS

## 2021-12-09 MED ORDER — FUROSEMIDE 10 MG/ML IJ SOLN
40.0000 mg | Freq: Two times a day (BID) | INTRAMUSCULAR | Status: DC
  Filled 2021-12-09: qty 4

## 2021-12-09 NOTE — Progress Notes (Signed)
Patient's home CPAP machine plugged into red outlet, setup and ready for use with O2 running. Patient stated she could get on herself. Unit working appropriately.

## 2021-12-09 NOTE — Care Management Obs Status (Signed)
[facility] NOTIFICATION   Patient Details  Name: Nicole [name] MRN: [id number] Date of Birth: 1965/10/31   Medicare Observation Status Notification Given:  Yes    Tommy Medal 12/09/2021, 4:16 PM

## 2021-12-09 NOTE — Progress Notes (Addendum)
Pt has not rested well this shift despite administering meds for anxiety/sleep.  Pt reports no specific issues, no CP, only SHOB with exertion relieved with rest.  Pt states can't sleep with activities in the hall while door is open but did not want door shut due to claustrophobia.  Offered food, fluids, tv.  Worked with pt to open window, push curtains back so that door could be closed.  Pt states that she would prefer melatonin that she takes at home.  Pt also states that CPAP mask does not fit comfortably on her face but advised that another size was not available.  Suggested to pt that is she is inpt again tonight and family can bring in home machine, it could be used here.  Pt frequently removing purewick, placed BSC in room  Pt requesting foley cath, advised that this is not medically indicated for her condition and explained infection risks. Pt given hydralazine x 1 this shift for elevated BP with positive outcome.  Will continue to monitor.

## 2021-12-09 NOTE — TOC Progression Note (Signed)
Transition of Care [facility]) - Progression Note    Patient Details  Name: Nicole [name] MRN: [id number] Date of Birth: [birth year]  Transition of Care [facility]) CM/SW Contact  Salome Arnt, [place] Phone Number: 12/09/2021, 11:20 AM  Clinical Narrative:  Transition of Care [facility]) Screening Note   Patient Details  Name: Nicole [name] Date of Birth: Jul 24, 1965   Transition of Care [facility]) CM/SW Contact:    Salome Arnt, LCSW Phone Number: 12/09/2021, 11:20 AM    Transition of Care Department [facility]) has reviewed patient and no TOC needs have been identified at this time. We will continue to monitor patient advancement through interdisciplinary progression rounds. If new patient transition needs arise, please place a TOC consult.          Barriers to Discharge: Continued Medical Work up  Expected Discharge Plan and Services                                                 Social Determinants of Health (SDOH) Interventions    Readmission Risk Interventions     No data to display

## 2021-12-09 NOTE — Progress Notes (Signed)
PROGRESS NOTE    Nicole [name]  MOQ:[id number] DOB: 04-26-1965 DOA: 12/08/2021 PCP: Benita Stabile, MD   Brief Narrative:    Nicole [name] is a 56 year old female with extensive history of HTN, HLD, DM 2, COPD, h/o diabetic foot, left BKA depression/anxiety, morbid obesity, CAD to stent to LAD, and 1 at RCA, with a recent left heart catheterization on 08/14/2021 -on Plavix and statin.  She presented with shortness of breath and has been admitted with dyspnea on exertion concerning for acute CHF exacerbation.  She has been started on IV diuresis and 2D echocardiogram pending.  Cardiology consulted.  Assessment & Plan:   Principal Problem:   Chest pain Active Problems:   Coronary artery disease involving native coronary artery of native heart with angina pectoris (HCC)   Dyslipidemia   COPD (chronic obstructive pulmonary disease) (HCC)   HTN (hypertension), benign   TIA (transient ischemic attack)   Morbid obesity with body mass index (BMI) of 50.0 to 59.9 in adult (HCC)   Anxiety and depression   Hx of BKA, left (HCC)   Uncontrolled type 2 diabetes mellitus with hyperglycemia, with long-term current use of insulin (HCC)  Assessment and Plan:  Acute diastolic congestive heart failure (HCC) - History of diastolic congestive heart failure -Recheck 2D echocardiogram -IV Lasix 40 mg twice daily -Check strict I's and O's and daily weights -She cardiology involvement  Coronary artery disease involving native coronary artery of native heart with angina pectoris (HCC) - Continue Plavix, statins,  -Patient had a left heart catheterization in August 14, 2021 Minimum diffuse stenosis noted with 1 stent to RCA, and 2 stent to LAD, EJ EF 55-65% At the time recommendation for Plavix(allergic to aspirin), optimizing blood pressure control, LDL less than 70, tight control of DM 2       COPD (chronic obstructive pulmonary disease) (HCC) - Not O2 dependent, no signs of  exacerbation -Monitoring closely, as needed DuoNeb bronchodilators   Dyslipidemia - Continue lovastatin  -LDL 40 on lipid panel    Hx of BKA, left (HCC) - Stump clean, no signs of infection   Anxiety and depression - Currently stable, continue as needed now benzodiazepines -Resuming home medication   Morbid obesity with body mass index (BMI) of 50.0 to 59.9 in adult (HCC) Body mass index is 53.14 kg/m. -Discussed with patient regarding her plan to follow-up with PCP: For strict diet exercise And referral to weight loss clinic -The benefit weight loss we discussed in detail--she expressed understanding   TIA (transient ischemic attack) - Stable continue home medications lovastatin and Plavix   HTN (hypertension), benign - Currently stable resuming home medication  -Okay to maintain on metoprolol and lisinopril twice daily   Uncontrolled type 2 diabetes mellitus with hyperglycemia, with long-term current use of insulin (HCC) - Hemoglobin A1c 9.5% -Hold home medications -Appreciate diabetes coordinator recommendations, increase Semglee due to hyperglycemia and add mealtime NovoLog 3 units    DVT prophylaxis:Heparin Code Status: Full Family Communication: None at bedside Disposition Plan: Admit for diuresis Status is: Observation The patient will require care spanning > 2 midnights and should be moved to inpatient because: IV diuresis.   Nutritional Assessment:  The patient's BMI is: Body mass index is 53.14 kg/m.Marland Kitchen  Seen by dietician.  I agree with the assessment and plan as outlined below:    Consultants:  Cardiology  Procedures:  None  Antimicrobials:  None   Subjective: Patient seen and evaluated today with ongoing concerns of orthopnea and  dyspnea on exertion.  She continues to have distention in her belly as well as in her arms and leg.  Objective: Vitals:   12/09/21 0018 12/09/21 0506 12/09/21 0800 12/09/21 0900  BP: (!) 156/52 (!) 149/58 138/62 (!)  139/56  Pulse: 71 69 72 71  Resp: 18 20 20 20   Temp: 98.4 F (36.9 C) 98.1 F (36.7 C) 98.2 F (36.8 C) (!) 97.2 F (36.2 C)  TempSrc:  Oral Oral Oral  SpO2: 100% 100%  99%  Weight:      Height:        Intake/Output Summary (Last 24 hours) at 12/09/2021 1102 Last data filed at 12/09/2021 1026 Gross per 24 hour  Intake 391.17 ml  Output 2830 ml  Net -2438.83 ml   Filed Weights   12/08/21 0836  Weight: 136.1 kg    Examination:  General exam: Appears calm and comfortable, morbidly obese Respiratory system: Clear to auscultation. Respiratory effort normal. On Nicole [name] Cardiovascular system: S1 & S2 heard, RRR.  Gastrointestinal system: Abdomen is soft Central nervous system: Alert and awake Extremities: L BKA Skin: No significant lesions noted Psychiatry: Flat affect.    Data Reviewed: I have personally reviewed following labs and imaging studies  CBC: Recent Labs  Lab 12/08/21 0913 12/08/21 1435  WBC 9.9 10.5  NEUTROABS 6.5  --   HGB 10.6* 10.6*  HCT 33.7* 33.2*  MCV 98.5 97.4  PLT 181 681   Basic Metabolic Panel: Recent Labs  Lab 12/08/21 0913 12/08/21 1435 12/09/21 0539  NA 141  --  137  K 3.9  --  3.7  CL 105  --  101  CO2 28  --  30  GLUCOSE 323*  --  257*  BUN 25*  --  22*  CREATININE 0.83 0.93 0.82  CALCIUM 8.2*  --  8.4*   GFR: Estimated Creatinine Clearance: 102.6 mL/min (by C-G formula based on SCr of 0.82 mg/dL). Liver Function Tests: Recent Labs  Lab 12/08/21 0913  AST 26  ALT 19  ALKPHOS 79  BILITOT 0.4  PROT 6.9  ALBUMIN 3.2*   No results for input(s): "LIPASE", "AMYLASE" in the last 168 hours. No results for input(s): "AMMONIA" in the last 168 hours. Coagulation Profile: No results for input(s): "INR", "PROTIME" in the last 168 hours. Cardiac Enzymes: No results for input(s): "CKTOTAL", "CKMB", "CKMBINDEX", "TROPONINI" in the last 168 hours. BNP (last 3 results) No results for input(s): "PROBNP" in the last 8760  hours. HbA1C: Recent Labs    12/08/21 1435  HGBA1C 9.5*   CBG: Recent Labs  Lab 12/08/21 1022 12/08/21 1707 12/08/21 2139 12/08/21 2240 12/09/21 0732  GLUCAP 280* 236* 223* 219* 266*   Lipid Profile: Recent Labs    12/09/21 0539  CHOL 112  HDL 28*  LDLCALC 40  TRIG 221*  CHOLHDL 4.0   Thyroid Function Tests: No results for input(s): "TSH", "T4TOTAL", "FREET4", "T3FREE", "THYROIDAB" in the last 72 hours. Anemia Panel: No results for input(s): "VITAMINB12", "FOLATE", "FERRITIN", "TIBC", "IRON", "RETICCTPCT" in the last 72 hours. Sepsis Labs: No results for input(s): "PROCALCITON", "LATICACIDVEN" in the last 168 hours.  No results found for this or any previous visit (from the past 240 hour(s)).       Radiology Studies: CT Chest W Contrast  Result Date: 12/08/2021 CLINICAL DATA:  COPD exacerbation. Shortness of breath and chest pain. EXAM: CT CHEST WITH CONTRAST TECHNIQUE: Multidetector CT imaging of the chest was performed during intravenous contrast administration. RADIATION DOSE REDUCTION: This exam  was performed according to the departmental dose-optimization program which includes automated exposure control, adjustment of the mA and/or kV according to patient size and/or use of iterative reconstruction technique. CONTRAST:  OMNIPAQUE IOHEXOL 300 MG/ML  SOLN COMPARISON:  Radiographs 12/08/2021 and 07/16/2022. FINDINGS: Cardiovascular: No acute vascular findings are demonstrated. There is mild atherosclerosis of the aorta, great vessels and coronary arteries. Mild central enlargement of the pulmonary arteries which may indicate pulmonary arterial hypertension. The heart size is normal. There is no pericardial effusion. Mediastinum/Nodes: There are no enlarged mediastinal, hilar or axillary lymph nodes. The thyroid gland, trachea and esophagus demonstrate no significant findings. Lungs/Pleura: No pleural effusion or pneumothorax. Minimal atelectasis at the lung bases.  No confluent airspace opacity or suspicious pulmonary nodule. Upper abdomen: Diffuse low density throughout the liver consistent with steatosis. The hepatic contours are irregular, and there is relative enlargement of the left lobe suspicious for early cirrhosis. No focal hepatic lesions are identified. Small lymph nodes in the porta hepatis are likely reactive. Musculoskeletal/Chest wall: There is no chest wall mass or suspicious osseous finding. Mild thoracic spondylosis. Mild dependent edema within the chest wall. IMPRESSION: 1. No acute findings or explanation for the patient's symptoms. 2. Mild central enlargement of the pulmonary arteries may indicate pulmonary arterial hypertension. 3. Hepatic steatosis with possible early cirrhosis. 4.  Aortic Atherosclerosis (ICD10-I70.0). Electronically Signed   By: Carey Bullocks M.D.   On: 12/08/2021 10:51   DG Chest Port 1 View  Result Date: 12/08/2021 CLINICAL DATA:  Chest pain and shortness of breath EXAM: PORTABLE CHEST 1 VIEW COMPARISON:  Chest radiograph dated 07/15/2012 FINDINGS: Normal lung volumes. Right basilar patchy and left mid lung linear opacities. Similar appearance of increased interstitial opacities bilaterally. No pleural effusion or pneumothorax. Similar enlarged cardiomediastinal silhouette. The visualized skeletal structures are unremarkable. Radiodensity at the left upper quadrant is likely external to the patient. IMPRESSION: 1. Right basilar patchy opacities may reflect atelectasis, aspiration, or pneumonia. Left mid lung linear opacity, likely atelectasis. 2. Similar appearance of increased interstitial opacities bilaterally which may reflect prominent pulmonary vasculature. 3. Similar cardiomegaly. Electronically Signed   By: Agustin Cree M.D.   On: 12/08/2021 09:11        Scheduled Meds:  buPROPion ER  100 mg Oral BID   cariprazine  3 mg Oral Daily   clopidogrel  75 mg Oral Daily   Finerenone  1 tablet Oral Daily   furosemide  40  mg Intravenous BID   Gerhardt's butt cream  1 Application Topical BID   heparin  5,000 Units Subcutaneous Q8H   insulin aspart  0-15 Units Subcutaneous TID WC   insulin aspart  3 Units Subcutaneous TID WC   insulin glargine-yfgn  30 Units Subcutaneous QHS   lisinopril  10 mg Oral BID   metoprolol tartrate  50 mg Oral BID   mupirocin ointment   Topical Daily   pantoprazole  40 mg Oral QHS   rosuvastatin  40 mg Oral QHS     LOS: 0 days    Time spent: 35 minutes    Nicole Stierwalt Hoover Brunette, DO [facility]  If 7PM-7AM, please contact night-coverage [email or web address] 12/09/2021, 11:02 AM

## 2021-12-09 NOTE — Consult Note (Signed)
Cardiology Consultation   Patient ID: [name] MRN: [id number]; DOB: 06/18/1965  Admit date: 12/08/2021 Date of Consult: 12/09/2021  PCP:  Benita Stabile, MD   [facility] Providers Cardiologist:  [name], MD        Patient Profile:   Nicole [name] is a 56 y.o. female with a hx of CAD (s/p stenting in 2009, low-risk NST in 2013, reported 2 more stents in [place] in 2020 and history of false negative stress tests, cath in 07/2021 showing patent stents along the RCA and mid LAD with only mild nonobstructive disease), paroxysmal atrial fibrillation (isolated episode in 09/2019 and not started on chronic anticoagulation), HTN, HLD, PAD (s/p L BKA), COPD and Type 2 DM who is being seen 12/09/2021 for the evaluation of chest pain at the request of Dr. Sherryll Burger.  History of Present Illness:   Nicole [name] was last examined by myself in 08/2021 following her recent cardiac catheterization the prior month which had shown patent stents along the RCA and LAD with only mild nonobstructive disease elsewhere and normal EF of 55% with LVEDP at 23 mmHg. She was continued on Plavix given her ASA allergy. Was not started on Imdur as previously intolerant to this. At the time of follow-up, she still reported having episodic chest discomfort but this was typically worse with positional changes and overall felt to be atypical for a cardiac etiology, especially in the setting of her recent reassuring catheterization.  She presented to [facility] ED on 12/08/2021 for evaluation of worsening chest pain and shortness of breath. In talking with the patient today, she reports her main issue has been worsening shortness of breath which acutely started yesterday morning. She has noticed worsening lower extremity edema and abdominal distention over the past week. States she is unable to weigh herself regularly due to her BKA. Says her chest discomfort was occurring yesterday when she could not take a deep  breath. Was not associated with exertion. She does report baseline orthopnea and PND. Uses her CPAP at home.  Initial labs showed WBC 9.9, Hgb 10.6 (baseline 11 - 12), platelets 181, Na+ 141, K+ 3.9, glucose 323 and creatinine 0.83. BNP normal at 80. Initial and repeat Hs Troponin values negative at 15, 14, 13 and 12. Hgb A1c 9.5. FLP shows total cholesterol of 112, triglycerides 221, HDL 28 and LDL 40. CXR showed right basilar patchy opacities which may reflect atelectasis, aspiration or PNA along with cardiomegaly. CT Chest showed no acute findings but was noted to have mild central enlargement of the pulmonary arteries which may indicate pulmonary arterial hypertension along with hepatic steatosis and aortic atherosclerosis.   Past Medical History:  Diagnosis Date   CAP (community acquired pneumonia)    Streptococcus 01/2011   COPD (chronic obstructive pulmonary disease) (HCC)    Coronary atherosclerosis of native coronary artery    a. Diagnosed [place] 2006 - DES RCA, reports followup cath 2009 at [facility] b. reported 2 stents in [place] in 2020. c. 07/2021: cath showing patent stents along RCA and mid-LAD with scattered 20% stenosis but no obstructive disease.   Dyslipidemia    Essential hypertension, benign    Morbid obesity (HCC)    Pancreatitis    Type 2 diabetes mellitus (HCC)     Past Surgical History:  Procedure Laterality Date   ABDOMINAL HERNIA REPAIR     ABDOMINAL HYSTERECTOMY     AMPUTATION Left 01/25/2020   Procedure: LEFT BELOW KNEE AMPUTATION;  Surgeon: Lajoyce Corners,  Illene Regulus, MD;  Location: [facility];  Service: Orthopedics;  Laterality: Left;   CORONARY ANGIOPLASTY WITH STENT PLACEMENT  2006   KNEE SURGERY     LEFT HEART CATH AND CORONARY ANGIOGRAPHY N/A 08/14/2021   Procedure: LEFT HEART CATH AND CORONARY ANGIOGRAPHY;  Surgeon: Troy Sine, MD;  Location: [facility];  Service: Cardiovascular;  Laterality: N/A;   NOSE SURGERY     Pilonidal cystectomy      TONSILLECTOMY       Home Medications:  Prior to Admission medications   Medication Sig Start Date End Date Taking? Authorizing Provider  acetaminophen (TYLENOL) 500 MG tablet Take 1 tablet (500 mg total) by mouth every 6 (six) hours as needed for moderate pain. 02/05/20  Yes Amin, Ankit Chirag, MD  albuterol (PROVENTIL) (2.5 MG/3ML) 0.083% nebulizer solution Take 3 mLs (2.5 mg total) by nebulization every 6 (six) hours as needed for wheezing. 07/30/20  Yes Ailene Ards, NP  albuterol (VENTOLIN HFA) 108 (90 Base) MCG/ACT inhaler Inhale 2 puffs into the lungs every 6 (six) hours as needed for wheezing. Shortness of breath 07/30/20  Yes Ailene Ards, NP  buPROPion ER [facility] SR) 100 MG 12 hr tablet Take 100 mg by mouth daily.   Yes [provider]  clopidogrel (PLAVIX) 75 MG tablet Take 1 tablet (75 mg total) by mouth daily. 09/09/21  Yes Kimbria Camposano, Fransisco Hertz, PA-C  EPINEPHrine 0.3 mg/0.3 mL IJ SOAJ injection Inject 0.3 mg into the muscle as needed for anaphylaxis. 05/15/20  Yes Ailene Ards, NP  furosemide (LASIX) 40 MG tablet Take 1.5 tablets (60 mg total) by mouth daily. Patient taking differently: Take 40 mg by mouth daily. 08/05/21  Yes Orville Widmann, Drain, PA-C  glipiZIDE (GLUCOTROL) 10 MG tablet Take 1 tablet (10 mg total) by mouth 2 (two) times daily before a meal. 09/29/21  Yes Reardon, Loree Fee J, NP  insulin regular human CONCENTRATED (HUMULIN R U-500 KWIKPEN) 500 UNIT/ML KwikPen Inject 80 Units into the skin 3 (three) times daily with meals. 09/29/21  Yes Reardon, Juanetta Beets, NP  KERENDIA 10 MG TABS Take 1 tablet by mouth daily. 09/25/21  Yes [provider]  lisinopril (ZESTRIL) 10 MG tablet Take 10 mg by mouth 2 (two) times daily. 07/10/21  Yes [provider]  loperamide (IMODIUM) 2 MG capsule Take 1 capsule (2 mg total) by mouth every 6 (six) hours as needed for diarrhea or loose stools. 12/10/19  Yes Kathie Dike, MD  metoprolol tartrate (LOPRESSOR) 50 MG tablet  Take 1 tablet (50 mg total) by mouth 2 (two) times daily. 07/30/20  Yes Ailene Ards, NP  Multiple Vitamins-Calcium (ONE-A-DAY WOMENS FORMULA) TABS Take 1 tablet by mouth daily.   Yes [provider]  nitroGLYCERIN (NITROSTAT) 0.4 MG SL tablet Take one tablet every 5 minutes as needed for chest pains up to 3 tablets. Patient taking differently: Place 0.4 mg under the tongue every 5 (five) minutes as needed for chest pain (every 5 minutes as needed for chest pains up to 3 tablets.). 10/03/20  Yes Fay Records, MD  Nystatin (GERHARDT'S BUTT CREAM) CREA Apply 1 Application topically 2 (two) times daily. 08/15/21  Yes Bhagat, Bhavinkumar, PA  nystatin powder Apply 1 Application topically daily as needed (irritation).   Yes [provider]  pantoprazole (PROTONIX) 40 MG tablet Take 1 tablet (40 mg total) by mouth daily. Patient taking differently: Take 40 mg by mouth at bedtime. 07/30/20  Yes Ailene Ards,  NP  rosuvastatin (CRESTOR) 40 MG tablet Take 40 mg by mouth at bedtime. 04/16/21  Yes [provider]  Semaglutide, 1 MG/DOSE, 4 MG/3ML SOPN Inject 1 mg as directed once a week. 11/03/21  Yes Brita Romp, NP  Vitamin D, Ergocalciferol, (DRISDOL) 1.25 MG (50000 UNIT) CAPS capsule Take 50,000 Units by mouth every Monday. 04/16/21  Yes [provider]  VRAYLAR 3 MG capsule Take 3 mg by mouth daily. 11/15/21  Yes [provider]  ACCU-CHEK GUIDE test strip USE 1 STRIP TO CHECK GLUCOSE 4 TIMES DAILY 03/11/21   Brita Romp, NP  blood glucose meter kit and supplies Dispense based on patient and insurance preference. Use four times daily as directed. (FOR ICD-10 E10.9, E11.9). 01/09/21   Brita Romp, NP  EASY TOUCH INSULIN SYRINGE 31G X 5/16" 1 ML MISC  09/21/19   [provider]  Insulin Pen Needle (B-D ULTRAFINE III SHORT PEN) 31G X 8 MM MISC USE AS DIRECTED 07/06/21   [name], NP  mupirocin ointment (BACTROBAN) 2 % SMARTSIG:sparingly  Topical 3 Times Daily Patient not taking: Reported on 12/08/2021 09/24/21   [provider]    Inpatient Medications: Scheduled Meds:  buPROPion ER  100 mg Oral BID   cariprazine  3 mg Oral Daily   clopidogrel  75 mg Oral Daily   Finerenone  1 tablet Oral Daily   furosemide  40 mg Intravenous Q12H   Gerhardt's butt cream  1 Application Topical BID   heparin  5,000 Units Subcutaneous Q8H   insulin aspart  0-15 Units Subcutaneous TID WC   insulin glargine-yfgn  20 Units Subcutaneous QHS   metoprolol tartrate  50 mg Oral BID   mupirocin ointment   Topical Daily   pantoprazole  40 mg Oral QHS   rosuvastatin  40 mg Oral QHS   Continuous Infusions:  PRN Meds: acetaminophen, ALPRAZolam, hydrALAZINE, nitroGLYCERIN, nystatin, ondansetron (ZOFRAN) IV, zolpidem  Allergies:    Allergies  Allergen Reactions   Aspirin Anaphylaxis and Other (See Comments)    Throat closing    Bee Venom Anaphylaxis   Pineapple Anaphylaxis   E-Mycin [Erythromycin Base] Nausea And Vomiting    Social History:   Social History   Socioeconomic History   Marital status: Divorced    Spouse name: Not on file   Number of children: 3   Years of education: Not on file   Highest education level: Not on file  Occupational History   Occupation: Home health aide    Employer: [employer]  Tobacco Use   Smoking status: Former    Packs/day: 0.50    Years: 20.00    Total pack years: 10.00    Types: Cigarettes    Quit date: 01/22/2020    Years since quitting: 1.8   Smokeless tobacco: Never  Vaping Use   Vaping Use: Never used  Substance and Sexual Activity   Alcohol use: No   Drug use: No   Sexual activity: Yes    Birth control/protection: Surgical  Other Topics Concern   Not on file  Social History Narrative   Not on file   Social Determinants of Health   Financial Resource Strain: Not on file  Food Insecurity: Not on file  Transportation Needs: Not on file  Physical  Activity: Not on file  Stress: Not on file  Social Connections: Not on file  Intimate Partner Violence: Not on file    Family History:    Family  History  Problem Relation Age of Onset   Diabetes Mother    Heart failure Mother    Hypertension Mother    Kidney failure Mother    Ovarian cancer Mother    Asthma Son      ROS:  Please see the history of present illness.   All other ROS reviewed and negative.     Physical Exam/Data:   Vitals:   12/09/21 0018 12/09/21 0506 12/09/21 0800 12/09/21 0900  BP: (!) 156/52 (!) 149/58 138/62 (!) 139/56  Pulse: 71 69 72 71  Resp: _0 Temp: 98.4 F (36.9 C) 98.1 F (36.7 C) 98.2 F (36.8 C) (!) 97.2 F (36.2 C)  TempSrc:  Oral Oral Oral  SpO2: 100% 100%  99%  Weight:      Height:        Intake/Output Summary (Last 24 hours) at 12/09/2021 0939 Last data filed at 12/09/2021 0504 Gross per 24 hour  Intake 391.17 ml  Output 2050 ml  Net -1658.83 ml      12/08/2021    8:36 AM 11/03/2021   10:31 AM 09/29/2021   10:30 AM  Last 3 Weights  Weight (lbs) 300 lb 295 lb 308 lb  Weight (kg) 136.079 kg 133.811 kg 139.708 kg     Body mass index is 53.14 kg/m.  General: Pleasant, obese female appearing in no acute distress. HEENT: normal Neck: JVD difficult to assess secondary to body habitus.  Vascular: No carotid bruits; Distal pulses 2+ bilaterally Cardiac:  normal S1, S2; RRR; no murmur Lungs: decreased breath sounds along bases bilaterally.  Abd: soft, nontender, no hepatomegaly  Ext: 1+ pitting edema along RLE.  Musculoskeletal:  Left BKA.  Skin: warm and dry  Neuro:  CNs 2-12 intact, no focal abnormalities noted Psych:  Normal affect   EKG:  The EKG was personally reviewed and demonstrates: NSR, HR 77 with RBBB and LAFB.   Telemetry:  Telemetry was personally reviewed and demonstrates: NSR, HR in 60's to 70's with occasional PVC's.   Relevant CV Studies:  Echocardiogram: 09/2019   LHC: 07/2021 1st Diag  lesion is 20% stenosed.   Prox LAD lesion is 20% stenosed.   Mid LAD-1 lesion is 20% stenosed.   Dist RCA lesion is 30% stenosed.   Prox RCA-2 lesion is 30% stenosed.   Previously placed Prox RCA-1 stent of unknown type is  widely patent.   Previously placed Mid LAD-2 stent of unknown type is  widely patent.   The left ventricular systolic function is normal.   LV end diastolic pressure is mildly elevated.   The left ventricular ejection fraction is 55-65% by visual estimate.   Mild nonobstructive CAD with 20% stenosis in a very proximal diagonal (optional diagonal) vessel with 20% segmental proximal LAD stenosis with widely patent previously placed mid LAD stent.  The left circumflex coronary artery is angiographically normal.  The previously placed stent in the right coronary artery is widely patent and there is mild 30% mid and distal both stenoses.   Normal LV function with EF at 55%.  LVEDP 23 mmHg.   Minx closure device for right groin hemostasis.   RECOMMENDATION: Continue her prior Plavix Rx with ASA allergy.  Patient was hypertensive during the procedure and received 20 mg of hydralazine postprocedure.  Will need optimization of blood pressure control with target blood pressure less than 130/80.  Aggressive continued lipid-lowering therapy with target LDL less than 70.  Patient has poorly controlled diabetes mellitus  which needs to be more optimally treated.  Due to the late hour of the procedure and the patient's glucose greater than 400 today, we will keep patient overnight for observation with plans for optimization of therapy and discharge tomorrow.  Laboratory Data:  High Sensitivity Troponin:   Recent Labs  Lab 12/08/21 0913 12/08/21 1105 12/08/21 1435 12/08/21 1644  TROPONINIHS _0 Chemistry Recent Labs  Lab 12/08/21 0913 12/08/21 1435 12/09/21 0539  NA 141  --  137  K 3.9  --  3.7  CL 105  --  101  CO2 28  --  30  GLUCOSE 323*  --  257*  BUN 25*   --  22*  CREATININE 0.83 0.93 0.82  CALCIUM 8.2*  --  8.4*  GFRNONAA >60 >60 >60  ANIONGAP 8  --  6    Recent Labs  Lab 12/08/21 0913  PROT 6.9  ALBUMIN 3.2*  AST 26  ALT 19  ALKPHOS 79  BILITOT 0.4   Lipids  Recent Labs  Lab 12/09/21 0539  CHOL 112  TRIG 221*  HDL 28*  LDLCALC 40  CHOLHDL 4.0    Hematology Recent Labs  Lab 12/08/21 0913 12/08/21 1435  WBC 9.9 10.5  RBC 3.42* 3.41*  HGB 10.6* 10.6*  HCT 33.7* 33.2*  MCV 98.5 97.4  MCH 31.0 31.1  MCHC 31.5 31.9  RDW 14.2 14.3  PLT 181 190   Thyroid No results for input(s): "TSH", "FREET4" in the last 168 hours.  BNP Recent Labs  Lab 12/08/21 0916  BNP 80.0    DDimer No results for input(s): "DDIMER" in the last 168 hours.   Radiology/Studies:  CT Chest W Contrast  Result Date: 12/08/2021 CLINICAL DATA:  COPD exacerbation. Shortness of breath and chest pain. EXAM: CT CHEST WITH CONTRAST TECHNIQUE: Multidetector CT imaging of the chest was performed during intravenous contrast administration. RADIATION DOSE REDUCTION: This exam was performed according to the departmental dose-optimization program which includes automated exposure control, adjustment of the mA and/or kV according to patient size and/or use of iterative reconstruction technique. CONTRAST:  117m OMNIPAQUE IOHEXOL 300 MG/ML  SOLN COMPARISON:  Radiographs 12/08/2021 and 07/16/2022. FINDINGS: Cardiovascular: No acute vascular findings are demonstrated. There is mild atherosclerosis of the aorta, great vessels and coronary arteries. Mild central enlargement of the pulmonary arteries which may indicate pulmonary arterial hypertension. The heart size is normal. There is no pericardial effusion. Mediastinum/Nodes: There are no enlarged mediastinal, hilar or axillary lymph nodes. The thyroid gland, trachea and esophagus demonstrate no significant findings. Lungs/Pleura: No pleural effusion or pneumothorax. Minimal atelectasis at the lung bases. No confluent  airspace opacity or suspicious pulmonary nodule. Upper abdomen: Diffuse low density throughout the liver consistent with steatosis. The hepatic contours are irregular, and there is relative enlargement of the left lobe suspicious for early cirrhosis. No focal hepatic lesions are identified. Small lymph nodes in the porta hepatis are likely reactive. Musculoskeletal/Chest wall: There is no chest wall mass or suspicious osseous finding. Mild thoracic spondylosis. Mild dependent edema within the chest wall. IMPRESSION: 1. No acute findings or explanation for the patient's symptoms. 2. Mild central enlargement of the pulmonary arteries may indicate pulmonary arterial hypertension. 3. Hepatic steatosis with possible early cirrhosis. 4.  Aortic Atherosclerosis (ICD10-I70.0). Electronically Signed   By: WRichardean SaleM.D.   On: 12/08/2021 10:51   DG Chest Port 1 View  Result Date: 12/08/2021 CLINICAL DATA:  Chest pain and shortness of breath  EXAM: PORTABLE CHEST 1 VIEW COMPARISON:  Chest radiograph dated 07/15/2012 FINDINGS: Normal lung volumes. Right basilar patchy and left mid lung linear opacities. Similar appearance of increased interstitial opacities bilaterally. No pleural effusion or pneumothorax. Similar enlarged cardiomediastinal silhouette. The visualized skeletal structures are unremarkable. Radiodensity at the left upper quadrant is likely external to the patient. IMPRESSION: 1. Right basilar patchy opacities may reflect atelectasis, aspiration, or pneumonia. Left mid lung linear opacity, likely atelectasis. 2. Similar appearance of increased interstitial opacities bilaterally which may reflect prominent pulmonary vasculature. 3. Similar cardiomegaly. Electronically Signed   By: Darrin Nipper M.D.   On: 12/08/2021 09:11     Assessment and Plan:   1. Chest Pain/Dyspnea on Exertion - Reports her main symptom yesterday was acutely worsening dyspnea and she did have chest discomfort when trying to take a  deep breath. She has noticed abdominal distention and lower extremity edema. While BNP was normal at 80, this might not be accurate given her body habitus. - She reports compliance with Lasix 40 mg daily at home but says she only urinates for 1 to 2 hours after taking this. She may benefit from Torsemide at the time of discharge for improved bioavailability given her body habitus. She is scheduled to receive IV Lasix 40 mg BID starting today and will adjust the timing of this as it was initially scheduled to start at 1800. Follow I&O's along with daily weights. An echocardiogram is pending today for reassessment of any structural abnormalities. She is anxious to go home so would see how she responds to IV diuretics today to see if she will require additional IV Lasix tomorrow or can switch back to PO diuretics then. Given that her chest pain was mostly pleuritic and no association with exertion, would not anticipate further ischemic testing at this time given her recent reassuring catheterization.   2. CAD - She has a history of stenting to the RCA and LAD and given her history of reported false-negative stress tests, she recently underwent a repeat cardiac catheterization in 07/2021 as outlined above which showed patent stents with otherwise mild nonobstructive disease.  - Remains on Plavix 75m daily (allergic to ASA), Lopressor 551mBID and Crestor 4013maily. Previously intolerant to Imdur but could consider low-dose or Amlodipine for anti-anginal benefit if needed in the future.    3. HTN - Her BP was initially elevated but this improved, at 139/56 on most recent check. She has been continued on Lopressor 74m30mD and can resume PTA Lisinopril 10mg76m.   4. HLD - FLP this admission shows total cholesterol of 112, triglycerides 221, HDL 28 and LDL 40. She has been continued on Crestor 40mg 10my.   5. IDDM - Hgb A1c elevated to 9.5 this admission. Management per the admitting team.    For  questions or updates, please contact [facility] consult [email or web address] for contact info under    Signed, BrittaErma Heritage  12/09/2021 9:39 AM

## 2021-12-09 NOTE — Progress Notes (Signed)
0600 EKG performed on patient.

## 2021-12-09 NOTE — Progress Notes (Signed)
[facility] Pharmacist reports that Finerenone tablet must be brought in from home and is not stocked here, Primary Rn notified

## 2021-12-09 NOTE — Inpatient Diabetes Management (Signed)
Inpatient Diabetes Program Recommendations  AACE/ADA: New Consensus Statement on Inpatient Glycemic Control (2015)  Target Ranges:  Prepandial:   less than 140 mg/dL      Peak postprandial:   less than 180 mg/dL (1-2 hours)      Critically ill patients:  140 - 180 mg/dL   Lab Results  Component Value Date   GLUCAP 266 (H) 12/09/2021   HGBA1C 9.5 (H) 12/08/2021    Review of Glycemic Control  Latest Reference Range & Units 12/08/21 10:22 12/08/21 17:07 12/08/21 21:39 12/08/21 22:40 12/09/21 07:32  Glucose-Capillary 70 - 99 mg/dL 280 (H) 236 (H) 223 (H) 219 (H) 266 (H)   Diabetes history: DM 2 Outpatient Diabetes medications: Glipizide 10 mg bid, Concentrated Humulin U-500 80 units tid, Ozempic 1 mg Weekly Current orders for Inpatient glycemic control:  Semglee 20 units Daily Novolog 0-15 units tid  Inpatient Diabetes Program Recommendations:    -  Increase Semglee to 30 units -  Add Novolog 3 units tid meal coverage if eating >50% of meals -  Add Novolog hs scale  Thanks,  Tama Headings RN, MSN, BC-ADM Inpatient Diabetes Coordinator Team Pager [phone] (8a-5p)

## 2021-12-09 NOTE — Progress Notes (Signed)
Patient stated that the hospital CPAP did not work out well for her last night. She is going to try and get someone to bring hers from home if she has to stay another night. Told her RT would help get her unit setup tonight.

## 2021-12-10 DIAGNOSIS — J449 Chronic obstructive pulmonary disease, unspecified: Secondary | ICD-10-CM | POA: Diagnosis not present

## 2021-12-10 DIAGNOSIS — I480 Paroxysmal atrial fibrillation: Secondary | ICD-10-CM | POA: Diagnosis not present

## 2021-12-10 DIAGNOSIS — Z955 Presence of coronary angioplasty implant and graft: Secondary | ICD-10-CM | POA: Diagnosis not present

## 2021-12-10 DIAGNOSIS — I130 Hypertensive heart and chronic kidney disease with heart failure and stage 1 through stage 4 chronic kidney disease, or unspecified chronic kidney disease: Secondary | ICD-10-CM | POA: Diagnosis present

## 2021-12-10 DIAGNOSIS — I5033 Acute on chronic diastolic (congestive) heart failure: Secondary | ICD-10-CM | POA: Diagnosis not present

## 2021-12-10 DIAGNOSIS — I5031 Acute diastolic (congestive) heart failure: Secondary | ICD-10-CM | POA: Diagnosis present

## 2021-12-10 DIAGNOSIS — F419 Anxiety disorder, unspecified: Secondary | ICD-10-CM | POA: Diagnosis present

## 2021-12-10 DIAGNOSIS — R0789 Other chest pain: Secondary | ICD-10-CM | POA: Diagnosis not present

## 2021-12-10 DIAGNOSIS — I10 Essential (primary) hypertension: Secondary | ICD-10-CM | POA: Diagnosis not present

## 2021-12-10 DIAGNOSIS — N1831 Chronic kidney disease, stage 3a: Secondary | ICD-10-CM | POA: Diagnosis present

## 2021-12-10 DIAGNOSIS — E785 Hyperlipidemia, unspecified: Secondary | ICD-10-CM | POA: Diagnosis not present

## 2021-12-10 DIAGNOSIS — Z6841 Body Mass Index (BMI) 40.0 and over, adult: Secondary | ICD-10-CM | POA: Diagnosis not present

## 2021-12-10 DIAGNOSIS — I2510 Atherosclerotic heart disease of native coronary artery without angina pectoris: Secondary | ICD-10-CM | POA: Diagnosis not present

## 2021-12-10 DIAGNOSIS — Z89512 Acquired absence of left leg below knee: Secondary | ICD-10-CM | POA: Diagnosis not present

## 2021-12-10 DIAGNOSIS — F32A Depression, unspecified: Secondary | ICD-10-CM | POA: Diagnosis not present

## 2021-12-10 DIAGNOSIS — D631 Anemia in chronic kidney disease: Secondary | ICD-10-CM | POA: Diagnosis present

## 2021-12-10 DIAGNOSIS — E1165 Type 2 diabetes mellitus with hyperglycemia: Secondary | ICD-10-CM | POA: Diagnosis not present

## 2021-12-10 DIAGNOSIS — Z8041 Family history of malignant neoplasm of ovary: Secondary | ICD-10-CM | POA: Diagnosis not present

## 2021-12-10 DIAGNOSIS — Z79899 Other long term (current) drug therapy: Secondary | ICD-10-CM | POA: Diagnosis not present

## 2021-12-10 DIAGNOSIS — G4733 Obstructive sleep apnea (adult) (pediatric): Secondary | ICD-10-CM | POA: Diagnosis not present

## 2021-12-10 DIAGNOSIS — G459 Transient cerebral ischemic attack, unspecified: Secondary | ICD-10-CM | POA: Diagnosis not present

## 2021-12-10 DIAGNOSIS — Z794 Long term (current) use of insulin: Secondary | ICD-10-CM | POA: Diagnosis not present

## 2021-12-10 DIAGNOSIS — Z825 Family history of asthma and other chronic lower respiratory diseases: Secondary | ICD-10-CM | POA: Diagnosis not present

## 2021-12-10 DIAGNOSIS — R0902 Hypoxemia: Secondary | ICD-10-CM | POA: Diagnosis present

## 2021-12-10 DIAGNOSIS — I25118 Atherosclerotic heart disease of native coronary artery with other forms of angina pectoris: Secondary | ICD-10-CM | POA: Diagnosis present

## 2021-12-10 DIAGNOSIS — E1122 Type 2 diabetes mellitus with diabetic chronic kidney disease: Secondary | ICD-10-CM | POA: Diagnosis present

## 2021-12-10 DIAGNOSIS — I472 Ventricular tachycardia, unspecified: Secondary | ICD-10-CM | POA: Diagnosis present

## 2021-12-10 DIAGNOSIS — Z8249 Family history of ischemic heart disease and other diseases of the circulatory system: Secondary | ICD-10-CM | POA: Diagnosis not present

## 2021-12-10 LAB — BASIC METABOLIC PANEL
Anion gap: 9 (ref 5–15)
BUN: 25 mg/dL — ABNORMAL HIGH (ref 6–20)
CO2: 31 mmol/L (ref 22–32)
Calcium: 8.8 mg/dL — ABNORMAL LOW (ref 8.9–10.3)
Chloride: 98 mmol/L (ref 98–111)
Creatinine, Ser: 0.95 mg/dL (ref 0.44–1.00)
GFR, Estimated: >60 mL/min (ref >60)
Glucose, Bld: 287 mg/dL — ABNORMAL HIGH (ref 70–99)
Potassium: 3.6 mmol/L (ref 3.5–5.1)
Sodium: 138 mmol/L (ref 135–145)

## 2021-12-10 LAB — GLUCOSE, CAPILLARY
Glucose-Capillary: 305 mg/dL — ABNORMAL HIGH (ref 70–99)
Glucose-Capillary: 268 mg/dL — ABNORMAL HIGH (ref 70–99)
Glucose-Capillary: 314 mg/dL — ABNORMAL HIGH (ref 70–99)
Glucose-Capillary: 254 mg/dL — ABNORMAL HIGH (ref 70–99)
Glucose-Capillary: 280 mg/dL — ABNORMAL HIGH (ref 70–99)

## 2021-12-10 LAB — MAGNESIUM: Magnesium: 1.4 mg/dL — ABNORMAL LOW (ref 1.7–2.4)

## 2021-12-10 MED ORDER — ALBUTEROL SULFATE HFA 108 (90 BASE) MCG/ACT IN AERS: 2.0000 | INHALATION_SPRAY | Freq: Four times a day (QID) | RESPIRATORY_TRACT | Status: DC | PRN

## 2021-12-10 MED ORDER — INSULIN ASPART 100 UNIT/ML IJ SOLN
0.0000 [IU] | Freq: Three times a day (TID) | INTRAMUSCULAR | Status: DC
  Administered 2021-12-10: 11 [IU] via SUBCUTANEOUS
  Administered 2021-12-10 – 2021-12-11 (×2): 15 [IU] via SUBCUTANEOUS
  Administered 2021-12-11: 11 [IU] via SUBCUTANEOUS
  Administered 2021-12-11 – 2021-12-12 (×3): 15 [IU] via SUBCUTANEOUS
  Administered 2021-12-12 – 2021-12-13 (×2): 20 [IU] via SUBCUTANEOUS
  Administered 2021-12-13: 11 [IU] via SUBCUTANEOUS
  Administered 2021-12-13: 15 [IU] via SUBCUTANEOUS
  Administered 2021-12-14: 20 [IU] via SUBCUTANEOUS
  Administered 2021-12-14: 15 [IU] via SUBCUTANEOUS
  Administered 2021-12-14: 7 [IU] via SUBCUTANEOUS
  Administered 2021-12-15: 20 [IU] via SUBCUTANEOUS
  Administered 2021-12-15: 4 [IU] via SUBCUTANEOUS
  Administered 2021-12-15: 20 [IU] via SUBCUTANEOUS
  Administered 2021-12-16: 11 [IU] via SUBCUTANEOUS
  Administered 2021-12-16: 7 [IU] via SUBCUTANEOUS
  Administered 2021-12-17: 20 [IU] via SUBCUTANEOUS
  Administered 2021-12-17: 15 [IU] via SUBCUTANEOUS

## 2021-12-10 MED ORDER — INSULIN ASPART 100 UNIT/ML IJ SOLN
0.0000 [IU] | Freq: Every day | INTRAMUSCULAR | Status: DC
  Administered 2021-12-10: 3 [IU] via SUBCUTANEOUS
  Administered 2021-12-11 – 2021-12-14 (×4): 4 [IU] via SUBCUTANEOUS
  Administered 2021-12-15 – 2021-12-16 (×2): 2 [IU] via SUBCUTANEOUS

## 2021-12-10 MED ORDER — ALBUTEROL SULFATE (2.5 MG/3ML) 0.083% IN NEBU: 2.5000 mg | INHALATION_SOLUTION | Freq: Four times a day (QID) | RESPIRATORY_TRACT | Status: DC | PRN

## 2021-12-10 MED ORDER — MAGNESIUM SULFATE 2 GM/50ML IV SOLN
2.0000 g | Freq: Once | INTRAVENOUS | Status: AC
  Administered 2021-12-10: 2 g via INTRAVENOUS
  Filled 2021-12-10: qty 50

## 2021-12-10 MED ORDER — POTASSIUM CHLORIDE CRYS ER 20 MEQ PO TBCR
40.0000 meq | EXTENDED_RELEASE_TABLET | Freq: Once | ORAL | Status: AC
  Administered 2021-12-10: 40 meq via ORAL
  Filled 2021-12-10: qty 2

## 2021-12-10 MED ORDER — INSULIN GLARGINE-YFGN 100 UNIT/ML ~~LOC~~ SOLN
35.0000 [IU] | Freq: Every day | SUBCUTANEOUS | Status: DC
  Administered 2021-12-10: 35 [IU] via SUBCUTANEOUS
  Filled 2021-12-10 (×2): qty 0.35

## 2021-12-10 MED ORDER — FUROSEMIDE 10 MG/ML IJ SOLN
80.0000 mg | Freq: Two times a day (BID) | INTRAMUSCULAR | Status: DC
  Administered 2021-12-10 – 2021-12-12 (×4): 80 mg via INTRAVENOUS
  Filled 2021-12-10 (×4): qty 8

## 2021-12-10 NOTE — Progress Notes (Signed)
Rounding Note    Patient Name: Nicole [name] Date of Encounter: 12/10/2021  [place] Cardiologist: Dorris Carnes, MD   Subjective   Breathing improved but still bothering her in the evening hours. Uses her nebulizer as needed at home but not currently ordered. No chest pain or palpitations. Reports Definity with her echocardiogram yesterday caused muscle aches.   Inpatient Medications    Scheduled Meds:  buPROPion ER  100 mg Oral BID   cariprazine  3 mg Oral Daily   clopidogrel  75 mg Oral Daily   Finerenone  1 tablet Oral Daily   furosemide  40 mg Intravenous BID   Gerhardt's butt cream  1 Application Topical BID   heparin  5,000 Units Subcutaneous Q8H   insulin aspart  0-20 Units Subcutaneous TID WC   insulin aspart  0-5 Units Subcutaneous QHS   insulin aspart  3 Units Subcutaneous TID WC   insulin glargine-yfgn  30 Units Subcutaneous QHS   lisinopril  10 mg Oral BID   melatonin  3 mg Oral QHS   metoprolol tartrate  50 mg Oral BID   mupirocin ointment   Topical Daily   pantoprazole  40 mg Oral QHS   rosuvastatin  40 mg Oral QHS   Continuous Infusions:  magnesium sulfate bolus IVPB     PRN Meds: acetaminophen, ALPRAZolam, hydrALAZINE, nitroGLYCERIN, nystatin, ondansetron (ZOFRAN) IV, zolpidem   Vital Signs    Vitals:   12/09/21 1521 12/09/21 2132 12/10/21 0523 12/10/21 0605  BP: (!) 156/58 (!) 191/56 (!) 130/52   Pulse: 70 69 67   Resp: 20 16 18    Temp:  98.1 F (36.7 C) 98.4 F (36.9 C)   TempSrc:  Oral    SpO2: 100% 100% 100%   Weight:    (!) 140 kg  Height:        Intake/Output Summary (Last 24 hours) at 12/10/2021 0906 Last data filed at 12/09/2021 2130 Gross per 24 hour  Intake 600 ml  Output 780 ml  Net -180 ml      12/10/2021    6:05 AM 12/08/2021    8:36 AM 11/03/2021   10:31 AM  Last 3 Weights  Weight (lbs) 308 lb 9.6 oz 300 lb 295 lb  Weight (kg) 139.98 kg 136.079 kg 133.811 kg      Telemetry    NSR, HR in 60's with  occasional PVC's. Brief narrow-complex tachycardia only lasting for less than 3 seconds and appearing consistent with an atrial tach.  - Personally Reviewed  ECG    No new tracings.   Physical Exam   GEN: Obese female appearing in no acute distress.   Neck: No JVD Cardiac: RRR, no murmurs, rubs, or gallops.  Respiratory: Clear to auscultation bilaterally without wheezing or rales. GI: Soft, nontender, non-distended  MS: 1+ pitting edema along RLE; L BKA.  Neuro:  Nonfocal  Psych: Normal affect   Labs    High Sensitivity Troponin:   Recent Labs  Lab 12/08/21 0913 12/08/21 1105 12/08/21 1435 12/08/21 1644  TROPONINIHS 15 14 13 12      Chemistry Recent Labs  Lab 12/08/21 0913 12/08/21 1435 12/09/21 0539 12/10/21 0440  NA 141  --  137 138  K 3.9  --  3.7 3.6  CL 105  --  101 98  CO2 28  --  30 31  GLUCOSE 323*  --  257* 287*  BUN 25*  --  22* 25*  CREATININE 0.83 0.93 0.82 0.95  CALCIUM 8.2*  --  8.4* 8.8*  MG  --   --   --  1.4*  PROT 6.9  --   --   --   ALBUMIN 3.2*  --   --   --   AST 26  --   --   --   ALT 19  --   --   --   ALKPHOS 79  --   --   --   BILITOT 0.4  --   --   --   GFRNONAA >60 >60 >60 >60  ANIONGAP 8  --  6 9    Lipids  Recent Labs  Lab 12/09/21 0539  CHOL 112  TRIG 221*  HDL 28*  LDLCALC 40  CHOLHDL 4.0    Hematology Recent Labs  Lab 12/08/21 0913 12/08/21 1435  WBC 9.9 10.5  RBC 3.42* 3.41*  HGB 10.6* 10.6*  HCT 33.7* 33.2*  MCV 98.5 97.4  MCH 31.0 31.1  MCHC 31.5 31.9  RDW 14.2 14.3  PLT 181 190   Thyroid No results for input(s): "TSH", "FREET4" in the last 168 hours.  BNP Recent Labs  Lab 12/08/21 0916  BNP 80.0    DDimer No results for input(s): "DDIMER" in the last 168 hours.   Radiology     CT Chest W Contrast  Result Date: 12/08/2021 CLINICAL DATA:  COPD exacerbation. Shortness of breath and chest pain. EXAM: CT CHEST WITH CONTRAST TECHNIQUE: Multidetector CT imaging of the chest was performed during  intravenous contrast administration. RADIATION DOSE REDUCTION: This exam was performed according to the departmental dose-optimization program which includes automated exposure control, adjustment of the mA and/or kV according to patient size and/or use of iterative reconstruction technique. CONTRAST:  156mL OMNIPAQUE IOHEXOL 300 MG/ML  SOLN COMPARISON:  Radiographs 12/08/2021 and 07/16/2022. FINDINGS: Cardiovascular: No acute vascular findings are demonstrated. There is mild atherosclerosis of the aorta, great vessels and coronary arteries. Mild central enlargement of the pulmonary arteries which may indicate pulmonary arterial hypertension. The heart size is normal. There is no pericardial effusion. Mediastinum/Nodes: There are no enlarged mediastinal, hilar or axillary lymph nodes. The thyroid gland, trachea and esophagus demonstrate no significant findings. Lungs/Pleura: No pleural effusion or pneumothorax. Minimal atelectasis at the lung bases. No confluent airspace opacity or suspicious pulmonary nodule. Upper abdomen: Diffuse low density throughout the liver consistent with steatosis. The hepatic contours are irregular, and there is relative enlargement of the left lobe suspicious for early cirrhosis. No focal hepatic lesions are identified. Small lymph nodes in the porta hepatis are likely reactive. Musculoskeletal/Chest wall: There is no chest wall mass or suspicious osseous finding. Mild thoracic spondylosis. Mild dependent edema within the chest wall. IMPRESSION: 1. No acute findings or explanation for the patient's symptoms. 2. Mild central enlargement of the pulmonary arteries may indicate pulmonary arterial hypertension. 3. Hepatic steatosis with possible early cirrhosis. 4.  Aortic Atherosclerosis (ICD10-I70.0). Electronically Signed   By: Richardean Sale M.D.   On: 12/08/2021 10:51    Cardiac Studies   Echocardiogram: 12/09/2021 IMPRESSIONS     1. Left ventricular ejection fraction, by  estimation, is 65 to 70%. The  left ventricle has hyperdynamic function. The left ventricle has no  regional wall motion abnormalities. Left ventricular diastolic parameters  are indeterminate.   2. Right ventricular systolic function was not well visualized. The right  ventricular size is not well visualized.   3. The mitral valve was not well visualized. No evidence of mitral valve  regurgitation.  No evidence of mitral stenosis.   4. The aortic valve is calcified. Aortic valve regurgitation is not  visualized. No aortic stenosis is present.   Patient Profile     56 y.o. female w/ PMH of CAD (s/p stenting in 2009, low-risk NST in 2013, reported 2 more stents in [place] in 2020 and history of false negative stress tests, cath in 07/2021 showing patent stents along the RCA and mid LAD with only mild nonobstructive disease), paroxysmal atrial fibrillation (isolated episode in 09/2019 and not started on chronic anticoagulation), HTN, HLD, PAD (s/p L BKA), COPD and Type 2 DM who is currently admitted for evaluation of chest pain and worsening dyspnea.   Assessment & Plan    1. Dyspnea on Exertion/HFpEF - Presented with worsening dyspnea which started the day of admission but had experienced progressive abdominal distension and lower extremity edema prior to this.  - BNP was normal at 80 but accuracy limited by body habitus. Repeat echo shows a normal EF of 65-70% with no WMA and no significant valve abnormalities.  - She was started on IV Lasix 40mg  BID yesterday with a recorded net output of -1.6 far. Weight at 308 lbs today but not trended this admission. Labs this AM show her Mg is low at 1.4 with K+ at 3.6 and creatinine remains stable at 0.95. Mg supplementation already ordered by the admitting team and will order additional K+ to keep this ~ 4.0. Continue with IV Lasix today. Can likely switch to Torsemide tomorrow and can consider an SGLT2 inhibitor prior to discharge.    2. CAD/Atypical  Chest Pain - She has a history of stenting to the RCA and LAD and given her history of reported false-negative stress tests, she underwent a repeat cardiac catheterization in 07/2021 as outlined above which showed patent stents with otherwise mild nonobstructive disease.  - Her pain this admission was atypical for angina as it was pleuritic and not associated with exertion. Hs Troponin values have been negative and repeat echo shows a preserved EF of 65-70% with no regional WMA.  - No plans for further ischemic testing at this time. Continue current medical therapy with Plavix 75mg  daily (allergic to ASA), Lopressor 50mg  BID and Crestor 40mg  daily. Not on Imdur due to prior intolerances.   3. HTN - BP listed as being significantly variable from 130/52 - 191/62. This was not checked last night when elevated but would recommend manual recheck if she has another outlier like this. Remains on Lopressor 50mg  BID and Lisinopril 10mg  BID (can consolidate to once daily dosing for ease of administration).    4. HLD - FLP this admission shows her LDL is at 40. Continue PTA Crestor 40mg  daily.    5. IDDM - Hgb A1c elevated to 9.5 this admission. Adjustment of medical therapy per the admitting team.    6. OSA - Continued compliance with CPAP encouraged.   For questions or updates, please contact [place] Please consult [email or web address] for contact info under        Signed, Erma Heritage, PA-C  12/10/2021, 9:06 AM

## 2021-12-10 NOTE — Progress Notes (Signed)
Pt has rested well this shift.  Wearing CPAP during noc.  Denies pain.  Able to use wheelchair to get up to [facility].  NSR on monitor.

## 2021-12-10 NOTE — Inpatient Diabetes Management (Signed)
Inpatient Diabetes Program Recommendations  AACE/ADA: New Consensus Statement on Inpatient Glycemic Control (2015)  Target Ranges:  Prepandial:   less than 140 mg/dL      Peak postprandial:   less than 180 mg/dL (1-2 hours)      Critically ill patients:  140 - 180 mg/dL   Lab Results  Component Value Date   GLUCAP 268 (H) 12/10/2021   HGBA1C 9.5 (H) 12/08/2021    Review of Glycemic Control  Latest Reference Range & Units 12/09/21 07:32 12/09/21 11:08 12/09/21 16:39 12/10/21 07:29  Glucose-Capillary 70 - 99 mg/dL 266 (H) 335 (H) 268 (H) 268 (H)    Diabetes history: DM 2 Outpatient Diabetes medications: Glipizide 10 mg bid, Concentrated Humulin U-500 80 units tid, Ozempic 1 mg Weekly Current orders for Inpatient glycemic control:  Semglee 30 units Daily Novolog 0-20 units tid + hs Novolog 3 units tid meal coverage  Inpatient Diabetes Program Recommendations:    Please note Novolog not given yet this am for glucose 268. Will need recheck and coverage. Anticipate glucose will be high at lunchtime. Sent message to RN to check glucose and cover with Novolog this am.  -  Increase Semglee to 35 units   Thanks,  Tama Headings RN, MSN, BC-ADM Inpatient Diabetes Coordinator Team Pager [phone] (8a-5p)

## 2021-12-10 NOTE — Progress Notes (Signed)
PROGRESS NOTE    Nicole [name]  KGM:[id number] DOB: 02-04-1966 DOA: 12/08/2021 PCP: Benita Stabile, MD   Brief Narrative:  Nicole [name] is a 56 year old female with extensive history of HTN, HLD, DM 2, COPD, h/o diabetic foot, left BKA depression/anxiety, morbid obesity, CAD to stent to LAD, and 1 at RCA, with a recent left heart catheterization on 08/14/2021 -on Plavix and statin.  She presented with shortness of breath and has been admitted with dyspnea on exertion concerning for acute CHF exacerbation.  She has been started on IV diuresis and 2D echocardiogram pending.  Cardiology consulted.    Assessment & Plan:   Principal Problem:   Chest pain Active Problems:   Coronary artery disease involving native coronary artery of native heart with angina pectoris (HCC)   Dyslipidemia   COPD (chronic obstructive pulmonary disease) (HCC)   Primary hypertension   TIA (transient ischemic attack)   Morbid obesity with body mass index (BMI) of 50.0 to 59.9 in adult (HCC)   Anxiety and depression   Hx of BKA, left (HCC)   Uncontrolled type 2 diabetes mellitus with hyperglycemia, with long-term current use of insulin (HCC)   Acute on chronic heart failure with preserved ejection fraction (HCC)   OSA on CPAP   Non-cardiac chest pain  Assessment and Plan:  Acute diastolic congestive heart failure (HCC) - History of diastolic congestive heart failure -2D echocardiogram with LVEF 65-70% -IV Lasix 40 mg twice daily to continue with -1.6 L output noted yesterday -Check strict I's and O's and daily weights -Appreciate cardiology involvement   Coronary artery disease involving native coronary artery of native heart with angina pectoris (HCC) - Continue Plavix, statins,  -Patient had a left heart catheterization in August 14, 2021 Minimum diffuse stenosis noted with 1 stent to RCA, and 2 stent to LAD, EJ EF 55-65% At the time recommendation for Plavix(allergic to aspirin), optimizing blood  pressure control, LDL less than 70, tight control of DM 2   COPD (chronic obstructive pulmonary disease) (HCC) - Not O2 dependent, no signs of exacerbation -Monitoring closely, as needed DuoNeb bronchodilators   Dyslipidemia - Continue lovastatin  -LDL 40 on lipid panel    Hx of BKA, left (HCC) - Stump clean, no signs of infection   Anxiety and depression - Currently stable, continue as needed now benzodiazepines -Resuming home medication   Morbid obesity with body mass index (BMI) of 50.0 to 59.9 in adult (HCC) Body mass index is 53.14 kg/m. -Discussed with patient regarding her plan to follow-up with PCP: For strict diet exercise And referral to weight loss clinic -The benefit weight loss we discussed in detail--she expressed understanding   TIA (transient ischemic attack) - Stable continue home medications lovastatin and Plavix   HTN (hypertension), benign - Currently stable resuming home medication  -Okay to maintain on metoprolol and lisinopril twice daily   Uncontrolled type 2 diabetes mellitus with hyperglycemia, with long-term current use of insulin (HCC) - Hemoglobin A1c 9.5% -Hold home medications -Appreciate diabetes coordinator recommendations, increase Semglee to 35u due to hyperglycemia and continue mealtime NovoLog 3 units     DVT prophylaxis:Heparin Code Status: Full Family Communication: None at bedside Disposition Plan: Admit for diuresis Status is: Observation The patient will require care spanning > 2 midnights and should be moved to inpatient because: Need for IV diuresis.      Nutritional Assessment:   The patient's BMI is: Body mass index is 53.14 kg/m.Marland Kitchen   Seen by dietician.  I agree with the assessment and plan as outlined below:   Consultants:  Cardiology   Procedures:  None   Antimicrobials:  None   Subjective: Patient seen and evaluated today and is somnolent and using her CPAP mask.  She continues to have significant ongoing  volume overload.  Objective: Vitals:   12/09/21 1521 12/09/21 2132 12/10/21 0523 12/10/21 0605  BP: (!) 156/58 (!) 191/56 (!) 130/52   Pulse: 70 69 67   Resp: 20 16 18    Temp:  98.1 F (36.7 C) 98.4 F (36.9 C)   TempSrc:  Oral    SpO2: 100% 100% 100%   Weight:    (!) 140 kg  Height:        Intake/Output Summary (Last 24 hours) at 12/10/2021 1037 Last data filed at 12/10/2021 0900 Gross per 24 hour  Intake 840 ml  Output --  Net 840 ml   Filed Weights   12/08/21 0836 12/10/21 0605  Weight: 136.1 kg (!) 140 kg    Examination:  General exam: Appears calm and comfortable, obese Respiratory system: Clear to auscultation. Respiratory effort normal.  CPAP Cardiovascular system: S1 & S2 heard, RRR.  Gastrointestinal system: Abdomen is soft Central nervous system: Alert and awake Extremities: Left BKA Skin: No significant lesions noted Psychiatry: Flat affect.    Data Reviewed: I have personally reviewed following labs and imaging studies  CBC: Recent Labs  Lab 12/08/21 0913 12/08/21 1435  WBC 9.9 10.5  NEUTROABS 6.5  --   HGB 10.6* 10.6*  HCT 33.7* 33.2*  MCV 98.5 97.4  PLT 181 190   Basic Metabolic Panel: Recent Labs  Lab 12/08/21 0913 12/08/21 1435 12/09/21 0539 12/10/21 0440  NA 141  --  137 138  K 3.9  --  3.7 3.6  CL 105  --  101 98  CO2 28  --  30 31  GLUCOSE 323*  --  257* 287*  BUN 25*  --  22* 25*  CREATININE 0.83 0.93 0.82 0.95  CALCIUM 8.2*  --  8.4* 8.8*  MG  --   --   --  1.4*   GFR: Estimated Creatinine Clearance: 90.1 mL/min (by C-G formula based on SCr of 0.95 mg/dL). Liver Function Tests: Recent Labs  Lab 12/08/21 0913  AST 26  ALT 19  ALKPHOS 79  BILITOT 0.4  PROT 6.9  ALBUMIN 3.2*   No results for input(s): "LIPASE", "AMYLASE" in the last 168 hours. No results for input(s): "AMMONIA" in the last 168 hours. Coagulation Profile: No results for input(s): "INR", "PROTIME" in the last 168 hours. Cardiac Enzymes: No  results for input(s): "CKTOTAL", "CKMB", "CKMBINDEX", "TROPONINI" in the last 168 hours. BNP (last 3 results) No results for input(s): "PROBNP" in the last 8760 hours. HbA1C: Recent Labs    12/08/21 1435  HGBA1C 9.5*   CBG: Recent Labs  Lab 12/08/21 2240 12/09/21 0732 12/09/21 1108 12/09/21 1639 12/10/21 0729  GLUCAP 219* 266* 335* 268* 268*   Lipid Profile: Recent Labs    12/09/21 0539  CHOL 112  HDL 28*  LDLCALC 40  TRIG 12/11/21*  CHOLHDL 4.0   Thyroid Function Tests: No results for input(s): "TSH", "T4TOTAL", "FREET4", "T3FREE", "THYROIDAB" in the last 72 hours. Anemia Panel: No results for input(s): "VITAMINB12", "FOLATE", "FERRITIN", "TIBC", "IRON", "RETICCTPCT" in the last 72 hours. Sepsis Labs: No results for input(s): "PROCALCITON", "LATICACIDVEN" in the last 168 hours.  No results found for this or any previous visit (from the past 240 hour(s)).  Radiology Studies: ECHOCARDIOGRAM COMPLETE  Result Date: 12/09/2021    ECHOCARDIOGRAM REPORT   Patient Name:   DEADRA DIGGINS [facility] Date of Exam: 12/09/2021 Medical Rec #:  [id number]       Height:       63.0 in Accession #:    [id number]      Weight:       300.0 lb Date of Birth:  08/29/1965        BSA:          2.297 m Patient Age:    53 years        BP:           139/56 mmHg Patient Gender: F               HR:           70 bpm. Exam Location:  [facility] Procedure: 2D Echo, Cardiac Doppler, Color Doppler and Intracardiac            Opacification Agent Indications:    CHF  History:        Patient has prior history of Echocardiogram examinations, most                 recent 04/06/2011. CHF, CAD, TIA and COPD, Arrythmias:Atrial                 Fibrillation, Signs/Symptoms:Chest Pain and Murmur; Risk                 Factors:Hypertension, Diabetes, Dyslipidemia and Former Smoker.  Sonographer:    Wenda Low Referring Phys: [id number] [place] D DeWitt  1. Left ventricular ejection fraction, by estimation, is 65 to  70%. The left ventricle has hyperdynamic function. The left ventricle has no regional wall motion abnormalities. Left ventricular diastolic parameters are indeterminate.  2. Right ventricular systolic function was not well visualized. The right ventricular size is not well visualized.  3. The mitral valve was not well visualized. No evidence of mitral valve regurgitation. No evidence of mitral stenosis.  4. The aortic valve is calcified. Aortic valve regurgitation is not visualized. No aortic stenosis is present. FINDINGS  Left Ventricle: Left ventricular ejection fraction, by estimation, is 65 to 70%. The left ventricle has hyperdynamic function. The left ventricle has no regional wall motion abnormalities. Definity contrast agent was given IV to delineate the left ventricular endocardial borders. The left ventricular internal cavity size was normal in size. Left ventricular diastolic parameters are indeterminate. Right Ventricle: The right ventricular size is not well visualized. Right vetricular wall thickness was not well visualized. Right ventricular systolic function was not well visualized. Left Atrium: Left atrial size was not well visualized. Right Atrium: Right atrial size was not well visualized. Pericardium: The pericardium was not well visualized. Mitral Valve: The mitral valve was not well visualized. No evidence of mitral valve regurgitation. No evidence of mitral valve stenosis. MV peak gradient, 4.5 mmHg. The mean mitral valve gradient is 2.0 mmHg. Tricuspid Valve: The tricuspid valve is not well visualized. Tricuspid valve regurgitation is not demonstrated. No evidence of tricuspid stenosis. Aortic Valve: The aortic valve is calcified. Aortic valve regurgitation is not visualized. No aortic stenosis is present. Aortic valve mean gradient measures 7.0 mmHg. Aortic valve peak gradient measures 12.0 mmHg. Aortic valve area, by VTI measures 2.51  cm. Pulmonic Valve: The pulmonic valve was not well  visualized. Pulmonic valve regurgitation is not visualized. No evidence of pulmonic stenosis. Aorta: The aortic arch was not well  visualized and the ascending aorta was not well visualized. Venous: The inferior vena cava was not well visualized. IAS/Shunts: No atrial level shunt detected by color flow Doppler.  LEFT VENTRICLE PLAX 2D LVIDd:         6.60 cm LVIDs:         4.60 cm LV PW:         1.10 cm LV IVS:        1.10 cm LVOT diam:     2.20 cm LV SV:         104 LV SV Index:   45 LVOT Area:     3.80 cm  LEFT ATRIUM         Index LA diam:    3.90 cm 1.70 cm/m  AORTIC VALVE                     PULMONIC VALVE AV Area (Vmax):    2.57 cm      PV Vmax:       1.45 m/s AV Area (Vmean):   2.60 cm      PV Peak grad:  8.4 mmHg AV Area (VTI):     2.51 cm AV Vmax:           173.00 cm/s AV Vmean:          126.000 cm/s AV VTI:            0.413 m AV Peak Grad:      12.0 mmHg AV Mean Grad:      7.0 mmHg LVOT Vmax:         117.00 cm/s LVOT Vmean:        86.100 cm/s LVOT VTI:          0.273 m LVOT/AV VTI ratio: 0.66  AORTA Ao Root diam: 3.30 cm MITRAL VALVE MV Area (PHT): 4.68 cm     SHUNTS MV Area VTI:   3.52 cm     Systemic VTI:  0.27 m MV Peak grad:  4.5 mmHg     Systemic Diam: 2.20 cm MV Mean grad:  2.0 mmHg MV Vmax:       1.06 m/s MV Vmean:      67.7 cm/s MV Decel Time: 162 msec MV E velocity: 106.00 cm/s MV A velocity: 60.20 cm/s MV E/A ratio:  1.76 Vishnu Priya Mallipeddi Electronically signed by Winfield Rast Mallipeddi Signature Date/Time: 12/09/2021/6:31:01 PM    Final    CT Chest W Contrast  Result Date: 12/08/2021 CLINICAL DATA:  COPD exacerbation. Shortness of breath and chest pain. EXAM: CT CHEST WITH CONTRAST TECHNIQUE: Multidetector CT imaging of the chest was performed during intravenous contrast administration. RADIATION DOSE REDUCTION: This exam was performed according to the departmental dose-optimization program which includes automated exposure control, adjustment of the mA and/or kV according to  patient size and/or use of iterative reconstruction technique. CONTRAST:  OMNIPAQUE IOHEXOL 300 MG/ML  SOLN COMPARISON:  Radiographs 12/08/2021 and 07/16/2022. FINDINGS: Cardiovascular: No acute vascular findings are demonstrated. There is mild atherosclerosis of the aorta, great vessels and coronary arteries. Mild central enlargement of the pulmonary arteries which may indicate pulmonary arterial hypertension. The heart size is normal. There is no pericardial effusion. Mediastinum/Nodes: There are no enlarged mediastinal, hilar or axillary lymph nodes. The thyroid gland, trachea and esophagus demonstrate no significant findings. Lungs/Pleura: No pleural effusion or pneumothorax. Minimal atelectasis at the lung bases. No confluent airspace opacity or suspicious pulmonary nodule. Upper abdomen: Diffuse low density throughout  the liver consistent with steatosis. The hepatic contours are irregular, and there is relative enlargement of the left lobe suspicious for early cirrhosis. No focal hepatic lesions are identified. Small lymph nodes in the porta hepatis are likely reactive. Musculoskeletal/Chest wall: There is no chest wall mass or suspicious osseous finding. Mild thoracic spondylosis. Mild dependent edema within the chest wall. IMPRESSION: 1. No acute findings or explanation for the patient's symptoms. 2. Mild central enlargement of the pulmonary arteries may indicate pulmonary arterial hypertension. 3. Hepatic steatosis with possible early cirrhosis. 4.  Aortic Atherosclerosis (ICD10-I70.0). Electronically Signed   By: Carey BullocksWilliam  Veazey M.D.   On: 12/08/2021 10:51        Scheduled Meds:  buPROPion ER  100 mg Oral BID   cariprazine  3 mg Oral Daily   clopidogrel  75 mg Oral Daily   Finerenone  1 tablet Oral Daily   furosemide  40 mg Intravenous BID   Gerhardt's butt cream  1 Application Topical BID   heparin  5,000 Units Subcutaneous Q8H   insulin aspart  0-20 Units Subcutaneous TID WC    insulin aspart  0-5 Units Subcutaneous QHS   insulin aspart  3 Units Subcutaneous TID WC   insulin glargine-yfgn  35 Units Subcutaneous QHS   lisinopril  10 mg Oral BID   melatonin  3 mg Oral QHS   metoprolol tartrate  50 mg Oral BID   mupirocin ointment   Topical Daily   pantoprazole  40 mg Oral QHS   rosuvastatin  40 mg Oral QHS     LOS: 0 days    Time spent: 35 minutes    Rashaun Wichert Hoover Brunette Ernestene Coover, DO [facility]  If 7PM-7AM, please contact night-coverage [email or web address] 12/10/2021, 10:37 AM

## 2021-12-11 DIAGNOSIS — I5031 Acute diastolic (congestive) heart failure: Secondary | ICD-10-CM | POA: Diagnosis not present

## 2021-12-11 DIAGNOSIS — R0789 Other chest pain: Secondary | ICD-10-CM | POA: Diagnosis not present

## 2021-12-11 DIAGNOSIS — G4733 Obstructive sleep apnea (adult) (pediatric): Secondary | ICD-10-CM | POA: Diagnosis not present

## 2021-12-11 DIAGNOSIS — I480 Paroxysmal atrial fibrillation: Secondary | ICD-10-CM

## 2021-12-11 DIAGNOSIS — I2510 Atherosclerotic heart disease of native coronary artery without angina pectoris: Secondary | ICD-10-CM | POA: Diagnosis not present

## 2021-12-11 DIAGNOSIS — I10 Essential (primary) hypertension: Secondary | ICD-10-CM | POA: Diagnosis not present

## 2021-12-11 LAB — BASIC METABOLIC PANEL
Anion gap: 11 (ref 5–15)
BUN: 32 mg/dL — ABNORMAL HIGH (ref 6–20)
CO2: 31 mmol/L (ref 22–32)
Calcium: 9 mg/dL (ref 8.9–10.3)
Chloride: 96 mmol/L — ABNORMAL LOW (ref 98–111)
Creatinine, Ser: 1.1 mg/dL — ABNORMAL HIGH (ref 0.44–1.00)
GFR, Estimated: 59 mL/min — ABNORMAL LOW (ref >60)
Glucose, Bld: 284 mg/dL — ABNORMAL HIGH (ref 70–99)
Potassium: 4 mmol/L (ref 3.5–5.1)
Sodium: 138 mmol/L (ref 135–145)

## 2021-12-11 LAB — GLUCOSE, CAPILLARY
Glucose-Capillary: 290 mg/dL — ABNORMAL HIGH (ref 70–99)
Glucose-Capillary: 313 mg/dL — ABNORMAL HIGH (ref 70–99)
Glucose-Capillary: 312 mg/dL — ABNORMAL HIGH (ref 70–99)

## 2021-12-11 LAB — MAGNESIUM: Magnesium: 1.6 mg/dL — ABNORMAL LOW (ref 1.7–2.4)

## 2021-12-11 MED ORDER — INSULIN GLARGINE-YFGN 100 UNIT/ML ~~LOC~~ SOLN
50.0000 [IU] | Freq: Every day | SUBCUTANEOUS | Status: DC
  Administered 2021-12-11 – 2021-12-12 (×2): 50 [IU] via SUBCUTANEOUS
  Filled 2021-12-11 (×3): qty 0.5

## 2021-12-11 MED ORDER — MAGNESIUM SULFATE 2 GM/50ML IV SOLN
2.0000 g | Freq: Once | INTRAVENOUS | Status: AC
  Administered 2021-12-11: 2 g via INTRAVENOUS
  Filled 2021-12-11: qty 50

## 2021-12-11 MED ORDER — LOPERAMIDE HCL 2 MG PO CAPS
2.0000 mg | ORAL_CAPSULE | ORAL | Status: DC | PRN
  Administered 2021-12-11 – 2021-12-12 (×2): 2 mg via ORAL
  Filled 2021-12-11 (×2): qty 1

## 2021-12-11 NOTE — Progress Notes (Signed)
Progress Note  Patient Name: Nicole [name] Date of Encounter: 12/11/2021  Primary Cardiologist: Dorris Carnes, MD  Subjective   No acute events overnight. Patient still feeling SOB with moving in her wheelchair to the bathroom.  Inpatient Medications    Scheduled Meds:  buPROPion ER  100 mg Oral BID   cariprazine  3 mg Oral Daily   clopidogrel  75 mg Oral Daily   furosemide  80 mg Intravenous BID   Gerhardt's butt cream  1 Application Topical BID   heparin  5,000 Units Subcutaneous Q8H   insulin aspart  0-20 Units Subcutaneous TID WC   insulin aspart  0-5 Units Subcutaneous QHS   insulin aspart  3 Units Subcutaneous TID WC   insulin glargine-yfgn  50 Units Subcutaneous QHS   lisinopril  10 mg Oral BID   melatonin  3 mg Oral QHS   metoprolol tartrate  50 mg Oral BID   mupirocin ointment   Topical Daily   pantoprazole  40 mg Oral QHS   rosuvastatin  40 mg Oral QHS   Continuous Infusions:  PRN Meds: acetaminophen, albuterol, ALPRAZolam, hydrALAZINE, loperamide, nitroGLYCERIN, nystatin, ondansetron (ZOFRAN) IV, zolpidem   Vital Signs    Vitals:   12/10/21 1306 12/10/21 2136 12/11/21 0544 12/11/21 1141  BP: (!) 165/53 120/61 123/62 125/60  Pulse: 63 68 67 67  Resp:  20 20 20   Temp: 98 F (36.7 C) 97.8 F (36.6 C) 97.7 F (36.5 C) 97.6 F (36.4 C)  TempSrc: Oral     SpO2: 100% 98% 95% 100%  Weight:   (!) 137.9 kg   Height:        Intake/Output Summary (Last 24 hours) at 12/11/2021 1518 Last data filed at 12/10/2021 1827 Gross per 24 hour  Intake 240 ml  Output 1500 ml  Net -1260 ml   Filed Weights   12/08/21 0836 12/10/21 0605 12/11/21 0544  Weight: 136.1 kg (!) 140 kg (!) 137.9 kg    Telemetry     Personally reviewed: Hr controlled  ECG    An ECG dated 12/09/21 was personally reviewed today and demonstrated:  NSR  Physical Exam   GEN: No acute distress.   Neck: No JVD. Cardiac: RRR, no murmur, rub, or gallop.  Respiratory: Nonlabored.  Clear to auscultation bilaterally. GI: Soft, nontender, bowel sounds present. MS: No edema; No deformity., L BKA Neuro:  Nonfocal. Psych: Alert and oriented x 3. Normal affect.  Labs    Chemistry Recent Labs  Lab 12/08/21 0913 12/08/21 1435 12/09/21 0539 12/10/21 0440 12/11/21 0538  NA 141  --  137 138 138  K 3.9  --  3.7 3.6 4.0  CL 105  --  101 98 96*  CO2 28  --  30 31 31   GLUCOSE 323*  --  257* 287* 284*  BUN 25*  --  22* 25* 32*  CREATININE 0.83   < > 0.82 0.95 1.10*  CALCIUM 8.2*  --  8.4* 8.8* 9.0  PROT 6.9  --   --   --   --   ALBUMIN 3.2*  --   --   --   --   AST 26  --   --   --   --   ALT 19  --   --   --   --   ALKPHOS 79  --   --   --   --   BILITOT 0.4  --   --   --   --  GFRNONAA >60   < > >60 >60 59*  ANIONGAP 8  --  6 9 11    < > = values in this interval not displayed.     Hematology Recent Labs  Lab 12/08/21 0913 12/08/21 1435  WBC 9.9 10.5  RBC 3.42* 3.41*  HGB 10.6* 10.6*  HCT 33.7* 33.2*  MCV 98.5 97.4  MCH 31.0 31.1  MCHC 31.5 31.9  RDW 14.2 14.3  PLT 181 190    Cardiac Enzymes Recent Labs  Lab 12/08/21 0913 12/08/21 1105 12/08/21 1435 12/08/21 1644  TROPONINIHS 15 14 13 12     BNP Recent Labs  Lab 12/08/21 0916  BNP 80.0     DDimerNo results for input(s): "DDIMER" in the last 168 hours.   Radiology    No results found.  Cardiac Studies   Echo in 12/09/21 1. Left ventricular ejection fraction, by estimation, is 65 to 70%. The  left ventricle has hyperdynamic function. The left ventricle has no  regional wall motion abnormalities. Left ventricular diastolic parameters  are indeterminate.   2. Right ventricular systolic function was not well visualized. The right  ventricular size is not well visualized.   3. The mitral valve was not well visualized. No evidence of mitral valve  regurgitation. No evidence of mitral stenosis.   4. The aortic valve is calcified. Aortic valve regurgitation is not  visualized. No  aortic stenosis is present.  Cath in 07/2021 Mild nonobstructive CAD with 20% stenosis in a very proximal diagonal (optional diagonal) vessel with 20% segmental proximal LAD stenosis with widely patent previously placed mid LAD stent.  The left circumflex coronary artery is angiographically normal.  The previously placed stent in the right coronary artery is widely patent and there is mild 30% mid and distal both stenoses.  Assessment & Plan   Patient is a 56 y/o F known to have CAD s/p PCI to RCA an dLAD with repeat Cath in 07/23 showing patent stents, PAD s/p L BKA, Hx paroxysmal Afib,HTN, DM, COPD presented to the hospital with DOE and admitted with HFpEF exacerbation.   #HFpEF exacerbation PLAN -Continue IV Lasix 80mg  BID  #CAD s/p LAD and RCA PCI with recent Cath in 07/23 showing patent stents PLAN -Continue Plavix 75mg  once daily due to ASA allergy -Continue Metoprolol tartarate 50mg  BID and switch lisinopril from 10mg  to 20mg   once daily  #Paroxysmal Afib PLAN -Continue Metoprolol tartarate 50mg   BID and defer to primary cardiologist regarding AC.  #HTN, controlled -Continue Metoprolol tartarate 50mg  BID and switch lisinopril from 10mg  to 20mg  once daily  #OSA PLAN -Continue CPAP  I have spent a total of 35 minutes with patient reviewing chart, telemetry, EKGs, labs and examining patient as well as establishing an assessment and plan that was discussed with the patient.  > 50% of time was spent in direct patient care.     Signed, 8/23, MD  12/11/2021, 3:18 PM

## 2021-12-11 NOTE — Progress Notes (Signed)
Called daughter Marzetta Board at work, says she will bring in kidney medication Finerenone since our pharmacy does not carry it.

## 2021-12-11 NOTE — Inpatient Diabetes Management (Signed)
Inpatient Diabetes Program Recommendations  AACE/ADA: New Consensus Statement on Inpatient Glycemic Control (2015)  Target Ranges:  Prepandial:   less than 140 mg/dL      Peak postprandial:   less than 180 mg/dL (1-2 hours)      Critically ill patients:  140 - 180 mg/dL   Lab Results  Component Value Date   GLUCAP 290 (H) 12/11/2021   HGBA1C 9.5 (H) 12/08/2021    Review of Glycemic Control  Latest Reference Range & Units 12/10/21 07:29 12/10/21 11:20 12/10/21 16:44 12/10/21 21:47 12/11/21 07:53  Glucose-Capillary 70 - 99 mg/dL 268 (H) 314 (H) 254 (H) 280 (H) 290 (H)   Diabetes history: DM 2 Outpatient Diabetes medications: Glipizide 10 mg bid, Concentrated Humulin U-500 80 units tid, Ozempic 1 mg Weekly Current orders for Inpatient glycemic control:  Semglee 35 units Daily Novolog 0-20 units tid + hs Novolog 3 units tid meal coverage  Inpatient Diabetes Program Recommendations:    Please note pt on concentrated U500 insulin at home.  -  Increase Semglee to 50 units -  Increase Novolog meal coverage to 10 units tid if eating >50% of meals   Thanks,  Tama Headings RN, MSN, BC-ADM Inpatient Diabetes Coordinator Team Pager [phone] (8a-5p)

## 2021-12-11 NOTE — Care Management Important Message (Signed)
Important Message  Patient Details  Name: Nicole [name] MRN: [id number] Date of Birth: 03/24/1965   Medicare Important Message Given:  Yes     Tommy Medal 12/11/2021, 4:50 PM

## 2021-12-11 NOTE — Progress Notes (Signed)
PROGRESS NOTE    CIEARRA RUFO  IOX:[id number] DOB: 1965/11/30 DOA: 12/08/2021 PCP: Benita Stabile, MD   Brief Narrative:  Nicole [name] is a 56 year old female with extensive history of HTN, HLD, DM 2, COPD, h/o diabetic foot, left BKA depression/anxiety, morbid obesity, CAD to stent to LAD, and 1 at RCA, with a recent left heart catheterization on 08/14/2021 -on Plavix and statin.  She presented with shortness of breath and has been admitted with dyspnea on exertion concerning for acute CHF exacerbation.  She has been started on IV diuresis and 2D echocardiogram pending.  Cardiology consulted.    Assessment & Plan:   Principal Problem:   Chest pain Active Problems:   Coronary artery disease involving native coronary artery of native heart with angina pectoris (HCC)   Dyslipidemia   COPD (chronic obstructive pulmonary disease) (HCC)   Primary hypertension   TIA (transient ischemic attack)   Morbid obesity with body mass index (BMI) of 50.0 to 59.9 in adult (HCC)   Anxiety and depression   Hx of BKA, left (HCC)   Uncontrolled type 2 diabetes mellitus with hyperglycemia, with long-term current use of insulin (HCC)   Acute on chronic heart failure with preserved ejection fraction (HCC)   OSA on CPAP   Non-cardiac chest pain   Acute diastolic CHF (congestive heart failure) (HCC)  Assessment and Plan:   Acute diastolic congestive heart failure (HCC) - History of diastolic congestive heart failure -2D echocardiogram with LVEF 65-70% -IV Lasix 80 mg BID per Cardiology recommendations -Check strict I's and O's and daily weights -Appreciate cardiology involvement   Coronary artery disease involving native coronary artery of native heart with angina pectoris (HCC) - Continue Plavix, statins,  -Patient had a left heart catheterization in August 14, 2021 Minimum diffuse stenosis noted with 1 stent to RCA, and 2 stent to LAD, EJ EF 55-65% At the time recommendation for Plavix(allergic  to aspirin), optimizing blood pressure control, LDL less than 70, tight control of DM 2   COPD (chronic obstructive pulmonary disease) (HCC) - Not O2 dependent, no signs of exacerbation -Monitoring closely, as needed DuoNeb bronchodilators   Dyslipidemia - Continue lovastatin  -LDL 40 on lipid panel    Hx of BKA, left (HCC) - Stump clean, no signs of infection   Anxiety and depression - Currently stable, continue as needed now benzodiazepines -Resuming home medication   Morbid obesity with body mass index (BMI) of 50.0 to 59.9 in adult (HCC) Body mass index is 53.14 kg/m. -Discussed with patient regarding her plan to follow-up with PCP: For strict diet exercise And referral to weight loss clinic -The benefit weight loss we discussed in detail--she expressed understanding   TIA (transient ischemic attack) - Stable continue home medications lovastatin and Plavix   HTN (hypertension), benign - Currently stable resuming home medication  -Okay to maintain on metoprolol and lisinopril twice daily   Uncontrolled type 2 diabetes mellitus with hyperglycemia, with long-term current use of insulin (HCC) - Hemoglobin A1c 9.5% -Hold home medications -Appreciate diabetes coordinator recommendations, increase Semglee to 35u due to hyperglycemia and continue mealtime NovoLog 3 units     DVT prophylaxis:Heparin Code Status: Full Family Communication: None at bedside Disposition Plan: Admit for diuresis Status is: Inpatient Remains inpatient appropriate because: Need for IV meds.     Nutritional Assessment:   The patient's BMI is: Body mass index is 53.14 kg/m.Marland Kitchen   Seen by dietician.  I agree with the assessment and plan as outlined  below:   Consultants:  Cardiology   Procedures:  None   Antimicrobials:  None   Subjective: Patient seen and evaluated today with no new acute complaints or concerns. No acute concerns or events noted overnight.  Her urine collection has not  been quite accurate as she has been flushing some of her urine down the toilet.  It does appear that she may have lost approximately 4 pounds of weight with diuresis thus far.  She continues to have abdominal distention.  Objective: Vitals:   12/10/21 1306 12/10/21 2136 12/11/21 0544 12/11/21 1141  BP: (!) 165/53 120/61 123/62 125/60  Pulse: 63 68 67 67  Resp:  20 20 20   Temp: 98 F (36.7 C) 97.8 F (36.6 C) 97.7 F (36.5 C) 97.6 F (36.4 C)  TempSrc: Oral     SpO2: 100% 98% 95% 100%  Weight:   (!) 137.9 kg   Height:        Intake/Output Summary (Last 24 hours) at 12/11/2021 1445 Last data filed at 12/10/2021 1827 Gross per 24 hour  Intake 240 ml  Output 1500 ml  Net -1260 ml   Filed Weights   12/08/21 0836 12/10/21 0605 12/11/21 0544  Weight: 136.1 kg (!) 140 kg (!) 137.9 kg    Examination:  General exam: Appears calm and comfortable, obese Respiratory system: Clear to auscultation. Respiratory effort normal. Cardiovascular system: S1 & S2 heard, RRR.  Gastrointestinal system: Abdomen is soft, distended Central nervous system: Alert and awake Extremities: No edema, left BKA Skin: No significant lesions noted Psychiatry: Flat affect.    Data Reviewed: I have personally reviewed following labs and imaging studies  CBC: Recent Labs  Lab 12/08/21 0913 12/08/21 1435  WBC 9.9 10.5  NEUTROABS 6.5  --   HGB 10.6* 10.6*  HCT 33.7* 33.2*  MCV 98.5 97.4  PLT 181 734   Basic Metabolic Panel: Recent Labs  Lab 12/08/21 0913 12/08/21 1435 12/09/21 0539 12/10/21 0440 12/11/21 0538  NA 141  --  137 138 138  K 3.9  --  3.7 3.6 4.0  CL 105  --  101 98 96*  CO2 28  --  30 31 31   GLUCOSE 323*  --  257* 287* 284*  BUN 25*  --  22* 25* 32*  CREATININE 0.83 0.93 0.82 0.95 1.10*  CALCIUM 8.2*  --  8.4* 8.8* 9.0  MG  --   --   --  1.4* 1.6*   GFR: Estimated Creatinine Clearance: 77.1 mL/min (A) (by C-G formula based on SCr of 1.1 mg/dL (H)). Liver Function  Tests: Recent Labs  Lab 12/08/21 0913  AST 26  ALT 19  ALKPHOS 79  BILITOT 0.4  PROT 6.9  ALBUMIN 3.2*   No results for input(s): "LIPASE", "AMYLASE" in the last 168 hours. No results for input(s): "AMMONIA" in the last 168 hours. Coagulation Profile: No results for input(s): "INR", "PROTIME" in the last 168 hours. Cardiac Enzymes: No results for input(s): "CKTOTAL", "CKMB", "CKMBINDEX", "TROPONINI" in the last 168 hours. BNP (last 3 results) No results for input(s): "PROBNP" in the last 8760 hours. HbA1C: No results for input(s): "HGBA1C" in the last 72 hours. CBG: Recent Labs  Lab 12/10/21 1120 12/10/21 1644 12/10/21 2147 12/11/21 0753 12/11/21 1108  GLUCAP 314* 254* 280* 290* 313*   Lipid Profile: Recent Labs    12/09/21 0539  CHOL 112  HDL 28*  LDLCALC 40  TRIG 221*  CHOLHDL 4.0   Thyroid Function Tests: No results for  input(s): "TSH", "T4TOTAL", "FREET4", "T3FREE", "THYROIDAB" in the last 72 hours. Anemia Panel: No results for input(s): "VITAMINB12", "FOLATE", "FERRITIN", "TIBC", "IRON", "RETICCTPCT" in the last 72 hours. Sepsis Labs: No results for input(s): "PROCALCITON", "LATICACIDVEN" in the last 168 hours.  No results found for this or any previous visit (from the past 240 hour(s)).       Radiology Studies: ECHOCARDIOGRAM COMPLETE  Result Date: 12/09/2021    ECHOCARDIOGRAM REPORT   Patient Name:   Rosezella FloridaLISA M [facility] Date of Exam: 12/09/2021 Medical Rec #:  [id number]       Height:       63.0 in Accession #:    [id number]      Weight:       300.0 lb Date of Birth:  05/28/1965        BSA:          2.297 m Patient Age:    56 years        BP:           139/56 mmHg Patient Gender: F               HR:           70 bpm. [facility] Procedure: 2D Echo, Cardiac Doppler, Color Doppler and Intracardiac            Opacification Agent Indications:    CHF  History:        Patient has prior history of Echocardiogram examinations, most                  recent 04/06/2011. CHF, CAD, TIA and COPD, Arrythmias:Atrial                 Fibrillation, Signs/Symptoms:Chest Pain and Murmur; Risk                 Factors:Hypertension, Diabetes, Dyslipidemia and Former Smoker.  Sonographer:    Mikki Harbororothy Buchanan Referring Phys: [id number] [facility] IMPRESSIONS  1. Left ventricular ejection fraction, by estimation, is 65 to 70%. The left ventricle has hyperdynamic function. The left ventricle has no regional wall motion abnormalities. Left ventricular diastolic parameters are indeterminate.  2. Right ventricular systolic function was not well visualized. The right ventricular size is not well visualized.  3. The mitral valve was not well visualized. No evidence of mitral valve regurgitation. No evidence of mitral stenosis.  4. The aortic valve is calcified. Aortic valve regurgitation is not visualized. No aortic stenosis is present. FINDINGS  Left Ventricle: Left ventricular ejection fraction, by estimation, is 65 to 70%. The left ventricle has hyperdynamic function. The left ventricle has no regional wall motion abnormalities. Definity contrast agent was given IV to delineate the left ventricular endocardial borders. The left ventricular internal cavity size was normal in size. Left ventricular diastolic parameters are indeterminate. Right Ventricle: The right ventricular size is not well visualized. Right vetricular wall thickness was not well visualized. Right ventricular systolic function was not well visualized. Left Atrium: Left atrial size was not well visualized. Right Atrium: Right atrial size was not well visualized. Pericardium: The pericardium was not well visualized. Mitral Valve: The mitral valve was not well visualized. No evidence of mitral valve regurgitation. No evidence of mitral valve stenosis. MV peak gradient, 4.5 mmHg. The mean mitral valve gradient is 2.0 mmHg. Tricuspid Valve: The tricuspid valve is not well visualized. Tricuspid valve regurgitation is not  demonstrated. No evidence of tricuspid stenosis. Aortic Valve: The aortic valve is  calcified. Aortic valve regurgitation is not visualized. No aortic stenosis is present. Aortic valve mean gradient measures 7.0 mmHg. Aortic valve peak gradient measures 12.0 mmHg. Aortic valve area, by VTI measures 2.51  cm. Pulmonic Valve: The pulmonic valve was not well visualized. Pulmonic valve regurgitation is not visualized. No evidence of pulmonic stenosis. Aorta: The aortic arch was not well visualized and the ascending aorta was not well visualized. Venous: The inferior vena cava was not well visualized. IAS/Shunts: No atrial level shunt detected by color flow Doppler.  LEFT VENTRICLE PLAX 2D LVIDd:         6.60 cm LVIDs:         4.60 cm LV PW:         1.10 cm LV IVS:        1.10 cm LVOT diam:     2.20 cm LV SV:         104 LV SV Index:   45 LVOT Area:     3.80 cm  LEFT ATRIUM         Index LA diam:    3.90 cm 1.70 cm/m  AORTIC VALVE                     PULMONIC VALVE AV Area (Vmax):    2.57 cm      PV Vmax:       1.45 m/s AV Area (Vmean):   2.60 cm      PV Peak grad:  8.4 mmHg AV Area (VTI):     2.51 cm AV Vmax:           173.00 cm/s AV Vmean:          126.000 cm/s AV VTI:            0.413 m AV Peak Grad:      12.0 mmHg AV Mean Grad:      7.0 mmHg LVOT Vmax:         117.00 cm/s LVOT Vmean:        86.100 cm/s LVOT VTI:          0.273 m LVOT/AV VTI ratio: 0.66  AORTA Ao Root diam: 3.30 cm MITRAL VALVE MV Area (PHT): 4.68 cm     SHUNTS MV Area VTI:   3.52 cm     Systemic VTI:  0.27 m MV Peak grad:  4.5 mmHg     Systemic Diam: 2.20 cm MV Mean grad:  2.0 mmHg MV Vmax:       1.06 m/s MV Vmean:      67.7 cm/s MV Decel Time: 162 msec MV E velocity: 106.00 cm/s MV A velocity: 60.20 cm/s MV E/A ratio:  1.76 Vishnu Priya Mallipeddi Electronically signed by Winfield Rast Mallipeddi Signature Date/Time: 12/09/2021/6:31:01 PM    Final         Scheduled Meds:  buPROPion ER  100 mg Oral BID   cariprazine  3 mg Oral Daily    clopidogrel  75 mg Oral Daily   furosemide  80 mg Intravenous BID   Gerhardt's butt cream  1 Application Topical BID   heparin  5,000 Units Subcutaneous Q8H   insulin aspart  0-20 Units Subcutaneous TID WC   insulin aspart  0-5 Units Subcutaneous QHS   insulin aspart  3 Units Subcutaneous TID WC   insulin glargine-yfgn  50 Units Subcutaneous QHS   lisinopril  10 mg Oral BID   melatonin  3 mg Oral QHS   metoprolol  tartrate  50 mg Oral BID   mupirocin ointment   Topical Daily   pantoprazole  40 mg Oral QHS   rosuvastatin  40 mg Oral QHS     LOS: 1 day    Time spent: 35 minutes    Lenette Rau Hoover Brunette, DO [facility]  If 7PM-7AM, please contact night-coverage [email or web address] 12/11/2021, 2:45 PM

## 2021-12-12 DIAGNOSIS — R0789 Other chest pain: Secondary | ICD-10-CM | POA: Diagnosis not present

## 2021-12-12 LAB — GLUCOSE, CAPILLARY
Glucose-Capillary: 333 mg/dL — ABNORMAL HIGH (ref 70–99)
Glucose-Capillary: 400 mg/dL — ABNORMAL HIGH (ref 70–99)
Glucose-Capillary: 309 mg/dL — ABNORMAL HIGH (ref 70–99)

## 2021-12-12 LAB — MAGNESIUM: Magnesium: 1.7 mg/dL (ref 1.7–2.4)

## 2021-12-12 LAB — BASIC METABOLIC PANEL
Anion gap: 10 (ref 5–15)
BUN: 37 mg/dL — ABNORMAL HIGH (ref 6–20)
CO2: 30 mmol/L (ref 22–32)
Calcium: 8.9 mg/dL (ref 8.9–10.3)
Chloride: 94 mmol/L — ABNORMAL LOW (ref 98–111)
Creatinine, Ser: 1.3 mg/dL — ABNORMAL HIGH (ref 0.44–1.00)
GFR, Estimated: 48 mL/min — ABNORMAL LOW (ref >60)
Glucose, Bld: 359 mg/dL — ABNORMAL HIGH (ref 70–99)
Potassium: 4.5 mmol/L (ref 3.5–5.1)
Sodium: 134 mmol/L — ABNORMAL LOW (ref 135–145)

## 2021-12-12 MED ORDER — METOPROLOL TARTRATE 25 MG PO TABS
25.0000 mg | ORAL_TABLET | Freq: Two times a day (BID) | ORAL | Status: DC
  Administered 2021-12-12 – 2021-12-17 (×9): 25 mg via ORAL
  Filled 2021-12-12 (×10): qty 1

## 2021-12-12 MED ORDER — LISINOPRIL 10 MG PO TABS: 20.0000 mg | ORAL_TABLET | Freq: Every day | ORAL | Status: DC

## 2021-12-12 MED ORDER — INSULIN ASPART 100 UNIT/ML IJ SOLN
8.0000 [IU] | Freq: Three times a day (TID) | INTRAMUSCULAR | Status: DC
  Administered 2021-12-12 – 2021-12-13 (×3): 8 [IU] via SUBCUTANEOUS

## 2021-12-12 NOTE — Plan of Care (Signed)
  Problem: Fluid Volume: Goal: Ability to maintain a balanced intake and output will improve Outcome: Not Progressing   Problem: Metabolic: Goal: Ability to maintain appropriate glucose levels will improve Outcome: Not Progressing   Problem: Tissue Perfusion: Goal: Adequacy of tissue perfusion will improve Outcome: Not Progressing   Problem: Skin Integrity: Goal: Risk for impaired skin integrity will decrease Outcome: Not Progressing

## 2021-12-12 NOTE — Progress Notes (Signed)
Patient Reds clip completed and charted under flowsheet. Patient provided imodium per order for loose stool. Patient has no complaints of pain. States shortness of breath is a little better. Took meds without difficulty. One of her pills unavailable through pharmacy at this time. Cream applied to patient's bottom.

## 2021-12-12 NOTE — Progress Notes (Signed)
PROGRESS NOTE    Nicole [name]  YBW:[id number] DOB: 04/13/1965 DOA: 12/08/2021 PCP: Benita Stabile, MD   Brief Narrative:  Nicole [name] is a 56 year old female with extensive history of HTN, HLD, DM 2, COPD, h/o diabetic foot, left BKA depression/anxiety, morbid obesity, CAD to stent to LAD, and 1 at RCA, with a recent left heart catheterization on 08/14/2021 -on Plavix and statin.  She presented with shortness of breath and has been admitted with dyspnea on exertion concerning for acute CHF exacerbation.  She has been started on IV diuresis and 2D echocardiogram pending.  Cardiology consulted.    Assessment & Plan:   Principal Problem:   Chest pain Active Problems:   Coronary artery disease involving native coronary artery of native heart with angina pectoris (HCC)   Dyslipidemia   COPD (chronic obstructive pulmonary disease) (HCC)   Primary hypertension   TIA (transient ischemic attack)   Morbid obesity with body mass index (BMI) of 50.0 to 59.9 in adult (HCC)   Anxiety and depression   Hx of BKA, left (HCC)   Uncontrolled type 2 diabetes mellitus with hyperglycemia, with long-term current use of insulin (HCC)   Acute on chronic heart failure with preserved ejection fraction (HCC)   OSA on CPAP   Non-cardiac chest pain   Acute diastolic CHF (congestive heart failure) (HCC)  Assessment and Plan:   Acute diastolic congestive heart failure (HCC) - History of diastolic congestive heart failure -2D echocardiogram with LVEF 65-70% -IV Lasix 80 mg BID per Cardiology recommendations, hold the evening dose as patient has already received morning dose with noted elevation in creatinine -Check strict I's and O's and daily weights -Appreciate cardiology involvement -Reds clip score 44% 10/21   Coronary artery disease involving native coronary artery of native heart with angina pectoris (HCC) - Continue Plavix, statins,  -Patient had a left heart catheterization in August 14, 2021 Minimum diffuse stenosis noted with 1 stent to RCA, and 2 stent to LAD, EJ EF 55-65% At the time recommendation for Plavix(allergic to aspirin), optimizing blood pressure control, LDL less than 70, tight control of DM 2   COPD (chronic obstructive pulmonary disease) (HCC) - Not O2 dependent, no signs of exacerbation -Monitoring closely, as needed DuoNeb bronchodilators   Dyslipidemia - Continue lovastatin  -LDL 40 on lipid panel    Hx of BKA, left (HCC) - Stump clean, no signs of infection   Anxiety and depression - Currently stable, continue as needed now benzodiazepines -Resuming home medication   Morbid obesity with body mass index (BMI) of 50.0 to 59.9 in adult (HCC) Body mass index is 53.14 kg/m. -Discussed with patient regarding her plan to follow-up with PCP: For strict diet exercise And referral to weight loss clinic -The benefit weight loss we discussed in detail--she expressed understanding   TIA (transient ischemic attack) - Stable continue home medications lovastatin and Plavix   HTN (hypertension), benign - Currently stable resuming home medication  -Discontinue lisinopril for now given elevation in creatinine and reduced dose of metoprolol given soft blood pressure readings   Uncontrolled type 2 diabetes mellitus with hyperglycemia, with long-term current use of insulin (HCC) - Hemoglobin A1c 9.5% -Hold home medications -Appreciate diabetes coordinator recommendations, increase Semglee to 35u due to hyperglycemia and continue mealtime NovoLog 3 units     DVT prophylaxis:Heparin Code Status: Full Family Communication: None at bedside Disposition Plan: Admit for diuresis Status is: Inpatient Remains inpatient appropriate because: Need for IV meds.  Nutritional Assessment:   The patient's BMI is: Body mass index is 53.14 kg/m.Marland Kitchen   Seen by dietician.  I agree with the assessment and plan as outlined below:   Consultants:  Cardiology    Procedures:  None   Antimicrobials:  None   Subjective: Patient seen and evaluated today with no new acute complaints or concerns. No acute concerns or events noted overnight.  She has scratched herself on her right leg and has been given dressing and mupirocin ointment.  She continues to have ongoing abdominal distention.  Objective: Vitals:   12/11/21 1141 12/11/21 2103 12/12/21 0031 12/12/21 0557  BP: 125/60 (!) 141/59  (!) 113/47  Pulse: 67 71 83 69  Resp: 20 20 20 20   Temp: 97.6 F (36.4 C) 98.2 F (36.8 C)  98.1 F (36.7 C)  TempSrc:      SpO2: 100% 100% 94% 95%  Weight:    136 kg  Height:        Intake/Output Summary (Last 24 hours) at 12/12/2021 0949 Last data filed at 12/12/2021 0600 Gross per 24 hour  Intake --  Output 2500 ml  Net -2500 ml   Filed Weights   12/10/21 0605 12/11/21 0544 12/12/21 0557  Weight: (!) 140 kg (!) 137.9 kg 136 kg    Examination:  General exam: Appears calm and comfortable, obese Respiratory system: Clear to auscultation. Respiratory effort normal. Cardiovascular system: S1 & S2 heard, RRR.  Gastrointestinal system: Abdomen is soft Central nervous system: Alert and awake Extremities: No edema, left BKA Skin: No significant lesions noted Psychiatry: Flat affect.    Data Reviewed: I have personally reviewed following labs and imaging studies  CBC: Recent Labs  Lab 12/08/21 0913 12/08/21 1435  WBC 9.9 10.5  NEUTROABS 6.5  --   HGB 10.6* 10.6*  HCT 33.7* 33.2*  MCV 98.5 97.4  PLT 181 154   Basic Metabolic Panel: Recent Labs  Lab 12/08/21 0913 12/08/21 1435 12/09/21 0539 12/10/21 0440 12/11/21 0538 12/12/21 0659  NA 141  --  137 138 138 134*  K 3.9  --  3.7 3.6 4.0 4.5  CL 105  --  101 98 96* 94*  CO2 28  --  30 31 31 30   GLUCOSE 323*  --  257* 287* 284* 359*  BUN 25*  --  22* 25* 32* 37*  CREATININE 0.83 0.93 0.82 0.95 1.10* 1.30*  CALCIUM 8.2*  --  8.4* 8.8* 9.0 8.9  MG  --   --   --  1.4* 1.6* 1.7    GFR: Estimated Creatinine Clearance: 64.7 mL/min (A) (by C-G formula based on SCr of 1.3 mg/dL (H)). Liver Function Tests: Recent Labs  Lab 12/08/21 0913  AST 26  ALT 19  ALKPHOS 79  BILITOT 0.4  PROT 6.9  ALBUMIN 3.2*   No results for input(s): "LIPASE", "AMYLASE" in the last 168 hours. No results for input(s): "AMMONIA" in the last 168 hours. Coagulation Profile: No results for input(s): "INR", "PROTIME" in the last 168 hours. Cardiac Enzymes: No results for input(s): "CKTOTAL", "CKMB", "CKMBINDEX", "TROPONINI" in the last 168 hours. BNP (last 3 results) No results for input(s): "PROBNP" in the last 8760 hours. HbA1C: No results for input(s): "HGBA1C" in the last 72 hours. CBG: Recent Labs  Lab 12/10/21 2147 12/11/21 0753 12/11/21 1108 12/11/21 1621 12/12/21 0732  GLUCAP 280* 290* 313* 312* 333*   Lipid Profile: No results for input(s): "CHOL", "HDL", "LDLCALC", "TRIG", "CHOLHDL", "LDLDIRECT" in the last 72 hours. Thyroid  Function Tests: No results for input(s): "TSH", "T4TOTAL", "FREET4", "T3FREE", "THYROIDAB" in the last 72 hours. Anemia Panel: No results for input(s): "VITAMINB12", "FOLATE", "FERRITIN", "TIBC", "IRON", "RETICCTPCT" in the last 72 hours. Sepsis Labs: No results for input(s): "PROCALCITON", "LATICACIDVEN" in the last 168 hours.  No results found for this or any previous visit (from the past 240 hour(s)).       Radiology Studies: No results found.      Scheduled Meds:  buPROPion ER  100 mg Oral BID   cariprazine  3 mg Oral Daily   clopidogrel  75 mg Oral Daily   Gerhardt's butt cream  1 Application Topical BID   heparin  5,000 Units Subcutaneous Q8H   insulin aspart  0-20 Units Subcutaneous TID WC   insulin aspart  0-5 Units Subcutaneous QHS   insulin aspart  3 Units Subcutaneous TID WC   insulin glargine-yfgn  50 Units Subcutaneous QHS   melatonin  3 mg Oral QHS   metoprolol tartrate  25 mg Oral BID   mupirocin ointment    Topical Daily   pantoprazole  40 mg Oral QHS   rosuvastatin  40 mg Oral QHS     LOS: 2 days    Time spent: 35 minutes    Eiliana Drone Hoover Brunette, DO [facility]  If 7PM-7AM, please contact night-coverage [email or web address] 12/12/2021, 9:49 AM

## 2021-12-12 NOTE — Progress Notes (Signed)
  Transition of Care [facility]) Screening Note   Patient Details  Name: Nicole [name] Date of Birth: 01-25-1966   Transition of Care [facility]) CM/SW Contact:    Kimber Relic, LCSW Phone Number: 12/12/2021, 3:15 PM    Transition of Care Department [facility]) has reviewed patient and no TOC needs have been identified at this time. We will continue to monitor patient advancement through interdisciplinary progression rounds. If new patient transition needs arise, please place a TOC consult.

## 2021-12-12 NOTE — Progress Notes (Signed)
CBG obtained by Wynonia Musty NT and results did not cross over. POC form filled ou by NT.  Cbg result is 324. See MAR for insulin admin. Bryson Corona Edd Fabian

## 2021-12-13 DIAGNOSIS — E1165 Type 2 diabetes mellitus with hyperglycemia: Secondary | ICD-10-CM | POA: Diagnosis not present

## 2021-12-13 DIAGNOSIS — R0789 Other chest pain: Secondary | ICD-10-CM | POA: Diagnosis not present

## 2021-12-13 LAB — CBC
HCT: 31.7 % — ABNORMAL LOW (ref 36.0–46.0)
Hemoglobin: 10.6 g/dL — ABNORMAL LOW (ref 12.0–15.0)
MCH: 31.5 pg (ref 26.0–34.0)
MCHC: 33.4 g/dL (ref 30.0–36.0)
MCV: 94.3 fL (ref 80.0–100.0)
Platelets: 181 10*3/uL (ref 150–400)
RBC: 3.36 MIL/uL — ABNORMAL LOW (ref 3.87–5.11)
RDW: 14.2 % (ref 11.5–15.5)
WBC: 10.9 10*3/uL — ABNORMAL HIGH (ref 4.0–10.5)
nRBC: 0 % (ref 0.0–0.2)

## 2021-12-13 LAB — GLUCOSE, CAPILLARY
Glucose-Capillary: 257 mg/dL — ABNORMAL HIGH (ref 70–99)
Glucose-Capillary: 424 mg/dL — ABNORMAL HIGH (ref 70–99)
Glucose-Capillary: 309 mg/dL — ABNORMAL HIGH (ref 70–99)
Glucose-Capillary: 320 mg/dL — ABNORMAL HIGH (ref 70–99)

## 2021-12-13 LAB — BASIC METABOLIC PANEL
Anion gap: 10 (ref 5–15)
BUN: 37 mg/dL — ABNORMAL HIGH (ref 6–20)
CO2: 31 mmol/L (ref 22–32)
Calcium: 9.2 mg/dL (ref 8.9–10.3)
Chloride: 97 mmol/L — ABNORMAL LOW (ref 98–111)
Creatinine, Ser: 1.19 mg/dL — ABNORMAL HIGH (ref 0.44–1.00)
GFR, Estimated: 53 mL/min — ABNORMAL LOW (ref >60)
Glucose, Bld: 272 mg/dL — ABNORMAL HIGH (ref 70–99)
Potassium: 4 mmol/L (ref 3.5–5.1)
Sodium: 138 mmol/L (ref 135–145)

## 2021-12-13 LAB — MAGNESIUM: Magnesium: 1.8 mg/dL (ref 1.7–2.4)

## 2021-12-13 MED ORDER — INSULIN GLARGINE-YFGN 100 UNIT/ML ~~LOC~~ SOLN
55.0000 [IU] | Freq: Every day | SUBCUTANEOUS | Status: DC
  Administered 2021-12-13: 55 [IU] via SUBCUTANEOUS
  Filled 2021-12-13 (×2): qty 0.55

## 2021-12-13 MED ORDER — INSULIN ASPART 100 UNIT/ML IJ SOLN
10.0000 [IU] | Freq: Three times a day (TID) | INTRAMUSCULAR | Status: DC
  Administered 2021-12-13 – 2021-12-14 (×3): 10 [IU] via SUBCUTANEOUS

## 2021-12-13 MED ORDER — FUROSEMIDE 10 MG/ML IJ SOLN
80.0000 mg | Freq: Two times a day (BID) | INTRAMUSCULAR | Status: DC
  Administered 2021-12-13 – 2021-12-16 (×8): 80 mg via INTRAVENOUS
  Filled 2021-12-13 (×8): qty 8

## 2021-12-13 NOTE — Progress Notes (Signed)
Overall, patient had an uneventful night. She is able to transfer self from bed to wheelchair to  bedside commode without hands on assist. She reports her shortness of breath is improving. At times throughout night she took off nasal cannula but maintained adequate SPO2. She c/o R lower leg pain from Abrasion/ tear from her scratching. This RN removed dressing, cleansed wound, and applied new dressing. She requested PRN Tylenol and Ambien and slept until labs this AM. Weight has improved this AM from 136kg yesterday to 130.2kg today. Bryson Corona Edd Fabian

## 2021-12-13 NOTE — Progress Notes (Signed)
PROGRESS NOTE    Nicole [name]  ALP:[id number] DOB: Apr 09, 1965 DOA: 12/08/2021 PCP: Celene Squibb, MD   Brief Narrative:  Nicole [name] is a 56 year old female with extensive history of HTN, HLD, DM 2, COPD, h/o diabetic foot, left BKA depression/anxiety, morbid obesity, CAD to stent to LAD, and 1 at RCA, with a recent left heart catheterization on 08/14/2021 -on Plavix and statin.  She presented with shortness of breath and has been admitted with dyspnea on exertion concerning for acute CHF exacerbation.  She has been started on IV diuresis and 2D echocardiogram pending.  Cardiology consulted.    Assessment & Plan:   Principal Problem:   Chest pain Active Problems:   Coronary artery disease involving native coronary artery of native heart with angina pectoris (HCC)   Dyslipidemia   COPD (chronic obstructive pulmonary disease) (HCC)   Primary hypertension   TIA (transient ischemic attack)   Morbid obesity with body mass index (BMI) of 50.0 to 59.9 in adult (HCC)   Anxiety and depression   Hx of BKA, left (HCC)   Uncontrolled type 2 diabetes mellitus with hyperglycemia, with long-term current use of insulin (HCC)   Acute on chronic heart failure with preserved ejection fraction (HCC)   OSA on CPAP   Non-cardiac chest pain   Acute diastolic CHF (congestive heart failure) (HCC)  Assessment and Plan:   Acute diastolic congestive heart failure (HCC) - History of diastolic congestive heart failure -2D echocardiogram with LVEF 65-70% -IV Lasix 80 mg BID per Cardiology recommendations, plan to resume today and continue close monitoring -Check strict I's and O's and daily weights -Appreciate cardiology involvement -Reds clip score 44% 10/21   Coronary artery disease involving native coronary artery of native heart with angina pectoris (HCC) - Continue Plavix, statins,  -Patient had a left heart catheterization in August 14, 2021 Minimum diffuse stenosis noted with 1 stent to RCA,  and 2 stent to LAD, EJ EF 55-65% At the time recommendation for Plavix(allergic to aspirin), optimizing blood pressure control, LDL less than 70, tight control of DM 2   COPD (chronic obstructive pulmonary disease) (HCC) with chronic hypoxemia -Wears 4 L nasal cannula at home -Monitoring closely, as needed DuoNeb bronchodilators   Dyslipidemia - Continue lovastatin  -LDL 40 on lipid panel    Hx of BKA, left (HCC) - Stump clean, no signs of infection   Anxiety and depression - Currently stable, continue as needed now benzodiazepines -Resuming home medication   Morbid obesity with body mass index (BMI) of 50.0 to 59.9 in adult (HCC) Body mass index is 53.14 kg/m. -Discussed with patient regarding her plan to follow-up with PCP: For strict diet exercise And referral to weight loss clinic -The benefit weight loss we discussed in detail--she expressed understanding   TIA (transient ischemic attack) - Stable continue home medications lovastatin and Plavix   HTN (hypertension), benign - Currently stable resuming home medication  -Discontinue lisinopril for now given elevation in creatinine and reduced dose of metoprolol given soft blood pressure readings   Uncontrolled type 2 diabetes mellitus with hyperglycemia, with long-term current use of insulin (HCC) - Hemoglobin A1c 9.5% -Hold home medications -Appreciate diabetes coordinator recommendations, increase Semglee to 35u due to hyperglycemia and continue mealtime NovoLog 3 units     DVT prophylaxis:Heparin Code Status: Full Family Communication: None at bedside Disposition Plan: Admit for diuresis Status is: Inpatient Remains inpatient appropriate because: Need for IV meds.       Nutritional Assessment:  The patient's BMI is: Body mass index is 53.14 kg/m.Marland Kitchen   Seen by dietician.  I agree with the assessment and plan as outlined below:   Consultants:  Cardiology   Procedures:  None   Antimicrobials:  None    Subjective: Patient seen and evaluated today with no new acute complaints or concerns. No acute concerns or events noted overnight.  Objective: Vitals:   12/12/21 2114 12/13/21 0052 12/13/21 0623 12/13/21 0629  BP: (!) 176/58 (!) 154/45 (!) 131/44   Pulse: 75 75 71   Resp: 20 18 20    Temp: 98.2 F (36.8 C)  98 F (36.7 C)   TempSrc: Oral  Oral   SpO2: 97% 95% 100%   Weight:    130.2 kg  Height:        Intake/Output Summary (Last 24 hours) at 12/13/2021 1039 Last data filed at 12/13/2021 1030 Gross per 24 hour  Intake 360 ml  Output 1850 ml  Net -1490 ml   Filed Weights   12/11/21 0544 12/12/21 0557 12/13/21 0629  Weight: (!) 137.9 kg 136 kg 130.2 kg    Examination:  General exam: Appears calm and comfortable, obese Respiratory system: Clear to auscultation. Respiratory effort normal.  4 L nasal cannula oxygen Cardiovascular system: S1 & S2 heard, RRR.  Gastrointestinal system: Abdomen is soft Central nervous system: Alert and awake Extremities: No edema, left BKA Skin: No significant lesions noted Psychiatry: Flat affect.    Data Reviewed: I have personally reviewed following labs and imaging studies  CBC: Recent Labs  Lab 12/08/21 0913 12/08/21 1435 12/13/21 0557  WBC 9.9 10.5 10.9*  NEUTROABS 6.5  --   --   HGB 10.6* 10.6* 10.6*  HCT 33.7* 33.2* 31.7*  MCV 98.5 97.4 94.3  PLT 181 190 181   Basic Metabolic Panel: Recent Labs  Lab 12/09/21 0539 12/10/21 0440 12/11/21 0538 12/12/21 0659 12/13/21 0557  NA 137 138 138 134* 138  K 3.7 3.6 4.0 4.5 4.0  CL 101 98 96* 94* 97*  CO2 30 31 31 30 31   GLUCOSE 257* 287* 284* 359* 272*  BUN 22* 25* 32* 37* 37*  CREATININE 0.82 0.95 1.10* 1.30* 1.19*  CALCIUM 8.4* 8.8* 9.0 8.9 9.2  MG  --  1.4* 1.6* 1.7 1.8   GFR: Estimated Creatinine Clearance: 68.8 mL/min (A) (by C-G formula based on SCr of 1.19 mg/dL (H)). Liver Function Tests: Recent Labs  Lab 12/08/21 0913  AST 26  ALT 19  ALKPHOS 79   BILITOT 0.4  PROT 6.9  ALBUMIN 3.2*   No results for input(s): "LIPASE", "AMYLASE" in the last 168 hours. No results for input(s): "AMMONIA" in the last 168 hours. Coagulation Profile: No results for input(s): "INR", "PROTIME" in the last 168 hours. Cardiac Enzymes: No results for input(s): "CKTOTAL", "CKMB", "CKMBINDEX", "TROPONINI" in the last 168 hours. BNP (last 3 results) No results for input(s): "PROBNP" in the last 8760 hours. HbA1C: No results for input(s): "HGBA1C" in the last 72 hours. CBG: Recent Labs  Lab 12/11/21 1621 12/12/21 0732 12/12/21 1204 12/12/21 1653 12/13/21 0725  GLUCAP 312* 333* 400* 309* 257*   Lipid Profile: No results for input(s): "CHOL", "HDL", "LDLCALC", "TRIG", "CHOLHDL", "LDLDIRECT" in the last 72 hours. Thyroid Function Tests: No results for input(s): "TSH", "T4TOTAL", "FREET4", "T3FREE", "THYROIDAB" in the last 72 hours. Anemia Panel: No results for input(s): "VITAMINB12", "FOLATE", "FERRITIN", "TIBC", "IRON", "RETICCTPCT" in the last 72 hours. Sepsis Labs: No results for input(s): "PROCALCITON", "LATICACIDVEN" in the last  168 hours.  No results found for this or any previous visit (from the past 240 hour(s)).       Radiology Studies: No results found.      Scheduled Meds:  buPROPion ER  100 mg Oral BID   cariprazine  3 mg Oral Daily   clopidogrel  75 mg Oral Daily   furosemide  80 mg Intravenous Q12H   Gerhardt's butt cream  1 Application Topical BID   heparin  5,000 Units Subcutaneous Q8H   insulin aspart  0-20 Units Subcutaneous TID WC   insulin aspart  0-5 Units Subcutaneous QHS   insulin aspart  10 Units Subcutaneous TID WC   insulin glargine-yfgn  55 Units Subcutaneous QHS   melatonin  3 mg Oral QHS   metoprolol tartrate  25 mg Oral BID   pantoprazole  40 mg Oral QHS   rosuvastatin  40 mg Oral QHS     LOS: 3 days    Time spent: 35 minutes    Airika Alkhatib Hoover Brunette, DO [facility]  If 7PM-7AM, please  contact night-coverage [email or web address] 12/13/2021, 10:39 AM

## 2021-12-14 DIAGNOSIS — R0789 Other chest pain: Secondary | ICD-10-CM | POA: Diagnosis not present

## 2021-12-14 DIAGNOSIS — I5033 Acute on chronic diastolic (congestive) heart failure: Secondary | ICD-10-CM | POA: Diagnosis not present

## 2021-12-14 LAB — BASIC METABOLIC PANEL
Anion gap: 12 (ref 5–15)
BUN: 33 mg/dL — ABNORMAL HIGH (ref 6–20)
CO2: 31 mmol/L (ref 22–32)
Calcium: 9.2 mg/dL (ref 8.9–10.3)
Chloride: 94 mmol/L — ABNORMAL LOW (ref 98–111)
Creatinine, Ser: 1.09 mg/dL — ABNORMAL HIGH (ref 0.44–1.00)
GFR, Estimated: 59 mL/min — ABNORMAL LOW (ref >60)
Glucose, Bld: 278 mg/dL — ABNORMAL HIGH (ref 70–99)
Potassium: 3.9 mmol/L (ref 3.5–5.1)
Sodium: 137 mmol/L (ref 135–145)

## 2021-12-14 LAB — GLUCOSE, CAPILLARY
Glucose-Capillary: 314 mg/dL — ABNORMAL HIGH (ref 70–99)
Glucose-Capillary: 242 mg/dL — ABNORMAL HIGH (ref 70–99)

## 2021-12-14 LAB — MAGNESIUM: Magnesium: 1.8 mg/dL (ref 1.7–2.4)

## 2021-12-14 MED ORDER — POTASSIUM CHLORIDE CRYS ER 20 MEQ PO TBCR
20.0000 meq | EXTENDED_RELEASE_TABLET | Freq: Every day | ORAL | Status: DC
  Administered 2021-12-14 – 2021-12-17 (×4): 20 meq via ORAL
  Filled 2021-12-14 (×4): qty 1

## 2021-12-14 MED ORDER — MAGNESIUM SULFATE 2 GM/50ML IV SOLN
2.0000 g | Freq: Once | INTRAVENOUS | Status: AC
  Administered 2021-12-14: 2 g via INTRAVENOUS
  Filled 2021-12-14: qty 50

## 2021-12-14 MED ORDER — INSULIN REGULAR HUMAN (CONC) 500 UNIT/ML ~~LOC~~ SOPN
50.0000 [IU] | PEN_INJECTOR | Freq: Three times a day (TID) | SUBCUTANEOUS | Status: DC
  Administered 2021-12-14 – 2021-12-17 (×9): 50 [IU] via SUBCUTANEOUS
  Filled 2021-12-14: qty 3

## 2021-12-14 NOTE — Progress Notes (Signed)
Patient is on her home CPAP, sleeping comfortably at this time.

## 2021-12-14 NOTE — Progress Notes (Addendum)
Progress Note  Patient Name: Nicole [name] Date of Encounter: 12/14/2021  Primary Cardiologist: Dorris Carnes, MD  Subjective   Starting to feel a little better. No CP. Breathing improved. Some swelling persists.  Inpatient Medications    Scheduled Meds:  buPROPion ER  100 mg Oral BID   cariprazine  3 mg Oral Daily   clopidogrel  75 mg Oral Daily   furosemide  80 mg Intravenous Q12H   Gerhardt's butt cream  1 Application Topical BID   heparin  5,000 Units Subcutaneous Q8H   insulin aspart  0-20 Units Subcutaneous TID WC   insulin aspart  0-5 Units Subcutaneous QHS   insulin aspart  10 Units Subcutaneous TID WC   insulin glargine-yfgn  55 Units Subcutaneous QHS   melatonin  3 mg Oral QHS   metoprolol tartrate  25 mg Oral BID   pantoprazole  40 mg Oral QHS   rosuvastatin  40 mg Oral QHS   Continuous Infusions:  PRN Meds: acetaminophen, albuterol, ALPRAZolam, hydrALAZINE, loperamide, nitroGLYCERIN, nystatin, ondansetron (ZOFRAN) IV, zolpidem   Vital Signs    Vitals:   12/13/21 2202 12/14/21 0549 12/14/21 0553 12/14/21 0854  BP: (!) 134/47  (!) 156/59 (!) 153/69  Pulse: 65  77 74  Resp:   20   Temp:   98 F (36.7 C)   TempSrc:   Oral   SpO2:   98%   Weight:  134.9 kg    Height:        Intake/Output Summary (Last 24 hours) at 12/14/2021 0948 Last data filed at 12/14/2021 [id number] Gross per 24 hour  Intake 480 ml  Output 3450 ml  Net -2970 ml      12/14/2021    5:49 AM 12/13/2021    6:29 AM 12/12/2021    5:57 AM  Last 3 Weights  Weight (lbs) 297 lb 6.4 oz 287 lb 0.6 oz 299 lb 13.2 oz  Weight (kg) 134.9 kg 130.2 kg 136 kg     Telemetry    NSR, occasional PVCs, 4 beat run NSVT - Personally Reviewed  Physical Exam   GEN: No acute distress. Morbidly obese HEENT: Normocephalic, atraumatic, sclera non-icteric. Neck: No JVD or bruits. Cardiac: RRR no murmurs, rubs, or gallops.  Respiratory: Diminished throughout but no wheezing, rales or rhonchi. Breathing  is unlabored. GI: Soft, nontender, non-distended, BS +x 4. MS: no deformity. Extremities: s/p L BKA. 1+ pitting edema RLE. Neuro:  AAOx3. Follows commands. Psych:  Responds to questions appropriately with a normal affect.  Labs    High Sensitivity Troponin:   Recent Labs  Lab 12/08/21 0913 12/08/21 1105 12/08/21 1435 12/08/21 1644  TROPONINIHS 15 14 13 12       Cardiac EnzymesNo results for input(s): "TROPONINI" in the last 168 hours. No results for input(s): "TROPIPOC" in the last 168 hours.   Chemistry Recent Labs  Lab 12/08/21 0913 12/08/21 1435 12/12/21 0659 12/13/21 0557 12/14/21 0524  NA 141   < > 134* 138 137  K 3.9   < > 4.5 4.0 3.9  CL 105   < > 94* 97* 94*  CO2 28   < > 30 31 31   GLUCOSE 323*   < > 359* 272* 278*  BUN 25*   < > 37* 37* 33*  CREATININE 0.83   < > 1.30* 1.19* 1.09*  CALCIUM 8.2*   < > 8.9 9.2 9.2  PROT 6.9  --   --   --   --   ALBUMIN  3.2*  --   --   --   --   AST 26  --   --   --   --   ALT 19  --   --   --   --   ALKPHOS 79  --   --   --   --   BILITOT 0.4  --   --   --   --   GFRNONAA >60   < > 48* 53* 59*  ANIONGAP 8   < > 10 10 12    < > = values in this interval not displayed.     Hematology Recent Labs  Lab 12/08/21 0913 12/08/21 1435 12/13/21 0557  WBC 9.9 10.5 10.9*  RBC 3.42* 3.41* 3.36*  HGB 10.6* 10.6* 10.6*  HCT 33.7* 33.2* 31.7*  MCV 98.5 97.4 94.3  MCH 31.0 31.1 31.5  MCHC 31.5 31.9 33.4  RDW 14.2 14.3 14.2  PLT 181 190 181    BNP Recent Labs  Lab 12/08/21 0916  BNP 80.0     DDimer No results for input(s): "DDIMER" in the last 168 hours.   Radiology    No results found.  Cardiac Studies   2d echo 12/09/21  1. Left ventricular ejection fraction, by estimation, is 65 to 70%. The  left ventricle has hyperdynamic function. The left ventricle has no  regional wall motion abnormalities. Left ventricular diastolic parameters  are indeterminate.   2. Right ventricular systolic function was not well  visualized. The right  ventricular size is not well visualized.   3. The mitral valve was not well visualized. No evidence of mitral valve  regurgitation. No evidence of mitral stenosis.   4. The aortic valve is calcified. Aortic valve regurgitation is not  visualized. No aortic stenosis is present.   Patient Profile     56 y.o. female with CAD (s/p stenting in 2009, low-risk NST in 2013, reported 2 more stents in [place] in 2020 and history of false negative stress tests, cath in 07/2021 showing patent stents along the RCA and mid LAD with only mild nonobstructive disease), ASA allergy, paroxysmal atrial fibrillation (isolated episode in 09/2019 and not started on chronic anticoagulation), HTN, HLD, PAD (s/p L BKA), COPD with chronic respiratory failure on 4L [place] home O2, super morbid obesity, OSA on CPAP, TIA, anxiety, depression, and Type 2 DM. Presented to the hospital with worsening CP/SOB, edema, and abdominal distention felt due to CHF.  Assessment & Plan    1. Acute on chronic HFpEF, essential HTN - peak weight 308 -> 297 today (? 287 yesterday), -8.4L UOP - she is unsure of dry weight - RLE pitting edema persists so would continue diuresis with relatively stable BUN/Cr, pending review with MD - otherwise currently on metoprolol 25mg  BID - reasonable to continue selective BB in setting of COPD, but may benefit from additional antihypertensive control given SBPs 140s-150s at times though need to be cognizant that diastolics drop into [id number] at times  2. CKD stage 2-3a by labs - baseline looks 0.8-1.2, follow in context above   3. CAD - on Plavix PTA due to ASA allergy, recent cath as above - hsTroponins negative this admission, sx felt more due to CHF - continue BB, statin - LDL 43 in 07/2021, trigs 217 - will update lipid panel in AM to reassess trigs  4. Anemia - chronic appearing  5. Obesity - would benefit from obesity medicine referral in follow-up  6. PVCs/brief NSVT - K  3.9,  Mg 1.8 -> start KCl 67meq daily while on IV Lasix, add Mag sulfate 2g today and f/u lytes in AM   For questions or updates, please contact [place] Please consult [email or web address] for contact info under Cardiology/STEMI.  Signed, Charlie Pitter, PA-C 12/14/2021, 9:48 AM   Attending note Patient seen and disucssed with PA Dunn, I agree with her docuementation  1.Acute on chronic HFpEF - 11/2021 echo LVEF 33-35%, indet diastolic, RV not well visualized. BNP 80 - CXR prominent pulml vasculature, right basilar patchy opacities - 11/2021 CT chest: no acute process - reds vest yesterday 44%  - negative 2.5 L yesterday, neg 8.2 L since admission. She is on IV lasix 80mg  bid, last few Cr values trending down consistent with venous congestion and HF - likely add SGLT2i this admit in setting of HFpEF - diuresing well, remains fluid overloaded and will continue IV lasix.  2. CAD - history of prior stenting - 07/2021 cath: mild nonobstructive disease -no acute issues this admssion  Carlyle Dolly MD

## 2021-12-14 NOTE — Progress Notes (Signed)
Patient a/ox4. Patient ambulatory to [facility] with her wheelchair. Patient is strict I&O. IV lasix given this shift and patient has had good urine output. Standing weight of 134.9 kg this morning. Pain has a skin tear to her R lower leg. New dressing applied. Patient given tylenol for pain.

## 2021-12-14 NOTE — Progress Notes (Signed)
PROGRESS NOTE    Nicole [name]  CHE:[id number] DOB: 1965-07-26 DOA: 12/08/2021 PCP: Benita Stabile, MD   Brief Narrative:  Nicole [name] is a 56 year old female with extensive history of HTN, HLD, DM 2, COPD, h/o diabetic foot, left BKA depression/anxiety, morbid obesity, CAD to stent to LAD, and 1 at RCA, with a recent left heart catheterization on 08/14/2021 -on Plavix and statin.  She presented with shortness of breath and has been admitted with dyspnea on exertion concerning for acute CHF exacerbation.  She has been started on IV diuresis and 2D echocardiogram pending.  Cardiology consulted.    Assessment & Plan:   Principal Problem:   Chest pain Active Problems:   Coronary artery disease involving native coronary artery of native heart with angina pectoris (HCC)   Dyslipidemia   COPD (chronic obstructive pulmonary disease) (HCC)   Primary hypertension   TIA (transient ischemic attack)   Morbid obesity with body mass index (BMI) of 50.0 to 59.9 in adult (HCC)   Anxiety and depression   Hx of BKA, left (HCC)   Uncontrolled type 2 diabetes mellitus with hyperglycemia, with long-term current use of insulin (HCC)   Acute on chronic heart failure with preserved ejection fraction (HCC)   OSA on CPAP   Non-cardiac chest pain   Acute diastolic CHF (congestive heart failure) (HCC)  Assessment and Plan:   Acute diastolic congestive heart failure (HCC) - History of diastolic congestive heart failure -2D echocardiogram with LVEF 65-70% -IV Lasix 80 mg BID per Cardiology recommendations, with potassium supplementation -Check strict I's and O's and daily weights -Appreciate cardiology involvement -Reds clip score 44% 10/21 -Foley catheter to be placed for ease and monitoring strict I's and O's   Coronary artery disease involving native coronary artery of native heart with angina pectoris (HCC) - Continue Plavix, statins,  -Patient had a left heart catheterization in August 14, 2021 Minimum diffuse stenosis noted with 1 stent to RCA, and 2 stent to LAD, EJ EF 55-65% At the time recommendation for Plavix(allergic to aspirin), optimizing blood pressure control, LDL less than 70, tight control of DM 2   COPD (chronic obstructive pulmonary disease) (HCC) with chronic hypoxemia -Wears 4 L nasal cannula at home -Monitoring closely, as needed DuoNeb bronchodilators   Dyslipidemia - Continue lovastatin  -LDL 40 on lipid panel    Hx of BKA, left (HCC) - Stump clean, no signs of infection   Anxiety and depression - Currently stable, continue as needed now benzodiazepines -Resuming home medication   Morbid obesity with body mass index (BMI) of 50.0 to 59.9 in adult (HCC) Body mass index is 53.14 kg/m. -Discussed with patient regarding her plan to follow-up with PCP: For strict diet exercise And referral to weight loss clinic -The benefit weight loss we discussed in detail--she expressed understanding   TIA (transient ischemic attack) - Stable continue home medications lovastatin and Plavix   HTN (hypertension), benign - Currently stable resuming home medication  -Discontinue lisinopril for now given elevation in creatinine and reduced dose of metoprolol given soft blood pressure readings   Uncontrolled type 2 diabetes mellitus with hyperglycemia, with long-term current use of insulin (HCC) - Hemoglobin A1c 9.5% -Insulin 3 times daily at 50 units per diabetes coordinator recommendations     DVT prophylaxis:Heparin Code Status: Full Family Communication: None at bedside Disposition Plan: Admit for diuresis Status is: Inpatient Remains inpatient appropriate because: Need for IV meds.     Nutritional Assessment:   The patient's  BMI is: Body mass index is 53.14 kg/m.Marland Kitchen   Seen by dietician.  I agree with the assessment and plan as outlined below:   Consultants:  Cardiology   Procedures:  None   Antimicrobials:  None    Subjective: Patient seen  and evaluated today with improvements in shortness of breath noted, but she continues to have ongoing swelling.  Objective: Vitals:   12/13/21 2202 12/14/21 0549 12/14/21 0553 12/14/21 0854  BP: (!) 134/47  (!) 156/59 (!) 153/69  Pulse: 65  77 74  Resp:   20   Temp:   98 F (36.7 C)   TempSrc:   Oral   SpO2:   98%   Weight:  134.9 kg    Height:        Intake/Output Summary (Last 24 hours) at 12/14/2021 1140 Last data filed at 12/14/2021 1131 Gross per 24 hour  Intake 480 ml  Output 3600 ml  Net -3120 ml   Filed Weights   12/12/21 0557 12/13/21 0629 12/14/21 0549  Weight: 136 kg 130.2 kg 134.9 kg    Examination:  General exam: Appears calm and comfortable, morbidly obese Respiratory system: Clear to auscultation. Respiratory effort normal.  Nasal cannula Cardiovascular system: S1 & S2 heard, RRR.  Gastrointestinal system: Abdomen is soft Central nervous system: Alert and awake Extremities: Left BKA, right lower extremity dressing clean dry and intact with some ongoing swelling Skin: No significant lesions noted Psychiatry: Flat affect.    Data Reviewed: I have personally reviewed following labs and imaging studies  CBC: Recent Labs  Lab 12/08/21 0913 12/08/21 1435 12/13/21 0557  WBC 9.9 10.5 10.9*  NEUTROABS 6.5  --   --   HGB 10.6* 10.6* 10.6*  HCT 33.7* 33.2* 31.7*  MCV 98.5 97.4 94.3  PLT 181 190 673   Basic Metabolic Panel: Recent Labs  Lab 12/10/21 0440 12/11/21 0538 12/12/21 0659 12/13/21 0557 12/14/21 0524  NA 138 138 134* 138 137  K 3.6 4.0 4.5 4.0 3.9  CL 98 96* 94* 97* 94*  CO2 31 31 30 31 31   GLUCOSE 287* 284* 359* 272* 278*  BUN 25* 32* 37* 37* 33*  CREATININE 0.95 1.10* 1.30* 1.19* 1.09*  CALCIUM 8.8* 9.0 8.9 9.2 9.2  MG 1.4* 1.6* 1.7 1.8 1.8   GFR: Estimated Creatinine Clearance: 76.8 mL/min (A) (by C-G formula based on SCr of 1.09 mg/dL (H)). Liver Function Tests: Recent Labs  Lab 12/08/21 0913  AST 26  ALT 19  ALKPHOS 79   BILITOT 0.4  PROT 6.9  ALBUMIN 3.2*   No results for input(s): "LIPASE", "AMYLASE" in the last 168 hours. No results for input(s): "AMMONIA" in the last 168 hours. Coagulation Profile: No results for input(s): "INR", "PROTIME" in the last 168 hours. Cardiac Enzymes: No results for input(s): "CKTOTAL", "CKMB", "CKMBINDEX", "TROPONINI" in the last 168 hours. BNP (last 3 results) No results for input(s): "PROBNP" in the last 8760 hours. HbA1C: No results for input(s): "HGBA1C" in the last 72 hours. CBG: Recent Labs  Lab 12/13/21 0725 12/13/21 1101 12/13/21 1659 12/13/21 2148 12/14/21 0730  GLUCAP 257* 424* 309* 320* 314*   Lipid Profile: No results for input(s): "CHOL", "HDL", "LDLCALC", "TRIG", "CHOLHDL", "LDLDIRECT" in the last 72 hours. Thyroid Function Tests: No results for input(s): "TSH", "T4TOTAL", "FREET4", "T3FREE", "THYROIDAB" in the last 72 hours. Anemia Panel: No results for input(s): "VITAMINB12", "FOLATE", "FERRITIN", "TIBC", "IRON", "RETICCTPCT" in the last 72 hours. Sepsis Labs: No results for input(s): "PROCALCITON", "LATICACIDVEN" in the  last 168 hours.  No results found for this or any previous visit (from the past 240 hour(s)).       Radiology Studies: No results found.      Scheduled Meds:  buPROPion ER  100 mg Oral BID   cariprazine  3 mg Oral Daily   clopidogrel  75 mg Oral Daily   furosemide  80 mg Intravenous Q12H   Gerhardt's butt cream  1 Application Topical BID   heparin  5,000 Units Subcutaneous Q8H   insulin aspart  0-20 Units Subcutaneous TID WC   insulin aspart  0-5 Units Subcutaneous QHS   insulin regular human CONCENTRATED  50 Units Subcutaneous TID WC   melatonin  3 mg Oral QHS   metoprolol tartrate  25 mg Oral BID   pantoprazole  40 mg Oral QHS   potassium chloride  20 mEq Oral Daily   rosuvastatin  40 mg Oral QHS   Continuous Infusions:  magnesium sulfate bolus IVPB 2 g (12/14/21 1135)     LOS: 4 days    Time  spent: 35 minutes    Colyn Miron Hoover Brunette, DO [facility]  If 7PM-7AM, please contact night-coverage [email or web address] 12/14/2021, 11:40 AM

## 2021-12-14 NOTE — Inpatient Diabetes Management (Signed)
Inpatient Diabetes Program Recommendations  AACE/ADA: New Consensus Statement on Inpatient Glycemic Control (2015)  Target Ranges:  Prepandial:   less than 140 mg/dL      Peak postprandial:   less than 180 mg/dL (1-2 hours)      Critically ill patients:  140 - 180 mg/dL   Lab Results  Component Value Date   GLUCAP 314 (H) 12/14/2021   HGBA1C 9.5 (H) 12/08/2021    Review of Glycemic Control   Latest Reference Range & Units 12/13/21 07:25 12/13/21 11:01 12/13/21 16:59 12/13/21 21:48 12/14/21 07:30  Glucose-Capillary 70 - 99 mg/dL 257 (H) 424 (H) 309 (H) 320 (H) 314 (H)    Diabetes history: DM 2 Outpatient Diabetes medications: Glipizide 10 mg bid, Concentrated Humulin U-500 80 units tid, Ozempic 1 mg Weekly Current orders for Inpatient glycemic control:  Semglee 55 units Daily Novolog 0-20 units tid + hs Novolog 10 units tid meal coverage  Inpatient Diabetes Program Recommendations:    Please note pt on concentrated U500 insulin at home. Difficulty controlling glucose trends on Semglee doses with trends in the 300-400 range. Consider the following  -  Restart home Humulin R U-500 at 50 units tid  -  Discontinue Semglee and Novolog 10 units tid meal coverage   Thanks,  Tama Headings RN, MSN, BC-ADM Inpatient Diabetes Coordinator Team Pager [phone] (8a-5p)

## 2021-12-14 NOTE — Progress Notes (Signed)
Telemetry called. Patient had 4 beat nonsustained ventricular tachycardia. MD Josephine Cables made aware. Will continue to monitor.

## 2021-12-15 DIAGNOSIS — I5033 Acute on chronic diastolic (congestive) heart failure: Secondary | ICD-10-CM | POA: Diagnosis not present

## 2021-12-15 DIAGNOSIS — E785 Hyperlipidemia, unspecified: Secondary | ICD-10-CM | POA: Diagnosis not present

## 2021-12-15 DIAGNOSIS — J449 Chronic obstructive pulmonary disease, unspecified: Secondary | ICD-10-CM | POA: Diagnosis not present

## 2021-12-15 DIAGNOSIS — Z89512 Acquired absence of left leg below knee: Secondary | ICD-10-CM | POA: Diagnosis not present

## 2021-12-15 LAB — BASIC METABOLIC PANEL
Anion gap: 12 (ref 5–15)
BUN: 35 mg/dL — ABNORMAL HIGH (ref 6–20)
CO2: 28 mmol/L (ref 22–32)
Calcium: 9.3 mg/dL (ref 8.9–10.3)
Chloride: 93 mmol/L — ABNORMAL LOW (ref 98–111)
Creatinine, Ser: 1.24 mg/dL — ABNORMAL HIGH (ref 0.44–1.00)
GFR, Estimated: 51 mL/min — ABNORMAL LOW (ref >60)
Glucose, Bld: 353 mg/dL — ABNORMAL HIGH (ref 70–99)
Potassium: 4.4 mmol/L (ref 3.5–5.1)
Sodium: 133 mmol/L — ABNORMAL LOW (ref 135–145)

## 2021-12-15 LAB — LIPID PANEL
Cholesterol: 109 mg/dL (ref 0–200)
HDL: 25 mg/dL — ABNORMAL LOW (ref >40)
LDL Cholesterol: 23 mg/dL (ref 0–99)
Total CHOL/HDL Ratio: 4.4 RATIO
Triglycerides: 304 mg/dL — ABNORMAL HIGH (ref <150)
VLDL: 61 mg/dL — ABNORMAL HIGH (ref 0–40)

## 2021-12-15 LAB — GLUCOSE, CAPILLARY
Glucose-Capillary: 379 mg/dL — ABNORMAL HIGH (ref 70–99)
Glucose-Capillary: 185 mg/dL — ABNORMAL HIGH (ref 70–99)
Glucose-Capillary: 218 mg/dL — ABNORMAL HIGH (ref 70–99)

## 2021-12-15 LAB — MAGNESIUM: Magnesium: 2 mg/dL (ref 1.7–2.4)

## 2021-12-15 MED ORDER — DAPAGLIFLOZIN PROPANEDIOL 10 MG PO TABS
10.0000 mg | ORAL_TABLET | Freq: Every day | ORAL | Status: DC
  Administered 2021-12-15 – 2021-12-17 (×3): 10 mg via ORAL
  Filled 2021-12-15 (×3): qty 1

## 2021-12-15 NOTE — Progress Notes (Signed)
Patient is on home unit, sleeping comfortably.

## 2021-12-15 NOTE — Progress Notes (Signed)
   12/15/21 1145  ReDS Vest / Clip  Station Marker A  Ruler Value 30  ReDS Value Range (!) > 40  ReDS Actual Value 42

## 2021-12-15 NOTE — Progress Notes (Signed)
PROGRESS NOTE    Nicole [name]  RXV:[id number] DOB: [birth year] DOA: 12/08/2021 PCP: Celene Squibb, MD   Brief Narrative:  Nicole [name] is a 56 year old female with extensive history of HTN, HLD, DM 2, COPD, h/o diabetic foot, left BKA depression/anxiety, morbid obesity, CAD to stent to LAD, and 1 at RCA, with a recent left heart catheterization on 08/14/2021 -on Plavix and statin.  She presented with shortness of breath and has been admitted with dyspnea on exertion concerning for acute CHF exacerbation.  She has been started on IV diuresis and 2D echocardiogram pending.  Cardiology consulted.    Assessment & Plan:   Principal Problem:   Chest pain Active Problems:   Coronary artery disease involving native coronary artery of native heart with angina pectoris (HCC)   Dyslipidemia   COPD (chronic obstructive pulmonary disease) (HCC)   Primary hypertension   TIA (transient ischemic attack)   Morbid obesity with body mass index (BMI) of 50.0 to 59.9 in adult (HCC)   Anxiety and depression   Hx of BKA, left ([place])   Uncontrolled type 2 diabetes mellitus with hyperglycemia, with long-term current use of insulin (HCC)   Acute on chronic heart failure with preserved ejection fraction (HCC)   OSA on CPAP   Non-cardiac chest pain   Acute diastolic CHF (congestive heart failure) (HCC)  Assessment and Plan:  Acute on chronic diastolic congestive heart failure (HCC) - History of diastolic congestive heart failure -2D echocardiogram with LVEF 65-70% -Continue IV Lasix 80 mg BID per Cardiology recommendations, with potassium supplementation -Check strict I's and O's and daily weights -Appreciate cardiology involvement -Reds clip score 40% 10/24 -Foley catheter in place to assist with adequate strict intake and output. -Patient has been started on Farxiga.   Coronary artery disease involving native coronary artery of native heart with angina pectoris (HCC) - Continue Plavix, statins,   -Patient had a left heart catheterization in August 14, 2021 Minimum diffuse stenosis noted with 1 stent to RCA, and 2 stent to LAD, EJ EF 55-65%. -At the time recommendation for Plavix (allergic to aspirin), optimizing blood pressure control, LDL less than 70, tight control of DM 2. -Continue follow-up with cardiology service.   COPD (chronic obstructive pulmonary disease) (HCC) with chronic hypoxemia -Wears 3-4 L nasal cannula at home -Monitoring closely,and will continue as needed DuoNeb bronchodilators   Dyslipidemia - Continue lovastatin  -LDL 40 on lipid panel -Heart healthy diet discussed with patient.    Hx of BKA, left ([place]) - Stump clean, no signs of infection   Anxiety and depression -Currently stable, continue as needed now benzodiazepines -Resuming home medication   Morbid obesity with body mass index (BMI) of 50.0 to 59.9 in adult [facility]) -Body mass index is 53.14 kg/m. -Discussed with patient regarding her plan to follow-up with PCP: For strict diet exercise And referral to weight loss clinic -The benefit weight loss we discussed in detail--she expressed understanding and is willing to improve her nutrition.   TIA (transient ischemic attack) - Stable continue home medications lovastatin and Plavix -No new deficits or complaints reported.   HTN (hypertension), benign -Stable labetalol -Continue current antihypertensive regimen -Heart healthy diet discussed with patient.   Uncontrolled type 2 diabetes mellitus with hyperglycemia, with long-term current use of insulin (HCC) - Hemoglobin A1c 9.5% -Insulin 3 times daily at 50 units per diabetes coordinator recommendations -Modified carbohydrate diet discussed with patient.     DVT prophylaxis:Heparin Code Status: Full Family Communication: None at bedside  Disposition Plan: Admit for diuresis Status is: Inpatient Remains inpatient appropriate because: Need for IV meds.     Nutritional Assessment:   The  patient's BMI is: Body mass index is 53.14 kg/m.Marland Kitchen   Seen by dietician.  I agree with the assessment and plan as outlined below:   Consultants:  Cardiology   Procedures:  None   Antimicrobials:  None    Subjective: No chest pain, no nausea, no vomiting, no fever.  Reports breathing continue improving; still requiring 4 L nasal cannula supplementation and demonstrating signs of fluid overload on examination.  Objective: Vitals:   12/14/21 2015 12/15/21 0544 12/15/21 0838 12/15/21 1422  BP: (!) 165/52 (!) 143/51 (!) 137/51 (!) 135/36  Pulse: 69 71 72 65  Resp: 19 20  18   Temp: (!) 97.4 F (36.3 C) 97.9 F (36.6 C)  97.6 F (36.4 C)  TempSrc: Oral   Oral  SpO2: 100% 100%  96%  Weight:  133.8 kg    Height:        Intake/Output Summary (Last 24 hours) at 12/15/2021 1723 Last data filed at 12/15/2021 1300 Gross per 24 hour  Intake 240 ml  Output 3600 ml  Net -3360 ml   Filed Weights   12/13/21 0629 12/14/21 0549 12/15/21 0544  Weight: 130.2 kg 134.9 kg 133.8 kg    Examination: General exam: Alert, awake, oriented x 3; reports feeling better and with improved breathing sensation.  Still requiring 4 L nasal cannula supplementation.  Patient expressed mild orthopnea. Respiratory system: Decreased breath sounds at the bases; no wheezing, no using accessory muscles. Cardiovascular system:RRR.  Unable to properly assess JVD with body habitus; no rubs or gallops. Gastrointestinal system: Abdomen is obese, nondistended, soft and nontender. No organomegaly or masses felt. Normal bowel sounds heard. Central nervous system: Alert and oriented. No focal neurological deficits. Extremities: No cyanosis or clubbing.  Left BKA Skin: No petechiae. Psychiatry: Judgement and insight appear normal. Mood & affect appropriate.    Data Reviewed: I have personally reviewed following labs and imaging studies  CBC: Recent Labs  Lab 12/13/21 0557  WBC 10.9*  HGB 10.6*  HCT 31.7*  MCV  94.3  PLT 0000000   Basic Metabolic Panel: Recent Labs  Lab 12/11/21 0538 12/12/21 0659 12/13/21 0557 12/14/21 0524 12/15/21 0551  NA 138 134* 138 137 133*  K 4.0 4.5 4.0 3.9 4.4  CL 96* 94* 97* 94* 93*  CO2 31 30 31 31 28   GLUCOSE 284* 359* 272* 278* 353*  BUN 32* 37* 37* 33* 35*  CREATININE 1.10* 1.30* 1.19* 1.09* 1.24*  CALCIUM 9.0 8.9 9.2 9.2 9.3  MG 1.6* 1.7 1.8 1.8 2.0   GFR: Estimated Creatinine Clearance: 67.2 mL/min (A) (by C-G formula based on SCr of 1.24 mg/dL (H)).  CBG: Recent Labs  Lab 12/13/21 2148 12/14/21 0730 12/14/21 1652 12/15/21 1202 12/15/21 1651  GLUCAP 320* 314* 242* 379* 185*   Lipid Profile: Recent Labs    12/15/21 0551  CHOL 109  HDL 25*  LDLCALC 23  TRIG 304*  CHOLHDL 4.4   Scheduled Meds:  buPROPion ER  100 mg Oral BID   cariprazine  3 mg Oral Daily   clopidogrel  75 mg Oral Daily   dapagliflozin propanediol  10 mg Oral Daily   furosemide  80 mg Intravenous Q12H   Gerhardt's butt cream  1 Application Topical BID   heparin  5,000 Units Subcutaneous Q8H   insulin aspart  0-20 Units Subcutaneous TID WC  insulin aspart  0-5 Units Subcutaneous QHS   insulin regular human CONCENTRATED  50 Units Subcutaneous TID WC   melatonin  3 mg Oral QHS   metoprolol tartrate  25 mg Oral BID   pantoprazole  40 mg Oral QHS   potassium chloride  20 mEq Oral Daily   rosuvastatin  40 mg Oral QHS   Continuous Infusions:     LOS: 5 days    Barton Dubois, MD [facility]  If 7PM-7AM, please contact night-coverage [email or web address] 12/15/2021, 5:23 PM

## 2021-12-15 NOTE — Progress Notes (Signed)
Rounding Note    Patient Name: Nicole [name] Date of Encounter: 12/15/2021  [place] Cardiologist: Dorris Carnes, MD   Subjective   SOB improving  Inpatient Medications    Scheduled Meds:  buPROPion ER  100 mg Oral BID   cariprazine  3 mg Oral Daily   clopidogrel  75 mg Oral Daily   dapagliflozin propanediol  10 mg Oral Daily   furosemide  80 mg Intravenous Q12H   Gerhardt's butt cream  1 Application Topical BID   heparin  5,000 Units Subcutaneous Q8H   insulin aspart  0-20 Units Subcutaneous TID WC   insulin aspart  0-5 Units Subcutaneous QHS   insulin regular human CONCENTRATED  50 Units Subcutaneous TID WC   melatonin  3 mg Oral QHS   metoprolol tartrate  25 mg Oral BID   pantoprazole  40 mg Oral QHS   potassium chloride  20 mEq Oral Daily   rosuvastatin  40 mg Oral QHS   Continuous Infusions:  PRN Meds: acetaminophen, albuterol, ALPRAZolam, hydrALAZINE, loperamide, nitroGLYCERIN, nystatin, ondansetron (ZOFRAN) IV, zolpidem   Vital Signs    Vitals:   12/14/21 1442 12/14/21 2015 12/15/21 0544 12/15/21 0838  BP: (!) 134/44 (!) 165/52 (!) 143/51 (!) 137/51  Pulse: 63 69 71 72  Resp: 17 19 20    Temp:  (!) 97.4 F (36.3 C) 97.9 F (36.6 C)   TempSrc:  Oral    SpO2: 100% 100% 100%   Weight:   133.8 kg   Height:        Intake/Output Summary (Last 24 hours) at 12/15/2021 0916 Last data filed at 12/15/2021 0500 Gross per 24 hour  Intake 530 ml  Output 3400 ml  Net -2870 ml      12/15/2021    5:44 AM 12/14/2021    5:49 AM 12/13/2021    6:29 AM  Last 3 Weights  Weight (lbs) 294 lb 15.6 oz 297 lb 6.4 oz 287 lb 0.6 oz  Weight (kg) 133.8 kg 134.9 kg 130.2 kg      Telemetry    SR - Personally Reviewed  ECG    N/a - Personally Reviewed  Physical Exam   GEN: No acute distress.   Neck: No JVD Cardiac: RRR, no murmurs, rubs, or gallops.  Respiratory: Clear to auscultation bilaterally. GI: Soft, nontender, non-distended  MS: trace  bilateral edema Neuro:  Nonfocal  Psych: Normal affect   Labs    High Sensitivity Troponin:   Recent Labs  Lab 12/08/21 0913 12/08/21 1105 12/08/21 1435 12/08/21 1644  TROPONINIHS 15 14 13 12      Chemistry Recent Labs  Lab 12/13/21 0557 12/14/21 0524 12/15/21 0551  NA 138 137 133*  K 4.0 3.9 4.4  CL 97* 94* 93*  CO2 31 31 28   GLUCOSE 272* 278* 353*  BUN 37* 33* 35*  CREATININE 1.19* 1.09* 1.24*  CALCIUM 9.2 9.2 9.3  MG 1.8 1.8 2.0  GFRNONAA 53* 59* 51*  ANIONGAP 10 12 12     Lipids  Recent Labs  Lab 12/15/21 0551  CHOL 109  TRIG 304*  HDL 25*  LDLCALC 23  CHOLHDL 4.4    Hematology Recent Labs  Lab 12/08/21 1435 12/13/21 0557  WBC 10.5 10.9*  RBC 3.41* 3.36*  HGB 10.6* 10.6*  HCT 33.2* 31.7*  MCV 97.4 94.3  MCH 31.1 31.5  MCHC 31.9 33.4  RDW 14.3 14.2  PLT 190 181   Thyroid No results for input(s): "TSH", "FREET4" in the  last 168 hours.  BNPNo results for input(s): "BNP", "PROBNP" in the last 168 hours.  DDimer No results for input(s): "DDIMER" in the last 168 hours.   Radiology    No results found.  Cardiac Studies    Patient Profile     56 y.o. female with CAD (s/p stenting in 2009, low-risk NST in 2013, reported 2 more stents in [place] in 2020 and history of false negative stress tests, cath in 07/2021 showing patent stents along the RCA and mid LAD with only mild nonobstructive disease), ASA allergy, paroxysmal atrial fibrillation (isolated episode in 09/2019 and not started on chronic anticoagulation), HTN, HLD, PAD (s/p L BKA), COPD with chronic respiratory failure on 4L [place] home O2, super morbid obesity, OSA on CPAP, TIA, anxiety, depression, and Type 2 DM. Presented to the hospital with worsening CP/SOB, edema, and abdominal distention felt due to CHF.  Assessment & Plan    1.Acute on chronic HFpEF - 11/2021 echo LVEF 82-95%, indet diastolic, RV not well visualized. BNP 80 - CXR prominent pulml vasculature, right basilar patchy  opacities - 11/2021 CT chest: no acute process    - negative 2.8 L yesterday, neg 11 L since admission. She is on IV lasix 80mg  bid, mild fluctuation in Cr without clear trend. Reds vest 44% 2 days ago, asking for repeat value -on farxiga 10mg  daily in setting of HFpEF - diuresing well. Difficult to assess volume status by exam given body habitus, I suspect nearing euvolemia. Have asked for repeat reds vest today. I  suspect likely one more day of IV diuretic and likely being able to change to oral torsemide 20mg  daily tomorrow.    2. CAD - history of prior stenting - 07/2021 cath: mild nonobstructive disease -no acute issues this admssion For questions or updates, please contact [place] Please consult [email or web address] for contact info under        Signed, Carlyle Dolly, MD  12/15/2021, 9:16 AM

## 2021-12-15 NOTE — Inpatient Diabetes Management (Signed)
Inpatient Diabetes Program Recommendations  AACE/ADA: New Consensus Statement on Inpatient Glycemic Control (2015)  Target Ranges:  Prepandial:   less than 140 mg/dL      Peak postprandial:   less than 180 mg/dL (1-2 hours)      Critically ill patients:  140 - 180 mg/dL   Lab Results  Component Value Date   GLUCAP 242 (H) 12/14/2021   HGBA1C 9.5 (H) 12/08/2021    Review of Glycemic Control   Latest Reference Range & Units 12/15/21 05:51  Glucose 70 - 99 mg/dL 353 (H)   Diabetes history: DM 2 Outpatient Diabetes medications: Glipizide 10 mg bid, Concentrated Humulin U-500 80 units tid, Ozempic 1 mg Weekly Current orders for Inpatient glycemic control:  Humulin R U-500 50 units tid Novolog 0-20 units tid + hs Farxiga 10 mg Daily  Inpatient Diabetes Program Recommendations:    -  Increase Humulin R U-500 closer to home dose at 65 units tid  Thanks,  Tama Headings RN, MSN, BC-ADM Inpatient Diabetes Coordinator Team Pager [phone] (8a-5p)      Thanks,  Tama Headings RN, MSN, BC-ADM Inpatient Diabetes Coordinator Team Pager [phone] (8a-5p)

## 2021-12-16 DIAGNOSIS — E785 Hyperlipidemia, unspecified: Secondary | ICD-10-CM | POA: Diagnosis not present

## 2021-12-16 DIAGNOSIS — I5033 Acute on chronic diastolic (congestive) heart failure: Secondary | ICD-10-CM | POA: Diagnosis not present

## 2021-12-16 DIAGNOSIS — Z89512 Acquired absence of left leg below knee: Secondary | ICD-10-CM | POA: Diagnosis not present

## 2021-12-16 DIAGNOSIS — J449 Chronic obstructive pulmonary disease, unspecified: Secondary | ICD-10-CM | POA: Diagnosis not present

## 2021-12-16 DIAGNOSIS — I5031 Acute diastolic (congestive) heart failure: Secondary | ICD-10-CM | POA: Diagnosis not present

## 2021-12-16 LAB — BASIC METABOLIC PANEL
Anion gap: 13 (ref 5–15)
BUN: 36 mg/dL — ABNORMAL HIGH (ref 6–20)
CO2: 30 mmol/L (ref 22–32)
Calcium: 9.5 mg/dL (ref 8.9–10.3)
Chloride: 93 mmol/L — ABNORMAL LOW (ref 98–111)
Creatinine, Ser: 1.26 mg/dL — ABNORMAL HIGH (ref 0.44–1.00)
GFR, Estimated: 50 mL/min — ABNORMAL LOW (ref >60)
Glucose, Bld: 228 mg/dL — ABNORMAL HIGH (ref 70–99)
Potassium: 4.2 mmol/L (ref 3.5–5.1)
Sodium: 136 mmol/L (ref 135–145)

## 2021-12-16 LAB — MAGNESIUM: Magnesium: 2 mg/dL (ref 1.7–2.4)

## 2021-12-16 LAB — GLUCOSE, CAPILLARY
Glucose-Capillary: 268 mg/dL — ABNORMAL HIGH (ref 70–99)
Glucose-Capillary: 428 mg/dL — ABNORMAL HIGH (ref 70–99)
Glucose-Capillary: 216 mg/dL — ABNORMAL HIGH (ref 70–99)

## 2021-12-16 LAB — GLUCOSE, RANDOM: Glucose, Bld: 390 mg/dL — ABNORMAL HIGH (ref 70–99)

## 2021-12-16 MED ORDER — CHLORHEXIDINE GLUCONATE CLOTH 2 % EX PADS
6.0000 | MEDICATED_PAD | Freq: Every day | CUTANEOUS | Status: DC
  Administered 2021-12-16 – 2021-12-17 (×2): 6 via TOPICAL

## 2021-12-16 MED ORDER — INSULIN ASPART 100 UNIT/ML IJ SOLN
25.0000 [IU] | Freq: Once | INTRAMUSCULAR | Status: AC
  Administered 2021-12-16: 25 [IU] via SUBCUTANEOUS

## 2021-12-16 NOTE — Inpatient Diabetes Management (Signed)
Inpatient Diabetes Program Recommendations  AACE/ADA: New Consensus Statement on Inpatient Glycemic Control (2015)  Target Ranges:  Prepandial:   less than 140 mg/dL      Peak postprandial:   less than 180 mg/dL (1-2 hours)      Critically ill patients:  140 - 180 mg/dL   Lab Results  Component Value Date   GLUCAP 268 (H) 12/16/2021   HGBA1C 9.5 (H) 12/08/2021    Review of Glycemic Control   Latest Reference Range & Units 12/14/21 07:30 12/14/21 16:52 12/15/21 12:02 12/15/21 16:51 12/15/21 21:23 12/16/21 07:21  Glucose-Capillary 70 - 99 mg/dL 314 (H) 242 (H) 379 (H) 185 (H) 218 (H) 268 (H)   Diabetes history: DM 2 Outpatient Diabetes medications: Glipizide 10 mg bid, Concentrated Humulin U-500 80 units tid, Ozempic 1 mg Weekly Current orders for Inpatient glycemic control:  Humulin R U-500 50 units tid Novolog 0-20 units tid + hs Farxiga 10 mg Daily  Inpatient Diabetes Program Recommendations:    -  Increase Humulin R U-500 closer to home dose at 60 units tid  Thanks,  Tama Headings RN, MSN, BC-ADM Inpatient Diabetes Coordinator Team Pager [phone] (8a-5p)

## 2021-12-16 NOTE — Progress Notes (Addendum)
Rounding Note    Patient Name: Nicole [name] Date of Encounter: 12/16/2021  [place] Cardiologist: Dorris Carnes, MD   Subjective   Breathing close to baseline. No chest pain or palpitations. She has been able to go without supplemental oxygen at times and feels comfortable.   Inpatient Medications    Scheduled Meds:  buPROPion ER  100 mg Oral BID   cariprazine  3 mg Oral Daily   Chlorhexidine Gluconate Cloth  6 each Topical Daily   clopidogrel  75 mg Oral Daily   dapagliflozin propanediol  10 mg Oral Daily   furosemide  80 mg Intravenous Q12H   Gerhardt's butt cream  1 Application Topical BID   heparin  5,000 Units Subcutaneous Q8H   insulin aspart  0-20 Units Subcutaneous TID WC   insulin aspart  0-5 Units Subcutaneous QHS   insulin aspart  25 Units Subcutaneous Once   insulin regular human CONCENTRATED  50 Units Subcutaneous TID WC   melatonin  3 mg Oral QHS   metoprolol tartrate  25 mg Oral BID   pantoprazole  40 mg Oral QHS   potassium chloride  20 mEq Oral Daily   rosuvastatin  40 mg Oral QHS   Continuous Infusions:  PRN Meds: acetaminophen, albuterol, ALPRAZolam, hydrALAZINE, loperamide, nitroGLYCERIN, nystatin, ondansetron (ZOFRAN) IV, zolpidem   Vital Signs    Vitals:   12/15/21 2026 12/15/21 2118 12/16/21 0456 12/16/21 0459  BP: (!) 158/55 (!) 134/45 (!) 132/50   Pulse: 74 71 64   Resp: 19 16 18    Temp: 97.7 F (36.5 C) 98 F (36.7 C) 98.2 F (36.8 C)   TempSrc: Oral Oral    SpO2: 100% 97% 99%   Weight:    132.9 kg  Height:        Intake/Output Summary (Last 24 hours) at 12/16/2021 1151 Last data filed at 12/16/2021 0900 Gross per 24 hour  Intake 480 ml  Output 4500 ml  Net -4020 ml      12/16/2021    4:59 AM 12/15/2021    5:44 AM 12/14/2021    5:49 AM  Last 3 Weights  Weight (lbs) 293 lb 1.6 oz 294 lb 15.6 oz 297 lb 6.4 oz  Weight (kg) 132.949 kg 133.8 kg 134.9 kg      Telemetry    NSR, HR in 60's to 70's with  occasional PVC's.  - Personally Reviewed  ECG    No new tracings.   Physical Exam   GEN: Obese female appearing in no acute distress.   Neck: No JVD Cardiac: RRR, no murmurs, rubs, or gallops.  Respiratory: Clear to auscultation bilaterally without wheezing or rales. GI: Soft, nontender, non-distended  MS: Trace edema along RLE; Left BKA.  Neuro:  Nonfocal  Psych: Normal affect   Labs    High Sensitivity Troponin:   Recent Labs  Lab 12/08/21 0913 12/08/21 1105 12/08/21 1435 12/08/21 1644  TROPONINIHS 15 14 13 12      Chemistry Recent Labs  Lab 12/14/21 0524 12/15/21 0551 12/16/21 0337  NA 137 133* 136  K 3.9 4.4 4.2  CL 94* 93* 93*  CO2 31 28 30   GLUCOSE 278* 353* 228*  BUN 33* 35* 36*  CREATININE 1.09* 1.24* 1.26*  CALCIUM 9.2 9.3 9.5  MG 1.8 2.0 2.0  GFRNONAA 59* 51* 50*  ANIONGAP 12 12 13     Lipids  Recent Labs  Lab 12/15/21 0551  CHOL 109  TRIG 304*  HDL 25*  LDLCALC  23  CHOLHDL 4.4    Hematology Recent Labs  Lab 12/13/21 0557  WBC 10.9*  RBC 3.36*  HGB 10.6*  HCT 31.7*  MCV 94.3  MCH 31.5  MCHC 33.4  RDW 14.2  PLT 181   Thyroid No results for input(s): "TSH", "FREET4" in the last 168 hours.  BNPNo results for input(s): "BNP", "PROBNP" in the last 168 hours.  DDimer No results for input(s): "DDIMER" in the last 168 hours.   Radiology    No results found.  Cardiac Studies   Echocardiogram: 12/09/2021 IMPRESSIONS     1. Left ventricular ejection fraction, by estimation, is 65 to 70%. The  left ventricle has hyperdynamic function. The left ventricle has no  regional wall motion abnormalities. Left ventricular diastolic parameters  are indeterminate.   2. Right ventricular systolic function was not well visualized. The right  ventricular size is not well visualized.   3. The mitral valve was not well visualized. No evidence of mitral valve  regurgitation. No evidence of mitral stenosis.   4. The aortic valve is calcified.  Aortic valve regurgitation is not  visualized. No aortic stenosis is present.   Patient Profile     56 y.o. female w/ PMH of CAD (s/p stenting in 2009, low-risk NST in 2013, reported 2 more stents in [place] in 2020 and history of false negative stress tests, cath in 07/2021 showing patent stents along the RCA and mid LAD with only mild nonobstructive disease), ASA allergy, paroxysmal atrial fibrillation (isolated episode in 09/2019 and not started on chronic anticoagulation), HTN, HLD, PAD (s/p L BKA), COPD, super morbid obesity, OSA on CPAP, TIA, anxiety, depression, and Type 2 DM. Presented to the hospital with worsening CP/SOB, edema, and abdominal distention felt due to CHF.   Assessment & Plan    1. Dyspnea on Exertion/Acute HFpEF - Admitted for an acute CHF exacerbation and repeat echo showed a normal EF of 65-70% with no WMA and no significant valve abnormalities.  - Weight has declined from a peak of 308 lbs to 293 lbs today. Overall net output of -14.7 L thus far. ReDS improved but at 42 on most recent check yesterday.  - She has been receiving IV Lasix 80mg  BID and received a dose this morning. Creatinine trending up slightly to 1.26. She feels that she still has more fluid and would feel more comfortable with an additional day of IV diuresis. Given she responded well yesterday with a net output of -4.0 L, this seems reasonable. Also says family would be able to help with discharge tomorrow. Would plan to switch to Torsemide 20mg  daily tomorrow while continuing on Farxiga 10mg  daily.    2. CAD/Atypical Chest Pain - She has a history of stenting to the RCA and LAD and given her history of reported false-negative stress tests, she underwent a repeat cardiac catheterization in 07/2021 which showed patent stents with otherwise mild nonobstructive disease.  - Her pain on admission was most consistent with pleuritic pain and she has ruled out for ACS. Echo shows a preserved EF with no WMA. No  plans for additional testing this admission. - Continue Plavix, Lopressor and Crestor. Previously intolerant to Imdur and she is allergic to ASA.    3. HTN - BP has overall been stable at 132/50 - 158/55 within the past 24 hours. Continue Lopressor 25mg  BID.    4. HLD - FLP at 40 when checked this admission. Continue Crestor 40mg  daily.    5. IDDM - Hgb  A1c elevated to 9.5 this admission. Management per the admitting team.    6. OSA - Uses CPAP at home and has been using this admission.   For questions or updates, please contact [place] Please consult [email or web address] for contact info under        Signed, Erma Heritage, PA-C  12/16/2021, 11:51 AM    Personally seen and examined. Agree with above.  56 year old with ejection fraction 65 to 70% with coronary disease here with dyspnea on exertion and acute HFpEF, atypical chest pain.  Obese, comfortable, clear lungs.  Trace edema.  Left BKA  Atypical chest pain with underlying CAD - Echo here shows preserved EF no additional testing pleuritic type discomfort  Acute on chronic HFpEF -Peak weight 308, 293 today.  14 L out.  Continue with IV Lasix 80 twice daily.  Creatinine slightly elevated at 1.26.  She would like to have 1 more day of diuresis.  This seems very reasonable. - Plan to switch to torsemide 20 mg daily with Farxiga 10 mg tomorrow.  Hyperlipidemia - Continue with Crestor 40 mg.  LDL 40.  Diabetes with hypertension/diastolic heart failure/CAD - Hemoglobin A1c 9.5, continue to work on this.  OSA - CPAP at home.  Left BKA - Amputation noted.  Candee Furbish, MD

## 2021-12-16 NOTE — Plan of Care (Signed)
  Problem: Education: Goal: Ability to describe self-care measures that may prevent or decrease complications (Diabetes Survival Skills Education) will improve Outcome: Progressing   Problem: Coping: Goal: Ability to adjust to condition or change in health will improve Outcome: Progressing   

## 2021-12-16 NOTE — Progress Notes (Signed)
Slept well throughout the night. Compliant with CPAP, no issues noted.

## 2021-12-16 NOTE — Progress Notes (Addendum)
Nutrition Brief Note  Patient identified on the Malnutrition Screening Tool (MST) Report  Per chart review- patient hx includes DM2, COPD, left, BKA, CAD and heart catheterization. Presented with shortness of breath, chest pain.    Body mass index is 51.92 kg/m. Patient meets criteria for morbid obesity based on current BMI.   Current diet order is Consistent cho, patient is consuming approximately 100% of meals at this time. Patient followed by inpatient diabetes team. Patient allowed RD to provide review of diabetic diet guidelines and handout.  Patient sitting up on bedside. She is long time diabetic 30 years per patient. She lives with daughter and says," I eat what she buys".   Labs and medications reviewed. 10/17-A1C- 9.5%    Latest Ref Rng & Units 12/16/2021    3:37 AM 12/15/2021    5:51 AM 12/14/2021    5:24 AM  BMP  Glucose 70 - 99 mg/dL 228  353  278   BUN 6 - 20 mg/dL 36  35  33   Creatinine 0.44 - 1.00 mg/dL 1.26  1.24  1.09   Sodium 135 - 145 mmol/L 136  133  137   Potassium 3.5 - 5.1 mmol/L 4.2  4.4  3.9   Chloride 98 - 111 mmol/L 93  93  94   CO2 22 - 32 mmol/L 30  28  31    Calcium 8.9 - 10.3 mg/dL 9.5  9.3  9.2      Plan: Provide review of "Diabetes Plate Method" handout.    Colman Cater MS,RD,CSG,LDN Contact: Shea Evans

## 2021-12-16 NOTE — Progress Notes (Signed)
PROGRESS NOTE    Nicole [name]  [id number] DOB: 13-Jan-1966 DOA: 12/08/2021 PCP: Celene Squibb, MD   Brief Narrative:  Nicole [name] is a 56 year old female with extensive history of HTN, HLD, DM 2, COPD, h/o diabetic foot, left BKA depression/anxiety, morbid obesity, CAD to stent to LAD, and 1 at RCA, with a recent left heart catheterization on 08/14/2021 -on Plavix and statin.  She presented with shortness of breath and has been admitted with dyspnea on exertion concerning for acute CHF exacerbation.  She has been started on IV diuresis and 2D echocardiogram pending.  Cardiology consulted.    Assessment & Plan:   Principal Problem:   Chest pain Active Problems:   Coronary artery disease involving native coronary artery of native heart with angina pectoris (HCC)   Dyslipidemia   COPD (chronic obstructive pulmonary disease) (HCC)   Primary hypertension   TIA (transient ischemic attack)   Morbid obesity with body mass index (BMI) of 50.0 to 59.9 in adult (HCC)   Anxiety and depression   Hx of BKA, left (HCC)   Uncontrolled type 2 diabetes mellitus with hyperglycemia, with long-term current use of insulin (HCC)   Acute on chronic heart failure with preserved ejection fraction (HCC)   OSA on CPAP   Non-cardiac chest pain   Acute diastolic CHF (congestive heart failure) (HCC)  Assessment and Plan:  Acute on chronic diastolic congestive heart failure (HCC) - History of diastolic congestive heart failure -2D echocardiogram with LVEF 65-70% -Continue IV Lasix 80 mg BID per Cardiology recommendations, with potassium supplementation. -Check strict I's and O's and daily weights -Appreciate cardiology involvement -Reds clip score 40% 10/24 -Foley catheter in place to assist with adequate strict intake and output. -Patient has been started on Farxiga. -Hopefully transition to oral torsemide at discharge on 12/17/2021.   Coronary artery disease involving native coronary artery of  native heart with angina pectoris (HCC) - Continue Plavix, statins,  -Patient had a left heart catheterization in August 14, 2021 Minimum diffuse stenosis noted with 1 stent to RCA, and 2 stent to LAD, EJ EF 55-65%. -At the time recommendation for Plavix (allergic to aspirin), optimizing blood pressure control, LDL less than 70, tight control of DM 2. -Continue follow-up with cardiology service.   COPD (chronic obstructive pulmonary disease) (HCC) with chronic hypoxemia -Wears 3-4 L nasal cannula at home -Monitoring closely,and will continue as needed DuoNeb bronchodilators. -Good saturation appreciated with chronic supplementation.   Dyslipidemia - Continue lovastatin  -LDL 40 on lipid panel -Heart healthy diet discussed with patient.    Hx of BKA, left ([place]) - Stump clean, no signs of infection   Anxiety and depression -Currently stable, continue as needed now benzodiazepines -Resuming home medication   Morbid obesity with body mass index (BMI) of 50.0 to 59.9 in adult [facility]) -Body mass index is 53.14 kg/m. -Discussed with patient regarding her plan to follow-up with PCP: For strict diet exercise and further assistance with weight loss management. And referral to weight loss clinic -The benefit weight loss we discussed in detail--she expressed understanding and is willing to improve her nutrition.   TIA (transient ischemic attack) - Stable continue home medications lovastatin and Plavix -No new deficits or complaints reported.   HTN (hypertension), benign -Stable labetalol -Continue current antihypertensive regimen -Heart healthy diet discussed with patient.   Uncontrolled type 2 diabetes mellitus with hyperglycemia, with long-term current use of insulin (HCC) - Hemoglobin A1c 9.5% -Insulin 3 times daily at 50 units per diabetes  coordinator recommendations -Modified carbohydrate diet discussed with patient.     DVT prophylaxis:Heparin Code Status: Full Family  Communication: None at bedside Disposition Plan: Admit for diuresis Status is: Inpatient Remains inpatient appropriate because: Need for IV meds.     Nutritional Assessment:   The patient's BMI is: Body mass index is 53.14 kg/m.Marland Kitchen   Seen by dietician.  I agree with the assessment and plan as outlined below:   Consultants:  Cardiology   Procedures:  None   Antimicrobials:  None    Subjective: No chest pain, no nausea, no vomiting, no fever.  Continue slowly improving from breathing standpoint.  Still with signs of fluid overload on exam.  Good urine output reported.  Patient reports orthopnea.  Objective: Vitals:   12/15/21 2118 12/16/21 0456 12/16/21 0459 12/16/21 1414  BP: (!) 134/45 (!) 132/50  (!) 121/44  Pulse: 71 64  71  Resp: 16 18  20   Temp: 98 F (36.7 C) 98.2 F (36.8 C)    TempSrc: Oral     SpO2: 97% 99%  100%  Weight:   132.9 kg   Height:        Intake/Output Summary (Last 24 hours) at 12/16/2021 1606 Last data filed at 12/16/2021 1200 Gross per 24 hour  Intake 480 ml  Output 4500 ml  Net -4020 ml   Filed Weights   12/14/21 0549 12/15/21 0544 12/16/21 0459  Weight: 134.9 kg 133.8 kg 132.9 kg    Examination: General exam: Alert, awake, oriented x 3; still short winded with activity and using 4 L nasal cannula supplementation.  Patient expressed mild orthopnea and there are signs of fluid overload on physical exam (fine crackles, increased abdominal girth and lower extremity swelling). Respiratory system: Fine crackles at the bases; no using accessory muscles.  Good saturation on 4 L nasal cannula supplementation. Cardiovascular system:RRR. No rub rubs or gallops; unable to properly assess JVD with body habitus. Gastrointestinal system: Abdomen is obese, nondistended, soft and nontender. No organomegaly or masses felt. Normal bowel sounds heard.  Increased abdominal girth appreciated on exam. Central nervous system: Alert and oriented. No focal  neurological deficits. Extremities: No signof clubbing; left BKA.  Trace-1+ edema appreciated up to her thighs (right lower extremity). Skin: No petechiae. Psychiatry: Judgement and insight appear normal. Mood & affect appropriate.    Data Reviewed: I have personally reviewed following labs and imaging studies  CBC: Recent Labs  Lab 12/13/21 0557  WBC 10.9*  HGB 10.6*  HCT 31.7*  MCV 94.3  PLT 0000000   Basic Metabolic Panel: Recent Labs  Lab 12/12/21 0659 12/13/21 0557 12/14/21 0524 12/15/21 0551 12/16/21 0337 12/16/21 1204  NA 134* 138 137 133* 136  --   K 4.5 4.0 3.9 4.4 4.2  --   CL 94* 97* 94* 93* 93*  --   CO2 30 31 31 28 30   --   GLUCOSE 359* 272* 278* 353* 228* 390*  BUN 37* 37* 33* 35* 36*  --   CREATININE 1.30* 1.19* 1.09* 1.24* 1.26*  --   CALCIUM 8.9 9.2 9.2 9.3 9.5  --   MG 1.7 1.8 1.8 2.0 2.0  --    GFR: Estimated Creatinine Clearance: 65.8 mL/min (A) (by C-G formula based on SCr of 1.26 mg/dL (H)).  CBG: Recent Labs  Lab 12/15/21 1202 12/15/21 1651 12/15/21 2123 12/16/21 0721 12/16/21 1131  GLUCAP 379* 185* 218* 268* 428*   Lipid Profile: Recent Labs    12/15/21 0551  CHOL 109  HDL 25*  LDLCALC 23  TRIG 304*  CHOLHDL 4.4   Scheduled Meds:  buPROPion ER  100 mg Oral BID   cariprazine  3 mg Oral Daily   Chlorhexidine Gluconate Cloth  6 each Topical Daily   clopidogrel  75 mg Oral Daily   dapagliflozin propanediol  10 mg Oral Daily   furosemide  80 mg Intravenous Q12H   Gerhardt's butt cream  1 Application Topical BID   heparin  5,000 Units Subcutaneous Q8H   insulin aspart  0-20 Units Subcutaneous TID WC   insulin aspart  0-5 Units Subcutaneous QHS   insulin regular human CONCENTRATED  50 Units Subcutaneous TID WC   melatonin  3 mg Oral QHS   metoprolol tartrate  25 mg Oral BID   pantoprazole  40 mg Oral QHS   potassium chloride  20 mEq Oral Daily   rosuvastatin  40 mg Oral QHS   Continuous Infusions:    LOS: 6 days     Barton Dubois, MD [facility]  If 7PM-7AM, please contact night-coverage [email or web address] 12/16/2021, 4:06 PM

## 2021-12-16 NOTE — Progress Notes (Signed)
Patient using home CPAP unit independently. Water level was appropriate, plugged into correct outlet and O2 running. Told patient to call if she needed anything.

## 2021-12-17 DIAGNOSIS — I5031 Acute diastolic (congestive) heart failure: Secondary | ICD-10-CM | POA: Diagnosis not present

## 2021-12-17 LAB — GLUCOSE, CAPILLARY
Glucose-Capillary: 354 mg/dL — ABNORMAL HIGH (ref 70–99)
Glucose-Capillary: 324 mg/dL — ABNORMAL HIGH (ref 70–99)
Glucose-Capillary: 348 mg/dL — ABNORMAL HIGH (ref 70–99)
Glucose-Capillary: 390 mg/dL — ABNORMAL HIGH (ref 70–99)
Glucose-Capillary: 348 mg/dL — ABNORMAL HIGH (ref 70–99)
Glucose-Capillary: 371 mg/dL — ABNORMAL HIGH (ref 70–99)
Glucose-Capillary: 243 mg/dL — ABNORMAL HIGH (ref 70–99)
Glucose-Capillary: 316 mg/dL — ABNORMAL HIGH (ref 70–99)
Glucose-Capillary: 379 mg/dL — ABNORMAL HIGH (ref 70–99)

## 2021-12-17 LAB — BASIC METABOLIC PANEL
Anion gap: 13 (ref 5–15)
BUN: 41 mg/dL — ABNORMAL HIGH (ref 6–20)
CO2: 30 mmol/L (ref 22–32)
Calcium: 9.4 mg/dL (ref 8.9–10.3)
Chloride: 93 mmol/L — ABNORMAL LOW (ref 98–111)
Creatinine, Ser: 1.51 mg/dL — ABNORMAL HIGH (ref 0.44–1.00)
GFR, Estimated: 40 mL/min — ABNORMAL LOW (ref >60)
Glucose, Bld: 248 mg/dL — ABNORMAL HIGH (ref 70–99)
Potassium: 3.8 mmol/L (ref 3.5–5.1)
Sodium: 136 mmol/L (ref 135–145)

## 2021-12-17 LAB — MAGNESIUM: Magnesium: 2 mg/dL (ref 1.7–2.4)

## 2021-12-17 MED ORDER — DAPAGLIFLOZIN PROPANEDIOL 10 MG PO TABS: 10.0000 mg | ORAL_TABLET | Freq: Every day | ORAL | 3 refills | Status: DC

## 2021-12-17 MED ORDER — METOPROLOL TARTRATE 25 MG PO TABS: 25.0000 mg | ORAL_TABLET | Freq: Two times a day (BID) | ORAL | 3 refills | Status: DC

## 2021-12-17 MED ORDER — TORSEMIDE 40 MG PO TABS: 40.0000 mg | ORAL_TABLET | Freq: Every day | ORAL | 3 refills | Status: DC

## 2021-12-17 MED ORDER — TORSEMIDE 20 MG PO TABS
40.0000 mg | ORAL_TABLET | Freq: Every day | ORAL | Status: DC
  Administered 2021-12-17: 20 mg via ORAL
  Filled 2021-12-17: qty 2

## 2021-12-17 MED ORDER — POTASSIUM CHLORIDE CRYS ER 20 MEQ PO TBCR: 20.0000 meq | EXTENDED_RELEASE_TABLET | Freq: Every day | ORAL | 0 refills | Status: DC

## 2021-12-17 NOTE — Discharge Summary (Signed)
Physician Discharge Summary   Patient: Nicole [name] MRN: [id number] DOB: [birth year]  Admit date:     12/08/2021  Discharge date: 12/17/21  Discharge Physician: Barton Dubois   PCP: Celene Squibb, MD   Recommendations at discharge:  Reassess blood pressure and adjust antihypertensive regimen as needed Repeat basic metabolic panel to follow electrolytes renal function Continue close monitoring of patient's CBGs/A1c with further adjustment to hypoglycemia regimen as needed Make sure patient has follow-up with cardiology service as instructed.  Discharge Diagnoses: Principal Problem:   Chest pain Active Problems:   Coronary artery disease involving native coronary artery of native heart with angina pectoris (HCC)   Dyslipidemia   COPD (chronic obstructive pulmonary disease) (HCC)   Primary hypertension   TIA (transient ischemic attack)   Morbid obesity with body mass index (BMI) of 50.0 to 59.9 in adult (HCC)   Anxiety and depression   Hx of BKA, left (HCC)   Uncontrolled type 2 diabetes mellitus with hyperglycemia, with long-term current use of insulin (HCC)   Acute on chronic heart failure with preserved ejection fraction (HCC)   OSA on CPAP   Non-cardiac chest pain   Acute diastolic CHF (congestive heart failure) ([place])  Resolved Problems:   Morbid obesity ([place])   CAD in native artery  Hospital Course: Nicole [name] is a 56 year old female with extensive history of HTN, HLD, DM 2, COPD, h/o diabetic foot, left BKA depression/anxiety, morbid obesity, CAD to stent to LAD, and 1 at RCA, with a recent left heart catheterization on 08/14/2021 -on Plavix and statin.  She presented with shortness of breath and has been admitted with dyspnea on exertion concerning for acute CHF exacerbation.  She has been started on IV diuresis and 2D echocardiogram pending.  Cardiology consulted.  Assessment and Plan: Acute on chronic diastolic congestive heart failure (HCC) - History of diastolic  congestive heart failure -2D echocardiogram with LVEF 65-70% -Continue checking daily weights -Appreciate cardiology involvement -last Reds clip score around 40%; patient diuresed using IV Lasix on a creatinine bump up some. -Following cardiology recommendations she was transitioned to oral torsemide 40 mg daily and has been started on Farxiga. -Continue outpatient follow-up. -Patient instructed to maintain adequate hydration and to follow low-sodium diet.   Coronary artery disease involving native coronary artery of native heart with angina pectoris (HCC) - Continue Plavix, statins,  -Patient had a left heart catheterization in August 14, 2021 Minimum diffuse stenosis noted with 1 stent to RCA, and 2 stent to LAD, EJ EF 55-65%. -At the time recommendation for Plavix (allergic to aspirin), optimizing blood pressure control, LDL less than 70, tight control of DM 2. -Continue follow-up with cardiology service after discharge.Marland Kitchen   COPD (chronic obstructive pulmonary disease) (HCC) with chronic hypoxemia -Wears 3-4 L nasal cannula at home -Monitoring closely,and will continue as needed DuoNeb bronchodilators. -Good saturation appreciated with chronic supplementation.   Dyslipidemia -Continue lovastatin  -LDL 40 on lipid panel -Heart healthy diet discussed with patient.    Hx of BKA, left ([place]) - Stump clean, no signs of infection   Anxiety and depression -Currently stable, continue as needed now benzodiazepines -Resuming home medication   Morbid obesity with body mass index (BMI) of 50.0 to 59.9 in adult [facility]) -Body mass index is 53.14 kg/m. -Discussed with patient regarding her plan to follow-up with PCP: For strict diet exercise and further assistance with weight loss management. And referral to weight loss clinic -The benefit weight loss we discussed in detail--she expressed  understanding and is willing to improve her nutrition.   TIA (transient ischemic attack) - Stable continue  home medications lovastatin and Plavix -No new deficits or complaints reported.   HTN (hypertension), benign -Stable overall. -Continue current antihypertensive regimen -Heart healthy diet discussed with patient.   Uncontrolled type 2 diabetes mellitus with hyperglycemia, with long-term current use of insulin (HCC) - Hemoglobin A1c 9.5% -Resume home hypoglycemic regimen. -Modified carbohydrate diet discussed with patient.   Consultants: Cardiology service Procedures performed: See below for x-ray reports. Disposition: Home Diet recommendation: Heart healthy/low-sodium, low calorie and modified carbohydrate diet.  DISCHARGE MEDICATION: Allergies as of 12/17/2021       Reactions   Aspirin Anaphylaxis, Other (See Comments)   Throat closing    Bee Venom Anaphylaxis   Pineapple Anaphylaxis   Definity [perflutren Lipid Microsphere]    Muscle Aches   E-mycin [erythromycin Base] Nausea And Vomiting        Medication List     STOP taking these medications    furosemide 40 MG tablet Commonly known as: Lasix   glipiZIDE 10 MG tablet Commonly known as: GLUCOTROL   lisinopril 10 MG tablet Commonly known as: ZESTRIL   mupirocin ointment 2 % Commonly known as: BACTROBAN       TAKE these medications    Accu-Chek Guide test strip Generic drug: glucose blood USE 1 STRIP TO CHECK GLUCOSE 4 TIMES DAILY   acetaminophen 500 MG tablet Commonly known as: TYLENOL Take 1 tablet (500 mg total) by mouth every 6 (six) hours as needed for moderate pain.   albuterol (2.5 MG/3ML) 0.083% nebulizer solution Commonly known as: PROVENTIL Take 3 mLs (2.5 mg total) by nebulization every 6 (six) hours as needed for wheezing.   albuterol 108 (90 Base) MCG/ACT inhaler Commonly known as: VENTOLIN HFA Inhale 2 puffs into the lungs every 6 (six) hours as needed for wheezing. Shortness of breath   B-D ULTRAFINE III SHORT PEN 31G X 8 MM Misc Generic drug: Insulin Pen Needle USE AS  DIRECTED   blood glucose meter kit and supplies Dispense based on patient and insurance preference. Use four times daily as directed. (FOR ICD-10 E10.9, E11.9).   buPROPion ER 100 MG 12 hr tablet Commonly known as: WELLBUTRIN SR Take 100 mg by mouth daily.   clopidogrel 75 MG tablet Commonly known as: PLAVIX Take 1 tablet (75 mg total) by mouth daily.   dapagliflozin propanediol 10 MG Tabs tablet Commonly known as: FARXIGA Take 1 tablet (10 mg total) by mouth daily. Start taking on: December 18, 2021   Easy Touch Insulin Syringe 31G X 5/16" 1 ML Misc Generic drug: Insulin Syringe-Needle U-100   EPINEPHrine 0.3 mg/0.3 mL Soaj injection Commonly known as: EPI-PEN Inject 0.3 mg into the muscle as needed for anaphylaxis.   Gerhardt's butt cream Crea Apply 1 Application topically 2 (two) times daily.   HumuLIN R U-500 KwikPen 500 UNIT/ML KwikPen Generic drug: insulin regular human CONCENTRATED Inject 80 Units into the skin 3 (three) times daily with meals.   Kerendia 10 MG Tabs Generic drug: Finerenone Take 1 tablet by mouth daily.   loperamide 2 MG capsule Commonly known as: IMODIUM Take 1 capsule (2 mg total) by mouth every 6 (six) hours as needed for diarrhea or loose stools.   metoprolol tartrate 25 MG tablet Commonly known as: LOPRESSOR Take 1 tablet (25 mg total) by mouth 2 (two) times daily. What changed:  medication strength how much to take   nitroGLYCERIN 0.4 MG SL  tablet Commonly known as: NITROSTAT Take one tablet every 5 minutes as needed for chest pains up to 3 tablets. What changed:  how much to take how to take this when to take this reasons to take this additional instructions   nystatin powder Generic drug: nystatin Apply 1 Application topically daily as needed (irritation).   One-A-Day Womens Formula Tabs Take 1 tablet by mouth daily.   pantoprazole 40 MG tablet Commonly known as: PROTONIX Take 1 tablet (40 mg total) by mouth daily. What  changed: when to take this   potassium chloride SA 20 MEQ tablet Commonly known as: KLOR-CON M Take 1 tablet (20 mEq total) by mouth daily. Start taking on: December 18, 2021   rosuvastatin 40 MG tablet Commonly known as: CRESTOR Take 40 mg by mouth at bedtime.   Semaglutide (1 MG/DOSE) 4 MG/3ML Sopn Inject 1 mg as directed once a week.   Torsemide 40 MG Tabs Take 40 mg by mouth daily. Start taking on: December 18, 2021   Vitamin D (Ergocalciferol) 1.25 MG (50000 UNIT) Caps capsule Commonly known as: DRISDOL Take 50,000 Units by mouth every Monday.   Vraylar 3 MG capsule Generic drug: cariprazine Take 3 mg by mouth daily.        Follow-up Information     Imogene Burn, PA-C Follow up on 01/11/2022.   Specialty: Cardiology Why: Cardiology Hospital Follow-up on 01/11/2022 at 2:00 PM. Contact information: [place] [address] [phone]         Celene Squibb, MD. Schedule an appointment as soon as possible for a visit in 2 week(s).   Specialty: Internal Medicine Contact information: [place] [address] [phone]                Discharge Exam: [facility] [facility]   12/15/21 0544 12/16/21 0459 12/17/21 0241  Weight: 133.8 kg 132.9 kg 135.8 kg   General exam: Alert, awake, oriented x 3; still having some trace edema on her lower extremity and increased abdominal girth (difficult on assessment given patient's obesity); no chest pain, no nausea, no vomiting, no palpitations, no orthopnea. Respiratory system: Good saturation on 3-4 L nasal cannula supplementation.  No using accessory muscles.  Patient reports no orthopnea.  No wheezing or crackles appreciated on exam. Cardiovascular system:RRR. No rub rubs or gallops; unable to properly assess JVD with body habitus. Gastrointestinal system: Abdomen is obese, nondistended, soft and nontender. No organomegaly or masses felt. Normal bowel sounds heard.  Increased abdominal girth  appreciated on exam. Central nervous system: Alert and oriented. No focal neurological deficits. Extremities: No signof clubbing; left BKA.  Trace edema appreciated up to her thighs (right lower extremity). Skin: No petechiae. Psychiatry: Judgement and insight appear normal. Mood & affect appropriate.   Condition at discharge: Stable and improved.  The results of significant diagnostics from this hospitalization (including imaging, microbiology, ancillary and laboratory) are listed below for reference.   Imaging Studies: ECHOCARDIOGRAM COMPLETE  Result Date: 12/09/2021    ECHOCARDIOGRAM REPORT   Patient Name:   [facility] Date of Exam: 12/09/2021 Medical Rec #:  [id number]       Height:       63.0 in Accession #:    [id number]      Weight:       300.0 lb Date of Birth:  [birth year]        BSA:          2.297 m Patient Age:  56 years        BP:           139/56 mmHg Patient Gender: F               HR:           70 bpm. Exam Location:  [facility] Procedure: 2D Echo, Cardiac Doppler, Color Doppler and Intracardiac            Opacification Agent Indications:    CHF  History:        Patient has prior history of Echocardiogram examinations, most                 recent 04/06/2011. CHF, CAD, TIA and COPD, Arrythmias:Atrial                 Fibrillation, Signs/Symptoms:Chest Pain and Murmur; Risk                 Factors:Hypertension, Diabetes, Dyslipidemia and Former Smoker.  Sonographer:    Wenda Low Referring Phys: [id number] Geronimo D [name]  1. Left ventricular ejection fraction, by estimation, is 65 to 70%. The left ventricle has hyperdynamic function. The left ventricle has no regional wall motion abnormalities. Left ventricular diastolic parameters are indeterminate.  2. Right ventricular systolic function was not well visualized. The right ventricular size is not well visualized.  3. The mitral valve was not well visualized. No evidence of mitral valve regurgitation. No evidence of mitral  stenosis.  4. The aortic valve is calcified. Aortic valve regurgitation is not visualized. No aortic stenosis is present. FINDINGS  Left Ventricle: Left ventricular ejection fraction, by estimation, is 65 to 70%. The left ventricle has hyperdynamic function. The left ventricle has no regional wall motion abnormalities. Definity contrast agent was given IV to delineate the left ventricular endocardial borders. The left ventricular internal cavity size was normal in size. Left ventricular diastolic parameters are indeterminate. Right Ventricle: The right ventricular size is not well visualized. Right vetricular wall thickness was not well visualized. Right ventricular systolic function was not well visualized. Left Atrium: Left atrial size was not well visualized. Right Atrium: Right atrial size was not well visualized. Pericardium: The pericardium was not well visualized. Mitral Valve: The mitral valve was not well visualized. No evidence of mitral valve regurgitation. No evidence of mitral valve stenosis. MV peak gradient, 4.5 mmHg. The mean mitral valve gradient is 2.0 mmHg. Tricuspid Valve: The tricuspid valve is not well visualized. Tricuspid valve regurgitation is not demonstrated. No evidence of tricuspid stenosis. Aortic Valve: The aortic valve is calcified. Aortic valve regurgitation is not visualized. No aortic stenosis is present. Aortic valve mean gradient measures 7.0 mmHg. Aortic valve peak gradient measures 12.0 mmHg. Aortic valve area, by VTI measures 2.51  cm. Pulmonic Valve: The pulmonic valve was not well visualized. Pulmonic valve regurgitation is not visualized. No evidence of pulmonic stenosis. Aorta: The aortic arch was not well visualized and the ascending aorta was not well visualized. Venous: The inferior vena cava was not well visualized. IAS/Shunts: No atrial level shunt detected by color flow Doppler.  LEFT VENTRICLE PLAX 2D LVIDd:         6.60 cm LVIDs:         4.60 cm LV PW:          1.10 cm LV IVS:        1.10 cm LVOT diam:     2.20 cm LV SV:  104 LV SV Index:   45 LVOT Area:     3.80 cm  LEFT ATRIUM         Index LA diam:    3.90 cm 1.70 cm/m  AORTIC VALVE                     PULMONIC VALVE AV Area (Vmax):    2.57 cm      PV Vmax:       1.45 m/s AV Area (Vmean):   2.60 cm      PV Peak grad:  8.4 mmHg AV Area (VTI):     2.51 cm AV Vmax:           173.00 cm/s AV Vmean:          126.000 cm/s AV VTI:            0.413 m AV Peak Grad:      12.0 mmHg AV Mean Grad:      7.0 mmHg LVOT Vmax:         117.00 cm/s LVOT Vmean:        86.100 cm/s LVOT VTI:          0.273 m LVOT/AV VTI ratio: 0.66  AORTA Ao Root diam: 3.30 cm MITRAL VALVE MV Area (PHT): 4.68 cm     SHUNTS MV Area VTI:   3.52 cm     Systemic VTI:  0.27 m MV Peak grad:  4.5 mmHg     Systemic Diam: 2.20 cm MV Mean grad:  2.0 mmHg MV Vmax:       1.06 m/s MV Vmean:      67.7 cm/s MV Decel Time: 162 msec MV E velocity: 106.00 cm/s MV A velocity: 60.20 cm/s MV E/A ratio:  1.76 Vishnu Priya Mallipeddi Electronically signed by Lorelee Cover Mallipeddi Signature Date/Time: 12/09/2021/6:31:01 PM    Final    CT Chest W Contrast  Result Date: 12/08/2021 CLINICAL DATA:  COPD exacerbation. Shortness of breath and chest pain. EXAM: CT CHEST WITH CONTRAST TECHNIQUE: Multidetector CT imaging of the chest was performed during intravenous contrast administration. RADIATION DOSE REDUCTION: This exam was performed according to the departmental dose-optimization program which includes automated exposure control, adjustment of the mA and/or kV according to patient size and/or use of iterative reconstruction technique. CONTRAST:  168m OMNIPAQUE IOHEXOL 300 MG/ML  SOLN COMPARISON:  Radiographs 12/08/2021 and 07/16/2022. FINDINGS: Cardiovascular: No acute vascular findings are demonstrated. There is mild atherosclerosis of the aorta, great vessels and coronary arteries. Mild central enlargement of the pulmonary arteries which may indicate pulmonary  arterial hypertension. The heart size is normal. There is no pericardial effusion. Mediastinum/Nodes: There are no enlarged mediastinal, hilar or axillary lymph nodes. The thyroid gland, trachea and esophagus demonstrate no significant findings. Lungs/Pleura: No pleural effusion or pneumothorax. Minimal atelectasis at the lung bases. No confluent airspace opacity or suspicious pulmonary nodule. Upper abdomen: Diffuse low density throughout the liver consistent with steatosis. The hepatic contours are irregular, and there is relative enlargement of the left lobe suspicious for early cirrhosis. No focal hepatic lesions are identified. Small lymph nodes in the porta hepatis are likely reactive. Musculoskeletal/Chest wall: There is no chest wall mass or suspicious osseous finding. Mild thoracic spondylosis. Mild dependent edema within the chest wall. IMPRESSION: 1. No acute findings or explanation for the patient's symptoms. 2. Mild central enlargement of the pulmonary arteries may indicate pulmonary arterial hypertension. 3. Hepatic steatosis with possible early cirrhosis. 4.  Aortic Atherosclerosis (  ICD10-I70.0). Electronically Signed   By: Richardean Sale M.D.   On: 12/08/2021 10:51   DG Chest Port 1 View  Result Date: 12/08/2021 CLINICAL DATA:  Chest pain and shortness of breath EXAM: PORTABLE CHEST 1 VIEW COMPARISON:  Chest radiograph dated 07/15/2012 FINDINGS: Normal lung volumes. Right basilar patchy and left mid lung linear opacities. Similar appearance of increased interstitial opacities bilaterally. No pleural effusion or pneumothorax. Similar enlarged cardiomediastinal silhouette. The visualized skeletal structures are unremarkable. Radiodensity at the left upper quadrant is likely external to the patient. IMPRESSION: 1. Right basilar patchy opacities may reflect atelectasis, aspiration, or pneumonia. Left mid lung linear opacity, likely atelectasis. 2. Similar appearance of increased interstitial  opacities bilaterally which may reflect prominent pulmonary vasculature. 3. Similar cardiomegaly. Electronically Signed   By: Darrin Nipper M.D.   On: 12/08/2021 09:11    Microbiology: Results for orders placed or performed during the hospital encounter of 06/16/21  Blood Cultures x 2 sites     Status: None   Collection Time: 06/16/21 11:47 AM   Specimen: Right Antecubital; Blood  Result Value Ref Range Status   Specimen Description RIGHT ANTECUBITAL  Final   Special Requests   Final    BOTTLES DRAWN AEROBIC AND ANAEROBIC Blood Culture adequate volume   Culture   Final    NO GROWTH 5 DAYS Performed at [facility], [address], [place], [place] [address]    Report Status 06/21/2021 FINAL  Final  Blood Cultures x 2 sites     Status: None   Collection Time: 06/16/21 11:47 AM   Specimen: Left Antecubital; Blood  Result Value Ref Range Status   Specimen Description LEFT ANTECUBITAL  Final   Special Requests   Final    BOTTLES DRAWN AEROBIC AND ANAEROBIC Blood Culture adequate volume   Culture   Final    NO GROWTH 5 DAYS Performed at [facility], [address] [place], [place] [address]    Report Status 06/21/2021 FINAL  Final    Labs: CBC: Recent Labs  Lab 12/13/21 0557  WBC 10.9*  HGB 10.6*  HCT 31.7*  MCV 94.3  PLT 438   Basic Metabolic Panel: Recent Labs  Lab 12/13/21 0557 12/14/21 0524 12/15/21 0551 12/16/21 0337 12/16/21 1204 12/17/21 0425  NA 138 137 133* 136  --  136  K 4.0 3.9 4.4 4.2  --  3.8  CL 97* 94* 93* 93*  --  93*  CO2 31 31 28 30   --  30  GLUCOSE 272* 278* 353* 228* 390* 248*  BUN 37* 33* 35* 36*  --  41*  CREATININE 1.19* 1.09* 1.24* 1.26*  --  1.51*  CALCIUM 9.2 9.2 9.3 9.5  --  9.4  MG 1.8 1.8 2.0 2.0  --  2.0   Liver Function Tests: No results for input(s): "AST", "ALT", "ALKPHOS", "BILITOT", "PROT", "ALBUMIN" in the last 168 hours. CBG: Recent Labs  Lab 12/16/21 0721 12/16/21 1131 12/16/21 1617 12/17/21 0746 12/17/21 1121  GLUCAP  268* 428* 216* 316* 379*    Discharge time spent: greater than 30 minutes.  Signed: Barton Dubois, MD [facility] 12/17/2021

## 2021-12-17 NOTE — Care Management Important Message (Signed)
Important Message  Patient Details  Name: Nicole [name] MRN: [id number] Date of Birth: 06-29-1965   Medicare Important Message Given:  Yes     Tommy Medal 12/17/2021, 12:02 PM

## 2021-12-17 NOTE — Inpatient Diabetes Management (Signed)
Inpatient Diabetes Program Recommendations  AACE/ADA: New Consensus Statement on Inpatient Glycemic Control (2015)  Target Ranges:  Prepandial:   less than 140 mg/dL      Peak postprandial:   less than 180 mg/dL (1-2 hours)      Critically ill patients:  140 - 180 mg/dL   Lab Results  Component Value Date   GLUCAP 316 (H) 12/17/2021   HGBA1C 9.5 (H) 12/08/2021    Latest Reference Range & Units 12/16/21 07:21 12/16/21 11:31 12/16/21 16:17 12/17/21 07:46  Glucose-Capillary 70 - 99 mg/dL 268 (H) 428 (H) 216 (H) 316 (H)  (H): Data is abnormally high Review of Glycemic Control  Diabetes history: type 2 Outpatient Diabetes medications: U-500 insulin 80 units TID, Glipizide 10 mg BID, Ozempic 1 mg weekly Current orders for Inpatient glycemic control: Humulin U-500 insulin 50 units TID, Novolog 0-20 units TID & HS, Farxiga 10 mg daily  Inpatient Diabetes Program Recommendations:   Noted that blood sugars continue to be elevated, greater than 200 mg/dl.  Recommend increasing U-500 insulin to 60 units TID with meals if CBGs continue to be elevated.   Harvel Ricks RN BSN CDE Diabetes Coordinator Pager: [phone]  8am-5pm

## 2021-12-17 NOTE — Progress Notes (Signed)
Foley removed at 1125

## 2021-12-17 NOTE — Progress Notes (Signed)
Rounding Note    Patient Name: Nicole [name] Date of Encounter: 12/17/2021  [place] Cardiologist: Dorris Carnes, MD   Subjective   No complaints  Inpatient Medications    Scheduled Meds:  buPROPion ER  100 mg Oral BID   cariprazine  3 mg Oral Daily   Chlorhexidine Gluconate Cloth  6 each Topical Daily   clopidogrel  75 mg Oral Daily   dapagliflozin propanediol  10 mg Oral Daily   furosemide  80 mg Intravenous Q12H   Gerhardt's butt cream  1 Application Topical BID   heparin  5,000 Units Subcutaneous Q8H   insulin aspart  0-20 Units Subcutaneous TID WC   insulin aspart  0-5 Units Subcutaneous QHS   insulin regular human CONCENTRATED  50 Units Subcutaneous TID WC   melatonin  3 mg Oral QHS   metoprolol tartrate  25 mg Oral BID   pantoprazole  40 mg Oral QHS   potassium chloride  20 mEq Oral Daily   rosuvastatin  40 mg Oral QHS   Continuous Infusions:  PRN Meds: acetaminophen, albuterol, ALPRAZolam, hydrALAZINE, loperamide, nitroGLYCERIN, nystatin, ondansetron (ZOFRAN) IV, zolpidem   Vital Signs    Vitals:   12/16/21 1414 12/16/21 2059 12/17/21 0241 12/17/21 0441  BP: (!) 121/44 (!) 154/50  (!) 152/54  Pulse: 71 76  71  Resp: 20 20  19   Temp:  98.2 F (36.8 C)  98 F (36.7 C)  TempSrc:  Oral    SpO2: 100% 100%  99%  Weight:   135.8 kg   Height:        Intake/Output Summary (Last 24 hours) at 12/17/2021 0815 Last data filed at 12/17/2021 7473 Gross per 24 hour  Intake 714 ml  Output 3100 ml  Net -2386 ml      12/17/2021    2:41 AM 12/16/2021    4:59 AM 12/15/2021    5:44 AM  Last 3 Weights  Weight (lbs) 299 lb 6.2 oz 293 lb 1.6 oz 294 lb 15.6 oz  Weight (kg) 135.8 kg 132.949 kg 133.8 kg      Telemetry    SR - Personally Reviewed  ECG    N/a - Personally Reviewed  Physical Exam  N/a GEN: No acute distress.   Neck: No JVD Cardiac: RRR, no murmurs, rubs, or gallops.  Respiratory: Clear to auscultation bilaterally. GI:  Soft, nontender, non-distended  MS: No edema; No deformity. Neuro:  Nonfocal  Psych: Normal affect   Labs    High Sensitivity Troponin:   Recent Labs  Lab 12/08/21 0913 12/08/21 1105 12/08/21 1435 12/08/21 1644  TROPONINIHS 15 14 13 12      Chemistry Recent Labs  Lab 12/15/21 0551 12/16/21 0337 12/16/21 1204 12/17/21 0425  NA 133* 136  --  136  K 4.4 4.2  --  3.8  CL 93* 93*  --  93*  CO2 28 30  --  30  GLUCOSE 353* 228* 390* 248*  BUN 35* 36*  --  41*  CREATININE 1.24* 1.26*  --  1.51*  CALCIUM 9.3 9.5  --  9.4  MG 2.0 2.0  --  2.0  GFRNONAA 51* 50*  --  40*  ANIONGAP 12 13  --  13    Lipids  Recent Labs  Lab 12/15/21 0551  CHOL 109  TRIG 304*  HDL 25*  LDLCALC 23  CHOLHDL 4.4    Hematology Recent Labs  Lab 12/13/21 0557  WBC 10.9*  RBC 3.36*  HGB 10.6*  HCT 31.7*  MCV 94.3  MCH 31.5  MCHC 33.4  RDW 14.2  PLT 181   Thyroid No results for input(s): "TSH", "FREET4" in the last 168 hours.  BNPNo results for input(s): "BNP", "PROBNP" in the last 168 hours.  DDimer No results for input(s): "DDIMER" in the last 168 hours.   Radiology    No results found.  Cardiac Studies     Patient Profile     56 y.o. female with CAD (s/p stenting in 2009, low-risk NST in 2013, reported 2 more stents in [place] in 2020 and history of false negative stress tests, cath in 07/2021 showing patent stents along the RCA and mid LAD with only mild nonobstructive disease), ASA allergy, paroxysmal atrial fibrillation (isolated episode in 09/2019 and not started on chronic anticoagulation), HTN, HLD, PAD (s/p L BKA), COPD with chronic respiratory failure on 4L [place] home O2, super morbid obesity, OSA on CPAP, TIA, anxiety, depression, and Type 2 DM. Presented to the hospital with worsening CP/SOB, edema, and abdominal distention felt due to CHF.  Assessment & Plan    1.Acute on chronic HFpEF - 11/2021 echo LVEF 65-70%, indet diastolic, RV not well visualized. BNP 80 - CXR  prominent pulml vasculature, right basilar patchy opacities - 11/2021 CT chest: no acute process     - negative 2.3 L yesterday, neg 17 L since admission. She is on IV lasix 80mg  bid, mild fluctuation in Cr without clear trend. Uptrend in Cr to 1.5 today, had been 1.1-1.2 last few days. Reds vest appears was not done.  - on farxiga 10mg  daily in setting of HFpEF - change to oral torsemide 40mg  daily.    2. CAD - history of prior stenting - 07/2021 cath: mild nonobstructive disease -no acute issues this admssion  Ok for discharge, we will sign off inpatient care. We will arrange outpatient f/u.   For questions or updates, please contact [facility] Please consult [email or web address] for contact info under        Signed, , MD  12/17/2021, 8:15 AM

## 2021-12-19 DIAGNOSIS — J449 Chronic obstructive pulmonary disease, unspecified: Secondary | ICD-10-CM | POA: Diagnosis not present

## 2021-12-22 ENCOUNTER — Telehealth: Payer: Self-pay | Admitting: Podiatry

## 2021-12-22 NOTE — Telephone Encounter (Signed)
Pt lvm requesting dm shoe measurement appt

## 2021-12-24 DIAGNOSIS — J449 Chronic obstructive pulmonary disease, unspecified: Secondary | ICD-10-CM | POA: Diagnosis not present

## 2021-12-25 DIAGNOSIS — M6281 Muscle weakness (generalized): Secondary | ICD-10-CM | POA: Diagnosis not present

## 2021-12-28 NOTE — Progress Notes (Signed)
Cardiology Office Note:    Date:  01/11/2022   ID:  TORSHA LEMUS, DOB [birth year], MRN [id number]  PCP:  Celene Squibb, MD  [facility] Cardiologist:  Dorris Carnes, MD     Referring MD: Celene Squibb, MD   Chief Complaint:  Hospitalization Follow-up     History of Present Illness:   MIRIANA GAERTNER is a 56 y.o. female with CAD (s/p stenting in 2009, low-risk NST in 2013, reported 2 more stents in [place] in 2020 and history of false negative stress tests, cath in 07/2021 showing patent stents along the RCA and mid LAD with only mild nonobstructive disease), ASA allergy, paroxysmal atrial fibrillation (isolated episode in 09/2019 and not started on chronic anticoagulation), HTN, HLD, PAD (s/p L BKA), COPD with chronic respiratory failure on 4L [place] home O2, super morbid obesity, OSA on CPAP, TIA, anxiety, depression, and Type 2 DM.      Patient discharged 12/17/21 after admission with acute on chronic diastolic CHF. Echo LVEF 65-70%. Diuresed 17 L.   Patient comes in with her daughter. Denies chest pain, shortness of breath. Doesn't have a scale. Doesn't know meds except she ran out of K a couple days ago.  Her daughter is trying to manage her diet and drinks but patient doesn't always comply.    Past Medical History:  Diagnosis Date   CAP (community acquired pneumonia)    Streptococcus 01/2011   COPD (chronic obstructive pulmonary disease) (HCC)    Coronary atherosclerosis of native coronary artery    a. Diagnosed [place] 2006 - DES RCA, reports followup cath 2009 at [facility] b. reported 2 stents in [place] in 2020. c. 07/2021: cath showing patent stents along RCA and mid-LAD with scattered 20% stenosis but no obstructive disease.   Dyslipidemia    Essential hypertension, benign    Morbid obesity (HCC)    Pancreatitis    Type 2 diabetes mellitus (HCC)    Current Medications: Current Meds  Medication Sig   ACCU-CHEK GUIDE test strip USE 1 STRIP TO CHECK GLUCOSE  4 TIMES DAILY   acetaminophen (TYLENOL) 500 MG tablet Take 1 tablet (500 mg total) by mouth every 6 (six) hours as needed for moderate pain.   albuterol (PROVENTIL) (2.5 MG/3ML) 0.083% nebulizer solution Take 3 mLs (2.5 mg total) by nebulization every 6 (six) hours as needed for wheezing.   albuterol (VENTOLIN HFA) 108 (90 Base) MCG/ACT inhaler Inhale 2 puffs into the lungs every 6 (six) hours as needed for wheezing. Shortness of breath   blood glucose meter kit and supplies Dispense based on patient and insurance preference. Use four times daily as directed. (FOR ICD-10 E10.9, E11.9).   buPROPion ER (WELLBUTRIN SR) 100 MG 12 hr tablet Take 100 mg by mouth daily.   clopidogrel (PLAVIX) 75 MG tablet Take 1 tablet (75 mg total) by mouth daily.   dapagliflozin propanediol (FARXIGA) 10 MG TABS tablet Take 1 tablet (10 mg total) by mouth daily.   EASY TOUCH INSULIN SYRINGE 31G X 5/16" 1 ML MISC    EPINEPHrine 0.3 mg/0.3 mL IJ SOAJ injection Inject 0.3 mg into the muscle as needed for anaphylaxis.   Insulin Pen Needle (B-D ULTRAFINE III SHORT PEN) 31G X 8 MM MISC USE AS DIRECTED   insulin regular human CONCENTRATED (HUMULIN R U-500 KWIKPEN) 500 UNIT/ML KwikPen Inject 80 Units into the skin 3 (three) times daily with meals.   KERENDIA 10 MG TABS Take 1 tablet by mouth daily.  loperamide (IMODIUM) 2 MG capsule Take 1 capsule (2 mg total) by mouth every 6 (six) hours as needed for diarrhea or loose stools.   metoprolol tartrate (LOPRESSOR) 25 MG tablet Take 1 tablet (25 mg total) by mouth 2 (two) times daily.   Multiple Vitamins-Calcium (ONE-A-DAY WOMENS FORMULA) TABS Take 1 tablet by mouth daily.   nitroGLYCERIN (NITROSTAT) 0.4 MG SL tablet Take one tablet every 5 minutes as needed for chest pains up to 3 tablets. (Patient taking differently: Place 0.4 mg under the tongue every 5 (five) minutes as needed for chest pain (every 5 minutes as needed for chest pains up to 3 tablets.).)   Nystatin (GERHARDT'S  BUTT CREAM) CREA Apply 1 Application topically 2 (two) times daily.   nystatin powder Apply 1 Application topically daily as needed (irritation).   pantoprazole (PROTONIX) 40 MG tablet Take 1 tablet (40 mg total) by mouth daily. (Patient taking differently: Take 40 mg by mouth at bedtime.)   rosuvastatin (CRESTOR) 40 MG tablet Take 40 mg by mouth at bedtime.   Semaglutide, 1 MG/DOSE, 4 MG/3ML SOPN Inject 1 mg as directed once a week.   Vitamin D, Ergocalciferol, (DRISDOL) 1.25 MG (50000 UNIT) CAPS capsule Take 50,000 Units by mouth every Monday.   VRAYLAR 3 MG capsule Take 3 mg by mouth daily.   [DISCONTINUED] potassium chloride SA (KLOR-CON M) 20 MEQ tablet Take 1 tablet (20 mEq total) by mouth daily.   [DISCONTINUED] torsemide 40 MG TABS Take 40 mg by mouth daily.    Allergies:   Aspirin, Bee venom, Pineapple, Definity [perflutren lipid microsphere], and E-mycin [erythromycin base]   Social History   Tobacco Use   Smoking status: Former    Packs/day: 0.50    Years: 20.00    Total pack years: 10.00    Types: Cigarettes    Quit date: 01/22/2020    Years since quitting: 1.9   Smokeless tobacco: Never  Vaping Use   Vaping Use: Never used  Substance Use Topics   Alcohol use: No   Drug use: No    Family Hx: The patient's family history includes Asthma in her son; Diabetes in her mother; Heart failure in her mother; Hypertension in her mother; Kidney failure in her mother; Ovarian cancer in her mother.  ROS     Physical Exam:    VS:  BP (!) 120/56   Pulse 73   Ht _0  (1.626 m)   Wt 288 lb (130.6 kg)   SpO2 96%   BMI 49.44 kg/m     Wt Readings from Last 3 Encounters:  01/11/22 288 lb (130.6 kg)  12/17/21 299 lb 6.2 oz (135.8 kg)  11/03/21 295 lb (133.8 kg)    Physical Exam  GEN: Obese, in no acute distress  Neck: no JVD, carotid bruits, or masses Cardiac:RRR some skipping; 2/6 systolic murmur  Respiratory:  clear to auscultation bilaterally, normal work of  breathing GI: soft, nontender, nondistended, + BS Ext: LLE amputation right without cyanosis, clubbing, or edema, Good distal pulses bilaterally Neuro:  Alert and Oriented x 3,  Psych: euthymic mood, full affect        EKGs/Labs/Other Test Reviewed:    EKG:  EKG is not ordered today.     Recent Labs: 12/08/2021: ALT 19; B Natriuretic Peptide 80.0 12/13/2021: Hemoglobin 10.6; Platelets 181 12/17/2021: BUN 41; Creatinine, Ser 1.51; Magnesium 2.0; Potassium 3.8; Sodium 136   Recent Lipid Panel Recent Labs    12/15/21 0551  CHOL 109  TRIG  304*  HDL 25*  VLDL 61*  LDLCALC 23     Prior CV Studies: ECHO COMPLETE WITH IMAGING ENHANCING AGENT 12/09/2021  Narrative ECHOCARDIOGRAM REPORT    Patient Name:   LURLINE CAVER [facility] Date of Exam: 12/09/2021 Medical Rec #:  [id number]       Height:       63.0 in Accession #:    [id number]      Weight:       300.0 lb Date of Birth:  05/27/1965        BSA:          2.297 m Patient Age:    9 years        BP:           139/56 mmHg Patient Gender: F               HR:           70 bpm. Exam Location:  [facility]  Procedure: 2D Echo, Cardiac Doppler, Color Doppler and Intracardiac Opacification Agent  Indications:    CHF  History:        Patient has prior history of Echocardiogram examinations, most recent 04/06/2011. CHF, CAD, TIA and COPD, Arrythmias:Atrial Fibrillation, Signs/Symptoms:Chest Pain and Murmur; Risk Factors:Hypertension, Diabetes, Dyslipidemia and Former Smoker.  Sonographer:    Wenda Low Referring Phys: [id number] [place]   1. Left ventricular ejection fraction, by estimation, is 65 to 70%. The left ventricle has hyperdynamic function. The left ventricle has no regional wall motion abnormalities. Left ventricular diastolic parameters are indeterminate. 2. Right ventricular systolic function was not well visualized. The right ventricular size is not well visualized. 3. The mitral valve was not  well visualized. No evidence of mitral valve regurgitation. No evidence of mitral stenosis. 4. The aortic valve is calcified. Aortic valve regurgitation is not visualized. No aortic stenosis is present.  FINDINGS Left Ventricle: Left ventricular ejection fraction, by estimation, is 65 to 70%. The left ventricle has hyperdynamic function. The left ventricle has no regional wall motion abnormalities. Definity contrast agent was given IV to delineate the left ventricular endocardial borders. The left ventricular internal cavity size was normal in size. Left ventricular diastolic parameters are indeterminate.  Right Ventricle: The right ventricular size is not well visualized. Right vetricular wall thickness was not well visualized. Right ventricular systolic function was not well visualized.  Left Atrium: Left atrial size was not well visualized.  Right Atrium: Right atrial size was not well visualized.  Pericardium: The pericardium was not well visualized.  Mitral Valve: The mitral valve was not well visualized. No evidence of mitral valve regurgitation. No evidence of mitral valve stenosis. MV peak gradient, 4.5 mmHg. The mean mitral valve gradient is 2.0 mmHg.  Tricuspid Valve: The tricuspid valve is not well visualized. Tricuspid valve regurgitation is not demonstrated. No evidence of tricuspid stenosis.  Aortic Valve: The aortic valve is calcified. Aortic valve regurgitation is not visualized. No aortic stenosis is present. Aortic valve mean gradient measures 7.0 mmHg. Aortic valve peak gradient measures 12.0 mmHg. Aortic valve area, by VTI measures 2.51 cm.  Pulmonic Valve: The pulmonic valve was not well visualized. Pulmonic valve regurgitation is not visualized. No evidence of pulmonic stenosis.  Aorta: The aortic arch was not well visualized and the ascending aorta was not well visualized.  Venous: The inferior vena cava was not well visualized.  IAS/Shunts: No atrial level shunt  detected by color flow Doppler.   LEFT VENTRICLE  PLAX 2D LVIDd:         6.60 cm LVIDs:         4.60 cm LV PW:         1.10 cm LV IVS:        1.10 cm LVOT diam:     2.20 cm LV SV:         104 LV SV Index:   45 LVOT Area:     3.80 cm   LEFT ATRIUM         Index LA diam:    3.90 cm 1.70 cm/m AORTIC VALVE                     PULMONIC VALVE AV Area (Vmax):    2.57 cm      PV Vmax:       1.45 m/s AV Area (Vmean):   2.60 cm      PV Peak grad:  8.4 mmHg AV Area (VTI):     2.51 cm AV Vmax:           173.00 cm/s AV Vmean:          126.000 cm/s AV VTI:            0.413 m AV Peak Grad:      12.0 mmHg AV Mean Grad:      7.0 mmHg LVOT Vmax:         117.00 cm/s LVOT Vmean:        86.100 cm/s LVOT VTI:          0.273 m LVOT/AV VTI ratio: 0.66  AORTA Ao Root diam: 3.30 cm  MITRAL VALVE MV Area (PHT): 4.68 cm     SHUNTS MV Area VTI:   3.52 cm     Systemic VTI:  0.27 m MV Peak grad:  4.5 mmHg     Systemic Diam: 2.20 cm MV Mean grad:  2.0 mmHg MV Vmax:       1.06 m/s MV Vmean:      67.7 cm/s MV Decel Time: 162 msec MV E velocity: 106.00 cm/s MV A velocity: 60.20 cm/s MV E/A ratio:  1.76  Vishnu Priya Mallipeddi Electronically signed by Lorelee Cover Mallipeddi Signature Date/Time: 12/09/2021/6:31:01 PM    Final        Risk Assessment/Calculations/Metrics:              ASSESSMENT & PLAN:   No problem-specific Assessment & Plan notes found for this encounter.   Chronic diastolic CHF with recent admission diuresed 17 L. Patient doesn't have a scale but weight is down 11 lbs since admission. Her daughter is trying to work with her diet. Lasix changed to torsemide at discharge. Ran out of K 2 days ago. Will check bmet today. Crt 1.5 at discharge.  CAD stenting 2009 and 2020 in [place]. Cath 07/2021 patent stents RCA and mLAD with only mild nonobstructive disease. ASA allergy, no angina. On Plavix and metoprolol  PAF-isolated incident 09/2019 not on anticoag  HTN BP  controlled  HLD on crestor  PAD LBKA  COPD on 4LO2  Super morbid obesity  History of TIA           Dispo:  No follow-ups on file.   Medication Adjustments/Labs and Tests Ordered: Current medicines are reviewed at length with the patient today.  Concerns regarding medicines are outlined above.  Tests Ordered: Orders Placed This Encounter  Procedures   Basic Metabolic Panel (BMET)   Medication Changes:  Meds ordered this encounter  Medications   Torsemide 40 MG TABS    Sig: Take 40 mg by mouth daily.    Dispense:  90 tablet    Refill:  3   potassium chloride SA (KLOR-CON M) 20 MEQ tablet    Sig: Take 1 tablet (20 mEq total) by mouth daily.    Dispense:  90 tablet    Refill:  3   Signed, Ermalinda Barrios, PA-C  01/11/2022 2:35 PM    [address], [place], [place]  [address] Phone: [phone]; Fax: [phone]

## 2021-12-29 DIAGNOSIS — G4733 Obstructive sleep apnea (adult) (pediatric): Secondary | ICD-10-CM | POA: Diagnosis not present

## 2021-12-30 DIAGNOSIS — I509 Heart failure, unspecified: Secondary | ICD-10-CM | POA: Diagnosis not present

## 2021-12-30 DIAGNOSIS — J449 Chronic obstructive pulmonary disease, unspecified: Secondary | ICD-10-CM | POA: Diagnosis not present

## 2021-12-30 DIAGNOSIS — F419 Anxiety disorder, unspecified: Secondary | ICD-10-CM | POA: Insufficient documentation

## 2021-12-31 DIAGNOSIS — M6281 Muscle weakness (generalized): Secondary | ICD-10-CM | POA: Diagnosis not present

## 2022-01-11 ENCOUNTER — Encounter: Payer: Self-pay | Admitting: Physician Assistant

## 2022-01-11 ENCOUNTER — Ambulatory Visit: Payer: Medicare Other | Attending: Physician Assistant | Admitting: Physician Assistant

## 2022-01-11 ENCOUNTER — Other Ambulatory Visit (HOSPITAL_COMMUNITY)
Admission: RE | Admit: 2022-01-11 | Discharge: 2022-01-11 | Disposition: A | Discharge status: Home / Self Care | Payer: Medicare Other | Source: Ambulatory Visit | Attending: Physician Assistant | Admitting: Physician Assistant

## 2022-01-11 VITALS — BP 120/56 | HR 73 | Ht 64.0 in | Wt 288.0 lb

## 2022-01-11 DIAGNOSIS — I25118 Atherosclerotic heart disease of native coronary artery with other forms of angina pectoris: Secondary | ICD-10-CM | POA: Diagnosis not present

## 2022-01-11 DIAGNOSIS — J449 Chronic obstructive pulmonary disease, unspecified: Secondary | ICD-10-CM

## 2022-01-11 DIAGNOSIS — I5032 Chronic diastolic (congestive) heart failure: Secondary | ICD-10-CM

## 2022-01-11 DIAGNOSIS — Z79899 Other long term (current) drug therapy: Secondary | ICD-10-CM | POA: Insufficient documentation

## 2022-01-11 DIAGNOSIS — G459 Transient cerebral ischemic attack, unspecified: Secondary | ICD-10-CM

## 2022-01-11 DIAGNOSIS — Z89512 Acquired absence of left leg below knee: Secondary | ICD-10-CM

## 2022-01-11 DIAGNOSIS — Z6841 Body Mass Index (BMI) 40.0 and over, adult: Secondary | ICD-10-CM

## 2022-01-11 DIAGNOSIS — E785 Hyperlipidemia, unspecified: Secondary | ICD-10-CM

## 2022-01-11 DIAGNOSIS — I10 Essential (primary) hypertension: Secondary | ICD-10-CM

## 2022-01-11 LAB — BASIC METABOLIC PANEL
Anion gap: 12 (ref 5–15)
BUN: 34 mg/dL — ABNORMAL HIGH (ref 6–20)
CO2: 26 mmol/L (ref 22–32)
Calcium: 9.2 mg/dL (ref 8.9–10.3)
Chloride: 98 mmol/L (ref 98–111)
Creatinine, Ser: 1.4 mg/dL — ABNORMAL HIGH (ref 0.44–1.00)
GFR, Estimated: 44 mL/min — ABNORMAL LOW (ref >60)
Glucose, Bld: 423 mg/dL — ABNORMAL HIGH (ref 70–99)
Potassium: 3.8 mmol/L (ref 3.5–5.1)
Sodium: 136 mmol/L (ref 135–145)

## 2022-01-11 MED ORDER — POTASSIUM CHLORIDE CRYS ER 20 MEQ PO TBCR: 20.0000 meq | EXTENDED_RELEASE_TABLET | Freq: Every day | ORAL | 3 refills | Status: DC

## 2022-01-11 MED ORDER — TORSEMIDE 40 MG PO TABS: 40.0000 mg | ORAL_TABLET | Freq: Every day | ORAL | 3 refills | Status: DC

## 2022-01-11 NOTE — Patient Instructions (Signed)
Medication Instructions:  Your physician recommends that you continue on your current medications as directed. Please refer to the Current Medication list given to you today.  *If you need a refill on your cardiac medications before your next appointment, please call your pharmacy*   Lab Work: Your physician recommends that you return for lab work in: Today   If you have labs (blood work) drawn today and your tests are completely normal, you will receive your results only by: MyChart Message (if you have MyChart) OR A paper copy in the mail If you have any lab test that is abnormal or we need to change your treatment, we will call you to review the results.   Testing/Procedures: NONE    Follow-Up: At [facility], you and your health needs are our priority.  As part of our continuing mission to provide you with exceptional heart care, we have created designated Provider Care Teams.  These Care Teams include your primary Cardiologist (physician) and Advanced Practice Providers (APPs -  Physician Assistants and Nurse Practitioners) who all work together to provide you with the care you need, when you need it.  We recommend signing up for the patient portal called "MyChart".  Sign up information is provided on this After Visit Summary.  MyChart is used to connect with patients for Virtual Visits (Telemedicine).  Patients are able to view lab/test results, encounter notes, upcoming appointments, etc.  Non-urgent messages can be sent to your provider as well.   To learn more about what you can do with MyChart, go to [email or web address].    Your next appointment:   2-3 month(s)  The format for your next appointment:   In Person  Provider:   [name], MD    Other Instructions Thank you for choosing [facility]!    Important Information About Sugar

## 2022-01-12 ENCOUNTER — Telehealth: Payer: Self-pay

## 2022-01-12 NOTE — Telephone Encounter (Signed)
Patient notified and verbalized understanding. Patient will follow up with PCP concerning glucose. Pt had no questions or concerns at this time.

## 2022-01-12 NOTE — Telephone Encounter (Signed)
-----   Message from Dyann Kief, PA-C sent at 01/12/2022  7:59 AM EST ----- Glucose over 400-very high and needs better control. F/u with PCP. Kidney function better. Continue same meds. thanks

## 2022-01-13 DIAGNOSIS — E1165 Type 2 diabetes mellitus with hyperglycemia: Secondary | ICD-10-CM | POA: Diagnosis not present

## 2022-01-14 NOTE — Telephone Encounter (Signed)
New order will be placed when pt comes in to see Dr Stacie Acres in December

## 2022-01-19 DIAGNOSIS — J441 Chronic obstructive pulmonary disease with (acute) exacerbation: Secondary | ICD-10-CM | POA: Diagnosis not present

## 2022-01-20 ENCOUNTER — Other Ambulatory Visit: Payer: Self-pay

## 2022-01-20 ENCOUNTER — Telehealth: Payer: Self-pay | Admitting: Internal Medicine

## 2022-01-20 MED ORDER — TORSEMIDE 20 MG PO TABS: 40.0000 mg | ORAL_TABLET | Freq: Every day | ORAL | 3 refills | Status: DC

## 2022-01-20 NOTE — Telephone Encounter (Signed)
Pt c/o medication issue:  1. Name of Medication: Torsemide 40 MG TABS   2. How are you currently taking this medication (dosage and times per day)?   3. Are you having a reaction (difficulty breathing--STAT)?   4. What is your medication issue? Pt's pharmacy is requesting call back to see if they can change how this medication is taken.

## 2022-01-20 NOTE — Telephone Encounter (Signed)
Spoke to pharmacy- wanted to change from brand name torsemide to generic. Orders placed.

## 2022-01-23 DIAGNOSIS — J449 Chronic obstructive pulmonary disease, unspecified: Secondary | ICD-10-CM | POA: Diagnosis not present

## 2022-01-24 DIAGNOSIS — M6281 Muscle weakness (generalized): Secondary | ICD-10-CM | POA: Diagnosis not present

## 2022-01-30 DIAGNOSIS — M6281 Muscle weakness (generalized): Secondary | ICD-10-CM | POA: Diagnosis not present

## 2022-02-03 ENCOUNTER — Ambulatory Visit: Payer: Medicare Other | Admitting: Nurse Practitioner

## 2022-02-03 DIAGNOSIS — Z794 Long term (current) use of insulin: Secondary | ICD-10-CM

## 2022-02-03 DIAGNOSIS — I10 Essential (primary) hypertension: Secondary | ICD-10-CM

## 2022-02-03 DIAGNOSIS — E782 Mixed hyperlipidemia: Secondary | ICD-10-CM

## 2022-02-09 DIAGNOSIS — J449 Chronic obstructive pulmonary disease, unspecified: Secondary | ICD-10-CM | POA: Diagnosis not present

## 2022-02-10 ENCOUNTER — Ambulatory Visit: Payer: Medicare Other | Admitting: Podiatry

## 2022-02-12 DIAGNOSIS — E1165 Type 2 diabetes mellitus with hyperglycemia: Secondary | ICD-10-CM | POA: Diagnosis not present

## 2022-02-18 DIAGNOSIS — J441 Chronic obstructive pulmonary disease with (acute) exacerbation: Secondary | ICD-10-CM | POA: Diagnosis not present

## 2022-02-24 DIAGNOSIS — M6281 Muscle weakness (generalized): Secondary | ICD-10-CM | POA: Diagnosis not present

## 2022-03-02 DIAGNOSIS — M6281 Muscle weakness (generalized): Secondary | ICD-10-CM | POA: Diagnosis not present

## 2022-03-04 DIAGNOSIS — E1122 Type 2 diabetes mellitus with diabetic chronic kidney disease: Secondary | ICD-10-CM | POA: Diagnosis not present

## 2022-03-04 DIAGNOSIS — I10 Essential (primary) hypertension: Secondary | ICD-10-CM | POA: Diagnosis not present

## 2022-03-04 DIAGNOSIS — E559 Vitamin D deficiency, unspecified: Secondary | ICD-10-CM | POA: Diagnosis not present

## 2022-03-08 DIAGNOSIS — J449 Chronic obstructive pulmonary disease, unspecified: Secondary | ICD-10-CM | POA: Diagnosis not present

## 2022-03-09 ENCOUNTER — Ambulatory Visit: Payer: 59 | Admitting: Nurse Practitioner

## 2022-03-09 DIAGNOSIS — E1165 Type 2 diabetes mellitus with hyperglycemia: Secondary | ICD-10-CM

## 2022-03-09 DIAGNOSIS — E782 Mixed hyperlipidemia: Secondary | ICD-10-CM

## 2022-03-09 DIAGNOSIS — I10 Essential (primary) hypertension: Secondary | ICD-10-CM

## 2022-03-10 ENCOUNTER — Other Ambulatory Visit: Payer: Self-pay | Admitting: Nurse Practitioner

## 2022-03-11 DIAGNOSIS — E1165 Type 2 diabetes mellitus with hyperglycemia: Secondary | ICD-10-CM | POA: Diagnosis not present

## 2022-03-11 DIAGNOSIS — I130 Hypertensive heart and chronic kidney disease with heart failure and stage 1 through stage 4 chronic kidney disease, or unspecified chronic kidney disease: Secondary | ICD-10-CM | POA: Diagnosis not present

## 2022-03-11 DIAGNOSIS — I2510 Atherosclerotic heart disease of native coronary artery without angina pectoris: Secondary | ICD-10-CM | POA: Diagnosis not present

## 2022-03-11 DIAGNOSIS — E785 Hyperlipidemia, unspecified: Secondary | ICD-10-CM | POA: Diagnosis not present

## 2022-03-11 DIAGNOSIS — Z0000 Encounter for general adult medical examination without abnormal findings: Secondary | ICD-10-CM | POA: Diagnosis not present

## 2022-03-11 DIAGNOSIS — E1169 Type 2 diabetes mellitus with other specified complication: Secondary | ICD-10-CM | POA: Diagnosis not present

## 2022-03-11 DIAGNOSIS — Z0001 Encounter for general adult medical examination with abnormal findings: Secondary | ICD-10-CM | POA: Diagnosis not present

## 2022-03-11 DIAGNOSIS — J449 Chronic obstructive pulmonary disease, unspecified: Secondary | ICD-10-CM | POA: Diagnosis not present

## 2022-03-11 DIAGNOSIS — N182 Chronic kidney disease, stage 2 (mild): Secondary | ICD-10-CM | POA: Diagnosis not present

## 2022-03-11 DIAGNOSIS — E1122 Type 2 diabetes mellitus with diabetic chronic kidney disease: Secondary | ICD-10-CM | POA: Diagnosis not present

## 2022-03-11 DIAGNOSIS — I509 Heart failure, unspecified: Secondary | ICD-10-CM | POA: Diagnosis not present

## 2022-03-11 DIAGNOSIS — E559 Vitamin D deficiency, unspecified: Secondary | ICD-10-CM | POA: Diagnosis not present

## 2022-03-15 DIAGNOSIS — E1165 Type 2 diabetes mellitus with hyperglycemia: Secondary | ICD-10-CM | POA: Diagnosis not present

## 2022-03-21 DIAGNOSIS — J441 Chronic obstructive pulmonary disease with (acute) exacerbation: Secondary | ICD-10-CM | POA: Diagnosis not present

## 2022-03-27 DIAGNOSIS — M6281 Muscle weakness (generalized): Secondary | ICD-10-CM | POA: Diagnosis not present

## 2022-04-01 ENCOUNTER — Ambulatory Visit (INDEPENDENT_AMBULATORY_CARE_PROVIDER_SITE_OTHER): Payer: 59 | Admitting: Podiatry

## 2022-04-01 ENCOUNTER — Encounter: Payer: Self-pay | Admitting: Podiatry

## 2022-04-01 VITALS — BP 127/43 | HR 66

## 2022-04-01 DIAGNOSIS — M79674 Pain in right toe(s): Secondary | ICD-10-CM

## 2022-04-01 DIAGNOSIS — E0843 Diabetes mellitus due to underlying condition with diabetic autonomic (poly)neuropathy: Secondary | ICD-10-CM

## 2022-04-01 DIAGNOSIS — B351 Tinea unguium: Secondary | ICD-10-CM

## 2022-04-01 NOTE — Progress Notes (Signed)
This patient returns to my office for at risk foot care.  This patient requires this care by a professional since this patient will be at risk due to having diabetes and amputation left foot.  This patient is unable to cut nails herself since the patient cannot reach her nails.These nails are painful walking and wearing shoes.  This patient presents for at risk foot care today.  General Appearance  Alert, conversant and in no acute stress.  Vascular  Dorsalis pedis and posterior tibial  pulses are  weakly palpable  right.  Capillary return is within normal limits  . Temperature is within normal limits.  Neurologic  Senn-Weinstein monofilament wire test diminished right foot.. Muscle power within normal limits bilaterally.  Nails Thick disfigured discolored nails with subungual debris  from hallux to fifth toes right . No evidence of bacterial infection or drainage bilaterally.  Orthopedic  No limitations of motion  feet .  No crepitus or effusions noted.  No bony pathology or digital deformities noted.  Skin  normotropic skin with no porokeratosis noted bilaterally.  No signs of infections or ulcers noted.     Onychomycosis  Pain in right toes  Pain in left toes  Consent was obtained for treatment procedures.   Mechanical debridement of nails 1-5  bilaterally performed with a nail nipper.  Filed with dremel without incident.    Return office visit   3 months                   Told patient to return for periodic foot care and evaluation due to potential at risk complications.   Gardiner Barefoot DPM

## 2022-04-02 DIAGNOSIS — M6281 Muscle weakness (generalized): Secondary | ICD-10-CM | POA: Diagnosis not present

## 2022-04-04 NOTE — Progress Notes (Unsigned)
Cardiology Office Note   Date:  04/05/2022   ID:  Nicole, [name] 1966-02-09, MRN [id number]  PCP:  Celene Squibb, MD  Cardiologist:   Dorris Carnes, MD   Patient presents for follow up of CAD    History of Present Illness: Nicole [name] is a 57 y.o. female with a history of HTN, Type II DM (x 30 years), COPD, RBBB, HL, obesity and CAD.  S/P PTCA stent 2009 ([facility]) Echo in 2013 LVEF normal  Myoview in May 2013 was normal    Pt moved away  to [place]    While there had 2 more stents placed [employer])   She says she was also noted to have an irregular herart beat that was treated with meds    I saw the pt in clinic in 2022    In Jun 2023 she had a L heart cath that showed patient stents to RCA and LAD      Since seen she her breathing is getting better   Still using O2 Denies CP  Denies signficant palpitatations  Ran out of Crestor   Back on it now     Due to have labs in April Endocrine helping with sugars    Current Meds  Medication Sig   ACCU-CHEK GUIDE test strip USE 1 STRIP TO CHECK GLUCOSE 4 TIMES DAILY   acetaminophen (TYLENOL) 500 MG tablet Take 1 tablet (500 mg total) by mouth every 6 (six) hours as needed for moderate pain.   albuterol (PROVENTIL) (2.5 MG/3ML) 0.083% nebulizer solution Take 3 mLs (2.5 mg total) by nebulization every 6 (six) hours as needed for wheezing.   albuterol (VENTOLIN HFA) 108 (90 Base) MCG/ACT inhaler Inhale 2 puffs into the lungs every 6 (six) hours as needed for wheezing. Shortness of breath   blood glucose meter kit and supplies Dispense based on patient and insurance preference. Use four times daily as directed. (FOR ICD-10 E10.9, E11.9).   buPROPion ER (WELLBUTRIN SR) 100 MG 12 hr tablet Take 100 mg by mouth daily.   clopidogrel (PLAVIX) 75 MG tablet Take 1 tablet (75 mg total) by mouth daily.   dapagliflozin propanediol (FARXIGA) 10 MG TABS tablet Take 1 tablet (10 mg total) by mouth daily.   EASY TOUCH INSULIN SYRINGE 31G X  5/16" 1 ML MISC    EPINEPHrine 0.3 mg/0.3 mL IJ SOAJ injection Inject 0.3 mg into the muscle as needed for anaphylaxis.   Insulin Pen Needle (B-D ULTRAFINE III SHORT PEN) 31G X 8 MM MISC USE AS DIRECTED   insulin regular human CONCENTRATED (HUMULIN R U-500 KWIKPEN) 500 UNIT/ML KwikPen Inject 80 Units into the skin 3 (three) times daily with meals.   KERENDIA 10 MG TABS Take 1 tablet by mouth daily.   loperamide (IMODIUM) 2 MG capsule Take 1 capsule (2 mg total) by mouth every 6 (six) hours as needed for diarrhea or loose stools.   metoprolol tartrate (LOPRESSOR) 25 MG tablet Take 1 tablet (25 mg total) by mouth 2 (two) times daily.   Multiple Vitamins-Calcium (ONE-A-DAY WOMENS FORMULA) TABS Take 1 tablet by mouth daily.   nitroGLYCERIN (NITROSTAT) 0.4 MG SL tablet Take one tablet every 5 minutes as needed for chest pains up to 3 tablets. (Patient taking differently: Place 0.4 mg under the tongue every 5 (five) minutes as needed for chest pain (every 5 minutes as needed for chest pains up to 3 tablets.).)   Nystatin (GERHARDT'S BUTT CREAM) CREA Apply 1 Application  topically 2 (two) times daily.   nystatin powder Apply 1 Application topically daily as needed (irritation).   pantoprazole (PROTONIX) 40 MG tablet Take 1 tablet (40 mg total) by mouth daily. (Patient taking differently: Take 40 mg by mouth at bedtime.)   potassium chloride SA (KLOR-CON M) 20 MEQ tablet Take 1 tablet (20 mEq total) by mouth daily.   rosuvastatin (CRESTOR) 40 MG tablet Take 40 mg by mouth at bedtime.   Semaglutide, 1 MG/DOSE, 4 MG/3ML SOPN Inject 1 mg as directed once a week.   torsemide (DEMADEX) 20 MG tablet Take 2 tablets (40 mg total) by mouth daily.   Vitamin D, Ergocalciferol, (DRISDOL) 1.25 MG (50000 UNIT) CAPS capsule Take 50,000 Units by mouth every Monday.   VRAYLAR 3 MG capsule Take 3 mg by mouth daily.     Allergies:   Aspirin, Bee venom, Pineapple, Definity [perflutren lipid microsphere], and E-mycin  [erythromycin base]   Past Medical History:  Diagnosis Date   CAP (community acquired pneumonia)    Streptococcus 01/2011   COPD (chronic obstructive pulmonary disease) (HCC)    Coronary atherosclerosis of native coronary artery    a. Diagnosed [place] 2006 - DES RCA, reports followup cath [facility] at [facility] b. reported 2 stents in [place] in 2020. c. 07/2021: cath showing patent stents along RCA and mid-LAD with scattered 20% stenosis but no obstructive disease.   Dyslipidemia    Essential hypertension, benign    Morbid obesity ([place])    Pancreatitis    Type 2 diabetes mellitus ([place])     Past Surgical History:  Procedure Laterality Date   ABDOMINAL HERNIA REPAIR     ABDOMINAL HYSTERECTOMY     AMPUTATION Left 01/25/2020   Procedure: LEFT BELOW KNEE AMPUTATION;  Surgeon: Newt Minion, MD;  Location: [facility];  Service: Orthopedics;  Laterality: Left;   CORONARY ANGIOPLASTY WITH STENT PLACEMENT  2006   KNEE SURGERY     LEFT HEART CATH AND CORONARY ANGIOGRAPHY N/A 08/14/2021   Procedure: LEFT HEART CATH AND CORONARY ANGIOGRAPHY;  Surgeon: Troy Sine, MD;  Location: [facility];  Service: Cardiovascular;  Laterality: N/A;   NOSE SURGERY     Pilonidal cystectomy     TONSILLECTOMY       Social History:  The patient  reports that she quit smoking about 2 years ago. Her smoking use included cigarettes. She has a 10.00 pack-year smoking history. She has never used smokeless tobacco. She reports that she does not drink alcohol and does not use drugs.   Family History:  The patient's family history includes Asthma in her son; Diabetes in her mother; Heart failure in her mother; Hypertension in her mother; Kidney failure in her mother; Ovarian cancer in her mother.    ROS:  Please see the history of present illness. All other systems are reviewed and  Negative to the above problem except as noted.    PHYSICAL EXAM: VS:  BP 134/66   Pulse 95   Ht 5' 3"$  (1.6 m)   Wt 290 lb  (131.5 kg)   SpO2 98%   BMI 51.37 kg/m   GEN: Morbidly obese 57 yo  in no acute distress  HEENT: normal  Neck: no JVD, carotid bruits, Cardiac: RRR; no murmurs,  Tr LE edema  Respiratory:  clear to auscultation bilaterally   GI: soft, nontender, nondistended, + BS  No hepatomegaly  MS: no deformity Moving all extremities   Skin: warm and dry, no rash Neuro:  Strength and sensation are intact Psych: euthymic mood, full affect   EKG:  EKG is not ordered today.   Lipid Panel    Component Value Date/Time   CHOL 109 12/15/2021 0551   TRIG 304 (H) 12/15/2021 0551   HDL 25 (L) 12/15/2021 0551   CHOLHDL 4.4 12/15/2021 0551   VLDL 61 (H) 12/15/2021 0551   LDLCALC 23 12/15/2021 0551   LDLCALC 65 12/25/2019 1359      Wt Readings from Last 3 Encounters:  04/05/22 290 lb (131.5 kg)  01/11/22 288 lb (130.6 kg)  12/17/21 299 lb 6.2 oz (135.8 kg)      ASSESSMENT AND PLAN:  1   CAD  Pt with remote interventions.   Cth in fall 2023 showed patent stents    PT without CP   Follow   Keep on Plavix  Not on ASA   3   Arrhythmia  PT had been on eliquis   No documented arrhythmias here   [place]       4  HL  Last LDL 178   She has just restarted statin   WIll need to follow    5  HTN  BP is fair  Follow  6 DM  Discussed diet A1C 10.9   7   Morbid obesity   Discussed diet sum  Mediterranian diet        Current medicines are reviewed at length with the patient today.  The patient does not have concerns regarding medicines.  Signed, Dorris Carnes, MD  04/05/2022 9:32 PM    [facility] [address], [place], [place]  [address] Phone: [phone]; Fax: [phone]

## 2022-04-05 ENCOUNTER — Encounter: Payer: Self-pay | Admitting: Internal Medicine

## 2022-04-05 ENCOUNTER — Ambulatory Visit: Payer: 59 | Attending: Internal Medicine | Admitting: Internal Medicine

## 2022-04-05 VITALS — BP 134/66 | HR 95 | Ht 63.0 in | Wt 290.0 lb

## 2022-04-05 DIAGNOSIS — I2510 Atherosclerotic heart disease of native coronary artery without angina pectoris: Secondary | ICD-10-CM

## 2022-04-05 NOTE — Patient Instructions (Signed)
Medication Instructions:   Your physician recommends that you continue on your current medications as directed. Please refer to the Current Medication list given to you today.  *If you need a refill on your cardiac medications before your next appointment, please call your pharmacy*   Lab Work: NONE   If you have labs (blood work) drawn today and your tests are completely normal, you will receive your results only by: [place] (if you have MyChart) OR A paper copy in the mail If you have any lab test that is abnormal or we need to change your treatment, we will call you to review the results.   Testing/Procedures: NONE    Follow-Up: At [facility], you and your health needs are our priority.  As part of our continuing mission to provide you with exceptional heart care, we have created designated Provider Care Teams.  These Care Teams include your primary Cardiologist (physician) and Advanced Practice Providers (APPs -  Physician Assistants and Nurse Practitioners) who all work together to provide you with the care you need, when you need it.  We recommend signing up for the patient portal called "MyChart".  Sign up information is provided on this After Visit Summary.  MyChart is used to connect with patients for Virtual Visits (Telemedicine).  Patients are able to view lab/test results, encounter notes, upcoming appointments, etc.  Non-urgent messages can be sent to your provider as well.   To learn more about what you can do with MyChart, go to [email or web address].    Your next appointment:    November   Provider:   Dorris Carnes, MD    Other Instructions Thank you for choosing [facility]!

## 2022-04-07 DIAGNOSIS — J449 Chronic obstructive pulmonary disease, unspecified: Secondary | ICD-10-CM | POA: Diagnosis not present

## 2022-04-12 DIAGNOSIS — G4733 Obstructive sleep apnea (adult) (pediatric): Secondary | ICD-10-CM | POA: Diagnosis not present

## 2022-04-15 DIAGNOSIS — E1165 Type 2 diabetes mellitus with hyperglycemia: Secondary | ICD-10-CM | POA: Diagnosis not present

## 2022-04-21 DIAGNOSIS — J441 Chronic obstructive pulmonary disease with (acute) exacerbation: Secondary | ICD-10-CM | POA: Diagnosis not present

## 2022-05-01 DIAGNOSIS — M6281 Muscle weakness (generalized): Secondary | ICD-10-CM | POA: Diagnosis not present

## 2022-05-06 ENCOUNTER — Ambulatory Visit (INDEPENDENT_AMBULATORY_CARE_PROVIDER_SITE_OTHER): Payer: 59 | Admitting: Nurse Practitioner

## 2022-05-06 ENCOUNTER — Encounter: Payer: Self-pay | Admitting: Nurse Practitioner

## 2022-05-06 VITALS — BP 122/74 | HR 62 | Ht 63.0 in

## 2022-05-06 DIAGNOSIS — E782 Mixed hyperlipidemia: Secondary | ICD-10-CM | POA: Diagnosis not present

## 2022-05-06 DIAGNOSIS — I10 Essential (primary) hypertension: Secondary | ICD-10-CM | POA: Diagnosis not present

## 2022-05-06 DIAGNOSIS — E1165 Type 2 diabetes mellitus with hyperglycemia: Secondary | ICD-10-CM | POA: Diagnosis not present

## 2022-05-06 DIAGNOSIS — J449 Chronic obstructive pulmonary disease, unspecified: Secondary | ICD-10-CM | POA: Diagnosis not present

## 2022-05-06 DIAGNOSIS — Z794 Long term (current) use of insulin: Secondary | ICD-10-CM | POA: Diagnosis not present

## 2022-05-06 MED ORDER — SEMAGLUTIDE (2 MG/DOSE) 8 MG/3ML ~~LOC~~ SOPN: 2.0000 mg | PEN_INJECTOR | SUBCUTANEOUS | 3 refills | Status: DC

## 2022-05-06 NOTE — Progress Notes (Signed)
Endocrinology Follow Up Note       05/06/2022, 3:33 PM   Subjective:    Patient ID: Nicole [name], female    DOB: 1965-04-03.  Nicole [name] is being seen in follow up after being seen in consultation for management of currently uncontrolled symptomatic diabetes requested by  Nicole Squibb, MD.   Past Medical History:  Diagnosis Date   CAP (community acquired pneumonia)    Streptococcus 01/2011   COPD (chronic obstructive pulmonary disease) ([place])    Coronary atherosclerosis of native coronary artery    a. Diagnosed [place] 2006 - DES RCA, reports followup cath 2009 at [facility] b. reported 2 stents in [place] in 2020. c. 07/2021: cath showing patent stents along RCA and mid-LAD with scattered 20% stenosis but no obstructive disease.   Dyslipidemia    Essential hypertension, benign    Morbid obesity ([place])    Pancreatitis    Type 2 diabetes mellitus ([place])     Past Surgical History:  Procedure Laterality Date   ABDOMINAL HERNIA REPAIR     ABDOMINAL HYSTERECTOMY     AMPUTATION Left 01/25/2020   Procedure: LEFT BELOW KNEE AMPUTATION;  Surgeon: Nicole Minion, MD;  Location: [place] [facility];  Service: Orthopedics;  Laterality: Left;   CORONARY ANGIOPLASTY WITH STENT PLACEMENT  2006   KNEE SURGERY     LEFT HEART CATH AND CORONARY ANGIOGRAPHY N/A 08/14/2021   Procedure: LEFT HEART CATH AND CORONARY ANGIOGRAPHY;  Surgeon: Nicole Sine, MD;  Location: [facility];  Service: Cardiovascular;  Laterality: N/A;   NOSE SURGERY     Pilonidal cystectomy     TONSILLECTOMY      Social History   Socioeconomic History   Marital status: Divorced    Spouse name: Not on file   Number of children: 3   Years of education: Not on file   Highest education level: Not on file  Occupational History   Occupation: Home health aide    Employer: [employer]  Tobacco Use   Smoking status: Former    Packs/day:  0.50    Years: 20.00    Additional pack years: 0.00    Total pack years: 10.00    Types: Cigarettes    Quit date: 01/22/2020    Years since quitting: 2.2   Smokeless tobacco: Never  Vaping Use   Vaping Use: Never used  Substance and Sexual Activity   Alcohol use: No   Drug use: No   Sexual activity: Yes    Birth control/protection: Surgical  Other Topics Concern   Not on file  Social History Narrative   Not on file   Social Determinants of Health   Financial Resource Strain: Not on file  Food Insecurity: No Food Insecurity (12/11/2021)   Hunger Vital Sign    Worried About Running Out of Food in the Last Year: Never true    Ran Out of Food in the Last Year: Never true  Transportation Needs: No Transportation Needs (12/11/2021)   PRAPARE - Hydrologist (Medical): No    Lack of Transportation (Non-Medical): No  Physical Activity: Not on file  Stress: Not on  file  Social Connections: Not on file    Family History  Problem Relation Age of Onset   Diabetes Mother    Heart failure Mother    Hypertension Mother    Kidney failure Mother    Ovarian cancer Mother    Asthma Son     Outpatient Encounter Medications as of 05/06/2022  Medication Sig   ACCU-CHEK GUIDE test strip USE 1 STRIP TO CHECK GLUCOSE 4 TIMES DAILY   acetaminophen (TYLENOL) 500 MG tablet Take 1 tablet (500 mg total) by mouth every 6 (six) hours as needed for moderate pain.   albuterol (PROVENTIL) (2.5 MG/3ML) 0.083% nebulizer solution Take 3 mLs (2.5 mg total) by nebulization every 6 (six) hours as needed for wheezing.   albuterol (VENTOLIN HFA) 108 (90 Base) MCG/ACT inhaler Inhale 2 puffs into the lungs every 6 (six) hours as needed for wheezing. Shortness of breath   blood glucose meter kit and supplies Dispense based on patient and insurance preference. Use four times daily as directed. (FOR ICD-10 E10.9, E11.9).   buPROPion ER (WELLBUTRIN SR) 100 MG 12 hr tablet Take 100 mg by  mouth daily.   clopidogrel (PLAVIX) 75 MG tablet Take 1 tablet (75 mg total) by mouth daily.   dapagliflozin propanediol (FARXIGA) 10 MG TABS tablet Take 1 tablet (10 mg total) by mouth daily.   EASY TOUCH INSULIN SYRINGE 31G X 5/16" 1 ML MISC    EPINEPHrine 0.3 mg/0.3 mL IJ SOAJ injection Inject 0.3 mg into the muscle as needed for anaphylaxis.   Insulin Pen Needle (B-D ULTRAFINE III SHORT PEN) 31G X 8 MM MISC USE AS DIRECTED   insulin regular human CONCENTRATED (HUMULIN R U-500 KWIKPEN) 500 UNIT/ML KwikPen Inject 80 Units into the skin 3 (three) times daily with meals.   KERENDIA 10 MG TABS Take 1 tablet by mouth daily.   loperamide (IMODIUM) 2 MG capsule Take 1 capsule (2 mg total) by mouth every 6 (six) hours as needed for diarrhea or loose stools.   metoprolol tartrate (LOPRESSOR) 25 MG tablet Take 1 tablet (25 mg total) by mouth 2 (two) times daily.   Multiple Vitamins-Calcium (ONE-A-DAY WOMENS FORMULA) TABS Take 1 tablet by mouth daily.   nitroGLYCERIN (NITROSTAT) 0.4 MG SL tablet Take one tablet every 5 minutes as needed for chest pains up to 3 tablets. (Patient taking differently: Place 0.4 mg under the tongue every 5 (five) minutes as needed for chest pain (every 5 minutes as needed for chest pains up to 3 tablets.).)   Nystatin (GERHARDT'S BUTT CREAM) CREA Apply 1 Application topically 2 (two) times daily.   nystatin powder Apply 1 Application topically daily as needed (irritation).   pantoprazole (PROTONIX) 40 MG tablet Take 1 tablet (40 mg total) by mouth daily. (Patient taking differently: Take 40 mg by mouth at bedtime.)   potassium chloride SA (KLOR-CON M) 20 MEQ tablet Take 1 tablet (20 mEq total) by mouth daily.   rosuvastatin (CRESTOR) 40 MG tablet Take 40 mg by mouth at bedtime.   Semaglutide, 2 MG/DOSE, 8 MG/3ML SOPN Inject 2 mg as directed once a week.   torsemide (DEMADEX) 20 MG tablet Take 2 tablets (40 mg total) by mouth daily.   Vitamin D, Ergocalciferol, (DRISDOL) 1.25  MG (50000 UNIT) CAPS capsule Take 50,000 Units by mouth every Monday.   VRAYLAR 3 MG capsule Take 3 mg by mouth daily.   [DISCONTINUED] Semaglutide, 1 MG/DOSE, 4 MG/3ML SOPN Inject 1 mg as directed once a week.  No facility-administered encounter medications on file as of 05/06/2022.    ALLERGIES: Allergies  Allergen Reactions   Aspirin Anaphylaxis and Other (See Comments)    Throat closing    Bee Venom Anaphylaxis   Pineapple Anaphylaxis   Definity [Perflutren Lipid Microsphere]     Muscle Aches   E-Mycin [Erythromycin Base] Nausea And Vomiting    VACCINATION STATUS: Immunization History  Administered Date(s) Administered   Influenza,inj,Quad PF,6+ Mos 12/25/2019, 12/23/2020   Moderna Sars-Covid-2 Vaccination 06/25/2019, 07/25/2019   Pneumococcal Polysaccharide-23 04/11/2012, 12/25/2019    Diabetes She presents for her follow-up diabetic visit. She has type 2 diabetes mellitus. Onset time: diagnosed at approx age of 88. Her disease course has been fluctuating. There are no hypoglycemic associated symptoms. Associated symptoms include blurred vision, fatigue, foot paresthesias, polydipsia and polyuria. There are no hypoglycemic complications. Symptoms are stable. Diabetic complications include heart disease, peripheral neuropathy and PVD. (Hx Left BKA, MI) Risk factors for coronary artery disease include diabetes mellitus, dyslipidemia, family history, obesity, hypertension and sedentary lifestyle. Current diabetic treatment includes intensive insulin program and oral agent (monotherapy) (and Ozempic). She is compliant with treatment most of the time. Her weight is fluctuating dramatically. She is following a generally unhealthy diet. When asked about meal planning, she reported none. She has not had a previous visit with a dietitian. She rarely participates in exercise. Her home blood glucose trend is fluctuating minimally. Her breakfast blood glucose range is generally >200 mg/dl. Her  lunch blood glucose range is generally >200 mg/dl. Her dinner blood glucose range is generally >200 mg/dl. Her bedtime blood glucose range is generally >200 mg/dl. (She presents today, accompanied by her family, with her CGM showing above target glycemic profile overall.  Her most recent A1c, checked during recent hospitalization for CHF exacerbation was 10.9%, increasing from last visit of 9.5%.  She notes she has been sick on/off over the holiday season.  During her recent hospitalization, she was taken off Glipizide and put on Farxiga.  Analysis of her CGM shows TIR 9%, TAR 91% (69% in very high range), TBR 0% with a GMI of 10.5%.) An ACE inhibitor/angiotensin II receptor blocker is being taken. She sees a podiatrist.Eye exam is current.  Hypertension This is a chronic problem. The current episode started more than 1 year ago. The problem has been gradually improving since onset. The problem is controlled. Associated symptoms include blurred vision. There are no associated agents to hypertension. Risk factors for coronary artery disease include diabetes mellitus, dyslipidemia, family history, obesity and sedentary lifestyle. Past treatments include ACE inhibitors, beta blockers and diuretics. The current treatment provides mild improvement. Compliance problems include diet and exercise.  Hypertensive end-organ damage includes CAD/MI, heart failure and PVD.  Hyperlipidemia This is a chronic problem. The current episode started more than 1 year ago. The problem is uncontrolled. Recent lipid tests were reviewed and are variable. Exacerbating diseases include diabetes and obesity. Factors aggravating her hyperlipidemia include beta blockers. Current antihyperlipidemic treatment includes statins. The current treatment provides mild improvement of lipids. Compliance problems include adherence to diet and adherence to exercise.  Risk factors for coronary artery disease include diabetes mellitus, dyslipidemia, family  history, obesity, hypertension and a sedentary lifestyle.     Review of systems  Constitutional: + Minimally fluctuating body weight, current Body mass index is 51.37 kg/m., + fatigue, no subjective hyperthermia, no subjective hypothermia Eyes: + blurry vision, no xerophthalmia ENT: no sore throat, no nodules palpated in throat, no dysphagia/odynophagia, no hoarseness Cardiovascular: no  chest pain, no shortness of breath, no palpitations, + leg swelling Respiratory: no cough, + shortness of breath- hx COPD on chronic O2 Gastrointestinal: no nausea/vomiting/diarrhea Musculoskeletal: no muscle/joint aches, WC bound due to hx of L BKA- has prosthesis Skin: no rashes, no hyperemia, Neurological: no tremors, no numbness, no tingling, no dizziness Psychiatric: no depression, no anxiety  Objective:     BP 122/74 (BP Location: Left Arm, Patient Position: Sitting, Cuff Size: Large)   Pulse 62   Ht '5\' 3"'$  (1.6 m)   BMI 51.37 kg/m   Wt Readings from Last 3 Encounters:  04/05/22 290 lb (131.5 kg)  01/11/22 288 lb (130.6 kg)  12/17/21 299 lb 6.2 oz (135.8 kg)     BP Readings from Last 3 Encounters:  05/06/22 122/74  04/05/22 134/66  04/01/22 (!) 127/43     Physical Exam- Limited  Constitutional:  Body mass index is 51.37 kg/m. , not in acute distress, normal state of mind Eyes:  EOMI, no exophthalmos Musculoskeletal: WC bound- hx L BKA with prosthesis Skin:  no rashes, no hyperemia Neurological: no tremor with outstretched hands   Diabetic Foot Exam - Simple   No data filed    CMP ( most recent) CMP     Component Value Date/Time   NA 136 01/11/2022 1441   K 3.8 01/11/2022 1441   CL 98 01/11/2022 1441   CO2 26 01/11/2022 1441   GLUCOSE 423 (H) 01/11/2022 1441   BUN 34 (H) 01/11/2022 1441   CREATININE 1.40 (H) 01/11/2022 1441   CREATININE 0.86 07/30/2020 1558   CALCIUM 9.2 01/11/2022 1441   PROT 6.9 12/08/2021 0913   ALBUMIN 3.2 (L) 12/08/2021 0913   AST 26  12/08/2021 0913   ALT 19 12/08/2021 0913   ALKPHOS 79 12/08/2021 0913   BILITOT 0.4 12/08/2021 0913   GFRNONAA 44 (L) 01/11/2022 1441   GFRNONAA 76 07/30/2020 1558   GFRAA 88 07/30/2020 1558     Diabetic Labs (most recent): Lab Results  Component Value Date   HGBA1C 9.5 (H) 12/08/2021   HGBA1C 14.3 09/29/2021   HGBA1C 13.1 (H) 06/17/2021     Lipid Panel ( most recent) Lipid Panel     Component Value Date/Time   CHOL 109 12/15/2021 0551   TRIG 304 (H) 12/15/2021 0551   HDL 25 (L) 12/15/2021 0551   CHOLHDL 4.4 12/15/2021 0551   VLDL 61 (H) 12/15/2021 0551   LDLCALC 23 12/15/2021 0551   LDLCALC 65 12/25/2019 1359      Lab Results  Component Value Date   TSH 2.06 12/25/2019   TSH 2.800 04/11/2012   TSH 4.814 (H) 11/10/2011   TSH 2.672 02/19/2011   FREET4 1.2 12/25/2019           Assessment & Plan:   1) Type 2 diabetes mellitus with hyperglycemia, with long-term current use of insulin ([place])  She presents today, accompanied by her family, with her CGM showing above target glycemic profile overall.  Her most recent A1c, checked during recent hospitalization for CHF exacerbation was 10.9%, increasing from last visit of 9.5%.  She notes she has been sick on/off over the holiday season.  During her recent hospitalization, she was taken off Glipizide and put on Farxiga.  Analysis of her CGM shows TIR 9%, TAR 91% (69% in very high range), TBR 0% with a GMI of 10.5%.  - Nicole [name] has currently uncontrolled symptomatic type 2 DM since 57 years of age.   -Recent labs reviewed.  -  I had a long discussion with her about the progressive nature of diabetes and the pathology behind its complications. -her diabetes is complicated by PVD- L BKA, MI, CAD, CHF and she remains at a high risk for more acute and chronic complications which include CAD, CVA, CKD, retinopathy, and neuropathy. These are all discussed in detail with her.  - Nutritional counseling repeated at each  appointment due to patients tendency to fall back in to old habits.  - The patient admits there is a room for improvement in their diet and drink choices. -  Suggestion is made for the patient to avoid simple carbohydrates from their diet including Cakes, Sweet Desserts / Pastries, Ice Cream, Soda (diet and regular), Sweet Tea, Candies, Chips, Cookies, Sweet Pastries, Store Bought Juices, Alcohol in Excess of 1-2 drinks a day, Artificial Sweeteners, Coffee Creamer, and "Sugar-free" Products. This will help patient to have stable blood glucose profile and potentially avoid unintended weight gain.   - I encouraged the patient to switch to unprocessed or minimally processed complex starch and increased protein intake (animal or plant source), fruits, and vegetables.   - Patient is advised to stick to a routine mealtimes to eat 3 meals a day and avoid unnecessary snacks (to snack only to correct hypoglycemia).  - she will be scheduled with Jearld Fenton, RDN, CDE for diabetes education.  - I have approached her with the following individualized plan to manage her diabetes and patient agrees:   -She is advised to continue U500 80 units TID with meals if glucose is above 90 and she is eating, continue Farxiga 10 mg po daily, and increase her Ozempic to 2 mg SQ weekly.   -she is encouraged to continue monitoring glucose 4 times daily, before meals and before bed (using her CGM), and to call the clinic if she has readings less than 70 or above 300 for 3 tests in a row.    - she is warned not to take insulin without proper monitoring per orders. - Adjustment parameters are given to her for hypo and hyperglycemia in writing.  - Specific targets for  A1c; LDL, HDL, and Triglycerides were discussed with the patient.  2) Blood Pressure /Hypertension:  her blood pressure is controlled to target.   she is advised to continue her current medications including Lasix 80 mg po daily, Lisinopril 2.5 mg p.o.  daily with breakfast, and Metoprolol 50 mg po twice daily.  3) Lipids/Hyperlipidemia:    Review of her recent lipid panel from 03/04/22 showed uncontrolled LDL at 1781 and elevated triglycerides of 544 (worsening).  she is advised to continue Crestor 40 mg daily at bedtime.  Side effects and precautions discussed with her.  She is also advised to avoid fried foods and butter.  4)  Weight/Diet:  her Body mass index is 51.37 kg/m.  -  clearly complicating her diabetes care.   she is a candidate for weight loss. I discussed with her the fact that loss of 5 - 10% of her  current body weight will have the most impact on her diabetes management.  Exercise, and detailed carbohydrates information provided  -  detailed on discharge instructions.  5) Chronic Care/Health Maintenance: -she is on ACEI/ARB and Statin medications and is encouraged to initiate and continue to follow up with Ophthalmology, Dentist, Podiatrist at least yearly or according to recommendations, and advised to stay away from smoking. I have recommended yearly flu vaccine and pneumonia vaccine at least every 5 years; moderate intensity  exercise for up to 150 minutes weekly; and sleep for at least 7 hours a day.  - she is advised to maintain close follow up with Nicole Squibb, MD for primary care needs, as well as her other providers for optimal and coordinated care.      I spent  30  minutes in the care of the patient today including review of labs from [facility], Lipids, Thyroid Function, Hematology (current and previous including abstractions from other facilities); face-to-face time discussing  her blood glucose readings/logs, discussing hypoglycemia and hyperglycemia episodes and symptoms, medications doses, her options of short and long term treatment based on the latest standards of care / guidelines;  discussion about incorporating lifestyle medicine;  and documenting the encounter. Risk reduction counseling performed per USPSTF  guidelines to reduce obesity and cardiovascular risk factors.     Please refer to Patient Instructions for Blood Glucose Monitoring and Insulin/Medications Dosing Guide"  in media tab for additional information. Please  also refer to " Patient Self Inventory" in the Media  tab for reviewed elements of pertinent patient history.  Vilinda Blanks Lasater participated in the discussions, expressed understanding, and voiced agreement with the above plans.  All questions were answered to her satisfaction. she is encouraged to contact clinic should she have any questions or concerns prior to her return visit.     Follow up plan: - Return in about 3 months (around 08/06/2022) for Diabetes F/U with A1c in office, No previsit labs, Bring meter and logs.   Rayetta Pigg, [facility] [address] [place],  [address] Phone: [phone] Fax: [phone]  05/06/2022, 3:33 PM

## 2022-05-07 IMAGING — DX DG FOOT COMPLETE 3+V*R*
3 series · 3 of 3 positions shown · non-contrast
Comparison: 08/14/2010

CLINICAL DATA: Right great toe wound

EXAM:
RIGHT FOOT COMPLETE - 3+ VIEW

[foot ap]
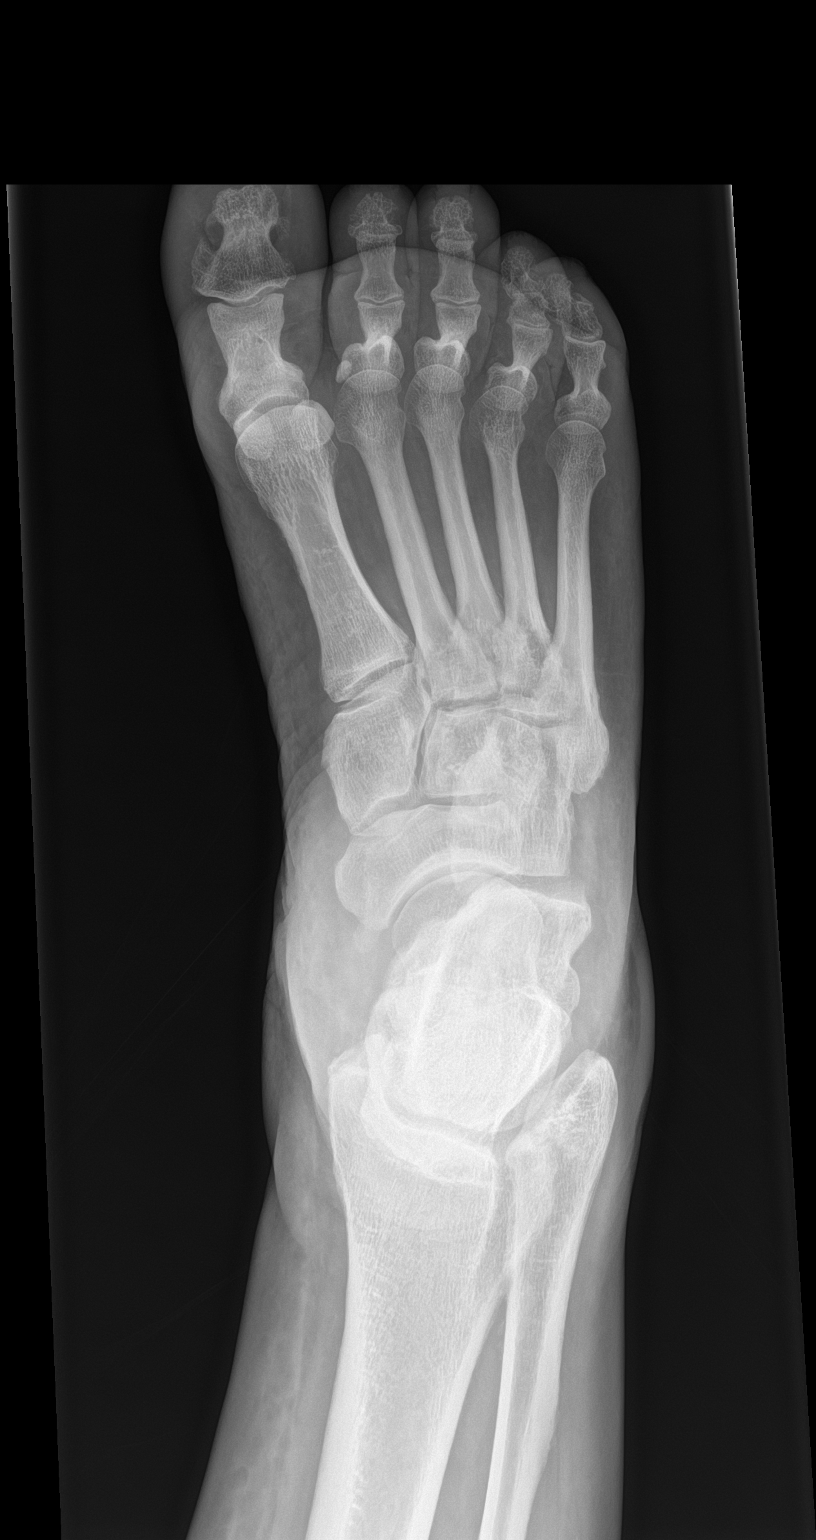

[foot obl]
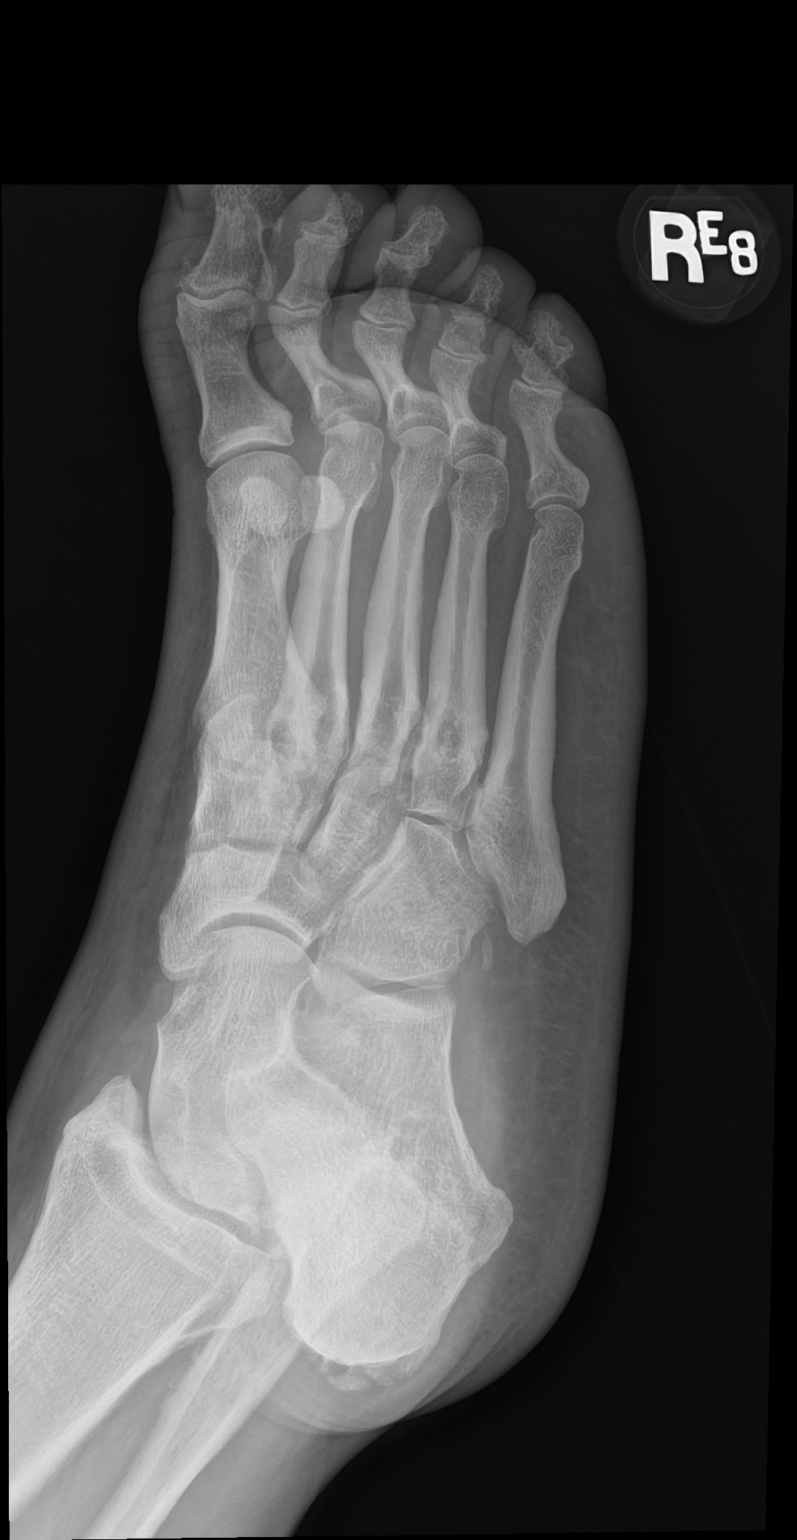

[foot lat]
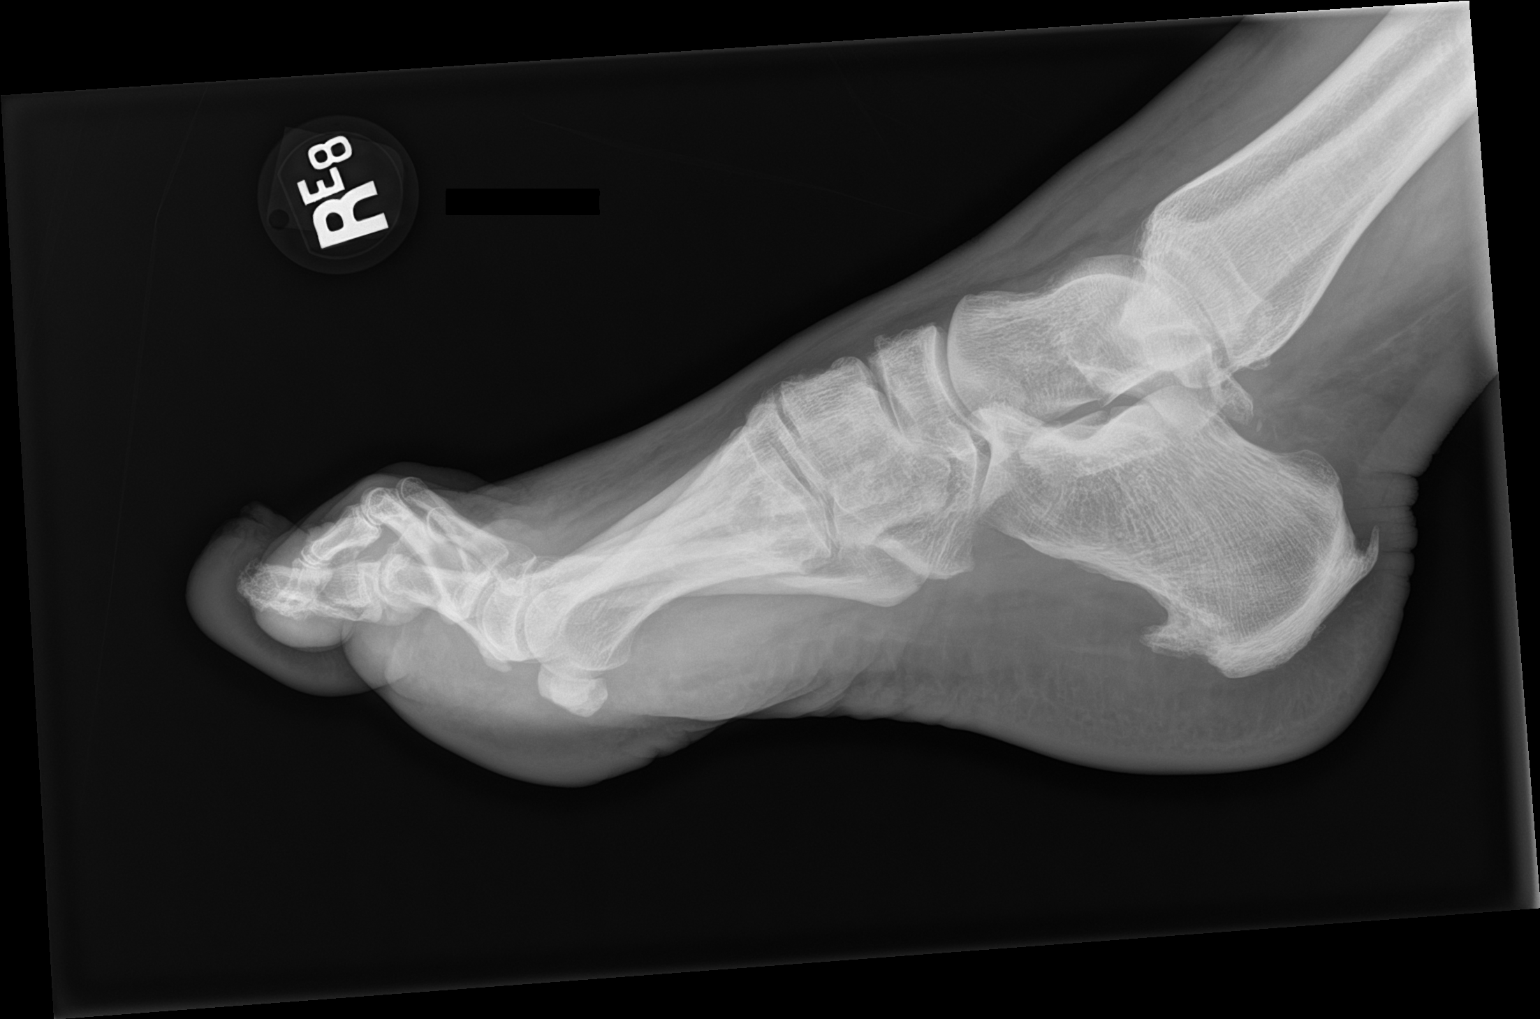

[3 of 3 positions shown; findings below may reference images not displayed]

FINDINGS: No acute fracture. No dislocation. No bony erosion or periosteal
elevation. Soft tissue swelling of the great toe. No soft tissue
gas.
IMPRESSION: No radiographic evidence of osteomyelitis of the right foot.

## 2022-05-14 DIAGNOSIS — E1165 Type 2 diabetes mellitus with hyperglycemia: Secondary | ICD-10-CM | POA: Diagnosis not present

## 2022-05-20 DIAGNOSIS — J441 Chronic obstructive pulmonary disease with (acute) exacerbation: Secondary | ICD-10-CM | POA: Diagnosis not present

## 2022-06-14 DIAGNOSIS — E1165 Type 2 diabetes mellitus with hyperglycemia: Secondary | ICD-10-CM | POA: Diagnosis not present

## 2022-06-20 DIAGNOSIS — J441 Chronic obstructive pulmonary disease with (acute) exacerbation: Secondary | ICD-10-CM | POA: Diagnosis not present

## 2022-07-01 ENCOUNTER — Ambulatory Visit (INDEPENDENT_AMBULATORY_CARE_PROVIDER_SITE_OTHER): Payer: 59

## 2022-07-01 ENCOUNTER — Ambulatory Visit (INDEPENDENT_AMBULATORY_CARE_PROVIDER_SITE_OTHER): Payer: 59 | Admitting: Podiatry

## 2022-07-01 DIAGNOSIS — L97512 Non-pressure chronic ulcer of other part of right foot with fat layer exposed: Secondary | ICD-10-CM

## 2022-07-01 DIAGNOSIS — E0843 Diabetes mellitus due to underlying condition with diabetic autonomic (poly)neuropathy: Secondary | ICD-10-CM | POA: Diagnosis not present

## 2022-07-01 DIAGNOSIS — M79674 Pain in right toe(s): Secondary | ICD-10-CM | POA: Diagnosis not present

## 2022-07-01 DIAGNOSIS — B351 Tinea unguium: Secondary | ICD-10-CM

## 2022-07-01 DIAGNOSIS — S91201A Unspecified open wound of right great toe with damage to nail, initial encounter: Secondary | ICD-10-CM | POA: Diagnosis not present

## 2022-07-01 MED ORDER — CEPHALEXIN 500 MG PO CAPS: 500.0000 mg | ORAL_CAPSULE | Freq: Three times a day (TID) | ORAL | 0 refills | Status: DC

## 2022-07-01 MED ORDER — MUPIROCIN 2 % EX OINT: 1.0000 | TOPICAL_OINTMENT | Freq: Every day | CUTANEOUS | 2 refills | Status: DC

## 2022-07-06 NOTE — Progress Notes (Signed)
  Subjective:  Patient ID: Nicole [name], female    DOB: [birth year],  MRN: [id number]  Chief Complaint  Patient presents with   Diabetes    DFC   Toe Injury    RIGHT HALLUX INJURY HAPPENED A WEEK AGO, PART OF THE NAIL CAME OFF, NO N, V, F, JUST BLEEDING NO PUSS.    57 y.o. female presents with the above complaint. History confirmed with patient.   Objective:  Physical Exam: She has diffuse polyneuropathy.  Weakly palpable DP and PT pulses.  Foot is warm and well-perfused.  Normal cap fill time.  Thickened elongated nails x 5, debridement of the hallux nails reveals a draining ulcer at the tip of the hallux.  No cellulitis.    Assessment:   1. Ulcer of great toe, right, with fat layer exposed (HCC)   2. Diabetes mellitus due to underlying condition with diabetic autonomic neuropathy, unspecified whether long term insulin use (HCC)   3. Traumatic loss of toenail of right great toe, initial encounter   4. Pain due to onychomycosis of toenail of right foot      Plan:  Patient was evaluated and treated and all questions answered.  Nails were debrided in length and thickness for reduction in pain and improvement in function.  She has benefit from this on a regular basis.  She has significant risk factors for ulceration and limb loss and so debridement at a podiatrist office is medically necessary.  Her right hallux shows a new ulceration on the distal hallux.  I recommended a surgical shoe.  Rx for Keflex sent to pharmacy as well as mupirocin.  Change daily.  Advised on signs and symptoms of worsening infection.  She has previous amputation of her left leg due to infection and blood sugar control and daily wound care will be critical to healing this.  Return in 3 weeks for wound care with me.  Return in about 3 weeks (around 07/22/2022) for wound care.

## 2022-07-08 DIAGNOSIS — H25813 Combined forms of age-related cataract, bilateral: Secondary | ICD-10-CM | POA: Diagnosis not present

## 2022-07-08 DIAGNOSIS — Z794 Long term (current) use of insulin: Secondary | ICD-10-CM | POA: Diagnosis not present

## 2022-07-08 DIAGNOSIS — E103293 Type 1 diabetes mellitus with mild nonproliferative diabetic retinopathy without macular edema, bilateral: Secondary | ICD-10-CM | POA: Diagnosis not present

## 2022-07-13 ENCOUNTER — Encounter: Payer: Self-pay | Admitting: Podiatry

## 2022-07-14 DIAGNOSIS — E1165 Type 2 diabetes mellitus with hyperglycemia: Secondary | ICD-10-CM | POA: Diagnosis not present

## 2022-07-20 DIAGNOSIS — J441 Chronic obstructive pulmonary disease with (acute) exacerbation: Secondary | ICD-10-CM | POA: Diagnosis not present

## 2022-07-22 ENCOUNTER — Ambulatory Visit (INDEPENDENT_AMBULATORY_CARE_PROVIDER_SITE_OTHER): Payer: 59 | Admitting: Podiatry

## 2022-07-22 DIAGNOSIS — L97512 Non-pressure chronic ulcer of other part of right foot with fat layer exposed: Secondary | ICD-10-CM | POA: Diagnosis not present

## 2022-07-23 NOTE — Progress Notes (Signed)
  Subjective:  Patient ID: Nicole [name], female    DOB: 03-30-1965,  MRN: [id number]  Chief Complaint  Patient presents with   Foot Ulcer    3 week follow up right great toe    57 y.o. female presents with the above complaint. History confirmed with patient.  She presents utilizing the surgical shoe, has had difficulty getting a Band-Aid to stay on the toe  Objective:  Physical Exam: She has diffuse polyneuropathy.  Weakly palpable DP and PT pulses.  Foot is warm and well-perfused.  Normal cap fill time.  Thickened elongated nails x 5, ulceration has improved to 0.7 x 0.8 x 0.2 cm.  Surrounding hyperkeratosis.  Granular wound bed.  No malodor or purulence    Assessment:   1. Ulcer of great toe, right, with fat layer exposed (HCC)       Plan:  Patient was evaluated and treated and all questions answered.  Ulcer right hallux -We discussed the etiology and factors that are a part of the wound healing process.  We also discussed the risk of infection both soft tissue and osteomyelitis from open ulceration.  Discussed the risk of limb loss if this happens or worsens. -Debridement as below. -Dressed with Iodosorb, DSD. -Continue home dressing changes daily with 4 x 4 gauze and mupirocin with tape or Coban -Continue off-loading with surgical shoe. -Vascular testing pulses are palpable -HgbA1c: Uncontrolled -Last antibiotics: Has completed Keflex do not see indication for new oral antibiotics at this point   Procedure: Excisional Debridement of Wound Rationale: Removal of non-viable soft tissue from the wound to promote healing.  Anesthesia: none Post-Debridement Wound Measurements: Noted above Type of Debridement: Sharp Excisional Tissue Removed: Non-viable soft tissue Depth of Debridement: subcutaneous tissue. Technique: Sharp excisional debridement to bleeding, viable wound base.  Dressing: Dry, sterile, compression dressing. Disposition: Patient tolerated procedure well.         Return in about 3 weeks (around 08/12/2022) for wound care.

## 2022-07-26 ENCOUNTER — Ambulatory Visit: Payer: 59 | Admitting: Podiatry

## 2022-07-27 ENCOUNTER — Ambulatory Visit: Payer: 59 | Admitting: Podiatry

## 2022-08-03 DIAGNOSIS — G4733 Obstructive sleep apnea (adult) (pediatric): Secondary | ICD-10-CM | POA: Diagnosis not present

## 2022-08-13 ENCOUNTER — Ambulatory Visit (INDEPENDENT_AMBULATORY_CARE_PROVIDER_SITE_OTHER): Payer: 59 | Admitting: Podiatry

## 2022-08-13 DIAGNOSIS — E0843 Diabetes mellitus due to underlying condition with diabetic autonomic (poly)neuropathy: Secondary | ICD-10-CM

## 2022-08-13 DIAGNOSIS — L97512 Non-pressure chronic ulcer of other part of right foot with fat layer exposed: Secondary | ICD-10-CM

## 2022-08-13 MED ORDER — DOXYCYCLINE HYCLATE 100 MG PO TABS: 100.0000 mg | ORAL_TABLET | Freq: Two times a day (BID) | ORAL | 0 refills | Status: AC

## 2022-08-13 NOTE — Progress Notes (Signed)
Subjective:  Patient ID: Nicole [name], female    DOB: 05/09/1965,  MRN: [id number]  Chief Complaint  Patient presents with   Wound Check    Wound check patient states she has been having some throbbing pain that goes away , states it has been associated with discharge     57 y.o. female presents for ulcer check to the right hallux.  She has noted some increasing drainage as well as possible malodor associated with the wound.  Not currently on antibiotics but has been in the past.  Past Medical History:  Diagnosis Date   CAP (community acquired pneumonia)    Streptococcus 01/2011   COPD (chronic obstructive pulmonary disease) (HCC)    Coronary atherosclerosis of native coronary artery    a. Diagnosed [place] 2006 - DES RCA, reports followup cath 2009 at [facility] b. reported 2 stents in [place] in 2020. c. 07/2021: cath showing patent stents along RCA and mid-LAD with scattered 20% stenosis but no obstructive disease.   Dyslipidemia    Essential hypertension, benign    Morbid obesity (HCC)    Pancreatitis    Type 2 diabetes mellitus (HCC)     Allergies  Allergen Reactions   Aspirin Anaphylaxis and Other (See Comments)    Throat closing    Bee Venom Anaphylaxis   Pineapple Anaphylaxis   Definity [Perflutren Lipid Microsphere]     Muscle Aches   E-Mycin [Erythromycin Base] Nausea And Vomiting    ROS: Negative except as per HPI above  Objective:  General: AAO x3, NAD  She has diffuse polyneuropathy.  Weakly palpable DP and PT pulses.  Foot is warm and well-perfused.  Normal cap fill time.  Thickened elongated nails x 5, ulceration has improved to  0.6 cm x 0.5 cm x 0.2 cm .  Surrounding hyperkeratosis.  Granular wound bed. Malodor noted    Radiographs: Deferred Assessment:   1. Ulcer of great toe, right, with fat layer exposed (HCC)   2. Diabetes mellitus due to underlying condition with diabetic autonomic neuropathy, unspecified whether long term insulin use (HCC)       Plan:  Patient was evaluated and treated and all questions answered.  Ulcer distal aspect of the right hallux -We discussed the etiology and factors that are a part of the wound healing process.  We also discussed the risk of infection both soft tissue and osteomyelitis from open ulceration.  Discussed the risk of limb loss if this happens or worsens. -Debridement as below. -Dressed with Betadine, DSD. -Continue home dressing changes daily with Betadine and dry gauze dressing -Continue off-loading with surgical shoe. -Vascular testing deferred -HgbA1c: Uncontrolled -Last antibiotics: E Rx for doxycycline 100 mg twice daily for 10 days due to concern for mild superficial skin and soft tissue infection at the site of ulceration -Imaging: Deferred at this  Procedure: Excisional Debridement of Wound Rationale: Removal of non-viable soft tissue from the wound to promote healing.  Anesthesia: none Post-Debridement Wound Measurements: 0.6 cm x 0.5 cm x 0.2 cm  Type of Debridement: Sharp Excisional Tissue Removed: Non-viable soft tissue Depth of Debridement: subcutaneous tissue. Technique: Sharp excisional debridement to bleeding, viable wound base.  Dressing: Dry, sterile, compression dressing. Disposition: Patient tolerated procedure well.       Return in about 3 weeks (around 09/03/2022) for ulcer check R hallux.          Corinna Gab, DPM [facility] / [facility]

## 2022-08-14 DIAGNOSIS — E1165 Type 2 diabetes mellitus with hyperglycemia: Secondary | ICD-10-CM | POA: Diagnosis not present

## 2022-08-17 ENCOUNTER — Ambulatory Visit (INDEPENDENT_AMBULATORY_CARE_PROVIDER_SITE_OTHER): Payer: 59 | Admitting: Nurse Practitioner

## 2022-08-17 ENCOUNTER — Encounter: Payer: Self-pay | Admitting: Nurse Practitioner

## 2022-08-17 VITALS — BP 118/74 | HR 71 | Ht 63.0 in | Wt 281.4 lb

## 2022-08-17 DIAGNOSIS — I10 Essential (primary) hypertension: Secondary | ICD-10-CM | POA: Diagnosis not present

## 2022-08-17 DIAGNOSIS — E1165 Type 2 diabetes mellitus with hyperglycemia: Secondary | ICD-10-CM

## 2022-08-17 DIAGNOSIS — Z794 Long term (current) use of insulin: Secondary | ICD-10-CM

## 2022-08-17 DIAGNOSIS — Z7985 Long-term (current) use of injectable non-insulin antidiabetic drugs: Secondary | ICD-10-CM | POA: Diagnosis not present

## 2022-08-17 DIAGNOSIS — Z7984 Long term (current) use of oral hypoglycemic drugs: Secondary | ICD-10-CM

## 2022-08-17 DIAGNOSIS — E782 Mixed hyperlipidemia: Secondary | ICD-10-CM | POA: Diagnosis not present

## 2022-08-17 LAB — POCT GLYCOSYLATED HEMOGLOBIN (HGB A1C): Hemoglobin A1C: 8.1 % — AB (ref 4.0–5.6)

## 2022-08-17 NOTE — Progress Notes (Signed)
Endocrinology Follow Up Note       08/17/2022, 10:42 AM   Subjective:    Patient ID: Nicole [name], female    DOB: [birth year].  Nicole [name] is being seen in follow up after being seen in consultation for management of currently uncontrolled symptomatic diabetes requested by  Benita Stabile, MD.   Past Medical History:  Diagnosis Date   CAP (community acquired pneumonia)    Streptococcus 01/2011   COPD (chronic obstructive pulmonary disease) (HCC)    Coronary atherosclerosis of native coronary artery    a. Diagnosed [place] 2006 - DES RCA, reports followup cath 2009 at [facility] b. reported 2 stents in [place] in 2020. c. 07/2021: cath showing patent stents along RCA and mid-LAD with scattered 20% stenosis but no obstructive disease.   Dyslipidemia    Essential hypertension, benign    Morbid obesity (HCC)    Pancreatitis    Type 2 diabetes mellitus (HCC)     Past Surgical History:  Procedure Laterality Date   ABDOMINAL HERNIA REPAIR     ABDOMINAL HYSTERECTOMY     AMPUTATION Left 01/25/2020   Procedure: LEFT BELOW KNEE AMPUTATION;  Surgeon: [name], MD;  Location: [facility];  Service: Orthopedics;  Laterality: Left;   CORONARY ANGIOPLASTY WITH STENT PLACEMENT  2006   KNEE SURGERY     LEFT HEART CATH AND CORONARY ANGIOGRAPHY N/A 08/14/2021   Procedure: LEFT HEART CATH AND CORONARY ANGIOGRAPHY;  Surgeon: Lennette Bihari, MD;  Location: [facility] INVASIVE CV LAB;  Service: Cardiovascular;  Laterality: N/A;   NOSE SURGERY     Pilonidal cystectomy     TONSILLECTOMY      Social History   Socioeconomic History   Marital status: Divorced    Spouse name: Not on file   Number of children: 3   Years of education: Not on file   Highest education level: Not on file  Occupational History   Occupation: Home health aide    Employer: [employer]  Tobacco Use   Smoking status: Former    Packs/day:  0.50    Years: 20.00    Additional pack years: 0.00    Total pack years: 10.00    Types: Cigarettes    Quit date: 01/22/2020    Years since quitting: 2.5   Smokeless tobacco: Never  Vaping Use   Vaping Use: Never used  Substance and Sexual Activity   Alcohol use: No   Drug use: No   Sexual activity: Yes    Birth control/protection: Surgical  Other Topics Concern   Not on file  Social History Narrative   Not on file   Social Determinants of Health   Financial Resource Strain: Not on file  Food Insecurity: No Food Insecurity (12/11/2021)   Hunger Vital Sign    Worried About Running Out of Food in the Last Year: Never true    Ran Out of Food in the Last Year: Never true  Transportation Needs: No Transportation Needs (12/11/2021)   PRAPARE - Administrator, Civil Service (Medical): No    Lack of Transportation (Non-Medical): No  Physical Activity: Not on file  Stress: Not on  file  Social Connections: Not on file    Family History  Problem Relation Age of Onset   Diabetes Mother    Heart failure Mother    Hypertension Mother    Kidney failure Mother    Ovarian cancer Mother    Asthma Son     Outpatient Encounter Medications as of 08/17/2022  Medication Sig   ACCU-CHEK GUIDE test strip USE 1 STRIP TO CHECK GLUCOSE 4 TIMES DAILY   acetaminophen (TYLENOL) 500 MG tablet Take 1 tablet (500 mg total) by mouth every 6 (six) hours as needed for moderate pain.   albuterol (PROVENTIL) (2.5 MG/3ML) 0.083% nebulizer solution Take 3 mLs (2.5 mg total) by nebulization every 6 (six) hours as needed for wheezing.   albuterol (VENTOLIN HFA) 108 (90 Base) MCG/ACT inhaler Inhale 2 puffs into the lungs every 6 (six) hours as needed for wheezing. Shortness of breath   blood glucose meter kit and supplies Dispense based on patient and insurance preference. Use four times daily as directed. (FOR ICD-10 E10.9, E11.9).   buPROPion ER (WELLBUTRIN SR) 100 MG 12 hr tablet Take 100 mg by  mouth daily.   clopidogrel (PLAVIX) 75 MG tablet Take 1 tablet (75 mg total) by mouth daily.   dapagliflozin propanediol (FARXIGA) 10 MG TABS tablet Take 1 tablet (10 mg total) by mouth daily.   doxycycline (VIBRA-TABS) 100 MG tablet Take 1 tablet (100 mg total) by mouth 2 (two) times daily for 10 days.   EASY TOUCH INSULIN SYRINGE 31G X 5/16" 1 ML MISC    EPINEPHrine 0.3 mg/0.3 mL IJ SOAJ injection Inject 0.3 mg into the muscle as needed for anaphylaxis.   Insulin Pen Needle (B-D ULTRAFINE III SHORT PEN) 31G X 8 MM MISC USE AS DIRECTED   insulin regular human CONCENTRATED (HUMULIN R U-500 KWIKPEN) 500 UNIT/ML KwikPen Inject 80 Units into the skin 3 (three) times daily with meals.   KERENDIA 10 MG TABS Take 1 tablet by mouth daily.   loperamide (IMODIUM) 2 MG capsule Take 1 capsule (2 mg total) by mouth every 6 (six) hours as needed for diarrhea or loose stools.   metoprolol tartrate (LOPRESSOR) 25 MG tablet Take 1 tablet (25 mg total) by mouth 2 (two) times daily.   Multiple Vitamins-Calcium (ONE-A-DAY WOMENS FORMULA) TABS Take 1 tablet by mouth daily.   mupirocin ointment (BACTROBAN) 2 % Apply 1 Application topically daily.   nitroGLYCERIN (NITROSTAT) 0.4 MG SL tablet Take one tablet every 5 minutes as needed for chest pains up to 3 tablets. (Patient taking differently: Place 0.4 mg under the tongue every 5 (five) minutes as needed for chest pain (every 5 minutes as needed for chest pains up to 3 tablets.).)   Nystatin (GERHARDT'S BUTT CREAM) CREA Apply 1 Application topically 2 (two) times daily.   nystatin powder Apply 1 Application topically daily as needed (irritation).   pantoprazole (PROTONIX) 40 MG tablet Take 1 tablet (40 mg total) by mouth daily. (Patient taking differently: Take 40 mg by mouth at bedtime.)   potassium chloride SA (KLOR-CON M) 20 MEQ tablet Take 1 tablet (20 mEq total) by mouth daily.   rosuvastatin (CRESTOR) 40 MG tablet Take 40 mg by mouth at bedtime.   Semaglutide, 2  MG/DOSE, 8 MG/3ML SOPN Inject 2 mg as directed once a week.   torsemide (DEMADEX) 20 MG tablet Take 2 tablets (40 mg total) by mouth daily.   Vitamin D, Ergocalciferol, (DRISDOL) 1.25 MG (50000 UNIT) CAPS capsule Take 50,000 Units by  mouth every Monday.   VRAYLAR 3 MG capsule Take 3 mg by mouth daily.   cephALEXin (KEFLEX) 500 MG capsule Take 1 capsule (500 mg total) by mouth 3 (three) times daily. (Patient not taking: Reported on 08/17/2022)   No facility-administered encounter medications on file as of 08/17/2022.    ALLERGIES: Allergies  Allergen Reactions   Aspirin Anaphylaxis and Other (See Comments)    Throat closing    Bee Venom Anaphylaxis   Pineapple Anaphylaxis   Definity [Perflutren Lipid Microsphere]     Muscle Aches   E-Mycin [Erythromycin Base] Nausea And Vomiting    VACCINATION STATUS: Immunization History  Administered Date(s) Administered   Influenza,inj,Quad PF,6+ Mos 12/25/2019, 12/23/2020   Moderna Sars-Covid-2 Vaccination 06/25/2019, 07/25/2019   Pneumococcal Polysaccharide-23 04/11/2012, 12/25/2019    Diabetes She presents for her follow-up diabetic visit. She has type 2 diabetes mellitus. Onset time: diagnosed at approx age of 41. Her disease course has been improving. There are no hypoglycemic associated symptoms. Associated symptoms include blurred vision, fatigue, foot paresthesias, polydipsia, polyuria and weight loss. There are no hypoglycemic complications. Symptoms are improving. Diabetic complications include heart disease, peripheral neuropathy and PVD. (Hx Left BKA, MI) Risk factors for coronary artery disease include diabetes mellitus, dyslipidemia, family history, obesity, hypertension and sedentary lifestyle. Current diabetic treatment includes intensive insulin program and oral agent (monotherapy) (and Ozempic). She is compliant with treatment most of the time. Her weight is decreasing steadily. She is following a generally unhealthy diet. When asked  about meal planning, she reported none. She has not had a previous visit with a dietitian. She rarely participates in exercise. Her home blood glucose trend is decreasing steadily. Her breakfast blood glucose range is generally >200 mg/dl. Her lunch blood glucose range is generally >200 mg/dl. Her dinner blood glucose range is generally >200 mg/dl. Her bedtime blood glucose range is generally >200 mg/dl. (She presents today, accompanied by her family, with her CGM showing improved but still above target glycemic profile overall.  Her POCT A1c today is 8.1%, improving from last visit of 10.9%.  Analysis of her CGM shows TIR 22%, TAR 78% (49% in very high range), TBR <1% with a GMI of 9.1%.  she notes she has missed her insulin injection from time to time.  Says her appetite is not that great due to hotter temperatures.) An ACE inhibitor/angiotensin II receptor blocker is being taken. She sees a podiatrist.Eye exam is current.  Hypertension This is a chronic problem. The current episode started more than 1 year ago. The problem has been gradually improving since onset. The problem is controlled. Associated symptoms include blurred vision. There are no associated agents to hypertension. Risk factors for coronary artery disease include diabetes mellitus, dyslipidemia, family history, obesity and sedentary lifestyle. Past treatments include ACE inhibitors, beta blockers and diuretics. The current treatment provides mild improvement. Compliance problems include diet and exercise.  Hypertensive end-organ damage includes CAD/MI, heart failure and PVD.  Hyperlipidemia This is a chronic problem. The current episode started more than 1 year ago. The problem is uncontrolled. Recent lipid tests were reviewed and are variable. Exacerbating diseases include diabetes and obesity. Factors aggravating her hyperlipidemia include beta blockers. Current antihyperlipidemic treatment includes statins. The current treatment provides  mild improvement of lipids. Compliance problems include adherence to diet and adherence to exercise.  Risk factors for coronary artery disease include diabetes mellitus, dyslipidemia, family history, obesity, hypertension and a sedentary lifestyle.     Review of systems  Constitutional: + decreasing  body weight, current Body mass index is 49.85 kg/m., + fatigue, no subjective hyperthermia, no subjective hypothermia Eyes: + blurry vision, no xerophthalmia ENT: no sore throat, no nodules palpated in throat, no dysphagia/odynophagia, no hoarseness Cardiovascular: no chest pain, no shortness of breath, no palpitations, + leg swelling Respiratory: no cough, + shortness of breath- hx COPD on chronic O2 Gastrointestinal: no nausea/vomiting/diarrhea Musculoskeletal: no muscle/joint aches, WC bound due to hx of L BKA- has prosthesis Skin: no rashes, no hyperemia, does note some yeast in abdominal folds/groin Neurological: no tremors, no numbness, no tingling, no dizziness Psychiatric: no depression, no anxiety  Objective:     BP 118/74 (BP Location: Left Arm, Patient Position: Sitting, Cuff Size: Large)   Pulse 71   Ht 5\' 3"  (1.6 m)   Wt 281 lb 6.4 oz (127.6 kg)   BMI 49.85 kg/m   Wt Readings from Last 3 Encounters:  08/17/22 281 lb 6.4 oz (127.6 kg)  04/05/22 290 lb (131.5 kg)  01/11/22 288 lb (130.6 kg)     BP Readings from Last 3 Encounters:  08/17/22 118/74  05/06/22 122/74  04/05/22 134/66     Physical Exam- Limited  Constitutional:  Body mass index is 49.85 kg/m. , not in acute distress, normal state of mind Eyes:  EOMI, no exophthalmos Musculoskeletal: WC bound- hx L BKA with prosthesis Skin:  no rashes, no hyperemia Neurological: no tremor with outstretched hands   Diabetic Foot Exam - Simple   No data filed    CMP ( most recent) CMP     Component Value Date/Time   NA 136 01/11/2022 1441   K 3.8 01/11/2022 1441   CL 98 01/11/2022 1441   CO2 26 01/11/2022  1441   GLUCOSE 423 (H) 01/11/2022 1441   BUN 34 (H) 01/11/2022 1441   CREATININE 1.40 (H) 01/11/2022 1441   CREATININE 0.86 07/30/2020 1558   CALCIUM 9.2 01/11/2022 1441   PROT 6.9 12/08/2021 0913   ALBUMIN 3.2 (L) 12/08/2021 0913   AST 26 12/08/2021 0913   ALT 19 12/08/2021 0913   ALKPHOS 79 12/08/2021 0913   BILITOT 0.4 12/08/2021 0913   GFRNONAA 44 (L) 01/11/2022 1441   GFRNONAA 76 07/30/2020 1558   GFRAA 88 07/30/2020 1558     Diabetic Labs (most recent): Lab Results  Component Value Date   HGBA1C 8.1 (A) 08/17/2022   HGBA1C 9.5 (H) 12/08/2021   HGBA1C 14.3 09/29/2021     Lipid Panel ( most recent) Lipid Panel     Component Value Date/Time   CHOL 109 12/15/2021 0551   TRIG 304 (H) 12/15/2021 0551   HDL 25 (L) 12/15/2021 0551   CHOLHDL 4.4 12/15/2021 0551   VLDL 61 (H) 12/15/2021 0551   LDLCALC 23 12/15/2021 0551   LDLCALC 65 12/25/2019 1359      Lab Results  Component Value Date   TSH 2.06 12/25/2019   TSH 2.800 04/11/2012   TSH 4.814 (H) 11/10/2011   TSH 2.672 02/19/2011   FREET4 1.2 12/25/2019           Assessment & Plan:   1) Type 2 diabetes mellitus with hyperglycemia, with long-term current use of insulin (HCC)  She presents today, accompanied by her family, with her CGM showing improved but still above target glycemic profile overall.  Her POCT A1c today is 8.1%, improving from last visit of 10.9%.  Analysis of her CGM shows TIR 22%, TAR 78% (49% in very high range), TBR <1% with a GMI of 9.1%.  she notes she has missed her insulin injection from time to time.  Says her appetite is not that great due to hotter temperatures.  - Nicole [name] has currently uncontrolled symptomatic type 2 DM since 57 years of age.   -Recent labs reviewed.  - I had a long discussion with her about the progressive nature of diabetes and the pathology behind its complications. -her diabetes is complicated by PVD- L BKA, MI, CAD, CHF and she remains at a high risk  for more acute and chronic complications which include CAD, CVA, CKD, retinopathy, and neuropathy. These are all discussed in detail with her.  - Nutritional counseling repeated at each appointment due to patients tendency to fall back in to old habits.  - The patient admits there is a room for improvement in their diet and drink choices. -  Suggestion is made for the patient to avoid simple carbohydrates from their diet including Cakes, Sweet Desserts / Pastries, Ice Cream, Soda (diet and regular), Sweet Tea, Candies, Chips, Cookies, Sweet Pastries, Store Bought Juices, Alcohol in Excess of 1-2 drinks a day, Artificial Sweeteners, Coffee Creamer, and "Sugar-free" Products. This will help patient to have stable blood glucose profile and potentially avoid unintended weight gain.   - I encouraged the patient to switch to unprocessed or minimally processed complex starch and increased protein intake (animal or plant source), fruits, and vegetables.   - Patient is advised to stick to a routine mealtimes to eat 3 meals a day and avoid unnecessary snacks (to snack only to correct hypoglycemia).  - she will be scheduled with Norm Salt, RDN, CDE for diabetes education.  - I have approached her with the following individualized plan to manage her diabetes and patient agrees:   -She is advised to continue U500 80 units TID with meals if glucose is above 90 and she is eating, continue Farxiga 10 mg po daily, and Ozempic 2 mg SQ weekly.  I advised her to use OTC Gold bond powder to help with yeast in abdominal/groin folds and if it does not improve we discussed we may need to stop the [place] in the future.  -she is encouraged to continue monitoring glucose 4 times daily, before meals and before bed (using her CGM), and to call the clinic if she has readings less than 70 or above 300 for 3 tests in a row.    - she is warned not to take insulin without proper monitoring per orders. - Adjustment  parameters are given to her for hypo and hyperglycemia in writing.  - Specific targets for  A1c; LDL, HDL, and Triglycerides were discussed with the patient.  2) Blood Pressure /Hypertension:  her blood pressure is controlled to target.   she is advised to continue her current medications including Lasix 80 mg po daily, Lisinopril 2.5 mg p.o. daily with breakfast, and Metoprolol 50 mg po twice daily.  3) Lipids/Hyperlipidemia:    Review of her recent lipid panel from 03/04/22 showed uncontrolled LDL at 1781 and elevated triglycerides of 544 (worsening).  she is advised to continue Crestor 40 mg daily at bedtime.  Side effects and precautions discussed with her.  She is also advised to avoid fried foods and butter.  4)  Weight/Diet:  her Body mass index is 49.85 kg/m.  -  clearly complicating her diabetes care.   she is a candidate for weight loss. I discussed with her the fact that loss of 5 - 10% of her  current body weight  will have the most impact on her diabetes management.  Exercise, and detailed carbohydrates information provided  -  detailed on discharge instructions.  5) Chronic Care/Health Maintenance: -she is on ACEI/ARB and Statin medications and is encouraged to initiate and continue to follow up with Ophthalmology, Dentist, Podiatrist at least yearly or according to recommendations, and advised to stay away from smoking. I have recommended yearly flu vaccine and pneumonia vaccine at least every 5 years; moderate intensity exercise for up to 150 minutes weekly; and sleep for at least 7 hours a day.  - she is advised to maintain close follow up with Benita Stabile, MD for primary care needs, as well as her other providers for optimal and coordinated care.     I spent  41  minutes in the care of the patient today including review of labs from CMP, Lipids, Thyroid Function, Hematology (current and previous including abstractions from other facilities); face-to-face time discussing  her  blood glucose readings/logs, discussing hypoglycemia and hyperglycemia episodes and symptoms, medications doses, her options of short and long term treatment based on the latest standards of care / guidelines;  discussion about incorporating lifestyle medicine;  and documenting the encounter. Risk reduction counseling performed per USPSTF guidelines to reduce obesity and cardiovascular risk factors.     Please refer to Patient Instructions for Blood Glucose Monitoring and Insulin/Medications Dosing Guide"  in media tab for additional information. Please  also refer to " Patient Self Inventory" in the Media  tab for reviewed elements of pertinent patient history.  Nicole [name] participated in the discussions, expressed understanding, and voiced agreement with the above plans.  All questions were answered to her satisfaction. she is encouraged to contact clinic should she have any questions or concerns prior to her return visit.     Follow up plan: - Return in about 3 months (around 11/17/2022) for Diabetes F/U with A1c in office, No previsit labs, Bring meter and logs.   Ronny Bacon, [facility] [address] [place], [place] [address] Phone: [phone] Fax: [phone]  08/17/2022, 10:42 AM

## 2022-08-20 DIAGNOSIS — J441 Chronic obstructive pulmonary disease with (acute) exacerbation: Secondary | ICD-10-CM | POA: Diagnosis not present

## 2022-09-02 ENCOUNTER — Ambulatory Visit (INDEPENDENT_AMBULATORY_CARE_PROVIDER_SITE_OTHER): Payer: 59 | Admitting: Podiatry

## 2022-09-02 DIAGNOSIS — L97512 Non-pressure chronic ulcer of other part of right foot with fat layer exposed: Secondary | ICD-10-CM | POA: Diagnosis not present

## 2022-09-02 DIAGNOSIS — Z89512 Acquired absence of left leg below knee: Secondary | ICD-10-CM

## 2022-09-02 DIAGNOSIS — E0843 Diabetes mellitus due to underlying condition with diabetic autonomic (poly)neuropathy: Secondary | ICD-10-CM

## 2022-09-02 MED ORDER — DOXYCYCLINE HYCLATE 100 MG PO TABS: 100.0000 mg | ORAL_TABLET | Freq: Two times a day (BID) | ORAL | 0 refills | Status: AC

## 2022-09-02 NOTE — Progress Notes (Signed)
Subjective:  Patient ID: Nicole [name], female    DOB: August 11, 1965,  MRN: [id number]  Chief Complaint  Patient presents with   Wound Check    Wound check to right hallux. Patient is changing dressing daily and using iodine. Patient continues to have drainage. Patient administered last dose of antibiotics on Sunday.     57  y.o. female presents for ulcer check to the right hallux.  She has noted persistent drainage from the right hallux.  A course of antibiotics previously administered last dose of antibiotic on Sunday.  She is changing the dressing to the toe daily with Betadine.  Also notes some swelling and redness of the toe which is chronic in nature. Past Medical History:  Diagnosis Date   CAP (community acquired pneumonia)    Streptococcus 01/2011   COPD (chronic obstructive pulmonary disease) (HCC)    Coronary atherosclerosis of native coronary artery    a. Diagnosed [place] 2006 - DES RCA, reports followup cath 2009 at [facility] b. reported 2 stents in [place] in 2020. c. 07/2021: cath showing patent stents along RCA and mid-LAD with scattered 20% stenosis but no obstructive disease.   Dyslipidemia    Essential hypertension, benign    Morbid obesity (HCC)    Pancreatitis    Type 2 diabetes mellitus (HCC)     Allergies  Allergen Reactions   Aspirin Anaphylaxis and Other (See Comments)    Throat closing    Bee Venom Anaphylaxis   Pineapple Anaphylaxis   Definity [Perflutren Lipid Microsphere]     Muscle Aches   E-Mycin [Erythromycin Base] Nausea And Vomiting    ROS: Negative except as per HPI above  Objective:  General: AAO x3, NAD  She has diffuse polyneuropathy.  Weakly palpable DP and nonpalpable PT pulses.  Foot is warm and well-perfused.  Normal cap fill time.  Thickened elongated nails x 5, ulceration has improved to  0.3 cm x 0.2 cm x 0.2 cm .  Surrounding hyperkeratosis.  Granular wound bed. Decreased malodor Patient has amputation of the lower extremity on the  left side.   Radiographs: Deferred Assessment:   1. Ulcer of great toe, right, with fat layer exposed (HCC)   2. Diabetes mellitus due to underlying condition with diabetic autonomic neuropathy, unspecified whether long term insulin use (HCC)       Plan:  Patient was evaluated and treated and all questions answered.  Ulcer distal aspect of the right hallux to the level of subcutaneous fat tissue -Overall the wound appears stable or improved from prior postdebridement.  However the patient still some drainage with some redness and swelling. -Ordered MRI to assess for possible osteomyelitis in the right hallux though I do not have high concern -ESR and CRP ordered -ABI PVR testing of the right lower extremity was ordered, possible vascular referral pending results -We discussed the etiology and factors that are a part of the wound healing process.  We also discussed the risk of infection both soft tissue and osteomyelitis from open ulceration.  Discussed the risk of limb loss if this happens or worsens. -Debridement as below. -Dressed with Betadine, DSD. -Continue home dressing changes daily with Betadine and dry gauze dressing -Continue off-loading with surgical shoe. -Vascular testing as a level ordered -HgbA1c: Uncontrolled -Last antibiotics: E Rx for doxycycline 100 mg twice daily for 10 days due to concern for mild superficial skin and soft tissue infection at the site of ulceration -Imaging: MRI of the right foot without contrast ordered to assess  for possible osteomyelitis of the distal phalanx of the right hallux  Procedure: Excisional Debridement of Wound Rationale: Removal of non-viable soft tissue from the wound to promote healing.  Anesthesia: none Post-Debridement Wound Measurements: 0.3 cm x 0.2 cm x 0.2 cm  Type of Debridement: Sharp Excisional Tissue Removed: Non-viable soft tissue Depth of Debridement: subcutaneous tissue. Technique: Sharp excisional debridement to  bleeding, viable wound base.  Dressing: Dry, sterile, compression dressing. Disposition: Patient tolerated procedure well.       Return in about 3 weeks (around 09/23/2022) for Ulcer check R hallux.          Corinna Gab, DPM [facility] / [facility]

## 2022-09-03 DIAGNOSIS — I10 Essential (primary) hypertension: Secondary | ICD-10-CM | POA: Diagnosis not present

## 2022-09-03 DIAGNOSIS — E785 Hyperlipidemia, unspecified: Secondary | ICD-10-CM | POA: Diagnosis not present

## 2022-09-03 DIAGNOSIS — E119 Type 2 diabetes mellitus without complications: Secondary | ICD-10-CM | POA: Diagnosis not present

## 2022-09-03 DIAGNOSIS — E559 Vitamin D deficiency, unspecified: Secondary | ICD-10-CM | POA: Diagnosis not present

## 2022-09-06 DIAGNOSIS — I10 Essential (primary) hypertension: Secondary | ICD-10-CM | POA: Diagnosis not present

## 2022-09-06 DIAGNOSIS — L97512 Non-pressure chronic ulcer of other part of right foot with fat layer exposed: Secondary | ICD-10-CM | POA: Diagnosis not present

## 2022-09-06 DIAGNOSIS — H25811 Combined forms of age-related cataract, right eye: Secondary | ICD-10-CM | POA: Diagnosis not present

## 2022-09-07 ENCOUNTER — Other Ambulatory Visit: Payer: Self-pay | Admitting: Internal Medicine

## 2022-09-07 ENCOUNTER — Other Ambulatory Visit (INDEPENDENT_AMBULATORY_CARE_PROVIDER_SITE_OTHER): Payer: 59 | Admitting: Podiatry

## 2022-09-07 ENCOUNTER — Encounter: Payer: Self-pay | Admitting: Podiatry

## 2022-09-07 DIAGNOSIS — L97512 Non-pressure chronic ulcer of other part of right foot with fat layer exposed: Secondary | ICD-10-CM

## 2022-09-07 LAB — SEDIMENTATION RATE: Sed Rate: 9 mm/hr (ref 0–40)

## 2022-09-07 LAB — C-REACTIVE PROTEIN: CRP: 3 mg/L (ref 0–10)

## 2022-09-07 NOTE — Progress Notes (Signed)
New order for MRI at [facility] placed

## 2022-09-09 ENCOUNTER — Telehealth: Payer: Self-pay | Admitting: Internal Medicine

## 2022-09-09 ENCOUNTER — Ambulatory Visit (HOSPITAL_COMMUNITY)
Admission: RE | Admit: 2022-09-09 | Discharge: 2022-09-09 | Disposition: A | Discharge status: Home / Self Care | Payer: 59 | Source: Ambulatory Visit | Attending: Podiatry | Admitting: Podiatry

## 2022-09-09 DIAGNOSIS — L97512 Non-pressure chronic ulcer of other part of right foot with fat layer exposed: Secondary | ICD-10-CM | POA: Diagnosis not present

## 2022-09-09 DIAGNOSIS — I25119 Atherosclerotic heart disease of native coronary artery with unspecified angina pectoris: Secondary | ICD-10-CM | POA: Diagnosis not present

## 2022-09-09 DIAGNOSIS — M6530 Trigger finger, unspecified finger: Secondary | ICD-10-CM | POA: Insufficient documentation

## 2022-09-09 DIAGNOSIS — E559 Vitamin D deficiency, unspecified: Secondary | ICD-10-CM | POA: Diagnosis not present

## 2022-09-09 DIAGNOSIS — G250 Essential tremor: Secondary | ICD-10-CM | POA: Insufficient documentation

## 2022-09-09 DIAGNOSIS — E11621 Type 2 diabetes mellitus with foot ulcer: Secondary | ICD-10-CM | POA: Diagnosis not present

## 2022-09-09 DIAGNOSIS — Z0001 Encounter for general adult medical examination with abnormal findings: Secondary | ICD-10-CM | POA: Diagnosis not present

## 2022-09-09 DIAGNOSIS — J449 Chronic obstructive pulmonary disease, unspecified: Secondary | ICD-10-CM | POA: Diagnosis not present

## 2022-09-09 DIAGNOSIS — E1165 Type 2 diabetes mellitus with hyperglycemia: Secondary | ICD-10-CM | POA: Diagnosis not present

## 2022-09-09 DIAGNOSIS — H269 Unspecified cataract: Secondary | ICD-10-CM | POA: Insufficient documentation

## 2022-09-09 DIAGNOSIS — L97509 Non-pressure chronic ulcer of other part of unspecified foot with unspecified severity: Secondary | ICD-10-CM | POA: Diagnosis not present

## 2022-09-09 DIAGNOSIS — E785 Hyperlipidemia, unspecified: Secondary | ICD-10-CM

## 2022-09-09 DIAGNOSIS — I10 Essential (primary) hypertension: Secondary | ICD-10-CM

## 2022-09-09 DIAGNOSIS — E1136 Type 2 diabetes mellitus with diabetic cataract: Secondary | ICD-10-CM | POA: Diagnosis not present

## 2022-09-09 DIAGNOSIS — E1169 Type 2 diabetes mellitus with other specified complication: Secondary | ICD-10-CM | POA: Diagnosis not present

## 2022-09-09 LAB — VAS US ABI WITH/WO TBI: Right ABI: 1.01

## 2022-09-09 MED ORDER — CLOPIDOGREL BISULFATE 75 MG PO TABS
75.0000 mg | ORAL_TABLET | Freq: Every day | ORAL | 3 refills | Status: DC
Start: 2022-09-09 — End: 2023-10-21

## 2022-09-09 NOTE — Telephone Encounter (Signed)
Seen by her PCP today.. was told to check with cardiologist about putting her back on clopidogrel (PLAVIX) 75 MG tablet .  [facility] [place], [place]

## 2022-09-09 NOTE — Telephone Encounter (Signed)
Medication sent to [facility] per provider.

## 2022-09-09 NOTE — Telephone Encounter (Signed)
Patient was on plavix when I saw her   Should be on plavix

## 2022-09-13 DIAGNOSIS — E1165 Type 2 diabetes mellitus with hyperglycemia: Secondary | ICD-10-CM | POA: Diagnosis not present

## 2022-09-15 DIAGNOSIS — H25811 Combined forms of age-related cataract, right eye: Secondary | ICD-10-CM | POA: Diagnosis not present

## 2022-09-15 DIAGNOSIS — Z01818 Encounter for other preprocedural examination: Secondary | ICD-10-CM | POA: Diagnosis not present

## 2022-09-17 ENCOUNTER — Other Ambulatory Visit: Payer: Self-pay

## 2022-09-17 MED ORDER — HUMULIN R U-500 KWIKPEN 500 UNIT/ML ~~LOC~~ SOPN: 80.0000 [IU] | PEN_INJECTOR | Freq: Three times a day (TID) | SUBCUTANEOUS | 0 refills | Status: DC

## 2022-09-18 ENCOUNTER — Other Ambulatory Visit: Payer: Self-pay | Admitting: Podiatry

## 2022-09-19 DIAGNOSIS — J441 Chronic obstructive pulmonary disease with (acute) exacerbation: Secondary | ICD-10-CM | POA: Diagnosis not present

## 2022-09-21 DIAGNOSIS — M65332 Trigger finger, left middle finger: Secondary | ICD-10-CM | POA: Diagnosis not present

## 2022-09-23 ENCOUNTER — Ambulatory Visit (INDEPENDENT_AMBULATORY_CARE_PROVIDER_SITE_OTHER): Payer: 59 | Admitting: Podiatry

## 2022-09-23 DIAGNOSIS — L97512 Non-pressure chronic ulcer of other part of right foot with fat layer exposed: Secondary | ICD-10-CM

## 2022-09-23 DIAGNOSIS — E0843 Diabetes mellitus due to underlying condition with diabetic autonomic (poly)neuropathy: Secondary | ICD-10-CM

## 2022-09-23 DIAGNOSIS — Z89512 Acquired absence of left leg below knee: Secondary | ICD-10-CM

## 2022-09-23 NOTE — Progress Notes (Signed)
Subjective:  Patient ID: Nicole [name], female    DOB: 1965-07-25,  MRN: [id number]  Chief Complaint  Patient presents with   Wound Check    Ulcer check right hallux    57 y.o. female presents for ulcer check to the right hallux.  She has noted significant improvement since last visit.  Previously has been on doxycycline 100 mg twice daily for 10 days.  She is changing the dressing to the toe daily with Betadine.  She states that the toe is less red and swollen than it was previously.  Had the vascular testing done.  Did not get the MRI done as she was unable to get it done at [facility] imaging. Past Medical History:  Diagnosis Date   CAP (community acquired pneumonia)    Streptococcus 01/2011   COPD (chronic obstructive pulmonary disease) (HCC)    Coronary atherosclerosis of native coronary artery    a. Diagnosed [place] 2006 - DES RCA, reports followup cath 2009 at [facility] b. reported 2 stents in [place] in 2020. c. 07/2021: cath showing patent stents along RCA and mid-LAD with scattered 20% stenosis but no obstructive disease.   Dyslipidemia    Essential hypertension, benign    Morbid obesity (HCC)    Pancreatitis    Type 2 diabetes mellitus (HCC)     Allergies  Allergen Reactions   Aspirin Anaphylaxis and Other (See Comments)    Throat closing    Bee Venom Anaphylaxis   Pineapple Anaphylaxis   Definity [Perflutren Lipid Microsphere]     Muscle Aches   E-Mycin [Erythromycin Base] Nausea And Vomiting    ROS: Negative except as per HPI above  Objective:  General: AAO x3, NAD  She has diffuse polyneuropathy.  Weakly palpable DP and nonpalpable PT pulses.  Foot is warm and well-perfused.  Normal cap fill time.  Thickened elongated nails x 5, ulceration has improved to  0.1 cm x 0.1 cm x 0.1 cm .  Surrounding hyperkeratosis.  Eschar present at the wound bed no malodor no drainage no significant erythema or edema of the hallux  Radiographs: Deferred Assessment:   1.  Ulcer of great toe, right, with fat layer exposed (HCC)   2. Diabetes mellitus due to underlying condition with diabetic autonomic neuropathy, unspecified whether long term insulin use (HCC)   3. History of amputation of leg through tibia and fibula, left (HCC)     Plan:  Patient was evaluated and treated and all questions answered.  Ulcer distal aspect of the right hallux to the level of subcutaneous fat tissue -Overall the wound appears improved and nearly fully healed at this time. -Previous had ordered MRI to assess for possible osteomyelitis however as the wound is now healed and the toe appears with less redness and edema we will hold off on MRI for now -ESR of 9 and CRP of 3 are reassuring that there is not infection in the hallux bone at this time -ABI PVR testing of the right lower extremity ordered and completed, ABI 1 indicates arterial flow within regular limits -We discussed the etiology and factors that are a part of the wound healing process.  We also discussed the risk of infection both soft tissue and osteomyelitis from open ulceration.  Discussed the risk of limb loss if this happens or worsens. -Debridement as below. -Dressed with Betadine, DSD. -No further dressing care needed at this time -Continue off-loading with surgical shoe. -Vascular testing completed as above -HgbA1c: Uncontrolled -Last antibiotics: No further antibiotics  indicated at this time continue to monitor for worsening redness and swelling of the great toe -Imaging: Hold on M RI at this time  Procedure: Excisional Debridement of Wound Rationale: Removal of non-viable soft tissue from the wound to promote healing.  Anesthesia: none Post-Debridement Wound Measurements: 0.1 cm x 0.1 cm x 0.1 cm  Type of Debridement: Sharp Excisional Tissue Removed: Non-viable soft tissue Depth of Debridement: subcutaneous tissue. Technique: Sharp excisional debridement to bleeding, viable wound base.  Dressing: Dry,  sterile, compression dressing. Disposition: Patient tolerated procedure well.       Return in about 4 weeks (around 10/21/2022) for f/u R hallux ulcer.          Corinna Gab, DPM [facility] / [facility]

## 2022-10-02 ENCOUNTER — Other Ambulatory Visit: Payer: Self-pay | Admitting: Podiatry

## 2022-10-06 DIAGNOSIS — M65332 Trigger finger, left middle finger: Secondary | ICD-10-CM | POA: Diagnosis not present

## 2022-10-07 ENCOUNTER — Ambulatory Visit: Payer: 59 | Admitting: Podiatry

## 2022-10-14 DIAGNOSIS — E1165 Type 2 diabetes mellitus with hyperglycemia: Secondary | ICD-10-CM | POA: Diagnosis not present

## 2022-10-20 DIAGNOSIS — J441 Chronic obstructive pulmonary disease with (acute) exacerbation: Secondary | ICD-10-CM | POA: Diagnosis not present

## 2022-10-22 ENCOUNTER — Ambulatory Visit: Payer: 59 | Admitting: Podiatry

## 2022-10-29 DIAGNOSIS — E119 Type 2 diabetes mellitus without complications: Secondary | ICD-10-CM | POA: Diagnosis not present

## 2022-10-29 DIAGNOSIS — H25811 Combined forms of age-related cataract, right eye: Secondary | ICD-10-CM | POA: Diagnosis not present

## 2022-11-09 DIAGNOSIS — G4733 Obstructive sleep apnea (adult) (pediatric): Secondary | ICD-10-CM | POA: Diagnosis not present

## 2022-11-14 DIAGNOSIS — E1165 Type 2 diabetes mellitus with hyperglycemia: Secondary | ICD-10-CM | POA: Diagnosis not present

## 2022-11-18 ENCOUNTER — Ambulatory Visit (INDEPENDENT_AMBULATORY_CARE_PROVIDER_SITE_OTHER): Payer: 59 | Admitting: Podiatry

## 2022-11-18 ENCOUNTER — Encounter: Payer: Self-pay | Admitting: Nurse Practitioner

## 2022-11-18 ENCOUNTER — Ambulatory Visit (INDEPENDENT_AMBULATORY_CARE_PROVIDER_SITE_OTHER): Payer: 59 | Admitting: Nurse Practitioner

## 2022-11-18 VITALS — BP 128/63 | HR 69 | Ht 63.0 in | Wt 275.0 lb

## 2022-11-18 DIAGNOSIS — E1165 Type 2 diabetes mellitus with hyperglycemia: Secondary | ICD-10-CM | POA: Diagnosis not present

## 2022-11-18 DIAGNOSIS — B351 Tinea unguium: Secondary | ICD-10-CM | POA: Diagnosis not present

## 2022-11-18 DIAGNOSIS — Z794 Long term (current) use of insulin: Secondary | ICD-10-CM

## 2022-11-18 DIAGNOSIS — L84 Corns and callosities: Secondary | ICD-10-CM | POA: Diagnosis not present

## 2022-11-18 DIAGNOSIS — Z7985 Long-term (current) use of injectable non-insulin antidiabetic drugs: Secondary | ICD-10-CM | POA: Diagnosis not present

## 2022-11-18 DIAGNOSIS — M79674 Pain in right toe(s): Secondary | ICD-10-CM | POA: Diagnosis not present

## 2022-11-18 DIAGNOSIS — Z7984 Long term (current) use of oral hypoglycemic drugs: Secondary | ICD-10-CM

## 2022-11-18 DIAGNOSIS — E782 Mixed hyperlipidemia: Secondary | ICD-10-CM

## 2022-11-18 DIAGNOSIS — E0843 Diabetes mellitus due to underlying condition with diabetic autonomic (poly)neuropathy: Secondary | ICD-10-CM

## 2022-11-18 DIAGNOSIS — Z89512 Acquired absence of left leg below knee: Secondary | ICD-10-CM

## 2022-11-18 DIAGNOSIS — I10 Essential (primary) hypertension: Secondary | ICD-10-CM | POA: Diagnosis not present

## 2022-11-18 MED ORDER — HUMULIN R U-500 KWIKPEN 500 UNIT/ML ~~LOC~~ SOPN: 90.0000 [IU] | PEN_INJECTOR | Freq: Three times a day (TID) | SUBCUTANEOUS | 3 refills | Status: DC

## 2022-11-18 NOTE — Progress Notes (Signed)
Endocrinology Follow Up Note       11/18/2022, 11:25 AM   Subjective:    Patient ID: Nicole [name], female    DOB: [birth year].  Nicole [name] is being seen in follow up after being seen in consultation for management of currently uncontrolled symptomatic diabetes requested by  Benita Stabile, MD.   Past Medical History:  Diagnosis Date   CAP (community acquired pneumonia)    Streptococcus 01/2011   COPD (chronic obstructive pulmonary disease) (HCC)    Coronary atherosclerosis of native coronary artery    a. Diagnosed [place] 2006 - DES RCA, reports followup cath 2009 at [facility] b. reported 2 stents in [place] in 2020. c. 07/2021: cath showing patent stents along RCA and mid-LAD with scattered 20% stenosis but no obstructive disease.   Dyslipidemia    Essential hypertension, benign    Morbid obesity (HCC)    Pancreatitis    Type 2 diabetes mellitus (HCC)     Past Surgical History:  Procedure Laterality Date   ABDOMINAL HERNIA REPAIR     ABDOMINAL HYSTERECTOMY     AMPUTATION Left 01/25/2020   Procedure: LEFT BELOW KNEE AMPUTATION;  Surgeon: [name], MD;  Location: [facility] OR;  Service: Orthopedics;  Laterality: Left;   CORONARY ANGIOPLASTY WITH STENT PLACEMENT  2006   KNEE SURGERY     LEFT HEART CATH AND CORONARY ANGIOGRAPHY N/A 08/14/2021   Procedure: LEFT HEART CATH AND CORONARY ANGIOGRAPHY;  Surgeon: Lennette Bihari, MD;  Location: [facility] INVASIVE CV LAB;  Service: Cardiovascular;  Laterality: N/A;   NOSE SURGERY     Pilonidal cystectomy     TONSILLECTOMY      Social History   Socioeconomic History   Marital status: Single    Spouse name: Not on file   Number of children: 3   Years of education: Not on file   Highest education level: Not on file  Occupational History   Occupation: Home health aide    Employer: [employer]  Tobacco Use   Smoking status: Former    Current  packs/day: 0.00    Average packs/day: 0.5 packs/day for 20.0 years (10.0 ttl pk-yrs)    Types: Cigarettes    Start date: 01/22/2000    Quit date: 01/22/2020    Years since quitting: 2.8   Smokeless tobacco: Never  Vaping Use   Vaping status: Never Used  Substance and Sexual Activity   Alcohol use: No   Drug use: No   Sexual activity: Yes    Birth control/protection: Surgical  Other Topics Concern   Not on file  Social History Narrative   Not on file   Social Determinants of Health   Financial Resource Strain: Not on file  Food Insecurity: No Food Insecurity (12/11/2021)   Hunger Vital Sign    Worried About Running Out of Food in the Last Year: Never true    Ran Out of Food in the Last Year: Never true  Transportation Needs: No Transportation Needs (12/11/2021)   PRAPARE - Administrator, Civil Service (Medical): No    Lack of Transportation (Non-Medical): No  Physical Activity: Not on file  Stress: Not  on file  Social Connections: Not on file    Family History  Problem Relation Age of Onset   Diabetes Mother    Heart failure Mother    Hypertension Mother    Kidney failure Mother    Ovarian cancer Mother    Asthma Son     Outpatient Encounter Medications as of 11/18/2022  Medication Sig   ACCU-CHEK GUIDE test strip USE 1 STRIP TO CHECK GLUCOSE 4 TIMES DAILY   acetaminophen (TYLENOL) 500 MG tablet Take 1 tablet (500 mg total) by mouth every 6 (six) hours as needed for moderate pain.   albuterol (PROVENTIL) (2.5 MG/3ML) 0.083% nebulizer solution Take 3 mLs (2.5 mg total) by nebulization every 6 (six) hours as needed for wheezing.   albuterol (VENTOLIN HFA) 108 (90 Base) MCG/ACT inhaler Inhale 2 puffs into the lungs every 6 (six) hours as needed for wheezing. Shortness of breath   blood glucose meter kit and supplies Dispense based on patient and insurance preference. Use four times daily as directed. (FOR ICD-10 E10.9, E11.9).   buPROPion ER (WELLBUTRIN SR)  100 MG 12 hr tablet Take 100 mg by mouth daily.   cephALEXin (KEFLEX) 500 MG capsule Take 1 capsule (500 mg total) by mouth 3 (three) times daily.   clopidogrel (PLAVIX) 75 MG tablet Take 1 tablet (75 mg total) by mouth daily.   EASY TOUCH INSULIN SYRINGE 31G X 5/16" 1 ML MISC    EPINEPHrine 0.3 mg/0.3 mL IJ SOAJ injection Inject 0.3 mg into the muscle as needed for anaphylaxis.   Insulin Pen Needle (B-D ULTRAFINE III SHORT PEN) 31G X 8 MM MISC USE AS DIRECTED   KERENDIA 10 MG TABS Take 1 tablet by mouth daily.   loperamide (IMODIUM) 2 MG capsule Take 1 capsule (2 mg total) by mouth every 6 (six) hours as needed for diarrhea or loose stools.   metoprolol tartrate (LOPRESSOR) 25 MG tablet Take 1 tablet (25 mg total) by mouth 2 (two) times daily.   Multiple Vitamins-Calcium (ONE-A-DAY WOMENS FORMULA) TABS Take 1 tablet by mouth daily.   mupirocin ointment (BACTROBAN) 2 % APPLY OINTMENT TOPICALLY ONCE DAILY   nitroGLYCERIN (NITROSTAT) 0.4 MG SL tablet DISSOLVE ONE TABLET UNDER THE TONGUE EVERY 5 MINUTES AS NEEDED FOR CHEST PAIN.  DO NOT EXCEED A TOTAL OF 3 DOSES IN 15 MINUTES   Nystatin (GERHARDT'S BUTT CREAM) CREA Apply 1 Application topically 2 (two) times daily.   nystatin powder Apply 1 Application topically daily as needed (irritation).   pantoprazole (PROTONIX) 40 MG tablet Take 1 tablet (40 mg total) by mouth daily. (Patient taking differently: Take 40 mg by mouth at bedtime.)   potassium chloride SA (KLOR-CON M) 20 MEQ tablet Take 1 tablet (20 mEq total) by mouth daily.   rosuvastatin (CRESTOR) 40 MG tablet Take 40 mg by mouth at bedtime.   Semaglutide, 2 MG/DOSE, 8 MG/3ML SOPN Inject 2 mg as directed once a week.   torsemide (DEMADEX) 20 MG tablet Take 2 tablets (40 mg total) by mouth daily.   Vitamin D, Ergocalciferol, (DRISDOL) 1.25 MG (50000 UNIT) CAPS capsule Take 50,000 Units by mouth every Monday.   VRAYLAR 3 MG capsule Take 3 mg by mouth daily.   [DISCONTINUED] dapagliflozin  propanediol (FARXIGA) 10 MG TABS tablet Take 1 tablet (10 mg total) by mouth daily.   [DISCONTINUED] insulin regular human CONCENTRATED (HUMULIN R U-500 KWIKPEN) 500 UNIT/ML KwikPen Inject 80 Units into the skin 3 (three) times daily with meals.   insulin regular  human CONCENTRATED (HUMULIN R U-500 KWIKPEN) 500 UNIT/ML KwikPen Inject 90 Units into the skin 3 (three) times daily with meals.   No facility-administered encounter medications on file as of 11/18/2022.    ALLERGIES: Allergies  Allergen Reactions   Aspirin Anaphylaxis and Other (See Comments)    Throat closing    Bee Venom Anaphylaxis   Pineapple Anaphylaxis   Definity [Perflutren Lipid Microsphere]     Muscle Aches   E-Mycin [Erythromycin Base] Nausea And Vomiting    VACCINATION STATUS: Immunization History  Administered Date(s) Administered   Influenza,inj,Quad PF,6+ Mos 12/25/2019, 12/23/2020   Moderna Sars-Covid-2 Vaccination 06/25/2019, 07/25/2019   Pneumococcal Polysaccharide-23 04/11/2012, 12/25/2019    Diabetes She presents for her follow-up diabetic visit. She has type 2 diabetes mellitus. Onset time: diagnosed at approx age of 49. Her disease course has been stable. There are no hypoglycemic associated symptoms. Associated symptoms include blurred vision, fatigue, foot paresthesias, polydipsia, polyuria and weight loss. There are no hypoglycemic complications. Symptoms are stable. Diabetic complications include heart disease, peripheral neuropathy and PVD. (Hx Left BKA, MI) Risk factors for coronary artery disease include diabetes mellitus, dyslipidemia, family history, obesity, hypertension and sedentary lifestyle. Current diabetic treatment includes intensive insulin program and oral agent (monotherapy) (and Ozempic). She is compliant with treatment most of the time. Her weight is decreasing steadily. She is following a generally unhealthy diet. When asked about meal planning, she reported none. She has not had a  previous visit with a dietitian. She rarely participates in exercise. Her home blood glucose trend is fluctuating minimally. Her breakfast blood glucose range is generally >200 mg/dl. Her lunch blood glucose range is generally >200 mg/dl. Her dinner blood glucose range is generally >200 mg/dl. Her bedtime blood glucose range is generally >200 mg/dl. (She presents today with her CGM showing gross hyperglycemia overall.  Her most recent A1c on 7/12 was 8.2%, essentially unchanged from previous visit.  Analysis of her CGM shows TIR 12%, TAR 88%, TBR 0%.  She notes a low in the 50s sometime last week overnight but I think this was a false low due to no symptoms and her laying on her sensor.  She notes she is still having trouble with yeast.) An ACE inhibitor/angiotensin II receptor blocker is being taken. She sees a podiatrist.Eye exam is current.  Hypertension This is a chronic problem. The current episode started more than 1 year ago. The problem has been gradually improving since onset. The problem is controlled. Associated symptoms include blurred vision. There are no associated agents to hypertension. Risk factors for coronary artery disease include diabetes mellitus, dyslipidemia, family history, obesity and sedentary lifestyle. Past treatments include ACE inhibitors, beta blockers and diuretics. The current treatment provides mild improvement. Compliance problems include diet and exercise.  Hypertensive end-organ damage includes CAD/MI, heart failure and PVD.  Hyperlipidemia This is a chronic problem. The current episode started more than 1 year ago. The problem is uncontrolled. Recent lipid tests were reviewed and are variable. Exacerbating diseases include diabetes and obesity. Factors aggravating her hyperlipidemia include beta blockers. Current antihyperlipidemic treatment includes statins. The current treatment provides mild improvement of lipids. Compliance problems include adherence to diet and  adherence to exercise.  Risk factors for coronary artery disease include diabetes mellitus, dyslipidemia, family history, obesity, hypertension and a sedentary lifestyle.     Review of systems  Constitutional: + decreasing body weight, current Body mass index is 48.71 kg/m., + fatigue, no subjective hyperthermia, no subjective hypothermia Eyes: + blurry vision, no  xerophthalmia ENT: no sore throat, no nodules palpated in throat, no dysphagia/odynophagia, no hoarseness Cardiovascular: no chest pain, no shortness of breath, no palpitations Respiratory: no cough, + shortness of breath- hx COPD on chronic O2 (not wearing today) Gastrointestinal: no nausea/vomiting/diarrhea Musculoskeletal: no muscle/joint aches, WC bound due to hx of L BKA- has prosthesis, has wound to right foot-sees podiatry today for wound management Skin: no rashes, no hyperemia, does note some yeast in abdominal folds/groin Neurological: no tremors, no numbness, no tingling, no dizziness Psychiatric: no depression, no anxiety  Objective:     BP 128/63 (BP Location: Right Arm, Patient Position: Sitting, Cuff Size: Large)   Pulse 69   Ht 5\' 3"  (1.6 m)   Wt 275 lb (124.7 kg) Comment: Reported by the patient - amputee  BMI 48.71 kg/m   Wt Readings from Last 3 Encounters:  11/18/22 275 lb (124.7 kg)  08/17/22 281 lb 6.4 oz (127.6 kg)  04/05/22 290 lb (131.5 kg)     BP Readings from Last 3 Encounters:  11/18/22 128/63  08/17/22 118/74  05/06/22 122/74     Physical Exam- Limited  Constitutional:  Body mass index is 48.71 kg/m. , not in acute distress, normal state of mind Eyes:  EOMI, no exophthalmos Musculoskeletal: WC bound- hx L BKA with prosthesis Skin:  no rashes, no hyperemia Neurological: no tremor with outstretched hands   Diabetic Foot Exam - Simple   No data filed    CMP ( most recent) CMP     Component Value Date/Time   NA 136 01/11/2022 1441   K 3.8 01/11/2022 1441   CL 98 01/11/2022  1441   CO2 26 01/11/2022 1441   GLUCOSE 423 (H) 01/11/2022 1441   BUN 34 (H) 01/11/2022 1441   CREATININE 1.40 (H) 01/11/2022 1441   CREATININE 0.86 07/30/2020 1558   CALCIUM 9.2 01/11/2022 1441   PROT 6.9 12/08/2021 0913   ALBUMIN 3.2 (L) 12/08/2021 0913   AST 26 12/08/2021 0913   ALT 19 12/08/2021 0913   ALKPHOS 79 12/08/2021 0913   BILITOT 0.4 12/08/2021 0913   GFRNONAA 44 (L) 01/11/2022 1441   GFRNONAA 76 07/30/2020 1558   GFRAA 88 07/30/2020 1558     Diabetic Labs (most recent): Lab Results  Component Value Date   HGBA1C 8.1 (A) 08/17/2022   HGBA1C 9.5 (H) 12/08/2021   HGBA1C 14.3 09/29/2021     Lipid Panel ( most recent) Lipid Panel     Component Value Date/Time   CHOL 109 12/15/2021 0551   TRIG 304 (H) 12/15/2021 0551   HDL 25 (L) 12/15/2021 0551   CHOLHDL 4.4 12/15/2021 0551   VLDL 61 (H) 12/15/2021 0551   LDLCALC 23 12/15/2021 0551   LDLCALC 65 12/25/2019 1359      Lab Results  Component Value Date   TSH 2.06 12/25/2019   TSH 2.800 04/11/2012   TSH 4.814 (H) 11/10/2011   TSH 2.672 02/19/2011   FREET4 1.2 12/25/2019           Assessment & Plan:   1) Type 2 diabetes mellitus with hyperglycemia, with long-term current use of insulin (HCC)  She presents today with her CGM showing gross hyperglycemia overall.  Her most recent A1c on 7/12 was 8.2%, essentially unchanged from previous visit.  Analysis of her CGM shows TIR 12%, TAR 88%, TBR 0%.  She notes a low in the 50s sometime last week overnight but I think this was a false low due to no symptoms and her  laying on her sensor.  She notes she is still having trouble with yeast.  - Nicole [name] has currently uncontrolled symptomatic type 2 DM since 57 years of age.   -Recent labs reviewed.  - I had a long discussion with her about the progressive nature of diabetes and the pathology behind its complications. -her diabetes is complicated by PVD- L BKA, MI, CAD, CHF and she remains at a high risk  for more acute and chronic complications which include CAD, CVA, CKD, retinopathy, and neuropathy. These are all discussed in detail with her.  - Nutritional counseling repeated at each appointment due to patients tendency to fall back in to old habits.  - The patient admits there is a room for improvement in their diet and drink choices. -  Suggestion is made for the patient to avoid simple carbohydrates from their diet including Cakes, Sweet Desserts / Pastries, Ice Cream, Soda (diet and regular), Sweet Tea, Candies, Chips, Cookies, Sweet Pastries, Store Bought Juices, Alcohol in Excess of 1-2 drinks a day, Artificial Sweeteners, Coffee Creamer, and "Sugar-free" Products. This will help patient to have stable blood glucose profile and potentially avoid unintended weight gain.   - I encouraged the patient to switch to unprocessed or minimally processed complex starch and increased protein intake (animal or plant source), fruits, and vegetables.   - Patient is advised to stick to a routine mealtimes to eat 3 meals a day and avoid unnecessary snacks (to snack only to correct hypoglycemia).  - she will be scheduled with Norm Salt, RDN, CDE for diabetes education.  - I have approached her with the following individualized plan to manage her diabetes and patient agrees:   -She is advised to increase her U500 to 90 units TID with meals if glucose is above 90 and she is eating and continue Ozempic 2 mg SQ weekly.  She is still having trouble with yeast in abdominal folds despite her medicated powder use.  I advised her to stop the [place] altogether at this time.    -she is encouraged to continue monitoring glucose 4 times daily, before meals and before bed (using her CGM), and to call the clinic if she has readings less than 70 or above 300 for 3 tests in a row.    - she is warned not to take insulin without proper monitoring per orders. - Adjustment parameters are given to her for hypo and  hyperglycemia in writing.  - Specific targets for  A1c; LDL, HDL, and Triglycerides were discussed with the patient.  2) Blood Pressure /Hypertension:  her blood pressure is controlled to target.   she is advised to continue her current medications including Lasix 80 mg po daily, Lisinopril 2.5 mg p.o. daily with breakfast, and Metoprolol 50 mg po twice daily.  3) Lipids/Hyperlipidemia:    Review of her recent lipid panel from 03/04/22 showed uncontrolled LDL at 1781 and elevated triglycerides of 544 (worsening).  she is advised to continue Crestor 40 mg daily at bedtime.  Side effects and precautions discussed with her.  She is also advised to avoid fried foods and butter.  4)  Weight/Diet:  her Body mass index is 48.71 kg/m.  -  clearly complicating her diabetes care.   she is a candidate for weight loss. I discussed with her the fact that loss of 5 - 10% of her  current body weight will have the most impact on her diabetes management.  Exercise, and detailed carbohydrates information provided  -  detailed on discharge instructions.  5) Chronic Care/Health Maintenance: -she is on ACEI/ARB and Statin medications and is encouraged to initiate and continue to follow up with Ophthalmology, Dentist, Podiatrist at least yearly or according to recommendations, and advised to stay away from smoking. I have recommended yearly flu vaccine and pneumonia vaccine at least every 5 years; moderate intensity exercise for up to 150 minutes weekly; and sleep for at least 7 hours a day.  - she is advised to maintain close follow up with Benita Stabile, MD for primary care needs, as well as her other providers for optimal and coordinated care.      I spent  46  minutes in the care of the patient today including review of labs from CMP, Lipids, Thyroid Function, Hematology (current and previous including abstractions from other facilities); face-to-face time discussing  her blood glucose readings/logs, discussing  hypoglycemia and hyperglycemia episodes and symptoms, medications doses, her options of short and long term treatment based on the latest standards of care / guidelines;  discussion about incorporating lifestyle medicine;  and documenting the encounter. Risk reduction counseling performed per USPSTF guidelines to reduce obesity and cardiovascular risk factors.     Please refer to Patient Instructions for Blood Glucose Monitoring and Insulin/Medications Dosing Guide"  in media tab for additional information. Please  also refer to " Patient Self Inventory" in the Media  tab for reviewed elements of pertinent patient history.  Nicole [name] participated in the discussions, expressed understanding, and voiced agreement with the above plans.  All questions were answered to her satisfaction. she is encouraged to contact clinic should she have any questions or concerns prior to her return visit.     Follow up plan: - Return in about 3 months (around 02/17/2023) for Diabetes F/U with A1c in office, No previsit labs, Bring meter and logs.   Ronny Bacon, [facility] [address] [place], [place] [address] Phone: [phone] Fax: [phone]  11/18/2022, 11:25 AM

## 2022-11-18 NOTE — Progress Notes (Signed)
Subjective:  Patient ID: Nicole [name], female    DOB: [birth year],  MRN: [id number]  Chief Complaint  Patient presents with   Wound Check    4 weeks for Ulcer check right hallux    57 y.o. female presents for ulcer check to the right hallux.  She has noted significant improvement since last visit.   She has has a callus present on the distal tuft of the right great toe no drainage noted at all.  Does want to inquire as to getting new diabetic shoes.  History of prior left lower extremity amputation  Past Medical History:  Diagnosis Date   CAP (community acquired pneumonia)    Streptococcus 01/2011   COPD (chronic obstructive pulmonary disease) (HCC)    Coronary atherosclerosis of native coronary artery    a. Diagnosed [place] 2006 - DES RCA, reports followup cath 2009 at [facility] b. reported 2 stents in [place] in 2020. c. 07/2021: cath showing patent stents along RCA and mid-LAD with scattered 20% stenosis but no obstructive disease.   Dyslipidemia    Essential hypertension, benign    Morbid obesity (HCC)    Pancreatitis    Type 2 diabetes mellitus (HCC)     Allergies  Allergen Reactions   Aspirin Anaphylaxis and Other (See Comments)    Throat closing    Bee Venom Anaphylaxis   Pineapple Anaphylaxis   Definity [Perflutren Lipid Microsphere]     Muscle Aches   E-Mycin [Erythromycin Base] Nausea And Vomiting    ROS: Negative except as per HPI above  Objective:  General: AAO x3, NAD  She has diffuse polyneuropathy.  Weakly palpable DP and nonpalpable PT pulses.  Foot is warm and well-perfused.  Normal cap fill time.  Thickened elongated nails x 5, at distal right hallux site of prior ulceration there is eschar and hyperkeratotic tissue however upon debridement of this hyperkeratotic callus there is no underlying ulceration.      Radiographs: Deferred Assessment:   1. Pre-ulcerative calluses   2. Pain due to onychomycosis of toenail of right foot   3. Diabetes  mellitus due to underlying condition with diabetic autonomic neuropathy, unspecified whether long term insulin use (HCC)   4. History of amputation of leg through tibia and fibula, left (HCC)      Plan:  Patient was evaluated and treated and all questions answered.  Ulcer distal aspect of the right hallux to the level of subcutaneous fat tissue -Ulceration fully healed at this visit no evidence of recurrence continue to monitor and protect the right great toe with gel toe cap which was dispensed to the patient All symptomatic hyperkeratoses x 1 at the distal tuft of the right great toe were safely debrided with a sterile #15 blade to patient's level of comfort without incident. We discussed preventative and palliative care of these lesions including supportive and accommodative shoegear, padding, prefabricated and custom molded accommodative orthoses, use of a pumice stone and lotions/creams daily. -Recommend diabetic shoe and liner for the right foot given preulcerative callus diabetes mellitus type 2 with peripheral neuropathy and prior left lower extremity amputation  #Onychomycosis with pain  -Nails palliatively debrided as below. -Educated on self-care  Procedure: Nail Debridement Rationale: Pain Type of Debridement: manual, sharp debridement. Instrumentation: Nail nipper, rotary burr. Number of Nails: 5  Return in about 9 weeks (around 01/20/2023) for [facility].          Corinna Gab, DPM [facility]

## 2022-11-20 DIAGNOSIS — J441 Chronic obstructive pulmonary disease with (acute) exacerbation: Secondary | ICD-10-CM | POA: Diagnosis not present

## 2022-11-26 DIAGNOSIS — H25812 Combined forms of age-related cataract, left eye: Secondary | ICD-10-CM | POA: Diagnosis not present

## 2022-11-26 DIAGNOSIS — G4733 Obstructive sleep apnea (adult) (pediatric): Secondary | ICD-10-CM | POA: Diagnosis not present

## 2022-12-02 ENCOUNTER — Other Ambulatory Visit: Payer: 59

## 2022-12-14 DIAGNOSIS — E1165 Type 2 diabetes mellitus with hyperglycemia: Secondary | ICD-10-CM | POA: Diagnosis not present

## 2022-12-20 DIAGNOSIS — J441 Chronic obstructive pulmonary disease with (acute) exacerbation: Secondary | ICD-10-CM | POA: Diagnosis not present

## 2022-12-23 ENCOUNTER — Ambulatory Visit: Payer: 59

## 2022-12-23 DIAGNOSIS — E0843 Diabetes mellitus due to underlying condition with diabetic autonomic (poly)neuropathy: Secondary | ICD-10-CM

## 2022-12-23 DIAGNOSIS — L84 Corns and callosities: Secondary | ICD-10-CM

## 2022-12-23 DIAGNOSIS — Z89512 Acquired absence of left leg below knee: Secondary | ICD-10-CM

## 2022-12-23 DIAGNOSIS — L97512 Non-pressure chronic ulcer of other part of right foot with fat layer exposed: Secondary | ICD-10-CM

## 2022-12-23 NOTE — Progress Notes (Signed)
Patient presents to the office today for diabetic shoe and insole measuring.  Patient was measured with brannock device to determine size and width for 1 pair of extra depth shoes and foam casted for 3ea custom Inserts Right only Left BKA .   Documentation of medical necessity will be sent to patient's treating diabetic doctor to verify and sign.   Patient's diabetic provider: Ronny Bacon NP   Shoes and insoles will be ordered at that time and patient will be notified for an appointment for fitting when they arrive.   Shoe size (per patient): 9.5 Brannock measurement: 9 Patient shoe selection- Shoe choice:   A700W / A300W Shoe size ordered: 9.5WD Financials signed

## 2022-12-28 DIAGNOSIS — G4733 Obstructive sleep apnea (adult) (pediatric): Secondary | ICD-10-CM | POA: Diagnosis not present

## 2022-12-28 DIAGNOSIS — Z79899 Other long term (current) drug therapy: Secondary | ICD-10-CM | POA: Diagnosis not present

## 2022-12-28 DIAGNOSIS — G250 Essential tremor: Secondary | ICD-10-CM | POA: Diagnosis not present

## 2023-01-02 NOTE — Progress Notes (Deleted)
Cardiology Office Note   Date:  01/02/2023   ID:  [name], Nicole 1965/06/28, MRN [id number]  PCP:  Benita Stabile, MD  Cardiologist:   [name], MD   Patient presents for follow up of CAD    History of Present Illness: Nicole [name] is a 57 y.o. female with a history of HTN, Type II DM (x 30 years), COPD, RBBB, HL, obesity and CAD.  S/P PTCA stent 2009 ([facility]) Echo in 2013 LVEF normal  Myoview in May 2013 was normal    Pt moved away  to [place]    While there had 2 more stents placed [employer])   She says she was also noted to have an irregular herart beat that was treated with meds    I saw the pt in clinic in 2022    In Jun 2023 she had a L heart cath that showed patient stents to RCA and LAD      Since seen she her breathing is getting better   Still using O2 Denies CP  Denies signficant palpitatations  Ran out of Crestor   Back on it now     Due to have labs in April Endocrine helping with sugars  I saw the pt in Feb 2024  No outpatient medications have been marked as taking for the 01/05/23 encounter (Appointment) with Pricilla Riffle, MD.     Allergies:   Aspirin, Bee venom, Pineapple, Definity [perflutren lipid microsphere], and E-mycin [erythromycin base]   Past Medical History:  Diagnosis Date   CAP (community acquired pneumonia)    Streptococcus 01/2011   COPD (chronic obstructive pulmonary disease) (HCC)    Coronary atherosclerosis of native coronary artery    a. Diagnosed [place] 2006 - DES RCA, reports followup cath 2009 at [facility] b. reported 2 stents in [place] in 2020. c. 07/2021: cath showing patent stents along RCA and mid-LAD with scattered 20% stenosis but no obstructive disease.   Dyslipidemia    Essential hypertension, benign    Morbid obesity (HCC)    Pancreatitis    Type 2 diabetes mellitus (HCC)     Past Surgical History:  Procedure Laterality Date   ABDOMINAL HERNIA REPAIR     ABDOMINAL HYSTERECTOMY     AMPUTATION Left  01/25/2020   Procedure: LEFT BELOW KNEE AMPUTATION;  Surgeon: [name], MD;  Location: [facility] OR;  Service: Orthopedics;  Laterality: Left;   CORONARY ANGIOPLASTY WITH STENT PLACEMENT  2006   KNEE SURGERY     LEFT HEART CATH AND CORONARY ANGIOGRAPHY N/A 08/14/2021   Procedure: LEFT HEART CATH AND CORONARY ANGIOGRAPHY;  Surgeon: Lennette Bihari, MD;  Location: [facility] INVASIVE CV LAB;  Service: Cardiovascular;  Laterality: N/A;   NOSE SURGERY     Pilonidal cystectomy     TONSILLECTOMY       Social History:  The patient  reports that she quit smoking about 2 years ago. Her smoking use included cigarettes. She started smoking about 22 years ago. She has a 10 pack-year smoking history. She has never used smokeless tobacco. She reports that she does not drink alcohol and does not use drugs.   Family History:  The patient's family history includes Asthma in her son; Diabetes in her mother; Heart failure in her mother; Hypertension in her mother; Kidney failure in her mother; Ovarian cancer in her mother.    ROS:  Please see the history of present illness. All other systems are reviewed and  Negative to the above problem except as noted.    PHYSICAL EXAM: VS:  There were no vitals taken for this visit.  GEN: Morbidly obese 57 yo  in no acute distress  HEENT: normal  Neck: no JVD, carotid bruits, Cardiac: RRR; no murmurs,  Tr LE edema  Respiratory:  clear to auscultation bilaterally   GI: soft, nontender, nondistended, + BS  No hepatomegaly  MS: no deformity Moving all extremities   Skin: warm and dry, no rash Neuro:  Strength and sensation are intact Psych: euthymic mood, full affect   EKG:  EKG is not ordered today.   Lipid Panel    Component Value Date/Time   CHOL 109 12/15/2021 0551   TRIG 304 (H) 12/15/2021 0551   HDL 25 (L) 12/15/2021 0551   CHOLHDL 4.4 12/15/2021 0551   VLDL 61 (H) 12/15/2021 0551   LDLCALC 23 12/15/2021 0551   LDLCALC 65 12/25/2019 1359      Wt Readings  from Last 3 Encounters:  11/18/22 275 lb (124.7 kg)  08/17/22 281 lb 6.4 oz (127.6 kg)  04/05/22 290 lb (131.5 kg)      ASSESSMENT AND PLAN:  1   CAD  Pt with remote interventions.   Cth in fall 2023 showed patent stents    PT without CP   Follow   Keep on Plavix  Not on ASA   3   Arrhythmia  PT had been on eliquis   No documented arrhythmias here   Follo       4  HL  Last LDL 178   She has just restarted statin   WIll need to follow    5  HTN  BP is fair  Follow  6 DM  Discussed diet A1C 10.9   7   Morbid obesity   Discussed diet sum  Mediterranian diet        Current medicines are reviewed at length with the patient today.  The patient does not have concerns regarding medicines.  Signed, [name], MD  01/02/2023 10:18 PM    [facility] [address] [place], [place], [place]  [address] Phone: [phone]; Fax: [phone]

## 2023-01-04 DIAGNOSIS — J449 Chronic obstructive pulmonary disease, unspecified: Secondary | ICD-10-CM | POA: Diagnosis not present

## 2023-01-05 ENCOUNTER — Other Ambulatory Visit: Payer: Self-pay | Admitting: Physician Assistant

## 2023-01-05 ENCOUNTER — Encounter: Payer: Self-pay | Admitting: Internal Medicine

## 2023-01-05 ENCOUNTER — Ambulatory Visit: Payer: 59 | Attending: Internal Medicine | Admitting: Internal Medicine

## 2023-01-13 ENCOUNTER — Telehealth: Payer: Self-pay | Admitting: "Endocrinology

## 2023-01-13 NOTE — Telephone Encounter (Signed)
Faxed w/ last OV note

## 2023-01-13 NOTE — Telephone Encounter (Signed)
Dr Fransico Him [facility] is requesting your signature for the form on your desk. Please let me know when completed and I will fax it  Thanks

## 2023-01-20 DIAGNOSIS — J441 Chronic obstructive pulmonary disease with (acute) exacerbation: Secondary | ICD-10-CM | POA: Diagnosis not present

## 2023-01-27 ENCOUNTER — Ambulatory Visit: Payer: 59 | Admitting: Podiatry

## 2023-02-04 ENCOUNTER — Ambulatory Visit
Admission: RE | Admit: 2023-02-04 | Discharge: 2023-02-04 | Disposition: A | Discharge status: Home / Self Care | Payer: 59 | Source: Ambulatory Visit | Attending: Family Medicine

## 2023-02-04 VITALS — BP 152/83 | HR 66 | Temp 97.7°F | Resp 18

## 2023-02-04 DIAGNOSIS — B372 Candidiasis of skin and nail: Secondary | ICD-10-CM

## 2023-02-04 MED ORDER — NYSTATIN 100000 UNIT/GM EX CREA: TOPICAL_CREAM | CUTANEOUS | 0 refills | Status: DC

## 2023-02-04 MED ORDER — FLUCONAZOLE 150 MG PO TABS: 150.0000 mg | ORAL_TABLET | Freq: Every day | ORAL | 0 refills | Status: DC

## 2023-02-04 MED ORDER — NYSTATIN 100000 UNIT/GM EX POWD: 1.0000 | Freq: Two times a day (BID) | CUTANEOUS | 0 refills | Status: DC

## 2023-02-04 NOTE — ED Triage Notes (Signed)
Pt reports she has a rash under left breast x 1 month Pt states when she puts her bra the rash turns her bra green and it has an odor.

## 2023-02-08 NOTE — ED Provider Notes (Signed)
[facility]    CSN: [id number] Arrival date & time: 02/04/23  1256      History   Chief Complaint Chief Complaint  Patient presents with   Rash    Entered by patient    HPI Nicole [name] is a 57 y.o. female.   Presenting today with rash under left breast for 1 month that itches and burns. States the rash turns her bra green and has an odor. Denies new soaps or detergents, new foods, new medications. So far trying OTC foot powders with minimal relief.    Past Medical History:  Diagnosis Date   CAP (community acquired pneumonia)    Streptococcus 01/2011   COPD (chronic obstructive pulmonary disease) (HCC)    Coronary atherosclerosis of native coronary artery    a. Diagnosed [place] 2006 - DES RCA, reports followup cath 2009 at [facility] b. reported 2 stents in [place] in 2020. c. 07/2021: cath showing patent stents along RCA and mid-LAD with scattered 20% stenosis but no obstructive disease.   Dyslipidemia    Essential hypertension, benign    Morbid obesity (HCC)    Pancreatitis    Type 2 diabetes mellitus [facility])     Patient Active Problem List   Diagnosis Date Noted   Acute diastolic CHF (congestive heart failure) (HCC) 12/10/2021   Acute on chronic heart failure with preserved ejection fraction (HCC) 12/09/2021   OSA on CPAP 12/09/2021   Non-cardiac chest pain 12/09/2021   Chest pain 12/08/2021   Hx of BKA, left (HCC) 09/09/2020   Vitamin D deficiency 07/30/2020   Anxiety and depression 07/30/2020   Atrial fibrillation (HCC) 07/30/2020   Morbid obesity with body mass index (BMI) of 50.0 to 59.9 in adult (HCC) 01/23/2020   Tobacco abuse--2 P/day Smoker 01/23/2020   Uncontrolled type 2 diabetes mellitus with hyperglycemia, with long-term current use of insulin (HCC) 01/22/2020   Elevated alpha fetoprotein 01/22/2020   Diabetic ulcer of left foot (HCC) 12/05/2019   TIA (transient ischemic attack) 04/10/2012   Acute diastolic congestive heart failure  (HCC) 11/12/2011   COPD (chronic obstructive pulmonary disease) (HCC) 11/09/2011   Coronary artery disease involving native coronary artery of native heart with angina pectoris (HCC) 02/19/2011   Dyslipidemia    Type 2 diabetes mellitus with obesity (HCC)    Primary hypertension     Past Surgical History:  Procedure Laterality Date   ABDOMINAL HERNIA REPAIR     ABDOMINAL HYSTERECTOMY     AMPUTATION Left 01/25/2020   Procedure: LEFT BELOW KNEE AMPUTATION;  Surgeon: [name], MD;  Location: [facility] OR;  Service: Orthopedics;  Laterality: Left;   CORONARY ANGIOPLASTY WITH STENT PLACEMENT  2006   KNEE SURGERY     LEFT HEART CATH AND CORONARY ANGIOGRAPHY N/A 08/14/2021   Procedure: LEFT HEART CATH AND CORONARY ANGIOGRAPHY;  Surgeon: Lennette Bihari, MD;  Location: [facility];  Service: Cardiovascular;  Laterality: N/A;   NOSE SURGERY     Pilonidal cystectomy     TONSILLECTOMY      OB History     Gravida      Para      Term      Preterm      AB      Living  4      SAB      IAB      Ectopic      Multiple      Live Births  Home Medications    Prior to Admission medications   Medication Sig Start Date End Date Taking? Authorizing Provider  fluconazole (DIFLUCAN) 150 MG tablet Take 1 tablet (150 mg total) by mouth daily. 02/04/23  Yes Particia Nearing, PA-C  nystatin (MYCOSTATIN/NYSTOP) powder Apply 1 Application topically 2 (two) times daily. 02/04/23  Yes Particia Nearing, PA-C  nystatin cream (MYCOSTATIN) Apply to affected area 2 times daily 02/04/23  Yes Particia Nearing, PA-C  ACCU-CHEK GUIDE test strip USE 1 STRIP TO CHECK GLUCOSE 4 TIMES DAILY 03/11/21   Dani Gobble, NP  acetaminophen (TYLENOL) 500 MG tablet Take 1 tablet (500 mg total) by mouth every 6 (six) hours as needed for moderate pain. 02/05/20   Amin, Ankit C, MD  albuterol (PROVENTIL) (2.5 MG/3ML) 0.083% nebulizer solution Take 3 mLs (2.5 mg total) by  nebulization every 6 (six) hours as needed for wheezing. 07/30/20   Elenore Paddy, NP  albuterol (VENTOLIN HFA) 108 (90 Base) MCG/ACT inhaler Inhale 2 puffs into the lungs every 6 (six) hours as needed for wheezing. Shortness of breath 07/30/20   Elenore Paddy, NP  blood glucose meter kit and supplies Dispense based on patient and insurance preference. Use four times daily as directed. (FOR ICD-10 E10.9, E11.9). 01/09/21   Dani Gobble, NP  buPROPion [facility] SR) 100 MG 12 hr tablet Take 100 mg by mouth daily.    [provider]  cephALEXin (KEFLEX) 500 MG capsule Take 1 capsule (500 mg total) by mouth 3 (three) times daily. 07/01/22   Edwin Cap, DPM  clopidogrel (PLAVIX) 75 MG tablet Take 1 tablet (75 mg total) by mouth daily. 09/09/22   Pricilla Riffle, MD  EASY [facility] INSULIN SYRINGE 31G X 5/16" 1 ML MISC  09/21/19   [provider]  EPINEPHrine 0.3 mg/0.3 mL IJ SOAJ injection Inject 0.3 mg into the muscle as needed for anaphylaxis. 05/15/20   Elenore Paddy, NP  Insulin Pen Needle (B-D ULTRAFINE III SHORT PEN) 31G X 8 MM MISC USE AS DIRECTED 07/06/21   Rema Fendt, NP  insulin regular human CONCENTRATED (HUMULIN R U-500 KWIKPEN) 500 UNIT/ML KwikPen Inject 90 Units into the skin 3 (three) times daily with meals. 11/18/22   Dani Gobble, NP  KERENDIA 10 MG TABS Take 1 tablet by mouth daily. 09/25/21   [provider]  KLOR-CON M20 20 MEQ tablet Take 1 tablet by mouth once daily 01/06/23   Pricilla Riffle, MD  loperamide (IMODIUM) 2 MG capsule Take 1 capsule (2 mg total) by mouth every 6 (six) hours as needed for diarrhea or loose stools. 12/10/19   Erick Blinks, MD  metoprolol tartrate (LOPRESSOR) 25 MG tablet Take 1 tablet (25 mg total) by mouth 2 (two) times daily. 12/17/21   Vassie Loll, MD  Multiple Vitamins-Calcium (ONE-A-DAY WOMENS FORMULA) TABS Take 1 tablet by mouth daily.    [provider]  mupirocin ointment (BACTROBAN) 2 % APPLY  OINTMENT TOPICALLY ONCE DAILY 09/21/22   Standiford, Jenelle Mages, DPM  nitroGLYCERIN (NITROSTAT) 0.4 MG SL tablet DISSOLVE ONE TABLET UNDER THE TONGUE EVERY 5 MINUTES AS NEEDED FOR CHEST PAIN.  DO NOT EXCEED A TOTAL OF 3 DOSES IN 15 MINUTES 09/07/22   Pricilla Riffle, MD  Nystatin (GERHARDT'S BUTT CREAM) CREA Apply 1 Application topically 2 (two) times daily. 08/15/21   Bhagat, Sharrell Ku, PA  nystatin powder Apply 1 Application topically daily as needed (irritation).    [provider]  pantoprazole (PROTONIX) 40 MG tablet Take 1 tablet (40 mg total) by mouth daily. Patient taking differently: Take 40 mg by mouth at bedtime. 07/30/20   Elenore Paddy, NP  rosuvastatin (CRESTOR) 40 MG tablet Take 40 mg by mouth at bedtime. 04/16/21   [provider]  Semaglutide, 2 MG/DOSE, 8 MG/3ML SOPN Inject 2 mg as directed once a week. 05/06/22   Dani Gobble, NP  torsemide (DEMADEX) 20 MG tablet Take 2 tablets (40 mg total) by mouth daily. 01/20/22 01/15/23  Dyann Kief, PA-C  Vitamin D, Ergocalciferol, (DRISDOL) 1.25 MG (50000 UNIT) CAPS capsule Take 50,000 Units by mouth every Monday. 04/16/21   [provider]  VRAYLAR 3 MG capsule Take 3 mg by mouth daily. 11/15/21   [provider]    Family History Family History  Problem Relation Age of Onset   Diabetes Mother    Heart failure Mother    Hypertension Mother    Kidney failure Mother    Ovarian cancer Mother    Asthma Son     Social History Social History   Tobacco Use   Smoking status: Former    Current packs/day: 0.00    Average packs/day: 0.5 packs/day for 20.0 years (10.0 ttl pk-yrs)    Types: Cigarettes    Start date: 01/22/2000    Quit date: 01/22/2020    Years since quitting: 3.0   Smokeless tobacco: Never  Vaping Use   Vaping status: Never Used  Substance Use Topics   Alcohol use: No   Drug use: No     Allergies   Aspirin, Bee venom, Pineapple, Definity [perflutren lipid microsphere],  and E-mycin [erythromycin base]   Review of Systems Review of Systems PER HPI  Physical Exam Triage Vital Signs ED Triage Vitals  Encounter Vitals Group     BP 02/04/23 1306 (!) 152/83     Systolic BP Percentile --      Diastolic BP Percentile --      Pulse Rate 02/04/23 1306 66     Resp 02/04/23 1306 18     Temp 02/04/23 1306 97.7 F (36.5 C)     Temp Source 02/04/23 1306 Oral     SpO2 02/04/23 1306 94 %     Weight --      Height --      Head Circumference --      Peak Flow --      Pain Score 02/04/23 1307 0     Pain Loc --      Pain Education --      Exclude from Growth Chart --    No data found.  Updated Vital Signs BP (!) 152/83 (BP Location: Right Arm)   Pulse 66   Temp 97.7 F (36.5 C) (Oral)   Resp 18   SpO2 94%   Visual Acuity Right Eye Distance:   Left Eye Distance:   Bilateral Distance:    Right Eye Near:   Left Eye Near:    Bilateral Near:     Physical Exam Vitals and nursing note reviewed.  Constitutional:      Appearance: Normal appearance. She is not ill-appearing.  HENT:     Head: Atraumatic.  Eyes:     Extraocular Movements: Extraocular movements intact.     Conjunctiva/sclera: Conjunctivae normal.  Cardiovascular:     Rate and Rhythm: Normal rate and regular rhythm.     Heart sounds: Normal heart sounds.  Pulmonary:     Effort:  Pulmonary effort is normal.     Breath sounds: Normal breath sounds.  Musculoskeletal:        General: Normal range of motion.     Cervical back: Normal range of motion and neck supple.  Skin:    General: Skin is warm and dry.     Findings: Rash present.     Comments: Erythematous macerated rash below left breast  Neurological:     Mental Status: She is alert and oriented to person, place, and time.  Psychiatric:        Mood and Affect: Mood normal.        Thought Content: Thought content normal.        Judgment: Judgment normal.      UC Treatments / Results  Labs (all labs ordered are listed,  but only abnormal results are displayed) Labs Reviewed - No data to display  EKG   Radiology No results found.  Procedures Procedures (including critical care time)  Medications Ordered in UC Medications - No data to display  Initial Impression / Assessment and Plan / UC Course  I have reviewed the triage vital signs and the nursing notes.  Pertinent labs & imaging results that were available during my care of the patient were reviewed by me and considered in my medical decision making (see chart for details).     Treat with short course of diflucan, nystatin cream and powder and good hygiene practices. Return for worsening sxs  Final Clinical Impressions(s) / UC Diagnoses   Final diagnoses:  Yeast dermatitis   Discharge Instructions   None    ED Prescriptions     Medication Sig Dispense Auth. Provider   fluconazole (DIFLUCAN) 150 MG tablet Take 1 tablet (150 mg total) by mouth daily. 5 tablet Particia Nearing, [place]   nystatin cream (MYCOSTATIN) Apply to affected area 2 times daily 60 g Particia Nearing, [place]   nystatin (MYCOSTATIN/NYSTOP) powder Apply 1 Application topically 2 (two) times daily. 60 g Particia Nearing, [place]      PDMP not reviewed this encounter.   Particia Nearing, [place] 02/08/23 2133

## 2023-02-19 DIAGNOSIS — J441 Chronic obstructive pulmonary disease with (acute) exacerbation: Secondary | ICD-10-CM | POA: Diagnosis not present

## 2023-03-01 ENCOUNTER — Other Ambulatory Visit: Payer: Self-pay | Admitting: *Deleted

## 2023-03-10 ENCOUNTER — Encounter: Payer: Self-pay | Admitting: Nurse Practitioner

## 2023-03-10 ENCOUNTER — Ambulatory Visit (INDEPENDENT_AMBULATORY_CARE_PROVIDER_SITE_OTHER): Payer: 59 | Admitting: Nurse Practitioner

## 2023-03-10 VITALS — BP 136/76 | HR 70 | Ht 64.0 in | Wt 252.0 lb

## 2023-03-10 DIAGNOSIS — Z7985 Long-term (current) use of injectable non-insulin antidiabetic drugs: Secondary | ICD-10-CM | POA: Diagnosis not present

## 2023-03-10 DIAGNOSIS — E1122 Type 2 diabetes mellitus with diabetic chronic kidney disease: Secondary | ICD-10-CM | POA: Diagnosis not present

## 2023-03-10 DIAGNOSIS — E782 Mixed hyperlipidemia: Secondary | ICD-10-CM | POA: Diagnosis not present

## 2023-03-10 DIAGNOSIS — Z7984 Long term (current) use of oral hypoglycemic drugs: Secondary | ICD-10-CM | POA: Diagnosis not present

## 2023-03-10 DIAGNOSIS — Z794 Long term (current) use of insulin: Secondary | ICD-10-CM

## 2023-03-10 DIAGNOSIS — E1165 Type 2 diabetes mellitus with hyperglycemia: Secondary | ICD-10-CM

## 2023-03-10 DIAGNOSIS — E559 Vitamin D deficiency, unspecified: Secondary | ICD-10-CM | POA: Diagnosis not present

## 2023-03-10 DIAGNOSIS — E02 Subclinical iodine-deficiency hypothyroidism: Secondary | ICD-10-CM | POA: Diagnosis not present

## 2023-03-10 DIAGNOSIS — I10 Essential (primary) hypertension: Secondary | ICD-10-CM

## 2023-03-10 LAB — POCT GLYCOSYLATED HEMOGLOBIN (HGB A1C): Hemoglobin A1C: 13.5 % — AB (ref 4.0–5.6)

## 2023-03-10 MED ORDER — ACCU-CHEK GUIDE TEST VI STRP: ORAL_STRIP | 12 refills | Status: AC

## 2023-03-10 MED ORDER — HUMULIN R U-500 KWIKPEN 500 UNIT/ML ~~LOC~~ SOPN: 90.0000 [IU] | PEN_INJECTOR | Freq: Three times a day (TID) | SUBCUTANEOUS | 3 refills | Status: DC

## 2023-03-10 MED ORDER — ACCU-CHEK SOFTCLIX LANCETS MISC: 12 refills | Status: AC

## 2023-03-10 MED ORDER — SEMAGLUTIDE (2 MG/DOSE) 8 MG/3ML ~~LOC~~ SOPN: 2.0000 mg | PEN_INJECTOR | SUBCUTANEOUS | 3 refills | Status: DC

## 2023-03-10 NOTE — Progress Notes (Signed)
Endocrinology Follow Up Note       03/10/2023, 10:04 AM   Subjective:    Patient ID: Nicole [name], female    DOB: Feb 24, 1965.  Nicole [name] is being seen in follow up after being seen in consultation for management of currently uncontrolled symptomatic diabetes requested by  Benita Stabile, MD.   Past Medical History:  Diagnosis Date   CAP (community acquired pneumonia)    Streptococcus 01/2011   COPD (chronic obstructive pulmonary disease) (HCC)    Coronary atherosclerosis of native coronary artery    a. Diagnosed [place] 2006 - DES RCA, reports followup cath 2009 at [facility] b. reported 2 stents in [place] in 2020. c. 07/2021: cath showing patent stents along RCA and mid-LAD with scattered 20% stenosis but no obstructive disease.   Dyslipidemia    Essential hypertension, benign    Morbid obesity (HCC)    Pancreatitis    Type 2 diabetes mellitus (HCC)     Past Surgical History:  Procedure Laterality Date   ABDOMINAL HERNIA REPAIR     ABDOMINAL HYSTERECTOMY     AMPUTATION Left 01/25/2020   Procedure: LEFT BELOW KNEE AMPUTATION;  Surgeon: [name], MD;  Location: [facility] OR;  Service: Orthopedics;  Laterality: Left;   CORONARY ANGIOPLASTY WITH STENT PLACEMENT  2006   KNEE SURGERY     LEFT HEART CATH AND CORONARY ANGIOGRAPHY N/A 08/14/2021   Procedure: LEFT HEART CATH AND CORONARY ANGIOGRAPHY;  Surgeon: Lennette Bihari, MD;  Location: [facility];  Service: Cardiovascular;  Laterality: N/A;   NOSE SURGERY     Pilonidal cystectomy     TONSILLECTOMY      Social History   Socioeconomic History   Marital status: Single    Spouse name: Not on file   Number of children: 3   Years of education: Not on file   Highest education level: Not on file  Occupational History   Occupation: Home health aide    Employer: [employer]  Tobacco Use   Smoking status: Former    Current  packs/day: 0.00    Average packs/day: 0.5 packs/day for 20.0 years (10.0 ttl pk-yrs)    Types: Cigarettes    Start date: 01/22/2000    Quit date: 01/22/2020    Years since quitting: 3.1   Smokeless tobacco: Never  Vaping Use   Vaping status: Never Used  Substance and Sexual Activity   Alcohol use: No   Drug use: No   Sexual activity: Yes    Birth control/protection: Surgical  Other Topics Concern   Not on file  Social History Narrative   Not on file   Social Drivers of Health   Financial Resource Strain: Not on file  Food Insecurity: No Food Insecurity (12/11/2021)   Hunger Vital Sign    Worried About Running Out of Food in the Last Year: Never true    Ran Out of Food in the Last Year: Never true  Transportation Needs: No Transportation Needs (12/11/2021)   PRAPARE - Administrator, Civil Service (Medical): No    Lack of Transportation (Non-Medical): No  Physical Activity: Not on file  Stress: Not  on file  Social Connections: Not on file    Family History  Problem Relation Age of Onset   Diabetes Mother    Heart failure Mother    Hypertension Mother    Kidney failure Mother    Ovarian cancer Mother    Asthma Son     Outpatient Encounter Medications as of 03/10/2023  Medication Sig   Accu-Chek Softclix Lancets lancets Use as instructed to monitor glucose 4 times daily- use as backup to Dexcom   acetaminophen (TYLENOL) 500 MG tablet Take 1 tablet (500 mg total) by mouth every 6 (six) hours as needed for moderate pain.   albuterol (PROVENTIL) (2.5 MG/3ML) 0.083% nebulizer solution Take 3 mLs (2.5 mg total) by nebulization every 6 (six) hours as needed for wheezing.   albuterol (VENTOLIN HFA) 108 (90 Base) MCG/ACT inhaler Inhale 2 puffs into the lungs every 6 (six) hours as needed for wheezing. Shortness of breath   blood glucose meter kit and supplies Dispense based on patient and insurance preference. Use four times daily as directed. (FOR ICD-10 E10.9,  E11.9).   clopidogrel (PLAVIX) 75 MG tablet Take 1 tablet (75 mg total) by mouth daily.   EASY TOUCH INSULIN SYRINGE 31G X 5/16" 1 ML MISC    EPINEPHrine 0.3 mg/0.3 mL IJ SOAJ injection Inject 0.3 mg into the muscle as needed for anaphylaxis.   glucose blood (ACCU-CHEK GUIDE TEST) test strip Use as instructed to monitor glucose 4 times daily- use as back up for Dexcom   Insulin Pen Needle (B-D ULTRAFINE III SHORT PEN) 31G X 8 MM MISC USE AS DIRECTED   KLOR-CON M20 20 MEQ tablet Take 1 tablet by mouth once daily   loperamide (IMODIUM) 2 MG capsule Take 1 capsule (2 mg total) by mouth every 6 (six) hours as needed for diarrhea or loose stools.   Multiple Vitamins-Calcium (ONE-A-DAY WOMENS FORMULA) TABS Take 1 tablet by mouth daily.   mupirocin ointment (BACTROBAN) 2 % APPLY OINTMENT TOPICALLY ONCE DAILY   nitroGLYCERIN (NITROSTAT) 0.4 MG SL tablet DISSOLVE ONE TABLET UNDER THE TONGUE EVERY 5 MINUTES AS NEEDED FOR CHEST PAIN.  DO NOT EXCEED A TOTAL OF 3 DOSES IN 15 MINUTES   Nystatin (GERHARDT'S BUTT CREAM) CREA Apply 1 Application topically 2 (two) times daily.   pantoprazole (PROTONIX) 40 MG tablet Take 1 tablet (40 mg total) by mouth daily. (Patient taking differently: Take 40 mg by mouth at bedtime.)   rosuvastatin (CRESTOR) 40 MG tablet Take 40 mg by mouth at bedtime.   torsemide (DEMADEX) 20 MG tablet Take 2 tablets (40 mg total) by mouth daily.   Vitamin D, Ergocalciferol, (DRISDOL) 1.25 MG (50000 UNIT) CAPS capsule Take 50,000 Units by mouth every Monday.   VRAYLAR 3 MG capsule Take 3 mg by mouth daily.   [DISCONTINUED] ACCU-CHEK GUIDE test strip USE 1 STRIP TO CHECK GLUCOSE 4 TIMES DAILY   [DISCONTINUED] insulin regular human CONCENTRATED (HUMULIN R U-500 KWIKPEN) 500 UNIT/ML KwikPen Inject 90 Units into the skin 3 (three) times daily with meals.   [DISCONTINUED] Semaglutide, 2 MG/DOSE, 8 MG/3ML SOPN Inject 2 mg as directed once a week.   insulin regular human CONCENTRATED (HUMULIN R U-500  KWIKPEN) 500 UNIT/ML KwikPen Inject 90 Units into the skin 3 (three) times daily with meals.   Semaglutide, 2 MG/DOSE, 8 MG/3ML SOPN Inject 2 mg as directed once a week.   [DISCONTINUED] buPROPion ER (WELLBUTRIN SR) 100 MG 12 hr tablet Take 100 mg by mouth daily.   [DISCONTINUED]  cephALEXin (KEFLEX) 500 MG capsule Take 1 capsule (500 mg total) by mouth 3 (three) times daily.   [DISCONTINUED] fluconazole (DIFLUCAN) 150 MG tablet Take 1 tablet (150 mg total) by mouth daily.   [DISCONTINUED] KERENDIA 10 MG TABS Take 1 tablet by mouth daily.   [DISCONTINUED] metoprolol tartrate (LOPRESSOR) 25 MG tablet Take 1 tablet (25 mg total) by mouth 2 (two) times daily.   [DISCONTINUED] nystatin (MYCOSTATIN/NYSTOP) powder Apply 1 Application topically 2 (two) times daily.   [DISCONTINUED] nystatin cream (MYCOSTATIN) Apply to affected area 2 times daily   [DISCONTINUED] nystatin powder Apply 1 Application topically daily as needed (irritation).   No facility-administered encounter medications on file as of 03/10/2023.    ALLERGIES: Allergies  Allergen Reactions   Aspirin Anaphylaxis and Other (See Comments)    Throat closing    Bee Venom Anaphylaxis   Pineapple Anaphylaxis   Definity [Perflutren Lipid Microsphere]     Muscle Aches   E-Mycin [Erythromycin Base] Nausea And Vomiting    VACCINATION STATUS: Immunization History  Administered Date(s) Administered   Influenza,inj,Quad PF,6+ Mos 12/25/2019, 12/23/2020   Moderna Sars-Covid-2 Vaccination 06/25/2019, 07/25/2019   Pneumococcal Polysaccharide-23 04/11/2012, 12/25/2019    Diabetes She presents for her follow-up diabetic visit. She has type 2 diabetes mellitus. Onset time: diagnosed at approx age of 80. Her disease course has been stable. There are no hypoglycemic associated symptoms. Associated symptoms include blurred vision, fatigue, foot paresthesias, polydipsia and weight loss. Pertinent negatives for diabetes include no polyuria. There are  no hypoglycemic complications. Symptoms are stable. Diabetic complications include heart disease, peripheral neuropathy and PVD. (Hx Left BKA, MI) Risk factors for coronary artery disease include diabetes mellitus, dyslipidemia, family history, obesity, hypertension and sedentary lifestyle. Current diabetic treatment includes intensive insulin program (and Ozempic). She is compliant with treatment most of the time. Her weight is decreasing steadily. She is following a generally unhealthy diet. When asked about meal planning, she reported none. She has not had a previous visit with a dietitian. She rarely participates in exercise. Her home blood glucose trend is fluctuating minimally. Her breakfast blood glucose range is generally >200 mg/dl. Her lunch blood glucose range is generally >200 mg/dl. Her dinner blood glucose range is generally >200 mg/dl. Her bedtime blood glucose range is generally >200 mg/dl. (She presents today with her CGM showing gross hyperglycemia overall.  POCT A1c today is 13.5%, increasing drastically from last A1c of 8.2%.  Analysis of her CGM shows TIR 4%, TAR 96%, TBR 0%.  She notes she has not been consistent with taking her insulin.  She has not been eating much lately and is afraid the insulin will cause her to drop too low.  She also notes she has been sick several times since last visit.) An ACE inhibitor/angiotensin II receptor blocker is being taken. She sees a podiatrist.Eye exam is current.  Hypertension This is a chronic problem. The current episode started more than 1 year ago. The problem has been gradually improving since onset. The problem is controlled. Associated symptoms include blurred vision. There are no associated agents to hypertension. Risk factors for coronary artery disease include diabetes mellitus, dyslipidemia, family history, obesity and sedentary lifestyle. Past treatments include ACE inhibitors, beta blockers and diuretics. The current treatment provides mild  improvement. Compliance problems include diet and exercise.  Hypertensive end-organ damage includes CAD/MI, heart failure and PVD.  Hyperlipidemia This is a chronic problem. The current episode started more than 1 year ago. The problem is uncontrolled. Recent lipid tests  were reviewed and are variable. Exacerbating diseases include diabetes and obesity. Factors aggravating her hyperlipidemia include beta blockers. Current antihyperlipidemic treatment includes statins. The current treatment provides mild improvement of lipids. Compliance problems include adherence to diet and adherence to exercise.  Risk factors for coronary artery disease include diabetes mellitus, dyslipidemia, family history, obesity, hypertension and a sedentary lifestyle.     Review of systems  Constitutional: + decreasing body weight, current Body mass index is 43.26 kg/m., + fatigue, no subjective hyperthermia, no subjective hypothermia Eyes: + blurry vision, no xerophthalmia ENT: no sore throat, no nodules palpated in throat, no dysphagia/odynophagia, no hoarseness Cardiovascular: no chest pain, no shortness of breath, no palpitations Respiratory: no cough, + shortness of breath- hx COPD on chronic O2 (not wearing today) Gastrointestinal: no nausea/vomiting/diarrhea Musculoskeletal: no muscle/joint aches, WC bound due to hx of L BKA- has prosthesis Skin: no rashes, no hyperemia Neurological: no tremors, no numbness, no tingling, no dizziness Psychiatric: + depression, no anxiety  Objective:     BP 136/76 (BP Location: Left Arm, Patient Position: Sitting, Cuff Size: Large)   Pulse 70   Ht 5\' 4"  (1.626 m)   Wt 252 lb (114.3 kg)   BMI 43.26 kg/m   Wt Readings from Last 3 Encounters:  03/10/23 252 lb (114.3 kg)  11/18/22 275 lb (124.7 kg)  08/17/22 281 lb 6.4 oz (127.6 kg)     BP Readings from Last 3 Encounters:  03/10/23 136/76  02/04/23 (!) 152/83  11/18/22 128/63     Physical Exam-  Limited  Constitutional:  Body mass index is 43.26 kg/m. , not in acute distress, normal state of mind, got tearful during appt Eyes:  EOMI, no exophthalmos Musculoskeletal: WC bound- hx L BKA with prosthesis Skin:  no rashes, no hyperemia Neurological: no tremor with outstretched hands   Diabetic Foot Exam - Simple   No data filed    CMP ( most recent) CMP     Component Value Date/Time   NA 136 01/11/2022 1441   K 3.8 01/11/2022 1441   CL 98 01/11/2022 1441   CO2 26 01/11/2022 1441   GLUCOSE 423 (H) 01/11/2022 1441   BUN 34 (H) 01/11/2022 1441   CREATININE 1.40 (H) 01/11/2022 1441   CREATININE 0.86 07/30/2020 1558   CALCIUM 9.2 01/11/2022 1441   PROT 6.9 12/08/2021 0913   ALBUMIN 3.2 (L) 12/08/2021 0913   AST 26 12/08/2021 0913   ALT 19 12/08/2021 0913   ALKPHOS 79 12/08/2021 0913   BILITOT 0.4 12/08/2021 0913   GFRNONAA 44 (L) 01/11/2022 1441   GFRNONAA 76 07/30/2020 1558   GFRAA 88 07/30/2020 1558     Diabetic Labs (most recent): Lab Results  Component Value Date   HGBA1C 8.1 (A) 08/17/2022   HGBA1C 9.5 (H) 12/08/2021   HGBA1C 14.3 09/29/2021     Lipid Panel ( most recent) Lipid Panel     Component Value Date/Time   CHOL 109 12/15/2021 0551   TRIG 304 (H) 12/15/2021 0551   HDL 25 (L) 12/15/2021 0551   CHOLHDL 4.4 12/15/2021 0551   VLDL 61 (H) 12/15/2021 0551   LDLCALC 23 12/15/2021 0551   LDLCALC 65 12/25/2019 1359      Lab Results  Component Value Date   TSH 2.06 12/25/2019   TSH 2.800 04/11/2012   TSH 4.814 (H) 11/10/2011   TSH 2.672 02/19/2011   FREET4 1.2 12/25/2019           Assessment & Plan:   1) Type  2 diabetes mellitus with hyperglycemia, with long-term current use of insulin (HCC)  She presents today with her CGM showing gross hyperglycemia overall.  POCT A1c today is 13.5%, increasing drastically from last A1c of 8.2%.  Analysis of her CGM shows TIR 4%, TAR 96%, TBR 0%.  She notes she has not been consistent with taking her  insulin.  She has not been eating much lately and is afraid the insulin will cause her to drop too low.  She also notes she has been sick several times since last visit.  - Nicole [name] has currently uncontrolled symptomatic type 2 DM since 58 years of age.   -Recent labs reviewed.  - I had a long discussion with her about the progressive nature of diabetes and the pathology behind its complications. -her diabetes is complicated by PVD- L BKA, MI, CAD, CHF and she remains at a high risk for more acute and chronic complications which include CAD, CVA, CKD, retinopathy, and neuropathy. These are all discussed in detail with her.  - Nutritional counseling repeated at each appointment due to patients tendency to fall back in to old habits.  - The patient admits there is a room for improvement in their diet and drink choices. -  Suggestion is made for the patient to avoid simple carbohydrates from their diet including Cakes, Sweet Desserts / Pastries, Ice Cream, Soda (diet and regular), Sweet Tea, Candies, Chips, Cookies, Sweet Pastries, Store Bought Juices, Alcohol in Excess of 1-2 drinks a day, Artificial Sweeteners, Coffee Creamer, and "Sugar-free" Products. This will help patient to have stable blood glucose profile and potentially avoid unintended weight gain.   - I encouraged the patient to switch to unprocessed or minimally processed complex starch and increased protein intake (animal or plant source), fruits, and vegetables.   - Patient is advised to stick to a routine mealtimes to eat 3 meals a day and avoid unnecessary snacks (to snack only to correct hypoglycemia).  - she will be scheduled with Norm Salt, RDN, CDE for diabetes education.  - I have approached her with the following individualized plan to manage her diabetes and patient agrees:   -She is advised to decrease her U500 to 70 units TID with meals if glucose is above 90 and she is eating (and to be consistent with taking  it) and continue Ozempic 2 mg SQ weekly.  She was taken off Farxiga at last visit and yeast in abdominal folds has cleared up since.  Will avoid SGLT2i in the future.    -she is encouraged to continue monitoring glucose 4 times daily, before meals and before bed (using her CGM), and to call the clinic if she has readings less than 70 or above 300 for 3 tests in a row.  I asked her or her daughter call with an update on glucose readings in 2 weeks so we can adjust insulin if needed.  - she is warned not to take insulin without proper monitoring per orders. - Adjustment parameters are given to her for hypo and hyperglycemia in writing.  - Specific targets for  A1c; LDL, HDL, and Triglycerides were discussed with the patient.  2) Blood Pressure /Hypertension:  her blood pressure is controlled to target.   she is advised to continue her current medications including Lasix 80 mg po daily, Lisinopril 2.5 mg p.o. daily with breakfast, and Metoprolol 50 mg po twice daily.  3) Lipids/Hyperlipidemia:    Review of her recent lipid panel from 03/04/22 showed uncontrolled  LDL at 1781 and elevated triglycerides of 544 (worsening).  she is advised to continue Crestor 40 mg daily at bedtime.  Side effects and precautions discussed with her.  She is also advised to avoid fried foods and butter.  She is getting labs for her PCP today.  4)  Weight/Diet:  her Body mass index is 43.26 kg/m.  -  clearly complicating her diabetes care.   she is a candidate for weight loss. I discussed with her the fact that loss of 5 - 10% of her  current body weight will have the most impact on her diabetes management.  Exercise, and detailed carbohydrates information provided  -  detailed on discharge instructions.  5) Chronic Care/Health Maintenance: -she is on ACEI/ARB and Statin medications and is encouraged to initiate and continue to follow up with Ophthalmology, Dentist, Podiatrist at least yearly or according to  recommendations, and advised to stay away from smoking. I have recommended yearly flu vaccine and pneumonia vaccine at least every 5 years; moderate intensity exercise for up to 150 minutes weekly; and sleep for at least 7 hours a day.  - she is advised to maintain close follow up with Benita Stabile, MD for primary care needs, as well as her other providers for optimal and coordinated care.      I spent  32  minutes in the care of the patient today including review of labs from CMP, Lipids, Thyroid Function, Hematology (current and previous including abstractions from other facilities); face-to-face time discussing  her blood glucose readings/logs, discussing hypoglycemia and hyperglycemia episodes and symptoms, medications doses, her options of short and long term treatment based on the latest standards of care / guidelines;  discussion about incorporating lifestyle medicine;  and documenting the encounter. Risk reduction counseling performed per USPSTF guidelines to reduce obesity and cardiovascular risk factors.     Please refer to Patient Instructions for Blood Glucose Monitoring and Insulin/Medications Dosing Guide"  in media tab for additional information. Please  also refer to " Patient Self Inventory" in the Media  tab for reviewed elements of pertinent patient history.  Nicole [name] participated in the discussions, expressed understanding, and voiced agreement with the above plans.  All questions were answered to her satisfaction. she is encouraged to contact clinic should she have any questions or concerns prior to her return visit.     Follow up plan: - Return in about 3 months (around 06/08/2023) for Diabetes F/U with A1c in office, No previsit labs, Bring meter and logs.   Ronny Bacon, [facility] [address] [place], [place] [address] Phone: [phone] Fax: [phone]  03/10/2023, 10:04 AM

## 2023-03-10 NOTE — Addendum Note (Signed)
Addended by: Shona Needles on: 03/10/2023 10:38 AM   Modules accepted: Orders

## 2023-03-15 DIAGNOSIS — E1165 Type 2 diabetes mellitus with hyperglycemia: Secondary | ICD-10-CM | POA: Diagnosis not present

## 2023-03-15 DIAGNOSIS — K219 Gastro-esophageal reflux disease without esophagitis: Secondary | ICD-10-CM | POA: Diagnosis not present

## 2023-03-15 DIAGNOSIS — E1169 Type 2 diabetes mellitus with other specified complication: Secondary | ICD-10-CM | POA: Diagnosis not present

## 2023-03-15 DIAGNOSIS — R809 Proteinuria, unspecified: Secondary | ICD-10-CM | POA: Diagnosis not present

## 2023-03-15 DIAGNOSIS — J449 Chronic obstructive pulmonary disease, unspecified: Secondary | ICD-10-CM | POA: Diagnosis not present

## 2023-03-15 DIAGNOSIS — I130 Hypertensive heart and chronic kidney disease with heart failure and stage 1 through stage 4 chronic kidney disease, or unspecified chronic kidney disease: Secondary | ICD-10-CM | POA: Diagnosis not present

## 2023-03-15 DIAGNOSIS — I25119 Atherosclerotic heart disease of native coronary artery with unspecified angina pectoris: Secondary | ICD-10-CM | POA: Diagnosis not present

## 2023-03-15 DIAGNOSIS — I4891 Unspecified atrial fibrillation: Secondary | ICD-10-CM | POA: Diagnosis not present

## 2023-03-15 DIAGNOSIS — E785 Hyperlipidemia, unspecified: Secondary | ICD-10-CM | POA: Diagnosis not present

## 2023-03-15 DIAGNOSIS — I509 Heart failure, unspecified: Secondary | ICD-10-CM | POA: Diagnosis not present

## 2023-03-15 DIAGNOSIS — E1122 Type 2 diabetes mellitus with diabetic chronic kidney disease: Secondary | ICD-10-CM | POA: Diagnosis not present

## 2023-03-22 DIAGNOSIS — J441 Chronic obstructive pulmonary disease with (acute) exacerbation: Secondary | ICD-10-CM | POA: Diagnosis not present

## 2023-04-01 ENCOUNTER — Other Ambulatory Visit: Payer: Self-pay | Admitting: Physician Assistant

## 2023-04-07 ENCOUNTER — Other Ambulatory Visit: Payer: Self-pay | Admitting: Internal Medicine

## 2023-04-10 DIAGNOSIS — E1165 Type 2 diabetes mellitus with hyperglycemia: Secondary | ICD-10-CM | POA: Diagnosis not present

## 2023-04-21 NOTE — Progress Notes (Deleted)
 Cardiology Office Note   Date:  04/21/2023   ID:  Nicole, [name] [birth year], MRN [id number]  PCP:  Benita Stabile, MD  Cardiologist:   [name], MD   Patient presents for follow up of CAD    History of Present Illness: Nicole [name] is a 58 y.o. female with a history of HTN, Type II DM (x 30 years), COPD, RBBB, HL, obesity and CAD.  S/P PTCA stent 2009 ([facility]) Echo in 2013 LVEF normal  Myoview in May 2013 was normal    Pt moved away  to [place]    While there had 2 more stents placed [employer])   She says she was also noted to have an irregular herart beat that was treated with meds    I saw the pt in clinic in 2022    In Jun 2023 she had a L heart cath that showed patient stents to RCA and LAD      Since seen she her breathing is getting better   Still using O2 Denies CP  Denies signficant palpitatations  Ran out of Crestor   Back on it now     Due to have labs in April Endocrine helping with sugars  I saw the pt in Feb 2024  No outpatient medications have been marked as taking for the 04/27/23 encounter (Appointment) with Pricilla Riffle, MD.     Allergies:   Aspirin, Bee venom, Pineapple, Definity [perflutren lipid microsphere], and E-mycin [erythromycin base]   Past Medical History:  Diagnosis Date   CAP (community acquired pneumonia)    Streptococcus 01/2011   COPD (chronic obstructive pulmonary disease) (HCC)    Coronary atherosclerosis of native coronary artery    a. Diagnosed [place] 2006 - DES RCA, reports followup cath 2009 at [facility] b. reported 2 stents in [place] in 2020. c. 07/2021: cath showing patent stents along RCA and mid-LAD with scattered 20% stenosis but no obstructive disease.   Dyslipidemia    Essential hypertension, benign    Morbid obesity (HCC)    Pancreatitis    Type 2 diabetes mellitus (HCC)     Past Surgical History:  Procedure Laterality Date   ABDOMINAL HERNIA REPAIR     ABDOMINAL HYSTERECTOMY     AMPUTATION Left  01/25/2020   Procedure: LEFT BELOW KNEE AMPUTATION;  Surgeon: [name], MD;  Location: [facility];  Service: Orthopedics;  Laterality: Left;   CORONARY ANGIOPLASTY WITH STENT PLACEMENT  2006   KNEE SURGERY     LEFT HEART CATH AND CORONARY ANGIOGRAPHY N/A 08/14/2021   Procedure: LEFT HEART CATH AND CORONARY ANGIOGRAPHY;  Surgeon: Lennette Bihari, MD;  Location: [facility] INVASIVE CV LAB;  Service: Cardiovascular;  Laterality: N/A;   NOSE SURGERY     Pilonidal cystectomy     TONSILLECTOMY       Social History:  The patient  reports that she quit smoking about 3 years ago. Her smoking use included cigarettes. She started smoking about 23 years ago. She has a 10 pack-year smoking history. She has never used smokeless tobacco. She reports that she does not drink alcohol and does not use drugs.   Family History:  The patient's family history includes Asthma in her son; Diabetes in her mother; Heart failure in her mother; Hypertension in her mother; Kidney failure in her mother; Ovarian cancer in her mother.    ROS:  Please see the history of present illness. All other systems are reviewed and  Negative to the above problem except as noted.    PHYSICAL EXAM: VS:  There were no vitals taken for this visit.  GEN: Morbidly obese 58 yo  in no acute distress  HEENT: normal  Neck: no JVD, carotid bruits, Cardiac: RRR; no murmurs,  Tr LE edema  Respiratory:  clear to auscultation bilaterally   GI: soft, nontender, nondistended, + BS  No hepatomegaly  MS: no deformity Moving all extremities   Skin: warm and dry, no rash Neuro:  Strength and sensation are intact Psych: euthymic mood, full affect   EKG:  EKG is not ordered today.   Lipid Panel    Component Value Date/Time   CHOL 109 12/15/2021 0551   TRIG 304 (H) 12/15/2021 0551   HDL 25 (L) 12/15/2021 0551   CHOLHDL 4.4 12/15/2021 0551   VLDL 61 (H) 12/15/2021 0551   LDLCALC 23 12/15/2021 0551   LDLCALC 65 12/25/2019 1359      Wt Readings  from Last 3 Encounters:  03/10/23 252 lb (114.3 kg)  11/18/22 275 lb (124.7 kg)  08/17/22 281 lb 6.4 oz (127.6 kg)      ASSESSMENT AND PLAN:  1   CAD  Pt with remote interventions.   Cth in fall 2023 showed patent stents    PT without CP   Follow   Keep on Plavix  Not on ASA   3   Arrhythmia  PT had been on eliquis   No documented arrhythmias here   Follo       4  HL  Last LDL 178   She has just restarted statin   WIll need to follow    5  HTN  BP is fair  Follow  6 DM  Discussed diet A1C 10.9   7   Morbid obesity   Discussed diet sum  Mediterranian diet        Current medicines are reviewed at length with the patient today.  The patient does not have concerns regarding medicines.  Signed, [name], MD  04/21/2023 10:47 PM    [facility] [address] [place], [place], [place]  [address] Phone: [phone]; Fax: [phone]

## 2023-04-26 DIAGNOSIS — N182 Chronic kidney disease, stage 2 (mild): Secondary | ICD-10-CM | POA: Diagnosis not present

## 2023-04-26 DIAGNOSIS — I129 Hypertensive chronic kidney disease with stage 1 through stage 4 chronic kidney disease, or unspecified chronic kidney disease: Secondary | ICD-10-CM | POA: Diagnosis not present

## 2023-04-26 DIAGNOSIS — E1122 Type 2 diabetes mellitus with diabetic chronic kidney disease: Secondary | ICD-10-CM | POA: Diagnosis not present

## 2023-04-26 DIAGNOSIS — I5032 Chronic diastolic (congestive) heart failure: Secondary | ICD-10-CM | POA: Diagnosis not present

## 2023-04-27 ENCOUNTER — Ambulatory Visit: Payer: 59 | Admitting: Internal Medicine

## 2023-05-02 ENCOUNTER — Other Ambulatory Visit (HOSPITAL_COMMUNITY): Payer: Self-pay | Admitting: Nephrology

## 2023-05-02 DIAGNOSIS — N189 Chronic kidney disease, unspecified: Secondary | ICD-10-CM

## 2023-05-08 DIAGNOSIS — E1165 Type 2 diabetes mellitus with hyperglycemia: Secondary | ICD-10-CM | POA: Diagnosis not present

## 2023-05-31 ENCOUNTER — Ambulatory Visit: Admitting: Urology

## 2023-06-08 DIAGNOSIS — E1165 Type 2 diabetes mellitus with hyperglycemia: Secondary | ICD-10-CM | POA: Diagnosis not present

## 2023-06-14 ENCOUNTER — Ambulatory Visit: Payer: 59 | Admitting: Nurse Practitioner

## 2023-06-23 ENCOUNTER — Ambulatory Visit: Admitting: Student

## 2023-07-01 ENCOUNTER — Ambulatory Visit: Admitting: Urology

## 2023-07-04 ENCOUNTER — Encounter (HOSPITAL_COMMUNITY): Payer: Self-pay

## 2023-07-04 ENCOUNTER — Ambulatory Visit (HOSPITAL_COMMUNITY): Attending: Nephrology

## 2023-07-08 ENCOUNTER — Other Ambulatory Visit: Payer: Self-pay | Admitting: Internal Medicine

## 2023-07-08 DIAGNOSIS — E1165 Type 2 diabetes mellitus with hyperglycemia: Secondary | ICD-10-CM | POA: Diagnosis not present

## 2023-07-27 DIAGNOSIS — E1122 Type 2 diabetes mellitus with diabetic chronic kidney disease: Secondary | ICD-10-CM | POA: Diagnosis not present

## 2023-07-27 DIAGNOSIS — E785 Hyperlipidemia, unspecified: Secondary | ICD-10-CM | POA: Diagnosis not present

## 2023-07-27 DIAGNOSIS — E1165 Type 2 diabetes mellitus with hyperglycemia: Secondary | ICD-10-CM | POA: Diagnosis not present

## 2023-07-27 DIAGNOSIS — L89220 Pressure ulcer of left hip, unstageable: Secondary | ICD-10-CM | POA: Diagnosis not present

## 2023-07-28 LAB — LAB REPORT - SCANNED
A1c: 15.1
Albumin, Urine POC: 165.4
Creatinine, POC: 40 mg/dL
EGFR: 70
Microalb Creat Ratio: 414

## 2023-08-08 DIAGNOSIS — I130 Hypertensive heart and chronic kidney disease with heart failure and stage 1 through stage 4 chronic kidney disease, or unspecified chronic kidney disease: Secondary | ICD-10-CM | POA: Diagnosis not present

## 2023-08-08 DIAGNOSIS — E02 Subclinical iodine-deficiency hypothyroidism: Secondary | ICD-10-CM | POA: Diagnosis not present

## 2023-08-08 DIAGNOSIS — I872 Venous insufficiency (chronic) (peripheral): Secondary | ICD-10-CM | POA: Diagnosis not present

## 2023-08-08 DIAGNOSIS — E559 Vitamin D deficiency, unspecified: Secondary | ICD-10-CM | POA: Diagnosis not present

## 2023-08-08 DIAGNOSIS — Z7985 Long-term (current) use of injectable non-insulin antidiabetic drugs: Secondary | ICD-10-CM | POA: Diagnosis not present

## 2023-08-08 DIAGNOSIS — J449 Chronic obstructive pulmonary disease, unspecified: Secondary | ICD-10-CM | POA: Diagnosis not present

## 2023-08-08 DIAGNOSIS — M1990 Unspecified osteoarthritis, unspecified site: Secondary | ICD-10-CM | POA: Diagnosis not present

## 2023-08-08 DIAGNOSIS — E1122 Type 2 diabetes mellitus with diabetic chronic kidney disease: Secondary | ICD-10-CM | POA: Diagnosis not present

## 2023-08-08 DIAGNOSIS — N182 Chronic kidney disease, stage 2 (mild): Secondary | ICD-10-CM | POA: Diagnosis not present

## 2023-08-08 DIAGNOSIS — I2510 Atherosclerotic heart disease of native coronary artery without angina pectoris: Secondary | ICD-10-CM | POA: Diagnosis not present

## 2023-08-08 DIAGNOSIS — I509 Heart failure, unspecified: Secondary | ICD-10-CM | POA: Diagnosis not present

## 2023-08-08 DIAGNOSIS — E1165 Type 2 diabetes mellitus with hyperglycemia: Secondary | ICD-10-CM | POA: Diagnosis not present

## 2023-08-08 DIAGNOSIS — Z556 Problems related to health literacy: Secondary | ICD-10-CM | POA: Diagnosis not present

## 2023-08-08 DIAGNOSIS — I4891 Unspecified atrial fibrillation: Secondary | ICD-10-CM | POA: Diagnosis not present

## 2023-08-08 DIAGNOSIS — Z87891 Personal history of nicotine dependence: Secondary | ICD-10-CM | POA: Diagnosis not present

## 2023-08-08 DIAGNOSIS — G250 Essential tremor: Secondary | ICD-10-CM | POA: Diagnosis not present

## 2023-08-08 DIAGNOSIS — Z7984 Long term (current) use of oral hypoglycemic drugs: Secondary | ICD-10-CM | POA: Diagnosis not present

## 2023-08-08 DIAGNOSIS — L89220 Pressure ulcer of left hip, unstageable: Secondary | ICD-10-CM | POA: Diagnosis not present

## 2023-08-08 DIAGNOSIS — Z7902 Long term (current) use of antithrombotics/antiplatelets: Secondary | ICD-10-CM | POA: Diagnosis not present

## 2023-08-08 DIAGNOSIS — Z5982 Transportation insecurity: Secondary | ICD-10-CM | POA: Diagnosis not present

## 2023-08-08 DIAGNOSIS — H6123 Impacted cerumen, bilateral: Secondary | ICD-10-CM | POA: Diagnosis not present

## 2023-08-11 DIAGNOSIS — Z7902 Long term (current) use of antithrombotics/antiplatelets: Secondary | ICD-10-CM | POA: Diagnosis not present

## 2023-08-11 DIAGNOSIS — I4891 Unspecified atrial fibrillation: Secondary | ICD-10-CM | POA: Diagnosis not present

## 2023-08-11 DIAGNOSIS — E02 Subclinical iodine-deficiency hypothyroidism: Secondary | ICD-10-CM | POA: Diagnosis not present

## 2023-08-11 DIAGNOSIS — H6123 Impacted cerumen, bilateral: Secondary | ICD-10-CM | POA: Diagnosis not present

## 2023-08-11 DIAGNOSIS — Z7985 Long-term (current) use of injectable non-insulin antidiabetic drugs: Secondary | ICD-10-CM | POA: Diagnosis not present

## 2023-08-11 DIAGNOSIS — M1990 Unspecified osteoarthritis, unspecified site: Secondary | ICD-10-CM | POA: Diagnosis not present

## 2023-08-11 DIAGNOSIS — Z7984 Long term (current) use of oral hypoglycemic drugs: Secondary | ICD-10-CM | POA: Diagnosis not present

## 2023-08-11 DIAGNOSIS — L89220 Pressure ulcer of left hip, unstageable: Secondary | ICD-10-CM | POA: Diagnosis not present

## 2023-08-11 DIAGNOSIS — E559 Vitamin D deficiency, unspecified: Secondary | ICD-10-CM | POA: Diagnosis not present

## 2023-08-11 DIAGNOSIS — G250 Essential tremor: Secondary | ICD-10-CM | POA: Diagnosis not present

## 2023-08-11 DIAGNOSIS — Z556 Problems related to health literacy: Secondary | ICD-10-CM | POA: Diagnosis not present

## 2023-08-11 DIAGNOSIS — Z87891 Personal history of nicotine dependence: Secondary | ICD-10-CM | POA: Diagnosis not present

## 2023-08-11 DIAGNOSIS — I872 Venous insufficiency (chronic) (peripheral): Secondary | ICD-10-CM | POA: Diagnosis not present

## 2023-08-11 DIAGNOSIS — I130 Hypertensive heart and chronic kidney disease with heart failure and stage 1 through stage 4 chronic kidney disease, or unspecified chronic kidney disease: Secondary | ICD-10-CM | POA: Diagnosis not present

## 2023-08-11 DIAGNOSIS — N182 Chronic kidney disease, stage 2 (mild): Secondary | ICD-10-CM | POA: Diagnosis not present

## 2023-08-11 DIAGNOSIS — I2510 Atherosclerotic heart disease of native coronary artery without angina pectoris: Secondary | ICD-10-CM | POA: Diagnosis not present

## 2023-08-11 DIAGNOSIS — E1122 Type 2 diabetes mellitus with diabetic chronic kidney disease: Secondary | ICD-10-CM | POA: Diagnosis not present

## 2023-08-11 DIAGNOSIS — Z5982 Transportation insecurity: Secondary | ICD-10-CM | POA: Diagnosis not present

## 2023-08-11 DIAGNOSIS — I509 Heart failure, unspecified: Secondary | ICD-10-CM | POA: Diagnosis not present

## 2023-08-11 DIAGNOSIS — J449 Chronic obstructive pulmonary disease, unspecified: Secondary | ICD-10-CM | POA: Diagnosis not present

## 2023-08-12 DIAGNOSIS — L89220 Pressure ulcer of left hip, unstageable: Secondary | ICD-10-CM | POA: Diagnosis not present

## 2023-08-15 DIAGNOSIS — Z5982 Transportation insecurity: Secondary | ICD-10-CM | POA: Diagnosis not present

## 2023-08-15 DIAGNOSIS — Z556 Problems related to health literacy: Secondary | ICD-10-CM | POA: Diagnosis not present

## 2023-08-15 DIAGNOSIS — Z7984 Long term (current) use of oral hypoglycemic drugs: Secondary | ICD-10-CM | POA: Diagnosis not present

## 2023-08-15 DIAGNOSIS — E02 Subclinical iodine-deficiency hypothyroidism: Secondary | ICD-10-CM | POA: Diagnosis not present

## 2023-08-15 DIAGNOSIS — J449 Chronic obstructive pulmonary disease, unspecified: Secondary | ICD-10-CM | POA: Diagnosis not present

## 2023-08-15 DIAGNOSIS — I130 Hypertensive heart and chronic kidney disease with heart failure and stage 1 through stage 4 chronic kidney disease, or unspecified chronic kidney disease: Secondary | ICD-10-CM | POA: Diagnosis not present

## 2023-08-15 DIAGNOSIS — Z87891 Personal history of nicotine dependence: Secondary | ICD-10-CM | POA: Diagnosis not present

## 2023-08-15 DIAGNOSIS — L89220 Pressure ulcer of left hip, unstageable: Secondary | ICD-10-CM | POA: Diagnosis not present

## 2023-08-15 DIAGNOSIS — G250 Essential tremor: Secondary | ICD-10-CM | POA: Diagnosis not present

## 2023-08-15 DIAGNOSIS — I509 Heart failure, unspecified: Secondary | ICD-10-CM | POA: Diagnosis not present

## 2023-08-15 DIAGNOSIS — H6123 Impacted cerumen, bilateral: Secondary | ICD-10-CM | POA: Diagnosis not present

## 2023-08-15 DIAGNOSIS — E1122 Type 2 diabetes mellitus with diabetic chronic kidney disease: Secondary | ICD-10-CM | POA: Diagnosis not present

## 2023-08-15 DIAGNOSIS — Z7902 Long term (current) use of antithrombotics/antiplatelets: Secondary | ICD-10-CM | POA: Diagnosis not present

## 2023-08-15 DIAGNOSIS — Z7985 Long-term (current) use of injectable non-insulin antidiabetic drugs: Secondary | ICD-10-CM | POA: Diagnosis not present

## 2023-08-15 DIAGNOSIS — M1990 Unspecified osteoarthritis, unspecified site: Secondary | ICD-10-CM | POA: Diagnosis not present

## 2023-08-15 DIAGNOSIS — I872 Venous insufficiency (chronic) (peripheral): Secondary | ICD-10-CM | POA: Diagnosis not present

## 2023-08-15 DIAGNOSIS — N182 Chronic kidney disease, stage 2 (mild): Secondary | ICD-10-CM | POA: Diagnosis not present

## 2023-08-15 DIAGNOSIS — E559 Vitamin D deficiency, unspecified: Secondary | ICD-10-CM | POA: Diagnosis not present

## 2023-08-15 DIAGNOSIS — I2510 Atherosclerotic heart disease of native coronary artery without angina pectoris: Secondary | ICD-10-CM | POA: Diagnosis not present

## 2023-08-15 DIAGNOSIS — I4891 Unspecified atrial fibrillation: Secondary | ICD-10-CM | POA: Diagnosis not present

## 2023-08-17 ENCOUNTER — Other Ambulatory Visit: Payer: Self-pay | Admitting: Internal Medicine

## 2023-08-18 DIAGNOSIS — E559 Vitamin D deficiency, unspecified: Secondary | ICD-10-CM | POA: Diagnosis not present

## 2023-08-18 DIAGNOSIS — I872 Venous insufficiency (chronic) (peripheral): Secondary | ICD-10-CM | POA: Diagnosis not present

## 2023-08-18 DIAGNOSIS — Z7902 Long term (current) use of antithrombotics/antiplatelets: Secondary | ICD-10-CM | POA: Diagnosis not present

## 2023-08-18 DIAGNOSIS — N182 Chronic kidney disease, stage 2 (mild): Secondary | ICD-10-CM | POA: Diagnosis not present

## 2023-08-18 DIAGNOSIS — G250 Essential tremor: Secondary | ICD-10-CM | POA: Diagnosis not present

## 2023-08-18 DIAGNOSIS — E02 Subclinical iodine-deficiency hypothyroidism: Secondary | ICD-10-CM | POA: Diagnosis not present

## 2023-08-18 DIAGNOSIS — H6123 Impacted cerumen, bilateral: Secondary | ICD-10-CM | POA: Diagnosis not present

## 2023-08-18 DIAGNOSIS — J449 Chronic obstructive pulmonary disease, unspecified: Secondary | ICD-10-CM | POA: Diagnosis not present

## 2023-08-18 DIAGNOSIS — Z556 Problems related to health literacy: Secondary | ICD-10-CM | POA: Diagnosis not present

## 2023-08-18 DIAGNOSIS — I4891 Unspecified atrial fibrillation: Secondary | ICD-10-CM | POA: Diagnosis not present

## 2023-08-18 DIAGNOSIS — Z7985 Long-term (current) use of injectable non-insulin antidiabetic drugs: Secondary | ICD-10-CM | POA: Diagnosis not present

## 2023-08-18 DIAGNOSIS — I2510 Atherosclerotic heart disease of native coronary artery without angina pectoris: Secondary | ICD-10-CM | POA: Diagnosis not present

## 2023-08-18 DIAGNOSIS — M1990 Unspecified osteoarthritis, unspecified site: Secondary | ICD-10-CM | POA: Diagnosis not present

## 2023-08-18 DIAGNOSIS — E1122 Type 2 diabetes mellitus with diabetic chronic kidney disease: Secondary | ICD-10-CM | POA: Diagnosis not present

## 2023-08-18 DIAGNOSIS — Z7984 Long term (current) use of oral hypoglycemic drugs: Secondary | ICD-10-CM | POA: Diagnosis not present

## 2023-08-18 DIAGNOSIS — Z87891 Personal history of nicotine dependence: Secondary | ICD-10-CM | POA: Diagnosis not present

## 2023-08-18 DIAGNOSIS — I130 Hypertensive heart and chronic kidney disease with heart failure and stage 1 through stage 4 chronic kidney disease, or unspecified chronic kidney disease: Secondary | ICD-10-CM | POA: Diagnosis not present

## 2023-08-18 DIAGNOSIS — I509 Heart failure, unspecified: Secondary | ICD-10-CM | POA: Diagnosis not present

## 2023-08-18 DIAGNOSIS — Z5982 Transportation insecurity: Secondary | ICD-10-CM | POA: Diagnosis not present

## 2023-08-18 DIAGNOSIS — L89220 Pressure ulcer of left hip, unstageable: Secondary | ICD-10-CM | POA: Diagnosis not present

## 2023-08-19 ENCOUNTER — Emergency Department (HOSPITAL_COMMUNITY)
Admission: EM | Admit: 2023-08-19 | Discharge: 2023-08-20 | Disposition: A | Discharge status: Home / Self Care | Attending: Emergency Medicine | Admitting: Emergency Medicine

## 2023-08-19 ENCOUNTER — Encounter (HOSPITAL_COMMUNITY): Payer: Self-pay | Admitting: Emergency Medicine

## 2023-08-19 ENCOUNTER — Other Ambulatory Visit: Payer: Self-pay

## 2023-08-19 DIAGNOSIS — M160 Bilateral primary osteoarthritis of hip: Secondary | ICD-10-CM | POA: Diagnosis not present

## 2023-08-19 DIAGNOSIS — L89229 Pressure ulcer of left hip, unspecified stage: Secondary | ICD-10-CM | POA: Diagnosis not present

## 2023-08-19 DIAGNOSIS — I2510 Atherosclerotic heart disease of native coronary artery without angina pectoris: Secondary | ICD-10-CM | POA: Insufficient documentation

## 2023-08-19 DIAGNOSIS — Z79899 Other long term (current) drug therapy: Secondary | ICD-10-CM | POA: Insufficient documentation

## 2023-08-19 DIAGNOSIS — I509 Heart failure, unspecified: Secondary | ICD-10-CM | POA: Diagnosis not present

## 2023-08-19 DIAGNOSIS — Z794 Long term (current) use of insulin: Secondary | ICD-10-CM | POA: Diagnosis not present

## 2023-08-19 DIAGNOSIS — I110 Hypertensive heart disease with heart failure: Secondary | ICD-10-CM | POA: Insufficient documentation

## 2023-08-19 DIAGNOSIS — M47816 Spondylosis without myelopathy or radiculopathy, lumbar region: Secondary | ICD-10-CM | POA: Diagnosis not present

## 2023-08-19 DIAGNOSIS — E119 Type 2 diabetes mellitus without complications: Secondary | ICD-10-CM | POA: Insufficient documentation

## 2023-08-19 DIAGNOSIS — Z7902 Long term (current) use of antithrombotics/antiplatelets: Secondary | ICD-10-CM | POA: Insufficient documentation

## 2023-08-19 DIAGNOSIS — J449 Chronic obstructive pulmonary disease, unspecified: Secondary | ICD-10-CM | POA: Diagnosis not present

## 2023-08-19 DIAGNOSIS — M25552 Pain in left hip: Secondary | ICD-10-CM | POA: Diagnosis not present

## 2023-08-19 DIAGNOSIS — L89220 Pressure ulcer of left hip, unstageable: Secondary | ICD-10-CM | POA: Diagnosis not present

## 2023-08-19 LAB — CBC WITH DIFFERENTIAL/PLATELET
Abs Immature Granulocytes: 0.02 10*3/uL (ref 0.00–0.07)
Basophils Absolute: 0.1 10*3/uL (ref 0.0–0.1)
Basophils Relative: 1 %
Eosinophils Absolute: 0.2 10*3/uL (ref 0.0–0.5)
Eosinophils Relative: 2 %
HCT: 38.3 % (ref 36.0–46.0)
Hemoglobin: 12.5 g/dL (ref 12.0–15.0)
Immature Granulocytes: 0 %
Lymphocytes Relative: 41 %
Lymphs Abs: 3.3 10*3/uL (ref 0.7–4.0)
MCH: 30.6 pg (ref 26.0–34.0)
MCHC: 32.6 g/dL (ref 30.0–36.0)
MCV: 93.9 fL (ref 80.0–100.0)
Monocytes Absolute: 0.5 10*3/uL (ref 0.1–1.0)
Monocytes Relative: 7 %
Neutro Abs: 3.9 10*3/uL (ref 1.7–7.7)
Neutrophils Relative %: 49 %
Platelets: 161 10*3/uL (ref 150–400)
RBC: 4.08 MIL/uL (ref 3.87–5.11)
RDW: 13.6 % (ref 11.5–15.5)
WBC: 8 10*3/uL (ref 4.0–10.5)
nRBC: 0 % (ref 0.0–0.2)

## 2023-08-19 LAB — COMPREHENSIVE METABOLIC PANEL WITH GFR
ALT: 13 U/L (ref 0–44)
AST: 20 U/L (ref 15–41)
Albumin: 3.5 g/dL (ref 3.5–5.0)
Alkaline Phosphatase: 65 U/L (ref 38–126)
Anion gap: 10 (ref 5–15)
BUN: 18 mg/dL (ref 6–20)
CO2: 27 mmol/L (ref 22–32)
Calcium: 9.5 mg/dL (ref 8.9–10.3)
Chloride: 102 mmol/L (ref 98–111)
Creatinine, Ser: 0.87 mg/dL (ref 0.44–1.00)
GFR, Estimated: >60 mL/min (ref >60)
Glucose, Bld: 153 mg/dL — ABNORMAL HIGH (ref 70–99)
Potassium: 3.6 mmol/L (ref 3.5–5.1)
Sodium: 139 mmol/L (ref 135–145)
Total Bilirubin: 0.8 mg/dL (ref 0.0–1.2)
Total Protein: 6.9 g/dL (ref 6.5–8.1)

## 2023-08-19 MED ORDER — MORPHINE SULFATE (PF) 4 MG/ML IV SOLN
4.0000 mg | Freq: Once | INTRAVENOUS | Status: AC
  Administered 2023-08-20: 4 mg via INTRAVENOUS
  Filled 2023-08-19: qty 1

## 2023-08-19 MED ORDER — SODIUM CHLORIDE 0.9 % IV SOLN
3.0000 g | Freq: Once | INTRAVENOUS | Status: AC
  Administered 2023-08-20: 3 g via INTRAVENOUS
  Filled 2023-08-19: qty 8

## 2023-08-19 MED ORDER — ONDANSETRON HCL 4 MG/2ML IJ SOLN
4.0000 mg | Freq: Once | INTRAMUSCULAR | Status: AC
  Administered 2023-08-20: 4 mg via INTRAVENOUS
  Filled 2023-08-19: qty 2

## 2023-08-19 NOTE — ED Triage Notes (Signed)
 Pt in POV with family with reported worsened decubitis wound to L hip - area is more painful and now draining green pus per family present. Pt has had nurse coming over to the house twice weekly for dressing changes. Currently taking Doxycycline 

## 2023-08-19 NOTE — ED Provider Notes (Signed)
 [place] EMERGENCY DEPARTMENT AT [facility] Provider Note   CSN: [id number] Arrival date & time: 08/19/23  2203     Patient presents with: Wound Check   Nicole [name] is a 58 y.o. female.  {Add pertinent medical, surgical, social history, OB history to HPI:32947} Patient is a 58 year old female with extensive past medical history including type 2 diabetes poorly controlled, CHF, COPD, hypertension, coronary artery disease, atrial fibrillation.  Patient presenting today with complaints of a worsening wound to her left hip.  She has a bedsore that has been seen by home health.  She is on day 5 of doxycycline .  Family concerned about the appearance of the wound, so bring her for evaluation of this tonight.  Patient denies any fevers or chills.  No injury or trauma.       Prior to Admission medications   Medication Sig Start Date End Date Taking? Authorizing Provider  Accu-Chek Softclix Lancets lancets Use as instructed to monitor glucose 4 times daily- use as backup to Dexcom 03/10/23   Therisa Benton PARAS, NP  acetaminophen  (TYLENOL ) 500 MG tablet Take 1 tablet (500 mg total) by mouth every 6 (six) hours as needed for moderate pain. 02/05/20   Amin, Ankit C, MD  albuterol  (PROVENTIL ) (2.5 MG/3ML) 0.083% nebulizer solution Take 3 mLs (2.5 mg total) by nebulization every 6 (six) hours as needed for wheezing. 07/30/20   Elnor Lauraine BRAVO, NP  albuterol  (VENTOLIN  HFA) 108 (90 Base) MCG/ACT inhaler Inhale 2 puffs into the lungs every 6 (six) hours as needed for wheezing. Shortness of breath 07/30/20   Elnor Lauraine BRAVO, NP  blood glucose meter kit and supplies Dispense based on patient and insurance preference. Use four times daily as directed. (FOR ICD-10 E10.9, E11.9). 01/09/21   Therisa Benton PARAS, NP  clopidogrel  (PLAVIX ) 75 MG tablet Take 1 tablet (75 mg total) by mouth daily. 09/09/22   Okey Vina GAILS, MD  EASY TOUCH INSULIN  SYRINGE 31G X 5/16 1 ML MISC  09/21/19   [provider]   EPINEPHrine  0.3 mg/0.3 mL IJ SOAJ injection Inject 0.3 mg into the muscle as needed for anaphylaxis. 05/15/20   Elnor Lauraine BRAVO, NP  glucose blood (ACCU-CHEK GUIDE TEST) test strip Use as instructed to monitor glucose 4 times daily- use as back up for Dexcom 03/10/23   Therisa Benton PARAS, NP  Insulin  Pen Needle (B-D ULTRAFINE III SHORT PEN) 31G X 8 MM MISC USE AS DIRECTED 07/06/21   Lorren Greig PARAS, NP  insulin  regular human CONCENTRATED (HUMULIN  R U-500 KWIKPEN) 500 UNIT/ML KwikPen Inject 90 Units into the skin 3 (three) times daily with meals. 03/10/23   Therisa Benton PARAS, NP  loperamide  (IMODIUM ) 2 MG capsule Take 1 capsule (2 mg total) by mouth every 6 (six) hours as needed for diarrhea or loose stools. 12/10/19   Antoinette Doe, MD  Multiple Vitamins-Calcium  (ONE-A-DAY WOMENS FORMULA) TABS Take 1 tablet by mouth daily.    [provider]  mupirocin  ointment (BACTROBAN ) 2 % APPLY OINTMENT TOPICALLY ONCE DAILY 09/21/22   Standiford, Marsa FALCON, DPM  nitroGLYCERIN  (NITROSTAT ) 0.4 MG SL tablet DISSOLVE ONE TABLET UNDER THE TONGUE EVERY 5 MINUTES AS NEEDED FOR CHEST PAIN.  DO NOT EXCEED A TOTAL OF 3 DOSES IN 15 MINUTES 09/07/22   Okey Vina GAILS, MD  Nystatin  (GERHARDT'S BUTT CREAM) CREA Apply 1 Application topically 2 (two) times daily. 08/15/21   Jerrie Anger, PA  pantoprazole  (PROTONIX ) 40 MG tablet Take 1 tablet (40 mg  total) by mouth daily. Patient taking differently: Take 40 mg by mouth at bedtime. 07/30/20   Elnor Lauraine BRAVO, NP  potassium chloride  SA (KLOR-CON  M20) 20 MEQ tablet TAKE 1 TABLET BY MOUTH ONCE DAILY. PATIENT IS OVERDUE FOR A FOLLOW-UP APPOINTMENT. PLEASE SCHEDULE AN APPOINTMENT FOR ADDITIONAL REFILLS. 08/17/23   Okey Vina GAILS, MD  rosuvastatin  (CRESTOR ) 40 MG tablet Take 40 mg by mouth at bedtime. 04/16/21   [provider]  Semaglutide , 2 MG/DOSE, 8 MG/3ML SOPN Inject 2 mg as directed once a week. 03/10/23   Therisa Benton PARAS, NP  torsemide  (DEMADEX ) 20 MG tablet Take  2 tablets by mouth once daily 04/01/23   Okey Vina GAILS, MD  Vitamin D , Ergocalciferol , (DRISDOL ) 1.25 MG (50000 UNIT) CAPS capsule Take 50,000 Units by mouth every Monday. 04/16/21   [provider]  VRAYLAR  3 MG capsule Take 3 mg by mouth daily. 11/15/21   [provider]    Allergies: Aspirin , Bee venom, Pineapple, Definity  [perflutren  lipid microsphere], and E-mycin [erythromycin base]    Review of Systems  All other systems reviewed and are negative.   Updated Vital Signs BP (!) 134/57   Pulse (!) 57   Temp 99 F (37.2 C) (Oral)   Resp 17   Wt 114.3 kg   SpO2 99%   BMI 43.25 kg/m   Physical Exam Vitals and nursing note reviewed.  Constitutional:      General: She is not in acute distress.    Appearance: She is well-developed. She is not diaphoretic.  HENT:     Head: Normocephalic and atraumatic.   Cardiovascular:     Rate and Rhythm: Normal rate and regular rhythm.     Heart sounds: No murmur heard.    No friction rub. No gallop.  Pulmonary:     Effort: Pulmonary effort is normal. No respiratory distress.     Breath sounds: Normal breath sounds. No wheezing.  Abdominal:     General: Bowel sounds are normal. There is no distension.     Palpations: Abdomen is soft.     Tenderness: There is no abdominal tenderness.   Musculoskeletal:        General: Normal range of motion.     Cervical back: Normal range of motion and neck supple.   Skin:    General: Skin is warm and dry.     Comments: To the left hip, there is a 4 cm x 3 cm wound noted.  There is mild surrounding erythema on with scabbing/eschar in the center.  There is no purulent drainage.  There is also a fungal appearing infection noted to the groin in the skin folds.   Neurological:     General: No focal deficit present.     Mental Status: She is alert and oriented to person, place, and time.     (all labs ordered are listed, but only abnormal results are displayed) Labs Reviewed   COMPREHENSIVE METABOLIC PANEL WITH GFR - Abnormal; Notable for the following components:      Result Value   Glucose, Bld 153 (*)    All other components within normal limits  CBC WITH DIFFERENTIAL/PLATELET    EKG: None  Radiology: No results found.  {Document cardiac monitor, telemetry assessment procedure when appropriate:32947} Procedures   Medications Ordered in the ED  Ampicillin-Sulbactam (UNASYN) 3 g in sodium chloride  0.9 % 100 mL IVPB (has no administration in time range)  morphine  (PF) 4 MG/ML injection 4 mg (has no administration in  time range)  ondansetron  (ZOFRAN ) injection 4 mg (has no administration in time range)      {Click here for ABCD2, HEART and other calculators REFRESH Note before signing:1}                              Medical Decision Making Amount and/or Complexity of Data Reviewed Labs: ordered. Radiology: ordered.  Risk Prescription drug management.   ***  {Document critical care time when appropriate  Document review of labs and clinical decision tools ie CHADS2VASC2, etc  Document your independent review of radiology images and any outside records  Document your discussion with family members, caretakers and with consultants  Document social determinants of health affecting pt's care  Document your decision making why or why not admission, treatments were needed:32947:::1}   Final diagnoses:  None    ED Discharge Orders     None

## 2023-08-20 ENCOUNTER — Other Ambulatory Visit (HOSPITAL_COMMUNITY)

## 2023-08-20 ENCOUNTER — Emergency Department (HOSPITAL_COMMUNITY)

## 2023-08-20 DIAGNOSIS — M25552 Pain in left hip: Secondary | ICD-10-CM | POA: Diagnosis not present

## 2023-08-20 DIAGNOSIS — L89229 Pressure ulcer of left hip, unspecified stage: Secondary | ICD-10-CM | POA: Diagnosis not present

## 2023-08-20 DIAGNOSIS — M47816 Spondylosis without myelopathy or radiculopathy, lumbar region: Secondary | ICD-10-CM | POA: Diagnosis not present

## 2023-08-20 DIAGNOSIS — M160 Bilateral primary osteoarthritis of hip: Secondary | ICD-10-CM | POA: Diagnosis not present

## 2023-08-20 LAB — CBG MONITORING, ED: Glucose-Capillary: 100 mg/dL — ABNORMAL HIGH (ref 70–99)

## 2023-08-20 MED ORDER — HYDROCODONE-ACETAMINOPHEN 5-325 MG PO TABS: 1.0000 | ORAL_TABLET | Freq: Four times a day (QID) | ORAL | 0 refills | Status: DC | PRN

## 2023-08-20 MED ORDER — AMOXICILLIN-POT CLAVULANATE 500-125 MG PO TABS: 1.0000 | ORAL_TABLET | Freq: Three times a day (TID) | ORAL | 0 refills | Status: DC

## 2023-08-20 MED ORDER — NYSTATIN 100000 UNIT/GM EX POWD: 1.0000 | Freq: Three times a day (TID) | CUTANEOUS | 0 refills | Status: AC

## 2023-08-20 NOTE — Discharge Instructions (Signed)
 Begin taking Augmentin  as prescribed.  Stop taking doxycycline .  Take hydrocodone  as prescribed as needed for pain.  Continue wound care at home and follow-up with your primary doctor later this week.

## 2023-08-22 ENCOUNTER — Other Ambulatory Visit: Payer: Self-pay | Admitting: Internal Medicine

## 2023-08-22 DIAGNOSIS — I4891 Unspecified atrial fibrillation: Secondary | ICD-10-CM | POA: Diagnosis not present

## 2023-08-22 DIAGNOSIS — J449 Chronic obstructive pulmonary disease, unspecified: Secondary | ICD-10-CM | POA: Diagnosis not present

## 2023-08-22 DIAGNOSIS — M1990 Unspecified osteoarthritis, unspecified site: Secondary | ICD-10-CM | POA: Diagnosis not present

## 2023-08-22 DIAGNOSIS — Z7984 Long term (current) use of oral hypoglycemic drugs: Secondary | ICD-10-CM | POA: Diagnosis not present

## 2023-08-22 DIAGNOSIS — Z87891 Personal history of nicotine dependence: Secondary | ICD-10-CM | POA: Diagnosis not present

## 2023-08-22 DIAGNOSIS — E02 Subclinical iodine-deficiency hypothyroidism: Secondary | ICD-10-CM | POA: Diagnosis not present

## 2023-08-22 DIAGNOSIS — N182 Chronic kidney disease, stage 2 (mild): Secondary | ICD-10-CM | POA: Diagnosis not present

## 2023-08-22 DIAGNOSIS — Z7902 Long term (current) use of antithrombotics/antiplatelets: Secondary | ICD-10-CM | POA: Diagnosis not present

## 2023-08-22 DIAGNOSIS — Z7985 Long-term (current) use of injectable non-insulin antidiabetic drugs: Secondary | ICD-10-CM | POA: Diagnosis not present

## 2023-08-22 DIAGNOSIS — I2510 Atherosclerotic heart disease of native coronary artery without angina pectoris: Secondary | ICD-10-CM | POA: Diagnosis not present

## 2023-08-22 DIAGNOSIS — Z556 Problems related to health literacy: Secondary | ICD-10-CM | POA: Diagnosis not present

## 2023-08-22 DIAGNOSIS — E559 Vitamin D deficiency, unspecified: Secondary | ICD-10-CM | POA: Diagnosis not present

## 2023-08-22 DIAGNOSIS — I872 Venous insufficiency (chronic) (peripheral): Secondary | ICD-10-CM | POA: Diagnosis not present

## 2023-08-22 DIAGNOSIS — I130 Hypertensive heart and chronic kidney disease with heart failure and stage 1 through stage 4 chronic kidney disease, or unspecified chronic kidney disease: Secondary | ICD-10-CM | POA: Diagnosis not present

## 2023-08-22 DIAGNOSIS — Z5982 Transportation insecurity: Secondary | ICD-10-CM | POA: Diagnosis not present

## 2023-08-22 DIAGNOSIS — H6123 Impacted cerumen, bilateral: Secondary | ICD-10-CM | POA: Diagnosis not present

## 2023-08-22 DIAGNOSIS — L89220 Pressure ulcer of left hip, unstageable: Secondary | ICD-10-CM | POA: Diagnosis not present

## 2023-08-22 DIAGNOSIS — I509 Heart failure, unspecified: Secondary | ICD-10-CM | POA: Diagnosis not present

## 2023-08-22 DIAGNOSIS — G250 Essential tremor: Secondary | ICD-10-CM | POA: Diagnosis not present

## 2023-08-22 DIAGNOSIS — E1122 Type 2 diabetes mellitus with diabetic chronic kidney disease: Secondary | ICD-10-CM | POA: Diagnosis not present

## 2023-08-24 ENCOUNTER — Ambulatory Visit: Admitting: Urology

## 2023-08-25 DIAGNOSIS — Z7985 Long-term (current) use of injectable non-insulin antidiabetic drugs: Secondary | ICD-10-CM | POA: Diagnosis not present

## 2023-08-25 DIAGNOSIS — I872 Venous insufficiency (chronic) (peripheral): Secondary | ICD-10-CM | POA: Diagnosis not present

## 2023-08-25 DIAGNOSIS — E876 Hypokalemia: Secondary | ICD-10-CM | POA: Diagnosis not present

## 2023-08-25 DIAGNOSIS — N182 Chronic kidney disease, stage 2 (mild): Secondary | ICD-10-CM | POA: Diagnosis not present

## 2023-08-25 DIAGNOSIS — K219 Gastro-esophageal reflux disease without esophagitis: Secondary | ICD-10-CM | POA: Diagnosis not present

## 2023-08-25 DIAGNOSIS — I4891 Unspecified atrial fibrillation: Secondary | ICD-10-CM | POA: Diagnosis not present

## 2023-08-25 DIAGNOSIS — K529 Noninfective gastroenteritis and colitis, unspecified: Secondary | ICD-10-CM | POA: Diagnosis not present

## 2023-08-25 DIAGNOSIS — E1122 Type 2 diabetes mellitus with diabetic chronic kidney disease: Secondary | ICD-10-CM | POA: Diagnosis not present

## 2023-08-25 DIAGNOSIS — Z5982 Transportation insecurity: Secondary | ICD-10-CM | POA: Diagnosis not present

## 2023-08-25 DIAGNOSIS — Z7984 Long term (current) use of oral hypoglycemic drugs: Secondary | ICD-10-CM | POA: Diagnosis not present

## 2023-08-25 DIAGNOSIS — Z556 Problems related to health literacy: Secondary | ICD-10-CM | POA: Diagnosis not present

## 2023-08-25 DIAGNOSIS — I130 Hypertensive heart and chronic kidney disease with heart failure and stage 1 through stage 4 chronic kidney disease, or unspecified chronic kidney disease: Secondary | ICD-10-CM | POA: Diagnosis not present

## 2023-08-25 DIAGNOSIS — I509 Heart failure, unspecified: Secondary | ICD-10-CM | POA: Diagnosis not present

## 2023-08-25 DIAGNOSIS — I2510 Atherosclerotic heart disease of native coronary artery without angina pectoris: Secondary | ICD-10-CM | POA: Diagnosis not present

## 2023-08-25 DIAGNOSIS — L304 Erythema intertrigo: Secondary | ICD-10-CM | POA: Diagnosis not present

## 2023-08-25 DIAGNOSIS — L89220 Pressure ulcer of left hip, unstageable: Secondary | ICD-10-CM | POA: Diagnosis not present

## 2023-08-25 DIAGNOSIS — R0602 Shortness of breath: Secondary | ICD-10-CM | POA: Diagnosis not present

## 2023-08-25 DIAGNOSIS — G4733 Obstructive sleep apnea (adult) (pediatric): Secondary | ICD-10-CM | POA: Diagnosis not present

## 2023-08-25 DIAGNOSIS — H6123 Impacted cerumen, bilateral: Secondary | ICD-10-CM | POA: Diagnosis not present

## 2023-08-25 DIAGNOSIS — M1990 Unspecified osteoarthritis, unspecified site: Secondary | ICD-10-CM | POA: Diagnosis not present

## 2023-08-25 DIAGNOSIS — J302 Other seasonal allergic rhinitis: Secondary | ICD-10-CM | POA: Diagnosis not present

## 2023-08-25 DIAGNOSIS — Z7902 Long term (current) use of antithrombotics/antiplatelets: Secondary | ICD-10-CM | POA: Diagnosis not present

## 2023-08-25 DIAGNOSIS — E02 Subclinical iodine-deficiency hypothyroidism: Secondary | ICD-10-CM | POA: Diagnosis not present

## 2023-08-25 DIAGNOSIS — G250 Essential tremor: Secondary | ICD-10-CM | POA: Diagnosis not present

## 2023-08-25 DIAGNOSIS — E1165 Type 2 diabetes mellitus with hyperglycemia: Secondary | ICD-10-CM | POA: Diagnosis not present

## 2023-08-25 DIAGNOSIS — Z87891 Personal history of nicotine dependence: Secondary | ICD-10-CM | POA: Diagnosis not present

## 2023-08-25 DIAGNOSIS — E559 Vitamin D deficiency, unspecified: Secondary | ICD-10-CM | POA: Diagnosis not present

## 2023-08-25 DIAGNOSIS — J449 Chronic obstructive pulmonary disease, unspecified: Secondary | ICD-10-CM | POA: Diagnosis not present

## 2023-08-27 DIAGNOSIS — G250 Essential tremor: Secondary | ICD-10-CM | POA: Diagnosis not present

## 2023-08-27 DIAGNOSIS — I4891 Unspecified atrial fibrillation: Secondary | ICD-10-CM | POA: Diagnosis not present

## 2023-08-27 DIAGNOSIS — Z5982 Transportation insecurity: Secondary | ICD-10-CM | POA: Diagnosis not present

## 2023-08-27 DIAGNOSIS — L89220 Pressure ulcer of left hip, unstageable: Secondary | ICD-10-CM | POA: Diagnosis not present

## 2023-08-27 DIAGNOSIS — Z7985 Long-term (current) use of injectable non-insulin antidiabetic drugs: Secondary | ICD-10-CM | POA: Diagnosis not present

## 2023-08-27 DIAGNOSIS — M1990 Unspecified osteoarthritis, unspecified site: Secondary | ICD-10-CM | POA: Diagnosis not present

## 2023-08-27 DIAGNOSIS — E1122 Type 2 diabetes mellitus with diabetic chronic kidney disease: Secondary | ICD-10-CM | POA: Diagnosis not present

## 2023-08-27 DIAGNOSIS — E02 Subclinical iodine-deficiency hypothyroidism: Secondary | ICD-10-CM | POA: Diagnosis not present

## 2023-08-27 DIAGNOSIS — E559 Vitamin D deficiency, unspecified: Secondary | ICD-10-CM | POA: Diagnosis not present

## 2023-08-27 DIAGNOSIS — Z556 Problems related to health literacy: Secondary | ICD-10-CM | POA: Diagnosis not present

## 2023-08-27 DIAGNOSIS — H6123 Impacted cerumen, bilateral: Secondary | ICD-10-CM | POA: Diagnosis not present

## 2023-08-27 DIAGNOSIS — Z7984 Long term (current) use of oral hypoglycemic drugs: Secondary | ICD-10-CM | POA: Diagnosis not present

## 2023-08-27 DIAGNOSIS — I872 Venous insufficiency (chronic) (peripheral): Secondary | ICD-10-CM | POA: Diagnosis not present

## 2023-08-27 DIAGNOSIS — I509 Heart failure, unspecified: Secondary | ICD-10-CM | POA: Diagnosis not present

## 2023-08-27 DIAGNOSIS — I2510 Atherosclerotic heart disease of native coronary artery without angina pectoris: Secondary | ICD-10-CM | POA: Diagnosis not present

## 2023-08-27 DIAGNOSIS — Z7902 Long term (current) use of antithrombotics/antiplatelets: Secondary | ICD-10-CM | POA: Diagnosis not present

## 2023-08-27 DIAGNOSIS — J449 Chronic obstructive pulmonary disease, unspecified: Secondary | ICD-10-CM | POA: Diagnosis not present

## 2023-08-27 DIAGNOSIS — N182 Chronic kidney disease, stage 2 (mild): Secondary | ICD-10-CM | POA: Diagnosis not present

## 2023-08-27 DIAGNOSIS — I130 Hypertensive heart and chronic kidney disease with heart failure and stage 1 through stage 4 chronic kidney disease, or unspecified chronic kidney disease: Secondary | ICD-10-CM | POA: Diagnosis not present

## 2023-08-27 DIAGNOSIS — Z87891 Personal history of nicotine dependence: Secondary | ICD-10-CM | POA: Diagnosis not present

## 2023-08-31 DIAGNOSIS — L89890 Pressure ulcer of other site, unstageable: Secondary | ICD-10-CM | POA: Diagnosis not present

## 2023-09-01 DIAGNOSIS — Z5982 Transportation insecurity: Secondary | ICD-10-CM | POA: Diagnosis not present

## 2023-09-01 DIAGNOSIS — Z87891 Personal history of nicotine dependence: Secondary | ICD-10-CM | POA: Diagnosis not present

## 2023-09-01 DIAGNOSIS — E559 Vitamin D deficiency, unspecified: Secondary | ICD-10-CM | POA: Diagnosis not present

## 2023-09-01 DIAGNOSIS — M1990 Unspecified osteoarthritis, unspecified site: Secondary | ICD-10-CM | POA: Diagnosis not present

## 2023-09-01 DIAGNOSIS — Z7985 Long-term (current) use of injectable non-insulin antidiabetic drugs: Secondary | ICD-10-CM | POA: Diagnosis not present

## 2023-09-01 DIAGNOSIS — I509 Heart failure, unspecified: Secondary | ICD-10-CM | POA: Diagnosis not present

## 2023-09-01 DIAGNOSIS — I872 Venous insufficiency (chronic) (peripheral): Secondary | ICD-10-CM | POA: Diagnosis not present

## 2023-09-01 DIAGNOSIS — Z556 Problems related to health literacy: Secondary | ICD-10-CM | POA: Diagnosis not present

## 2023-09-01 DIAGNOSIS — Z7902 Long term (current) use of antithrombotics/antiplatelets: Secondary | ICD-10-CM | POA: Diagnosis not present

## 2023-09-01 DIAGNOSIS — Z7984 Long term (current) use of oral hypoglycemic drugs: Secondary | ICD-10-CM | POA: Diagnosis not present

## 2023-09-01 DIAGNOSIS — H6123 Impacted cerumen, bilateral: Secondary | ICD-10-CM | POA: Diagnosis not present

## 2023-09-01 DIAGNOSIS — J449 Chronic obstructive pulmonary disease, unspecified: Secondary | ICD-10-CM | POA: Diagnosis not present

## 2023-09-01 DIAGNOSIS — K529 Noninfective gastroenteritis and colitis, unspecified: Secondary | ICD-10-CM | POA: Diagnosis not present

## 2023-09-01 DIAGNOSIS — G250 Essential tremor: Secondary | ICD-10-CM | POA: Diagnosis not present

## 2023-09-01 DIAGNOSIS — L89220 Pressure ulcer of left hip, unstageable: Secondary | ICD-10-CM | POA: Diagnosis not present

## 2023-09-01 DIAGNOSIS — N182 Chronic kidney disease, stage 2 (mild): Secondary | ICD-10-CM | POA: Diagnosis not present

## 2023-09-01 DIAGNOSIS — E02 Subclinical iodine-deficiency hypothyroidism: Secondary | ICD-10-CM | POA: Diagnosis not present

## 2023-09-01 DIAGNOSIS — I2510 Atherosclerotic heart disease of native coronary artery without angina pectoris: Secondary | ICD-10-CM | POA: Diagnosis not present

## 2023-09-01 DIAGNOSIS — I4891 Unspecified atrial fibrillation: Secondary | ICD-10-CM | POA: Diagnosis not present

## 2023-09-01 DIAGNOSIS — E1122 Type 2 diabetes mellitus with diabetic chronic kidney disease: Secondary | ICD-10-CM | POA: Diagnosis not present

## 2023-09-01 DIAGNOSIS — I130 Hypertensive heart and chronic kidney disease with heart failure and stage 1 through stage 4 chronic kidney disease, or unspecified chronic kidney disease: Secondary | ICD-10-CM | POA: Diagnosis not present

## 2023-09-03 ENCOUNTER — Other Ambulatory Visit: Payer: Self-pay | Admitting: Internal Medicine

## 2023-09-05 DIAGNOSIS — S71102A Unspecified open wound, left thigh, initial encounter: Secondary | ICD-10-CM | POA: Diagnosis not present

## 2023-09-05 DIAGNOSIS — L89890 Pressure ulcer of other site, unstageable: Secondary | ICD-10-CM | POA: Diagnosis not present

## 2023-09-07 DIAGNOSIS — I872 Venous insufficiency (chronic) (peripheral): Secondary | ICD-10-CM | POA: Diagnosis not present

## 2023-09-07 DIAGNOSIS — G250 Essential tremor: Secondary | ICD-10-CM | POA: Diagnosis not present

## 2023-09-07 DIAGNOSIS — M1990 Unspecified osteoarthritis, unspecified site: Secondary | ICD-10-CM | POA: Diagnosis not present

## 2023-09-07 DIAGNOSIS — I4891 Unspecified atrial fibrillation: Secondary | ICD-10-CM | POA: Diagnosis not present

## 2023-09-07 DIAGNOSIS — Z7984 Long term (current) use of oral hypoglycemic drugs: Secondary | ICD-10-CM | POA: Diagnosis not present

## 2023-09-07 DIAGNOSIS — N182 Chronic kidney disease, stage 2 (mild): Secondary | ICD-10-CM | POA: Diagnosis not present

## 2023-09-07 DIAGNOSIS — E02 Subclinical iodine-deficiency hypothyroidism: Secondary | ICD-10-CM | POA: Diagnosis not present

## 2023-09-07 DIAGNOSIS — H6123 Impacted cerumen, bilateral: Secondary | ICD-10-CM | POA: Diagnosis not present

## 2023-09-07 DIAGNOSIS — Z5982 Transportation insecurity: Secondary | ICD-10-CM | POA: Diagnosis not present

## 2023-09-07 DIAGNOSIS — Z87891 Personal history of nicotine dependence: Secondary | ICD-10-CM | POA: Diagnosis not present

## 2023-09-07 DIAGNOSIS — I130 Hypertensive heart and chronic kidney disease with heart failure and stage 1 through stage 4 chronic kidney disease, or unspecified chronic kidney disease: Secondary | ICD-10-CM | POA: Diagnosis not present

## 2023-09-07 DIAGNOSIS — E1122 Type 2 diabetes mellitus with diabetic chronic kidney disease: Secondary | ICD-10-CM | POA: Diagnosis not present

## 2023-09-07 DIAGNOSIS — Z7985 Long-term (current) use of injectable non-insulin antidiabetic drugs: Secondary | ICD-10-CM | POA: Diagnosis not present

## 2023-09-07 DIAGNOSIS — E559 Vitamin D deficiency, unspecified: Secondary | ICD-10-CM | POA: Diagnosis not present

## 2023-09-07 DIAGNOSIS — I2510 Atherosclerotic heart disease of native coronary artery without angina pectoris: Secondary | ICD-10-CM | POA: Diagnosis not present

## 2023-09-07 DIAGNOSIS — J449 Chronic obstructive pulmonary disease, unspecified: Secondary | ICD-10-CM | POA: Diagnosis not present

## 2023-09-07 DIAGNOSIS — E1165 Type 2 diabetes mellitus with hyperglycemia: Secondary | ICD-10-CM | POA: Diagnosis not present

## 2023-09-07 DIAGNOSIS — Z556 Problems related to health literacy: Secondary | ICD-10-CM | POA: Diagnosis not present

## 2023-09-07 DIAGNOSIS — Z7902 Long term (current) use of antithrombotics/antiplatelets: Secondary | ICD-10-CM | POA: Diagnosis not present

## 2023-09-07 DIAGNOSIS — I509 Heart failure, unspecified: Secondary | ICD-10-CM | POA: Diagnosis not present

## 2023-09-07 DIAGNOSIS — L89220 Pressure ulcer of left hip, unstageable: Secondary | ICD-10-CM | POA: Diagnosis not present

## 2023-09-08 ENCOUNTER — Ambulatory Visit: Admitting: Nurse Practitioner

## 2023-09-08 ENCOUNTER — Encounter: Payer: Self-pay | Admitting: Nurse Practitioner

## 2023-09-08 DIAGNOSIS — E1165 Type 2 diabetes mellitus with hyperglycemia: Secondary | ICD-10-CM

## 2023-09-08 DIAGNOSIS — Z794 Long term (current) use of insulin: Secondary | ICD-10-CM

## 2023-09-08 DIAGNOSIS — Z7984 Long term (current) use of oral hypoglycemic drugs: Secondary | ICD-10-CM

## 2023-09-08 DIAGNOSIS — Z7985 Long-term (current) use of injectable non-insulin antidiabetic drugs: Secondary | ICD-10-CM

## 2023-09-08 DIAGNOSIS — I10 Essential (primary) hypertension: Secondary | ICD-10-CM

## 2023-09-08 DIAGNOSIS — E782 Mixed hyperlipidemia: Secondary | ICD-10-CM

## 2023-09-14 DIAGNOSIS — I130 Hypertensive heart and chronic kidney disease with heart failure and stage 1 through stage 4 chronic kidney disease, or unspecified chronic kidney disease: Secondary | ICD-10-CM | POA: Diagnosis not present

## 2023-09-14 DIAGNOSIS — I4891 Unspecified atrial fibrillation: Secondary | ICD-10-CM | POA: Diagnosis not present

## 2023-09-14 DIAGNOSIS — Z7984 Long term (current) use of oral hypoglycemic drugs: Secondary | ICD-10-CM | POA: Diagnosis not present

## 2023-09-14 DIAGNOSIS — I509 Heart failure, unspecified: Secondary | ICD-10-CM | POA: Diagnosis not present

## 2023-09-14 DIAGNOSIS — I872 Venous insufficiency (chronic) (peripheral): Secondary | ICD-10-CM | POA: Diagnosis not present

## 2023-09-14 DIAGNOSIS — I2510 Atherosclerotic heart disease of native coronary artery without angina pectoris: Secondary | ICD-10-CM | POA: Diagnosis not present

## 2023-09-14 DIAGNOSIS — Z87891 Personal history of nicotine dependence: Secondary | ICD-10-CM | POA: Diagnosis not present

## 2023-09-14 DIAGNOSIS — Z7902 Long term (current) use of antithrombotics/antiplatelets: Secondary | ICD-10-CM | POA: Diagnosis not present

## 2023-09-14 DIAGNOSIS — Z7985 Long-term (current) use of injectable non-insulin antidiabetic drugs: Secondary | ICD-10-CM | POA: Diagnosis not present

## 2023-09-14 DIAGNOSIS — L89220 Pressure ulcer of left hip, unstageable: Secondary | ICD-10-CM | POA: Diagnosis not present

## 2023-09-14 DIAGNOSIS — Z556 Problems related to health literacy: Secondary | ICD-10-CM | POA: Diagnosis not present

## 2023-09-14 DIAGNOSIS — H6123 Impacted cerumen, bilateral: Secondary | ICD-10-CM | POA: Diagnosis not present

## 2023-09-14 DIAGNOSIS — J449 Chronic obstructive pulmonary disease, unspecified: Secondary | ICD-10-CM | POA: Diagnosis not present

## 2023-09-14 DIAGNOSIS — E559 Vitamin D deficiency, unspecified: Secondary | ICD-10-CM | POA: Diagnosis not present

## 2023-09-14 DIAGNOSIS — M1990 Unspecified osteoarthritis, unspecified site: Secondary | ICD-10-CM | POA: Diagnosis not present

## 2023-09-14 DIAGNOSIS — Z5982 Transportation insecurity: Secondary | ICD-10-CM | POA: Diagnosis not present

## 2023-09-14 DIAGNOSIS — N182 Chronic kidney disease, stage 2 (mild): Secondary | ICD-10-CM | POA: Diagnosis not present

## 2023-09-14 DIAGNOSIS — G250 Essential tremor: Secondary | ICD-10-CM | POA: Diagnosis not present

## 2023-09-14 DIAGNOSIS — E1122 Type 2 diabetes mellitus with diabetic chronic kidney disease: Secondary | ICD-10-CM | POA: Diagnosis not present

## 2023-09-14 DIAGNOSIS — E02 Subclinical iodine-deficiency hypothyroidism: Secondary | ICD-10-CM | POA: Diagnosis not present

## 2023-09-16 DIAGNOSIS — G4730 Sleep apnea, unspecified: Secondary | ICD-10-CM | POA: Diagnosis not present

## 2023-09-17 ENCOUNTER — Other Ambulatory Visit: Payer: Self-pay | Admitting: Internal Medicine

## 2023-09-19 ENCOUNTER — Encounter (HOSPITAL_BASED_OUTPATIENT_CLINIC_OR_DEPARTMENT_OTHER): Admitting: Internal Medicine

## 2023-09-19 DIAGNOSIS — N182 Chronic kidney disease, stage 2 (mild): Secondary | ICD-10-CM | POA: Diagnosis not present

## 2023-09-19 DIAGNOSIS — E1122 Type 2 diabetes mellitus with diabetic chronic kidney disease: Secondary | ICD-10-CM | POA: Diagnosis not present

## 2023-09-19 DIAGNOSIS — Z87891 Personal history of nicotine dependence: Secondary | ICD-10-CM | POA: Diagnosis not present

## 2023-09-19 DIAGNOSIS — E559 Vitamin D deficiency, unspecified: Secondary | ICD-10-CM | POA: Diagnosis not present

## 2023-09-19 DIAGNOSIS — H6123 Impacted cerumen, bilateral: Secondary | ICD-10-CM | POA: Diagnosis not present

## 2023-09-19 DIAGNOSIS — Z7985 Long-term (current) use of injectable non-insulin antidiabetic drugs: Secondary | ICD-10-CM | POA: Diagnosis not present

## 2023-09-19 DIAGNOSIS — I2510 Atherosclerotic heart disease of native coronary artery without angina pectoris: Secondary | ICD-10-CM | POA: Diagnosis not present

## 2023-09-19 DIAGNOSIS — G250 Essential tremor: Secondary | ICD-10-CM | POA: Diagnosis not present

## 2023-09-19 DIAGNOSIS — I4891 Unspecified atrial fibrillation: Secondary | ICD-10-CM | POA: Diagnosis not present

## 2023-09-19 DIAGNOSIS — Z556 Problems related to health literacy: Secondary | ICD-10-CM | POA: Diagnosis not present

## 2023-09-19 DIAGNOSIS — I872 Venous insufficiency (chronic) (peripheral): Secondary | ICD-10-CM | POA: Diagnosis not present

## 2023-09-19 DIAGNOSIS — Z7984 Long term (current) use of oral hypoglycemic drugs: Secondary | ICD-10-CM | POA: Diagnosis not present

## 2023-09-19 DIAGNOSIS — I509 Heart failure, unspecified: Secondary | ICD-10-CM | POA: Diagnosis not present

## 2023-09-19 DIAGNOSIS — Z5982 Transportation insecurity: Secondary | ICD-10-CM | POA: Diagnosis not present

## 2023-09-19 DIAGNOSIS — E02 Subclinical iodine-deficiency hypothyroidism: Secondary | ICD-10-CM | POA: Diagnosis not present

## 2023-09-19 DIAGNOSIS — M1990 Unspecified osteoarthritis, unspecified site: Secondary | ICD-10-CM | POA: Diagnosis not present

## 2023-09-19 DIAGNOSIS — Z7902 Long term (current) use of antithrombotics/antiplatelets: Secondary | ICD-10-CM | POA: Diagnosis not present

## 2023-09-19 DIAGNOSIS — L89220 Pressure ulcer of left hip, unstageable: Secondary | ICD-10-CM | POA: Diagnosis not present

## 2023-09-19 DIAGNOSIS — J449 Chronic obstructive pulmonary disease, unspecified: Secondary | ICD-10-CM | POA: Diagnosis not present

## 2023-09-19 DIAGNOSIS — I130 Hypertensive heart and chronic kidney disease with heart failure and stage 1 through stage 4 chronic kidney disease, or unspecified chronic kidney disease: Secondary | ICD-10-CM | POA: Diagnosis not present

## 2023-09-21 NOTE — Progress Notes (Deleted)
 Name: Nicole [name] DOB: [birth year] MRN: [id number]  History of Present Illness: Ms. Nicole [name] is a 58 y.o. female who presents today as a new patient at [facility]. All available relevant medical records have been reviewed.   Today: She reports chief complaint of ***urinary incontinence.  She {Actions; denies-reports:120008} urge incontinence. She {Actions; denies-reports:120008} stress incontinence with ***cough/***laugh/***sneeze/***heavy lifting /***exercise. She reports the ***SUI / ***UUI is predominant.  She leaks *** times per ***. Wears *** ***pads/ ***diapers per day. She reports urinary incontinence {ACTION; IS/IS WNU:[id number]} significantly bothersome.   She reports urinary ***frequency, ***nocturia, and ***urgency. Voiding ***x/day and ***x/night on average.   She {Actions; denies-reports:120008} prior attempted treatment for these symptoms ***including ***  She {Actions; denies-reports:120008} significant caffeine intake (*** caffeinated beverages per day on average). She does take a daily diuretic (Torsemide ).  She has obstructive sleep apnea; {Actions; denies-reports:120008} wearing CPAP routinely.   She {Actions; denies-reports:120008} dysuria, gross hematuria, straining to void, or sensations of incomplete emptying.   Medications: Current Outpatient Medications  Medication Sig Dispense Refill   Accu-Chek Softclix Lancets lancets Use as instructed to monitor glucose 4 times daily- use as backup to Dexcom 100 each 12   acetaminophen  (TYLENOL ) 500 MG tablet Take 1 tablet (500 mg total) by mouth every 6 (six) hours as needed for moderate pain. 30 tablet 0   albuterol  (PROVENTIL ) (2.5 MG/3ML) 0.083% nebulizer solution Take 3 mLs (2.5 mg total) by nebulization every 6 (six) hours as needed for wheezing. 75 mL 12   albuterol  (VENTOLIN  HFA) 108 (90 Base) MCG/ACT inhaler Inhale 2 puffs into the lungs every 6 (six) hours as needed for wheezing.  Shortness of breath 18 g 3   amoxicillin -clavulanate (AUGMENTIN ) 500-125 MG tablet Take 1 tablet by mouth every 8 (eight) hours. 30 tablet 0   blood glucose meter kit and supplies Dispense based on patient and insurance preference. Use four times daily as directed. (FOR ICD-10 E10.9, E11.9). 1 each 0   clopidogrel  (PLAVIX ) 75 MG tablet Take 1 tablet (75 mg total) by mouth daily. 90 tablet 3   EASY TOUCH INSULIN  SYRINGE 31G X 5/16 1 ML MISC      EPINEPHrine  0.3 mg/0.3 mL IJ SOAJ injection Inject 0.3 mg into the muscle as needed for anaphylaxis. 1 each 2   glucose blood (ACCU-CHEK GUIDE TEST) test strip Use as instructed to monitor glucose 4 times daily- use as back up for Dexcom 100 each 12   HYDROcodone -acetaminophen  (NORCO/VICODIN) 5-325 MG tablet Take 1-2 tablets by mouth every 6 (six) hours as needed. 12 tablet 0   Insulin  Pen Needle (B-D ULTRAFINE III SHORT PEN) 31G X 8 MM MISC USE AS DIRECTED 30 each 0   insulin  regular human CONCENTRATED (HUMULIN  R U-500 KWIKPEN) 500 UNIT/ML KwikPen Inject 90 Units into the skin 3 (three) times daily with meals. 48 mL 3   loperamide  (IMODIUM ) 2 MG capsule Take 1 capsule (2 mg total) by mouth every 6 (six) hours as needed for diarrhea or loose stools. 30 capsule 0   Multiple Vitamins-Calcium  (ONE-A-DAY WOMENS FORMULA) TABS Take 1 tablet by mouth daily.     mupirocin  ointment (BACTROBAN ) 2 % APPLY OINTMENT TOPICALLY ONCE DAILY 22 g 0   nitroGLYCERIN  (NITROSTAT ) 0.4 MG SL tablet DISSOLVE ONE TABLET UNDER THE TONGUE EVERY 5 MINUTES AS NEEDED FOR CHEST PAIN.  DO NOT EXCEED A TOTAL OF 3 DOSES IN 15 MINUTES 25 tablet 3   Nystatin  (GERHARDT'S BUTT CREAM) CREA Apply 1 Application topically  2 (two) times daily. 1 each 0   nystatin  powder Apply 1 Application topically 3 (three) times daily. 15 g 0   pantoprazole  (PROTONIX ) 40 MG tablet Take 1 tablet (40 mg total) by mouth daily. (Patient taking differently: Take 40 mg by mouth at bedtime.) 90 tablet 2   potassium  chloride SA (KLOR-CON  M20) 20 MEQ tablet TAKE 1 TABLET BY MOUTH ONCE DAILY. PATIENT IS OVERDUE FOR A FOLLOW-UP APPOINTMENT. PLEASE SCHEDULE AN APPOINTMENT FOR ADDITIONAL REFILLS. 15 tablet 0   rosuvastatin  (CRESTOR ) 40 MG tablet Take 40 mg by mouth at bedtime.     Semaglutide , 2 MG/DOSE, 8 MG/3ML SOPN Inject 2 mg as directed once a week. 9 mL 3   torsemide  (DEMADEX ) 20 MG tablet Take 2 tablets by mouth once daily 30 tablet 10   Vitamin D , Ergocalciferol , (DRISDOL ) 1.25 MG (50000 UNIT) CAPS capsule Take 50,000 Units by mouth every Monday.     VRAYLAR  3 MG capsule Take 3 mg by mouth daily.     No current facility-administered medications for this visit.    Allergies: Allergies  Allergen Reactions   Aspirin  Anaphylaxis and Other (See Comments)    Throat closing    Bee Venom Anaphylaxis   Pineapple Anaphylaxis   Definity  [Perflutren  Lipid Microsphere]     Muscle Aches   E-Mycin [Erythromycin Base] Nausea And Vomiting    Past Medical History:  Diagnosis Date   CAP (community acquired pneumonia)    Streptococcus 01/2011   COPD (chronic obstructive pulmonary disease) (HCC)    Coronary atherosclerosis of native coronary artery    a. Diagnosed [place]  2006 - DES RCA, reports followup cath 2009 at [facility] b. reported 2 stents in [place]  in 2020. c. 07/2021: cath showing patent stents along RCA and mid-LAD with scattered 20% stenosis but no obstructive disease.   Dyslipidemia    Essential hypertension, benign    Morbid obesity (HCC)    Pancreatitis    Type 2 diabetes mellitus (HCC)    Past Surgical History:  Procedure Laterality Date   ABDOMINAL HERNIA REPAIR     ABDOMINAL HYSTERECTOMY     AMPUTATION Left 01/25/2020   Procedure: LEFT BELOW KNEE AMPUTATION;  Surgeon: Harden Jerona GAILS, MD;  Location: [facility] OR;  Service: Orthopedics;  Laterality: Left;   CORONARY ANGIOPLASTY WITH STENT PLACEMENT  2006   KNEE SURGERY     LEFT HEART CATH AND CORONARY ANGIOGRAPHY N/A 08/14/2021   Procedure:  LEFT HEART CATH AND CORONARY ANGIOGRAPHY;  Surgeon: Burnard Debby LABOR, MD;  Location: [facility] INVASIVE CV LAB;  Service: Cardiovascular;  Laterality: N/A;   NOSE SURGERY     Pilonidal cystectomy     TONSILLECTOMY     Family History  Problem Relation Age of Onset   Diabetes Mother    Heart failure Mother    Hypertension Mother    Kidney failure Mother    Ovarian cancer Mother    Asthma Son    Social History   Socioeconomic History   Marital status: Single    Spouse name: Not on file   Number of children: 3   Years of education: Not on file   Highest education level: Not on file  Occupational History   Occupation: Home health aide    Employer: [employer]  Tobacco Use   Smoking status: Former    Current packs/day: 0.00    Average packs/day: 0.5 packs/day for 20.0 years (10.0 ttl pk-yrs)    Types: Cigarettes    Start date:  01/22/2000    Quit date: 01/22/2020    Years since quitting: 3.6   Smokeless tobacco: Never  Vaping Use   Vaping status: Never Used  Substance and Sexual Activity   Alcohol use: No   Drug use: No   Sexual activity: Yes    Birth control/protection: Surgical  Other Topics Concern   Not on file  Social History Narrative   Not on file   Social Drivers of Health   Financial Resource Strain: Not on file  Food Insecurity: No Food Insecurity (12/11/2021)   Hunger Vital Sign    Worried About Running Out of Food in the Last Year: Never true    Ran Out of Food in the Last Year: Never true  Transportation Needs: No Transportation Needs (12/11/2021)   PRAPARE - Administrator, Civil Service (Medical): No    Lack of Transportation (Non-Medical): No  Physical Activity: Not on file  Stress: Not on file  Social Connections: Not on file  Intimate Partner Violence: Not At Risk (12/11/2021)   Humiliation, Afraid, Rape, and Kick questionnaire    Fear of Current or Ex-Partner: No    Emotionally Abused: No    Physically Abused: No     Sexually Abused: No    SUBJECTIVE  Review of Systems Constitutional: Patient denies any unintentional weight loss or change in strength lntegumentary: Patient denies any rashes or pruritus Eyes: Patient {Actions; denies-reports:120008} dry eyes ENT: Patient {Actions; denies-reports:120008} dry mouth Cardiovascular: Patient denies chest pain or syncope Respiratory: Patient denies shortness of breath Gastrointestinal: Patient {Actions; denies-reports:120008} constipation  Musculoskeletal: Patient denies muscle cramps or weakness Neurologic: Patient denies convulsions or seizures Allergic/Immunologic: Patient denies recent allergic reaction(s) Hematologic/Lymphatic: Patient denies bleeding tendencies Endocrine: Patient denies heat/cold intolerance  GU: As per HPI.  OBJECTIVE There were no vitals filed for this visit. There is no height or weight on file to calculate BMI.  Physical Examination Constitutional: No obvious distress; patient is non-toxic appearing  Cardiovascular: No visible lower extremity edema.  Respiratory: The patient does not have audible wheezing/stridor; respirations do not appear labored  Gastrointestinal: Abdomen non-distended Musculoskeletal: Normal ROM of UEs  Skin: No obvious rashes/open sores  Neurologic: CN 2-12 grossly intact Psychiatric: Answered questions appropriately with normal affect  Hematologic/Lymphatic/Immunologic: No obvious bruises or sites of spontaneous bleeding  UA: ***negative ***positive for *** leukocytes, *** blood, ***nitrites Urine microscopy: *** WBC/hpf, *** RBC/hpf, *** bacteria ***glucosuria (secondary to ***Jardiance ***Farxiga  use) ***otherwise unremarkable  PVR: *** ml  ASSESSMENT No diagnosis found.  We discussed the different forms of urinary incontinence, such as stress and urge incontinence, and described how symptoms are consistent with mixed urinary incontinence (***urge ***stress predominant).  ***  Will  plan for follow up in *** weeks / *** months or sooner if needed. Patient verbalized understanding and agreement. All questions were answered.  PLAN Advised the following: ***. *** ***. Minimize / avoid caffeine intake. ***. Work on timed voiding. ***. No follow-ups on file.  No orders of the defined types were placed in this encounter.   It has been explained that the patient is to follow regularly with their PCP in addition to all other providers involved in their care and to follow instructions provided by these respective offices. Patient advised to contact urology clinic if any urologic-pertaining questions, concerns, new symptoms or problems arise in the interim period.  There are no Patient Instructions on file for this visit.  Electronically signed by:  Lauraine KYM Oz, MSN, FNP-C, CUNP  09/21/2023 2:33 PM

## 2023-09-22 ENCOUNTER — Ambulatory Visit: Admitting: Urology

## 2023-09-22 DIAGNOSIS — N182 Chronic kidney disease, stage 2 (mild): Secondary | ICD-10-CM | POA: Diagnosis not present

## 2023-09-22 DIAGNOSIS — E1122 Type 2 diabetes mellitus with diabetic chronic kidney disease: Secondary | ICD-10-CM | POA: Diagnosis not present

## 2023-09-22 DIAGNOSIS — I130 Hypertensive heart and chronic kidney disease with heart failure and stage 1 through stage 4 chronic kidney disease, or unspecified chronic kidney disease: Secondary | ICD-10-CM | POA: Diagnosis not present

## 2023-09-22 DIAGNOSIS — G250 Essential tremor: Secondary | ICD-10-CM | POA: Diagnosis not present

## 2023-09-22 DIAGNOSIS — M1990 Unspecified osteoarthritis, unspecified site: Secondary | ICD-10-CM | POA: Diagnosis not present

## 2023-09-22 DIAGNOSIS — I872 Venous insufficiency (chronic) (peripheral): Secondary | ICD-10-CM | POA: Diagnosis not present

## 2023-09-22 DIAGNOSIS — Z556 Problems related to health literacy: Secondary | ICD-10-CM | POA: Diagnosis not present

## 2023-09-22 DIAGNOSIS — E02 Subclinical iodine-deficiency hypothyroidism: Secondary | ICD-10-CM | POA: Diagnosis not present

## 2023-09-22 DIAGNOSIS — J449 Chronic obstructive pulmonary disease, unspecified: Secondary | ICD-10-CM | POA: Diagnosis not present

## 2023-09-22 DIAGNOSIS — Z7902 Long term (current) use of antithrombotics/antiplatelets: Secondary | ICD-10-CM | POA: Diagnosis not present

## 2023-09-22 DIAGNOSIS — I4891 Unspecified atrial fibrillation: Secondary | ICD-10-CM | POA: Diagnosis not present

## 2023-09-22 DIAGNOSIS — E1165 Type 2 diabetes mellitus with hyperglycemia: Secondary | ICD-10-CM

## 2023-09-22 DIAGNOSIS — R32 Unspecified urinary incontinence: Secondary | ICD-10-CM

## 2023-09-22 DIAGNOSIS — I509 Heart failure, unspecified: Secondary | ICD-10-CM | POA: Diagnosis not present

## 2023-09-22 DIAGNOSIS — E559 Vitamin D deficiency, unspecified: Secondary | ICD-10-CM | POA: Diagnosis not present

## 2023-09-22 DIAGNOSIS — Z7984 Long term (current) use of oral hypoglycemic drugs: Secondary | ICD-10-CM | POA: Diagnosis not present

## 2023-09-22 DIAGNOSIS — Z7985 Long-term (current) use of injectable non-insulin antidiabetic drugs: Secondary | ICD-10-CM | POA: Diagnosis not present

## 2023-09-22 DIAGNOSIS — Z87891 Personal history of nicotine dependence: Secondary | ICD-10-CM | POA: Diagnosis not present

## 2023-09-22 DIAGNOSIS — Z5982 Transportation insecurity: Secondary | ICD-10-CM | POA: Diagnosis not present

## 2023-09-22 DIAGNOSIS — H6123 Impacted cerumen, bilateral: Secondary | ICD-10-CM | POA: Diagnosis not present

## 2023-09-22 DIAGNOSIS — L89220 Pressure ulcer of left hip, unstageable: Secondary | ICD-10-CM | POA: Diagnosis not present

## 2023-09-22 DIAGNOSIS — I2510 Atherosclerotic heart disease of native coronary artery without angina pectoris: Secondary | ICD-10-CM | POA: Diagnosis not present

## 2023-09-22 DIAGNOSIS — Z720 Tobacco use: Secondary | ICD-10-CM

## 2023-09-26 DIAGNOSIS — Z7902 Long term (current) use of antithrombotics/antiplatelets: Secondary | ICD-10-CM | POA: Diagnosis not present

## 2023-09-26 DIAGNOSIS — I2510 Atherosclerotic heart disease of native coronary artery without angina pectoris: Secondary | ICD-10-CM | POA: Diagnosis not present

## 2023-09-26 DIAGNOSIS — L89890 Pressure ulcer of other site, unstageable: Secondary | ICD-10-CM | POA: Diagnosis not present

## 2023-09-26 DIAGNOSIS — L89220 Pressure ulcer of left hip, unstageable: Secondary | ICD-10-CM | POA: Diagnosis not present

## 2023-09-26 DIAGNOSIS — I4891 Unspecified atrial fibrillation: Secondary | ICD-10-CM | POA: Diagnosis not present

## 2023-09-26 DIAGNOSIS — E559 Vitamin D deficiency, unspecified: Secondary | ICD-10-CM | POA: Diagnosis not present

## 2023-09-26 DIAGNOSIS — E02 Subclinical iodine-deficiency hypothyroidism: Secondary | ICD-10-CM | POA: Diagnosis not present

## 2023-09-26 DIAGNOSIS — M1990 Unspecified osteoarthritis, unspecified site: Secondary | ICD-10-CM | POA: Diagnosis not present

## 2023-09-26 DIAGNOSIS — I509 Heart failure, unspecified: Secondary | ICD-10-CM | POA: Diagnosis not present

## 2023-09-26 DIAGNOSIS — I130 Hypertensive heart and chronic kidney disease with heart failure and stage 1 through stage 4 chronic kidney disease, or unspecified chronic kidney disease: Secondary | ICD-10-CM | POA: Diagnosis not present

## 2023-09-26 DIAGNOSIS — H6123 Impacted cerumen, bilateral: Secondary | ICD-10-CM | POA: Diagnosis not present

## 2023-09-26 DIAGNOSIS — Z87891 Personal history of nicotine dependence: Secondary | ICD-10-CM | POA: Diagnosis not present

## 2023-09-26 DIAGNOSIS — E1122 Type 2 diabetes mellitus with diabetic chronic kidney disease: Secondary | ICD-10-CM | POA: Diagnosis not present

## 2023-09-26 DIAGNOSIS — Z5982 Transportation insecurity: Secondary | ICD-10-CM | POA: Diagnosis not present

## 2023-09-26 DIAGNOSIS — J449 Chronic obstructive pulmonary disease, unspecified: Secondary | ICD-10-CM | POA: Diagnosis not present

## 2023-09-26 DIAGNOSIS — N182 Chronic kidney disease, stage 2 (mild): Secondary | ICD-10-CM | POA: Diagnosis not present

## 2023-09-26 DIAGNOSIS — Z7984 Long term (current) use of oral hypoglycemic drugs: Secondary | ICD-10-CM | POA: Diagnosis not present

## 2023-09-26 DIAGNOSIS — I872 Venous insufficiency (chronic) (peripheral): Secondary | ICD-10-CM | POA: Diagnosis not present

## 2023-09-26 DIAGNOSIS — G250 Essential tremor: Secondary | ICD-10-CM | POA: Diagnosis not present

## 2023-09-26 DIAGNOSIS — Z556 Problems related to health literacy: Secondary | ICD-10-CM | POA: Diagnosis not present

## 2023-09-26 DIAGNOSIS — Z7985 Long-term (current) use of injectable non-insulin antidiabetic drugs: Secondary | ICD-10-CM | POA: Diagnosis not present

## 2023-09-29 DIAGNOSIS — G250 Essential tremor: Secondary | ICD-10-CM | POA: Diagnosis not present

## 2023-09-29 DIAGNOSIS — I509 Heart failure, unspecified: Secondary | ICD-10-CM | POA: Diagnosis not present

## 2023-09-29 DIAGNOSIS — L89220 Pressure ulcer of left hip, unstageable: Secondary | ICD-10-CM | POA: Diagnosis not present

## 2023-09-29 DIAGNOSIS — I4891 Unspecified atrial fibrillation: Secondary | ICD-10-CM | POA: Diagnosis not present

## 2023-09-29 DIAGNOSIS — N182 Chronic kidney disease, stage 2 (mild): Secondary | ICD-10-CM | POA: Diagnosis not present

## 2023-09-29 DIAGNOSIS — Z7985 Long-term (current) use of injectable non-insulin antidiabetic drugs: Secondary | ICD-10-CM | POA: Diagnosis not present

## 2023-09-29 DIAGNOSIS — H6123 Impacted cerumen, bilateral: Secondary | ICD-10-CM | POA: Diagnosis not present

## 2023-09-29 DIAGNOSIS — Z87891 Personal history of nicotine dependence: Secondary | ICD-10-CM | POA: Diagnosis not present

## 2023-09-29 DIAGNOSIS — I130 Hypertensive heart and chronic kidney disease with heart failure and stage 1 through stage 4 chronic kidney disease, or unspecified chronic kidney disease: Secondary | ICD-10-CM | POA: Diagnosis not present

## 2023-09-29 DIAGNOSIS — I2510 Atherosclerotic heart disease of native coronary artery without angina pectoris: Secondary | ICD-10-CM | POA: Diagnosis not present

## 2023-09-29 DIAGNOSIS — Z7902 Long term (current) use of antithrombotics/antiplatelets: Secondary | ICD-10-CM | POA: Diagnosis not present

## 2023-09-29 DIAGNOSIS — E1122 Type 2 diabetes mellitus with diabetic chronic kidney disease: Secondary | ICD-10-CM | POA: Diagnosis not present

## 2023-09-29 DIAGNOSIS — M1990 Unspecified osteoarthritis, unspecified site: Secondary | ICD-10-CM | POA: Diagnosis not present

## 2023-09-29 DIAGNOSIS — Z7984 Long term (current) use of oral hypoglycemic drugs: Secondary | ICD-10-CM | POA: Diagnosis not present

## 2023-09-29 DIAGNOSIS — J449 Chronic obstructive pulmonary disease, unspecified: Secondary | ICD-10-CM | POA: Diagnosis not present

## 2023-09-29 DIAGNOSIS — E02 Subclinical iodine-deficiency hypothyroidism: Secondary | ICD-10-CM | POA: Diagnosis not present

## 2023-09-29 DIAGNOSIS — Z5982 Transportation insecurity: Secondary | ICD-10-CM | POA: Diagnosis not present

## 2023-09-29 DIAGNOSIS — I872 Venous insufficiency (chronic) (peripheral): Secondary | ICD-10-CM | POA: Diagnosis not present

## 2023-09-29 DIAGNOSIS — E559 Vitamin D deficiency, unspecified: Secondary | ICD-10-CM | POA: Diagnosis not present

## 2023-09-29 DIAGNOSIS — Z556 Problems related to health literacy: Secondary | ICD-10-CM | POA: Diagnosis not present

## 2023-09-29 DIAGNOSIS — L89890 Pressure ulcer of other site, unstageable: Secondary | ICD-10-CM | POA: Diagnosis not present

## 2023-10-05 DIAGNOSIS — J449 Chronic obstructive pulmonary disease, unspecified: Secondary | ICD-10-CM | POA: Diagnosis not present

## 2023-10-05 DIAGNOSIS — E1122 Type 2 diabetes mellitus with diabetic chronic kidney disease: Secondary | ICD-10-CM | POA: Diagnosis not present

## 2023-10-05 DIAGNOSIS — I509 Heart failure, unspecified: Secondary | ICD-10-CM | POA: Diagnosis not present

## 2023-10-05 DIAGNOSIS — Z7902 Long term (current) use of antithrombotics/antiplatelets: Secondary | ICD-10-CM | POA: Diagnosis not present

## 2023-10-05 DIAGNOSIS — L89220 Pressure ulcer of left hip, unstageable: Secondary | ICD-10-CM | POA: Diagnosis not present

## 2023-10-05 DIAGNOSIS — M1990 Unspecified osteoarthritis, unspecified site: Secondary | ICD-10-CM | POA: Diagnosis not present

## 2023-10-05 DIAGNOSIS — Z556 Problems related to health literacy: Secondary | ICD-10-CM | POA: Diagnosis not present

## 2023-10-05 DIAGNOSIS — Z87891 Personal history of nicotine dependence: Secondary | ICD-10-CM | POA: Diagnosis not present

## 2023-10-05 DIAGNOSIS — E559 Vitamin D deficiency, unspecified: Secondary | ICD-10-CM | POA: Diagnosis not present

## 2023-10-05 DIAGNOSIS — I130 Hypertensive heart and chronic kidney disease with heart failure and stage 1 through stage 4 chronic kidney disease, or unspecified chronic kidney disease: Secondary | ICD-10-CM | POA: Diagnosis not present

## 2023-10-05 DIAGNOSIS — Z7985 Long-term (current) use of injectable non-insulin antidiabetic drugs: Secondary | ICD-10-CM | POA: Diagnosis not present

## 2023-10-05 DIAGNOSIS — G250 Essential tremor: Secondary | ICD-10-CM | POA: Diagnosis not present

## 2023-10-05 DIAGNOSIS — Z7984 Long term (current) use of oral hypoglycemic drugs: Secondary | ICD-10-CM | POA: Diagnosis not present

## 2023-10-05 DIAGNOSIS — I2510 Atherosclerotic heart disease of native coronary artery without angina pectoris: Secondary | ICD-10-CM | POA: Diagnosis not present

## 2023-10-05 DIAGNOSIS — H6123 Impacted cerumen, bilateral: Secondary | ICD-10-CM | POA: Diagnosis not present

## 2023-10-05 DIAGNOSIS — I872 Venous insufficiency (chronic) (peripheral): Secondary | ICD-10-CM | POA: Diagnosis not present

## 2023-10-05 DIAGNOSIS — E02 Subclinical iodine-deficiency hypothyroidism: Secondary | ICD-10-CM | POA: Diagnosis not present

## 2023-10-05 DIAGNOSIS — Z5982 Transportation insecurity: Secondary | ICD-10-CM | POA: Diagnosis not present

## 2023-10-05 DIAGNOSIS — I4891 Unspecified atrial fibrillation: Secondary | ICD-10-CM | POA: Diagnosis not present

## 2023-10-05 DIAGNOSIS — N182 Chronic kidney disease, stage 2 (mild): Secondary | ICD-10-CM | POA: Diagnosis not present

## 2023-10-10 DIAGNOSIS — H6123 Impacted cerumen, bilateral: Secondary | ICD-10-CM | POA: Diagnosis not present

## 2023-10-10 DIAGNOSIS — I2510 Atherosclerotic heart disease of native coronary artery without angina pectoris: Secondary | ICD-10-CM | POA: Diagnosis not present

## 2023-10-10 DIAGNOSIS — I872 Venous insufficiency (chronic) (peripheral): Secondary | ICD-10-CM | POA: Diagnosis not present

## 2023-10-10 DIAGNOSIS — I130 Hypertensive heart and chronic kidney disease with heart failure and stage 1 through stage 4 chronic kidney disease, or unspecified chronic kidney disease: Secondary | ICD-10-CM | POA: Diagnosis not present

## 2023-10-10 DIAGNOSIS — M1990 Unspecified osteoarthritis, unspecified site: Secondary | ICD-10-CM | POA: Diagnosis not present

## 2023-10-10 DIAGNOSIS — Z7985 Long-term (current) use of injectable non-insulin antidiabetic drugs: Secondary | ICD-10-CM | POA: Diagnosis not present

## 2023-10-10 DIAGNOSIS — I4891 Unspecified atrial fibrillation: Secondary | ICD-10-CM | POA: Diagnosis not present

## 2023-10-10 DIAGNOSIS — Z7902 Long term (current) use of antithrombotics/antiplatelets: Secondary | ICD-10-CM | POA: Diagnosis not present

## 2023-10-10 DIAGNOSIS — Z87891 Personal history of nicotine dependence: Secondary | ICD-10-CM | POA: Diagnosis not present

## 2023-10-10 DIAGNOSIS — Z5982 Transportation insecurity: Secondary | ICD-10-CM | POA: Diagnosis not present

## 2023-10-10 DIAGNOSIS — E559 Vitamin D deficiency, unspecified: Secondary | ICD-10-CM | POA: Diagnosis not present

## 2023-10-10 DIAGNOSIS — Z556 Problems related to health literacy: Secondary | ICD-10-CM | POA: Diagnosis not present

## 2023-10-10 DIAGNOSIS — J449 Chronic obstructive pulmonary disease, unspecified: Secondary | ICD-10-CM | POA: Diagnosis not present

## 2023-10-10 DIAGNOSIS — E02 Subclinical iodine-deficiency hypothyroidism: Secondary | ICD-10-CM | POA: Diagnosis not present

## 2023-10-10 DIAGNOSIS — I509 Heart failure, unspecified: Secondary | ICD-10-CM | POA: Diagnosis not present

## 2023-10-10 DIAGNOSIS — Z7984 Long term (current) use of oral hypoglycemic drugs: Secondary | ICD-10-CM | POA: Diagnosis not present

## 2023-10-10 DIAGNOSIS — E1122 Type 2 diabetes mellitus with diabetic chronic kidney disease: Secondary | ICD-10-CM | POA: Diagnosis not present

## 2023-10-10 DIAGNOSIS — G250 Essential tremor: Secondary | ICD-10-CM | POA: Diagnosis not present

## 2023-10-10 DIAGNOSIS — N182 Chronic kidney disease, stage 2 (mild): Secondary | ICD-10-CM | POA: Diagnosis not present

## 2023-10-10 DIAGNOSIS — L89220 Pressure ulcer of left hip, unstageable: Secondary | ICD-10-CM | POA: Diagnosis not present

## 2023-10-13 DIAGNOSIS — L89220 Pressure ulcer of left hip, unstageable: Secondary | ICD-10-CM | POA: Diagnosis not present

## 2023-10-13 DIAGNOSIS — E1122 Type 2 diabetes mellitus with diabetic chronic kidney disease: Secondary | ICD-10-CM | POA: Diagnosis not present

## 2023-10-13 DIAGNOSIS — E02 Subclinical iodine-deficiency hypothyroidism: Secondary | ICD-10-CM | POA: Diagnosis not present

## 2023-10-13 DIAGNOSIS — H6123 Impacted cerumen, bilateral: Secondary | ICD-10-CM | POA: Diagnosis not present

## 2023-10-13 DIAGNOSIS — E559 Vitamin D deficiency, unspecified: Secondary | ICD-10-CM | POA: Diagnosis not present

## 2023-10-13 DIAGNOSIS — I872 Venous insufficiency (chronic) (peripheral): Secondary | ICD-10-CM | POA: Diagnosis not present

## 2023-10-13 DIAGNOSIS — G250 Essential tremor: Secondary | ICD-10-CM | POA: Diagnosis not present

## 2023-10-13 DIAGNOSIS — Z87891 Personal history of nicotine dependence: Secondary | ICD-10-CM | POA: Diagnosis not present

## 2023-10-13 DIAGNOSIS — Z7902 Long term (current) use of antithrombotics/antiplatelets: Secondary | ICD-10-CM | POA: Diagnosis not present

## 2023-10-13 DIAGNOSIS — Z7984 Long term (current) use of oral hypoglycemic drugs: Secondary | ICD-10-CM | POA: Diagnosis not present

## 2023-10-13 DIAGNOSIS — J449 Chronic obstructive pulmonary disease, unspecified: Secondary | ICD-10-CM | POA: Diagnosis not present

## 2023-10-13 DIAGNOSIS — Z5982 Transportation insecurity: Secondary | ICD-10-CM | POA: Diagnosis not present

## 2023-10-13 DIAGNOSIS — I4891 Unspecified atrial fibrillation: Secondary | ICD-10-CM | POA: Diagnosis not present

## 2023-10-13 DIAGNOSIS — Z556 Problems related to health literacy: Secondary | ICD-10-CM | POA: Diagnosis not present

## 2023-10-13 DIAGNOSIS — Z7985 Long-term (current) use of injectable non-insulin antidiabetic drugs: Secondary | ICD-10-CM | POA: Diagnosis not present

## 2023-10-13 DIAGNOSIS — I130 Hypertensive heart and chronic kidney disease with heart failure and stage 1 through stage 4 chronic kidney disease, or unspecified chronic kidney disease: Secondary | ICD-10-CM | POA: Diagnosis not present

## 2023-10-13 DIAGNOSIS — M1990 Unspecified osteoarthritis, unspecified site: Secondary | ICD-10-CM | POA: Diagnosis not present

## 2023-10-13 DIAGNOSIS — I509 Heart failure, unspecified: Secondary | ICD-10-CM | POA: Diagnosis not present

## 2023-10-13 DIAGNOSIS — I2510 Atherosclerotic heart disease of native coronary artery without angina pectoris: Secondary | ICD-10-CM | POA: Diagnosis not present

## 2023-10-13 DIAGNOSIS — N182 Chronic kidney disease, stage 2 (mild): Secondary | ICD-10-CM | POA: Diagnosis not present

## 2023-10-17 DIAGNOSIS — Z7985 Long-term (current) use of injectable non-insulin antidiabetic drugs: Secondary | ICD-10-CM | POA: Diagnosis not present

## 2023-10-17 DIAGNOSIS — I872 Venous insufficiency (chronic) (peripheral): Secondary | ICD-10-CM | POA: Diagnosis not present

## 2023-10-17 DIAGNOSIS — I2510 Atherosclerotic heart disease of native coronary artery without angina pectoris: Secondary | ICD-10-CM | POA: Diagnosis not present

## 2023-10-17 DIAGNOSIS — H6123 Impacted cerumen, bilateral: Secondary | ICD-10-CM | POA: Diagnosis not present

## 2023-10-17 DIAGNOSIS — I509 Heart failure, unspecified: Secondary | ICD-10-CM | POA: Diagnosis not present

## 2023-10-17 DIAGNOSIS — Z556 Problems related to health literacy: Secondary | ICD-10-CM | POA: Diagnosis not present

## 2023-10-17 DIAGNOSIS — Z5982 Transportation insecurity: Secondary | ICD-10-CM | POA: Diagnosis not present

## 2023-10-17 DIAGNOSIS — I130 Hypertensive heart and chronic kidney disease with heart failure and stage 1 through stage 4 chronic kidney disease, or unspecified chronic kidney disease: Secondary | ICD-10-CM | POA: Diagnosis not present

## 2023-10-17 DIAGNOSIS — N182 Chronic kidney disease, stage 2 (mild): Secondary | ICD-10-CM | POA: Diagnosis not present

## 2023-10-17 DIAGNOSIS — J449 Chronic obstructive pulmonary disease, unspecified: Secondary | ICD-10-CM | POA: Diagnosis not present

## 2023-10-17 DIAGNOSIS — L89220 Pressure ulcer of left hip, unstageable: Secondary | ICD-10-CM | POA: Diagnosis not present

## 2023-10-17 DIAGNOSIS — E559 Vitamin D deficiency, unspecified: Secondary | ICD-10-CM | POA: Diagnosis not present

## 2023-10-17 DIAGNOSIS — E1122 Type 2 diabetes mellitus with diabetic chronic kidney disease: Secondary | ICD-10-CM | POA: Diagnosis not present

## 2023-10-17 DIAGNOSIS — Z87891 Personal history of nicotine dependence: Secondary | ICD-10-CM | POA: Diagnosis not present

## 2023-10-17 DIAGNOSIS — Z7902 Long term (current) use of antithrombotics/antiplatelets: Secondary | ICD-10-CM | POA: Diagnosis not present

## 2023-10-17 DIAGNOSIS — G250 Essential tremor: Secondary | ICD-10-CM | POA: Diagnosis not present

## 2023-10-17 DIAGNOSIS — E02 Subclinical iodine-deficiency hypothyroidism: Secondary | ICD-10-CM | POA: Diagnosis not present

## 2023-10-17 DIAGNOSIS — Z7984 Long term (current) use of oral hypoglycemic drugs: Secondary | ICD-10-CM | POA: Diagnosis not present

## 2023-10-17 DIAGNOSIS — I4891 Unspecified atrial fibrillation: Secondary | ICD-10-CM | POA: Diagnosis not present

## 2023-10-17 DIAGNOSIS — M1990 Unspecified osteoarthritis, unspecified site: Secondary | ICD-10-CM | POA: Diagnosis not present

## 2023-10-19 DIAGNOSIS — L89890 Pressure ulcer of other site, unstageable: Secondary | ICD-10-CM | POA: Diagnosis not present

## 2023-10-19 DIAGNOSIS — S71102A Unspecified open wound, left thigh, initial encounter: Secondary | ICD-10-CM | POA: Diagnosis not present

## 2023-10-21 ENCOUNTER — Other Ambulatory Visit: Payer: Self-pay | Admitting: Internal Medicine

## 2023-10-21 DIAGNOSIS — I10 Essential (primary) hypertension: Secondary | ICD-10-CM

## 2023-10-21 DIAGNOSIS — E785 Hyperlipidemia, unspecified: Secondary | ICD-10-CM

## 2023-10-25 ENCOUNTER — Other Ambulatory Visit: Payer: Self-pay | Admitting: Internal Medicine

## 2023-10-25 ENCOUNTER — Other Ambulatory Visit (HOSPITAL_COMMUNITY): Payer: Self-pay | Admitting: Family Medicine

## 2023-10-25 DIAGNOSIS — S71002D Unspecified open wound, left hip, subsequent encounter: Secondary | ICD-10-CM | POA: Diagnosis not present

## 2023-10-25 DIAGNOSIS — I10 Essential (primary) hypertension: Secondary | ICD-10-CM

## 2023-10-25 DIAGNOSIS — G4733 Obstructive sleep apnea (adult) (pediatric): Secondary | ICD-10-CM | POA: Diagnosis not present

## 2023-10-25 DIAGNOSIS — Z9989 Dependence on other enabling machines and devices: Secondary | ICD-10-CM | POA: Diagnosis not present

## 2023-10-25 DIAGNOSIS — R600 Localized edema: Secondary | ICD-10-CM | POA: Diagnosis not present

## 2023-10-25 DIAGNOSIS — L304 Erythema intertrigo: Secondary | ICD-10-CM | POA: Diagnosis not present

## 2023-10-25 DIAGNOSIS — R001 Bradycardia, unspecified: Secondary | ICD-10-CM | POA: Diagnosis not present

## 2023-10-25 DIAGNOSIS — E785 Hyperlipidemia, unspecified: Secondary | ICD-10-CM

## 2023-10-25 DIAGNOSIS — I509 Heart failure, unspecified: Secondary | ICD-10-CM | POA: Diagnosis not present

## 2023-10-25 DIAGNOSIS — G250 Essential tremor: Secondary | ICD-10-CM | POA: Diagnosis not present

## 2023-10-26 DIAGNOSIS — E02 Subclinical iodine-deficiency hypothyroidism: Secondary | ICD-10-CM | POA: Diagnosis not present

## 2023-10-26 DIAGNOSIS — Z7985 Long-term (current) use of injectable non-insulin antidiabetic drugs: Secondary | ICD-10-CM | POA: Diagnosis not present

## 2023-10-26 DIAGNOSIS — E559 Vitamin D deficiency, unspecified: Secondary | ICD-10-CM | POA: Diagnosis not present

## 2023-10-26 DIAGNOSIS — G250 Essential tremor: Secondary | ICD-10-CM | POA: Diagnosis not present

## 2023-10-26 DIAGNOSIS — I872 Venous insufficiency (chronic) (peripheral): Secondary | ICD-10-CM | POA: Diagnosis not present

## 2023-10-26 DIAGNOSIS — I509 Heart failure, unspecified: Secondary | ICD-10-CM | POA: Diagnosis not present

## 2023-10-26 DIAGNOSIS — J449 Chronic obstructive pulmonary disease, unspecified: Secondary | ICD-10-CM | POA: Diagnosis not present

## 2023-10-26 DIAGNOSIS — N182 Chronic kidney disease, stage 2 (mild): Secondary | ICD-10-CM | POA: Diagnosis not present

## 2023-10-26 DIAGNOSIS — M1990 Unspecified osteoarthritis, unspecified site: Secondary | ICD-10-CM | POA: Diagnosis not present

## 2023-10-26 DIAGNOSIS — H6123 Impacted cerumen, bilateral: Secondary | ICD-10-CM | POA: Diagnosis not present

## 2023-10-26 DIAGNOSIS — L89220 Pressure ulcer of left hip, unstageable: Secondary | ICD-10-CM | POA: Diagnosis not present

## 2023-10-26 DIAGNOSIS — Z5982 Transportation insecurity: Secondary | ICD-10-CM | POA: Diagnosis not present

## 2023-10-26 DIAGNOSIS — Z87891 Personal history of nicotine dependence: Secondary | ICD-10-CM | POA: Diagnosis not present

## 2023-10-26 DIAGNOSIS — I2510 Atherosclerotic heart disease of native coronary artery without angina pectoris: Secondary | ICD-10-CM | POA: Diagnosis not present

## 2023-10-26 DIAGNOSIS — Z7902 Long term (current) use of antithrombotics/antiplatelets: Secondary | ICD-10-CM | POA: Diagnosis not present

## 2023-10-26 DIAGNOSIS — Z7984 Long term (current) use of oral hypoglycemic drugs: Secondary | ICD-10-CM | POA: Diagnosis not present

## 2023-10-26 DIAGNOSIS — E1122 Type 2 diabetes mellitus with diabetic chronic kidney disease: Secondary | ICD-10-CM | POA: Diagnosis not present

## 2023-10-26 DIAGNOSIS — Z556 Problems related to health literacy: Secondary | ICD-10-CM | POA: Diagnosis not present

## 2023-10-26 DIAGNOSIS — I130 Hypertensive heart and chronic kidney disease with heart failure and stage 1 through stage 4 chronic kidney disease, or unspecified chronic kidney disease: Secondary | ICD-10-CM | POA: Diagnosis not present

## 2023-10-26 DIAGNOSIS — I4891 Unspecified atrial fibrillation: Secondary | ICD-10-CM | POA: Diagnosis not present

## 2023-10-28 DIAGNOSIS — L89220 Pressure ulcer of left hip, unstageable: Secondary | ICD-10-CM | POA: Diagnosis not present

## 2023-10-28 DIAGNOSIS — G250 Essential tremor: Secondary | ICD-10-CM | POA: Diagnosis not present

## 2023-10-28 DIAGNOSIS — E559 Vitamin D deficiency, unspecified: Secondary | ICD-10-CM | POA: Diagnosis not present

## 2023-10-28 DIAGNOSIS — Z87891 Personal history of nicotine dependence: Secondary | ICD-10-CM | POA: Diagnosis not present

## 2023-10-28 DIAGNOSIS — Z7984 Long term (current) use of oral hypoglycemic drugs: Secondary | ICD-10-CM | POA: Diagnosis not present

## 2023-10-28 DIAGNOSIS — H6123 Impacted cerumen, bilateral: Secondary | ICD-10-CM | POA: Diagnosis not present

## 2023-10-28 DIAGNOSIS — Z7902 Long term (current) use of antithrombotics/antiplatelets: Secondary | ICD-10-CM | POA: Diagnosis not present

## 2023-10-28 DIAGNOSIS — I509 Heart failure, unspecified: Secondary | ICD-10-CM | POA: Diagnosis not present

## 2023-10-28 DIAGNOSIS — Z5982 Transportation insecurity: Secondary | ICD-10-CM | POA: Diagnosis not present

## 2023-10-28 DIAGNOSIS — M1990 Unspecified osteoarthritis, unspecified site: Secondary | ICD-10-CM | POA: Diagnosis not present

## 2023-10-28 DIAGNOSIS — J449 Chronic obstructive pulmonary disease, unspecified: Secondary | ICD-10-CM | POA: Diagnosis not present

## 2023-10-28 DIAGNOSIS — Z7985 Long-term (current) use of injectable non-insulin antidiabetic drugs: Secondary | ICD-10-CM | POA: Diagnosis not present

## 2023-10-28 DIAGNOSIS — Z556 Problems related to health literacy: Secondary | ICD-10-CM | POA: Diagnosis not present

## 2023-10-28 DIAGNOSIS — N182 Chronic kidney disease, stage 2 (mild): Secondary | ICD-10-CM | POA: Diagnosis not present

## 2023-10-28 DIAGNOSIS — E02 Subclinical iodine-deficiency hypothyroidism: Secondary | ICD-10-CM | POA: Diagnosis not present

## 2023-10-28 DIAGNOSIS — I2510 Atherosclerotic heart disease of native coronary artery without angina pectoris: Secondary | ICD-10-CM | POA: Diagnosis not present

## 2023-10-28 DIAGNOSIS — I130 Hypertensive heart and chronic kidney disease with heart failure and stage 1 through stage 4 chronic kidney disease, or unspecified chronic kidney disease: Secondary | ICD-10-CM | POA: Diagnosis not present

## 2023-10-28 DIAGNOSIS — I4891 Unspecified atrial fibrillation: Secondary | ICD-10-CM | POA: Diagnosis not present

## 2023-10-28 DIAGNOSIS — E1122 Type 2 diabetes mellitus with diabetic chronic kidney disease: Secondary | ICD-10-CM | POA: Diagnosis not present

## 2023-10-28 DIAGNOSIS — I872 Venous insufficiency (chronic) (peripheral): Secondary | ICD-10-CM | POA: Diagnosis not present

## 2023-10-31 DIAGNOSIS — M1990 Unspecified osteoarthritis, unspecified site: Secondary | ICD-10-CM | POA: Diagnosis not present

## 2023-10-31 DIAGNOSIS — I130 Hypertensive heart and chronic kidney disease with heart failure and stage 1 through stage 4 chronic kidney disease, or unspecified chronic kidney disease: Secondary | ICD-10-CM | POA: Diagnosis not present

## 2023-10-31 DIAGNOSIS — Z7902 Long term (current) use of antithrombotics/antiplatelets: Secondary | ICD-10-CM | POA: Diagnosis not present

## 2023-10-31 DIAGNOSIS — H6123 Impacted cerumen, bilateral: Secondary | ICD-10-CM | POA: Diagnosis not present

## 2023-10-31 DIAGNOSIS — N182 Chronic kidney disease, stage 2 (mild): Secondary | ICD-10-CM | POA: Diagnosis not present

## 2023-10-31 DIAGNOSIS — Z5982 Transportation insecurity: Secondary | ICD-10-CM | POA: Diagnosis not present

## 2023-10-31 DIAGNOSIS — I509 Heart failure, unspecified: Secondary | ICD-10-CM | POA: Diagnosis not present

## 2023-10-31 DIAGNOSIS — Z7985 Long-term (current) use of injectable non-insulin antidiabetic drugs: Secondary | ICD-10-CM | POA: Diagnosis not present

## 2023-10-31 DIAGNOSIS — L89220 Pressure ulcer of left hip, unstageable: Secondary | ICD-10-CM | POA: Diagnosis not present

## 2023-10-31 DIAGNOSIS — I2510 Atherosclerotic heart disease of native coronary artery without angina pectoris: Secondary | ICD-10-CM | POA: Diagnosis not present

## 2023-10-31 DIAGNOSIS — I4891 Unspecified atrial fibrillation: Secondary | ICD-10-CM | POA: Diagnosis not present

## 2023-10-31 DIAGNOSIS — E02 Subclinical iodine-deficiency hypothyroidism: Secondary | ICD-10-CM | POA: Diagnosis not present

## 2023-10-31 DIAGNOSIS — J449 Chronic obstructive pulmonary disease, unspecified: Secondary | ICD-10-CM | POA: Diagnosis not present

## 2023-10-31 DIAGNOSIS — S70922A Unspecified superficial injury of left thigh, initial encounter: Secondary | ICD-10-CM | POA: Diagnosis not present

## 2023-10-31 DIAGNOSIS — Z556 Problems related to health literacy: Secondary | ICD-10-CM | POA: Diagnosis not present

## 2023-10-31 DIAGNOSIS — Z7984 Long term (current) use of oral hypoglycemic drugs: Secondary | ICD-10-CM | POA: Diagnosis not present

## 2023-10-31 DIAGNOSIS — G250 Essential tremor: Secondary | ICD-10-CM | POA: Diagnosis not present

## 2023-10-31 DIAGNOSIS — E559 Vitamin D deficiency, unspecified: Secondary | ICD-10-CM | POA: Diagnosis not present

## 2023-10-31 DIAGNOSIS — Z87891 Personal history of nicotine dependence: Secondary | ICD-10-CM | POA: Diagnosis not present

## 2023-10-31 DIAGNOSIS — E1122 Type 2 diabetes mellitus with diabetic chronic kidney disease: Secondary | ICD-10-CM | POA: Diagnosis not present

## 2023-10-31 DIAGNOSIS — I872 Venous insufficiency (chronic) (peripheral): Secondary | ICD-10-CM | POA: Diagnosis not present

## 2023-11-01 DIAGNOSIS — L97128 Non-pressure chronic ulcer of left thigh with other specified severity: Secondary | ICD-10-CM | POA: Diagnosis not present

## 2023-11-03 DIAGNOSIS — E559 Vitamin D deficiency, unspecified: Secondary | ICD-10-CM | POA: Diagnosis not present

## 2023-11-03 DIAGNOSIS — I872 Venous insufficiency (chronic) (peripheral): Secondary | ICD-10-CM | POA: Diagnosis not present

## 2023-11-03 DIAGNOSIS — Z7902 Long term (current) use of antithrombotics/antiplatelets: Secondary | ICD-10-CM | POA: Diagnosis not present

## 2023-11-03 DIAGNOSIS — L89220 Pressure ulcer of left hip, unstageable: Secondary | ICD-10-CM | POA: Diagnosis not present

## 2023-11-03 DIAGNOSIS — M1990 Unspecified osteoarthritis, unspecified site: Secondary | ICD-10-CM | POA: Diagnosis not present

## 2023-11-03 DIAGNOSIS — Z556 Problems related to health literacy: Secondary | ICD-10-CM | POA: Diagnosis not present

## 2023-11-03 DIAGNOSIS — N182 Chronic kidney disease, stage 2 (mild): Secondary | ICD-10-CM | POA: Diagnosis not present

## 2023-11-03 DIAGNOSIS — Z7984 Long term (current) use of oral hypoglycemic drugs: Secondary | ICD-10-CM | POA: Diagnosis not present

## 2023-11-03 DIAGNOSIS — I2510 Atherosclerotic heart disease of native coronary artery without angina pectoris: Secondary | ICD-10-CM | POA: Diagnosis not present

## 2023-11-03 DIAGNOSIS — H6123 Impacted cerumen, bilateral: Secondary | ICD-10-CM | POA: Diagnosis not present

## 2023-11-03 DIAGNOSIS — J449 Chronic obstructive pulmonary disease, unspecified: Secondary | ICD-10-CM | POA: Diagnosis not present

## 2023-11-03 DIAGNOSIS — I4891 Unspecified atrial fibrillation: Secondary | ICD-10-CM | POA: Diagnosis not present

## 2023-11-03 DIAGNOSIS — Z7985 Long-term (current) use of injectable non-insulin antidiabetic drugs: Secondary | ICD-10-CM | POA: Diagnosis not present

## 2023-11-03 DIAGNOSIS — E1122 Type 2 diabetes mellitus with diabetic chronic kidney disease: Secondary | ICD-10-CM | POA: Diagnosis not present

## 2023-11-03 DIAGNOSIS — I130 Hypertensive heart and chronic kidney disease with heart failure and stage 1 through stage 4 chronic kidney disease, or unspecified chronic kidney disease: Secondary | ICD-10-CM | POA: Diagnosis not present

## 2023-11-03 DIAGNOSIS — E02 Subclinical iodine-deficiency hypothyroidism: Secondary | ICD-10-CM | POA: Diagnosis not present

## 2023-11-03 DIAGNOSIS — Z5982 Transportation insecurity: Secondary | ICD-10-CM | POA: Diagnosis not present

## 2023-11-03 DIAGNOSIS — G250 Essential tremor: Secondary | ICD-10-CM | POA: Diagnosis not present

## 2023-11-03 DIAGNOSIS — I509 Heart failure, unspecified: Secondary | ICD-10-CM | POA: Diagnosis not present

## 2023-11-03 DIAGNOSIS — Z87891 Personal history of nicotine dependence: Secondary | ICD-10-CM | POA: Diagnosis not present

## 2023-11-04 ENCOUNTER — Ambulatory Visit: Admitting: Podiatry

## 2023-11-07 DIAGNOSIS — Z7984 Long term (current) use of oral hypoglycemic drugs: Secondary | ICD-10-CM | POA: Diagnosis not present

## 2023-11-07 DIAGNOSIS — L89220 Pressure ulcer of left hip, unstageable: Secondary | ICD-10-CM | POA: Diagnosis not present

## 2023-11-07 DIAGNOSIS — I509 Heart failure, unspecified: Secondary | ICD-10-CM | POA: Diagnosis not present

## 2023-11-07 DIAGNOSIS — Z7985 Long-term (current) use of injectable non-insulin antidiabetic drugs: Secondary | ICD-10-CM | POA: Diagnosis not present

## 2023-11-07 DIAGNOSIS — I872 Venous insufficiency (chronic) (peripheral): Secondary | ICD-10-CM | POA: Diagnosis not present

## 2023-11-07 DIAGNOSIS — I2510 Atherosclerotic heart disease of native coronary artery without angina pectoris: Secondary | ICD-10-CM | POA: Diagnosis not present

## 2023-11-07 DIAGNOSIS — N182 Chronic kidney disease, stage 2 (mild): Secondary | ICD-10-CM | POA: Diagnosis not present

## 2023-11-07 DIAGNOSIS — G250 Essential tremor: Secondary | ICD-10-CM | POA: Diagnosis not present

## 2023-11-07 DIAGNOSIS — I4891 Unspecified atrial fibrillation: Secondary | ICD-10-CM | POA: Diagnosis not present

## 2023-11-07 DIAGNOSIS — M1990 Unspecified osteoarthritis, unspecified site: Secondary | ICD-10-CM | POA: Diagnosis not present

## 2023-11-07 DIAGNOSIS — I130 Hypertensive heart and chronic kidney disease with heart failure and stage 1 through stage 4 chronic kidney disease, or unspecified chronic kidney disease: Secondary | ICD-10-CM | POA: Diagnosis not present

## 2023-11-07 DIAGNOSIS — Z5982 Transportation insecurity: Secondary | ICD-10-CM | POA: Diagnosis not present

## 2023-11-07 DIAGNOSIS — H6123 Impacted cerumen, bilateral: Secondary | ICD-10-CM | POA: Diagnosis not present

## 2023-11-07 DIAGNOSIS — E1122 Type 2 diabetes mellitus with diabetic chronic kidney disease: Secondary | ICD-10-CM | POA: Diagnosis not present

## 2023-11-07 DIAGNOSIS — E559 Vitamin D deficiency, unspecified: Secondary | ICD-10-CM | POA: Diagnosis not present

## 2023-11-07 DIAGNOSIS — Z87891 Personal history of nicotine dependence: Secondary | ICD-10-CM | POA: Diagnosis not present

## 2023-11-07 DIAGNOSIS — E02 Subclinical iodine-deficiency hypothyroidism: Secondary | ICD-10-CM | POA: Diagnosis not present

## 2023-11-07 DIAGNOSIS — J449 Chronic obstructive pulmonary disease, unspecified: Secondary | ICD-10-CM | POA: Diagnosis not present

## 2023-11-07 DIAGNOSIS — Z556 Problems related to health literacy: Secondary | ICD-10-CM | POA: Diagnosis not present

## 2023-11-07 DIAGNOSIS — Z7902 Long term (current) use of antithrombotics/antiplatelets: Secondary | ICD-10-CM | POA: Diagnosis not present

## 2023-11-08 ENCOUNTER — Other Ambulatory Visit: Payer: Self-pay | Admitting: Internal Medicine

## 2023-11-08 ENCOUNTER — Encounter (HOSPITAL_BASED_OUTPATIENT_CLINIC_OR_DEPARTMENT_OTHER): Admitting: General Surgery

## 2023-11-08 DIAGNOSIS — E785 Hyperlipidemia, unspecified: Secondary | ICD-10-CM

## 2023-11-08 DIAGNOSIS — I10 Essential (primary) hypertension: Secondary | ICD-10-CM

## 2023-11-09 ENCOUNTER — Encounter (HOSPITAL_COMMUNITY): Payer: Self-pay

## 2023-11-09 ENCOUNTER — Other Ambulatory Visit: Payer: Self-pay

## 2023-11-09 ENCOUNTER — Inpatient Hospital Stay (HOSPITAL_COMMUNITY)
Admission: EM | Admit: 2023-11-09 | Discharge: 2023-11-15 | DRG: 638 | Disposition: A | Discharge status: Home / Self Care | Attending: Student | Admitting: Student

## 2023-11-09 DIAGNOSIS — K219 Gastro-esophageal reflux disease without esophagitis: Secondary | ICD-10-CM | POA: Diagnosis not present

## 2023-11-09 DIAGNOSIS — G4733 Obstructive sleep apnea (adult) (pediatric): Secondary | ICD-10-CM | POA: Diagnosis present

## 2023-11-09 DIAGNOSIS — E1151 Type 2 diabetes mellitus with diabetic peripheral angiopathy without gangrene: Secondary | ICD-10-CM | POA: Diagnosis not present

## 2023-11-09 DIAGNOSIS — I25119 Atherosclerotic heart disease of native coronary artery with unspecified angina pectoris: Secondary | ICD-10-CM | POA: Diagnosis not present

## 2023-11-09 DIAGNOSIS — Z886 Allergy status to analgesic agent status: Secondary | ICD-10-CM

## 2023-11-09 DIAGNOSIS — Z794 Long term (current) use of insulin: Secondary | ICD-10-CM | POA: Diagnosis not present

## 2023-11-09 DIAGNOSIS — Z6841 Body Mass Index (BMI) 40.0 and over, adult: Secondary | ICD-10-CM

## 2023-11-09 DIAGNOSIS — M869 Osteomyelitis, unspecified: Secondary | ICD-10-CM | POA: Diagnosis not present

## 2023-11-09 DIAGNOSIS — Z89512 Acquired absence of left leg below knee: Secondary | ICD-10-CM | POA: Diagnosis not present

## 2023-11-09 DIAGNOSIS — E1140 Type 2 diabetes mellitus with diabetic neuropathy, unspecified: Secondary | ICD-10-CM | POA: Diagnosis not present

## 2023-11-09 DIAGNOSIS — L89899 Pressure ulcer of other site, unspecified stage: Secondary | ICD-10-CM | POA: Diagnosis present

## 2023-11-09 DIAGNOSIS — L89220 Pressure ulcer of left hip, unstageable: Secondary | ICD-10-CM | POA: Diagnosis not present

## 2023-11-09 DIAGNOSIS — D649 Anemia, unspecified: Secondary | ICD-10-CM | POA: Diagnosis present

## 2023-11-09 DIAGNOSIS — E1165 Type 2 diabetes mellitus with hyperglycemia: Secondary | ICD-10-CM | POA: Diagnosis not present

## 2023-11-09 DIAGNOSIS — R251 Tremor, unspecified: Secondary | ICD-10-CM | POA: Diagnosis not present

## 2023-11-09 DIAGNOSIS — E11621 Type 2 diabetes mellitus with foot ulcer: Secondary | ICD-10-CM | POA: Diagnosis not present

## 2023-11-09 DIAGNOSIS — E46 Unspecified protein-calorie malnutrition: Secondary | ICD-10-CM | POA: Diagnosis present

## 2023-11-09 DIAGNOSIS — L89610 Pressure ulcer of right heel, unstageable: Secondary | ICD-10-CM | POA: Diagnosis not present

## 2023-11-09 DIAGNOSIS — I10 Essential (primary) hypertension: Secondary | ICD-10-CM | POA: Diagnosis not present

## 2023-11-09 DIAGNOSIS — Z7902 Long term (current) use of antithrombotics/antiplatelets: Secondary | ICD-10-CM | POA: Diagnosis not present

## 2023-11-09 DIAGNOSIS — Z955 Presence of coronary angioplasty implant and graft: Secondary | ICD-10-CM

## 2023-11-09 DIAGNOSIS — Z79899 Other long term (current) drug therapy: Secondary | ICD-10-CM

## 2023-11-09 DIAGNOSIS — J449 Chronic obstructive pulmonary disease, unspecified: Secondary | ICD-10-CM | POA: Diagnosis not present

## 2023-11-09 DIAGNOSIS — Z87891 Personal history of nicotine dependence: Secondary | ICD-10-CM

## 2023-11-09 DIAGNOSIS — E8809 Other disorders of plasma-protein metabolism, not elsewhere classified: Secondary | ICD-10-CM | POA: Diagnosis not present

## 2023-11-09 DIAGNOSIS — E11628 Type 2 diabetes mellitus with other skin complications: Principal | ICD-10-CM | POA: Diagnosis present

## 2023-11-09 DIAGNOSIS — Z833 Family history of diabetes mellitus: Secondary | ICD-10-CM

## 2023-11-09 DIAGNOSIS — M7731 Calcaneal spur, right foot: Secondary | ICD-10-CM | POA: Diagnosis not present

## 2023-11-09 DIAGNOSIS — E785 Hyperlipidemia, unspecified: Secondary | ICD-10-CM | POA: Diagnosis present

## 2023-11-09 DIAGNOSIS — L97519 Non-pressure chronic ulcer of other part of right foot with unspecified severity: Secondary | ICD-10-CM | POA: Diagnosis not present

## 2023-11-09 DIAGNOSIS — F39 Unspecified mood [affective] disorder: Secondary | ICD-10-CM | POA: Diagnosis present

## 2023-11-09 DIAGNOSIS — L89229 Pressure ulcer of left hip, unspecified stage: Secondary | ICD-10-CM | POA: Insufficient documentation

## 2023-11-09 DIAGNOSIS — Z8249 Family history of ischemic heart disease and other diseases of the circulatory system: Secondary | ICD-10-CM

## 2023-11-09 DIAGNOSIS — S91301A Unspecified open wound, right foot, initial encounter: Secondary | ICD-10-CM | POA: Diagnosis not present

## 2023-11-09 DIAGNOSIS — E66813 Obesity, class 3: Secondary | ICD-10-CM | POA: Diagnosis present

## 2023-11-09 NOTE — ED Triage Notes (Signed)
 Pov from home. Cc of painful pressure ulcer on bottom of right heel since Monday. Says it is painful.

## 2023-11-10 ENCOUNTER — Inpatient Hospital Stay (HOSPITAL_COMMUNITY)

## 2023-11-10 ENCOUNTER — Emergency Department (HOSPITAL_COMMUNITY)

## 2023-11-10 DIAGNOSIS — R251 Tremor, unspecified: Secondary | ICD-10-CM | POA: Insufficient documentation

## 2023-11-10 DIAGNOSIS — E11621 Type 2 diabetes mellitus with foot ulcer: Secondary | ICD-10-CM | POA: Diagnosis present

## 2023-11-10 DIAGNOSIS — D649 Anemia, unspecified: Secondary | ICD-10-CM | POA: Diagnosis not present

## 2023-11-10 DIAGNOSIS — L89610 Pressure ulcer of right heel, unstageable: Secondary | ICD-10-CM | POA: Diagnosis not present

## 2023-11-10 DIAGNOSIS — L97418 Non-pressure chronic ulcer of right heel and midfoot with other specified severity: Secondary | ICD-10-CM | POA: Diagnosis not present

## 2023-11-10 DIAGNOSIS — M869 Osteomyelitis, unspecified: Secondary | ICD-10-CM | POA: Diagnosis not present

## 2023-11-10 DIAGNOSIS — E1151 Type 2 diabetes mellitus with diabetic peripheral angiopathy without gangrene: Secondary | ICD-10-CM | POA: Diagnosis present

## 2023-11-10 DIAGNOSIS — L97411 Non-pressure chronic ulcer of right heel and midfoot limited to breakdown of skin: Secondary | ICD-10-CM

## 2023-11-10 DIAGNOSIS — I2510 Atherosclerotic heart disease of native coronary artery without angina pectoris: Secondary | ICD-10-CM | POA: Diagnosis not present

## 2023-11-10 DIAGNOSIS — Z7902 Long term (current) use of antithrombotics/antiplatelets: Secondary | ICD-10-CM | POA: Diagnosis not present

## 2023-11-10 DIAGNOSIS — L89229 Pressure ulcer of left hip, unspecified stage: Secondary | ICD-10-CM | POA: Insufficient documentation

## 2023-11-10 DIAGNOSIS — E8809 Other disorders of plasma-protein metabolism, not elsewhere classified: Secondary | ICD-10-CM | POA: Diagnosis not present

## 2023-11-10 DIAGNOSIS — G4733 Obstructive sleep apnea (adult) (pediatric): Secondary | ICD-10-CM | POA: Diagnosis present

## 2023-11-10 DIAGNOSIS — E1165 Type 2 diabetes mellitus with hyperglycemia: Secondary | ICD-10-CM | POA: Diagnosis not present

## 2023-11-10 DIAGNOSIS — R600 Localized edema: Secondary | ICD-10-CM | POA: Diagnosis not present

## 2023-11-10 DIAGNOSIS — I7102 Dissection of abdominal aorta: Secondary | ICD-10-CM | POA: Diagnosis not present

## 2023-11-10 DIAGNOSIS — E66813 Obesity, class 3: Secondary | ICD-10-CM

## 2023-11-10 DIAGNOSIS — S91301A Unspecified open wound, right foot, initial encounter: Secondary | ICD-10-CM | POA: Diagnosis not present

## 2023-11-10 DIAGNOSIS — Z89512 Acquired absence of left leg below knee: Secondary | ICD-10-CM | POA: Diagnosis not present

## 2023-11-10 DIAGNOSIS — I10 Essential (primary) hypertension: Secondary | ICD-10-CM | POA: Diagnosis not present

## 2023-11-10 DIAGNOSIS — Z89522 Acquired absence of left knee: Secondary | ICD-10-CM | POA: Diagnosis not present

## 2023-11-10 DIAGNOSIS — E46 Unspecified protein-calorie malnutrition: Secondary | ICD-10-CM

## 2023-11-10 DIAGNOSIS — Z794 Long term (current) use of insulin: Secondary | ICD-10-CM

## 2023-11-10 DIAGNOSIS — L89220 Pressure ulcer of left hip, unstageable: Secondary | ICD-10-CM | POA: Diagnosis not present

## 2023-11-10 DIAGNOSIS — I25119 Atherosclerotic heart disease of native coronary artery with unspecified angina pectoris: Secondary | ICD-10-CM | POA: Diagnosis not present

## 2023-11-10 DIAGNOSIS — E785 Hyperlipidemia, unspecified: Secondary | ICD-10-CM | POA: Diagnosis present

## 2023-11-10 DIAGNOSIS — J449 Chronic obstructive pulmonary disease, unspecified: Secondary | ICD-10-CM | POA: Diagnosis not present

## 2023-11-10 DIAGNOSIS — L89899 Pressure ulcer of other site, unspecified stage: Secondary | ICD-10-CM | POA: Diagnosis not present

## 2023-11-10 DIAGNOSIS — M7731 Calcaneal spur, right foot: Secondary | ICD-10-CM | POA: Diagnosis not present

## 2023-11-10 DIAGNOSIS — K219 Gastro-esophageal reflux disease without esophagitis: Secondary | ICD-10-CM | POA: Diagnosis not present

## 2023-11-10 DIAGNOSIS — L97909 Non-pressure chronic ulcer of unspecified part of unspecified lower leg with unspecified severity: Secondary | ICD-10-CM | POA: Diagnosis not present

## 2023-11-10 DIAGNOSIS — L97419 Non-pressure chronic ulcer of right heel and midfoot with unspecified severity: Secondary | ICD-10-CM | POA: Diagnosis not present

## 2023-11-10 DIAGNOSIS — I714 Abdominal aortic aneurysm, without rupture, unspecified: Secondary | ICD-10-CM | POA: Diagnosis not present

## 2023-11-10 DIAGNOSIS — Z6841 Body Mass Index (BMI) 40.0 and over, adult: Secondary | ICD-10-CM | POA: Diagnosis not present

## 2023-11-10 DIAGNOSIS — F39 Unspecified mood [affective] disorder: Secondary | ICD-10-CM | POA: Diagnosis present

## 2023-11-10 DIAGNOSIS — L97415 Non-pressure chronic ulcer of right heel and midfoot with muscle involvement without evidence of necrosis: Secondary | ICD-10-CM | POA: Diagnosis not present

## 2023-11-10 DIAGNOSIS — E1140 Type 2 diabetes mellitus with diabetic neuropathy, unspecified: Secondary | ICD-10-CM | POA: Diagnosis not present

## 2023-11-10 DIAGNOSIS — L97412 Non-pressure chronic ulcer of right heel and midfoot with fat layer exposed: Secondary | ICD-10-CM | POA: Diagnosis not present

## 2023-11-10 DIAGNOSIS — E11628 Type 2 diabetes mellitus with other skin complications: Secondary | ICD-10-CM | POA: Diagnosis not present

## 2023-11-10 DIAGNOSIS — Z955 Presence of coronary angioplasty implant and graft: Secondary | ICD-10-CM | POA: Diagnosis not present

## 2023-11-10 LAB — COMPREHENSIVE METABOLIC PANEL WITH GFR
ALT: 12 U/L (ref 0–44)
AST: 19 U/L (ref 15–41)
Albumin: 3.2 g/dL — ABNORMAL LOW (ref 3.5–5.0)
Alkaline Phosphatase: 77 U/L (ref 38–126)
Anion gap: 10 (ref 5–15)
BUN: 18 mg/dL (ref 6–20)
CO2: 28 mmol/L (ref 22–32)
Calcium: 9.1 mg/dL (ref 8.9–10.3)
Chloride: 100 mmol/L (ref 98–111)
Creatinine, Ser: 0.78 mg/dL (ref 0.44–1.00)
GFR, Estimated: >60 mL/min (ref >60)
Glucose, Bld: 389 mg/dL — ABNORMAL HIGH (ref 70–99)
Potassium: 4.2 mmol/L (ref 3.5–5.1)
Sodium: 138 mmol/L (ref 135–145)
Total Bilirubin: 0.5 mg/dL (ref 0.0–1.2)
Total Protein: 6.7 g/dL (ref 6.5–8.1)

## 2023-11-10 LAB — CBC WITH DIFFERENTIAL/PLATELET
Abs Immature Granulocytes: 0.03 K/uL (ref 0.00–0.07)
Basophils Absolute: 0.1 K/uL (ref 0.0–0.1)
Basophils Relative: 1 %
Eosinophils Absolute: 0.1 K/uL (ref 0.0–0.5)
Eosinophils Relative: 1 %
HCT: 35.5 % — ABNORMAL LOW (ref 36.0–46.0)
Hemoglobin: 11.3 g/dL — ABNORMAL LOW (ref 12.0–15.0)
Immature Granulocytes: 0 %
Lymphocytes Relative: 39 %
Lymphs Abs: 3 K/uL (ref 0.7–4.0)
MCH: 30.1 pg (ref 26.0–34.0)
MCHC: 31.8 g/dL (ref 30.0–36.0)
MCV: 94.4 fL (ref 80.0–100.0)
Monocytes Absolute: 0.7 K/uL (ref 0.1–1.0)
Monocytes Relative: 9 %
Neutro Abs: 3.8 K/uL (ref 1.7–7.7)
Neutrophils Relative %: 50 %
Platelets: 158 K/uL (ref 150–400)
RBC: 3.76 MIL/uL — ABNORMAL LOW (ref 3.87–5.11)
RDW: 13.1 % (ref 11.5–15.5)
WBC: 7.6 K/uL (ref 4.0–10.5)
nRBC: 0 % (ref 0.0–0.2)

## 2023-11-10 LAB — CBG MONITORING, ED
Glucose-Capillary: 309 mg/dL — ABNORMAL HIGH (ref 70–99)
Glucose-Capillary: 197 mg/dL — ABNORMAL HIGH (ref 70–99)
Glucose-Capillary: 227 mg/dL — ABNORMAL HIGH (ref 70–99)

## 2023-11-10 LAB — HEMOGLOBIN A1C
Hgb A1c MFr Bld: 13.2 % — ABNORMAL HIGH (ref 4.8–5.6)
Mean Plasma Glucose: 332.14 mg/dL

## 2023-11-10 LAB — GLUCOSE, CAPILLARY
Glucose-Capillary: 207 mg/dL — ABNORMAL HIGH (ref 70–99)
Glucose-Capillary: 257 mg/dL — ABNORMAL HIGH (ref 70–99)

## 2023-11-10 MED ORDER — GLUCERNA SHAKE PO LIQD
237.0000 mL | Freq: Three times a day (TID) | ORAL | Status: DC
  Administered 2023-11-10 – 2023-11-15 (×13): 237 mL via ORAL
  Filled 2023-11-10 (×6): qty 237

## 2023-11-10 MED ORDER — PIPERACILLIN-TAZOBACTAM 3.375 G IVPB
3.3750 g | Freq: Three times a day (TID) | INTRAVENOUS | Status: DC
  Administered 2023-11-10 – 2023-11-13 (×10): 3.375 g via INTRAVENOUS
  Filled 2023-11-10 (×9): qty 50

## 2023-11-10 MED ORDER — OXYCODONE HCL 5 MG PO TABS
5.0000 mg | ORAL_TABLET | Freq: Four times a day (QID) | ORAL | Status: DC | PRN
  Administered 2023-11-10 – 2023-11-13 (×6): 5 mg via ORAL
  Filled 2023-11-10 (×6): qty 1

## 2023-11-10 MED ORDER — PIPERACILLIN-TAZOBACTAM 3.375 G IVPB 30 MIN
3.3750 g | Freq: Once | INTRAVENOUS | Status: AC
  Administered 2023-11-10: 3.375 g via INTRAVENOUS
  Filled 2023-11-10: qty 50

## 2023-11-10 MED ORDER — HYDROMORPHONE HCL 1 MG/ML IJ SOLN
1.0000 mg | INTRAMUSCULAR | Status: DC | PRN
  Administered 2023-11-10 – 2023-11-14 (×6): 1 mg via INTRAVENOUS
  Filled 2023-11-10 (×6): qty 1

## 2023-11-10 MED ORDER — INSULIN ASPART 100 UNIT/ML IJ SOLN
0.0000 [IU] | INTRAMUSCULAR | Status: DC
  Administered 2023-11-10 (×2): 5 [IU] via SUBCUTANEOUS
  Administered 2023-11-10: 11 [IU] via SUBCUTANEOUS
  Administered 2023-11-10 – 2023-11-11 (×2): 3 [IU] via SUBCUTANEOUS
  Administered 2023-11-11: 8 [IU] via SUBCUTANEOUS
  Administered 2023-11-11: 5 [IU] via SUBCUTANEOUS
  Administered 2023-11-11: 8 [IU] via SUBCUTANEOUS
  Filled 2023-11-10 (×3): qty 1

## 2023-11-10 MED ORDER — VANCOMYCIN HCL 1500 MG/300ML IV SOLN
1500.0000 mg | INTRAVENOUS | Status: DC
  Administered 2023-11-10 – 2023-11-12 (×3): 1500 mg via INTRAVENOUS
  Filled 2023-11-10 (×3): qty 300

## 2023-11-10 MED ORDER — GADOBUTROL 1 MMOL/ML IV SOLN
10.0000 mL | Freq: Once | INTRAVENOUS | Status: AC | PRN
  Administered 2023-11-10: 10 mL via INTRAVENOUS

## 2023-11-10 MED ORDER — PANTOPRAZOLE SODIUM 40 MG PO TBEC
40.0000 mg | DELAYED_RELEASE_TABLET | Freq: Every day | ORAL | Status: DC
  Administered 2023-11-10 – 2023-11-15 (×6): 40 mg via ORAL
  Filled 2023-11-10 (×6): qty 1

## 2023-11-10 MED ORDER — NYSTATIN 100000 UNIT/GM EX POWD
Freq: Two times a day (BID) | CUTANEOUS | Status: DC
  Filled 2023-11-10 (×4): qty 15

## 2023-11-10 MED ORDER — CLOPIDOGREL BISULFATE 75 MG PO TABS
75.0000 mg | ORAL_TABLET | Freq: Every day | ORAL | Status: DC
Start: 2023-11-10 — End: 2023-11-10
  Administered 2023-11-10: 75 mg via ORAL
  Filled 2023-11-10: qty 1

## 2023-11-10 MED ORDER — CLOPIDOGREL BISULFATE 75 MG PO TABS
75.0000 mg | ORAL_TABLET | Freq: Every day | ORAL | Status: DC
Start: 2023-11-12 — End: 2023-11-11

## 2023-11-10 MED ORDER — VANCOMYCIN HCL IN DEXTROSE 1-5 GM/200ML-% IV SOLN
1000.0000 mg | Freq: Once | INTRAVENOUS | Status: AC
  Administered 2023-11-10: 1000 mg via INTRAVENOUS
  Filled 2023-11-10: qty 200

## 2023-11-10 MED ORDER — ROSUVASTATIN CALCIUM 20 MG PO TABS
40.0000 mg | ORAL_TABLET | Freq: Every day | ORAL | Status: DC
  Administered 2023-11-10 – 2023-11-14 (×5): 40 mg via ORAL
  Filled 2023-11-10 (×5): qty 2

## 2023-11-10 NOTE — Progress Notes (Addendum)
 PROGRESS NOTE    Patient: Nicole [name]                            PCP: Shona Norleen PEDLAR, MD                    DOB: 1965/07/12            DOA: 11/09/2023 FMW:[id number]             DOS: 11/10/2023, 10:25 AM   LOS: 0 days   Date of Service: The patient was seen and examined on 11/10/2023  Subjective:   The patient was seen and examined this morning. Hemodynamically stable. No issues overnight .  Brief Narrative:   Nicole [name] is a 58 y.o. female with medical history significant of HTN, HLD GERD, CAD, T2DM, tremors, left BKA who presents to the emergency department due to right heel wound.  Patient endorsed several weeks of puslike fluid, and swollen right heel, this worsened within the last 2 days and the swelling burst resulting in a right heel wound.  She complained of pain which is worse on bearing weight on the right heel, so she presented to the ED for further evaluation and management.   ED Course: BP was 140/70, pulse was 70 bpm, temperature 99.9 F, respiratory rate 17/min, O2 sat 94% on room air.   - normocytic anemia.  BMP was normal except for blood glucose of 389, albumin 3.2. Her right foot x-ray showed no findings to suggest osteomyelitis. Patient was treated with vancomycin  and Zosyn  Podiatrist on-call (Dr. Malvin) was consulted and recommended MRI with contrast of right foot and to admit patient to [facility] for possible need for debridement. TRH was asked admit patient.    Assessment & Plan:   Principal Problem:   Diabetic ulcer of right heel associated with type 2 diabetes mellitus (HCC) Active Problems:   Coronary artery disease involving native coronary artery of native heart with angina pectoris (HCC)   Obesity, Class III, BMI 40-49.9 (morbid obesity)   Uncontrolled type 2 diabetes mellitus with hyperglycemia, with long-term current use of insulin  (HCC)   Hypoalbuminemia due to protein-calorie malnutrition (HCC)   Gastroesophageal reflux disease without  esophagitis   Pressure ulcer of left thigh   Tremor    Diabetic ulcer of right heel associated with type 2 diabetes mellitus - Hemodynamics stable, afebrile, normotensive, no leukocytosis -Continue current IV antibiotics of vancomycin  and Zosyn   Podiatrist on-call (Dr. Malvin) was consulted and recommended admitting patient to [facility] for possible debridement Patient will be kept n.p.o. in anticipation for possible surgical procedure in the morning. - Pending MRI of the right foot  Left thigh pressure ulcer Continue wound care   Hypoalbuminemia  -possibly secondary to mild protein calorie malnutrition Albumin 3.2, protein supplement will be provided   Tremor Daughter at bedside endorsed almost 1 year history of tremor which has not been investigated Will Consider neurology consult prior to discharge   Obesity class III  Morbid obesity Body mass index is 43.25 kg/m.  Patient was counseled about the cardiovascular and metabolic risk of morbid obesity. Patient was counseled for diet control, exercise regimen and weight loss.    GERD Continue Protonix    CAD Continue Plavix  and statin when patient resumes oral intake   Type 2 diabetes mellitus with hyperglycemia Hemoglobin A1c on 03/10/2023 was 13.5 CBG (last 3)  Recent Labs    11/10/23 9193  GLUCAP 309*   Continue ISS and hypoglycemia protocol     --------------------------------------------------------------------------------------------------------------------- Nutritional status:  The patient's BMI is: Body mass index is 43.25 kg/m. I agree with the assessment and plan as outlined   Skin Assessment: I have examined the patient's skin and I agree with the wound assessment as performed by wound care team As outlined belowe: Wound 11/10/23 0337 Pressure Injury Hip Left;Lateral (Active)    ---------------------------------------------------------------------------------------------------------------------  DVT prophylaxis:  Considering heparin -pending surgical intervention   Code Status:   Code Status: Prior  Family Communication: No family member present at bedside-  -Advance care planning has been discussed.   Admission status:   Status is: Inpatient Remains inpatient appropriate because: Needing IV antibiotics for osteomyelitis, surgical evaluation   Disposition: From  - home             Planning for discharge in 1-2 days   Procedures:   No admission procedures for hospital encounter.   Antimicrobials:  Anti-infectives (From admission, onward)    Start     Dose/Rate Route Frequency Ordered Stop   11/10/23 1600  vancomycin  (VANCOREADY) IVPB 1500 mg/300 mL        1,500 mg 150 mL/hr over 120 Minutes Intravenous Every 24 hours 11/10/23 0559     11/10/23 0900  piperacillin -tazobactam (ZOSYN ) IVPB 3.375 g        3.375 g 12.5 mL/hr over 240 Minutes Intravenous Every 8 hours 11/10/23 0559     11/10/23 0145  piperacillin -tazobactam (ZOSYN ) IVPB 3.375 g        3.375 g 100 mL/hr over 30 Minutes Intravenous  Once 11/10/23 0133 11/10/23 0410   11/10/23 0130  vancomycin  (VANCOCIN ) IVPB 1000 mg/200 mL premix        1,000 mg 200 mL/hr over 60 Minutes Intravenous  Once 11/10/23 0115 11/10/23 0308        Medication:   clopidogrel   75 mg Oral Daily   feeding supplement (GLUCERNA SHAKE)  237 mL Oral TID BM   insulin  aspart  0-15 Units Subcutaneous Q4H   pantoprazole   40 mg Oral Daily   rosuvastatin   40 mg Oral QHS    HYDROmorphone  (DILAUDID ) injection, oxyCODONE    Objective:   Vitals:   11/10/23 0700 11/10/23 0800 11/10/23 0930 11/10/23 1000  BP: (!) 130/34 (!) 133/29 (!) 149/55 (!) 153/31  Pulse: 64 64 (!) 58 67  Resp: 18 19 19 18   Temp:      TempSrc:      SpO2: 95% 98% 95% 96%  Weight:      Height:       No intake or output data in the 24 hours  ending 11/10/23 1025 Filed Weights   11/09/23 2340  Weight: 114.3 kg     Physical examination:   General:  AAO x 3,  cooperative, no distress;   HEENT:  Normocephalic, PERRL, otherwise with in Normal limits   Neuro:  CNII-XII intact. , normal motor and sensation, reflexes intact   Lungs:   Clear to auscultation BL, Respirations unlabored,  No wheezes / crackles  Cardio:    S1/S2, RRR, No murmure, No Rubs or Gallops   Abdomen:  Soft, non-tender, bowel sounds active all four quadrants, no guarding or peritoneal signs.  Muscular  skeletal:  Left BKA Right heel-wound, with eschar, Limited exam -global generalized weaknesses - in bed, able to move all 4 extremities,   2+ pulses,  symmetric, No pitting edema  Skin:  Dry, warm to touch, negative for any Rashes,  Wounds: Please see nursing documentation          Wound 11/10/23 0337 Pressure Injury Hip Left;Lateral (Active)       ------------------------------------------------------------------------------------------------------------------------------------------    LABs:     Latest Ref Rng & Units 11/10/2023    1:37 AM 08/19/2023   10:40 PM 12/13/2021    5:57 AM  CBC  WBC 4.0 - 10.5 K/uL 7.6  8.0  10.9   Hemoglobin 12.0 - 15.0 g/dL 88.6  87.4  89.3   Hematocrit 36.0 - 46.0 % 35.5  38.3  31.7   Platelets 150 - 400 K/uL 158  161  181       Latest Ref Rng & Units 11/10/2023    1:37 AM 08/19/2023   10:40 PM 01/11/2022    2:41 PM  CMP  Glucose 70 - 99 mg/dL 610  846  576   BUN 6 - 20 mg/dL 18  18  34   Creatinine 0.44 - 1.00 mg/dL 9.21  9.12  8.59   Sodium 135 - 145 mmol/L 138  139  136   Potassium 3.5 - 5.1 mmol/L 4.2  3.6  3.8   Chloride 98 - 111 mmol/L 100  102  98   CO2 22 - 32 mmol/L 28  27  26    Calcium  8.9 - 10.3 mg/dL 9.1  9.5  9.2   Total Protein 6.5 - 8.1 g/dL 6.7  6.9    Total Bilirubin 0.0 - 1.2 mg/dL 0.5  0.8    Alkaline Phos 38 - 126 U/L 77  65    AST 15 - 41 U/L 19  20    ALT 0 - 44 U/L 12  13          Micro Results Recent Results (from the past 240 hours)  Blood culture (routine x 2)     Status: None (Preliminary result)   Collection Time: 11/10/23  1:37 AM   Specimen: BLOOD  Result Value Ref Range Status   Specimen Description BLOOD BLOOD RIGHT ARM  Final   Special Requests   Final    BOTTLES DRAWN AEROBIC AND ANAEROBIC Blood Culture adequate volume   Culture   Final    NO GROWTH < 12 HOURS Performed at [facility], [address] [place], [place] [address]    Report Status PENDING  Incomplete  Blood culture (routine x 2)     Status: None (Preliminary result)   Collection Time: 11/10/23  1:39 AM   Specimen: BLOOD  Result Value Ref Range Status   Specimen Description BLOOD RIGHT ANTECUBITAL  Final   Special Requests   Final    BOTTLES DRAWN AEROBIC AND ANAEROBIC Blood Culture adequate volume   Culture   Final    NO GROWTH < 12 HOURS Performed at [facility], [address] [place], [place] [address]    Report Status PENDING  Incomplete    Radiology Reports DG Foot Complete Right Result Date: 11/10/2023 CLINICAL DATA:  Soft tissue wound over the heel, evaluate for underlying osteomyelitis EXAM: RIGHT FOOT COMPLETE - 3+ VIEW COMPARISON:  07/01/2022 FINDINGS: No acute fracture or dislocation is noted. No soft tissue abnormality is seen. Calcaneal spurring is noted. The skin sloughing seen on physical exam is noted over the heel. No underlying erosive changes are seen to suggest osteomyelitis. IMPRESSION: No findings to suggest osteomyelitis. Electronically Signed   By: Oneil Devonshire M.D.   On: 11/10/2023 01:38    SIGNED: Adriana DELENA Grams, MD, FHM. FAAFP. [facility] Time spent - 55 min.  In seeing, evaluating and examining the patient. Reviewing medical records, labs, drawn plan of care. [facility],  Pager (please use [email or web address] to page/ text) Please use Epic Secure Chat for non-urgent communication (7AM-7PM)  If 7PM-7AM, please contact  night-coverage [email or web address], 11/10/2023, 10:25 AM

## 2023-11-10 NOTE — ED Notes (Signed)
Pt given diet soda per request 

## 2023-11-10 NOTE — Hospital Course (Signed)
 Nicole [name] is a 58 y.o. female with medical history significant of HTN, HLD GERD, CAD, T2DM, tremors, left BKA who presents to the emergency department due to right heel wound.  Patient endorsed several weeks of puslike fluid, and swollen right heel, this worsened within the last 2 days and the swelling burst resulting in a right heel wound.  She complained of pain which is worse on bearing weight on the right heel, so she presented to the ED for further evaluation and management.   ED Course: BP was 140/70, pulse was 70 bpm, temperature 99.9 F, respiratory rate 17/min, O2 sat 94% on room air.   - normocytic anemia.  BMP was normal except for blood glucose of 389, albumin 3.2. Her right foot x-ray showed no findings to suggest osteomyelitis. Patient was treated with vancomycin  and Zosyn  Podiatrist on-call (Dr. Malvin) was consulted and recommended MRI with contrast of right foot and to admit patient to [facility] for possible need for debridement. [facility] was asked admit patient.

## 2023-11-10 NOTE — ED Notes (Signed)
 Admitting  at bedside.

## 2023-11-10 NOTE — ED Notes (Signed)
 Patient transported to MRI

## 2023-11-10 NOTE — ED Provider Notes (Signed)
 [place] EMERGENCY DEPARTMENT AT [facility] Provider Note   CSN: [id number] Arrival date & time: 11/09/23  2318     History Chief Complaint  Patient presents with   Wound Check    HPI Nicole [name] is a 58 y.o. female presenting for chief complaint of right heel lesion. Progressive over the past few weeks. The entire leg started swelling and hurting over the past few days. BS in 300s.    Patient's recorded medical, surgical, social, medication list and allergies were reviewed in the Snapshot window as part of the initial history.   Review of Systems   Review of Systems  Constitutional:  Negative for chills and fever.  HENT:  Negative for ear pain and sore throat.   Eyes:  Negative for pain and visual disturbance.  Respiratory:  Negative for cough and shortness of breath.   Cardiovascular:  Negative for chest pain and palpitations.  Gastrointestinal:  Negative for abdominal pain and vomiting.  Genitourinary:  Negative for dysuria and hematuria.  Musculoskeletal:  Negative for arthralgias and back pain.  Skin:  Positive for color change and wound. Negative for rash.  Neurological:  Negative for seizures and syncope.  All other systems reviewed and are negative.   Physical Exam Updated Vital Signs BP (!) 101/51 (BP Location: Left Arm)   Pulse (!) 59   Temp 98.1 F (36.7 C) (Oral)   Resp (!) 22   Ht 5' 4 (1.626 m)   Wt 114.3 kg   SpO2 98%   BMI 43.25 kg/m  Physical Exam Vitals and nursing note reviewed.  Constitutional:      General: She is not in acute distress.    Appearance: She is well-developed.  HENT:     Head: Normocephalic and atraumatic.  Eyes:     Conjunctiva/sclera: Conjunctivae normal.  Cardiovascular:     Rate and Rhythm: Normal rate and regular rhythm.     Heart sounds: No murmur heard. Pulmonary:     Effort: Pulmonary effort is normal. No respiratory distress.     Breath sounds: Normal breath sounds.  Abdominal:     General:  There is no distension.     Palpations: Abdomen is soft.     Tenderness: There is no abdominal tenderness. There is no right CVA tenderness or left CVA tenderness.  Musculoskeletal:        General: Deformity and signs of injury present. No swelling or tenderness. Normal range of motion.     Cervical back: Neck supple.  Skin:    General: Skin is warm and dry.  Neurological:     General: No focal deficit present.     Mental Status: She is alert and oriented to person, place, and time. Mental status is at baseline.     Cranial Nerves: No cranial nerve deficit.      ED Course/ Medical Decision Making/ A&P    Procedures Procedures   Medications Ordered in ED Medications  vancomycin  (VANCOCIN ) IVPB 1000 mg/200 mL premix (0 mg Intravenous Stopped 11/10/23 0308)  piperacillin -tazobactam (ZOSYN ) IVPB 3.375 g (0 g Intravenous Stopped 11/10/23 0410)    Medical Decision Making:   This is a 58 year old female presenting with a foot ulcer of her right heel.  History of BKA on the left side for other wound.  Family states has been rapidly progressive over the past 72 hours.  She is having redness and swelling of the entire left foot.  Macerated, discharging with substantial surrounding erythema.  Patient has  no sensation of the extremity. X-ray performed shows no evidence of acute osteomyelitis, systemic blood work without evidence of acute pathology.  Patient started on broad-spectrum antibiotics for diabetic foot ulcer.  Consulted patient's podiatrist who is fortunately on-call tonight.  He recommended MRI with contrast and transfer to tertiary care center with operative capability for possible need for debridement depending on development throughout the day today. Consulted with hospitalist to arrange this admission over to [facility].  Clinical Impression:  1. Diabetic ulcer of right heel associated with type 2 diabetes mellitus, with other ulcer severity (HCC)      Admit   Final  Clinical Impression(s) / ED Diagnoses Final diagnoses:  Diabetic ulcer of right heel associated with type 2 diabetes mellitus, with other ulcer severity [facility])    Rx / DC Orders ED Discharge Orders     None         Jerral Meth, MD 11/10/23 [phone]

## 2023-11-10 NOTE — H&P (Signed)
 History and Physical    Patient: Nicole [name] FMW:[id number] DOB: 1965-12-16 DOA: 11/09/2023 DOS: the patient was seen and examined on 11/10/2023 PCP: Shona Norleen PEDLAR, MD  Patient coming from: Home  Chief Complaint:  Chief Complaint  Patient presents with   Wound Check   HPI: Nicole [name] is a 58 y.o. female with medical history significant of hypertension, hyperlipidemia, GERD, CAD, T2DM, tremors, left BKA who presents to the emergency department due to right heel wound.  Patient endorsed several weeks of puslike field and swollen right heel, this worsened within the last 2 days and the swelling burst resulting in a right heel wound.  She complained of pain which is worse on bearing weight on the right heel, so she presented to the ED for further evaluation and management.  ED Course:  In the emergency department, BP was 140/70, pulse was 70 bpm, temperature 99.9 F, respiratory rate 17/min, O2 sat 94% on room air.  Workup in ED showed normocytic anemia.  BMP was normal except for blood glucose of 389, albumin 3.2. Her right foot x-ray showed no findings to suggest osteomyelitis. Patient was treated with vancomycin  and Zosyn  Podiatrist on-call (Dr. Malvin) was consulted and recommended MRI with contrast of right foot and to admit patient to [facility] for possible need for debridement. [facility] was asked admit patient.  Review of Systems: Review of systems as noted in the HPI. All other systems reviewed and are negative.   Past Medical History:  Diagnosis Date   CAP (community acquired pneumonia)    Streptococcus 01/2011   COPD (chronic obstructive pulmonary disease) (HCC)    Coronary atherosclerosis of native coronary artery    a. Diagnosed [place]  2006 - DES RCA, reports followup cath 2009 at [facility] b. reported 2 stents in [place]  in 2020. c. 07/2021: cath showing patent stents along RCA and mid-LAD with scattered 20% stenosis but no obstructive disease.   Dyslipidemia     Essential hypertension, benign    Morbid obesity (HCC)    Pancreatitis    Type 2 diabetes mellitus (HCC)    Past Surgical History:  Procedure Laterality Date   ABDOMINAL HERNIA REPAIR     ABDOMINAL HYSTERECTOMY     AMPUTATION Left 01/25/2020   Procedure: LEFT BELOW KNEE AMPUTATION;  Surgeon: Harden Jerona GAILS, MD;  Location: [facility] OR;  Service: Orthopedics;  Laterality: Left;   CORONARY ANGIOPLASTY WITH STENT PLACEMENT  2006   KNEE SURGERY     LEFT HEART CATH AND CORONARY ANGIOGRAPHY N/A 08/14/2021   Procedure: LEFT HEART CATH AND CORONARY ANGIOGRAPHY;  Surgeon: Burnard Debby LABOR, MD;  Location: [facility] INVASIVE CV LAB;  Service: Cardiovascular;  Laterality: N/A;   NOSE SURGERY     Pilonidal cystectomy     TONSILLECTOMY      Social History:  reports that she quit smoking about 3 years ago. Her smoking use included cigarettes. She started smoking about 23 years ago. She has a 10 pack-year smoking history. She has never used smokeless tobacco. She reports that she does not drink alcohol and does not use drugs.   Allergies  Allergen Reactions   Aspirin  Anaphylaxis and Other (See Comments)    Throat closing    Bee Venom Anaphylaxis   Pineapple Anaphylaxis   Definity  [Perflutren  Lipid Microsphere]     Muscle Aches   E-Mycin [Erythromycin Base] Nausea And Vomiting    Family History  Problem Relation Age of Onset   Diabetes Mother    Heart failure Mother  Hypertension Mother    Kidney failure Mother    Ovarian cancer Mother    Asthma Son      Prior to Admission medications   Medication Sig Start Date End Date Taking? Authorizing Provider  Accu-Chek Softclix Lancets lancets Use as instructed to monitor glucose 4 times daily- use as backup to Dexcom 03/10/23   Therisa Benton PARAS, NP  acetaminophen  (TYLENOL ) 500 MG tablet Take 1 tablet (500 mg total) by mouth every 6 (six) hours as needed for moderate pain. 02/05/20   Amin, Ankit C, MD  albuterol  (PROVENTIL ) (2.5 MG/3ML) 0.083% nebulizer  solution Take 3 mLs (2.5 mg total) by nebulization every 6 (six) hours as needed for wheezing. 07/30/20   Elnor Lauraine BRAVO, NP  albuterol  (VENTOLIN  HFA) 108 (90 Base) MCG/ACT inhaler Inhale 2 puffs into the lungs every 6 (six) hours as needed for wheezing. Shortness of breath 07/30/20   Elnor Lauraine BRAVO, NP  amoxicillin -clavulanate (AUGMENTIN ) 500-125 MG tablet Take 1 tablet by mouth every 8 (eight) hours. 08/20/23   Geroldine Berg, MD  blood glucose meter kit and supplies Dispense based on patient and insurance preference. Use four times daily as directed. (FOR ICD-10 E10.9, E11.9). 01/09/21   Therisa Benton PARAS, NP  clopidogrel  (PLAVIX ) 75 MG tablet Take 1 tablet by mouth once daily 11/09/23   Okey Vina GAILS, MD  EASY TOUCH INSULIN  SYRINGE 31G X 5/16 1 ML MISC  09/21/19   [provider]  EPINEPHrine  0.3 mg/0.3 mL IJ SOAJ injection Inject 0.3 mg into the muscle as needed for anaphylaxis. 05/15/20   Elnor Lauraine BRAVO, NP  glucose blood (ACCU-CHEK GUIDE TEST) test strip Use as instructed to monitor glucose 4 times daily- use as back up for Dexcom 03/10/23   Therisa Benton PARAS, NP  HYDROcodone -acetaminophen  (NORCO/VICODIN) 5-325 MG tablet Take 1-2 tablets by mouth every 6 (six) hours as needed. 08/20/23   Geroldine Berg, MD  Insulin  Pen Needle (B-D ULTRAFINE III SHORT PEN) 31G X 8 MM MISC USE AS DIRECTED 07/06/21   Lorren Greig PARAS, NP  insulin  regular human CONCENTRATED (HUMULIN  R U-500 KWIKPEN) 500 UNIT/ML KwikPen Inject 90 Units into the skin 3 (three) times daily with meals. 03/10/23   Therisa Benton PARAS, NP  loperamide  (IMODIUM ) 2 MG capsule Take 1 capsule (2 mg total) by mouth every 6 (six) hours as needed for diarrhea or loose stools. 12/10/19   Antoinette Doe, MD  Multiple Vitamins-Calcium  (ONE-A-DAY WOMENS FORMULA) TABS Take 1 tablet by mouth daily.    [provider]  mupirocin  ointment (BACTROBAN ) 2 % APPLY OINTMENT TOPICALLY ONCE DAILY 09/21/22   Standiford, Marsa FALCON, DPM  nitroGLYCERIN   (NITROSTAT ) 0.4 MG SL tablet DISSOLVE ONE TABLET UNDER THE TONGUE EVERY 5 MINUTES AS NEEDED FOR CHEST PAIN.  DO NOT EXCEED A TOTAL OF 3 DOSES IN 15 MINUTES 09/07/22   Okey Vina GAILS, MD  Nystatin  (GERHARDT'S BUTT CREAM) CREA Apply 1 Application topically 2 (two) times daily. 08/15/21   Jerrie Anger, PA  nystatin  powder Apply 1 Application topically 3 (three) times daily. 08/20/23   Geroldine Berg, MD  pantoprazole  (PROTONIX ) 40 MG tablet Take 1 tablet (40 mg total) by mouth daily. Patient taking differently: Take 40 mg by mouth at bedtime. 07/30/20   Elnor Lauraine BRAVO, NP  potassium chloride  SA (KLOR-CON  M20) 20 MEQ tablet TAKE 1 TABLET BY MOUTH ONCE DAILY. PATIENT IS OVERDUE FOR A FOLLOW-UP APPOINTMENT. PLEASE SCHEDULE AN APPOINTMENT FOR ADDITIONAL REFILLS. 08/17/23   Okey Vina GAILS, MD  rosuvastatin  (  CRESTOR ) 40 MG tablet Take 40 mg by mouth at bedtime. 04/16/21   [provider]  Semaglutide , 2 MG/DOSE, 8 MG/3ML SOPN Inject 2 mg as directed once a week. 03/10/23   Therisa Benton PARAS, NP  torsemide  (DEMADEX ) 20 MG tablet Take 2 tablets by mouth once daily 09/19/23   Okey Vina GAILS, MD  Vitamin D , Ergocalciferol , (DRISDOL ) 1.25 MG (50000 UNIT) CAPS capsule Take 50,000 Units by mouth every Monday. 04/16/21   [provider]  VRAYLAR  3 MG capsule Take 3 mg by mouth daily. 11/15/21   [provider]    Physical Exam: BP (!) 141/80 (BP Location: Left Arm)   Pulse 64   Temp 98.1 F (36.7 C) (Oral)   Resp 20   Ht 5' 4 (1.626 m)   Wt 114.3 kg   SpO2 97%   BMI 43.25 kg/m   General: 58 y.o. year-old female well developed well nourished in no acute distress.  Alert and oriented x3. HEENT: NCAT, EOMI Neck: Supple, trachea medial Cardiovascular: Regular rate and rhythm with no rubs or gallops.  No thyromegaly or JVD noted. 2/4 pulses in all 4 extremities. Respiratory: Clear to auscultation with no wheezes or rales. Good inspiratory effort. Abdomen: Soft, nontender nondistended with  normal bowel sounds x4 quadrants. Muskuloskeletal: Right heel wound noted.  No cyanosis, clubbing noted bilaterally Neuro: CN II-XII intact, bilateral upper and lower extremity tremors, sensation, reflexes intact Skin: No ulcerative lesions noted or rashes Psychiatry: Judgement and insight appear normal. Mood is appropriate for condition and setting            Labs on Admission:  Basic Metabolic Panel: Recent Labs  Lab 11/10/23 0137  NA 138  K 4.2  CL 100  CO2 28  GLUCOSE 389*  BUN 18  CREATININE 0.78  CALCIUM  9.1   Liver Function Tests: Recent Labs  Lab 11/10/23 0137  AST 19  ALT 12  ALKPHOS 77  BILITOT 0.5  PROT 6.7  ALBUMIN 3.2*   No results for input(s): LIPASE, AMYLASE in the last 168 hours. No results for input(s): AMMONIA in the last 168 hours. CBC: Recent Labs  Lab 11/10/23 0137  WBC 7.6  NEUTROABS 3.8  HGB 11.3*  HCT 35.5*  MCV 94.4  PLT 158   Cardiac Enzymes: No results for input(s): CKTOTAL, CKMB, CKMBINDEX, TROPONINI in the last 168 hours.  BNP (last 3 results) No results for input(s): BNP in the last 8760 hours.  ProBNP (last 3 results) No results for input(s): PROBNP in the last 8760 hours.  CBG: No results for input(s): GLUCAP in the last 168 hours.  Radiological Exams on Admission: DG Foot Complete Right Result Date: 11/10/2023 CLINICAL DATA:  Soft tissue wound over the heel, evaluate for underlying osteomyelitis EXAM: RIGHT FOOT COMPLETE - 3+ VIEW COMPARISON:  07/01/2022 FINDINGS: No acute fracture or dislocation is noted. No soft tissue abnormality is seen. Calcaneal spurring is noted. The skin sloughing seen on physical exam is noted over the heel. No underlying erosive changes are seen to suggest osteomyelitis. IMPRESSION: No findings to suggest osteomyelitis. Electronically Signed   By: Oneil Devonshire M.D.   On: 11/10/2023 01:38    EKG: I independently viewed the EKG done and my findings are as followed: EKG was  not done in the ED  Assessment/Plan Present on Admission:  Diabetic ulcer of right heel associated with type 2 diabetes mellitus (HCC)  Hypoalbuminemia due to protein-calorie malnutrition (HCC)  Obesity, Class III, BMI 40-49.9 (morbid obesity)  Gastroesophageal reflux disease without esophagitis  Coronary artery disease involving native coronary artery of native heart with angina pectoris (HCC)  Principal Problem:   Diabetic ulcer of right heel associated with type 2 diabetes mellitus (HCC) Active Problems:   Coronary artery disease involving native coronary artery of native heart with angina pectoris (HCC)   Obesity, Class III, BMI 40-49.9 (morbid obesity)   Uncontrolled type 2 diabetes mellitus with hyperglycemia, with long-term current use of insulin  (HCC)   Hypoalbuminemia due to protein-calorie malnutrition (HCC)   Gastroesophageal reflux disease without esophagitis   Pressure ulcer of left thigh   Tremor  Diabetic ulcer of right heel associated with type 2 diabetes mellitus Patient was started on IV vancomycin  and Zosyn , we shall continue with same at this time Podiatrist on-call (Dr. Malvin) was consulted and recommended admitting patient to [facility] for possible debridement Patient will be kept n.p.o. in anticipation for possible surgical procedure in the morning.  Left thigh pressure ulcer Continue wound care  Hypoalbuminemia possibly secondary to mild protein calorie malnutrition Albumin 3.2, protein supplement will be provided  Tremor Daughter at bedside endorsed almost 1 year history of tremor which has not been investigated Consider neurology consult prior to discharge if symptoms persist  Obesity class III (BMI: 43.25) Patient was counseled about the cardiovascular and metabolic risk of morbid obesity. Patient was counseled for diet control, exercise regimen and weight loss.   GERD Continue Protonix   CAD Continue Plavix  and statin when patient resumes  oral intake  Type 2 diabetes mellitus with hyperglycemia Hemoglobin A1c on 03/10/2023 was 13.5 CBG 389 Continue ISS and hypoglycemia protocol  DVT prophylaxis: SCDs  Code Status: Full code  Family Communication: Daughters at bedside (all questions answered to satisfaction)  Consults: Podiatry (Dr. Malvin)  Severity of Illness: The appropriate patient status for this patient is INPATIENT. Inpatient status is judged to be reasonable and necessary in order to provide the required intensity of service to ensure the patient's safety. The patient's presenting symptoms, physical exam findings, and initial radiographic and laboratory data in the context of their chronic comorbidities is felt to place them at high risk for further clinical deterioration. Furthermore, it is not anticipated that the patient will be medically stable for discharge from the hospital within 2 midnights of admission.   * I certify that at the point of admission it is my clinical judgment that the patient will require inpatient hospital care spanning beyond 2 midnights from the point of admission due to high intensity of service, high risk for further deterioration and high frequency of surveillance required.*  Author: Jaxston Chohan, DO 11/10/2023 6:17 AM  For on call review [email or web address].

## 2023-11-10 NOTE — ED Notes (Signed)
 Family is taking home her leg prosthetic and her walker. Nicole [name] would like to be called on transfer to [facility] for admission

## 2023-11-10 NOTE — Progress Notes (Signed)
 Pharmacy Antibiotic Note  Nicole [name] is a 58 y.o. female admitted on 11/09/2023 with wound infection.  Pharmacy has been consulted for vanc/zosyn  dosing.  Plan to tx to [facility] for debridement. Vanc/zosyn  ordered empirically.  Scr 0.8  Plan: Vanc 1g x1 then 1.5g IV q24>>AUC 448, scr 0.8 Zosyn  3.375g IV q8 Level as needed F/u surgical plan  Height: 5' 4 (162.6 cm) Weight: 114.3 kg (251 lb 15.8 oz) IBW/kg (Calculated) : 54.7  Temp (24hrs), Avg:98 F (36.7 C), Min:97.9 F (36.6 C), Max:98.1 F (36.7 C)  Recent Labs  Lab 11/10/23 0137  WBC 7.6  CREATININE 0.78    Estimated Creatinine Clearance: 93.8 mL/min (by C-G formula based on SCr of 0.78 mg/dL).    Allergies  Allergen Reactions   Aspirin  Anaphylaxis and Other (See Comments)    Throat closing    Bee Venom Anaphylaxis   Pineapple Anaphylaxis   Definity  [Perflutren  Lipid Microsphere]     Muscle Aches   E-Mycin [Erythromycin Base] Nausea And Vomiting    Antimicrobials this admission: 9/18 vanc>> 9/18 zosyn >>  Dose adjustments this admission:   Microbiology results: 9/18 blood>>  Sergio Batch, PharmD, BCIDP, AAHIVP, CPP Infectious Disease Pharmacist 11/10/2023 6:01 AM

## 2023-11-10 NOTE — ED Notes (Signed)
 Changed patient's brief and bed sheets.

## 2023-11-10 NOTE — ED Notes (Signed)
[facility] here to transport patient to [facility] 

## 2023-11-11 ENCOUNTER — Inpatient Hospital Stay (HOSPITAL_COMMUNITY)

## 2023-11-11 DIAGNOSIS — E11621 Type 2 diabetes mellitus with foot ulcer: Secondary | ICD-10-CM | POA: Diagnosis not present

## 2023-11-11 DIAGNOSIS — L97909 Non-pressure chronic ulcer of unspecified part of unspecified lower leg with unspecified severity: Secondary | ICD-10-CM

## 2023-11-11 DIAGNOSIS — L97415 Non-pressure chronic ulcer of right heel and midfoot with muscle involvement without evidence of necrosis: Secondary | ICD-10-CM

## 2023-11-11 LAB — VAS US ABI WITH/WO TBI: Right ABI: 0.86

## 2023-11-11 LAB — BASIC METABOLIC PANEL WITH GFR
Anion gap: 14 (ref 5–15)
BUN: 17 mg/dL (ref 6–20)
CO2: 26 mmol/L (ref 22–32)
Calcium: 9.1 mg/dL (ref 8.9–10.3)
Chloride: 97 mmol/L — ABNORMAL LOW (ref 98–111)
Creatinine, Ser: 0.9 mg/dL (ref 0.44–1.00)
GFR, Estimated: >60 mL/min (ref >60)
Glucose, Bld: 201 mg/dL — ABNORMAL HIGH (ref 70–99)
Potassium: 4.1 mmol/L (ref 3.5–5.1)
Sodium: 137 mmol/L (ref 135–145)

## 2023-11-11 LAB — GLUCOSE, CAPILLARY
Glucose-Capillary: 178 mg/dL — ABNORMAL HIGH (ref 70–99)
Glucose-Capillary: 183 mg/dL — ABNORMAL HIGH (ref 70–99)
Glucose-Capillary: 234 mg/dL — ABNORMAL HIGH (ref 70–99)
Glucose-Capillary: 259 mg/dL — ABNORMAL HIGH (ref 70–99)
Glucose-Capillary: 272 mg/dL — ABNORMAL HIGH (ref 70–99)
Glucose-Capillary: 295 mg/dL — ABNORMAL HIGH (ref 70–99)

## 2023-11-11 LAB — CBC
HCT: 33.4 % — ABNORMAL LOW (ref 36.0–46.0)
Hemoglobin: 11.1 g/dL — ABNORMAL LOW (ref 12.0–15.0)
MCH: 30.6 pg (ref 26.0–34.0)
MCHC: 33.2 g/dL (ref 30.0–36.0)
MCV: 92 fL (ref 80.0–100.0)
Platelets: 154 K/uL (ref 150–400)
RBC: 3.63 MIL/uL — ABNORMAL LOW (ref 3.87–5.11)
RDW: 13 % (ref 11.5–15.5)
WBC: 8.8 K/uL (ref 4.0–10.5)
nRBC: 0 % (ref 0.0–0.2)

## 2023-11-11 LAB — SEDIMENTATION RATE: Sed Rate: 40 mm/h — ABNORMAL HIGH (ref 0–22)

## 2023-11-11 LAB — C-REACTIVE PROTEIN: CRP: 2.5 mg/dL — ABNORMAL HIGH (ref <1.0)

## 2023-11-11 MED ORDER — MEDIHONEY WOUND/BURN DRESSING EX PSTE
1.0000 | PASTE | Freq: Every day | CUTANEOUS | Status: DC
  Administered 2023-11-11 – 2023-11-15 (×4): 1 via TOPICAL
  Filled 2023-11-11 (×4): qty 44

## 2023-11-11 MED ORDER — INSULIN GLARGINE 100 UNIT/ML ~~LOC~~ SOLN
35.0000 [IU] | SUBCUTANEOUS | Status: DC
  Administered 2023-11-11 – 2023-11-12 (×2): 35 [IU] via SUBCUTANEOUS
  Filled 2023-11-11 (×2): qty 0.35

## 2023-11-11 MED ORDER — INSULIN ASPART 100 UNIT/ML IJ SOLN
0.0000 [IU] | Freq: Three times a day (TID) | INTRAMUSCULAR | Status: DC
  Administered 2023-11-11: 11 [IU] via SUBCUTANEOUS
  Administered 2023-11-12: 7 [IU] via SUBCUTANEOUS
  Administered 2023-11-12: 4 [IU] via SUBCUTANEOUS
  Administered 2023-11-12: 15 [IU] via SUBCUTANEOUS
  Administered 2023-11-13: 4 [IU] via SUBCUTANEOUS
  Administered 2023-11-13 – 2023-11-14 (×3): 11 [IU] via SUBCUTANEOUS
  Administered 2023-11-14: 7 [IU] via SUBCUTANEOUS
  Administered 2023-11-15: 4 [IU] via SUBCUTANEOUS
  Administered 2023-11-15: 20 [IU] via SUBCUTANEOUS

## 2023-11-11 MED ORDER — INSULIN GLARGINE-YFGN 100 UNIT/ML ~~LOC~~ SOLN: 35.0000 [IU] | Freq: Every day | SUBCUTANEOUS | Status: DC

## 2023-11-11 MED ORDER — ENOXAPARIN SODIUM 60 MG/0.6ML IJ SOSY
55.0000 mg | PREFILLED_SYRINGE | INTRAMUSCULAR | Status: DC
  Administered 2023-11-11 – 2023-11-15 (×4): 55 mg via SUBCUTANEOUS
  Filled 2023-11-11 (×4): qty 0.6

## 2023-11-11 MED ORDER — CLOPIDOGREL BISULFATE 75 MG PO TABS
75.0000 mg | ORAL_TABLET | Freq: Every day | ORAL | Status: DC
  Administered 2023-11-11 – 2023-11-15 (×4): 75 mg via ORAL
  Filled 2023-11-11 (×4): qty 1

## 2023-11-11 MED ORDER — INSULIN ASPART 100 UNIT/ML IJ SOLN
0.0000 [IU] | Freq: Every day | INTRAMUSCULAR | Status: DC
  Administered 2023-11-14: 2 [IU] via SUBCUTANEOUS

## 2023-11-11 MED ORDER — INSULIN ASPART 100 UNIT/ML IJ SOLN
6.0000 [IU] | Freq: Three times a day (TID) | INTRAMUSCULAR | Status: DC
  Administered 2023-11-11 – 2023-11-12 (×2): 6 [IU] via SUBCUTANEOUS

## 2023-11-11 NOTE — Consult Note (Signed)
 PODIATRY CONSULTATION  NAME Nicole [name] MRN [id number] DOB [birth year] DOA 11/09/2023   Reason for consult:  Chief Complaint  Patient presents with   Wound Check    Attending/Consulting physician: T. Kathrin MD  History of present illness: Nicole [name] is a 58 y.o. female with medical history significant of hypertension, hyperlipidemia, GERD, CAD, T2DM, tremors, left BKA who presents to the emergency department due to right heel wound.  Patient endorsed several weeks of puslike field and swollen right heel, this worsened within the last 2 days and the swelling burst resulting in a right heel wound.  She complained of pain which is worse on bearing weight on the right heel, so she presented to the ED for further evaluation and management.  Pt seen this am says the wound on the right heel is new within past few days, denies pain as she says her foot is numb due to neuropathy. We discussed plan for checking blood flow and wound care for now.   Past Medical History:  Diagnosis Date   CAP (community acquired pneumonia)    Streptococcus 01/2011   COPD (chronic obstructive pulmonary disease) (HCC)    Coronary atherosclerosis of native coronary artery    a. Diagnosed [place]  2006 - DES RCA, reports followup cath 2009 at [facility] b. reported 2 stents in [place]  in 2020. c. 07/2021: cath showing patent stents along RCA and mid-LAD with scattered 20% stenosis but no obstructive disease.   Dyslipidemia    Essential hypertension, benign    Morbid obesity (HCC)    Pancreatitis    Type 2 diabetes mellitus (HCC)        Latest Ref Rng & Units 11/11/2023    3:32 AM 11/10/2023    1:37 AM 08/19/2023   10:40 PM  CBC  WBC 4.0 - 10.5 K/uL 8.8  7.6  8.0   Hemoglobin 12.0 - 15.0 g/dL 88.8  88.6  87.4   Hematocrit 36.0 - 46.0 % 33.4  35.5  38.3   Platelets 150 - 400 K/uL 154  158  161        Latest Ref Rng & Units 11/11/2023    3:32 AM 11/10/2023    1:37 AM 08/19/2023   10:40 PM  BMP   Glucose 70 - 99 mg/dL 798  610  846   BUN 6 - 20 mg/dL 17  18  18    Creatinine 0.44 - 1.00 mg/dL 9.09  9.21  9.12   Sodium 135 - 145 mmol/L 137  138  139   Potassium 3.5 - 5.1 mmol/L 4.1  4.2  3.6   Chloride 98 - 111 mmol/L 97  100  102   CO2 22 - 32 mmol/L 26  28  27    Calcium  8.9 - 10.3 mg/dL 9.1  9.1  9.5       Physical Exam: Lower Extremity Exam  L BKA  R foot non palpable DP and PT pulses.  R medial posterior heel necrotic ulceration with firm black eschar overlying with peeling skin surrounding, mild peri wound maceraiton/ fibronecrotic tissues. No purulence and no malodor at present.  Sensation absent to light touch     ASSESSMENT/PLAN OF CARE 58 y.o. female with PMHx significant for  hypertension, hyperlipidemia, GERD, CAD, T2DM, tremors, left BKA  with cellulitis ulceration Right posterior/medial heel 2/2 neuropathy and suspected PAD - no evidence of abscess or osteomyelitis on MRI.  WBC 8.8 A1c 13.2 ESR/CRP: p  MRI R foot: 1. Soft tissue ulceration/wound at  the medial heel with surrounding soft tissue edema, enhancement and cutaneous thickening, most pronounced along the anteromedial margin. These findings are compatible with cellulitis. No loculated fluid collection. 2. No evidence of osteomyelitis.  - Will check ABI / PVR studies given necrotic appearnace of ulceration and non palpable pulses. With A1c severely elevated, raises risk of arterial dz.   - Currently recommend betadine scrub of the ulcer per rn and then cover with silver alginate dressing change daily. Will add order.  - Will continue to asess over weekend and determine if debridement is necessary though likely would recommend continued wound care at this time given clinical picture.  - Continue IV abx broad spectrum  - Anticoagulation: ok to continue per primary - Wound care: Betadine paint scrub to the ulcer then cover with silver alginate, 4x4 gauze, kerlix tape - WB status: WB for transfers ok,  agree prevalon boots while in bed, post op shoe for transfers/ pt - Will continue to follow   Thank you for the consult.  Please contact me directly with any questions or concerns.           Nicole [name], DPM [facility] / [facility]    [address] [place], [place] [address]                Office [phone]  Fax [phone]

## 2023-11-11 NOTE — Inpatient Diabetes Management (Addendum)
 Inpatient Diabetes Program Recommendations  AACE/ADA: New Consensus Statement on Inpatient Glycemic Control (2015)  Target Ranges:  Prepandial:   less than 140 mg/dL      Peak postprandial:   less than 180 mg/dL (1-2 hours)      Critically ill patients:  140 - 180 mg/dL   Lab Results  Component Value Date   GLUCAP 295 (H) 11/11/2023   HGBA1C 13.2 (H) 11/10/2023    Review of Glycemic Control  Latest Reference Range & Units 11/10/23 19:49 11/11/23 00:39 11/11/23 04:22 11/11/23 08:02 11/11/23 11:13  Glucose-Capillary 70 - 99 mg/dL 742 (H) 740 (H) 821 (H) 234 (H) 295 (H)   Diabetes history: DM 2 Outpatient Diabetes medications:  U500 70 units tid with meals Current orders for Inpatient glycemic control:  Novolog  0-20 units tid with meals and HS Novolog  6 units tid with meals (added today) Lantus  35 units daily (added today)  Inpatient Diabetes Program Recommendations:    Agree with orders.  Will follow.   Addendum 1500 Spoke to patient regarding A1C of 13.2%. Patient states she wears a sensor at home and that blood sugars are mostly >200 mg/dL.  She states that blood sugars have been much better here in the hospital.  We discussed the differences in her portion sizes from the hospital vs. Home.  Encouraged her to take medications as ordered and to alert PCP/endocrinologist if CBG's are consistently>200 mg/dL.  Explained importance of glycemic control for healing of wounds.  Discussed importance of follow-up as well.  Patient verbalized understanding.   Thanks,  Randall Bullocks, RN, BC-ADM Inpatient Diabetes Coordinator Pager [phone]  (8a-5p)

## 2023-11-11 NOTE — Plan of Care (Signed)
  Problem: Education: Goal: Ability to describe self-care measures that may prevent or decrease complications (Diabetes Survival Skills Education) will improve Outcome: Progressing Goal: Individualized Educational Video(s) Outcome: Progressing   Problem: Coping: Goal: Ability to adjust to condition or change in health will improve Outcome: Progressing   Problem: Fluid Volume: Goal: Ability to maintain a balanced intake and output will improve Outcome: Progressing   Problem: Health Behavior/Discharge Planning: Goal: Ability to identify and utilize available resources and services will improve Outcome: Progressing Goal: Ability to manage health-related needs will improve Outcome: Progressing   Problem: Metabolic: Goal: Ability to maintain appropriate glucose levels will improve Outcome: Progressing   Problem: Nutritional: Goal: Maintenance of adequate nutrition will improve Outcome: Progressing Goal: Progress toward achieving an optimal weight will improve Outcome: Progressing   Problem: Skin Integrity: Goal: Risk for impaired skin integrity will decrease Outcome: Not Progressing   Problem: Tissue Perfusion: Goal: Adequacy of tissue perfusion will improve Outcome: Not Progressing   Problem: Health Behavior/Discharge Planning: Goal: Ability to manage health-related needs will improve Outcome: Progressing   Problem: Clinical Measurements: Goal: Ability to maintain clinical measurements within normal limits will improve Outcome: Progressing Goal: Will remain free from infection Outcome: Progressing Goal: Diagnostic test results will improve Outcome: Progressing Goal: Respiratory complications will improve Outcome: Progressing Goal: Cardiovascular complication will be avoided Outcome: Progressing   Problem: Activity: Goal: Risk for activity intolerance will decrease Outcome: Not Progressing   Problem: Nutrition: Goal: Adequate nutrition will be  maintained Outcome: Progressing   Problem: Coping: Goal: Level of anxiety will decrease Outcome: Progressing   Problem: Elimination: Goal: Will not experience complications related to bowel motility Outcome: Progressing Goal: Will not experience complications related to urinary retention Outcome: Progressing   Problem: Pain Managment: Goal: General experience of comfort will improve and/or be controlled Outcome: Progressing   Problem: Safety: Goal: Ability to remain free from injury will improve Outcome: Progressing   Problem: Skin Integrity: Goal: Risk for impaired skin integrity will decrease Outcome: Not Progressing

## 2023-11-11 NOTE — Progress Notes (Signed)
 VASCULAR LAB    ABI has been performed.  See CV proc for preliminary results.   Rasmus Preusser, RVT 11/11/2023, 4:00 PM

## 2023-11-11 NOTE — Progress Notes (Signed)
 Orthopedic Tech Progress Note Patient Details:  Nicole [name] September 28, 1965 [id number]  Ortho Devices Type of Ortho Device: Postop shoe/boot Ortho Device/Splint Interventions: Ordered      Efrain A Bob Daversa 11/11/2023, 10:58 AM

## 2023-11-11 NOTE — Progress Notes (Addendum)
 PROGRESS NOTE  Nicole [name] FMW:[id number] DOB: [birth year]   PCP: Shona Norleen PEDLAR, MD  Patient is from: Home.  Lives with daughter.  Has wheelchair and rolling walker for mobility.  DOA: 11/09/2023 LOS: 1  Chief complaints Chief Complaint  Patient presents with   Wound Check     Brief Narrative / Interim history: 58 year old F with PMH of COPD, CAD, IDDM-2, OSA on CPAP but not working, A-fib not on [facility], morbid obesity, left BKA, HTN, tremor, HLD, GERD and prior tobacco use presented to [facility] with worsening right heel wound with pain, swelling pus drainage for about a week or 2, and admitted to [facility] with diabetic right heel ulcer after consultation with podiatry.  In ED, stable vitals.  CMP glucose 389.  Hgb 11.3.  WBC 7.6.  A1c 13.2.  Right foot x-ray negative for osteomyelitis.  Cultures obtained.  Started on Zosyn  and vancomycin .  Podiatry consulted.  MRI ordered.  Transferred to [facility] for further care.  Subjective: Seen and examined earlier this morning.  No major events overnight or this morning.  No complaints.  Reports feeling great.  No significant pain in right heel but she rated 5/10.  She says she is trying to get a new CPAP.  Quit smoking about 4 years ago.  Smoked about half a pack a day for 10 years before that.  Confirms using sliding scale insulin  50 to 90 units with meals per her endocrinologist.  Not on basal insulin .  Objective: Vitals:   11/10/23 1716 11/10/23 2006 11/11/23 0426 11/11/23 0805  BP: (!) 159/66 (!) 134/58 (!) 145/63 (!) 148/45  Pulse: 67 78 67 72  Resp: 17 17 18 19   Temp: 98 F (36.7 C) 97.8 F (36.6 C) 98.2 F (36.8 C) 98.5 F (36.9 C)  TempSrc:  Oral Oral   SpO2: 100% 97% 96% 97%  Weight:      Height:        Examination:  GENERAL: No apparent distress.  Nontoxic. HEENT: MMM.  Vision and hearing grossly intact.  NECK: Supple.  No apparent JVD.  RESP:  No IWOB.  Fair aeration bilaterally. CVS:  RRR. Heart sounds normal.   ABD/GI/GU: BS+. Abd soft, NTND.  MSK/EXT: Right heel ulceration.  No apparent drainage.  See picture under media.  2+ DP pulse in right foot.  Not able to palpate PT pulse.  Left BKA SKIN: Left thigh ulcer with no purulent drainage or surrounding skin erythema.  See picture below NEURO: AA.  Oriented appropriately.  No apparent focal neuro deficit. PSYCH: Calm. Normal affect.     Consultants:  Podiatry  Procedures: None  Microbiology summarized: Blood cultures NGTD  Assessment and plan: Diabetic foot ulcer with infection/cellulitis: Worsening right heel ulcer with some drainage, pain and swelling for 1 to 2 weeks.  No constitutional symptoms.  No leukocytosis.  MRI negative for abscess or osteomyelitis but concerning for cellulitis. -Appreciate input by podiatry-check ABI, continue IV antibiotics, WB for transfer with postop shoe, Prevalon boots while in bed and wound care -Podiatry to continue to follow over the weekend to determine if she needs debridement -Continue IV vancomycin  and Zosyn . -Glycemic and pain control -PT/OT control    Uncontrolled IDDM-2 with hyperglycemia: A1c 13.2% (was 13.5% in 02/2023.  She uses sliding scale insulin  from 50 to 90 units 3 times daily with meals.  Not on basal insulin .  She is also on Ozempic .  Per last endocrinology note in 02/2023, she was supposed to  be on 70 units 3 times daily with meals.  Recent Labs  Lab 11/10/23 1949 11/11/23 0039 11/11/23 0422 11/11/23 0802 11/11/23 1113  GLUCAP 257* 259* 178* 234* 295*  -Increase SSI to resistant scale starting this evening with night coverage -Added NovoLog  6 units 3 times daily with meals starting this evening -Add Semglee  35 units daily starting now -Consult diabetic coordinator -Further adjustment as appropriate.SABRA  History of CAD s/p DES to RCA in 2006 and another 2 stents in 2009.  LHC in 2023 with nonobstructive CAD.  No cardiopulmonary symptoms. -Will resume Plavix  if okay with  podiatry -Continue home metoprolol  and Crestor .  OSA on CPAP: She says home CPAP is not working well.  Trying to get a new one. -CPAP at night  Tremor: Family reported 1 year history of tremor which has not been investigated.  Looks like a resting tremor. - Needs outpatient follow-up with neurology  Mood disorder: Stable - Continue home BuSpar  Left BKA: Uses wheelchair and rolling walker at baseline.  GERD Continue Protonix   Morbid obesity Body mass index is 43.25 kg/m. - Continue Ozempic  on discharge   Left thigh pressure ulcer: No signs of infection. -Continue wound care     Wound 11/10/23 0337 Pressure Injury Hip Left;Lateral (Active)   DVT prophylaxis:  On subcu Lovenox   Code Status: Full code Family Communication: None at bedside Level of care: Med-Surg Status is: Inpatient Remains inpatient appropriate because: Diabetic foot infection   Final disposition: Home   55 minutes with more than 50% spent in reviewing records, counseling patient/family and coordinating care.   Sch Meds:  Scheduled Meds:  enoxaparin  (LOVENOX ) injection  55 mg Subcutaneous Q24H   feeding supplement (GLUCERNA SHAKE)  237 mL Oral TID BM   insulin  aspart  0-20 Units Subcutaneous TID WC   insulin  aspart  0-5 Units Subcutaneous QHS   insulin  aspart  6 Units Subcutaneous TID WC   insulin  glargine-yfgn  35 Units Subcutaneous Daily   leptospermum manuka honey  1 Application Topical Daily   nystatin    Topical BID   pantoprazole   40 mg Oral Daily   rosuvastatin   40 mg Oral QHS   Continuous Infusions:  piperacillin -tazobactam (ZOSYN )  IV 3.375 g (11/11/23 1007)   vancomycin  1,500 mg (11/10/23 1510)   PRN Meds:.HYDROmorphone  (DILAUDID ) injection, oxyCODONE   Antimicrobials: Anti-infectives (From admission, onward)    Start     Dose/Rate Route Frequency Ordered Stop   11/10/23 1600  vancomycin  (VANCOREADY) IVPB 1500 mg/300 mL        1,500 mg 150 mL/hr over 120 Minutes Intravenous  Every 24 hours 11/10/23 0559     11/10/23 0900  piperacillin -tazobactam (ZOSYN ) IVPB 3.375 g        3.375 g 12.5 mL/hr over 240 Minutes Intravenous Every 8 hours 11/10/23 0559     11/10/23 0145  piperacillin -tazobactam (ZOSYN ) IVPB 3.375 g        3.375 g 100 mL/hr over 30 Minutes Intravenous  Once 11/10/23 0133 11/10/23 0410   11/10/23 0130  vancomycin  (VANCOCIN ) IVPB 1000 mg/200 mL premix        1,000 mg 200 mL/hr over 60 Minutes Intravenous  Once 11/10/23 0115 11/10/23 0308        I have personally reviewed the following labs and images: CBC: Recent Labs  Lab 11/10/23 0137 11/11/23 0332  WBC 7.6 8.8  NEUTROABS 3.8  --   HGB 11.3* 11.1*  HCT 35.5* 33.4*  MCV 94.4 92.0  PLT 158 154  BMP &GFR Recent Labs  Lab 11/10/23 0137 11/11/23 0332  NA 138 137  K 4.2 4.1  CL 100 97*  CO2 28 26  GLUCOSE 389* 201*  BUN 18 17  CREATININE 0.78 0.90  CALCIUM  9.1 9.1   Estimated Creatinine Clearance: 83.4 mL/min (by C-G formula based on SCr of 0.9 mg/dL). Liver & Pancreas: Recent Labs  Lab 11/10/23 0137  AST 19  ALT 12  ALKPHOS 77  BILITOT 0.5  PROT 6.7  ALBUMIN 3.2*   No results for input(s): LIPASE, AMYLASE in the last 168 hours. No results for input(s): AMMONIA in the last 168 hours. Diabetic: Recent Labs    11/10/23 0137  HGBA1C 13.2*   Recent Labs  Lab 11/10/23 1949 11/11/23 0039 11/11/23 0422 11/11/23 0802 11/11/23 1113  GLUCAP 257* 259* 178* 234* 295*   Cardiac Enzymes: No results for input(s): CKTOTAL, CKMB, CKMBINDEX, TROPONINI in the last 168 hours. No results for input(s): PROBNP in the last 8760 hours. Coagulation Profile: No results for input(s): INR, PROTIME in the last 168 hours. Thyroid  Function Tests: No results for input(s): TSH, T4TOTAL, FREET4, T3FREE, THYROIDAB in the last 72 hours. Lipid Profile: No results for input(s): CHOL, HDL, LDLCALC, TRIG, CHOLHDL, LDLDIRECT in the last 72  hours. Anemia Panel: No results for input(s): VITAMINB12, FOLATE, FERRITIN, TIBC, IRON, RETICCTPCT in the last 72 hours. Urine analysis:    Component Value Date/Time   COLORURINE STRAW (A) 06/16/2021 1656   APPEARANCEUR CLEAR 06/16/2021 1656   LABSPEC 1.020 06/16/2021 1656   PHURINE 6.0 06/16/2021 1656   GLUCOSEU >=500 (A) 06/16/2021 1656   HGBUR NEGATIVE 06/16/2021 1656   BILIRUBINUR NEGATIVE 06/16/2021 1656   KETONESUR NEGATIVE 06/16/2021 1656   PROTEINUR 100 (A) 06/16/2021 1656   UROBILINOGEN 1.0 02/18/2011 2336   NITRITE NEGATIVE 06/16/2021 1656   LEUKOCYTESUR SMALL (A) 06/16/2021 1656   Sepsis Labs: Invalid input(s): PROCALCITONIN, LACTICIDVEN  Microbiology: Recent Results (from the past 240 hours)  Blood culture (routine x 2)     Status: None (Preliminary result)   Collection Time: 11/10/23  1:37 AM   Specimen: BLOOD  Result Value Ref Range Status   Specimen Description BLOOD BLOOD RIGHT ARM  Final   Special Requests   Final    BOTTLES DRAWN AEROBIC AND ANAEROBIC Blood Culture adequate volume   Culture   Final    NO GROWTH 1 DAY Performed at [facility], [address], [place], [place] [address]    Report Status PENDING  Incomplete  Blood culture (routine x 2)     Status: None (Preliminary result)   Collection Time: 11/10/23  1:39 AM   Specimen: BLOOD  Result Value Ref Range Status   Specimen Description BLOOD RIGHT ANTECUBITAL  Final   Special Requests   Final    BOTTLES DRAWN AEROBIC AND ANAEROBIC Blood Culture adequate volume   Culture   Final    NO GROWTH 1 DAY Performed at [facility], [address] [place], [place] [address]    Report Status PENDING  Incomplete    Radiology Studies: No results found.    Onya Eutsler T. Dessire Grimes [facility]  If 7PM-7AM, please contact night-coverage [email or web address] 11/11/2023, 12:17 PM

## 2023-11-11 NOTE — Consult Note (Signed)
 WOC Nurse Consult Note:  WOC consult performed remotely utilizing imaging and chart review Reason for Consult: L hip pressure ulcer Wound type: Unstageable pressure in jury to L hip Unstageable pressure injury to R heel Pressure Injury POA: Yes Measurement: see nursing flow sheets Wound bed: L hip: 90% covered with yellow slough, 10% pink R heel: 60% black eschar, 40% pink, dry Drainage (amount, consistency, odor) see nursing flow sheets  Periwound: L hip: intact R heel: dry skin, lifting Dressing procedure/placement/frequency:  L hip: cleanse with Vashe (lawson #[id number]), pat dry.  Apply 1/4 thick layer of leptospermum honey to wound bed top with dry gauze and cover with silicone foam, change daily. Ok to lift silicone foam to reapply Medihoney daily.   R heel: paint with Betadine and leave open to air.  Elevate R heel in Prevalon boots (lawson # [id number]) at all times when in bed to off load pressure.    WOC  team will not follow at this time.  Please re-consult if new needs arise.   Thank you,  Doyal Polite, RN, MSN, [facility] WOC Team [phone] (Available Mon-Fri 0700-1500)

## 2023-11-12 DIAGNOSIS — L97419 Non-pressure chronic ulcer of right heel and midfoot with unspecified severity: Secondary | ICD-10-CM

## 2023-11-12 DIAGNOSIS — L97415 Non-pressure chronic ulcer of right heel and midfoot with muscle involvement without evidence of necrosis: Secondary | ICD-10-CM | POA: Diagnosis not present

## 2023-11-12 DIAGNOSIS — E11621 Type 2 diabetes mellitus with foot ulcer: Secondary | ICD-10-CM | POA: Diagnosis not present

## 2023-11-12 DIAGNOSIS — Z89522 Acquired absence of left knee: Secondary | ICD-10-CM

## 2023-11-12 DIAGNOSIS — Z955 Presence of coronary angioplasty implant and graft: Secondary | ICD-10-CM

## 2023-11-12 LAB — CBC
HCT: 33.9 % — ABNORMAL LOW (ref 36.0–46.0)
Hemoglobin: 11.2 g/dL — ABNORMAL LOW (ref 12.0–15.0)
MCH: 30.2 pg (ref 26.0–34.0)
MCHC: 33 g/dL (ref 30.0–36.0)
MCV: 91.4 fL (ref 80.0–100.0)
Platelets: 156 K/uL (ref 150–400)
RBC: 3.71 MIL/uL — ABNORMAL LOW (ref 3.87–5.11)
RDW: 13.1 % (ref 11.5–15.5)
WBC: 7.1 K/uL (ref 4.0–10.5)
nRBC: 0 % (ref 0.0–0.2)

## 2023-11-12 LAB — RENAL FUNCTION PANEL
Albumin: 2.9 g/dL — ABNORMAL LOW (ref 3.5–5.0)
Anion gap: 12 (ref 5–15)
BUN: 20 mg/dL (ref 6–20)
CO2: 25 mmol/L (ref 22–32)
Calcium: 9.2 mg/dL (ref 8.9–10.3)
Chloride: 99 mmol/L (ref 98–111)
Creatinine, Ser: 1 mg/dL (ref 0.44–1.00)
GFR, Estimated: >60 mL/min (ref >60)
Glucose, Bld: 279 mg/dL — ABNORMAL HIGH (ref 70–99)
Phosphorus: 4.2 mg/dL (ref 2.5–4.6)
Potassium: 3.9 mmol/L (ref 3.5–5.1)
Sodium: 136 mmol/L (ref 135–145)

## 2023-11-12 LAB — GLUCOSE, CAPILLARY
Glucose-Capillary: 172 mg/dL — ABNORMAL HIGH (ref 70–99)
Glucose-Capillary: 188 mg/dL — ABNORMAL HIGH (ref 70–99)
Glucose-Capillary: 248 mg/dL — ABNORMAL HIGH (ref 70–99)
Glucose-Capillary: 253 mg/dL — ABNORMAL HIGH (ref 70–99)
Glucose-Capillary: 265 mg/dL — ABNORMAL HIGH (ref 70–99)
Glucose-Capillary: 350 mg/dL — ABNORMAL HIGH (ref 70–99)

## 2023-11-12 LAB — MAGNESIUM: Magnesium: 1.6 mg/dL — ABNORMAL LOW (ref 1.7–2.4)

## 2023-11-12 MED ORDER — INSULIN GLARGINE 100 UNIT/ML ~~LOC~~ SOLN
35.0000 [IU] | Freq: Two times a day (BID) | SUBCUTANEOUS | Status: DC
  Filled 2023-11-12: qty 0.35

## 2023-11-12 MED ORDER — POLYETHYLENE GLYCOL 3350 17 G PO PACK: 17.0000 g | PACK | Freq: Two times a day (BID) | ORAL | Status: DC | PRN

## 2023-11-12 MED ORDER — INSULIN GLARGINE 100 UNIT/ML ~~LOC~~ SOLN
20.0000 [IU] | Freq: Every day | SUBCUTANEOUS | Status: DC
  Filled 2023-11-12: qty 0.2

## 2023-11-12 MED ORDER — POLYETHYLENE GLYCOL 3350 17 G PO PACK
17.0000 g | PACK | Freq: Two times a day (BID) | ORAL | Status: AC
  Administered 2023-11-12 (×2): 17 g via ORAL
  Filled 2023-11-12 (×2): qty 1

## 2023-11-12 MED ORDER — SENNOSIDES-DOCUSATE SODIUM 8.6-50 MG PO TABS
2.0000 | ORAL_TABLET | Freq: Two times a day (BID) | ORAL | Status: AC
  Administered 2023-11-12 (×2): 2 via ORAL
  Filled 2023-11-12 (×2): qty 2

## 2023-11-12 MED ORDER — INSULIN GLARGINE 100 UNIT/ML ~~LOC~~ SOLN
35.0000 [IU] | Freq: Every day | SUBCUTANEOUS | Status: DC
Start: 2023-11-13 — End: 2023-11-13
  Filled 2023-11-12: qty 0.35

## 2023-11-12 MED ORDER — INSULIN ASPART 100 UNIT/ML IJ SOLN
8.0000 [IU] | Freq: Three times a day (TID) | INTRAMUSCULAR | Status: DC
  Administered 2023-11-12 – 2023-11-13 (×5): 8 [IU] via SUBCUTANEOUS

## 2023-11-12 MED ORDER — SENNOSIDES-DOCUSATE SODIUM 8.6-50 MG PO TABS: 2.0000 | ORAL_TABLET | Freq: Two times a day (BID) | ORAL | Status: DC | PRN

## 2023-11-12 MED ORDER — MAGNESIUM SULFATE 2 GM/50ML IV SOLN
2.0000 g | Freq: Once | INTRAVENOUS | Status: AC
  Administered 2023-11-12: 2 g via INTRAVENOUS
  Filled 2023-11-12: qty 50

## 2023-11-12 NOTE — Progress Notes (Signed)
 PROGRESS NOTE  Nicole [name] FMW:[id number] DOB: [birth year]   PCP: Shona Norleen PEDLAR, MD  Patient is from: Home.  Lives with daughter.  Has wheelchair and rolling walker for mobility.  DOA: 11/09/2023 LOS: 2  Chief complaints Chief Complaint  Patient presents with   Wound Check     Brief Narrative / Interim history: 58 year old F with PMH of COPD, CAD, IDDM-2, OSA on CPAP but not working, A-fib not on [facility], morbid obesity, left BKA, HTN, tremor, HLD, GERD and prior tobacco use presented to [facility] with worsening right heel wound with pain, swelling pus drainage for about a week or 2, and admitted to [facility] with diabetic right heel ulcer after consultation with podiatry.  In ED, stable vitals.  CMP glucose 389.  Hgb 11.3.  WBC 7.6.  A1c 13.2.  Right foot x-ray negative for osteomyelitis.  Cultures obtained.  Started on Zosyn  and vancomycin .  Podiatry consulted.  MRI ordered.  Transferred to [facility] for further care.    MRI negative for abscess or osteomyelitis but concerning for cellulitis.  ABI with mild disease.  VVS consulted and planning angiography on 9/22.  Podiatry following.  Remains on broad-spectrum antibiotics.  Subjective: Seen and examined earlier this morning.  No major events overnight or this morning.  Reports pain in her right heel.  Rates her pain 6/10.  No other complaints.  Objective: Vitals:   11/11/23 1656 11/11/23 1947 11/12/23 0331 11/12/23 0841  BP: (!) 123/54 133/69 (!) 165/71 (!) 158/55  Pulse: 66 75 66 74  Resp: 18 18 18 18   Temp: 98.2 F (36.8 C) 98.6 F (37 C) 98.1 F (36.7 C) 98.6 F (37 C)  TempSrc: Oral Oral Oral Oral  SpO2: 98% 95% 96% 99%  Weight:      Height:        Examination:  GENERAL: No apparent distress.  Nontoxic. HEENT: MMM.  Vision and hearing grossly intact.  NECK: Supple.  No apparent JVD.  RESP:  No IWOB.  Fair aeration bilaterally. CVS:  RRR. Heart sounds normal.  ABD/GI/GU: BS+. Abd soft, NTND.  MSK/EXT: Right  heel ulceration.  No apparent drainage.  See picture under media.  2+ DP pulse in right foot.  Not able to palpate PT pulse.  Left BKA SKIN: Left thigh ulcer with no purulent drainage or surrounding skin erythema.  See picture below NEURO: AA.  Oriented appropriately.  No apparent focal neuro deficit. PSYCH: Calm. Normal affect.   Consultants:  Podiatry Vascular surgery  Procedures: None  Microbiology summarized: Blood cultures NGTD  Assessment and plan: Diabetic foot ulcer with infection/cellulitis: Worsening right heel ulcer with some drainage, pain and swelling for 1 to 2 weeks.  No constitutional symptoms.  No leukocytosis.  MRI negative for abscess or osteomyelitis but concerning for cellulitis.  ABI with mild disease in RLE. -Podiatry recs: IV antibiotics, WB for transfer with postop shoe, Prevalon boots in bed and wound care -Vascular surgery recs: Continue Plavix  and angiography on 9/22. -On IV vancomycin  and Zosyn . -Glycemic and pain control -PT/OT control    Uncontrolled IDDM-2 with hyperglycemia: A1c 13.2% (was 13.5% in 02/2023.  She uses sliding scale insulin  from 50 to 90 units 3 times daily with meals.  Not on basal insulin .  She is also on Ozempic .  Per last endocrinology note in 02/2023, she was supposed to be on 70 units 3 times daily with meals.  Recent Labs  Lab 11/11/23 1948 11/12/23 0033 11/12/23 9667 11/12/23 9241 11/12/23  1144  GLUCAP 183* 248* 265* 253* 350*  -Continue SSI-resistant scale -Increase Semglee  from 35 units daily to twice daily -Increase NovoLog  from 6 to 8 units 3 times daily with meals -Carb modified diet. -Further adjustment as appropriate. -Diabetic coordinator consulted.  History of CAD s/p DES to RCA in 2006 and another 2 stents in 2009.  LHC in 2023 with nonobstructive CAD.  No cardiopulmonary symptoms. -Continue home Plavix , metoprolol  and Crestor .  OSA on CPAP: She says home CPAP is not working well.  Trying to get a new  one. -CPAP at night  Tremor: Family reported 1 year history of tremor which has not been investigated.  Looks like a resting tremor. - Needs outpatient follow-up with neurology  Mood disorder: Stable - Continue home BuSpar   Left BKA: Uses wheelchair and rolling walker at baseline.  GERD -Continue Protonix   Morbid obesity Body mass index is 43.25 kg/m. - Continue Ozempic  on discharge   Left thigh pressure ulcer: No signs of infection. -Continue wound care     Wound 11/10/23 0337 Pressure Injury Hip Left;Lateral (Active)   DVT prophylaxis:  On subcu Lovenox   Code Status: Full code Family Communication: None at bedside Level of care: Med-Surg Status is: Inpatient Remains inpatient appropriate because: Diabetic foot infection   Final disposition: Home   55 minutes with more than 50% spent in reviewing records, counseling patient/family and coordinating care.   Sch Meds:  Scheduled Meds:  clopidogrel   75 mg Oral Daily   enoxaparin  (LOVENOX ) injection  55 mg Subcutaneous Q24H   feeding supplement (GLUCERNA SHAKE)  237 mL Oral TID BM   insulin  aspart  0-20 Units Subcutaneous TID WC   insulin  aspart  0-5 Units Subcutaneous QHS   insulin  aspart  8 Units Subcutaneous TID WC   insulin  glargine  35 Units Subcutaneous Q24H   leptospermum manuka honey  1 Application Topical Daily   nystatin    Topical BID   pantoprazole   40 mg Oral Daily   polyethylene glycol  17 g Oral BID   rosuvastatin   40 mg Oral QHS   senna-docusate  2 tablet Oral BID   Continuous Infusions:  piperacillin -tazobactam (ZOSYN )  IV 3.375 g (11/12/23 0830)   vancomycin  Stopped (11/11/23 1719)   PRN Meds:.HYDROmorphone  (DILAUDID ) injection, oxyCODONE , polyethylene glycol **FOLLOWED BY** [START ON 11/13/2023] polyethylene glycol, senna-docusate **FOLLOWED BY** [START ON 11/13/2023] senna-docusate  Antimicrobials: Anti-infectives (From admission, onward)    Start     Dose/Rate Route Frequency Ordered  Stop   11/10/23 1600  vancomycin  (VANCOREADY) IVPB 1500 mg/300 mL        1,500 mg 150 mL/hr over 120 Minutes Intravenous Every 24 hours 11/10/23 0559     11/10/23 0900  piperacillin -tazobactam (ZOSYN ) IVPB 3.375 g        3.375 g 12.5 mL/hr over 240 Minutes Intravenous Every 8 hours 11/10/23 0559     11/10/23 0145  piperacillin -tazobactam (ZOSYN ) IVPB 3.375 g        3.375 g 100 mL/hr over 30 Minutes Intravenous  Once 11/10/23 0133 11/10/23 0410   11/10/23 0130  vancomycin  (VANCOCIN ) IVPB 1000 mg/200 mL premix        1,000 mg 200 mL/hr over 60 Minutes Intravenous  Once 11/10/23 0115 11/10/23 0308        I have personally reviewed the following labs and images: CBC: Recent Labs  Lab 11/10/23 0137 11/11/23 0332 11/12/23 0731  WBC 7.6 8.8 7.1  NEUTROABS 3.8  --   --   HGB 11.3* 11.1*  11.2*  HCT 35.5* 33.4* 33.9*  MCV 94.4 92.0 91.4  PLT 158 154 156   BMP &GFR Recent Labs  Lab 11/10/23 0137 11/11/23 0332 11/12/23 0731  NA 138 137 136  K 4.2 4.1 3.9  CL 100 97* 99  CO2 28 26 25   GLUCOSE 389* 201* 279*  BUN 18 17 20   CREATININE 0.78 0.90 1.00  CALCIUM  9.1 9.1 9.2  MG  --   --  1.6*  PHOS  --   --  4.2   Estimated Creatinine Clearance: 75.1 mL/min (by C-G formula based on SCr of 1 mg/dL). Liver & Pancreas: Recent Labs  Lab 11/10/23 0137 11/12/23 0731  AST 19  --   ALT 12  --   ALKPHOS 77  --   BILITOT 0.5  --   PROT 6.7  --   ALBUMIN 3.2* 2.9*   No results for input(s): LIPASE, AMYLASE in the last 168 hours. No results for input(s): AMMONIA in the last 168 hours. Diabetic: Recent Labs    11/10/23 0137  HGBA1C 13.2*   Recent Labs  Lab 11/11/23 1948 11/12/23 0033 11/12/23 0332 11/12/23 0758 11/12/23 1144  GLUCAP 183* 248* 265* 253* 350*   Cardiac Enzymes: No results for input(s): CKTOTAL, CKMB, CKMBINDEX, TROPONINI in the last 168 hours. No results for input(s): PROBNP in the last 8760 hours. Coagulation Profile: No results for  input(s): INR, PROTIME in the last 168 hours. Thyroid  Function Tests: No results for input(s): TSH, T4TOTAL, FREET4, T3FREE, THYROIDAB in the last 72 hours. Lipid Profile: No results for input(s): CHOL, HDL, LDLCALC, TRIG, CHOLHDL, LDLDIRECT in the last 72 hours. Anemia Panel: No results for input(s): VITAMINB12, FOLATE, FERRITIN, TIBC, IRON, RETICCTPCT in the last 72 hours. Urine analysis:    Component Value Date/Time   COLORURINE STRAW (A) 06/16/2021 1656   APPEARANCEUR CLEAR 06/16/2021 1656   LABSPEC 1.020 06/16/2021 1656   PHURINE 6.0 06/16/2021 1656   GLUCOSEU >=500 (A) 06/16/2021 1656   HGBUR NEGATIVE 06/16/2021 1656   BILIRUBINUR NEGATIVE 06/16/2021 1656   KETONESUR NEGATIVE 06/16/2021 1656   PROTEINUR 100 (A) 06/16/2021 1656   UROBILINOGEN 1.0 02/18/2011 2336   NITRITE NEGATIVE 06/16/2021 1656   LEUKOCYTESUR SMALL (A) 06/16/2021 1656   Sepsis Labs: Invalid input(s): PROCALCITONIN, LACTICIDVEN  Microbiology: Recent Results (from the past 240 hours)  Blood culture (routine x 2)     Status: None (Preliminary result)   Collection Time: 11/10/23  1:37 AM   Specimen: BLOOD  Result Value Ref Range Status   Specimen Description BLOOD BLOOD RIGHT ARM  Final   Special Requests   Final    BOTTLES DRAWN AEROBIC AND ANAEROBIC Blood Culture adequate volume   Culture   Final    NO GROWTH 2 DAYS Performed at [facility], [address] [place], [place] [address]    Report Status PENDING  Incomplete  Blood culture (routine x 2)     Status: None (Preliminary result)   Collection Time: 11/10/23  1:39 AM   Specimen: BLOOD  Result Value Ref Range Status   Specimen Description BLOOD RIGHT ANTECUBITAL  Final   Special Requests   Final    BOTTLES DRAWN AEROBIC AND ANAEROBIC Blood Culture adequate volume   Culture   Final    NO GROWTH 2 DAYS Performed at [facility], [address], [place], [place] [address]    Report Status PENDING   Incomplete    Radiology Studies: VAS US  ABI WITH/WO TBI Result Date: 11/11/2023  LOWER  EXTREMITY DOPPLER STUDY Patient Name:  Nicole [name]  Date of Exam:   11/11/2023 Medical Rec #: [id number]        Accession #:    [id number] Date of Birth: 1965-11-04         Patient Gender: F Patient Age:   34 years Exam Location:  [facility] Procedure:      VAS US  ABI WITH/WO TBI Referring Phys: MARSA HONOUR --------------------------------------------------------------------------------  Indications: Ulceration. Left BKA 01/2020 High Risk Factors: Hypertension, hyperlipidemia, Diabetes (uncontrolled), past                    history of smoking. Other Factors: COPD, TIA, CKD, OSA on CPAP.  Limitations: Today's exam was limited due to Tremors. Comparison Study: Prior ABI done 09/09/22 Performing Technologist: Rachel Pellet RVS  Examination Guidelines: A complete evaluation includes at minimum, Doppler waveform signals and systolic blood pressure reading at the level of bilateral brachial, anterior tibial, and posterior tibial arteries, when vessel segments are accessible. Bilateral testing is considered an integral part of a complete examination. Photoelectric Plethysmograph (PPG) waveforms and toe systolic pressure readings are included as required and additional duplex testing as needed. Limited examinations for reoccurring indications may be performed as noted.  ABI Findings: +---------+------------------+-----+---------+--------+ Right    Rt Pressure (mmHg)IndexWaveform Comment  +---------+------------------+-----+---------+--------+ Brachial 140                    triphasic         +---------+------------------+-----+---------+--------+ PTA      120               0.86 biphasic          +---------+------------------+-----+---------+--------+ DP       113               0.81 biphasic          +---------+------------------+-----+---------+--------+ Great Toe82                0.59                    +---------+------------------+-----+---------+--------+ +---------+------------------+-----+---------+-------+ Left     Lt Pressure (mmHg)IndexWaveform Comment +---------+------------------+-----+---------+-------+ Brachial 139                    triphasic        +---------+------------------+-----+---------+-------+ PTA                                      BKA     +---------+------------------+-----+---------+-------+ DP                                       BKA     +---------+------------------+-----+---------+-------+ Great Toe                                BKA     +---------+------------------+-----+---------+-------+ +-------+-----------+-----------+------------+--------------+ ABI/TBIToday's ABIToday's TBIPrevious ABIPrevious TBI   +-------+-----------+-----------+------------+--------------+ Right  0.86       0.59       1.01        wound/bandages +-------+-----------+-----------+------------+--------------+ Left   BKA        BKA        BKA         BKA            +-------+-----------+-----------+------------+--------------+  Right ABIs appear decreased compared to prior study on 09/09/22.  Summary: Right: Resting right ankle-brachial index indicates mild right lower extremity arterial disease. The right toe-brachial index is abnormal.  Left:  BKA. *See table(s) above for measurements and observations.  Electronically signed by Penne Colorado MD on 11/11/2023 at 7:48:46 PM.    Final       Sparkles Mcneely T. Dorance Spink [facility]  If 7PM-7AM, please contact night-coverage [email or web address] 11/12/2023, 1:45 PM

## 2023-11-12 NOTE — Evaluation (Signed)
 Physical Therapy Evaluation Patient Details Name: Nicole [name] MRN: [id number] DOB: Jan 26, 1966 Today's Date: 11/12/2023  History of Present Illness  Patient is a 58 yo woman that presents to the hospital for a R worsening R heel wound and c/o cellultis, Scheduled angioplasty 9/22. PMHx: HTN, HLD, GERD CAD, DM, tremor, L BKA  Clinical Impression  Pt admitted with/for worsening heel wound.  Revascularization likely pending.  Pt needing min assist or less to get to EOB and complete STS.  Pt currently limited functionally due to the problems listed below.  (see problems list.)  Pt will benefit from PT to maximize function and safety to be able to get home safely with available assist.         If plan is discharge home, recommend the following: A little help with walking and/or transfers;A little help with bathing/dressing/bathroom;Assistance with cooking/housework;Assist for transportation   Can travel by private vehicle        Equipment Recommendations None recommended by PT  Recommendations for Other Services       Functional Status Assessment Patient has had a recent decline in their functional status and demonstrates the ability to make significant improvements in function in a reasonable and predictable amount of time.     Precautions / Restrictions Precautions Precautions: None (WBAT in post op shoe for transfers) Required Braces or Orthoses:  (post op shoe) Restrictions Weight Bearing Restrictions Per Provider Order: No      Mobility  Bed Mobility Overal bed mobility: Needs Assistance Bed Mobility: Supine to Sit     Supine to sit: Min assist          Transfers Overall transfer level: Needs assistance Equipment used: Rolling walker (2 wheels) Transfers: Sit to/from Stand Sit to Stand: Min assist           General transfer comment: min steady assist.  pt used UE's appropriately    Ambulation/Gait                  Stairs             Wheelchair Mobility     Tilt Bed    Modified Rankin (Stroke Patients Only)       Balance Overall balance assessment: Needs assistance Sitting-balance support: No upper extremity supported, Feet supported Sitting balance-Leahy Scale: Good     Standing balance support: Reliant on assistive device for balance                                 Pertinent Vitals/Pain Pain Assessment Pain Assessment: Faces Faces Pain Scale: Hurts little more Pain Location: R foot Pain Descriptors / Indicators: Aching Pain Intervention(s): Monitored during session    Home Living Family/patient expects to be discharged to:: Private residence Living Arrangements: Children Available Help at Discharge: Family Type of Home: House Home Access: Ramped entrance       Home Layout: One level Home Equipment: Rollator (4 wheels);Rolling Walker (2 wheels);BSC/3in1      Prior Function Prior Level of Function : Needs assist       Physical Assist : ADLs (physical)   ADLs (physical): IADLs;Bathing         Extremity/Trunk Assessment   Upper Extremity Assessment Upper Extremity Assessment: Defer to OT evaluation    Lower Extremity Assessment Lower Extremity Assessment: Overall WFL for tasks assessed    Cervical / Trunk Assessment Cervical / Trunk Assessment: Normal  Communication   Communication  Communication: No apparent difficulties    Cognition Arousal: Alert Behavior During Therapy: WFL for tasks assessed/performed                             Following commands: Intact       Cueing Cueing Techniques: Verbal cues (to poush up from seated surface)     General Comments      Exercises     Assessment/Plan    PT Assessment Patient needs continued PT services  PT Problem List Decreased strength;Decreased activity tolerance;Decreased mobility;Decreased knowledge of use of DME;Pain       PT Treatment Interventions      PT Goals (Current goals  can be found in the Care Plan section)  Acute Rehab PT Goals Patient Stated Goal: back home after the revascularization,  Save the leg PT Goal Formulation: With patient Time For Goal Achievement: 11/25/23 Potential to Achieve Goals: Good    Frequency Min 2X/week     Co-evaluation               AM-PAC PT 6 Clicks Mobility  Outcome Measure                  End of Session   Activity Tolerance: Patient tolerated treatment well (pt to be WBAT for transfers only)     PT Visit Diagnosis: Other abnormalities of gait and mobility (R26.89);Pain Pain - Right/Left: Right Pain - part of body:  (foot/heel)    Time: 8342-8286 PT Time Calculation (min) (ACUTE ONLY): 16 min   Charges:   PT Evaluation $PT Eval Moderate Complexity: 1 Mod   PT General Charges $$ ACUTE PT VISIT: 1 Visit         11/12/2023  India HERO., PT Acute Rehabilitation Services [phone]  (office)  Vinie GAILS Brogan Martis 11/12/2023, 6:19 PM

## 2023-11-12 NOTE — Progress Notes (Signed)
 PODIATRY PROGRESS NOTE  NAME Nicole [name] MRN [id number] DOB 1965-09-10 DOA 11/09/2023   Reason for consult:  Chief Complaint  Patient presents with   Wound Check     History of present illness: 58 y.o. female  with medical history significant of hypertension, hyperlipidemia, GERD, CAD, T2DM, tremors, left BKA who presents to the emergency department due to right heel wound.  Patient endorsed several weeks of puslike field and swollen right heel, this worsened within the last 2 days and the swelling burst resulting in a right heel wound.  She complained of pain which is worse on bearing weight on the right heel, so she presented to the ED for further evaluation and management.   Pt tells me today that the wound has been present for a few weeks, denies pain as she says her foot is numb due to neuropathy. Feels the same as yesterday. We discussed the ABI results that show somewhat diminished flow and plan for continued wound care at this time.   Vitals:   11/12/23 0331 11/12/23 0841  BP: (!) 165/71 (!) 158/55  Pulse: 66 74  Resp: 18 18  Temp: 98.1 F (36.7 C) 98.6 F (37 C)  SpO2: 96% 99%       Latest Ref Rng & Units 11/12/2023    7:31 AM 11/11/2023    3:32 AM 11/10/2023    1:37 AM  CBC  WBC 4.0 - 10.5 K/uL 7.1  8.8  7.6   Hemoglobin 12.0 - 15.0 g/dL 88.7  88.8  88.6   Hematocrit 36.0 - 46.0 % 33.9  33.4  35.5   Platelets 150 - 400 K/uL 156  154  158        Latest Ref Rng & Units 11/12/2023    7:31 AM 11/11/2023    3:32 AM 11/10/2023    1:37 AM  BMP  Glucose 70 - 99 mg/dL 720  798  610   BUN 6 - 20 mg/dL 20  17  18    Creatinine 0.44 - 1.00 mg/dL 8.99  9.09  9.21   Sodium 135 - 145 mmol/L 136  137  138   Potassium 3.5 - 5.1 mmol/L 3.9  4.1  4.2   Chloride 98 - 111 mmol/L 99  97  100   CO2 22 - 32 mmol/L 25  26  28    Calcium  8.9 - 10.3 mg/dL 9.2  9.1  9.1       Physical Exam: Lower Extremity Exam   L BKA   R foot non palpable DP and PT pulses.   R medial  posterior heel necrotic ulceration with firm black eschar overlying with peeling skin surrounding, mild peri wound maceration/ fibronecrotic tissues. Mild serous drainage. No purulence and no malodor at present. Similar presentation to yesterday. Slight decrease in size of eschar.    Sensation absent to light touch    ASSESSMENT/PLAN OF CARE  58 y.o. female with PMHx significant for  hypertension, hyperlipidemia, GERD, CAD, T2DM, tremors, left BKA  with cellulitis ulceration Right posterior/medial heel 2/2 neuropathy and suspected PAD - no evidence of abscess or osteomyelitis on MRI.   WBC 7.1 A1c 13.2 ESR/CRP: elevated with ESR 40, CRP 2.5    MRI R foot: 1. Soft tissue ulceration/wound at the medial heel with surrounding soft tissue edema, enhancement and cutaneous thickening, most pronounced along the anteromedial margin. These findings are compatible with cellulitis. No loculated fluid collection. 2. No evidence of osteomyelitis.  ABI right leg  Right:  Resting right ankle-brachial index indicates mild right lower  extremity arterial disease. The right toe-brachial index is abnormal.    - Discussed results of ABI that show mild results along with abnormal TBI reading. Discussed with hospitalist recommendation for consultation to vascular to see about possible options for improving blood flow. - Given lab values, imaging, clinical course, no urgent need for debridement noted.  - Will continue to monitor over the weekend. Plan for local wound care, if pathology worsens will consider debridement early next week. - Continue IV abx broad spectrum  - Anticoagulation: ok to continue per primary - Wound care: Betadine paint scrub to the ulcer then cover with silver alginate, 4x4 gauze, kerlix tape - WB status: WB for transfers ok, agree prevalon boots while in bed, post op shoe for transfers/ pt - Will continue to follow  Please contact me directly with any questions or concerns.     Prentice Ovens, DPM [facility]  Dr. Donnice SAUNDERS. Wagoner, DPM    [address] [place], [place] [address]                Office [phone]  Fax [phone]

## 2023-11-12 NOTE — Plan of Care (Signed)

## 2023-11-12 NOTE — Consult Note (Signed)
 Hospital Consult    Reason for Consult:  Right heel ulcer Referring Physician:  Dr. Kathrin MRN #:  [id number]  History of Present Illness: This is a 58 y.o. female with history of left below-knee amputation 4 years ago.  She does not have any history of vascular disease.  She did undergo coronary artery stenting many years ago and [place]  as well as [place]  more recently.  She remains on Plavix  daily.  1 month ago noted a small sore on her right heel.  This has now progressed to ulceration.  She does not walk but she does use her right lower extremity for mobility.  She has not had infection around the heel and denies fever or chills.  She has neuropathy of her right foot did not note any trauma to the right foot at the time of initial ulcer.  Past Medical History:  Diagnosis Date   CAP (community acquired pneumonia)    Streptococcus 01/2011   COPD (chronic obstructive pulmonary disease) (HCC)    Coronary atherosclerosis of native coronary artery    a. Diagnosed [place]  2006 - DES RCA, reports followup cath 2009 at [facility] b. reported 2 stents in [place]  in 2020. c. 07/2021: cath showing patent stents along RCA and mid-LAD with scattered 20% stenosis but no obstructive disease.   Dyslipidemia    Essential hypertension, benign    Morbid obesity (HCC)    Pancreatitis    Type 2 diabetes mellitus (HCC)     Past Surgical History:  Procedure Laterality Date   ABDOMINAL HERNIA REPAIR     ABDOMINAL HYSTERECTOMY     AMPUTATION Left 01/25/2020   Procedure: LEFT BELOW KNEE AMPUTATION;  Surgeon: Harden Jerona GAILS, MD;  [facility];  Service: Orthopedics;  Laterality: Left;   CORONARY ANGIOPLASTY WITH STENT PLACEMENT  2006   KNEE SURGERY     LEFT HEART CATH AND CORONARY ANGIOGRAPHY N/A 08/14/2021   Procedure: LEFT HEART CATH AND CORONARY ANGIOGRAPHY;  Surgeon: Burnard Debby LABOR, MD;  Location: [facility];  Service: Cardiovascular;  Laterality: N/A;   NOSE SURGERY     Pilonidal  cystectomy     TONSILLECTOMY      Allergies  Allergen Reactions   Aspirin  Anaphylaxis and Other (See Comments)    Throat closing    Bee Venom Anaphylaxis   Pineapple Anaphylaxis   Definity  [Perflutren  Lipid Microsphere]     Muscle Aches   E-Mycin [Erythromycin Base] Nausea And Vomiting    Prior to Admission medications   Medication Sig Start Date End Date Taking? Authorizing Provider  acetaminophen  (TYLENOL ) 500 MG tablet Take 1 tablet (500 mg total) by mouth every 6 (six) hours as needed for moderate pain. 02/05/20  Yes Amin, Ankit C, MD  albuterol  (VENTOLIN  HFA) 108 (90 Base) MCG/ACT inhaler Inhale 2 puffs into the lungs every 6 (six) hours as needed for wheezing. Shortness of breath 07/30/20  Yes Elnor Lauraine BRAVO, NP  busPIRone  (BUSPAR ) 5 MG tablet Take 5 mg by mouth 2 (two) times daily. 08/08/23  Yes [provider]  Cholecalciferol  1.25 MG (50000 UT) capsule Take 50,000 Units by mouth once a week. 09/12/23  Yes [provider]  clopidogrel  (PLAVIX ) 75 MG tablet Take 1 tablet by mouth once daily 11/09/23  Yes Okey Vina GAILS, MD  EPINEPHrine  0.3 mg/0.3 mL IJ SOAJ injection Inject 0.3 mg into the muscle as needed for anaphylaxis. 05/15/20  Yes Elnor Lauraine BRAVO, NP  insulin  regular human CONCENTRATED (HUMULIN  R U-500 KWIKPEN) 500  UNIT/ML KwikPen Inject 90 Units into the skin 3 (three) times daily with meals. Patient taking differently: Inject 50-90 Units into the skin 3 (three) times daily with meals. Sliding scale 03/10/23  Yes Therisa Benton PARAS, NP  levocetirizine (XYZAL ) 5 MG tablet Take 5 mg by mouth daily.   Yes [provider]  metoprolol  tartrate (LOPRESSOR ) 25 MG tablet Take 25 mg by mouth 2 (two) times daily. as directed 08/08/23  Yes [provider]  nitroGLYCERIN  (NITROSTAT ) 0.4 MG SL tablet DISSOLVE ONE TABLET UNDER THE TONGUE EVERY 5 MINUTES AS NEEDED FOR CHEST PAIN.  DO NOT EXCEED A TOTAL OF 3 DOSES IN 15 MINUTES 09/07/22  Yes Okey Vina GAILS, MD   nystatin  powder Apply 1 Application topically 3 (three) times daily. 08/20/23  Yes Delo, Vicenta, MD  pantoprazole  (PROTONIX ) 40 MG tablet Take 1 tablet (40 mg total) by mouth daily. Patient taking differently: Take 40 mg by mouth at bedtime. 07/30/20  Yes Elnor Lauraine BRAVO, NP  potassium chloride  SA (KLOR-CON  M20) 20 MEQ tablet TAKE 1 TABLET BY MOUTH ONCE DAILY. PATIENT IS OVERDUE FOR A FOLLOW-UP APPOINTMENT. PLEASE SCHEDULE AN APPOINTMENT FOR ADDITIONAL REFILLS. 08/17/23  Yes Okey Vina GAILS, MD  rosuvastatin  (CRESTOR ) 40 MG tablet Take 40 mg by mouth at bedtime. 04/16/21  Yes [provider]  Semaglutide , 2 MG/DOSE, 8 MG/3ML SOPN Inject 2 mg as directed once a week. 03/10/23  Yes Therisa Benton PARAS, NP  torsemide  (DEMADEX ) 20 MG tablet Take 2 tablets by mouth once daily 09/19/23  Yes Okey Vina GAILS, MD  traMADol  (ULTRAM ) 50 MG tablet Take 50 mg by mouth every 8 (eight) hours. 10/25/23  Yes [provider]  VRAYLAR  3 MG capsule Take 3 mg by mouth daily. 11/15/21  Yes [provider]  Accu-Chek Softclix Lancets lancets Use as instructed to monitor glucose 4 times daily- use as backup to Dexcom 03/10/23   Therisa Benton PARAS, NP  blood glucose meter kit and supplies Dispense based on patient and insurance preference. Use four times daily as directed. (FOR ICD-10 E10.9, E11.9). 01/09/21   Therisa Benton PARAS, NP  doxycycline  (VIBRA -TABS) 100 MG tablet Take 100 mg by mouth 2 (two) times daily. for 10 days Patient not taking: Reported on 11/10/2023 08/12/23   [provider]  EASY TOUCH INSULIN  SYRINGE 31G X 5/16 1 ML MISC  09/21/19   [provider]  fluconazole  (DIFLUCAN ) 150 MG tablet Take 150 mg by mouth once. Patient not taking: Reported on 11/10/2023 10/25/23   [provider]  glucose blood (ACCU-CHEK GUIDE TEST) test strip Use as instructed to monitor glucose 4 times daily- use as back up for Dexcom 03/10/23   Therisa Benton PARAS, NP  Insulin  Pen Needle (B-D  ULTRAFINE III SHORT PEN) 31G X 8 MM MISC USE AS DIRECTED 07/06/21   Lorren Greig PARAS, NP    Social History   Socioeconomic History   Marital status: Single    Spouse name: Not on file   Number of children: 3   Years of education: Not on file   Highest education level: Not on file  Occupational History   Occupation: Home health aide    Employer: [employer]  Tobacco Use   Smoking status: Former    Current packs/day: 0.00    Average packs/day: 0.5 packs/day for 20.0 years (10.0 ttl pk-yrs)    Types: Cigarettes    Start date: 01/22/2000    Quit date: 01/22/2020    Years since  quitting: 3.8   Smokeless tobacco: Never  Vaping Use   Vaping status: Never Used  Substance and Sexual Activity   Alcohol use: No   Drug use: No   Sexual activity: Yes    Birth control/protection: Surgical  Other Topics Concern   Not on file  Social History Narrative   Not on file   Social Drivers of Health   Financial Resource Strain: Not on file  Food Insecurity: No Food Insecurity (11/10/2023)   Hunger Vital Sign    Worried About Running Out of Food in the Last Year: Never true    Ran Out of Food in the Last Year: Never true  Transportation Needs: No Transportation Needs (11/10/2023)   PRAPARE - Administrator, Civil Service (Medical): No    Lack of Transportation (Non-Medical): No  Physical Activity: Not on file  Stress: Not on file  Social Connections: Not on file  Intimate Partner Violence: Not At Risk (11/10/2023)   Humiliation, Afraid, Rape, and Kick questionnaire    Fear of Current or Ex-Partner: No    Emotionally Abused: No    Physically Abused: No    Sexually Abused: No     Family History  Problem Relation Age of Onset   Diabetes Mother    Heart failure Mother    Hypertension Mother    Kidney failure Mother    Ovarian cancer Mother    Asthma Son     Review of Systems  Constitutional: Negative.   HENT: Negative.    Eyes: Negative.    Respiratory: Negative.    Cardiovascular: Negative.   Musculoskeletal: Negative.   Skin: Negative.   Neurological:  Positive for sensory change.  Endo/Heme/Allergies: Negative.   Psychiatric/Behavioral: Negative.        Physical Examination  Vitals:   11/12/23 0331 11/12/23 0841  BP: (!) 165/71 (!) 158/55  Pulse: 66 74  Resp: 18 18  Temp: 98.1 F (36.7 C) 98.6 F (37 C)  SpO2: 96% 99%   Body mass index is 43.25 kg/m.  Physical Exam Constitutional:      Appearance: She is obese.  HENT:     Head: Normocephalic.     Nose: Nose normal.  Eyes:     Pupils: Pupils are equal, round, and reactive to light.  Cardiovascular:     Pulses:          Radial pulses are 0 on the right side and 2+ on the left side.       Popliteal pulses are 2+ on the right side.     Comments: Difficult to palpate femoral pulses due to body habitus Pulmonary:     Effort: Pulmonary effort is normal.  Abdominal:     General: Abdomen is flat.  Skin:    General: Skin is warm.     Capillary Refill: Capillary refill takes less than 2 seconds.  Neurological:     General: No focal deficit present.     Mental Status: She is alert.  Psychiatric:        Mood and Affect: Mood normal.      CBC    Component Value Date/Time   WBC 7.1 11/12/2023 0731   RBC 3.71 (L) 11/12/2023 0731   HGB 11.2 (L) 11/12/2023 0731   HCT 33.9 (L) 11/12/2023 0731   PLT 156 11/12/2023 0731   MCV 91.4 11/12/2023 0731   MCH 30.2 11/12/2023 0731   MCHC 33.0 11/12/2023 0731   RDW 13.1 11/12/2023 0731   LYMPHSABS  3.0 11/10/2023 0137   MONOABS 0.7 11/10/2023 0137   EOSABS 0.1 11/10/2023 0137   BASOSABS 0.1 11/10/2023 0137    BMET    Component Value Date/Time   NA 136 11/12/2023 0731   K 3.9 11/12/2023 0731   CL 99 11/12/2023 0731   CO2 25 11/12/2023 0731   GLUCOSE 279 (H) 11/12/2023 0731   BUN 20 11/12/2023 0731   CREATININE 1.00 11/12/2023 0731   CREATININE 0.86 07/30/2020 1558   CALCIUM  9.2 11/12/2023 0731    GFRNONAA >60 11/12/2023 0731   GFRNONAA 76 07/30/2020 1558   GFRAA 88 07/30/2020 1558    COAGS: Lab Results  Component Value Date   INR 1.4 (H) 01/23/2020     Non-Invasive Vascular Imaging:   ABI Findings:  +---------+------------------+-----+---------+--------+  Right   Rt Pressure (mmHg)IndexWaveform Comment   +---------+------------------+-----+---------+--------+  Brachial 140                    triphasic          +---------+------------------+-----+---------+--------+  PTA     120               0.86 biphasic           +---------+------------------+-----+---------+--------+  DP      113               0.81 biphasic           +---------+------------------+-----+---------+--------+  Great Toe82                0.59                    +---------+------------------+-----+---------+--------+   +---------+------------------+-----+---------+-------+  Left    Lt Pressure (mmHg)IndexWaveform Comment  +---------+------------------+-----+---------+-------+  Brachial 139                    triphasic         +---------+------------------+-----+---------+-------+  PTA                                     BKA      +---------+------------------+-----+---------+-------+  DP                                      BKA      +---------+------------------+-----+---------+-------+  Great Toe                                BKA      +---------+------------------+-----+---------+-------+   +-------+-----------+-----------+------------+--------------+  ABI/TBIToday's ABIToday's TBIPrevious ABIPrevious TBI    +-------+-----------+-----------+------------+--------------+  Right 0.86       0.59       1.01        wound/bandages  +-------+-----------+-----------+------------+--------------+  Left  BKA        BKA        BKA         BKA             +-------+-----------+-----------+------------+--------------+       Right  ABIs appear decreased compared to prior study on 09/09/22.    Summary:  Right: Resting right ankle-brachial index indicates mild right lower  extremity arterial disease. The right toe-brachial index is abnormal.       ASSESSMENT/PLAN: This is a 58 y.o. female  with right heel ulceration with mildly depressed ABIs in the right foot with nonpalpable pulses.  We discussed that this likely represents small vessel disease.  She is at high risk for major amputation.  Will plan for angiography from left common femoral approach in the Cath Lab on Monday.  She can continue Plavix  perioperatively.  Will revisit with patient to answer any further questions tomorrow.  Teresita Fanton C. Sheree, MD [facility] Office: [phone] Pager: [phone]

## 2023-11-12 NOTE — Evaluation (Signed)
 Occupational Therapy Evaluation Patient Details Name: Nicole [name] MRN: [id number] DOB: 06-18-1965 Today's Date: 11/12/2023   History of Present Illness   Patient is a 58 yo woman that presents to the hospital for a R worsening R heel wound and c/o cellultis, Scheduled angioplasty 9/22. PMHx: HTN, HLD, GERD CAD, DM, tremor, L BKA     Clinical Impressions Patient reports living at home in a 1 level home  with her family and requires A from her daughter for bathing and is Independent for other ADLs.  Dependent on family to complete IADLs. Patient reports that  she uses RW for functional mobility.  Patients currently presents with min/mod  A for ADLs,  min A bed mobility and functional mobility not completed 2/2 not having prosthesis her in the hospital with her. Patient would benefit from additional OT intervention to address functional deficits of ADLs, and functional mobility and overall safety  Recommending HHOT to further increase independence and success at home.     If plan is discharge home, recommend the following:   A little help with walking and/or transfers;Help with stairs or ramp for entrance;Assistance with cooking/housework;A little help with bathing/dressing/bathroom     Functional Status Assessment   Patient has had a recent decline in their functional status and demonstrates the ability to make significant improvements in function in a reasonable and predictable amount of time.     Equipment Recommendations   None recommended by OT     Recommendations for Other Services         Precautions/Restrictions   Precautions Precautions: None (WBAT in post op shoe for transfers) Required Braces or Orthoses:  (post op shoe) Restrictions Weight Bearing Restrictions Per Provider Order: No     Mobility Bed Mobility Overal bed mobility: Needs Assistance Bed Mobility: Supine to Sit     Supine to sit: Min assist          Transfers Overall transfer  level: Needs assistance Equipment used: Rolling walker (2 wheels) Transfers: Sit to/from Stand Sit to Stand: Min assist                  Balance Overall balance assessment: Needs assistance Sitting-balance support: No upper extremity supported, Feet supported       Standing balance support: Reliant on assistive device for balance                               ADL either performed or assessed with clinical judgement   ADL Overall ADL's : Needs assistance/impaired Eating/Feeding: Independent   Grooming: Wash/dry hands;Wash/dry face;Sitting;Supervision/safety   Upper Body Bathing: Set up;Sitting   Lower Body Bathing: Moderate assistance   Upper Body Dressing : Moderate assistance;Sitting   Lower Body Dressing: Moderate assistance                       Vision Patient Visual Report: No change from baseline Vision Assessment?: No apparent visual deficits     Perception         Praxis         Pertinent Vitals/Pain Pain Assessment Pain Assessment: 0-10 Pain Score: 6  Pain Location: R foot Pain Descriptors / Indicators: Aching Pain Intervention(s): Monitored during session     Extremity/Trunk Assessment Upper Extremity Assessment Upper Extremity Assessment: Generalized weakness   Lower Extremity Assessment Lower Extremity Assessment: Defer to PT evaluation   Cervical / Trunk Assessment Cervical / Trunk Assessment:  Normal   Communication Communication Communication: No apparent difficulties   Cognition Arousal: Alert Behavior During Therapy: WFL for tasks assessed/performed Cognition: No apparent impairments                               Following commands: Intact       Cueing  General Comments   Cueing Techniques: Verbal cues (to poush up from seated surface)      Exercises     Shoulder Instructions      Home Living Family/patient expects to be discharged to:: Private residence Living Arrangements:  Children Available Help at Discharge: Family Type of Home: House Home Access: Ramped entrance     Home Layout: One level     Bathroom Shower/Tub: Chief Strategy Officer: Standard Bathroom Accessibility: No   Home Equipment: Rollator (4 wheels);Rolling Walker (2 wheels);BSC/3in1          Prior Functioning/Environment Prior Level of Function : Needs assist       Physical Assist : ADLs (physical)   ADLs (physical): IADLs;Bathing        OT Problem List: Decreased strength;Decreased safety awareness   OT Treatment/Interventions: Self-care/ADL training;Therapeutic exercise;Neuromuscular education;Therapeutic activities;Patient/family education      OT Goals(Current goals can be found in the care plan section)   Acute Rehab OT Goals OT Goal Formulation: With patient Time For Goal Achievement: 11/26/23 Potential to Achieve Goals: Good   OT Frequency:  Min 2X/week    Co-evaluation              AM-PAC OT 6 Clicks Daily Activity     Outcome Measure Help from another person eating meals?: None Help from another person taking care of personal grooming?: A Little Help from another person toileting, which includes using toliet, bedpan, or urinal?: A Lot Help from another person bathing (including washing, rinsing, drying)?: A Lot Help from another person to put on and taking off regular upper body clothing?: A Little Help from another person to put on and taking off regular lower body clothing?: A Lot 6 Click Score: 16   End of Session Equipment Utilized During Treatment: Rolling walker (2 wheels) Nurse Communication: Mobility status  Activity Tolerance: Patient tolerated treatment well Patient left: in bed;with call bell/phone within reach  OT Visit Diagnosis: Unsteadiness on feet (R26.81);Muscle weakness (generalized) (M62.81)                Time: 8582-8558 OT Time Calculation (min): 24 min Charges:  OT General Charges $OT Visit: 1 Visit OT  Evaluation $OT Eval Moderate Complexity: 1 Mod OT Treatments $Self Care/Home Management : 8-22 mins  Lamarr Pouch OT/L  Lamarr JONETTA Pouch 11/12/2023, 4:51 PM

## 2023-11-13 DIAGNOSIS — L97419 Non-pressure chronic ulcer of right heel and midfoot with unspecified severity: Secondary | ICD-10-CM

## 2023-11-13 DIAGNOSIS — L89220 Pressure ulcer of left hip, unstageable: Secondary | ICD-10-CM

## 2023-11-13 DIAGNOSIS — E8809 Other disorders of plasma-protein metabolism, not elsewhere classified: Secondary | ICD-10-CM | POA: Diagnosis not present

## 2023-11-13 DIAGNOSIS — I25119 Atherosclerotic heart disease of native coronary artery with unspecified angina pectoris: Secondary | ICD-10-CM

## 2023-11-13 DIAGNOSIS — E11621 Type 2 diabetes mellitus with foot ulcer: Secondary | ICD-10-CM | POA: Diagnosis not present

## 2023-11-13 LAB — GLUCOSE, CAPILLARY
Glucose-Capillary: 198 mg/dL — ABNORMAL HIGH (ref 70–99)
Glucose-Capillary: 256 mg/dL — ABNORMAL HIGH (ref 70–99)
Glucose-Capillary: 264 mg/dL — ABNORMAL HIGH (ref 70–99)
Glucose-Capillary: 191 mg/dL — ABNORMAL HIGH (ref 70–99)

## 2023-11-13 MED ORDER — INSULIN GLARGINE 100 UNIT/ML ~~LOC~~ SOLN
25.0000 [IU] | Freq: Every day | SUBCUTANEOUS | Status: DC
  Administered 2023-11-13 – 2023-11-14 (×2): 25 [IU] via SUBCUTANEOUS
  Filled 2023-11-13 (×3): qty 0.25

## 2023-11-13 MED ORDER — INSULIN GLARGINE 100 UNIT/ML ~~LOC~~ SOLN
40.0000 [IU] | Freq: Every day | SUBCUTANEOUS | Status: DC
  Administered 2023-11-13 – 2023-11-14 (×2): 40 [IU] via SUBCUTANEOUS
  Filled 2023-11-13 (×3): qty 0.4

## 2023-11-13 MED ORDER — METOPROLOL TARTRATE 25 MG PO TABS
25.0000 mg | ORAL_TABLET | Freq: Two times a day (BID) | ORAL | Status: DC
Start: 2023-11-13 — End: 2023-11-15
  Administered 2023-11-13 – 2023-11-15 (×5): 25 mg via ORAL
  Filled 2023-11-13 (×5): qty 1

## 2023-11-13 MED ORDER — BUSPIRONE HCL 5 MG PO TABS
5.0000 mg | ORAL_TABLET | Freq: Two times a day (BID) | ORAL | Status: DC
Start: 2023-11-13 — End: 2023-11-15
  Administered 2023-11-13 – 2023-11-15 (×5): 5 mg via ORAL
  Filled 2023-11-13 (×5): qty 1

## 2023-11-13 MED ORDER — SODIUM CHLORIDE 0.9 % IV SOLN
2.0000 g | Freq: Three times a day (TID) | INTRAVENOUS | Status: DC
  Administered 2023-11-13 – 2023-11-15 (×7): 2 g via INTRAVENOUS
  Filled 2023-11-13 (×7): qty 12.5

## 2023-11-13 MED ORDER — METOCLOPRAMIDE HCL 5 MG/ML IJ SOLN
5.0000 mg | Freq: Four times a day (QID) | INTRAMUSCULAR | Status: DC | PRN
  Administered 2023-11-13 – 2023-11-14 (×2): 5 mg via INTRAVENOUS
  Filled 2023-11-13 (×3): qty 2

## 2023-11-13 MED ORDER — METRONIDAZOLE 500 MG PO TABS
500.0000 mg | ORAL_TABLET | Freq: Two times a day (BID) | ORAL | Status: DC
Start: 2023-11-13 — End: 2023-11-15
  Administered 2023-11-13 – 2023-11-15 (×5): 500 mg via ORAL
  Filled 2023-11-13 (×5): qty 1

## 2023-11-13 MED ORDER — LINEZOLID 600 MG/300ML IV SOLN
600.0000 mg | Freq: Two times a day (BID) | INTRAVENOUS | Status: DC
  Administered 2023-11-13 – 2023-11-15 (×4): 600 mg via INTRAVENOUS
  Filled 2023-11-13 (×6): qty 300

## 2023-11-13 MED ORDER — CARIPRAZINE HCL 1.5 MG PO CAPS
3.0000 mg | ORAL_CAPSULE | Freq: Every day | ORAL | Status: DC
  Administered 2023-11-13 – 2023-11-15 (×3): 3 mg via ORAL
  Filled 2023-11-13 (×3): qty 2

## 2023-11-13 MED ORDER — GABAPENTIN 100 MG PO CAPS
200.0000 mg | ORAL_CAPSULE | Freq: Every day | ORAL | Status: DC
Start: 2023-11-13 — End: 2023-11-15
  Administered 2023-11-13 – 2023-11-14 (×2): 200 mg via ORAL
  Filled 2023-11-13 (×2): qty 2

## 2023-11-13 NOTE — Plan of Care (Signed)
   Problem: Education: Goal: Ability to describe self-care measures that may prevent or decrease complications (Diabetes Survival Skills Education) will improve Outcome: Progressing Goal: Individualized Educational Video(s) Outcome: Progressing

## 2023-11-13 NOTE — Progress Notes (Addendum)
  Progress Note    11/13/2023 10:13 AM Hospital Day 3  Subjective:  no complaints.  afebrile  Vitals:   11/13/23 0511 11/13/23 0849  BP: (!) 151/50 109/60  Pulse: 61 73  Resp: 19 17  Temp: 98.4 F (36.9 C) 98.3 F (36.8 C)  SpO2: 96% 97%    Physical Exam: General:  no distress Lungs:  non labored Dressing in place R foot  CBC    Component Value Date/Time   WBC 7.1 11/12/2023 0731   RBC 3.71 (L) 11/12/2023 0731   HGB 11.2 (L) 11/12/2023 0731   HCT 33.9 (L) 11/12/2023 0731   PLT 156 11/12/2023 0731   MCV 91.4 11/12/2023 0731   MCH 30.2 11/12/2023 0731   MCHC 33.0 11/12/2023 0731   RDW 13.1 11/12/2023 0731   LYMPHSABS 3.0 11/10/2023 0137   MONOABS 0.7 11/10/2023 0137   EOSABS 0.1 11/10/2023 0137   BASOSABS 0.1 11/10/2023 0137    BMET    Component Value Date/Time   NA 136 11/12/2023 0731   K 3.9 11/12/2023 0731   CL 99 11/12/2023 0731   CO2 25 11/12/2023 0731   GLUCOSE 279 (H) 11/12/2023 0731   BUN 20 11/12/2023 0731   CREATININE 1.00 11/12/2023 0731   CREATININE 0.86 07/30/2020 1558   CALCIUM  9.2 11/12/2023 0731   GFRNONAA >60 11/12/2023 0731   GFRNONAA 76 07/30/2020 1558   GFRAA 88 07/30/2020 1558    INR    Component Value Date/Time   INR 1.4 (H) 01/23/2020 0421     Intake/Output Summary (Last 24 hours) at 11/13/2023 1013 Last data filed at 11/13/2023 0813 Gross per 24 hour  Intake 240 ml  Output 2275 ml  Net -2035 ml     Assessment/Plan:  58 y.o. female with right heel ulcer and hx of left BKA 4 years ago  Hospital Day 3  -plan for angiogram tomorrow with Dr. Sheree -consent placed -ok for light breakfast given the fact she is diabetic.  Plan for procedure late morning.    Lucie Apt, PA-C [facility] [phone] 11/13/2023 10:13 AM  I have independently interviewed and examined patient and agree with PA assessment and plan above. Plan for aortogram possible invention right lower extremity tomorrow in hopes  of limb salvage.  I have discussed this with the patient and also contacted her daughter [name] via telephone.  All questions were answered they demonstrate good understanding and agreed to proceed.  Lundon Rosier C. Sheree, MD [facility] [place] Office: [phone] Pager: [phone]

## 2023-11-13 NOTE — Progress Notes (Signed)
 PROGRESS NOTE  Nicole [name] FMW:[id number] DOB: 10-Jul-1965   PCP: Shona Norleen PEDLAR, MD  Patient is from: Home.  Lives with daughter.  Has wheelchair and rolling walker for mobility.  DOA: 11/09/2023 LOS: 3  Chief complaints Chief Complaint  Patient presents with   Wound Check     Brief Narrative / Interim history: 58 year old F with PMH of COPD, CAD, IDDM-2, OSA on CPAP but not working, A-fib not on [facility], morbid obesity, left BKA, HTN, tremor, HLD, GERD and prior tobacco use presented to [facility] with worsening right heel wound with pain, swelling pus drainage for about a week or 2, and admitted to [facility] with diabetic right heel ulcer after consultation with podiatry.  In ED, stable vitals.  CMP glucose 389.  Hgb 11.3.  WBC 7.6.  A1c 13.2.  Right foot x-ray negative for osteomyelitis.  Cultures obtained.  Started on Zosyn  and vancomycin .  Podiatry consulted.  MRI ordered.  Transferred to [facility] for further care.    MRI negative for abscess or osteomyelitis but concerning for cellulitis.  ABI with mild disease.  [facility]VS consulted and planning angiography on 9/22.  Podiatry following.  Remains on broad-spectrum antibiotics.  Subjective: Seen and examined earlier this morning.  No major events overnight or this morning.  Reports improvement in pain.  Rates her pain 5/10.  No other complaints.  Asks me to call her daughter.  Objective: Vitals:   11/12/23 1707 11/12/23 2034 11/13/23 0511 11/13/23 0849  BP: (!) 141/88 139/67 (!) 151/50 109/60  Pulse: 72 64 61 73  Resp: 17 17 19 17   Temp:  98 F (36.7 C) 98.4 F (36.9 C) 98.3 F (36.8 C)  TempSrc:   Oral   SpO2: 98% 97% 96% 97%  Weight:      Height:        Examination:  GENERAL: No apparent distress.  Nontoxic. HEENT: MMM.  Vision and hearing grossly intact.  NECK: Supple.  No apparent JVD.  RESP:  No IWOB.  Fair aeration bilaterally. CVS:  RRR. Heart sounds normal.  ABD/GI/GU: BS+. Abd soft, NTND.  MSK/EXT: Right heel  ulceration.  No apparent drainage.  See picture under media.  2+ DP pulse in right foot.  Not able to palpate PT pulse.  Left BKA SKIN: Left thigh ulcer with no purulent drainage or surrounding skin erythema.  See picture below NEURO: AA.  Oriented appropriately.  No apparent focal neuro deficit. PSYCH: Calm. Normal affect.   Consultants:  Podiatry Vascular surgery  Procedures: None  Microbiology summarized: Blood cultures NGTD  Assessment and plan: Diabetic foot ulcer with infection/cellulitis: Worsening right heel ulcer with some drainage, pain and swelling for 1 to 2 weeks.  No constitutional symptoms.  No leukocytosis.  MRI negative for abscess or osteomyelitis but concerning for cellulitis.  ABI with mild disease in RLE. -Podiatry recs: IV antibiotics, WB for transfer with postop shoe, Prevalon boots in bed and wound care -Vascular surgery recs: Continue Plavix  and angiography on 9/22. -Vancomycin  and Zosyn >> Zyvox , cefepime  and Flagyl  -Glycemic and pain control -PT/OT control    Uncontrolled IDDM-2 with hyperglycemia: A1c 13.2% (was 13.5% in 02/2023.  Per last endocrinology note in 02/2023, she was supposed to be on 70 units 3 times daily with meals. Per daughter, struggle to make her use her insulin .  Also out of her Ozempic .  Supposed to be on SSI from 50 to 90 units 3 times daily but just started using 1 week ago.  Not  on basal insulin .   Recent Labs  Lab 11/12/23 1144 11/12/23 1708 11/12/23 2112 11/13/23 0800 11/13/23 1227  GLUCAP 350* 188* 172* 198* 256*  -Continue SSI-resistant scale -Increase Semglee  from 35 to 40 units in the morning and 20 to 25 units at bedtime -Continue NovoLog  8 units 3 times daily with meals -Carb modified diet. -Further adjustment as appropriate. -She may need to restart Ozempic  at low-dose on discharge -Diabetic coordinator consulted.  History of CAD s/p DES to RCA in 2006 and another 2 stents in 2009.  LHC in 2023 with nonobstructive CAD.   No cardiopulmonary symptoms. -Continue home Plavix , metoprolol  and Crestor .  OSA on CPAP: She says home CPAP is not working well.  Trying to get a new one. -CPAP at night  Tremor: Per family, going on for 1 year and has not been investigated.  Looks like a resting tremor. -Start low-dose gabapentin .  Initially she has neuropathy from poorly controlled diabetes as well - Needs outpatient follow-up with neurology  Mood disorder: Stable - Continue home BuSpar   Left BKA: Uses wheelchair and rolling walker at baseline.  GERD -Continue Protonix   Morbid obesity Body mass index is 43.25 kg/m. - Needs to restart her Ozempic  on discharge.   Left thigh pressure ulcer: No signs of infection. -Continue wound care     Wound 11/10/23 0337 Pressure Injury Hip Left;Lateral (Active)   DVT prophylaxis:  On subcu Lovenox   Code Status: Full code Family Communication: Updated patient's daughter, Shanda over the phone Level of care: Med-Surg Status is: Inpatient Remains inpatient appropriate because: Diabetic foot infection   Final disposition: Home   55 minutes with more than 50% spent in reviewing records, counseling patient/family and coordinating care.   Sch Meds:  Scheduled Meds:  busPIRone   5 mg Oral BID   cariprazine   3 mg Oral Daily   clopidogrel   75 mg Oral Daily   enoxaparin  (LOVENOX ) injection  55 mg Subcutaneous Q24H   feeding supplement (GLUCERNA SHAKE)  237 mL Oral TID BM   insulin  aspart  0-20 Units Subcutaneous TID WC   insulin  aspart  0-5 Units Subcutaneous QHS   insulin  aspart  8 Units Subcutaneous TID WC   insulin  glargine  40 Units Subcutaneous Daily   And   insulin  glargine  25 Units Subcutaneous QHS   leptospermum manuka honey  1 Application Topical Daily   metoprolol  tartrate  25 mg Oral BID   metroNIDAZOLE   500 mg Oral Q12H   nystatin    Topical BID   pantoprazole   40 mg Oral Daily   rosuvastatin   40 mg Oral QHS   Continuous Infusions:  ceFEPime   (MAXIPIME ) IV 2 g (11/13/23 0906)   linezolid  (ZYVOX ) IV 600 mg (11/13/23 1001)   PRN Meds:.HYDROmorphone  (DILAUDID ) injection, metoCLOPramide  (REGLAN ) injection, oxyCODONE , [COMPLETED] polyethylene glycol **FOLLOWED BY** polyethylene glycol, [COMPLETED] senna-docusate **FOLLOWED BY** senna-docusate  Antimicrobials: Anti-infectives (From admission, onward)    Start     Dose/Rate Route Frequency Ordered Stop   11/13/23 1000  linezolid  (ZYVOX ) IVPB 600 mg        600 mg 300 mL/hr over 60 Minutes Intravenous Every 12 hours 11/13/23 0848     11/13/23 1000  metroNIDAZOLE  (FLAGYL ) tablet 500 mg        500 mg Oral Every 12 hours 11/13/23 0848     11/13/23 0945  ceFEPIme  (MAXIPIME ) 2 g in sodium chloride  0.9 % 100 mL IVPB        2 g 200 mL/hr over 30 Minutes Intravenous  Every 8 hours 11/13/23 0848     11/10/23 1600  vancomycin  (VANCOREADY) IVPB 1500 mg/300 mL  Status:  Discontinued        1,500 mg 150 mL/hr over 120 Minutes Intravenous Every 24 hours 11/10/23 0559 11/13/23 0846   11/10/23 0900  piperacillin -tazobactam (ZOSYN ) IVPB 3.375 g  Status:  Discontinued        3.375 g 12.5 mL/hr over 240 Minutes Intravenous Every 8 hours 11/10/23 0559 11/13/23 0846   11/10/23 0145  piperacillin -tazobactam (ZOSYN ) IVPB 3.375 g        3.375 g 100 mL/hr over 30 Minutes Intravenous  Once 11/10/23 0133 11/10/23 0410   11/10/23 0130  vancomycin  (VANCOCIN ) IVPB 1000 mg/200 mL premix        1,000 mg 200 mL/hr over 60 Minutes Intravenous  Once 11/10/23 0115 11/10/23 0308        I have personally reviewed the following labs and images: CBC: Recent Labs  Lab 11/10/23 0137 11/11/23 0332 11/12/23 0731  WBC 7.6 8.8 7.1  NEUTROABS 3.8  --   --   HGB 11.3* 11.1* 11.2*  HCT 35.5* 33.4* 33.9*  MCV 94.4 92.0 91.4  PLT 158 154 156   BMP &GFR Recent Labs  Lab 11/10/23 0137 11/11/23 0332 11/12/23 0731  NA 138 137 136  K 4.2 4.1 3.9  CL 100 97* 99  CO2 28 26 25   GLUCOSE 389* 201* 279*  BUN 18 17 20    CREATININE 0.78 0.90 1.00  CALCIUM  9.1 9.1 9.2  MG  --   --  1.6*  PHOS  --   --  4.2   Estimated Creatinine Clearance: 75.1 mL/min (by C-G formula based on SCr of 1 mg/dL). Liver & Pancreas: Recent Labs  Lab 11/10/23 0137 11/12/23 0731  AST 19  --   ALT 12  --   ALKPHOS 77  --   BILITOT 0.5  --   PROT 6.7  --   ALBUMIN 3.2* 2.9*   No results for input(s): LIPASE, AMYLASE in the last 168 hours. No results for input(s): AMMONIA in the last 168 hours. Diabetic: No results for input(s): HGBA1C in the last 72 hours.  Recent Labs  Lab 11/12/23 1144 11/12/23 1708 11/12/23 2112 11/13/23 0800 11/13/23 1227  GLUCAP 350* 188* 172* 198* 256*   Cardiac Enzymes: No results for input(s): CKTOTAL, CKMB, CKMBINDEX, TROPONINI in the last 168 hours. No results for input(s): PROBNP in the last 8760 hours. Coagulation Profile: No results for input(s): INR, PROTIME in the last 168 hours. Thyroid  Function Tests: No results for input(s): TSH, T4TOTAL, FREET4, T3FREE, THYROIDAB in the last 72 hours. Lipid Profile: No results for input(s): CHOL, HDL, LDLCALC, TRIG, CHOLHDL, LDLDIRECT in the last 72 hours. Anemia Panel: No results for input(s): VITAMINB12, FOLATE, FERRITIN, TIBC, IRON, RETICCTPCT in the last 72 hours. Urine analysis:    Component Value Date/Time   COLORURINE STRAW (A) 06/16/2021 1656   APPEARANCEUR CLEAR 06/16/2021 1656   LABSPEC 1.020 06/16/2021 1656   PHURINE 6.0 06/16/2021 1656   GLUCOSEU >=500 (A) 06/16/2021 1656   HGBUR NEGATIVE 06/16/2021 1656   BILIRUBINUR NEGATIVE 06/16/2021 1656   KETONESUR NEGATIVE 06/16/2021 1656   PROTEINUR 100 (A) 06/16/2021 1656   UROBILINOGEN 1.0 02/18/2011 2336   NITRITE NEGATIVE 06/16/2021 1656   LEUKOCYTESUR SMALL (A) 06/16/2021 1656   Sepsis Labs: Invalid input(s): PROCALCITONIN, LACTICIDVEN  Microbiology: Recent Results (from the past 240 hours)  Blood culture  (routine x 2)     Status: None (  Preliminary result)   Collection Time: 11/10/23  1:37 AM   Specimen: BLOOD  Result Value Ref Range Status   Specimen Description BLOOD BLOOD RIGHT ARM  Final   Special Requests   Final    BOTTLES DRAWN AEROBIC AND ANAEROBIC Blood Culture adequate volume   Culture   Final    NO GROWTH 3 DAYS Performed at [facility], [address], [place], [place] [address]    Report Status PENDING  Incomplete  Blood culture (routine x 2)     Status: None (Preliminary result)   Collection Time: 11/10/23  1:39 AM   Specimen: BLOOD  Result Value Ref Range Status   Specimen Description BLOOD RIGHT ANTECUBITAL  Final   Special Requests   Final    BOTTLES DRAWN AEROBIC AND ANAEROBIC Blood Culture adequate volume   Culture   Final    NO GROWTH 3 DAYS Performed at [facility], [address], [place], [place] [address]    Report Status PENDING  Incomplete    Radiology Studies: No results found.     Edwin Cherian T. Braylie Badami [facility]  If 7PM-7AM, please contact night-coverage [email or web address] 11/13/2023, 4:19 PM

## 2023-11-13 NOTE — Progress Notes (Signed)
   11/13/23 2301  BiPAP/CPAP/SIPAP  BiPAP/CPAP/SIPAP Pt Type Adult  BiPAP/CPAP/SIPAP Resmed  Mask Type Full face mask  EPAP 6 cmH2O  FiO2 (%) 21 %  Patient Home Machine No  Patient Home Mask No  Patient Home Tubing No  Auto Titrate No  Device Plugged into RED Power Outlet Yes  BiPAP/CPAP /SiPAP Vitals  Pulse Rate 66  Resp 18  SpO2 96 %  Bilateral Breath Sounds Diminished

## 2023-11-13 NOTE — Progress Notes (Signed)
 Mobility Specialist: Progress Note   11/13/23 1518  Mobility  Activity Pivoted/transferred from chair to bed  Level of Assistance Minimal assist, patient does 75% or more  Assistive Device Front wheel walker  Activity Response Tolerated well  Mobility Referral Yes  Mobility visit 1 Mobility  Mobility Specialist Start Time (ACUTE ONLY) 1259  Mobility Specialist Stop Time (ACUTE ONLY) 1306  Mobility Specialist Time Calculation (min) (ACUTE ONLY) 7 min    Pt received in chair, requesting assistance back to bed. MinA for STS and stand pivot to chair. C/o nausea. Left in bed with all needs met, call bell in reach.   Ileana Lute Mobility Specialist Please contact via SecureChat or Rehab office at [phone]

## 2023-11-13 NOTE — Plan of Care (Signed)
   Problem: Education: Goal: Knowledge of General Education information will improve Description: Including pain rating scale, medication(s)/side effects and non-pharmacologic comfort measures Outcome: Progressing   Problem: Pain Managment: Goal: General experience of comfort will improve and/or be controlled Outcome: Progressing   Problem: Safety: Goal: Ability to remain free from injury will improve Outcome: Progressing

## 2023-11-13 NOTE — Progress Notes (Signed)
 Mobility Specialist: Progress Note   11/13/23 1500  Mobility  Activity Pivoted/transferred from bed to chair  Level of Assistance Moderate assist, patient does 50-74%  Assistive Device Front wheel walker  Activity Response Tolerated well  Mobility Referral Yes  Mobility visit 1 Mobility  Mobility Specialist Start Time (ACUTE ONLY) 1034  Mobility Specialist Stop Time (ACUTE ONLY) 1051  Mobility Specialist Time Calculation (min) (ACUTE ONLY) 17 min    Pt received in bed, agreeable to mobility session. SV for bed mobility. Ms donned RLE post op shoe at EOB. ModA for STS and stand pivot to chair. Pt unable to hop at this time but she was able to scoot her R foot to complete the pivot. Pt starting vomiting mid pivot and required minA to guide herself down to the chair. RN notified of emesis. Left in chair with all needs met, call bell in reach.   Ileana Lute Mobility Specialist Please contact via SecureChat or Rehab office at [phone]

## 2023-11-14 ENCOUNTER — Encounter (HOSPITAL_COMMUNITY)
Admission: EM | Disposition: A | Discharge status: Home / Self Care | Payer: Self-pay | Source: Home / Self Care | Attending: Student

## 2023-11-14 DIAGNOSIS — I7102 Dissection of abdominal aorta: Secondary | ICD-10-CM

## 2023-11-14 DIAGNOSIS — E11621 Type 2 diabetes mellitus with foot ulcer: Secondary | ICD-10-CM | POA: Diagnosis not present

## 2023-11-14 DIAGNOSIS — L89220 Pressure ulcer of left hip, unstageable: Secondary | ICD-10-CM | POA: Diagnosis not present

## 2023-11-14 DIAGNOSIS — L97412 Non-pressure chronic ulcer of right heel and midfoot with fat layer exposed: Secondary | ICD-10-CM

## 2023-11-14 DIAGNOSIS — I714 Abdominal aortic aneurysm, without rupture, unspecified: Secondary | ICD-10-CM

## 2023-11-14 DIAGNOSIS — I25119 Atherosclerotic heart disease of native coronary artery with unspecified angina pectoris: Secondary | ICD-10-CM | POA: Diagnosis not present

## 2023-11-14 DIAGNOSIS — E8809 Other disorders of plasma-protein metabolism, not elsewhere classified: Secondary | ICD-10-CM | POA: Diagnosis not present

## 2023-11-14 HISTORY — PX: ABDOMINAL AORTOGRAM: CATH118222

## 2023-11-14 HISTORY — PX: LOWER EXTREMITY ANGIOGRAPHY: CATH118251

## 2023-11-14 LAB — CBC
HCT: 33.9 % — ABNORMAL LOW (ref 36.0–46.0)
Hemoglobin: 11 g/dL — ABNORMAL LOW (ref 12.0–15.0)
MCH: 30.2 pg (ref 26.0–34.0)
MCHC: 32.4 g/dL (ref 30.0–36.0)
MCV: 93.1 fL (ref 80.0–100.0)
Platelets: 150 K/uL (ref 150–400)
RBC: 3.64 MIL/uL — ABNORMAL LOW (ref 3.87–5.11)
RDW: 13.1 % (ref 11.5–15.5)
WBC: 7.1 K/uL (ref 4.0–10.5)
nRBC: 0 % (ref 0.0–0.2)

## 2023-11-14 LAB — BASIC METABOLIC PANEL WITH GFR
Anion gap: 10 (ref 5–15)
BUN: 16 mg/dL (ref 6–20)
CO2: 26 mmol/L (ref 22–32)
Calcium: 9.3 mg/dL (ref 8.9–10.3)
Chloride: 101 mmol/L (ref 98–111)
Creatinine, Ser: 0.91 mg/dL (ref 0.44–1.00)
GFR, Estimated: >60 mL/min (ref >60)
Glucose, Bld: 205 mg/dL — ABNORMAL HIGH (ref 70–99)
Potassium: 4.3 mmol/L (ref 3.5–5.1)
Sodium: 137 mmol/L (ref 135–145)

## 2023-11-14 LAB — GLUCOSE, CAPILLARY
Glucose-Capillary: 236 mg/dL — ABNORMAL HIGH (ref 70–99)
Glucose-Capillary: 174 mg/dL — ABNORMAL HIGH (ref 70–99)
Glucose-Capillary: 257 mg/dL — ABNORMAL HIGH (ref 70–99)

## 2023-11-14 LAB — MAGNESIUM: Magnesium: 1.9 mg/dL (ref 1.7–2.4)

## 2023-11-14 MED ORDER — FENTANYL CITRATE (PF) 100 MCG/2ML IJ SOLN
INTRAMUSCULAR | Status: DC | PRN
  Administered 2023-11-14: 50 ug via INTRAVENOUS

## 2023-11-14 MED ORDER — CHOLECALCIFEROL 1.25 MG (50000 UT) PO CAPS
50000.0000 [IU] | ORAL_CAPSULE | ORAL | Status: DC
Start: 2023-11-14 — End: 2023-11-14

## 2023-11-14 MED ORDER — TRAMADOL HCL 50 MG PO TABS
50.0000 mg | ORAL_TABLET | Freq: Three times a day (TID) | ORAL | Status: DC
  Administered 2023-11-14 – 2023-11-15 (×2): 50 mg via ORAL
  Filled 2023-11-14 (×2): qty 1

## 2023-11-14 MED ORDER — MIDAZOLAM HCL 2 MG/2ML IJ SOLN
INTRAMUSCULAR | Status: AC
  Filled 2023-11-14: qty 2

## 2023-11-14 MED ORDER — MIDAZOLAM HCL 2 MG/2ML IJ SOLN
INTRAMUSCULAR | Status: DC | PRN
  Administered 2023-11-14: 1 mg via INTRAVENOUS

## 2023-11-14 MED ORDER — FENTANYL CITRATE (PF) 100 MCG/2ML IJ SOLN
INTRAMUSCULAR | Status: AC
  Filled 2023-11-14: qty 2

## 2023-11-14 MED ORDER — IODIXANOL 320 MG/ML IV SOLN
INTRAVENOUS | Status: DC | PRN
  Administered 2023-11-14: 50 mL

## 2023-11-14 MED ORDER — SODIUM CHLORIDE 0.9% FLUSH: 3.0000 mL | INTRAVENOUS | Status: DC | PRN

## 2023-11-14 MED ORDER — ACETAMINOPHEN 325 MG PO TABS
650.0000 mg | ORAL_TABLET | ORAL | Status: DC | PRN
  Administered 2023-11-14: 650 mg via ORAL

## 2023-11-14 MED ORDER — VITAMIN D (ERGOCALCIFEROL) 1.25 MG (50000 UNIT) PO CAPS
50000.0000 [IU] | ORAL_CAPSULE | ORAL | Status: DC
  Administered 2023-11-14: 50000 [IU] via ORAL
  Filled 2023-11-14: qty 1

## 2023-11-14 MED ORDER — HYDRALAZINE HCL 20 MG/ML IJ SOLN: 5.0000 mg | INTRAMUSCULAR | Status: DC | PRN

## 2023-11-14 MED ORDER — FLUCONAZOLE 150 MG PO TABS
150.0000 mg | ORAL_TABLET | Freq: Once | ORAL | Status: AC
  Administered 2023-11-14: 150 mg via ORAL
  Filled 2023-11-14: qty 1

## 2023-11-14 MED ORDER — SODIUM CHLORIDE 0.9 % IV SOLN: INTRAVENOUS | Status: AC

## 2023-11-14 MED ORDER — INSULIN ASPART 100 UNIT/ML IJ SOLN
10.0000 [IU] | Freq: Three times a day (TID) | INTRAMUSCULAR | Status: DC
  Administered 2023-11-14 – 2023-11-15 (×3): 10 [IU] via SUBCUTANEOUS

## 2023-11-14 MED ORDER — LEVOCETIRIZINE DIHYDROCHLORIDE 5 MG PO TABS: 5.0000 mg | ORAL_TABLET | Freq: Every day | ORAL | Status: DC

## 2023-11-14 MED ORDER — LIDOCAINE HCL (PF) 1 % IJ SOLN
INTRAMUSCULAR | Status: AC
  Filled 2023-11-14: qty 30

## 2023-11-14 MED ORDER — TORSEMIDE 20 MG PO TABS
40.0000 mg | ORAL_TABLET | Freq: Every day | ORAL | Status: DC
  Administered 2023-11-14 – 2023-11-15 (×2): 40 mg via ORAL
  Filled 2023-11-14 (×2): qty 2

## 2023-11-14 MED ORDER — LABETALOL HCL 5 MG/ML IV SOLN: 10.0000 mg | INTRAVENOUS | Status: DC | PRN

## 2023-11-14 MED ORDER — SODIUM CHLORIDE 0.9 % IV SOLN: 250.0000 mL | INTRAVENOUS | Status: AC | PRN

## 2023-11-14 MED ORDER — LORATADINE 10 MG PO TABS
10.0000 mg | ORAL_TABLET | Freq: Every day | ORAL | Status: DC
Start: 2023-11-14 — End: 2023-11-15
  Administered 2023-11-14 – 2023-11-15 (×2): 10 mg via ORAL
  Filled 2023-11-14 (×2): qty 1

## 2023-11-14 MED ORDER — ACETAMINOPHEN 325 MG PO TABS
ORAL_TABLET | ORAL | Status: AC
  Filled 2023-11-14: qty 2

## 2023-11-14 MED ORDER — LIDOCAINE HCL (PF) 1 % IJ SOLN
INTRAMUSCULAR | Status: DC | PRN
  Administered 2023-11-14: 15 mL

## 2023-11-14 MED ORDER — HEPARIN (PORCINE) IN NACL 1000-0.9 UT/500ML-% IV SOLN
INTRAVENOUS | Status: DC | PRN
  Administered 2023-11-14 (×4): 500 mL

## 2023-11-14 MED ORDER — SODIUM CHLORIDE 0.9% FLUSH
3.0000 mL | Freq: Two times a day (BID) | INTRAVENOUS | Status: DC
  Administered 2023-11-14 – 2023-11-15 (×3): 3 mL via INTRAVENOUS

## 2023-11-14 NOTE — Inpatient Diabetes Management (Signed)
 Inpatient Diabetes Program Recommendations  AACE/ADA: New Consensus Statement on Inpatient Glycemic Control (2015)  Target Ranges:  Prepandial:   less than 140 mg/dL      Peak postprandial:   less than 180 mg/dL (1-2 hours)      Critically ill patients:  140 - 180 mg/dL   Lab Results  Component Value Date   GLUCAP 174 (H) 11/14/2023   HGBA1C 13.2 (H) 11/10/2023    Review of Glycemic Control  Latest Reference Range & Units 11/13/23 12:27 11/13/23 17:01 11/13/23 21:02 11/14/23 08:38 11/14/23 11:25  Glucose-Capillary 70 - 99 mg/dL 743 (H) 735 (H) 808 (H) 236 (H) 174 (H)   Diabetes history: DM 2 Outpatient Diabetes medications:  U500 70 units tid with meals Current orders for Inpatient glycemic control:  Novolog  0-20 units tid with meals and HS Novolog  8 units tid with meals (added today) Lantus  40 units daily and Lantus  25 units q HS  Inpatient Diabetes Program Recommendations:    Note patient is currently NPO and in procedure.  Once eating again, consider increasing Novolog  meal coverage to 12 units tid with meals.  -Of note, patient was supposed to be taking U500 insulin  at home (which is likely best for this patient to to large amounts of insulin  needed to control blood sugars).  At discharge, recommend restart of U500 insulin .   Thanks,  Randall Bullocks, RN, BC-ADM Inpatient Diabetes Coordinator Pager [phone]

## 2023-11-14 NOTE — Progress Notes (Signed)
 Patient arrived from PACU, AO x4. CHG bath completed. Connected to tele and CCMD notified. Oriented pt to room and call bell system. Call bell within reach, plan of care continues.

## 2023-11-14 NOTE — Progress Notes (Signed)
 PROGRESS NOTE  [name]:[id number] DOB: [birth year]   PCP: Shona Norleen PEDLAR, MD  Patient is from: Home.  Lives with daughter.  Has wheelchair and rolling walker for mobility.  DOA: 11/09/2023 LOS: 4  Chief complaints Chief Complaint  Patient presents with   Wound Check     Brief Narrative / Interim history: 58 year old F with PMH of COPD, CAD, IDDM-2, OSA on CPAP but not working, A-fib not on [facility], morbid obesity, left BKA, HTN, tremor, HLD, GERD and prior tobacco use presented to [facility] with worsening right heel wound with pain, swelling pus drainage for about a week or 2, and admitted to [facility] with diabetic right heel ulcer after consultation with podiatry.  In ED, stable vitals.  CMP glucose 389.  Hgb 11.3.  WBC 7.6.  A1c 13.2.  Right foot x-ray negative for osteomyelitis.  Cultures obtained.  Started on Zosyn  and vancomycin .  Podiatry consulted.  MRI ordered.  Transferred to [facility] for further care.    MRI negative for abscess or osteomyelitis but concerning for cellulitis.  ABI with mild disease.  VVS consulted.  Angiogram without significant finding.  Podiatry recommended p.o. antibiotics on discharge and outpatient follow-up.  Subjective: Seen and examined earlier this afternoon after she returned from vascular procedure.  No complaints.  Objective: Vitals:   11/14/23 1215 11/14/23 1230 11/14/23 1300 11/14/23 1347  BP: (!) 154/79 (!) 149/60 (!) 145/81 (!) 119/90  Pulse: (!) 59 61 (!) 58 61  Resp: 15 14 18 19   Temp:    97.9 F (36.6 C)  TempSrc:    Oral  SpO2: 96% 96% 96% 100%  Weight:      Height:        Examination:  GENERAL: No apparent distress.  Nontoxic. HEENT: MMM.  Vision and hearing grossly intact.  NECK: Supple.  No apparent JVD.  RESP:  No IWOB.  Fair aeration bilaterally. CVS:  RRR. Heart sounds normal.  ABD/GI/GU: BS+. Abd soft, NTND.  MSK/EXT: Right heel ulceration.  No apparent drainage.  See picture under media.  2+ DP pulse in  right foot.  Not able to palpate PT pulse.  Left BKA SKIN: Left thigh ulcer with no purulent drainage or surrounding skin erythema.  See picture below NEURO: AA.  Oriented appropriately.  No apparent focal neuro deficit. PSYCH: Calm. Normal affect.   Consultants:  Podiatry Vascular surgery  Procedures: 9/22-lower extremity angiogram without significant finding.  Microbiology summarized: Blood cultures NGTD  Assessment and plan: Diabetic foot ulcer with infection/cellulitis: Worsening right heel ulcer with some drainage, pain and swelling for 1 to 2 weeks.  No constitutional symptoms.  No leukocytosis.  MRI negative for abscess or osteomyelitis but concerning for cellulitis.  ABI with mild disease in RLE.  Lower extremity angiogram without significant finding -Vancomycin  and Zosyn >> Zyvox , cefepime  and Flagyl  -Podiatry recommended discharge on oral antibiotics, wound care and outpatient follow-up -Glycemic and pain control -HHPT/OT/RN ordered    Uncontrolled IDDM-2 with hyperglycemia: A1c 13.2% (was 13.5% in 02/2023.  Per last endocrinology note in 02/2023, she was supposed to be on 70 units 3 times daily with meals. Per daughter, struggle to make her use her insulin .  Also out of her Ozempic .  Supposed to be on SSI from 50 to 90 units 3 times daily but just started using 1 week ago.  Not on basal insulin .   Recent Labs  Lab 11/13/23 1227 11/13/23 1701 11/13/23 2102 11/14/23 0838 11/14/23 1125  GLUCAP 256* 264*  191* 236* 174*  -Continue SSI-resistant scale -Continue basal insulin  40 units in the morning and 25 units at night -Increase NovoLog  from 8 to 10 units 3 times daily with meals -Carb modified diet. -Further adjustment as appropriate. -She may need to restart Ozempic  at low-dose on discharge -Diabetic coordinator consulted.  History of CAD s/p DES to RCA in 2006 and another 2 stents in 2009.  LHC in 2023 with nonobstructive CAD.  No cardiopulmonary symptoms. -Continue  home Plavix , metoprolol  and Crestor .  OSA on CPAP: She says home CPAP is not working well.  Trying to get a new one. -CPAP at night  Tremor: Per family, going on for 1 year and has not been investigated.  Looks like a resting tremor. -Started low-dose gabapentin .  Could help with diabetic neuropathy as well -Needs outpatient follow-up with neurology  Mood disorder: Stable - Continue home BuSpar   Left BKA: Uses wheelchair and rolling walker at baseline.  GERD -Continue Protonix   Morbid obesity Body mass index is 43.25 kg/m. - Needs to restart her Ozempic  on discharge.   Left thigh pressure ulcer: No signs of infection. -Continue wound care     Wound 11/10/23 0337 Pressure Injury Hip Left;Lateral (Active)   DVT prophylaxis:  On subcu Lovenox   Code Status: Full code Family Communication: Updated patient's daughter, Shanda over the phone Level of care: Med-Surg Status is: Inpatient Remains inpatient appropriate because: Diabetic foot infection   Final disposition: Home with home health on 9/23   55 minutes with more than 50% spent in reviewing records, counseling patient/family and coordinating care.   Sch Meds:  Scheduled Meds:  acetaminophen        busPIRone   5 mg Oral BID   cariprazine   3 mg Oral Daily   clopidogrel   75 mg Oral Daily   enoxaparin  (LOVENOX ) injection  55 mg Subcutaneous Q24H   feeding supplement (GLUCERNA SHAKE)  237 mL Oral TID BM   gabapentin   200 mg Oral QHS   insulin  aspart  0-20 Units Subcutaneous TID WC   insulin  aspart  0-5 Units Subcutaneous QHS   insulin  aspart  8 Units Subcutaneous TID WC   insulin  glargine  40 Units Subcutaneous Daily   And   insulin  glargine  25 Units Subcutaneous QHS   leptospermum manuka honey  1 Application Topical Daily   metoprolol  tartrate  25 mg Oral BID   metroNIDAZOLE   500 mg Oral Q12H   nystatin    Topical BID   pantoprazole   40 mg Oral Daily   rosuvastatin   40 mg Oral QHS   sodium chloride  flush  3  mL Intravenous Q12H   Continuous Infusions:  sodium chloride      ceFEPime  (MAXIPIME ) IV 2 g (11/14/23 0914)   linezolid  (ZYVOX ) IV 600 mg (11/13/23 2203)   PRN Meds:.sodium chloride , acetaminophen , acetaminophen , hydrALAZINE , HYDROmorphone  (DILAUDID ) injection, labetalol , metoCLOPramide  (REGLAN ) injection, oxyCODONE , [COMPLETED] polyethylene glycol **FOLLOWED BY** polyethylene glycol, [COMPLETED] senna-docusate **FOLLOWED BY** senna-docusate, sodium chloride  flush  Antimicrobials: Anti-infectives (From admission, onward)    Start     Dose/Rate Route Frequency Ordered Stop   11/13/23 1000  linezolid  (ZYVOX ) IVPB 600 mg        600 mg 300 mL/hr over 60 Minutes Intravenous Every 12 hours 11/13/23 0848     11/13/23 1000  metroNIDAZOLE  (FLAGYL ) tablet 500 mg        500 mg Oral Every 12 hours 11/13/23 0848     11/13/23 0945  ceFEPIme  (MAXIPIME ) 2 g in sodium chloride  0.9 % 100  mL IVPB        2 g 200 mL/hr over 30 Minutes Intravenous Every 8 hours 11/13/23 0848     11/10/23 1600  vancomycin  (VANCOREADY) IVPB 1500 mg/300 mL  Status:  Discontinued        1,500 mg 150 mL/hr over 120 Minutes Intravenous Every 24 hours 11/10/23 0559 11/13/23 0846   11/10/23 0900  piperacillin -tazobactam (ZOSYN ) IVPB 3.375 g  Status:  Discontinued        3.375 g 12.5 mL/hr over 240 Minutes Intravenous Every 8 hours 11/10/23 0559 11/13/23 0846   11/10/23 0145  piperacillin -tazobactam (ZOSYN ) IVPB 3.375 g        3.375 g 100 mL/hr over 30 Minutes Intravenous  Once 11/10/23 0133 11/10/23 0410   11/10/23 0130  vancomycin  (VANCOCIN ) IVPB 1000 mg/200 mL premix        1,000 mg 200 mL/hr over 60 Minutes Intravenous  Once 11/10/23 0115 11/10/23 0308        I have personally reviewed the following labs and images: CBC: Recent Labs  Lab 11/10/23 0137 11/11/23 0332 11/12/23 0731  WBC 7.6 8.8 7.1  NEUTROABS 3.8  --   --   HGB 11.3* 11.1* 11.2*  HCT 35.5* 33.4* 33.9*  MCV 94.4 92.0 91.4  PLT 158 154 156   BMP  &GFR Recent Labs  Lab 11/10/23 0137 11/11/23 0332 11/12/23 0731 11/14/23 0356  NA 138 137 136 137  K 4.2 4.1 3.9 4.3  CL 100 97* 99 101  CO2 28 26 25 26   GLUCOSE 389* 201* 279* 205*  BUN 18 17 20 16   CREATININE 0.78 0.90 1.00 0.91  CALCIUM  9.1 9.1 9.2 9.3  MG  --   --  1.6* 1.9  PHOS  --   --  4.2  --    Estimated Creatinine Clearance: 82.5 mL/min (by C-G formula based on SCr of 0.91 mg/dL). Liver & Pancreas: Recent Labs  Lab 11/10/23 0137 11/12/23 0731  AST 19  --   ALT 12  --   ALKPHOS 77  --   BILITOT 0.5  --   PROT 6.7  --   ALBUMIN 3.2* 2.9*   No results for input(s): LIPASE, AMYLASE in the last 168 hours. No results for input(s): AMMONIA in the last 168 hours. Diabetic: No results for input(s): HGBA1C in the last 72 hours.  Recent Labs  Lab 11/13/23 1227 11/13/23 1701 11/13/23 2102 11/14/23 0838 11/14/23 1125  GLUCAP 256* 264* 191* 236* 174*   Cardiac Enzymes: No results for input(s): CKTOTAL, CKMB, CKMBINDEX, TROPONINI in the last 168 hours. No results for input(s): PROBNP in the last 8760 hours. Coagulation Profile: No results for input(s): INR, PROTIME in the last 168 hours. Thyroid  Function Tests: No results for input(s): TSH, T4TOTAL, FREET4, T3FREE, THYROIDAB in the last 72 hours. Lipid Profile: No results for input(s): CHOL, HDL, LDLCALC, TRIG, CHOLHDL, LDLDIRECT in the last 72 hours. Anemia Panel: No results for input(s): VITAMINB12, FOLATE, FERRITIN, TIBC, IRON, RETICCTPCT in the last 72 hours. Urine analysis:    Component Value Date/Time   COLORURINE STRAW (A) 06/16/2021 1656   APPEARANCEUR CLEAR 06/16/2021 1656   LABSPEC 1.020 06/16/2021 1656   PHURINE 6.0 06/16/2021 1656   GLUCOSEU >=500 (A) 06/16/2021 1656   HGBUR NEGATIVE 06/16/2021 1656   BILIRUBINUR NEGATIVE 06/16/2021 1656   KETONESUR NEGATIVE 06/16/2021 1656   PROTEINUR 100 (A) 06/16/2021 1656   UROBILINOGEN 1.0  02/18/2011 2336   NITRITE NEGATIVE 06/16/2021 1656   LEUKOCYTESUR SMALL (A)  06/16/2021 1656   Sepsis Labs: Invalid input(s): PROCALCITONIN, LACTICIDVEN  Microbiology: Recent Results (from the past 240 hours)  Blood culture (routine x 2)     Status: None (Preliminary result)   Collection Time: 11/10/23  1:37 AM   Specimen: BLOOD  Result Value Ref Range Status   Specimen Description BLOOD BLOOD RIGHT ARM  Final   Special Requests   Final    BOTTLES DRAWN AEROBIC AND ANAEROBIC Blood Culture adequate volume   Culture   Final    NO GROWTH 4 DAYS Performed at [facility], [address], [place], [place] [address]    Report Status PENDING  Incomplete  Blood culture (routine x 2)     Status: None (Preliminary result)   Collection Time: 11/10/23  1:39 AM   Specimen: BLOOD  Result Value Ref Range Status   Specimen Description BLOOD RIGHT ANTECUBITAL  Final   Special Requests   Final    BOTTLES DRAWN AEROBIC AND ANAEROBIC Blood Culture adequate volume   Culture   Final    NO GROWTH 4 DAYS Performed at [facility], [address] [place], [place] [address]    Report Status PENDING  Incomplete    Radiology Studies: PERIPHERAL VASCULAR CATHETERIZATION Result Date: 11/14/2023 Patient name: BEVERLY FERNER MRN: [id number] DOB: 1965-08-27 Sex: female 11/14/2023 Pre-operative Diagnosis: Right heel ulceration Post-operative diagnosis:  Same Surgeon:  Penne C. Sheree, MD Procedure Performed: 1.  Percutaneous ultrasound-guided cannulation and Mynx device closure left common femoral artery 2.  Catheter selection of aorta and aortogram with bilateral lower extremity angiography 3.  Catheter selection right common femoral artery 4.  Moderate sedation fentanyl  and Versed  18 minutes Indications: 58 year old female with history of left below-knee amputation now with right heel ulceration.  She is not ambulatory but does use the right leg for mobilization.  She is indicated for angiography with possible  intervention as she has mildly depressed ABI. Findings: The aorta is patent with 2 renal arteries on the right and 1 on the left which are also patent.  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  The iliac segments are all patent.  Bilateral common femoral arteries branching to profundus and superficial femoral arteries which are patent.  The right side is the site of interest there is brisk flow through 3 vessels and the posterior tibial artery is noted to be filling the heel.  No intervention was undertaken.  Procedure:  The patient was identified in the holding area and taken to room 8.  The patient was then placed supine on the table and prepped and draped in the usual sterile fashion.  A time out was called.  Ultrasound was used to evaluate the left common femoral artery.  This was patent.  The area was anesthetized with 1% lidocaine  and cannulated with a micropuncture needle followed by wire and sheath using direct ultrasound visualization.  An ultrasound images saved to the permanent record.  Concomitantly we administered fentanyl  and Versed  with moderate sedation her vital signs were monitored throughout the case.  We placed a Bentson wire followed by 5 [place] sheath and Omni catheter to the level of L1 and aortogram was performed we then pulled the catheter to the bifurcation performed pelvic angiography with limited bilateral lower extremity angiography.  We then crossed the bifurcation with Bentson and Omni catheter perform right lower extremity angiography.  With the above findings no intervention was undertaken.  The catheter and wire were removed  and a Mynx device was deployed.  She tolerated the procedure without any complication. Contrast: 50cc Brandon C. Sheree, MD [facility] Office: [phone] Pager: [phone]       Hadyn Blanck T. Kiala Faraj [facility]  If 7PM-7AM, please contact  night-coverage [email or web address] 11/14/2023, 3:35 PM

## 2023-11-14 NOTE — Progress Notes (Signed)
 OT Cancellation Note  Patient Details Name: Nicole [name] MRN: [id number] DOB: [birth year]   Cancelled Treatment:    Reason Eval/Treat Not Completed: Patient at procedure or test/ unavailable (Pt off unit in Cath Lab for a procedure. OT to reattempt to see pt at a later time as appropriate/available.)  Margarie Rockey [name], OTR/L, MA Acute Rehab [phone]   Margarie [name] 11/14/2023, 10:10 AM

## 2023-11-14 NOTE — Care Management Important Message (Addendum)
 Important Message  Patient Details  Name: Nicole [name] MRN: [id number] Date of Birth: March 01, 1965   Important Message Given:  Yes - Medicare IM  Patient left prior to IM delivery will mail a copy to the patient home address.    Lorrinda Ramstad 11/14/2023, 3:56 PM

## 2023-11-14 NOTE — Progress Notes (Signed)
 PODIATRY PROGRESS NOTE Patient Name: Nicole [name]  DOB September 02, 1965 DOA 11/09/2023  Hospital Day: 6  Assessment:  58 y.o. female with PMHx significant for  hypertension, hyperlipidemia, GERD, CAD, T2DM, tremors, left BKA  with cellulitis ulceration Right posterior/medial heel 2/2 neuropathy and suspected PAD - no evidence of abscess or osteomyelitis on MRI.   A1c 13.2 ESR/CRP: elevated with ESR 40, CRP 2.5    MRI R foot: 1. Soft tissue ulceration/wound at the medial heel with surrounding soft tissue edema, enhancement and cutaneous thickening, most pronounced along the anteromedial margin. These findings are compatible with cellulitis. No loculated fluid collection. 2. No evidence of osteomyelitis.   ABI right leg  Right: Resting right ankle-brachial index indicates mild right lower  extremity arterial disease. The right toe-brachial index is abnormal.   Plan:  - No debridement indicated at this time. Will proceed with continued local wound care as currently ordered - Home health ordered for 2x weekly rn dressing change, recommend continuation of current wound care order - Appreciate vascular surgery, no intervention required on RLE, 3 vessel runoff. - Strict glycemic control is required to promote wound healing - she remains at risk of limb loss 2/2 uncontrolled DM and neuropathy  - Recommend 7 days Augmentin  and doxycyline on discharge - WBAT in post op shoe for transfers/ short distance, prevalon boot should be used while in bed  - patient should follow up in office in 1-2 weeks, office to call to arrange, will sign off as pt stable for DC from podiatry standpoint.         Nicole [name], DPM [facility]    Subjective:  Discussed plan for ongoing wound care and follow up plans, patient in agreement.   Objective:   Vitals:   11/14/23 1300 11/14/23 1347  BP: (!) 145/81 (!) 119/90  Pulse: (!) 58 61  Resp: 18 19  Temp:  97.9 F (36.6 C)  SpO2: 96% 100%        Latest Ref Rng & Units 11/12/2023    7:31 AM 11/11/2023    3:32 AM 11/10/2023    1:37 AM  CBC  WBC 4.0 - 10.5 K/uL 7.1  8.8  7.6   Hemoglobin 12.0 - 15.0 g/dL 88.7  88.8  88.6   Hematocrit 36.0 - 46.0 % 33.9  33.4  35.5   Platelets 150 - 400 K/uL 156  154  158        Latest Ref Rng & Units 11/14/2023    3:56 AM 11/12/2023    7:31 AM 11/11/2023    3:32 AM  BMP  Glucose 70 - 99 mg/dL 794  720  798   BUN 6 - 20 mg/dL 16  20  17    Creatinine 0.44 - 1.00 mg/dL 9.08  8.99  9.09   Sodium 135 - 145 mmol/L 137  136  137   Potassium 3.5 - 5.1 mmol/L 4.3  3.9  4.1   Chloride 98 - 111 mmol/L 101  99  97   CO2 22 - 32 mmol/L 26  25  26    Calcium  8.9 - 10.3 mg/dL 9.3  9.2  9.1     General: AAOx3, NAD  Lower Extremity Exam    R foot non palpable DP and PT pulses.   R medial posterior heel necrotic ulceration with firm black eschar overlying with peeling skin surrounding, mild peri wound maceraiton/ fibronecrotic tissues. No purulence and no malodor at present.   Sensation  absent to light touch      Radiology:  Results reviewed. See assessment for pertinent imaging results

## 2023-11-14 NOTE — Op Note (Signed)
    Patient name: Nicole [name] MRN: [id number] DOB: Oct 28, 1965 Sex: female  11/14/2023 Pre-operative Diagnosis: Right heel ulceration Post-operative diagnosis:  Same Surgeon:  Penne C. Sheree, MD Procedure Performed: 1.  Percutaneous ultrasound-guided cannulation and Mynx device closure left common femoral artery 2.  Catheter selection of aorta and aortogram with bilateral lower extremity angiography 3.  Catheter selection right common femoral artery 4.  Moderate sedation fentanyl  and Versed  18 minutes  Indications: 58 year old female with history of left below-knee amputation now with right heel ulceration.  She is not ambulatory but does use the right leg for mobilization.  She is indicated for angiography with possible intervention as she has mildly depressed ABI.  Findings: The aorta is patent with 2 renal arteries on the right and 1 on the left which are also patent.  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  The iliac segments are all patent.  Bilateral common femoral arteries branching to profundus and superficial femoral arteries which are patent.  The right side is the site of interest there is brisk flow through 3 vessels and the posterior tibial artery is noted to be filling the heel.  No intervention was undertaken.   Procedure:  The patient was identified in the holding area and taken to room 8.  The patient was then placed supine on the table and prepped and draped in the usual sterile fashion.  A time out was called.  Ultrasound was used to evaluate the left common femoral artery.  This was patent.  The area was anesthetized with 1% lidocaine  and cannulated with a micropuncture needle followed by wire and sheath using direct ultrasound visualization.  An ultrasound images saved to the permanent record.  Concomitantly we administered fentanyl  and Versed  with moderate sedation her vital signs were monitored throughout the case.  We placed a  Bentson wire followed by 5 [place] sheath and Omni catheter to the level of L1 and aortogram was performed we then pulled the catheter to the bifurcation performed pelvic angiography with limited bilateral lower extremity angiography.  We then crossed the bifurcation with Bentson and Omni catheter perform right lower extremity angiography.  With the above findings no intervention was undertaken.  The catheter and wire were removed and a Mynx device was deployed.  She tolerated the procedure without any complication.   Contrast: 50cc  Deadra Diggins C. Sheree, MD [facility] [place] Office: [phone] Pager: [phone]

## 2023-11-14 NOTE — Progress Notes (Signed)
  Progress Note    11/14/2023 9:59 AM * Day of Surgery *  Subjective: No overnight issues  Vitals:   11/14/23 0439 11/14/23 0823  BP: (!) 156/62 (!) 118/52  Pulse: 63 (!) 58  Resp: 18 18  Temp: 98.4 F (36.9 C) 98 F (36.7 C)  SpO2: 99% 99%    Physical Exam: Awake alert oriented Nonlabored respirations Right foot dressing in place  CBC    Component Value Date/Time   WBC 7.1 11/12/2023 0731   RBC 3.71 (L) 11/12/2023 0731   HGB 11.2 (L) 11/12/2023 0731   HCT 33.9 (L) 11/12/2023 0731   PLT 156 11/12/2023 0731   MCV 91.4 11/12/2023 0731   MCH 30.2 11/12/2023 0731   MCHC 33.0 11/12/2023 0731   RDW 13.1 11/12/2023 0731   LYMPHSABS 3.0 11/10/2023 0137   MONOABS 0.7 11/10/2023 0137   EOSABS 0.1 11/10/2023 0137   BASOSABS 0.1 11/10/2023 0137    BMET    Component Value Date/Time   NA 137 11/14/2023 0356   K 4.3 11/14/2023 0356   CL 101 11/14/2023 0356   CO2 26 11/14/2023 0356   GLUCOSE 205 (H) 11/14/2023 0356   BUN 16 11/14/2023 0356   CREATININE 0.91 11/14/2023 0356   CREATININE 0.86 07/30/2020 1558   CALCIUM  9.3 11/14/2023 0356   GFRNONAA >60 11/14/2023 0356   GFRNONAA 76 07/30/2020 1558   GFRAA 88 07/30/2020 1558    INR    Component Value Date/Time   INR 1.4 (H) 01/23/2020 0421     Intake/Output Summary (Last 24 hours) at 11/14/2023 0959 Last data filed at 11/14/2023 9351 Gross per 24 hour  Intake --  Output 2850 ml  Net -2850 ml     Assessment:  58 y.o. female is here with right foot wound history of left BKA.  Plan: Cath Lab today for angiography possible intervention.  She demonstrates good understanding of risk benefits and alternatives.  Consent was signed.   Dajon Rowe C. Sheree, MD [facility] [place] Office: [phone] Pager: [phone]  11/14/2023 9:59 AM

## 2023-11-15 ENCOUNTER — Other Ambulatory Visit (HOSPITAL_COMMUNITY): Payer: Self-pay

## 2023-11-15 ENCOUNTER — Encounter: Payer: Self-pay | Admitting: Podiatry

## 2023-11-15 ENCOUNTER — Encounter (HOSPITAL_COMMUNITY): Payer: Self-pay | Admitting: Vascular Surgery

## 2023-11-15 ENCOUNTER — Ambulatory Visit: Admitting: Nurse Practitioner

## 2023-11-15 DIAGNOSIS — I714 Abdominal aortic aneurysm, without rupture, unspecified: Secondary | ICD-10-CM

## 2023-11-15 DIAGNOSIS — E1165 Type 2 diabetes mellitus with hyperglycemia: Secondary | ICD-10-CM | POA: Diagnosis not present

## 2023-11-15 DIAGNOSIS — E66813 Obesity, class 3: Secondary | ICD-10-CM | POA: Diagnosis not present

## 2023-11-15 DIAGNOSIS — K219 Gastro-esophageal reflux disease without esophagitis: Secondary | ICD-10-CM | POA: Diagnosis not present

## 2023-11-15 DIAGNOSIS — I7102 Dissection of abdominal aorta: Secondary | ICD-10-CM

## 2023-11-15 DIAGNOSIS — E11621 Type 2 diabetes mellitus with foot ulcer: Secondary | ICD-10-CM | POA: Diagnosis not present

## 2023-11-15 LAB — CULTURE, BLOOD (ROUTINE X 2)
Culture: NO GROWTH
Culture: NO GROWTH
Special Requests: ADEQUATE
Special Requests: ADEQUATE

## 2023-11-15 LAB — GLUCOSE, CAPILLARY
Glucose-Capillary: 194 mg/dL — ABNORMAL HIGH (ref 70–99)
Glucose-Capillary: 384 mg/dL — ABNORMAL HIGH (ref 70–99)

## 2023-11-15 MED ORDER — DOXYCYCLINE HYCLATE 100 MG PO TABS
100.0000 mg | ORAL_TABLET | Freq: Two times a day (BID) | ORAL | 0 refills | Status: AC
  Filled 2023-11-15: qty 14, 7d supply, fill #0

## 2023-11-15 MED ORDER — AMOXICILLIN-POT CLAVULANATE 875-125 MG PO TABS
1.0000 | ORAL_TABLET | Freq: Two times a day (BID) | ORAL | 0 refills | Status: AC
Start: 2023-11-15 — End: 2023-11-22
  Filled 2023-11-15: qty 14, 7d supply, fill #0

## 2023-11-15 MED ORDER — SENNOSIDES-DOCUSATE SODIUM 8.6-50 MG PO TABS: 1.0000 | ORAL_TABLET | Freq: Two times a day (BID) | ORAL | Status: AC | PRN

## 2023-11-15 MED ORDER — GABAPENTIN 300 MG PO CAPS
300.0000 mg | ORAL_CAPSULE | Freq: Every day | ORAL | 0 refills | Status: DC
  Filled 2023-11-15: qty 30, 30d supply, fill #0

## 2023-11-15 MED ORDER — INSULIN GLARGINE 100 UNIT/ML ~~LOC~~ SOLN
40.0000 [IU] | Freq: Two times a day (BID) | SUBCUTANEOUS | Status: DC
  Administered 2023-11-15: 40 [IU] via SUBCUTANEOUS
  Filled 2023-11-15 (×2): qty 0.4

## 2023-11-15 NOTE — Discharge Summary (Signed)
 Physician Discharge Summary  Nicole [name] FMW:[id number] DOB: 03-22-1965 DOA: 11/09/2023  PCP: Shona Norleen PEDLAR, MD  Admit date: 11/09/2023 Discharge date: 11/15/23  Admitted From: Home Disposition: Home Recommendations for Outpatient Follow-up:  Outpatient follow-up with endocrinology as soon as possible Follow-up with podiatry in 1 week Vascular surgery to arrange outpatient follow Consider referral to neurology for tremor Reassess glycemic control and right heel wound at follow-up Check CMP and CBC at follow-up Please follow up on the following pending results: None  Home Health: [facility] PT/OT Equipment/Devices: Wheelchair  Discharge Condition: Stable CODE STATUS: Full code Diet Orders (From admission, onward)     Start     Ordered   11/15/23 0845  Diet heart healthy/carb modified Room service appropriate? Yes; Fluid consistency: Thin  Diet effective now       Question Answer Comment  Diet-HS Snack? Nothing   Room service appropriate? Yes   Fluid consistency: Thin      11/15/23 0844             Follow-up Information     Shona Norleen PEDLAR, MD. Schedule an appointment as soon as possible for a visit in 1 week(s).   Specialty: Internal Medicine Contact information: [address] [place] [facility] [address] [phone]         Standiford, Marsa JULIANNA, DPM. Schedule an appointment as soon as possible for a visit in 1 week(s).   Specialty: Actuary information: [address] [place] [address] [phone]         Wheelchair supplies Follow up.   Why: Insurance covered wheelchair on 05/25/2021- unable to cover another wheelchair at this time under insurance- if you need to replace leg extentors- look at medical retail supply stores or [employer].                Hospital course 58 year old F with PMH of COPD, CAD, IDDM-2, OSA on CPAP but not working, A-fib not on [facility], morbid obesity, left BKA, HTN, tremor, HLD, GERD and prior  tobacco use presented to [facility] with worsening right heel wound with pain, swelling pus drainage for about a week or 2, and admitted to [facility] with diabetic right heel ulcer after consultation with podiatry.   In ED, stable vitals.  CMP glucose 389.  Hgb 11.3.  WBC 7.6.  A1c 13.2.  Right foot x-ray negative for osteomyelitis.  Cultures obtained.  Started on Zosyn  and vancomycin .  Podiatry consulted.  MRI ordered.  Transferred to [facility] for further care.     MRI negative for abscess or osteomyelitis but concerning for cellulitis.  ABI with mild disease.  VVS consulted.  Angiogram without significant finding.  Podiatry recommended p.o. Augmentin  and doxycycline  for 7 more days, wound care and outpatient follow-up.  Vascular surgery offered BKA but patient refused.  Patient has been counseled on the importance of good glycemic control and close follow-up with her endocrinologist.   [facility] PT/OT and wheelchair ordered.   See individual problem list below for more.   Problems addressed during this hospitalization Diabetic foot ulcer with infection/cellulitis: Worsening right heel ulcer with some drainage, pain and swelling for 1 to 2 weeks.  No constitutional symptoms.  No leukocytosis.  MRI negative for abscess or osteomyelitis but concerning for cellulitis.  ABI with mild disease in RLE.  Lower extremity angiogram without significant finding -Vancomycin  and Zosyn  9/18>> Zyvox , cefepime  and Flagyl  9/21>> Augmentin  and doxycycline  9/23-9/29 -Podiatry recommended discharge on oral antibiotics, wound  care and outpatient follow-up -Vascular surgery offered amputation but patient refused.  Outpatient follow-up -Wound care instruction as below -Glycemic and pain control -HHPT/OT/RN ordered     Uncontrolled IDDM-2 with hyperglycemia: A1c 13.2% (was 13.5% in 02/2023.  Per last endocrinology note in 02/2023, she was supposed to be on 70 units 3 times daily with meals. Per daughter, struggle to make  her use her insulin .  Also out of her Ozempic .  Supposed to be on SSI from 50 to 90 units 3 times daily but just started using 1 week ago.  Not on basal insulin .   -Patient is discharged on home insulin  -Extensively counseled on the importance of good glycemic control  -Encouraged to follow-up with her endocrinologist as soon as possible  -Needs to be restarted on Ozempic     History of CAD s/p DES to RCA in 2006 and another 2 stents in 2009.  LHC in 2023 with nonobstructive CAD.  No cardiopulmonary symptoms. -Continue home Plavix , metoprolol  and Crestor .  Mild right lower extremity PAD-angiography as above. - Plavix  and statin. -Glycemic control   OSA on CPAP: She says home CPAP is not working well.  Trying to get a new one. -CPAP at night   Tremor: Per family, going on for 1 year and has not been investigated.  Looks like a resting tremor. -Started low-dose gabapentin .  Could help with diabetic neuropathy as well -Needs outpatient follow-up with neurology   Mood disorder: Stable - Continue home BuSpar    Left BKA: Uses wheelchair and rolling walker at baseline.   GERD -Continue Protonix    Morbid obesity Body mass index is 43.25 kg/m.  -Reinitiate Ozempic  -Encourage lifestyle change      Left thigh pressure ulcer: No signs of infection. -Continue wound care Wound 11/10/23 0337 Pressure Injury Hip Left;Lateral (Active)     Wound 11/14/23 1345 Pressure Injury Nose Mid Stage 2 -  Partial thickness loss of dermis presenting as a shallow open injury with a red, pink wound bed without slough. (Active)    Consultations: Podiatry Vascular surgery  Time spent 35  minutes  Vital signs Vitals:   11/14/23 2100 11/14/23 2338 11/15/23 0419 11/15/23 0833  BP: (!) 139/52 (!) 139/58 (!) 146/90 (!) 105/49  Pulse: 67 69 75 72  Temp:  98.4 F (36.9 C) 98.2 F (36.8 C) 98.2 F (36.8 C)  Resp: 18 20 20 20   Height:      Weight:      SpO2: 99% 96% 98% 98%  TempSrc:  Oral Oral  Oral  BMI (Calculated):         Discharge exam  GENERAL: No apparent distress.  Nontoxic. HEENT: MMM.  Vision and hearing grossly intact.  NECK: Supple.  No apparent JVD.  RESP:  No IWOB.  Fair aeration bilaterally. CVS:  RRR. Heart sounds normal.  ABD/GI/GU: BS+. Abd soft, NTND.  MSK/EXT: Right heel ulceration.  No apparent drainage.  See picture under media.  2+ DP pulse in right foot.  Not able to palpate PT pulse.  Left BKA SKIN: Left thigh ulcer with no purulent drainage or surrounding skin erythema.  See picture below NEURO: AA.  Oriented appropriately.  No apparent focal neuro deficit. PSYCH: Calm. Normal affect.   Discharge Instructions Discharge Instructions     Discharge instructions   Complete by: As directed    It has been a pleasure taking care of you!  You were hospitalized due to right heel ulcer and infection for which you have been treated with  IV antibiotics.  We are discharging you on oral antibiotics to complete treatment course.  Follow-up with your podiatrist per the recommendation.  This very important that he have your diabetic under good control for the wound to heal.  We strongly recommend you follow-up with your endocrinologist as soon as possible.  Also we recommend follow-up with your primary care doctor soon as possible.  See a separate instruction about the diabetes and diet   Take care,   Discharge wound care:   Complete by: As directed    11/14/23 1442    Wound care  daily Comments: L hip: cleanse with Vashe (lawson #[phone]), pat dry.  Apply 1/4 thick layer of leptospermum honey to wound bed top with dry gauze and cover with silicone foam, change daily. Ok to lift silicone foam to reapply Medihoney daily.    Wound care  Daily    R heel: paint with betadine then apply silver hydrofiber (lawson [phone]), 4x4 gauze kerlix roll secure with tape Elevate R heel in Prevalon boots (lawson # [phone]) at all times when in bed to off load pressure.    Increase activity slowly   Complete by: As directed       Allergies as of 11/15/2023       Reactions   Aspirin  Anaphylaxis, Other (See Comments)   Throat closing    Bee Venom Anaphylaxis   Pineapple Anaphylaxis   Definity  [perflutren  Lipid Microsphere]    Muscle Aches   E-mycin [erythromycin Base] Nausea And Vomiting        Medication List     TAKE these medications    Accu-Chek Guide Test test strip Generic drug: glucose blood Use as instructed to monitor glucose 4 times daily- use as back up for Dexcom   Accu-Chek Softclix Lancets lancets Use as instructed to monitor glucose 4 times daily- use as backup to Dexcom   acetaminophen  500 MG tablet Commonly known as: TYLENOL  Take 1 tablet (500 mg total) by mouth every 6 (six) hours as needed for moderate pain.   albuterol  108 (90 Base) MCG/ACT inhaler Commonly known as: VENTOLIN  HFA Inhale 2 puffs into the lungs every 6 (six) hours as needed for wheezing. Shortness of breath   amoxicillin -clavulanate 875-125 MG tablet Commonly known as: AUGMENTIN  Take 1 tablet by mouth 2 (two) times daily for 7 days.   B-D ULTRAFINE III SHORT PEN 31G X 8 MM Misc Generic drug: Insulin  Pen Needle USE AS DIRECTED   blood glucose meter kit and supplies Dispense based on patient and insurance preference. Use four times daily as directed. (FOR ICD-10 E10.9, E11.9).   busPIRone  5 MG tablet Commonly known as: BUSPAR  Take 5 mg by mouth 2 (two) times daily.   Cholecalciferol  1.25 MG (50000 UT) capsule Take 50,000 Units by mouth once a week.   clopidogrel  75 MG tablet Commonly known as: PLAVIX  Take 1 tablet by mouth once daily   doxycycline  100 MG tablet Commonly known as: VIBRA -TABS Take 1 tablet (100 mg total) by mouth 2 (two) times daily for 7 days. What changed: additional instructions   Easy Touch Insulin  Syringe 31G X 5/16 1 ML Misc Generic drug: Insulin  Syringe-Needle U-100   EPINEPHrine  0.3 mg/0.3 mL Soaj  injection Commonly known as: EPI-PEN Inject 0.3 mg into the muscle as needed for anaphylaxis.   fluconazole  150 MG tablet Commonly known as: DIFLUCAN  Take 150 mg by mouth once.   gabapentin  300 MG capsule Commonly known as: Neurontin  Take 1 capsule (300 mg total) by  mouth at bedtime.   HumuLIN  R U-500 KwikPen 500 UNIT/ML KwikPen Generic drug: insulin  regular human CONCENTRATED Inject 90 Units into the skin 3 (three) times daily with meals. What changed:  how much to take additional instructions   Klor-Con  M20 20 MEQ tablet Generic drug: potassium chloride  SA TAKE 1 TABLET BY MOUTH ONCE DAILY. PATIENT IS OVERDUE FOR A FOLLOW-UP APPOINTMENT. PLEASE SCHEDULE AN APPOINTMENT FOR ADDITIONAL REFILLS.   levocetirizine 5 MG tablet Commonly known as: XYZAL  Take 5 mg by mouth daily.   metoprolol  tartrate 25 MG tablet Commonly known as: LOPRESSOR  Take 25 mg by mouth 2 (two) times daily. as directed   nitroGLYCERIN  0.4 MG SL tablet Commonly known as: NITROSTAT  DISSOLVE ONE TABLET UNDER THE TONGUE EVERY 5 MINUTES AS NEEDED FOR CHEST PAIN.  DO NOT EXCEED A TOTAL OF 3 DOSES IN 15 MINUTES   nystatin  powder Commonly known as: nystatin  Apply 1 Application topically 3 (three) times daily.   pantoprazole  40 MG tablet Commonly known as: PROTONIX  Take 1 tablet (40 mg total) by mouth daily. What changed: when to take this   rosuvastatin  40 MG tablet Commonly known as: CRESTOR  Take 40 mg by mouth at bedtime.   Semaglutide  (2 MG/DOSE) 8 MG/3ML Sopn Inject 2 mg as directed once a week.   senna-docusate 8.6-50 MG tablet Commonly known as: Senokot-S Take 1-2 tablets by mouth 2 (two) times daily between meals as needed for mild constipation or moderate constipation.   torsemide  20 MG tablet Commonly known as: DEMADEX  Take 2 tablets by mouth once daily   traMADol  50 MG tablet Commonly known as: ULTRAM  Take 50 mg by mouth every 8 (eight) hours.   Vraylar  3 MG capsule Generic drug:  cariprazine  Take 3 mg by mouth daily.               Durable Medical Equipment  (From admission, onward)           Start     Ordered   11/15/23 0954  For home use only DME standard manual wheelchair with seat cushion  Once       Comments: Patient suffers from left BKA and Right heel ulcer which impairs their ability to perform daily activities like bathing, dressing, feeding, grooming, and toileting in the home.  A cane, crutch, or walker will not resolve issue with performing activities of daily living. A wheelchair will allow patient to safely perform daily activities. Patient can safely propel the wheelchair in the home or has a caregiver who can provide assistance. Length of need Lifetime. Accessories: elevating leg rests (ELRs), wheel locks, extensions and anti-tippers.   11/15/23 0953              Discharge Care Instructions  (From admission, onward)           Start     Ordered   11/15/23 0000  Discharge wound care:       Comments: 11/14/23 1442    Wound care  daily Comments: L hip: cleanse with Vashe (lawson #[phone]), pat dry.  Apply 1/4 thick layer of leptospermum honey to wound bed top with dry gauze and cover with silicone foam, change daily. Ok to lift silicone foam to reapply Medihoney daily.    Wound care  Daily    R heel: paint with betadine then apply silver hydrofiber (lawson [phone]), 4x4 gauze kerlix roll secure with tape Elevate R heel in Prevalon boots (lawson # [phone]) at all times when in bed to off load pressure.  11/15/23 1027             Procedures/Studies:   PERIPHERAL VASCULAR CATHETERIZATION Result Date: 11/14/2023 Patient name: Nicole [name] MRN: [id number] DOB: Dec 27, 1965 Sex: female 11/14/2023 Pre-operative Diagnosis: Right heel ulceration Post-operative diagnosis:  Same Surgeon:  Penne C. Sheree, MD Procedure Performed: 1.  Percutaneous ultrasound-guided cannulation and Mynx device closure left common femoral artery 2.   Catheter selection of aorta and aortogram with bilateral lower extremity angiography 3.  Catheter selection right common femoral artery 4.  Moderate sedation fentanyl  and Versed  18 minutes Indications: 58 year old female with history of left below-knee amputation now with right heel ulceration.  She is not ambulatory but does use the right leg for mobilization.  She is indicated for angiography with possible intervention as she has mildly depressed ABI. Findings: The aorta is patent with 2 renal arteries on the right and 1 on the left which are also patent.  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  The iliac segments are all patent.  Bilateral common femoral arteries branching to profundus and superficial femoral arteries which are patent.  The right side is the site of interest there is brisk flow through 3 vessels and the posterior tibial artery is noted to be filling the heel.  No intervention was undertaken.  Procedure:  The patient was identified in the holding area and taken to room 8.  The patient was then placed supine on the table and prepped and draped in the usual sterile fashion.  A time out was called.  Ultrasound was used to evaluate the left common femoral artery.  This was patent.  The area was anesthetized with 1% lidocaine  and cannulated with a micropuncture needle followed by wire and sheath using direct ultrasound visualization.  An ultrasound images saved to the permanent record.  Concomitantly we administered fentanyl  and Versed  with moderate sedation her vital signs were monitored throughout the case.  We placed a Bentson wire followed by 5 [place] sheath and Omni catheter to the level of L1 and aortogram was performed we then pulled the catheter to the bifurcation performed pelvic angiography with limited bilateral lower extremity angiography.  We then crossed the bifurcation with Bentson and Omni catheter perform right lower extremity angiography.   With the above findings no intervention was undertaken.  The catheter and wire were removed and a Mynx device was deployed.  She tolerated the procedure without any complication. Contrast: 50cc Brandon C. Sheree, MD [facility] Office: [phone] Pager: [phone]   VAS US  ABI WITH/WO TBI Result Date: 11/11/2023  LOWER EXTREMITY DOPPLER STUDY Patient Name:  Nicole [name]  Date of Exam:   11/11/2023 Medical Rec #: [id number]        Accession #:    [id number] Date of Birth: 05/11/1965         Patient Gender: F Patient Age:   86 years Exam Location:  [facility] Procedure:      VAS US  ABI WITH/WO TBI Referring Phys: MARSA HONOUR --------------------------------------------------------------------------------  Indications: Ulceration. Left BKA 01/2020 High Risk Factors: Hypertension, hyperlipidemia, Diabetes (uncontrolled), past                    history of smoking. Other Factors: COPD, TIA, CKD, OSA on CPAP.  Limitations: Today's exam was limited due to Tremors. Comparison Study: Prior ABI done 09/09/22 Performing Technologist: Rachel Pellet RVS  Examination Guidelines: A complete evaluation includes at minimum,  Doppler waveform signals and systolic blood pressure reading at the level of bilateral brachial, anterior tibial, and posterior tibial arteries, when vessel segments are accessible. Bilateral testing is considered an integral part of a complete examination. Photoelectric Plethysmograph (PPG) waveforms and toe systolic pressure readings are included as required and additional duplex testing as needed. Limited examinations for reoccurring indications may be performed as noted.  ABI Findings: +---------+------------------+-----+---------+--------+ Right    Rt Pressure (mmHg)IndexWaveform Comment  +---------+------------------+-----+---------+--------+ Brachial 140                    triphasic          +---------+------------------+-----+---------+--------+ PTA      120               0.86 biphasic          +---------+------------------+-----+---------+--------+ DP       113               0.81 biphasic          +---------+------------------+-----+---------+--------+ Great Toe82                0.59                   +---------+------------------+-----+---------+--------+ +---------+------------------+-----+---------+-------+ Left     Lt Pressure (mmHg)IndexWaveform Comment +---------+------------------+-----+---------+-------+ Brachial 139                    triphasic        +---------+------------------+-----+---------+-------+ PTA                                      BKA     +---------+------------------+-----+---------+-------+ DP                                       BKA     +---------+------------------+-----+---------+-------+ Great Toe                                BKA     +---------+------------------+-----+---------+-------+ +-------+-----------+-----------+------------+--------------+ ABI/TBIToday's ABIToday's TBIPrevious ABIPrevious TBI   +-------+-----------+-----------+------------+--------------+ Right  0.86       0.59       1.01        wound/bandages +-------+-----------+-----------+------------+--------------+ Left   BKA        BKA        BKA         BKA            +-------+-----------+-----------+------------+--------------+ Right ABIs appear decreased compared to prior study on 09/09/22.  Summary: Right: Resting right ankle-brachial index indicates mild right lower extremity arterial disease. The right toe-brachial index is abnormal.  Left:  BKA. *See table(s) above for measurements and observations.  Electronically signed by Penne Colorado MD on 11/11/2023 at 7:48:46 PM.    Final    MR FOOT RIGHT W WO CONTRAST Result Date: 11/10/2023 CLINICAL DATA:  Right heel wound. History of diabetes. Concern for infection. EXAM: MRI OF THE RIGHT  FOREFOOT WITHOUT AND WITH CONTRAST TECHNIQUE: Multiplanar, multisequence MR imaging of the right foot was performed before and after the administration of intravenous contrast. CONTRAST:  10mL GADAVIST  GADOBUTROL  1 MMOL/ML IV SOLN COMPARISON:  Right foot radiographs dated 11/10/2023. FINDINGS: Bones/Joint/Cartilage No marrow signal abnormality identified to suggest osteomyelitis. No fracture or  dislocation. Normal alignment. No joint effusion. Mild-to-moderate degenerative arthropathy throughout the hindfoot and midfoot. Calcaneal enthesopathy at the insertion of the Achilles tendon and the origin of the central cord of the plantar fascia. Ligaments Medial and lateral ankle ligaments appear intact. Muscles and Tendons Flexor, peroneal and extensor compartment tendons are intact. Muscles are within normal limits. Thickening of the central cord of the plantar fascia, compatible with plantar fasciitis. Soft tissue Soft tissue ulceration/wound at the medial heel with surrounding soft tissue edema, enhancement and cutaneous thickening, most pronounced along the anteromedial margin, compatible with cellulitis. No loculated fluid collection. Subcutaneous edema of the visualized distal calf. IMPRESSION: 1. Soft tissue ulceration/wound at the medial heel with surrounding soft tissue edema, enhancement and cutaneous thickening, most pronounced along the anteromedial margin. These findings are compatible with cellulitis. No loculated fluid collection. 2. No evidence of osteomyelitis. 3. Thickening of the central cord of the plantar fascia is compatible with plantar fasciitis. 4. Mild-to-moderate degenerative arthropathy throughout the hindfoot and midfoot. Electronically Signed   By: Harrietta Sherry M.D.   On: 11/10/2023 10:58   DG Foot Complete Right Result Date: 11/10/2023 CLINICAL DATA:  Soft tissue wound over the heel, evaluate for underlying osteomyelitis EXAM: RIGHT FOOT COMPLETE - 3+ VIEW COMPARISON:  07/01/2022  FINDINGS: No acute fracture or dislocation is noted. No soft tissue abnormality is seen. Calcaneal spurring is noted. The skin sloughing seen on physical exam is noted over the heel. No underlying erosive changes are seen to suggest osteomyelitis. IMPRESSION: No findings to suggest osteomyelitis. Electronically Signed   By: Oneil Devonshire M.D.   On: 11/10/2023 01:38       The results of significant diagnostics from this hospitalization (including imaging, microbiology, ancillary and laboratory) are listed below for reference.     Microbiology: Recent Results (from the past 240 hours)  Blood culture (routine x 2)     Status: None   Collection Time: 11/10/23  1:37 AM   Specimen: BLOOD  Result Value Ref Range Status   Specimen Description BLOOD BLOOD RIGHT ARM  Final   Special Requests   Final    BOTTLES DRAWN AEROBIC AND ANAEROBIC Blood Culture adequate volume   Culture   Final    NO GROWTH 5 DAYS Performed at [facility], [address] [place], [place] [address]    Report Status 11/15/2023 FINAL  Final  Blood culture (routine x 2)     Status: None   Collection Time: 11/10/23  1:39 AM   Specimen: BLOOD  Result Value Ref Range Status   Specimen Description BLOOD RIGHT ANTECUBITAL  Final   Special Requests   Final    BOTTLES DRAWN AEROBIC AND ANAEROBIC Blood Culture adequate volume   Culture   Final    NO GROWTH 5 DAYS Performed at [facility], [address] [place], [place] [address]    Report Status 11/15/2023 FINAL  Final     Labs:  CBC: Recent Labs  Lab 11/10/23 0137 11/11/23 0332 11/12/23 0731 11/14/23 1958  WBC 7.6 8.8 7.1 7.1  NEUTROABS 3.8  --   --   --   HGB 11.3* 11.1* 11.2* 11.0*  HCT 35.5* 33.4* 33.9* 33.9*  MCV 94.4 92.0 91.4 93.1  PLT 158 154 156 150   BMP &GFR Recent Labs  Lab 11/10/23 0137 11/11/23 0332 11/12/23 0731 11/14/23 0356  NA 138 137 136 137  K 4.2 4.1 3.9 4.3  CL 100 97* 99 101  CO2 28 26 25  26  GLUCOSE 389* 201* 279* 205*  BUN  18 17 20 16   CREATININE 0.78 0.90 1.00 0.91  CALCIUM  9.1 9.1 9.2 9.3  MG  --   --  1.6* 1.9  PHOS  --   --  4.2  --    Estimated Creatinine Clearance: 82.5 mL/min (by C-G formula based on SCr of 0.91 mg/dL). Liver & Pancreas: Recent Labs  Lab 11/10/23 0137 11/12/23 0731  AST 19  --   ALT 12  --   ALKPHOS 77  --   BILITOT 0.5  --   PROT 6.7  --   ALBUMIN 3.2* 2.9*   No results for input(s): LIPASE, AMYLASE in the last 168 hours. No results for input(s): AMMONIA in the last 168 hours. Diabetic: No results for input(s): HGBA1C in the last 72 hours. Recent Labs  Lab 11/14/23 0838 11/14/23 1125 11/14/23 1815 11/15/23 0629 11/15/23 1152  GLUCAP 236* 174* 257* 194* 384*   Cardiac Enzymes: No results for input(s): CKTOTAL, CKMB, CKMBINDEX, TROPONINI in the last 168 hours. No results for input(s): PROBNP in the last 8760 hours. Coagulation Profile: No results for input(s): INR, PROTIME in the last 168 hours. Thyroid  Function Tests: No results for input(s): TSH, T4TOTAL, FREET4, T3FREE, THYROIDAB in the last 72 hours. Lipid Profile: No results for input(s): CHOL, HDL, LDLCALC, TRIG, CHOLHDL, LDLDIRECT in the last 72 hours. Anemia Panel: No results for input(s): VITAMINB12, FOLATE, FERRITIN, TIBC, IRON, RETICCTPCT in the last 72 hours. Urine analysis:    Component Value Date/Time   COLORURINE STRAW (A) 06/16/2021 1656   APPEARANCEUR CLEAR 06/16/2021 1656   LABSPEC 1.020 06/16/2021 1656   PHURINE 6.0 06/16/2021 1656   GLUCOSEU >=500 (A) 06/16/2021 1656   HGBUR NEGATIVE 06/16/2021 1656   BILIRUBINUR NEGATIVE 06/16/2021 1656   KETONESUR NEGATIVE 06/16/2021 1656   PROTEINUR 100 (A) 06/16/2021 1656   UROBILINOGEN 1.0 02/18/2011 2336   NITRITE NEGATIVE 06/16/2021 1656   LEUKOCYTESUR SMALL (A) 06/16/2021 1656   Sepsis Labs: Invalid input(s): PROCALCITONIN, LACTICIDVEN   SIGNED:  Tajai Ihde T Mckinlee Dunk, MD  [facility] 11/15/2023, 4:50 PM

## 2023-11-15 NOTE — Progress Notes (Signed)
 Discharge Delay: Pt is Left BKA with Right foam boot. Working with CM with DME and Transportation home.   RN updated to review med list and update at needed.   Med list and AVS completed and placed in chart.

## 2023-11-15 NOTE — TOC Transition Note (Addendum)
 Transition of Care (TOC) - Discharge Note Rayfield Gobble RN, BSN Inpatient Care Management Unit 4E- RN Case Manager See Treatment Team for direct phone #   Patient Details  Name: Nicole [name] MRN: [id number] Date of Birth: [birth year]  Transition of Care [facility]) CM/SW Contact:  Gobble Rayfield Hurst, RN Phone Number: 11/15/2023, 11:27 AM   Clinical Narrative:    Pt stable for transition home, noted that pt has refused R-LE amputation at this time.   Orders placed for [facility] and DME.   CM spoke with pt at bedside- per pt she is active with [facility] for [facility] services and would like to continue with them. CM notified [facility] liaison for resumption of care for RN/PT/OT needs.   Also discussed with pt DME needs, per pt her wheelchair is old (got in 2001) and needs new one, she also has transport chair that she bought on her own.  Voiced she does not have a preference on DME provider.   Daughter to provide transportation home after 4pm this afternoon.   Call made to [facility] liaison for DME need- w/c- pt would like w/c delivered prior to d/c.  [facility] to process and deliver to pt pending insurance verification.  1152- update- per [facility] pt received wheelchair 05/25/2021 and insurance will not cover another one at this time- they are asking if pt would like left amputee pad?  CM spoke with pt at bedside to let her know- pt voiced she does not remember getting w/c in 2023, explained that due to it being in the system insurance will not cover regardless- pt voiced she does not need Left amputee pad, stated she needs right leg extender. Had [facility] check into ELR- however system shows she also got them in 2023 as well as cushions, they advice pt look at medical retail stores for another ELR or [employer].   No further Inpt CM needs noted.   1330- spoke with pt- family now unable to pick her up and will be away from the home between 3-4pm - CM will arrange for [facility] transport for pt to arrive home after 4pm when  family is back to let her in.   [facility] called for transport- scheduled time for pickup is 1530 this afternoon- paperwork placed on chart.    Final next level of care: Home w Home Health Services Barriers to Discharge: No Barriers Identified   Patient Goals and CMS Choice Patient states their goals for this hospitalization and ongoing recovery are:: return home   Choice offered to / list presented to : Patient      Discharge Placement               Home w/ [facility]        Discharge Plan and Services Additional resources added to the After Visit Summary for     Discharge Planning Services: CM Consult Post Acute Care Choice: Durable Medical Equipment, Home Health, Resumption of Svcs/PTA Provider          DME Arranged: Wheelchair manual DME Agency: [facility] Date DME Agency Contacted: 11/15/23 Time DME Agency Contacted: 1127 Representative spoke with at DME Agency: Darlyn HH Arranged: RN, PT, OT [facility] Agency: [facility] Date HH Agency Contacted: 11/15/23 Time HH Agency Contacted: 1127 Representative spoke with at [facility] Agency: Gil  Social Drivers of Health (SDOH) Interventions SDOH Screenings   Food Insecurity: No Food Insecurity (11/10/2023)  Housing: Low Risk  (11/10/2023)  Transportation Needs: No Transportation Needs (11/10/2023)  Utilities: Not At  Risk (11/10/2023)  Depression (PHQ2-9): High Risk (12/23/2020)  Tobacco Use: Medium Risk (11/09/2023)     Readmission Risk Interventions    11/15/2023   11:27 AM  Readmission Risk Prevention Plan  Transportation Screening Complete  PCP or Specialist Appt within 5-7 Days Complete  Home Care Screening Complete  Medication Review (RN CM) Complete

## 2023-11-15 NOTE — Progress Notes (Signed)
 Patient was not able to receive her medication upon discharge, I contacted daughter and she will pick up home meds from [facility] in [place], our Pharmacist confirmed and will transfer Rx.

## 2023-11-15 NOTE — TOC CM/SW Note (Signed)
   Durable Medical Equipment (From admission, onward)        Start     Ordered  11/15/23 0954  For home use only DME standard manual wheelchair with seat cushion  Once      Comments: Patient suffers from left BKA and Right heel ulcer which impairs their ability to perform daily activities like bathing, dressing, feeding, grooming, and toileting in the home.  A cane, crutch, or walker will not resolve issue with performing activities of daily living. A wheelchair will allow patient to safely perform daily activities. Patient can safely propel the wheelchair in the home or has a caregiver who can provide assistance. Length of need Lifetime. Accessories: elevating leg rests (ELRs), wheel locks, extensions and anti-tippers.  11/15/23 0953  Pressure relieving cushion

## 2023-11-15 NOTE — Progress Notes (Signed)
 Education provider to patient regarding discharge instructions, appointments, medications and home care services, patient agreeable she did have questions and concerns regarding her wound care. Peripheral IV removed, AVS documents placed in discharge package, patient transported by [facility].

## 2023-11-15 NOTE — Evaluation (Signed)
 Physical Therapy Evaluation Patient Details Name: Nicole [name] MRN: [id number] DOB: Dec 19, 1965 Today's Date: 11/15/2023  History of Present Illness  Patient is a 58 yo woman that presents to the hospital for a R worsening R heel wound and c/o cellultis, Had angiogram left CFA 9/22. MD said may need right AKA or BKA in future but will monitor. PMHx: HTN, HLD, GERD CAD, DM, tremor, L BKA  Clinical Impression  Pt admitted with above diagnosis. Pt was able to stand to RW with supervision. Pt uses prosthesis at home and functions well with that and RW per pt overall.  MD wants her to stay off of right LE for healing purposes and limit ambulation therefore recommend wheelchair for home to decr time that pt is up on right foot.  Also will help pt in community as well. Will follow acutely.   Pt currently with functional limitations due to the deficits listed below (see PT Problem List). Pt will benefit from acute skilled PT to increase their independence and safety with mobility to allow discharge.           If plan is discharge home, recommend the following: Assistance with cooking/housework;Assist for transportation;Help with stairs or ramp for entrance   Can travel by private vehicle        Equipment Recommendations Amputee Wheelchair [phone]);Wheelchair cushion [phone])- pressure relieving cushion. Anti-tippers  Recommendations for Other Services       Functional Status Assessment Patient has had a recent decline in their functional status and demonstrates the ability to make significant improvements in function in a reasonable and predictable amount of time.     Precautions / Restrictions Precautions Precautions: Fall Required Braces or Orthoses: Other Brace Other Brace: post op shoe right foot, prosthesis for left BKA (pt left it at home) Restrictions Weight Bearing Restrictions Per Provider Order: Yes RLE Weight Bearing Per Provider Order: Weight bearing as tolerated Other  Position/Activity Restrictions: in post op shoe right foot for transfers and short distance ambulation      Mobility  Bed Mobility               General bed mobility comments: Pt in chair on arrival    Transfers Overall transfer level: Needs assistance Equipment used: Rolling walker (2 wheels) Transfers: Sit to/from Stand Sit to Stand: Supervision           General transfer comment: No assist to stand with post op shoe in place. Did give cues for hand placement. Pt prefers not to ambulate wihtout prosthesis therefore performed 3 sit to stands only.    Ambulation/Gait                  Stairs            Wheelchair Mobility     Tilt Bed    Modified Rankin (Stroke Patients Only)       Balance Overall balance assessment: Needs assistance Sitting-balance support: No upper extremity supported, Feet supported Sitting balance-Leahy Scale: Fair     Standing balance support: Bilateral upper extremity supported, During functional activity, Reliant on assistive device for balance Standing balance-Leahy Scale: Poor Standing balance comment: reelies on UE support on RW                             Pertinent Vitals/Pain Pain Assessment Pain Assessment: Faces Pain Score: 6  Pain Location: R foot Pain Descriptors / Indicators: Aching Pain Intervention(s): Limited activity within  patient's tolerance, Monitored during session, Repositioned    Home Living Family/patient expects to be discharged to:: Private residence Living Arrangements: Children Available Help at Discharge: Family Type of Home: House Home Access: Ramped entrance       Home Layout: One level Home Equipment: Rollator (4 wheels);Rolling Walker (2 wheels);BSC/3in1      Prior Function Prior Level of Function : Needs assist             Mobility Comments: Pt used prosthesis left BKA and was able to use walker and walk short distances at home. ADLs Comments: I B/D      Extremity/Trunk Assessment   Upper Extremity Assessment Upper Extremity Assessment: Defer to OT evaluation    Lower Extremity Assessment Lower Extremity Assessment: Overall WFL for tasks assessed    Cervical / Trunk Assessment Cervical / Trunk Assessment: Normal  Communication   Communication Communication: No apparent difficulties    Cognition Arousal: Alert Behavior During Therapy: WFL for tasks assessed/performed   PT - Cognitive impairments: No apparent impairments                         Following commands: Intact       Cueing       General Comments General comments (skin integrity, edema, etc.): VSS    Exercises     Assessment/Plan    PT Assessment Patient needs continued PT services  PT Problem List Decreased strength;Decreased activity tolerance;Decreased mobility;Decreased knowledge of use of DME;Pain       PT Treatment Interventions DME instruction;Gait training;Functional mobility training;Therapeutic activities;Therapeutic exercise;Balance training;Patient/family education;Wheelchair mobility training    PT Goals (Current goals can be found in the Care Plan section)  Acute Rehab PT Goals Patient Stated Goal: back home after the revascularization,  Save the leg PT Goal Formulation: With patient Time For Goal Achievement: 11/29/23 Potential to Achieve Goals: Good    Frequency Min 2X/week     Co-evaluation               AM-PAC PT 6 Clicks Mobility  Outcome Measure Help needed turning from your back to your side while in a flat bed without using bedrails?: A Little Help needed moving from lying on your back to sitting on the side of a flat bed without using bedrails?: A Little Help needed moving to and from a bed to a chair (including a wheelchair)?: A Little Help needed standing up from a chair using your arms (e.g., wheelchair or bedside chair)?: A Little Help needed to walk in hospital room?: A Little Help needed  climbing 3-5 steps with a railing? : Total 6 Click Score: 16    End of Session Equipment Utilized During Treatment: Gait belt Activity Tolerance: Patient tolerated treatment well Patient left: in chair;with call bell/phone within reach;with chair alarm set Nurse Communication: Mobility status PT Visit Diagnosis: Other abnormalities of gait and mobility (R26.89);Pain Pain - Right/Left: Right Pain - part of body: Ankle and joints of foot    Time: 9068-9051 PT Time Calculation (min) (ACUTE ONLY): 17 min   Charges:   PT Evaluation $PT Re-evaluation: 1 Re-eval   PT General Charges $$ ACUTE PT VISIT: 1 Visit         Levin Dagostino M,PT Acute Rehab Services [phone]   Stephane JULIANNA Bevel 11/15/2023, 11:13 AM

## 2023-11-15 NOTE — Progress Notes (Addendum)
  Progress Note    11/15/2023 6:45 AM 1 Day Post-Op  Subjective:  no complaints  afebrile  Vitals:   11/14/23 2338 11/15/23 0419  BP: (!) 139/58 (!) 146/90  Pulse: 69 75  Resp: 20 20  Temp: 98.4 F (36.9 C) 98.2 F (36.8 C)  SpO2: 96% 98%    Physical Exam: General:  no distress Lungs:  non labored Incisions:  left groin is soft without hematoma   CBC    Component Value Date/Time   WBC 7.1 11/14/2023 1958   RBC 3.64 (L) 11/14/2023 1958   HGB 11.0 (L) 11/14/2023 1958   HCT 33.9 (L) 11/14/2023 1958   PLT 150 11/14/2023 1958   MCV 93.1 11/14/2023 1958   MCH 30.2 11/14/2023 1958   MCHC 32.4 11/14/2023 1958   RDW 13.1 11/14/2023 1958   LYMPHSABS 3.0 11/10/2023 0137   MONOABS 0.7 11/10/2023 0137   EOSABS 0.1 11/10/2023 0137   BASOSABS 0.1 11/10/2023 0137    BMET    Component Value Date/Time   NA 137 11/14/2023 0356   K 4.3 11/14/2023 0356   CL 101 11/14/2023 0356   CO2 26 11/14/2023 0356   GLUCOSE 205 (H) 11/14/2023 0356   BUN 16 11/14/2023 0356   CREATININE 0.91 11/14/2023 0356   CREATININE 0.86 07/30/2020 1558   CALCIUM  9.3 11/14/2023 0356   GFRNONAA >60 11/14/2023 0356   GFRNONAA 76 07/30/2020 1558   GFRAA 88 07/30/2020 1558    INR    Component Value Date/Time   INR 1.4 (H) 01/23/2020 0421     Intake/Output Summary (Last 24 hours) at 11/15/2023 0645 Last data filed at 11/14/2023 1900 Gross per 24 hour  Intake 580 ml  Output 1000 ml  Net -420 ml   Findings: The aorta is patent with 2 renal arteries on the right and 1 on the left which are also patent.  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  The iliac segments are all patent.  Bilateral common femoral arteries branching to profundus and superficial femoral arteries which are patent.  The right side is the site of interest there is brisk flow through 3 vessels and the posterior tibial artery is noted to be filling the heel.  No intervention was  undertaken    Assessment/Plan:  58 y.o. female is s/p:  Angiogram via left CFA on 11/14/2023 by Dr. Sheree  1 Day Post-Op   -pt left groin soft without hematoma.   -per Dr. Sheree, pt will need either AKA or BKA and he will be in to see her this morning.     Nicole Apt, PA-C [facility] [phone] 11/15/2023 6:45 AM   I have independently interviewed and examined patient and agree with PA assessment and plan above.  She is likely to require amputation in the future but there is no pressing need.  She is okay for discharge if she is not wanting to proceed with amputation during this hospitalization.  Shenandoah Yeats C. Sheree, MD [facility] [place] Office: [phone] Pager: [phone]

## 2023-11-16 ENCOUNTER — Telehealth: Payer: Self-pay | Admitting: Nurse Practitioner

## 2023-11-16 ENCOUNTER — Telehealth: Payer: Self-pay

## 2023-11-16 NOTE — Telephone Encounter (Signed)
 Pts daughter has called saying hospital discharge is requesting her to be seen here soon, do you want me to overbook within reason?

## 2023-11-16 NOTE — Telephone Encounter (Signed)
 Yes, we can overbook within reason- she can always follow up with Dr. Shona (her PCP) in the interim too.  I know she missed several past appts with me.

## 2023-11-16 NOTE — Transitions of Care (Post Inpatient/ED Visit) (Signed)
   11/16/2023  Name: LOREAL SCHUESSLER MRN: [id number] DOB: Jun 30, 1965  Today's TOC FU Call Status: Today's TOC FU Call Status:: Unsuccessful Call (1st Attempt) Unsuccessful Call (1st Attempt) Date: 11/16/23  Attempted to reach the patient regarding the most recent Inpatient/ED visit.  Follow Up Plan: Additional outreach attempts will be made to reach the patient to complete the Transitions of Care (Post Inpatient/ED visit) call.   Srinivas Lippman J. Tyrek Lawhorn RN, MSN [facility], [facility] RN Care Manager Direct Dial: [phone]  Fax: [phone] Website: [email or web address]

## 2023-11-16 NOTE — Progress Notes (Unsigned)
 Cardiology Office Note    Date:  11/17/2023  ID:  [name], [name] 08-15-1965, MRN [id number] Cardiologist: Vina Gull, MD Cardiology APP:  Johnson Laymon [name], PA-C { : History of Present Illness:    Nicole [name] is a 58 y.o. female with past medical history of  CAD (s/p stenting in 2009, low-risk NST in 2013, reported 2 more stents in [place]  in 2020 and history of false negative stress tests, cath in 07/2021 showing patent stents along the RCA and mid LAD with only mild nonobstructive disease), paroxysmal atrial fibrillation (isolated episode in 09/2019 and not started on chronic anticoagulation), HTN, HLD, PAD (s/p L BKA), COPD and Type 2 DM who presents to the office today for hospital follow-up and overdue follow-up in general.  She was last examined by Dr. Gull in 03/2022 and did report having dyspnea on exertion and was on supplemental oxygen . Denied any recent chest pain or palpitations. She did have an allergy to ASA and was continued on Plavix  75 mg daily. She was also continued on Farxiga  10 mg daily, Crestor  40 mg daily and Torsemide  40 mg daily.  She was most recently admitted to [facility] from 9/17 - 11/15/2023 for a diabetic right foot ulcer. MRI was negative for abscess or osteomyelitis but concerning for cellulitis. She was treated with IV antibiotic therapy. She underwent lower extremity angiography by Dr. Sheree on 11/14/2023 which showed patent iliac segments and patent bilateral common femoral arteries. No intervention was performed. It was felt that she would likely require amputation in the future but she did not wish to proceed during admission. Of note, her diabetes was significantly uncontrolled with hemoglobin A1c at 13.2. Was encouraged to follow-up with Endocrinology as soon as possible.  In talking with the patient and her 2 daughters today, she reports feeling weak since her recent hospitalization. She has mostly been using a wheelchair for ambulation around the  home but does use her walker on occasion. She reports having intermittent episodes of chest pain which have been occurring for several months up to a few years. Reports episodes occur at night or sometimes with exertion. She has not had to take sublingual nitroglycerin . Has baseline dyspnea on exertion but no specific orthopnea, PND or pitting edema. She is awaiting a new CPAP which should arrive this week. Home health is coming out to clean and reassess her diabetic foot ulcer and she does have follow-up with podiatry and the wound clinic within the next few weeks. One of her daughters is now preparing most meals and trying to follow a diabetic diet.  Studies Reviewed:   EKG: EKG is not ordered today.   Cardiac Catheterization: 07/2021   1st Diag lesion is 20% stenosed.   Prox LAD lesion is 20% stenosed.   Mid LAD-1 lesion is 20% stenosed.   Dist RCA lesion is 30% stenosed.   Prox RCA-2 lesion is 30% stenosed.   Previously placed Prox RCA-1 stent of unknown type is  widely patent.   Previously placed Mid LAD-2 stent of unknown type is  widely patent.   The left ventricular systolic function is normal.   LV end diastolic pressure is mildly elevated.   The left ventricular ejection fraction is 55-65% by visual estimate.   Mild nonobstructive CAD with 20% stenosis in a very proximal diagonal (optional diagonal) vessel with 20% segmental proximal LAD stenosis with widely patent previously placed mid LAD stent.  The left circumflex coronary artery is angiographically normal.  The  previously placed stent in the right coronary artery is widely patent and there is mild 30% mid and distal both stenoses.   Normal LV function with EF at 55%.  LVEDP 23 mmHg.   Minx closure device for right groin hemostasis.   RECOMMENDATION: Continue her prior Plavix  Rx with ASA allergy.  Patient was hypertensive during the procedure and received 20 mg of hydralazine  postprocedure.  Will need optimization of blood  pressure control with target blood pressure less than 130/80.  Aggressive continued lipid-lowering therapy with target LDL less than 70.  Patient has poorly controlled diabetes mellitus which needs to be more optimally treated.  Due to the late hour of the procedure and the patient's glucose greater than 400 today, we will keep patient overnight for observation with plans for optimization of therapy and discharge tomorrow.  Echocardiogram: 11/2021 IMPRESSIONS     1. Left ventricular ejection fraction, by estimation, is 65 to 70%. The  left ventricle has hyperdynamic function. The left ventricle has no  regional wall motion abnormalities. Left ventricular diastolic parameters  are indeterminate.   2. Right ventricular systolic function was not well visualized. The right  ventricular size is not well visualized.   3. The mitral valve was not well visualized. No evidence of mitral valve  regurgitation. No evidence of mitral stenosis.   4. The aortic valve is calcified. Aortic valve regurgitation is not  visualized. No aortic stenosis is present.    Physical Exam:   VS:  BP (!) 106/54   Pulse (!) 59   Ht 5' 3.5 (1.613 m)   Wt 214 lb 3.2 oz (97.2 kg)   SpO2 98%   BMI 37.35 kg/m    Wt Readings from Last 3 Encounters:  11/17/23 214 lb 3.2 oz (97.2 kg)  11/09/23 251 lb 15.8 oz (114.3 kg)  08/19/23 251 lb 15.8 oz (114.3 kg)     GEN: Pleasant female appearing in no acute distress. Sitting in wheelchair.  NECK: No JVD; No carotid bruits CARDIAC: RRR, no murmurs, rubs, gallops RESPIRATORY:  Clear to auscultation without rales, wheezing or rhonchi  ABDOMEN: Appears non-distended. No obvious abdominal masses. EXTREMITIES: No pitting edema. Left BKA and right foot in boot.    Assessment and Plan:   1. Coronary artery disease involving native coronary artery of native heart with other form of angina pectoris - Most recent cardiac catheterization in 07/2021 showed patent stents along the  RCA and mid-LAD with only mild, nonobstructive disease as outlined above. She does report episodes of chest pain which overall have atypical qualities as they occur at night but she does report brief symptoms at times with exertion. Reviewed options with the patient and will start low-dose Imdur  15 mg daily. We reviewed that she could titrate to 30 mg daily if she does not develop a headache with the medication and blood pressure remains stable. If unable to tolerate, would switch to Ranexa. Continue Plavix  75 mg daily (allergic to ASA) and Crestor  40 mg daily. Given her bradycardia, will reduce Lopressor  to 12.5 mg twice daily.  2. History of atrial fibrillation - She had an isolated episode of atrial fibrillation back in 2021 with no known recurrence. She has not been started on anticoagulation.  3. Essential hypertension - Blood pressure is soft at 106/54 during today's visit. Given this along with bradycardia, will reduce Lopressor  to 12.5 mg twice daily. Given initiation of Imdur  as discussed above, encouraged to follow BP in the ambulatory setting.  4. Hyperlipidemia LDL  goal <70 - LDL was at 36 when checked in 07/2023. Continue Crestor  40 mg daily.  5. Lower Extremity Edema - This has been chronic and felt to be due to dependent edema as she has been wheelchair-bound mostly for the past few years.  Remains on Torsemide  40 mg daily. Creatinine was stable at 0.91 when checked on 11/14/2023.  6. Diabetic ulcer of right heel associated with type 2 diabetes mellitus, with fat layer exposed (HCC) - Diagnosed during her recent admission and it was felt that she would require amputation in the future but she declined at that time. She is being followed closely by Home Health and the Wound Clinic. She also has follow-up with Endocrinology and her daughters have been encouraging her to make dietary changes given her Hgb A1c of 13.  Signed, Laymon [name] Qua, PA-C

## 2023-11-16 NOTE — Telephone Encounter (Signed)
 Pt was overbooked on first available day and sent mychart message with appt date and Whitney's instructions to follow up with PCP in meantime.  Also added pt to wait list in case something opens sooner

## 2023-11-17 ENCOUNTER — Encounter: Payer: Self-pay | Admitting: Student

## 2023-11-17 ENCOUNTER — Telehealth: Payer: Self-pay

## 2023-11-17 ENCOUNTER — Ambulatory Visit: Attending: Student | Admitting: Student

## 2023-11-17 VITALS — BP 106/54 | HR 59 | Ht 63.5 in | Wt 214.2 lb

## 2023-11-17 DIAGNOSIS — I130 Hypertensive heart and chronic kidney disease with heart failure and stage 1 through stage 4 chronic kidney disease, or unspecified chronic kidney disease: Secondary | ICD-10-CM | POA: Diagnosis not present

## 2023-11-17 DIAGNOSIS — J449 Chronic obstructive pulmonary disease, unspecified: Secondary | ICD-10-CM | POA: Diagnosis not present

## 2023-11-17 DIAGNOSIS — I872 Venous insufficiency (chronic) (peripheral): Secondary | ICD-10-CM | POA: Diagnosis not present

## 2023-11-17 DIAGNOSIS — R600 Localized edema: Secondary | ICD-10-CM

## 2023-11-17 DIAGNOSIS — Z8679 Personal history of other diseases of the circulatory system: Secondary | ICD-10-CM

## 2023-11-17 DIAGNOSIS — I10 Essential (primary) hypertension: Secondary | ICD-10-CM | POA: Diagnosis not present

## 2023-11-17 DIAGNOSIS — I25118 Atherosclerotic heart disease of native coronary artery with other forms of angina pectoris: Secondary | ICD-10-CM

## 2023-11-17 DIAGNOSIS — I2510 Atherosclerotic heart disease of native coronary artery without angina pectoris: Secondary | ICD-10-CM | POA: Diagnosis not present

## 2023-11-17 DIAGNOSIS — E11621 Type 2 diabetes mellitus with foot ulcer: Secondary | ICD-10-CM

## 2023-11-17 DIAGNOSIS — E1122 Type 2 diabetes mellitus with diabetic chronic kidney disease: Secondary | ICD-10-CM | POA: Diagnosis not present

## 2023-11-17 DIAGNOSIS — Z7985 Long-term (current) use of injectable non-insulin antidiabetic drugs: Secondary | ICD-10-CM | POA: Diagnosis not present

## 2023-11-17 DIAGNOSIS — Z556 Problems related to health literacy: Secondary | ICD-10-CM | POA: Diagnosis not present

## 2023-11-17 DIAGNOSIS — E02 Subclinical iodine-deficiency hypothyroidism: Secondary | ICD-10-CM | POA: Diagnosis not present

## 2023-11-17 DIAGNOSIS — G250 Essential tremor: Secondary | ICD-10-CM | POA: Diagnosis not present

## 2023-11-17 DIAGNOSIS — I4891 Unspecified atrial fibrillation: Secondary | ICD-10-CM | POA: Diagnosis not present

## 2023-11-17 DIAGNOSIS — H6123 Impacted cerumen, bilateral: Secondary | ICD-10-CM | POA: Diagnosis not present

## 2023-11-17 DIAGNOSIS — Z7984 Long term (current) use of oral hypoglycemic drugs: Secondary | ICD-10-CM | POA: Diagnosis not present

## 2023-11-17 DIAGNOSIS — L97412 Non-pressure chronic ulcer of right heel and midfoot with fat layer exposed: Secondary | ICD-10-CM | POA: Diagnosis not present

## 2023-11-17 DIAGNOSIS — N182 Chronic kidney disease, stage 2 (mild): Secondary | ICD-10-CM | POA: Diagnosis not present

## 2023-11-17 DIAGNOSIS — Z5982 Transportation insecurity: Secondary | ICD-10-CM | POA: Diagnosis not present

## 2023-11-17 DIAGNOSIS — E785 Hyperlipidemia, unspecified: Secondary | ICD-10-CM

## 2023-11-17 DIAGNOSIS — M1990 Unspecified osteoarthritis, unspecified site: Secondary | ICD-10-CM | POA: Diagnosis not present

## 2023-11-17 DIAGNOSIS — I509 Heart failure, unspecified: Secondary | ICD-10-CM | POA: Diagnosis not present

## 2023-11-17 DIAGNOSIS — Z7902 Long term (current) use of antithrombotics/antiplatelets: Secondary | ICD-10-CM | POA: Diagnosis not present

## 2023-11-17 DIAGNOSIS — L89220 Pressure ulcer of left hip, unstageable: Secondary | ICD-10-CM | POA: Diagnosis not present

## 2023-11-17 DIAGNOSIS — Z87891 Personal history of nicotine dependence: Secondary | ICD-10-CM | POA: Diagnosis not present

## 2023-11-17 DIAGNOSIS — E559 Vitamin D deficiency, unspecified: Secondary | ICD-10-CM | POA: Diagnosis not present

## 2023-11-17 MED ORDER — METOPROLOL TARTRATE 25 MG PO TABS: 12.5000 mg | ORAL_TABLET | Freq: Two times a day (BID) | ORAL | 3 refills | Status: AC

## 2023-11-17 MED ORDER — ISOSORBIDE MONONITRATE ER 30 MG PO TB24: 30.0000 mg | ORAL_TABLET | Freq: Every day | ORAL | 3 refills | Status: AC

## 2023-11-17 NOTE — Transitions of Care (Post Inpatient/ED Visit) (Signed)
 11/17/2023  Name: Nicole [name] MRN: [id number] DOB: June 11, 1965  Today's TOC FU Call Status: Today's TOC FU Call Status:: Successful TOC FU Call Completed TOC FU Call Complete Date: 11/17/23 Patient's Name and Date of Birth confirmed.  Transition Care Management Follow-up Telephone Call Date of Discharge: 11/15/23 Discharge Facility: [facility]) Type of Discharge: Inpatient Admission Primary Inpatient Discharge Diagnosis:: Diabetic ulcer of right heel associated with type 2 diabetes mellitus How have you been since you were released from the hospital?: Better Any questions or concerns?: No  Items Reviewed: Did you receive and understand the discharge instructions provided?: Yes Medications obtained,verified, and reconciled?: Yes (Medications Reviewed) Any new allergies since your discharge?: No Dietary orders reviewed?: Yes Type of Diet Ordered:: Heart Healthy Carb Modified Do you have support at home?: Yes People in Home [RPT]: child(ren), adult Name of Support/Comfort Primary Source: Nicole [name] and Vanessa  Medications Reviewed Today: Medications Reviewed Today     Reviewed by Kayln Garceau, RN (Case Manager) on 11/17/23 at 1151  Med List Status: <None>   Medication Order Taking? Sig Documenting Provider Last Dose Status Informant  Accu-Chek Softclix Lancets lancets [id number]  Use as instructed to monitor glucose 4 times daily- use as backup to [employer], Benton PARAS, NP  Active Self, Pharmacy Records, Child, Multiple Informants  acetaminophen  (TYLENOL ) 500 MG tablet [id number] Yes Take 1 tablet (500 mg total) by mouth every 6 (six) hours as needed for moderate pain. Caleen Burgess BROCKS, MD  Active Self, Pharmacy Records, Child, Multiple Informants  albuterol  (VENTOLIN  HFA) [phone] Base) MCG/ACT inhaler [id number] Yes Inhale 2 puffs into the lungs every 6 (six) hours as needed for wheezing. Shortness of breath Elnor Lauraine BRAVO, NP  Active Self, Pharmacy Records, Child, Multiple  Informants  amoxicillin -clavulanate (AUGMENTIN ) 875-125 MG tablet [id number] Yes Take 1 tablet by mouth 2 (two) times daily for 7 days. Gonfa, Taye T, MD  Active   blood glucose meter kit and supplies [id number]  Dispense based on patient and insurance preference. Use four times daily as directed. (FOR ICD-10 E10.9, E11.9). Therisa Benton PARAS, NP  Active Self, Pharmacy Records, Child, Multiple Informants  busPIRone  (BUSPAR ) 5 MG tablet [id number] Yes Take 5 mg by mouth 2 (two) times daily. [provider]  Active Self, Pharmacy Records, Child, Multiple Informants  Cholecalciferol  1.25 MG (50000 UT) capsule [id number] Yes Take 50,000 Units by mouth once a week. [provider]  Active Self, Pharmacy Records, Child, Multiple Informants  clopidogrel  (PLAVIX ) 75 MG tablet [id number] Yes Take 1 tablet by mouth once daily Okey Vina GAILS, MD  Active Self, Pharmacy Records, Child, Multiple Informants  doxycycline  (VIBRA -TABS) 100 MG tablet [id number] Yes Take 1 tablet (100 mg total) by mouth 2 (two) times daily for 7 days. Gonfa, Taye T, MD  Active   EASY TOUCH INSULIN  SYRINGE 31G X 5/16 1 ML MISC [id number]   [provider]  Active Self, Pharmacy Records, Child, Multiple Informants  EPINEPHrine  0.3 mg/0.3 mL IJ SOAJ injection [id number] Yes Inject 0.3 mg into the muscle as needed for anaphylaxis. Elnor Lauraine BRAVO, NP  Active Self, Pharmacy Records, Child, Multiple Informants           Med Note DARILYN, DAWN S   Thu Nov 10, 2023 10:13 AM) Need refill  fluconazole  (DIFLUCAN ) 150 MG tablet [id number]  Take 150 mg by mouth once.  Patient not taking: Reported on 11/17/2023   [provider]  Active Self, Pharmacy Records, Child, Multiple Informants  gabapentin  (NEURONTIN )  300 MG capsule [id number] Yes Take 1 capsule (300 mg total) by mouth at bedtime. Gonfa, Taye T, MD  Active   glucose blood (ACCU-CHEK GUIDE TEST) test strip [id number]  Use as instructed to monitor glucose 4 times daily-  use as back up for Dexcom Reardon, Benton PARAS, NP  Active Self, Pharmacy Records, Child, Multiple Informants  Insulin  Pen Needle (B-D ULTRAFINE III SHORT PEN) 31G X 8 MM MISC [id number]  USE AS DIRECTED Lorren Greig PARAS, NP  Active Self, Pharmacy Records, Child, Multiple Informants  insulin  regular human CONCENTRATED (HUMULIN  R U-500 KWIKPEN) 500 UNIT/ML KwikPen [id number] Yes Inject 90 Units into the skin 3 (three) times daily with meals. Therisa Benton PARAS, NP  Active Self, Pharmacy Records, Child, Multiple Informants  levocetirizine (XYZAL ) 5 MG tablet [id number] Yes Take 5 mg by mouth daily. [provider]  Active Self, Pharmacy Records, Child, Multiple Informants  metoprolol  tartrate (LOPRESSOR ) 25 MG tablet [id number] Yes Take 25 mg by mouth 2 (two) times daily. as directed [provider]  Active Self, Pharmacy Records, Child, Multiple Informants  nitroGLYCERIN  (NITROSTAT ) 0.4 MG SL tablet [id number] Yes DISSOLVE ONE TABLET UNDER THE TONGUE EVERY 5 MINUTES AS NEEDED FOR CHEST PAIN.  DO NOT EXCEED A TOTAL OF 3 DOSES IN 15 MINUTES Okey Vina GAILS, MD  Active Self, Pharmacy Records, Child, Multiple Informants  nystatin  powder [id number] Yes Apply 1 Application topically 3 (three) times daily. Geroldine Berg, MD  Active Self, Pharmacy Records, Child, Multiple Informants  pantoprazole  (PROTONIX ) 40 MG tablet [id number]  Take 1 tablet (40 mg total) by mouth daily.  Patient taking differently: Take 40 mg by mouth at bedtime.   Elnor Lauraine BRAVO, NP  Active Self, Pharmacy Records, Child, Multiple Informants  potassium chloride  SA (KLOR-CON  M20) 20 MEQ tablet [id number] Yes TAKE 1 TABLET BY MOUTH ONCE DAILY. PATIENT IS OVERDUE FOR A FOLLOW-UP APPOINTMENT. PLEASE SCHEDULE AN APPOINTMENT FOR ADDITIONAL REFILLS. Okey Vina GAILS, MD  Active Self, Pharmacy Records, Child, Multiple Informants  rosuvastatin  (CRESTOR ) 40 MG tablet [id number] Yes Take 40 mg by mouth at bedtime. [provider]  Active Self,  Pharmacy Records, Child, Multiple Informants  Semaglutide , 2 MG/DOSE, 8 MG/3ML SOPN [id number]  Inject 2 mg as directed once a week.  Patient not taking: Reported on 11/17/2023   Therisa Benton PARAS, NP  Active Self, Pharmacy Records, Child, Multiple Informants  senna-docusate (SENOKOT-S) 8.6-50 MG tablet [id number] Yes Take 1-2 tablets by mouth 2 (two) times daily between meals as needed for mild constipation or moderate constipation. Gonfa, Taye T, MD  Active   torsemide  (DEMADEX ) 20 MG tablet [id number] Yes Take 2 tablets by mouth once daily Okey Vina GAILS, MD  Active Self, Pharmacy Records, Child, Multiple Informants  traMADol  (ULTRAM ) 50 MG tablet [id number] Yes Take 50 mg by mouth every 8 (eight) hours. [provider]  Active Self, Pharmacy Records, Child, Multiple Informants  VRAYLAR  3 MG capsule [id number] Yes Take 3 mg by mouth daily. [provider]  Active Self, Pharmacy Records, Child, Multiple Informants            Home Care and Equipment/Supplies: Were Home Health Services Ordered?: Yes Name of Home Health Agency:: [facility] Any new equipment or medical supplies ordered?: No  Functional Questionnaire: Do you need assistance with bathing/showering or dressing?: Yes (Daughters assist as needed) Do you need assistance with meal preparation?: Yes (daughter help with meals) Do you need assistance with eating?: No Do you have difficulty maintaining continence: No Do you need  assistance with getting out of bed/getting out of a chair/moving?: No Do you have difficulty managing or taking your medications?: Yes (daughter manage medications)  Follow up appointments reviewed: PCP Follow-up appointment confirmed?: Yes Date of PCP follow-up appointment?: 11/21/23 Follow-up Provider: [facility] Follow-up appointment confirmed?: Yes Date of Specialist follow-up appointment?: 11/22/23 Follow-Up Specialty Provider:: Podiatry Do you need transportation  to your follow-up appointment?: No Do you understand care options if your condition(s) worsen?: Yes-patient verbalized understanding (Monitor for swelling, redness, pain, pus, and fever  Keep wound clean and dry.    Notify physician immediately for any changes)  SDOH Interventions Today    Flowsheet Row Most Recent Value  SDOH Interventions   Food Insecurity Interventions Intervention Not Indicated  Housing Interventions Intervention Not Indicated  Transportation Interventions Intervention Not Indicated  Utilities Interventions Intervention Not Indicated   Discussed and offered 30 day TOC program.  Daughter agreeable.  The patient has been provided with contact information for the care management team and has been advised to call with any health -related questions or concerns.  The patient verbalized understanding with current plan of care.  The patient is directed to their insurance card regarding availability of benefits coverage.   Shantale Holtmeyer J. Travante Knee RN, MSN [facility], [place] [facility] RN Care Manager Direct Dial: [phone]  Fax: [phone] Website: [email or web address]

## 2023-11-17 NOTE — Patient Instructions (Signed)
 Visit Information  Thank you for taking time to visit with me today. Please don't hesitate to contact me if I can be of assistance to you before our next scheduled telephone appointment.  Our next appointment is by telephone on 11/24/23 at 1130 am  Following is a copy of your care plan:   Goals Addressed             This Visit's Progress    VBCI Transitions of Care (TOC) Care Plan       Problems:  Recent Hospitalization for treatment of DMII and Right Heel Infection Knowledge Deficit Related to Right heel wound and left hip wound  Goal:  Over the next 30 days, the patient will not experience hospital readmission  Interventions:  Transitions of Care: Doctor Visits  - discussed the importance of doctor visits Contacted Home health for resumption of care date  Diabetes Interventions: Assessed patient's understanding of A1c goal: <8% Provided education to patient about basic DM disease process Reviewed medications with patient and discussed importance of medication adherence Counseled on importance of regular laboratory monitoring as prescribed Discussed plans with patient for ongoing care management follow up and provided patient with direct contact information for care management team Reviewed scheduled/upcoming provider appointments including: PCP, Podiatry,and wound clinic Assessed social determinant of health barriers Lab Results  Component Value Date   HGBA1C 13.2 (H) 11/10/2023   Diabetes Management Discussed: Medication adherence Reviewed importance of limiting carbs such as rice, potatoes, breads, and pastas. Also discussed limiting sweets and sugary drinks.  Discussed importance of portion control.  Also discussed importance of annual exams, foot exams, and eye exams.  Wound Management Discussed:Take antibiotics as prescribed, Monitor for swelling, redness, pain, pus, and fever, Keep wound clean and dry, Notify physician immediately for any changes   Patient Self  Care Activities:  Attend all scheduled provider appointments Call pharmacy for medication refills 3-7 days in advance of running out of medications Call provider office for new concerns or questions  Notify RN Care Manager of TOC call rescheduling needs Participate in Transition of Care Program/Attend TOC scheduled calls Take medications as prescribed   Monitor for swelling, redness, pain, pus, and fever Keep wound clean and dry.   Notify physician immediately for any changes. Monitor sugars as ordered by physician Limit carbohydrates, sweets and sugary drinks.  Plan:  The patient has been provided with contact information for the care management team and has been advised to call with any health related questions or concerns.         Patient verbalizes understanding of instructions and care plan provided today and agrees to view in MyChart. Active MyChart status and patient understanding of how to access instructions and care plan via MyChart confirmed with patient.     The patient has been provided with contact information for the care management team and has been advised to call with any health related questions or concerns.   Please call the care guide team at [phone] if you need to cancel or reschedule your appointment.   Please call the Suicide and Crisis Lifeline: 988 call the [place]  National Suicide Prevention Lifeline: 720-614-0712 or TTY: 717-725-8297 TTY (305)257-3794) to talk to a trained counselor if you are experiencing a Mental Health or Behavioral Health Crisis or need someone to talk to.  Karey Stucki J. Habib Kise RN, MSN [facility], [facility] RN Care Manager Direct Dial: [phone]  Fax: [phone] Website: [email or web address]

## 2023-11-17 NOTE — Patient Instructions (Signed)
 Medication Instructions:  Your physician has recommended you make the following change in your medication:   Take Imdur  15 mg Daily and if pain continues Increase to 30 mg Daily  Call office if causes headache   Decrease Lopressor  to 12.5 mg Two Times Daily   *If you need a refill on your cardiac medications before your next appointment, please call your pharmacy*  Lab Work: NONE   If you have labs (blood work) drawn today and your tests are completely normal, you will receive your results only by: MyChart Message (if you have MyChart) OR A paper copy in the mail If you have any lab test that is abnormal or we need to change your treatment, we will call you to review the results.  Testing/Procedures: NONE   Follow-Up: At [facility], you and your health needs are our priority.  As part of our continuing mission to provide you with exceptional heart care, our providers are all part of one team.  This team includes your primary Cardiologist (physician) and Advanced Practice Providers or APPs (Physician Assistants and Nurse Practitioners) who all work together to provide you with the care you need, when you need it.  Your next appointment:   3 -4 month(s)  Provider:   You may see Vina Gull, MD or one of the following Advanced Practice Providers on your designated Care Team:   Laymon Qua, PA-C  [place], [place] Olivia [place], [place]     We recommend signing up for the patient portal called MyChart.  Sign up information is provided on this After Visit Summary.  MyChart is used to connect with patients for Virtual Visits (Telemedicine).  Patients are able to view lab/test results, encounter notes, upcoming appointments, etc.  Non-urgent messages can be sent to your provider as well.   To learn more about what you can do with MyChart, go to [email or web address].   Other Instructions Thank you for choosing [facility]!

## 2023-11-20 ENCOUNTER — Other Ambulatory Visit: Payer: Self-pay | Admitting: Internal Medicine

## 2023-11-20 DIAGNOSIS — I10 Essential (primary) hypertension: Secondary | ICD-10-CM

## 2023-11-20 DIAGNOSIS — E785 Hyperlipidemia, unspecified: Secondary | ICD-10-CM

## 2023-11-21 DIAGNOSIS — L97509 Non-pressure chronic ulcer of other part of unspecified foot with unspecified severity: Secondary | ICD-10-CM | POA: Diagnosis not present

## 2023-11-21 DIAGNOSIS — R251 Tremor, unspecified: Secondary | ICD-10-CM | POA: Diagnosis not present

## 2023-11-21 DIAGNOSIS — I10 Essential (primary) hypertension: Secondary | ICD-10-CM | POA: Diagnosis not present

## 2023-11-21 DIAGNOSIS — S71002S Unspecified open wound, left hip, sequela: Secondary | ICD-10-CM | POA: Diagnosis not present

## 2023-11-21 DIAGNOSIS — E782 Mixed hyperlipidemia: Secondary | ICD-10-CM | POA: Diagnosis not present

## 2023-11-21 DIAGNOSIS — Z5320 Procedure and treatment not carried out because of patient's decision for unspecified reasons: Secondary | ICD-10-CM | POA: Diagnosis not present

## 2023-11-21 DIAGNOSIS — T148XXA Other injury of unspecified body region, initial encounter: Secondary | ICD-10-CM | POA: Diagnosis not present

## 2023-11-21 DIAGNOSIS — E11621 Type 2 diabetes mellitus with foot ulcer: Secondary | ICD-10-CM | POA: Diagnosis not present

## 2023-11-21 DIAGNOSIS — R197 Diarrhea, unspecified: Secondary | ICD-10-CM | POA: Diagnosis not present

## 2023-11-21 DIAGNOSIS — E08621 Diabetes mellitus due to underlying condition with foot ulcer: Secondary | ICD-10-CM | POA: Diagnosis not present

## 2023-11-21 DIAGNOSIS — E1165 Type 2 diabetes mellitus with hyperglycemia: Secondary | ICD-10-CM | POA: Diagnosis not present

## 2023-11-21 DIAGNOSIS — Z9989 Dependence on other enabling machines and devices: Secondary | ICD-10-CM | POA: Diagnosis not present

## 2023-11-21 DIAGNOSIS — L97519 Non-pressure chronic ulcer of other part of right foot with unspecified severity: Secondary | ICD-10-CM | POA: Diagnosis not present

## 2023-11-21 DIAGNOSIS — Z7984 Long term (current) use of oral hypoglycemic drugs: Secondary | ICD-10-CM | POA: Diagnosis not present

## 2023-11-22 ENCOUNTER — Ambulatory Visit (INDEPENDENT_AMBULATORY_CARE_PROVIDER_SITE_OTHER): Admitting: Podiatry

## 2023-11-22 DIAGNOSIS — E11621 Type 2 diabetes mellitus with foot ulcer: Secondary | ICD-10-CM

## 2023-11-22 DIAGNOSIS — L97412 Non-pressure chronic ulcer of right heel and midfoot with fat layer exposed: Secondary | ICD-10-CM

## 2023-11-22 NOTE — Progress Notes (Unsigned)
  Subjective:  Patient ID: Nicole [name], female    DOB: 10/03/1965,  MRN: [id number]  Chief Complaint  Patient presents with   Foot Ulcer    1 week f/u ulcer. Right heel. Black discoloration. 5 pain. IDDM A1C 13.2. Wearing surgical shoe.     58 y.o. female presents for follow-up on right heel ulceration.  Was evaluated hospital and felt no debridement was indicated.  She will undergo angiogram per vascular surgery.  Has surgical shoe.  A1c 13.2.  Past Medical History:  Diagnosis Date   CAP (community acquired pneumonia)    Streptococcus 01/2011   COPD (chronic obstructive pulmonary disease) (HCC)    Coronary atherosclerosis of native coronary artery    a. Diagnosed [place]  2006 - DES RCA, reports followup cath 2009 at [facility] b. reported 2 stents in [place]  in 2020. c. 07/2021: cath showing patent stents along RCA and mid-LAD with scattered 20% stenosis but no obstructive disease.   Dyslipidemia    Essential hypertension, benign    Morbid obesity (HCC)    Pancreatitis    Type 2 diabetes mellitus (HCC)     Allergies  Allergen Reactions   Aspirin  Anaphylaxis and Other (See Comments)    Throat closing    Bee Venom Anaphylaxis   Pineapple Anaphylaxis   Definity  [Perflutren  Lipid Microsphere]     Muscle Aches   E-Mycin [Erythromycin Base] Nausea And Vomiting    ROS: Negative except as per HPI above  Objective:  General: AAO x3, NAD  Dermatological: With inspection and palpation of the right and left lower extremities there are no open sores, no preulcerative lesions, no rash or signs of infection present. Nails are of normal length thickness and coloration.   Vascular:  Dorsalis Pedis artery and Posterior Tibial artery pedal pulses are 2/4 bilateral.  Capillary fill time < 3 sec to all digits.   Neruologic: Grossly intact via light touch bilateral. Protective threshold intact to all sites bilateral.   Musculoskeletal: No gross boney pedal deformities bilateral. No pain,  crepitus, or limitation noted with foot and ankle range of motion bilateral. Muscular strength 5/5 in all groups tested bilateral.  Gait: Unassisted, Nonantalgic.   No images are attached to the encounter.  Radiographs:  Date: *** XR {right/left foot:16097} Weightbearing AP/Lateral/Oblique   Findings: {MP Foot XR:23762::no fracture, dislocation, swelling or degenerative changes noted} Assessment:  No diagnosis found.   Plan:  Patient was evaluated and treated and all questions answered.  #*** -***  No follow-ups on file.          [name], DPM [facility] / [facility]

## 2023-11-23 DIAGNOSIS — N182 Chronic kidney disease, stage 2 (mild): Secondary | ICD-10-CM | POA: Diagnosis not present

## 2023-11-23 DIAGNOSIS — Z7902 Long term (current) use of antithrombotics/antiplatelets: Secondary | ICD-10-CM | POA: Diagnosis not present

## 2023-11-23 DIAGNOSIS — L89610 Pressure ulcer of right heel, unstageable: Secondary | ICD-10-CM | POA: Diagnosis not present

## 2023-11-23 DIAGNOSIS — H6123 Impacted cerumen, bilateral: Secondary | ICD-10-CM | POA: Diagnosis not present

## 2023-11-23 DIAGNOSIS — I4891 Unspecified atrial fibrillation: Secondary | ICD-10-CM | POA: Diagnosis not present

## 2023-11-23 DIAGNOSIS — E02 Subclinical iodine-deficiency hypothyroidism: Secondary | ICD-10-CM | POA: Diagnosis not present

## 2023-11-23 DIAGNOSIS — I509 Heart failure, unspecified: Secondary | ICD-10-CM | POA: Diagnosis not present

## 2023-11-23 DIAGNOSIS — Z87891 Personal history of nicotine dependence: Secondary | ICD-10-CM | POA: Diagnosis not present

## 2023-11-23 DIAGNOSIS — G250 Essential tremor: Secondary | ICD-10-CM | POA: Diagnosis not present

## 2023-11-23 DIAGNOSIS — Z7984 Long term (current) use of oral hypoglycemic drugs: Secondary | ICD-10-CM | POA: Diagnosis not present

## 2023-11-23 DIAGNOSIS — Z5982 Transportation insecurity: Secondary | ICD-10-CM | POA: Diagnosis not present

## 2023-11-23 DIAGNOSIS — L97129 Non-pressure chronic ulcer of left thigh with unspecified severity: Secondary | ICD-10-CM | POA: Diagnosis not present

## 2023-11-23 DIAGNOSIS — Z556 Problems related to health literacy: Secondary | ICD-10-CM | POA: Diagnosis not present

## 2023-11-23 DIAGNOSIS — E559 Vitamin D deficiency, unspecified: Secondary | ICD-10-CM | POA: Diagnosis not present

## 2023-11-23 DIAGNOSIS — Z7985 Long-term (current) use of injectable non-insulin antidiabetic drugs: Secondary | ICD-10-CM | POA: Diagnosis not present

## 2023-11-23 DIAGNOSIS — M1990 Unspecified osteoarthritis, unspecified site: Secondary | ICD-10-CM | POA: Diagnosis not present

## 2023-11-23 DIAGNOSIS — I2510 Atherosclerotic heart disease of native coronary artery without angina pectoris: Secondary | ICD-10-CM | POA: Diagnosis not present

## 2023-11-23 DIAGNOSIS — I872 Venous insufficiency (chronic) (peripheral): Secondary | ICD-10-CM | POA: Diagnosis not present

## 2023-11-23 DIAGNOSIS — J449 Chronic obstructive pulmonary disease, unspecified: Secondary | ICD-10-CM | POA: Diagnosis not present

## 2023-11-23 DIAGNOSIS — L89220 Pressure ulcer of left hip, unstageable: Secondary | ICD-10-CM | POA: Diagnosis not present

## 2023-11-23 DIAGNOSIS — E1122 Type 2 diabetes mellitus with diabetic chronic kidney disease: Secondary | ICD-10-CM | POA: Diagnosis not present

## 2023-11-23 DIAGNOSIS — I130 Hypertensive heart and chronic kidney disease with heart failure and stage 1 through stage 4 chronic kidney disease, or unspecified chronic kidney disease: Secondary | ICD-10-CM | POA: Diagnosis not present

## 2023-11-24 ENCOUNTER — Other Ambulatory Visit: Payer: Self-pay

## 2023-11-24 ENCOUNTER — Ambulatory Visit (HOSPITAL_COMMUNITY): Attending: Family Medicine | Admitting: Physical Therapy

## 2023-11-24 DIAGNOSIS — L89223 Pressure ulcer of left hip, stage 3: Secondary | ICD-10-CM | POA: Diagnosis present

## 2023-11-24 DIAGNOSIS — M25552 Pain in left hip: Secondary | ICD-10-CM | POA: Insufficient documentation

## 2023-11-24 DIAGNOSIS — M79672 Pain in left foot: Secondary | ICD-10-CM | POA: Diagnosis present

## 2023-11-24 DIAGNOSIS — E11621 Type 2 diabetes mellitus with foot ulcer: Secondary | ICD-10-CM | POA: Diagnosis present

## 2023-11-24 DIAGNOSIS — L97428 Non-pressure chronic ulcer of left heel and midfoot with other specified severity: Secondary | ICD-10-CM | POA: Diagnosis present

## 2023-11-24 NOTE — Therapy (Signed)
 OUTPATIENT PHYSICAL THERAPY Wound  EVALUATION   Patient Name: Nicole [name] MRN: [id number] DOB:Mar 09, 1965, 58 y.o., female Today's Date: 11/24/2023   PCP: Norleen Hurst REFERRING PROVIDER: Hyacinth Honey, NP  END OF SESSION:  PT End of Session - 11/24/23 1240     Visit Number 1    Number of Visits 16    Date for Recertification  01/19/24    Authorization Type Dual complete    PT Start Time 1020    PT Stop Time 1115    PT Time Calculation (min) 55 min    Activity Tolerance Patient tolerated treatment well    Behavior During Therapy [facility] for tasks assessed/performed          Past Medical History:  Diagnosis Date   CAP (community acquired pneumonia)    Streptococcus 01/2011   COPD (chronic obstructive pulmonary disease) (HCC)    Coronary atherosclerosis of native coronary artery    a. Diagnosed [place]  2006 - DES RCA, reports followup cath 2009 at [facility] b. reported 2 stents in [place]  in 2020. c. 07/2021: cath showing patent stents along RCA and mid-LAD with scattered 20% stenosis but no obstructive disease.   Dyslipidemia    Essential hypertension, benign    Morbid obesity (HCC)    Pancreatitis    Type 2 diabetes mellitus (HCC)    Past Surgical History:  Procedure Laterality Date   ABDOMINAL AORTOGRAM N/A 11/14/2023   Procedure: ABDOMINAL AORTOGRAM;  Surgeon: Sheree Penne Bruckner, MD;  Location: [facility] INVASIVE CV LAB;  Service: Cardiovascular;  Laterality: N/A;   ABDOMINAL HERNIA REPAIR     ABDOMINAL HYSTERECTOMY     AMPUTATION Left 01/25/2020   Procedure: LEFT BELOW KNEE AMPUTATION;  Surgeon: Harden Jerona GAILS, MD;  Location: [facility] OR;  Service: Orthopedics;  Laterality: Left;   CORONARY ANGIOPLASTY WITH STENT PLACEMENT  2006   KNEE SURGERY     LEFT HEART CATH AND CORONARY ANGIOGRAPHY N/A 08/14/2021   Procedure: LEFT HEART CATH AND CORONARY ANGIOGRAPHY;  Surgeon: Burnard Debby LABOR, MD;  Location: [facility] INVASIVE CV LAB;  Service: Cardiovascular;  Laterality: N/A;   LOWER  EXTREMITY ANGIOGRAPHY Right 11/14/2023   Procedure: Lower Extremity Angiography;  Surgeon: Sheree Penne Bruckner, MD;  Location: [facility] INVASIVE CV LAB;  Service: Cardiovascular;  Laterality: Right;   NOSE SURGERY     Pilonidal cystectomy     TONSILLECTOMY     Patient Active Problem List   Diagnosis Date Noted   Diabetic ulcer of right heel associated with type 2 diabetes mellitus (HCC) 11/10/2023   Pressure ulcer of left thigh 11/10/2023   Tremor 11/10/2023   Bilateral cataracts 09/09/2022   Essential tremor 09/09/2022   Trigger finger, unspecified finger 09/09/2022   Anxiety 12/30/2021   OSA on CPAP 12/09/2021   Atopic dermatitis 09/24/2021   Venous stasis ulcer of right lower leg with edema of right lower leg (HCC) 09/24/2021   Urinary incontinence 05/21/2021   Oxygen  dependent 05/14/2021   Chronic kidney disease due to type 2 diabetes mellitus (HCC) 04/16/2021   Subclinical hypothyroidism 04/15/2021   Hyperlipidemia 04/02/2021   Gastroesophageal reflux disease without esophagitis 04/02/2021   Osteoarthritis 04/02/2021   Morbid obesity (HCC) 04/02/2021   Hx of BKA, left (HCC) 09/09/2020   Vitamin D  deficiency 07/30/2020   Anxiety and depression 07/30/2020   Atrial fibrillation (HCC) 07/30/2020   Obesity, Class III, BMI 40-49.9 (morbid obesity) (HCC) 01/23/2020   Tobacco abuse--2 P/day Smoker 01/23/2020   Hypoalbuminemia due to protein-calorie malnutrition 01/22/2020   Uncontrolled  type 2 diabetes mellitus with hyperglycemia, with long-term current use of insulin  (HCC) 01/22/2020   Elevated alpha fetoprotein 01/22/2020   Diabetic ulcer of left foot (HCC) 12/05/2019   Heart failure, unspecified (HCC) 03/29/2019   TIA (transient ischemic attack) 04/10/2012   COPD (chronic obstructive pulmonary disease) (HCC) 11/09/2011   Coronary artery disease involving native coronary artery of native heart with angina pectoris 02/19/2011   Dyslipidemia    Type 2 diabetes mellitus with  obesity    Primary hypertension     ONSET DATE: chronic   REFERRING DIAG: L89.61  THERAPY DIAG:  E11.621 M79.671  Rationale for Evaluation and Treatment: Rehabilitation Pt recently admitted into the hospital  Admit date: 11/09/2023 Discharge date: 11/15/23  58 year old F with PMH of COPD, CAD, IDDM-2, OSA on CPAP but not working, A-fib not on [facility], morbid obesity, left BKA, HTN, tremor, HLD, GERD and prior tobacco use presented to [facility] with worsening right heel wound with pain, swelling pus drainage for about a week or 2, and admitted to [facility] with diabetic right heel ulcer after consultation with podiatry.  MRI negative for abscess or osteomyelitis but concerning for cellulitis.  ABI with mild disease.  VVS consulted.  Angiogram without significant finding.  Podiatry recommended p.o. Augmentin  and doxycycline  for 7 more days, wound care and outpatient follow-up.  11/14/2023 Pre-operative Diagnosis: Right heel ulceration Post-operative diagnosis:  Same Surgeon:  Penne C. Sheree, MD Angiography performed with noted blockage of the iliac and femoral artery.  Not intervention was performed.  It was felt that the pt most likely needed but pt refused at this time.      Wound Therapy - 11/24/23 0001     Subjective PT states that her lt heel wound hurts but her Lt hip wound does not.    Patient and Family Stated Goals wounds to heal    Date of Onset 07/24/23    Prior Treatments self care, hospitalization and HH    Pain Score 5     Pain Location Heel    Pain Orientation Left    Pain Descriptors / Indicators Aching    Pain Intervention(s) Emotional support    Evaluation and Treatment Procedures Explained to Patient/Family Yes    Evaluation and Treatment Procedures agreed to    Wound Properties Date First Assessed: 11/10/23 Time First Assessed: 0339 Present on Original Admission: Yes Primary Wound Type: Diabetic Ulcer Location: Heel Location Orientation: Posterior;Right    Wound Image Images linked: 1    Site / Wound Assessment Black;Yellow    Wound Length (cm) 6 cm    Wound Width (cm) 5 cm    Wound Surface Area (cm^2) 23.56 cm^2    Wound Depth (cm) --   unknown   Drainage Amount Scant    Treatments Cleansed;Site care;Other (Comment)   debrided areas around black that had openings   Dressing Type Gauze (Comment)    Dressing Changed Changed    Dressing Status Dry;Intact    % Wound base Red or Granulating 5%    % Wound base Yellow/Fibrinous Exudate 45%    % Wound base Black/Eschar 60%    Wound Properties Date First Assessed: 11/10/23 Time First Assessed: 0337 Present on Original Admission: Yes Primary Wound Type: Pressure Injury Location: Hip Location Orientation: Left;Lateral Wound Description (Comments): circular wound, minimal serous drainage. pt being treated as outpatient by home health.   Wound Image Images linked: 1    Site / Wound Assessment Dry;Pink    Peri-wound Assessment  Intact    Wound Length (cm) 2 cm    Wound Width (cm) 1.5 cm    Wound Surface Area (cm^2) 2.36 cm^2    Wound Depth (cm) --   center of wound is mushy, able to push in at least 1.8 cm   Drainage Amount None   although family states significant throughout the day   Treatment Cleansed;Debridement (Selective);Off loading    Dressing Type Silver hydrofiber;Gauze (Comment);Abdominal pads    Dressing Changed New    Dressing Status Intact    % Wound base Red or Granulating 50%    % Wound base Yellow/Fibrinous Exudate 50%    Wound Therapy - Clinical Statement see above    Wound Therapy - Functional Problem List unable to weight bear    Factors Delaying/Impairing Wound Healing Altered sensation;Diabetes Mellitus;Infection - systemic/local;Incontinence;Immobility;Multiple medical problems;Polypharmacy    Hydrotherapy Plan Debridement;Dressing change;Patient/family education;Other (comment)   off loading   Wound Therapy - Frequency 2X / week   8 weeks   Wound Therapy - Current  Recommendations PT    Wound Plan See for debridement as needed as dressing change as needed.    Dressing  Lt hip:  silver hydrofiber, cut out of ab pad and foam followed by medipore tape    Dressing Rt Heel:  vaseline to callous area, iodine  to black, un load with cut off of ab pad and 1/2 foam followed by kerlix, cotton and coban to make a football dressing            PATIENT EDUCATION: Education details: The importance of off loading as much as possible,  Keep dressing on unless it becomes painful or wet ; complete exercises  Person educated: Patient Education method: Explanation Education comprehension: verbalized understanding   HOME EXERCISE PROGRAM: Ankle pumps, toe crunches    GOALS: Goals reviewed with patient? No  SHORT TERM GOALS: Target date: 12/22/23  Wounds to be 100% granulated  Baseline: Goal status: INITIAL  2.  Rt heel pain to be decreased to no higher than a 2/10 Baseline:  Goal status: INITIAL  3. Family to be knowledgeable of the importance of off loading wound areas.  Baseline:  Goal status: INITIAL   LONG TERM GOALS: Target date: 01/19/24  Wounds to be healed.  Baseline:  Goal status: INITIAL  2.  PT to have no heel pain Baseline:  Goal status: INITIAL  3.  PT family  to verbalize the importance of using diabetic shoes at all time, with daily foot inspection.  Baseline:  Goal status: INITIAL  ASSESSMENT:  CLINICAL IMPRESSION: Patient is a 58 y.o. female who was seen today for physical therapy evaluation and treatment for Lt/ hip wound and Right heel wound.  The patient has multiple co-morbidities, (please see above),.The pt was recently admitted into the hospital for the heel wound, vascular recommended a BKA on her Rt but pt refused.  The patient also has a wound on her LT hip.  This wound has a liquifaction noted in the center of the wound.  She has now been referred to skilled PT for wound care.  Ms. Brim will benefit from skilled  PT for debridement, off loading and appropriate dressing change to create a healing environment for both her Rt heel and her Lt hip wound.       OBJECTIVE IMPAIRMENTS: decreased mobility, impaired sensation, prosthetic dependency , obesity, pain, and decreased skin integrity.   ACTIVITY LIMITATIONS: transfers  PERSONAL FACTORS: Fitness, Past/current experiences, Time since onset of injury/illness/exacerbation,  and 3+ comorbidities: DM, COPD, CAD  are also affecting patient's functional outcome.   REHAB POTENTIAL: Fair    CLINICAL DECISION MAKING: Evolving/moderate complexity  EVALUATION COMPLEXITY: High  PLAN: PT FREQUENCY: 2x/week  PT DURATION: 8 weeks  PLANNED INTERVENTIONS: 97110-Therapeutic exercises, 97535- Self Care, 02859- Manual therapy, 97598- Wound care (each additional 20 sq cm), and Patient/Family education  PLAN FOR NEXT SESSION: assess comfort level of dressing, continue with wound care.   Montie Metro, PT CLT [phone]  11/24/2023, 12:41 PM

## 2023-11-24 NOTE — Patient Instructions (Signed)
 Visit Information  Thank you for taking time to visit with me today. Please don't hesitate to contact me if I can be of assistance to you before our next scheduled telephone appointment.  Our next appointment is by telephone on 12/01/23 at 1200 pm  Following is a copy of your care plan:   Goals Addressed             This Visit's Progress    VBCI Transitions of Care (TOC) Care Plan       Problems:  Recent Hospitalization for treatment of DMII and Right Heel Infection Knowledge Deficit Related to Right heel wound and left hip wound  Goal:  Over the next 30 days, the patient will not experience hospital readmission  Interventions:  Transitions of Care: Doctor Visits  - discussed the importance of doctor visits Discussed outpatient therapy for wound care.  Return visit scheduled 11/29/23     Diabetes Interventions: Assessed patient's understanding of A1c goal: <8% Reviewed medications with patient and discussed importance of medication adherence Discussed plans with patient for ongoing care management follow up and provided patient with direct contact information for care management team Reviewed scheduled/upcoming provider appointments including: PCP, Podiatry,and wound clinic Lab Results  Component Value Date   HGBA1C 13.2 (H) 11/10/2023   Diabetes Management Discussed: Medication adherence Reiterated importance of limiting carbs such as rice, potatoes, breads, and pastas.   Wound Management Discussed:Take antibiotics as prescribed, Monitor for swelling, redness, pain, pus, and fever, Keep wound clean and dry, Notify physician immediately for any changes   Patient Self Care Activities:  Attend all scheduled provider appointments Call pharmacy for medication refills 3-7 days in advance of running out of medications Call provider office for new concerns or questions  Notify RN Care Manager of TOC call rescheduling needs Participate in Transition of Care Program/Attend TOC  scheduled calls Take medications as prescribed   Monitor for swelling, redness, pain, pus, and fever Keep wound clean and dry.   Notify physician immediately for any changes. Monitor sugars as ordered by physician Limit carbohydrates, sweets and sugary drinks.  Plan:  The patient has been provided with contact information for the care management team and has been advised to call with any health related questions or concerns.         Patient verbalizes understanding of instructions and care plan provided today and agrees to view in MyChart. Active MyChart status and patient understanding of how to access instructions and care plan via MyChart confirmed with patient.     The patient has been provided with contact information for the care management team and has been advised to call with any health related questions or concerns.   Please call the care guide team at [phone] if you need to cancel or reschedule your appointment.   Please call the Suicide and Crisis Lifeline: 988 if you are experiencing a Mental Health or Behavioral Health Crisis or need someone to talk to.  Tamelia Michalowski J. Daleyza Gadomski RN, MSN [facility], [facility] RN Care Manager Direct Dial: [phone]  Fax: [phone] Website: [email or web address]

## 2023-11-24 NOTE — Transitions of Care (Post Inpatient/ED Visit) (Signed)
 Transition of Care week 2  Visit Note  11/24/2023  Name: Nicole [name] MRN: [id number]          DOB: 23-Dec-1965  Situation: Patient enrolled in [facility] 30-day program. Visit completed with daughter Nicole [name]) by telephone.   Background:   Initial Transition Care Management Follow-up Telephone Call    Past Medical History:  Diagnosis Date   CAP (community acquired pneumonia)    Streptococcus 01/2011   COPD (chronic obstructive pulmonary disease) (HCC)    Coronary atherosclerosis of native coronary artery    a. Diagnosed [place]  2006 - DES RCA, reports followup cath 2009 at [facility] b. reported 2 stents in [place]  in 2020. c. 07/2021: cath showing patent stents along RCA and mid-LAD with scattered 20% stenosis but no obstructive disease.   Dyslipidemia    Essential hypertension, benign    Morbid obesity (HCC)    Pancreatitis    Type 2 diabetes mellitus (HCC)     Assessment: Patient Reported Symptoms: Cognitive Cognitive Status: Alert and oriented to person, place, and time, Normal speech and language skills (per daughter)      Neurological Neurological Review of Symptoms: No symptoms reported    HEENT HEENT Symptoms Reported: No symptoms reported      Cardiovascular Cardiovascular Symptoms Reported: No symptoms reported    Respiratory Respiratory Symptoms Reported: No symptoms reported    Endocrine Endocrine Symptoms Reported: No symptoms reported Is patient diabetic?: Yes Is patient checking blood sugars at home?: Yes List most recent blood sugar readings, include date and time of day: Daughter reprots sugars up and down but mainly in normal range. She does not have patient most recent sugar reading.  Reiterated importance of blood sugar management in wound healing. Endocrine Self-Management Outcome: 4 (good)  Gastrointestinal Gastrointestinal Symptoms Reported: No symptoms reported      Genitourinary Genitourinary Symptoms Reported: No symptoms reported     Integumentary Integumentary Symptoms Reported: Wound Additional Integumentary Details: Left hip and right heel wounds.  Patient seeing outpatient therapy for wounds.  Daughter reports dressing to remain intact until next visit on 11-29-23 Skin Management Strategies: Routine screening, Medication therapy Skin Self-Management Outcome: 3 (uncertain)  Musculoskeletal Musculoskelatal Symptoms Reviewed: Weakness Additional Musculoskeletal Details: Uses walker with ambulation and family assists as needed. Musculoskeletal Management Strategies: Exercise Falls in the past year?: No    Psychosocial Psychosocial Symptoms Reported: Not assessed Additional Psychological Details: daughter Nicole Completes assessment         There were no vitals filed for this visit.  Medications Reviewed Today     Reviewed by Nicole Uzelac, RN (Case Manager) on 11/24/23 at 1136  Med List Status: <None>   Medication Order Taking? Sig Documenting Provider Last Dose Status Informant  Accu-Chek Softclix Lancets lancets [id number] Yes Use as instructed to monitor glucose 4 times daily- use as backup to Dexcom Reardon, Nicole PARAS, NP  Active Self, Pharmacy Records, Child, Multiple Informants  albuterol  (VENTOLIN  HFA) 108 (90 Base) MCG/ACT inhaler [id number] Yes Inhale 2 puffs into the lungs every 6 (six) hours as needed for wheezing. Shortness of breath Nicole Lauraine BRAVO, NP  Active Self, Pharmacy Records, Child, Multiple Informants  busPIRone  (BUSPAR ) 5 MG tablet [id number] Yes Take 5 mg by mouth 2 (two) times daily. [provider]  Active Self, Pharmacy Records, Child, Multiple Informants  Cholecalciferol  1.25 MG (50000 UT) capsule [id number] Yes Take 50,000 Units by mouth once a week. [provider]  Active Self, Pharmacy Records, Child, Multiple Informants  clopidogrel  (PLAVIX ) 75  MG tablet [id number] Yes Take 1 tablet by mouth once daily Nicole Vina GAILS, MD  Active   EASY TOUCH INSULIN  SYRINGE 31G X 5/16 1 ML  MISC [id number] Yes  [provider]  Active Self, Pharmacy Records, Child, Multiple Informants  EPINEPHrine  0.3 mg/0.3 mL IJ SOAJ injection [id number] Yes Inject 0.3 mg into the muscle as needed for anaphylaxis. Nicole Lauraine BRAVO, NP  Active Self, Pharmacy Records, Child, Multiple Informants           Med Note Nicole [name]   Thu Nov 10, 2023 10:13 AM) Need refill  gabapentin  (NEURONTIN ) 300 MG capsule [id number] Yes Take 1 capsule (300 mg total) by mouth at bedtime. [name], Nicole T, MD  Active   glucose blood (ACCU-CHEK GUIDE TEST) test strip [id number]  Use as instructed to monitor glucose 4 times daily- use as back up for Dexcom Reardon, Nicole PARAS, NP  Active Self, Pharmacy Records, Child, Multiple Informants  Insulin  Pen Needle (B-D ULTRAFINE III SHORT PEN) 31G X 8 MM MISC [id number]  USE AS DIRECTED Nicole Greig PARAS, NP  Active Self, Pharmacy Records, Child, Multiple Informants  insulin  regular human CONCENTRATED (HUMULIN  R U-500 KWIKPEN) 500 UNIT/ML KwikPen [id number]  Inject 90 Units into the skin 3 (three) times daily with meals. Nicole Nicole PARAS, NP  Active Self, Pharmacy Records, Child, Multiple Informants  isosorbide  mononitrate (IMDUR ) 30 MG 24 hr tablet [id number]  Take 1 tablet (30 mg total) by mouth daily. [name], [name], PA-C  Active   levocetirizine (XYZAL ) 5 MG tablet [id number]  Take 5 mg by mouth daily. [provider]  Active Self, Pharmacy Records, Child, Multiple Informants  metoprolol  tartrate (LOPRESSOR ) 25 MG tablet [id number]  Take 0.5 tablets (12.5 mg total) by mouth 2 (two) times daily. [name], [name], PA-C  Active   nitroGLYCERIN  (NITROSTAT ) 0.4 MG SL tablet [id number]  DISSOLVE ONE TABLET UNDER THE TONGUE EVERY 5 MINUTES AS NEEDED FOR CHEST PAIN.  DO NOT EXCEED A TOTAL OF 3 DOSES IN 15 MINUTES Nicole Vina GAILS, MD  Active Self, Pharmacy Records, Child, Multiple Informants  nystatin  powder [id number]  Apply 1 Application topically 3 (three) times daily. Nicole Berg, MD  Active Self, Pharmacy Records, Child, Multiple Informants  pantoprazole  (PROTONIX ) 40 MG tablet [id number]  Take 1 tablet (40 mg total) by mouth daily. Nicole Lauraine BRAVO, NP  Active Self, Pharmacy Records, Child, Multiple Informants  potassium chloride  SA (KLOR-CON  M20) 20 MEQ tablet [id number]  TAKE 1 TABLET BY MOUTH ONCE DAILY. PATIENT IS OVERDUE FOR A FOLLOW-UP APPOINTMENT. PLEASE SCHEDULE AN APPOINTMENT FOR ADDITIONAL REFILLS. Nicole Vina GAILS, MD  Active Self, Pharmacy Records, Child, Multiple Informants  rosuvastatin  (CRESTOR ) 40 MG tablet [id number]  Take 40 mg by mouth at bedtime. [provider]  Active Self, Pharmacy Records, Child, Multiple Informants  Semaglutide , 2 MG/DOSE, 8 MG/3ML SOPN [id number]  Inject 2 mg as directed once a week. Nicole Nicole PARAS, NP  Active Self, Pharmacy Records, Child, Multiple Informants  senna-docusate (SENOKOT-[name]) 8.6-50 MG tablet [id number]  Take 1-2 tablets by mouth 2 (two) times daily between meals as needed for mild constipation or moderate constipation. [name], Nicole T, MD  Active   torsemide  (DEMADEX ) 20 MG tablet [id number]  Take 2 tablets by mouth once daily Nicole Vina GAILS, MD  Active Self, Pharmacy Records, Child, Multiple Informants  traMADol  (ULTRAM ) 50 MG tablet [id number]  Take 50 mg by mouth every 8 (eight) hours. [provider]  Active Self, Pharmacy Records, Child, Multiple  Informants  VRAYLAR  3 MG capsule [id number]  Take 3 mg by mouth daily. [provider]  Active Self, Pharmacy Records, Child, Multiple Informants            Goals Addressed             This Visit'[name] Progress    VBCI Transitions of Care (TOC) Care Plan       Problems:  Recent Hospitalization for treatment of DMII and Right Heel Infection Knowledge Deficit Related to Right heel wound and left hip wound  Goal:  Over the next 30 days, the patient will not experience hospital readmission  Interventions:  Transitions of Care: Doctor Visits   - discussed the importance of doctor visits Discussed outpatient therapy for wound care.  Return visit scheduled 11/29/23     Diabetes Interventions: Assessed patient'[name] understanding of A1c goal: <8% Reviewed medications with patient and discussed importance of medication adherence Discussed plans with patient for ongoing care management follow up and provided patient with direct contact information for care management team Reviewed scheduled/upcoming provider appointments including: PCP, Podiatry,and wound clinic Lab Results  Component Value Date   HGBA1C 13.2 (H) 11/10/2023   Diabetes Management Discussed: Medication adherence Reiterated importance of limiting carbs such as rice, potatoes, breads, and pastas.   Wound Management Discussed:Take antibiotics as prescribed, Monitor for swelling, redness, pain, pus, and fever, Keep wound clean and dry, Notify physician immediately for any changes   Patient Self Care Activities:  Attend all scheduled provider appointments Call pharmacy for medication refills 3-7 days in advance of running out of medications Call provider office for new concerns or questions  Notify RN Care Manager of TOC call rescheduling needs Participate in Transition of Care Program/Attend TOC scheduled calls Take medications as prescribed   Monitor for swelling, redness, pain, pus, and fever Keep wound clean and dry.   Notify physician immediately for any changes. Monitor sugars as ordered by physician Limit carbohydrates, sweets and sugary drinks.  Plan:  The patient has been provided with contact information for the care management team and has been advised to call with any health related questions or concerns.         Recommendation:   Continue Current Plan of Care  Follow Up Plan:   Telephone follow-up in 1 week  Stacy Sailer J. Yvett Rossel RN, MSN [facility], [facility] RN Care Manager Direct Dial: [phone]  Fax:  [phone] Website: [email or web address]

## 2023-11-27 DIAGNOSIS — L89610 Pressure ulcer of right heel, unstageable: Secondary | ICD-10-CM | POA: Diagnosis not present

## 2023-11-27 DIAGNOSIS — L89890 Pressure ulcer of other site, unstageable: Secondary | ICD-10-CM | POA: Diagnosis not present

## 2023-11-28 ENCOUNTER — Encounter (HOSPITAL_COMMUNITY): Payer: Self-pay

## 2023-11-28 ENCOUNTER — Ambulatory Visit (HOSPITAL_COMMUNITY): Payer: Self-pay

## 2023-11-28 ENCOUNTER — Ambulatory Visit (HOSPITAL_BASED_OUTPATIENT_CLINIC_OR_DEPARTMENT_OTHER): Admitting: Internal Medicine

## 2023-11-28 DIAGNOSIS — L89223 Pressure ulcer of left hip, stage 3: Secondary | ICD-10-CM

## 2023-11-28 DIAGNOSIS — E11621 Type 2 diabetes mellitus with foot ulcer: Secondary | ICD-10-CM

## 2023-11-28 DIAGNOSIS — M79672 Pain in left foot: Secondary | ICD-10-CM

## 2023-11-28 NOTE — Therapy (Signed)
 OUTPATIENT PHYSICAL THERAPY Wound  EVALUATION   Patient Name: Nicole [name] MRN: [id number] DOB:12-28-1965, 58 y.o., female Today's Date: 11/28/2023   PCP: Norleen Hurst REFERRING PROVIDER: Hyacinth Honey, NP  END OF SESSION:  PT End of Session - 11/28/23 0920     Visit Number 2    Number of Visits 16    Date for Recertification  01/19/24    Authorization Type Dual complete    PT Start Time 0825    PT Stop Time 0915    PT Time Calculation (min) 50 min    Activity Tolerance Patient tolerated treatment well    Behavior During Therapy [facility] for tasks assessed/performed          Past Medical History:  Diagnosis Date   CAP (community acquired pneumonia)    Streptococcus 01/2011   COPD (chronic obstructive pulmonary disease) (HCC)    Coronary atherosclerosis of native coronary artery    a. Diagnosed [place]  2006 - DES RCA, reports followup cath 2009 at [facility] b. reported 2 stents in [place]  in 2020. c. 07/2021: cath showing patent stents along RCA and mid-LAD with scattered 20% stenosis but no obstructive disease.   Dyslipidemia    Essential hypertension, benign    Morbid obesity (HCC)    Pancreatitis    Type 2 diabetes mellitus (HCC)    Past Surgical History:  Procedure Laterality Date   ABDOMINAL AORTOGRAM N/A 11/14/2023   Procedure: ABDOMINAL AORTOGRAM;  Surgeon: Sheree Penne Bruckner, MD;  Location: [facility] INVASIVE CV LAB;  Service: Cardiovascular;  Laterality: N/A;   ABDOMINAL HERNIA REPAIR     ABDOMINAL HYSTERECTOMY     AMPUTATION Left 01/25/2020   Procedure: LEFT BELOW KNEE AMPUTATION;  Surgeon: Harden Jerona GAILS, MD;  Location: [facility] OR;  Service: Orthopedics;  Laterality: Left;   CORONARY ANGIOPLASTY WITH STENT PLACEMENT  2006   KNEE SURGERY     LEFT HEART CATH AND CORONARY ANGIOGRAPHY N/A 08/14/2021   Procedure: LEFT HEART CATH AND CORONARY ANGIOGRAPHY;  Surgeon: Burnard Debby LABOR, MD;  Location: [facility] INVASIVE CV LAB;  Service: Cardiovascular;  Laterality: N/A;   LOWER  EXTREMITY ANGIOGRAPHY Right 11/14/2023   Procedure: Lower Extremity Angiography;  Surgeon: Sheree Penne Bruckner, MD;  Location: [facility] INVASIVE CV LAB;  Service: Cardiovascular;  Laterality: Right;   NOSE SURGERY     Pilonidal cystectomy     TONSILLECTOMY     Patient Active Problem List   Diagnosis Date Noted   Diabetic ulcer of right heel associated with type 2 diabetes mellitus (HCC) 11/10/2023   Pressure ulcer of left thigh 11/10/2023   Tremor 11/10/2023   Bilateral cataracts 09/09/2022   Essential tremor 09/09/2022   Trigger finger, unspecified finger 09/09/2022   Anxiety 12/30/2021   OSA on CPAP 12/09/2021   Atopic dermatitis 09/24/2021   Venous stasis ulcer of right lower leg with edema of right lower leg (HCC) 09/24/2021   Urinary incontinence 05/21/2021   Oxygen  dependent 05/14/2021   Chronic kidney disease due to type 2 diabetes mellitus (HCC) 04/16/2021   Subclinical hypothyroidism 04/15/2021   Hyperlipidemia 04/02/2021   Gastroesophageal reflux disease without esophagitis 04/02/2021   Osteoarthritis 04/02/2021   Morbid obesity (HCC) 04/02/2021   Hx of BKA, left (HCC) 09/09/2020   Vitamin D  deficiency 07/30/2020   Anxiety and depression 07/30/2020   Atrial fibrillation (HCC) 07/30/2020   Obesity, Class III, BMI 40-49.9 (morbid obesity) (HCC) 01/23/2020   Tobacco abuse--2 P/day Smoker 01/23/2020   Hypoalbuminemia due to protein-calorie malnutrition 01/22/2020   Uncontrolled  type 2 diabetes mellitus with hyperglycemia, with long-term current use of insulin  (HCC) 01/22/2020   Elevated alpha fetoprotein 01/22/2020   Diabetic ulcer of left foot (HCC) 12/05/2019   Heart failure, unspecified (HCC) 03/29/2019   TIA (transient ischemic attack) 04/10/2012   COPD (chronic obstructive pulmonary disease) (HCC) 11/09/2011   Coronary artery disease involving native coronary artery of native heart with angina pectoris 02/19/2011   Dyslipidemia    Type 2 diabetes mellitus with  obesity    Primary hypertension     ONSET DATE: chronic   REFERRING DIAG: L89.61  THERAPY DIAG:  E11.621 M79.671  Rationale for Evaluation and Treatment: Rehabilitation Pt recently admitted into the hospital  Admit date: 11/09/2023 Discharge date: 11/15/23  58 year old F with PMH of COPD, CAD, IDDM-2, OSA on CPAP but not working, A-fib not on [facility] morbid obesity, left BKA, HTN, tremor, HLD, GERD and prior tobacco use presented to [facility] with worsening right heel wound with pain, swelling pus drainage for about a week or 2, and admitted to [facility] with diabetic right heel ulcer after consultation with podiatry.  MRI negative for abscess or osteomyelitis but concerning for cellulitis.  ABI with mild disease.  VVS consulted.  Angiogram without significant finding.  Podiatry recommended p.o. Augmentin  and doxycycline  for 7 more days, wound care and outpatient follow-up.  11/14/2023 Pre-operative Diagnosis: Right heel ulceration Post-operative diagnosis:  Same Surgeon:  Penne C. Sheree, MD Angiography performed with noted blockage of the iliac and femoral artery.  Not intervention was performed.  It was felt that the pt most likely needed but pt refused at this time.      Wound Therapy - 11/28/23 0001     Subjective Pt arrived in wheelchair with both daughters present.  Report need to change dressing due to drainage from Lt hip.  No reports of pain, arrived with new dressings intact.    Patient and Family Stated Goals wounds to heal    Date of Onset 07/24/23    Prior Treatments self care, hospitalization and HH    Pain Scale 0-10    Pain Score 0-No pain    Evaluation and Treatment Procedures Explained to Patient/Family Yes    Evaluation and Treatment Procedures agreed to    Wound Properties Date First Assessed: 11/10/23 Time First Assessed: 0337 Present on Original Admission: Yes Primary Wound Type: Pressure Injury Location: Hip Location Orientation: Left;Lateral Wound  Description (Comments): circular wound, minimal serous drainage. pt being treated as outpatient by home health.   Wound Image   Media Information   Document Information  Photos  Left hip wound  11/28/2023 00:01  Attached To:  Outpatient Rehab on 11/28/23 with Renae Augustin PARAS, PTA  Source Information  Renae Augustin PARAS, PTA  [facility]     Site / Wound Assessment Dry;Pink    Peri-wound Assessment Intact    Drainage Amount Moderate    Treatment Cleansed;Debridement (Selective)    Dressing Type Silver hydrofiber;Gauze (Comment);Abdominal pads    Dressing Changed Changed    Dressing Status Intact;Old drainage    % Wound base Red or Granulating 55%    % Wound base Yellow/Fibrinous Exudate 45%    Margins Unattached edges (unapproximated)    Wound Properties Date First Assessed: 11/10/23 Time First Assessed: 0339 Present on Original Admission: Yes Primary Wound Type: Diabetic Ulcer Location: Heel Location Orientation: Posterior;Right   Wound Image Images linked: 1    Media Information   Document Information  Photos  Right heel wound  11/28/2023 00:01  Attached To:  Outpatient Rehab on 11/28/23 with Renae Augustin PARAS, PTA  Source Information  Renae Augustin PARAS, PTA  [facility]     Site / Wound Assessment Black;Yellow    Drainage Amount Scant    Treatments Cleansed   Cleansed, debridement, dressing change   Dressing Type Gauze (Comment)   vaseline, kerlix, coban with netting   Dressing Changed Changed    Dressing Status Dry;Intact    % Wound base Red or Granulating 5%    % Wound base Yellow/Fibrinous Exudate 45%    % Wound base Black/Eschar 50%    Wound Therapy - Clinical Statement see above    Wound Therapy - Functional Problem List unable to weight bear    Factors Delaying/Impairing Wound Healing Altered sensation;Diabetes Mellitus;Infection - systemic/local;Incontinence;Immobility;Multiple medical problems;Polypharmacy    Hydrotherapy Plan  Debridement;Dressing change;Patient/family education;Other (comment)    Wound Therapy - Frequency 2X / week    Wound Therapy - Current Recommendations PT    Wound Plan See for debridement as needed as dressing change as needed.    Dressing  Lt hip:  silver hydrofiber, cut out of ab pad and foam followed by medipore tape    Dressing Rt Heel:  vaseline to callous area, iodine  to black, un load with cut off of ab pad and 1/2 foam followed by kerlix, cotton and coban to make a football dressing            PATIENT EDUCATION: Education details: The importance of off loading as much as possible,  Keep dressing on unless it becomes painful or wet ; complete exercises  Person educated: Patient Education method: Explanation Education comprehension: verbalized understanding   HOME EXERCISE PROGRAM: Ankle pumps, toe crunches    GOALS: Goals reviewed with patient? No  SHORT TERM GOALS: Target date: 12/22/23  Wounds to be 100% granulated  Baseline: Goal status: INITIAL  2.  Rt heel pain to be decreased to no higher than a 2/10 Baseline:  Goal status: INITIAL  3. Family to be knowledgeable of the importance of off loading wound areas.  Baseline:  Goal status: INITIAL   LONG TERM GOALS: Target date: 01/19/24  Wounds to be healed.  Baseline:  Goal status: INITIAL  2.  PT to have no heel pain Baseline:  Goal status: INITIAL  3.  PT family  to verbalize the importance of using diabetic shoes at all time, with daily foot inspection.  Baseline:  Goal status: INITIAL  ASSESSMENT:  CLINICAL IMPRESSION: 11/28/23:  Able to selective debride the black eschar and dry skin proximal Rt medial ankle with heathy skin present underneath, continues to have all Rt heel covered in black eschar.  This wound has a liquifaction noted in the center of the wound.  Cleansed and selective debridement complete for both heel and hip pain to promote healing.  Picutres in media of both.  Continued with  silverhyrdofiber to address drainage Lt hip.  Discussed sleeping position and encouraged pressure relief with sleeping on Rt side vs Lt.  Reports of comfort at EOS.    Eval:  Patient is a 58 y.o. female who was seen today for physical therapy evaluation and treatment for Lt/ hip wound and Right heel wound.  The patient has multiple co-morbidities, (please see above),.The pt was recently admitted into the hospital for the heel wound, vascular recommended a BKA on her Rt but pt refused.  The patient also has a wound on her LT hip.  This wound has  a liquifaction noted in the center of the wound.  She has now been referred to skilled PT for wound care.  Ms. Cech will benefit from skilled PT for debridement, off loading and appropriate dressing change to create a healing environment for both her Rt heel and her Lt hip wound.       OBJECTIVE IMPAIRMENTS: decreased mobility, impaired sensation, prosthetic dependency , obesity, pain, and decreased skin integrity.   ACTIVITY LIMITATIONS: transfers  PERSONAL FACTORS: Fitness, Past/current experiences, Time since onset of injury/illness/exacerbation, and 3+ comorbidities: DM, COPD, CAD  are also affecting patient's functional outcome.   REHAB POTENTIAL: Fair    CLINICAL DECISION MAKING: Evolving/moderate complexity  EVALUATION COMPLEXITY: High  PLAN: PT FREQUENCY: 2x/week  PT DURATION: 8 weeks  PLANNED INTERVENTIONS: 97110-Therapeutic exercises, 97535- Self Care, 02859- Manual therapy, 97598- Wound care (each additional 20 sq cm), and Patient/Family education  PLAN FOR NEXT SESSION: assess comfort level of dressing, continue with wound care.   Augustin Mclean, LPTA/CLT; [facility] [phone] 11/28/2023, 11:54 AM

## 2023-11-29 ENCOUNTER — Encounter (HOSPITAL_COMMUNITY): Admitting: Physical Therapy

## 2023-12-01 ENCOUNTER — Other Ambulatory Visit: Payer: Self-pay

## 2023-12-01 ENCOUNTER — Encounter (HOSPITAL_COMMUNITY): Payer: Self-pay

## 2023-12-01 ENCOUNTER — Ambulatory Visit: Admitting: Student

## 2023-12-01 ENCOUNTER — Ambulatory Visit (HOSPITAL_COMMUNITY): Payer: Self-pay

## 2023-12-01 DIAGNOSIS — L89223 Pressure ulcer of left hip, stage 3: Secondary | ICD-10-CM

## 2023-12-01 DIAGNOSIS — M79672 Pain in left foot: Secondary | ICD-10-CM

## 2023-12-01 DIAGNOSIS — E11621 Type 2 diabetes mellitus with foot ulcer: Secondary | ICD-10-CM

## 2023-12-01 NOTE — Patient Instructions (Signed)
 Visit Information  Thank you for taking time to visit with me today. Please don't hesitate to contact me if I can be of assistance to you before our next scheduled telephone appointment.  Our next appointment is by telephone on 12/08/23 at 215 pm  Following is a copy of your care plan:   Goals Addressed             This Visit's Progress    VBCI Transitions of Care (TOC) Care Plan       Problems:  Recent Hospitalization for treatment of DMII and Right Heel Infection Knowledge Deficit Related to Right heel wound and left hip wound  Goal:  Over the next 30 days, the patient will not experience hospital readmission  Interventions:  Transitions of Care: Doctor Visits  - discussed the importance of doctor visits Discussed outpatient therapy for wound care.  Return visit scheduled 12/06/23     Diabetes Interventions: Assessed patient's understanding of A1c goal: <8% Reviewed medications with patient and discussed importance of medication adherence Discussed plans with patient for ongoing care management follow up and provided patient with direct contact information for care management team Reviewed scheduled/upcoming provider appointments including:  wound clinic Lab Results  Component Value Date   HGBA1C 13.2 (H) 11/10/2023   Diabetes Management Discussed: Medication adherence Reiterated importance of limiting carbs such as rice, potatoes, breads, and pastas.   Wound Management Discussed: Monitor for swelling, redness, pain, pus, and fever, Keep wound clean and dry, Notify physician immediately for any changes.  Off load pressure to wound areas as much as possible.    Patient Self Care Activities:  Attend all scheduled provider appointments Call pharmacy for medication refills 3-7 days in advance of running out of medications Call provider office for new concerns or questions  Notify RN Care Manager of TOC call rescheduling needs Participate in Transition of Care  Program/Attend TOC scheduled calls Take medications as prescribed   Monitor for swelling, redness, pain, pus, and fever Keep wound clean and dry.   Notify physician immediately for any changes. Monitor sugars as ordered by physician Limit carbohydrates, sweets and sugary drinks.  Plan:  The patient has been provided with contact information for the care management team and has been advised to call with any health related questions or concerns.         Patient verbalizes understanding of instructions and care plan provided today and agrees to view in MyChart. Active MyChart status and patient understanding of how to access instructions and care plan via MyChart confirmed with patient.     The patient has been provided with contact information for the care management team and has been advised to call with any health related questions or concerns.   Please call the care guide team at [phone] if you need to cancel or reschedule your appointment.   Please call the Suicide and Crisis Lifeline: 988 if you are experiencing a Mental Health or Behavioral Health Crisis or need someone to talk to.  Berwyn Bigley J. Colbi Schiltz RN, MSN [facility], [facility] Health RN Care Manager Direct Dial: [phone]  Fax: [phone] Website: [email or web address]

## 2023-12-01 NOTE — Therapy (Signed)
 OUTPATIENT PHYSICAL THERAPY Wound  TREATMENT   Patient Name: Nicole [name] MRN: [id number] DOB:[birth year], 58 y.o., female Today's Date: 12/01/2023   PCP: Norleen Hurst REFERRING PROVIDER: Hyacinth Honey, NP  END OF SESSION:  PT End of Session - 12/01/23 1004     Visit Number 3    Number of Visits 16    Date for Recertification  01/19/24    Authorization Type Dual complete    PT Start Time 0914    PT Stop Time 1000    PT Time Calculation (min) 46 min    Activity Tolerance Patient tolerated treatment well    Behavior During Therapy [facility] for tasks assessed/performed          Past Medical History:  Diagnosis Date   CAP (community acquired pneumonia)    Streptococcus 01/2011   COPD (chronic obstructive pulmonary disease) (HCC)    Coronary atherosclerosis of native coronary artery    a. Diagnosed [place]  2006 - DES RCA, reports followup cath 2009 at [facility] b. reported 2 stents in [place]  in 2020. c. 07/2021: cath showing patent stents along RCA and mid-LAD with scattered 20% stenosis but no obstructive disease.   Dyslipidemia    Essential hypertension, benign    Morbid obesity (HCC)    Pancreatitis    Type 2 diabetes mellitus (HCC)    Past Surgical History:  Procedure Laterality Date   ABDOMINAL AORTOGRAM N/A 11/14/2023   Procedure: ABDOMINAL AORTOGRAM;  Surgeon: Sheree Penne Bruckner, MD;  Location: [facility] INVASIVE CV LAB;  Service: Cardiovascular;  Laterality: N/A;   ABDOMINAL HERNIA REPAIR     ABDOMINAL HYSTERECTOMY     AMPUTATION Left 01/25/2020   Procedure: LEFT BELOW KNEE AMPUTATION;  Surgeon: Harden Jerona GAILS, MD;  Location: [facility] OR;  Service: Orthopedics;  Laterality: Left;   CORONARY ANGIOPLASTY WITH STENT PLACEMENT  2006   KNEE SURGERY     LEFT HEART CATH AND CORONARY ANGIOGRAPHY N/A 08/14/2021   Procedure: LEFT HEART CATH AND CORONARY ANGIOGRAPHY;  Surgeon: Burnard Debby LABOR, MD;  Location: [facility] INVASIVE CV LAB;  Service: Cardiovascular;  Laterality: N/A;   LOWER  EXTREMITY ANGIOGRAPHY Right 11/14/2023   Procedure: Lower Extremity Angiography;  Surgeon: Sheree Penne Bruckner, MD;  Location: [facility] INVASIVE CV LAB;  Service: Cardiovascular;  Laterality: Right;   NOSE SURGERY     Pilonidal cystectomy     TONSILLECTOMY     Patient Active Problem List   Diagnosis Date Noted   Diabetic ulcer of right heel associated with type 2 diabetes mellitus (HCC) 11/10/2023   Pressure ulcer of left thigh 11/10/2023   Tremor 11/10/2023   Bilateral cataracts 09/09/2022   Essential tremor 09/09/2022   Trigger finger, unspecified finger 09/09/2022   Anxiety 12/30/2021   OSA on CPAP 12/09/2021   Atopic dermatitis 09/24/2021   Venous stasis ulcer of right lower leg with edema of right lower leg (HCC) 09/24/2021   Urinary incontinence 05/21/2021   Oxygen  dependent 05/14/2021   Chronic kidney disease due to type 2 diabetes mellitus (HCC) 04/16/2021   Subclinical hypothyroidism 04/15/2021   Hyperlipidemia 04/02/2021   Gastroesophageal reflux disease without esophagitis 04/02/2021   Osteoarthritis 04/02/2021   Morbid obesity (HCC) 04/02/2021   Hx of BKA, left (HCC) 09/09/2020   Vitamin D  deficiency 07/30/2020   Anxiety and depression 07/30/2020   Atrial fibrillation (HCC) 07/30/2020   Obesity, Class III, BMI 40-49.9 (morbid obesity) (HCC) 01/23/2020   Tobacco abuse--2 P/day Smoker 01/23/2020   Hypoalbuminemia due to protein-calorie malnutrition 01/22/2020   Uncontrolled  type 2 diabetes mellitus with hyperglycemia, with long-term current use of insulin  (HCC) 01/22/2020   Elevated alpha fetoprotein 01/22/2020   Diabetic ulcer of left foot (HCC) 12/05/2019   Heart failure, unspecified (HCC) 03/29/2019   TIA (transient ischemic attack) 04/10/2012   COPD (chronic obstructive pulmonary disease) (HCC) 11/09/2011   Coronary artery disease involving native coronary artery of native heart with angina pectoris 02/19/2011   Dyslipidemia    Type 2 diabetes mellitus with  obesity    Primary hypertension     ONSET DATE: chronic   REFERRING DIAG: L89.61  THERAPY DIAG:  E11.621 M79.671  Rationale for Evaluation and Treatment: Rehabilitation Pt recently admitted into the hospital  Admit date: 11/09/2023 Discharge date: 11/15/23  58 year old F with PMH of COPD, CAD, IDDM-2, OSA on CPAP but not working, A-fib not on [facility], morbid obesity, left BKA, HTN, tremor, HLD, GERD and prior tobacco use presented to [facility] with worsening right heel wound with pain, swelling pus drainage for about a week or 2, and admitted to [facility] with diabetic right heel ulcer after consultation with podiatry.  MRI negative for abscess or osteomyelitis but concerning for cellulitis.  ABI with mild disease.  VVS consulted.  Angiogram without significant finding.  Podiatry recommended p.o. Augmentin  and doxycycline  for 7 more days, wound care and outpatient follow-up.  11/14/2023 Pre-operative Diagnosis: Right heel ulceration Post-operative diagnosis:  Same Surgeon:  Penne C. Sheree, MD Angiography performed with noted blockage of the iliac and femoral artery.  Not intervention was performed.  It was felt that the pt most likely needed but pt refused at this time.      Wound Therapy - 12/01/23 0001     Subjective Pt had to change dressings last night due to drainage.  Arrived with dressings intact, no rep0rts of pain currently.  Has had some burning pain.    Patient and Family Stated Goals wounds to heal    Date of Onset 07/24/23    Prior Treatments self care, hospitalization and HH    Pain Scale 0-10    Pain Score 0-No pain    Evaluation and Treatment Procedures Explained to Patient/Family Yes    Evaluation and Treatment Procedures agreed to    Wound Properties Date First Assessed: 11/10/23 Time First Assessed: 0337 Present on Original Admission: Yes Primary Wound Type: Pressure Injury Location: Hip Location Orientation: Left;Lateral Wound Description (Comments):  circular wound, minimal serous drainage. pt being treated as outpatient by home health.   Wound Image Images linked: 1    Media Information  Document Information  Photos  Left hip wound  12/01/2023 00:01  Attached To:  Outpatient Rehab on 12/01/23 with Renae Augustin PARAS, PTA  Source Information  Renae Augustin PARAS, PTA  [facility]     Site / Wound Assessment Dry;Pink    Peri-wound Assessment Intact    Drainage Amount Moderate    Treatment Cleansed;Debridement (Selective)    Dressing Type Silver hydrofiber;Gauze (Comment);Abdominal pads;Foam - Lift dressing to assess site every shift   medipore tape   Dressing Changed Changed    Dressing Status Intact;Old drainage    % Wound base Red or Granulating 60%    % Wound base Yellow/Fibrinous Exudate 40%    Margins Unattached edges (unapproximated)    Wound Properties Date First Assessed: 11/10/23 Time First Assessed: 0339 Present on Original Admission: Yes Primary Wound Type: Diabetic Ulcer Location: Heel Location Orientation: Posterior;Right   Wound Image   Media Information  Document Information  Photos  Right heel wound  12/01/2023 09:52  Attached To:  Appointment on 12/01/23 with Renae Augustin PARAS, PTA  Source Information  Renae Augustin PARAS, PTA  [facility]     Site / Wound Assessment Black;Yellow    Drainage Amount Scant    Treatments Cleansed;Moisturizing cream   Debridement, dressing change   Dressing Type Gauze (Comment)    Dressing Changed Changed    Dressing Status Dry;Intact    % Wound base Red or Granulating 5%    % Wound base Black/Eschar 95%    Wound Therapy - Clinical Statement see above    Wound Therapy - Functional Problem List unable to weight bear    Factors Delaying/Impairing Wound Healing Altered sensation;Diabetes Mellitus;Infection - systemic/local;Incontinence;Immobility;Multiple medical problems;Polypharmacy    Hydrotherapy Plan Debridement;Dressing change;Patient/family  education;Other (comment)    Wound Therapy - Frequency 2X / week    Wound Therapy - Current Recommendations PT    Wound Plan See for debridement as needed as dressing change as needed.    Dressing  Lt hip:  silver hydrofiber, cut out of ab pad and foam followed by medipore tape    Dressing Rt Heel:  vaseline to callous area, medihoney gel, use colloid if available next , un load with cut off of ab pad and 1/2 foam followed by kerlix, cotton and coban to make a football dressing            PATIENT EDUCATION: Education details: The importance of off loading as much as possible,  Keep dressing on unless it becomes painful or wet ; complete exercises  Person educated: Patient Education method: Explanation Education comprehension: verbalized understanding   HOME EXERCISE PROGRAM: Ankle pumps, toe crunches    GOALS: Goals reviewed with patient? No  SHORT TERM GOALS: Target date: 12/22/23  Wounds to be 100% granulated  Baseline: Goal status: INITIAL  2.  Rt heel pain to be decreased to no higher than a 2/10 Baseline:  Goal status: INITIAL  3. Family to be knowledgeable of the importance of off loading wound areas.  Baseline:  Goal status: INITIAL   LONG TERM GOALS: Target date: 01/19/24  Wounds to be healed.  Baseline:  Goal status: INITIAL  2.  PT to have no heel pain Baseline:  Goal status: INITIAL  3.  PT family  to verbalize the importance of using diabetic shoes at all time, with daily foot inspection.  Baseline:  Goal status: INITIAL  ASSESSMENT:  CLINICAL IMPRESSION: 12/01/23:  Improved granulation Lt hip following debridement fore removal of slough from wound bed.  Rt heel continues to be covered with adherent black slough.  Selective debridement for removal of dead skin perimeter and went around perimeter of black eschar with scalpel to loosen perimeter.  Wound center is liquifaction.  Changed to medihoney over heel, used gel though feel honeycolloid wound  be better for area though not available.  Continues with silverhydrofiber over hip with medipore tape.  Some irritation noted perimeter of hip, monitor next session and may need change in tape.  Reports of comfort at EOS.  Eval:  Patient is a 58 y.o. female who was seen today for physical therapy evaluation and treatment for Lt/ hip wound and Right heel wound.  The patient has multiple co-morbidities, (please see above),.The pt was recently admitted into the hospital for the heel wound, vascular recommended a BKA on her Rt but pt refused.  The patient also has a wound on her LT hip.  This wound has  a liquifaction noted in the center of the wound.  She has now been referred to skilled PT for wound care.  Ms. Ramer will benefit from skilled PT for debridement, off loading and appropriate dressing change to create a healing environment for both her Rt heel and her Lt hip wound.       OBJECTIVE IMPAIRMENTS: decreased mobility, impaired sensation, prosthetic dependency , obesity, pain, and decreased skin integrity.   ACTIVITY LIMITATIONS: transfers  PERSONAL FACTORS: Fitness, Past/current experiences, Time since onset of injury/illness/exacerbation, and 3+ comorbidities: DM, COPD, CAD  are also affecting patient's functional outcome.   REHAB POTENTIAL: Fair    CLINICAL DECISION MAKING: Evolving/moderate complexity  EVALUATION COMPLEXITY: High  PLAN: PT FREQUENCY: 2x/week  PT DURATION: 8 weeks  PLANNED INTERVENTIONS: 97110-Therapeutic exercises, 97535- Self Care, 02859- Manual therapy, 97598- Wound care (each additional 20 sq cm), and Patient/Family education  PLAN FOR NEXT SESSION: assess comfort level of dressing, continue with wound care.   Augustin Mclean, LPTA/CLT; WILLAIM [phone] 12/01/2023, 10:27 AM

## 2023-12-06 ENCOUNTER — Ambulatory Visit (HOSPITAL_COMMUNITY): Payer: Self-pay | Admitting: Physical Therapy

## 2023-12-06 DIAGNOSIS — M79672 Pain in left foot: Secondary | ICD-10-CM

## 2023-12-06 DIAGNOSIS — E11621 Type 2 diabetes mellitus with foot ulcer: Secondary | ICD-10-CM

## 2023-12-06 DIAGNOSIS — L89223 Pressure ulcer of left hip, stage 3: Secondary | ICD-10-CM | POA: Diagnosis not present

## 2023-12-06 NOTE — Therapy (Signed)
 OUTPATIENT PHYSICAL THERAPY Wound  TREATMENT   Patient Name: Nicole [name] MRN: [id number] DOB:1965-05-16, 58 y.o., female Today's Date: 12/06/2023   PCP: Norleen Hurst REFERRING PROVIDER: Hyacinth Honey, NP  END OF SESSION:  PT End of Session - 12/06/23 1003     Visit Number 4    Number of Visits 16    Date for Recertification  01/19/24    Authorization Type Dual complete    PT Start Time 0924    PT Stop Time 1000    PT Time Calculation (min) 36 min    Activity Tolerance Patient tolerated treatment well    Behavior During Therapy [facility] for tasks assessed/performed          Past Medical History:  Diagnosis Date   CAP (community acquired pneumonia)    Streptococcus 01/2011   COPD (chronic obstructive pulmonary disease) (HCC)    Coronary atherosclerosis of native coronary artery    a. Diagnosed [place]  2006 - DES RCA, reports followup cath 2009 at [facility] b. reported 2 stents in [place]  in 2020. c. 07/2021: cath showing patent stents along RCA and mid-LAD with scattered 20% stenosis but no obstructive disease.   Dyslipidemia    Essential hypertension, benign    Morbid obesity (HCC)    Pancreatitis    Type 2 diabetes mellitus (HCC)    Past Surgical History:  Procedure Laterality Date   ABDOMINAL AORTOGRAM N/A 11/14/2023   Procedure: ABDOMINAL AORTOGRAM;  Surgeon: Sheree Penne Bruckner, MD;  Location: [facility] INVASIVE CV LAB;  Service: Cardiovascular;  Laterality: N/A;   ABDOMINAL HERNIA REPAIR     ABDOMINAL HYSTERECTOMY     AMPUTATION Left 01/25/2020   Procedure: LEFT BELOW KNEE AMPUTATION;  Surgeon: Harden Jerona GAILS, MD;  Location: [facility] OR;  Service: Orthopedics;  Laterality: Left;   CORONARY ANGIOPLASTY WITH STENT PLACEMENT  2006   KNEE SURGERY     LEFT HEART CATH AND CORONARY ANGIOGRAPHY N/A 08/14/2021   Procedure: LEFT HEART CATH AND CORONARY ANGIOGRAPHY;  Surgeon: Burnard Debby LABOR, MD;  Location: [facility] INVASIVE CV LAB;  Service: Cardiovascular;  Laterality: N/A;   LOWER  EXTREMITY ANGIOGRAPHY Right 11/14/2023   Procedure: Lower Extremity Angiography;  Surgeon: Sheree Penne Bruckner, MD;  Location: [facility] INVASIVE CV LAB;  Service: Cardiovascular;  Laterality: Right;   NOSE SURGERY     Pilonidal cystectomy     TONSILLECTOMY     Patient Active Problem List   Diagnosis Date Noted   Diabetic ulcer of right heel associated with type 2 diabetes mellitus (HCC) 11/10/2023   Pressure ulcer of left thigh 11/10/2023   Tremor 11/10/2023   Bilateral cataracts 09/09/2022   Essential tremor 09/09/2022   Trigger finger, unspecified finger 09/09/2022   Anxiety 12/30/2021   OSA on CPAP 12/09/2021   Atopic dermatitis 09/24/2021   Venous stasis ulcer of right lower leg with edema of right lower leg (HCC) 09/24/2021   Urinary incontinence 05/21/2021   Oxygen  dependent 05/14/2021   Chronic kidney disease due to type 2 diabetes mellitus (HCC) 04/16/2021   Subclinical hypothyroidism 04/15/2021   Hyperlipidemia 04/02/2021   Gastroesophageal reflux disease without esophagitis 04/02/2021   Osteoarthritis 04/02/2021   Morbid obesity (HCC) 04/02/2021   Hx of BKA, left (HCC) 09/09/2020   Vitamin D  deficiency 07/30/2020   Anxiety and depression 07/30/2020   Atrial fibrillation (HCC) 07/30/2020   Obesity, Class III, BMI 40-49.9 (morbid obesity) (HCC) 01/23/2020   Tobacco abuse--2 P/day Smoker 01/23/2020   Hypoalbuminemia due to protein-calorie malnutrition 01/22/2020   Uncontrolled  type 2 diabetes mellitus with hyperglycemia, with long-term current use of insulin  (HCC) 01/22/2020   Elevated alpha fetoprotein 01/22/2020   Diabetic ulcer of left foot (HCC) 12/05/2019   Heart failure, unspecified (HCC) 03/29/2019   TIA (transient ischemic attack) 04/10/2012   COPD (chronic obstructive pulmonary disease) (HCC) 11/09/2011   Coronary artery disease involving native coronary artery of native heart with angina pectoris 02/19/2011   Dyslipidemia    Type 2 diabetes mellitus with  obesity    Primary hypertension     ONSET DATE: chronic   REFERRING DIAG: L89.61  THERAPY DIAG:  E11.621 M79.671  Rationale for Evaluation and Treatment: Rehabilitation Pt recently admitted into the hospital  Admit date: 11/09/2023 Discharge date: 11/15/23  58 year old F with PMH of COPD, CAD, IDDM-2, OSA on CPAP but not working, A-fib not on [facility], morbid obesity, left BKA, HTN, tremor, HLD, GERD and prior tobacco use presented to [facility] with worsening right heel wound with pain, swelling pus drainage for about a week or 2, and admitted to [facility] with diabetic right heel ulcer after consultation with podiatry.  MRI negative for abscess or osteomyelitis but concerning for cellulitis.  ABI with mild disease.  VVS consulted.  Angiogram without significant finding.  Podiatry recommended p.o. Augmentin  and doxycycline  for 7 more days, wound care and outpatient follow-up. HX:  11/14/2023 Pre-operative Diagnosis: Right heel ulceration Post-operative diagnosis:  Same Surgeon:  Penne C. Sheree, MD Angiography performed with noted blockage of the iliac and femoral artery.  Not intervention was performed.  It was felt that the pt most likely needed but pt refused at this time.     12/06/23 0001  Subjective Assessment  Subjective PT daughter states that both wounds are draining quite a bit.  Pt has appointment with primary tomorrow  Patient and Family Stated Goals wounds to heal  Date of Onset 07/24/23  Prior Treatments self care, hospitalization and HH  Pain Assessment  Pain Scale 0-10  Pain Score 5  Pain Location Heel  Pain Orientation Left  Pain Descriptors / Indicators Discomfort  Pain Intervention(s) Emotional support  Evaluation and Treatment  Evaluation and Treatment Procedures Explained to Patient/Family Yes  Evaluation and Treatment Procedures agreed to  Wound 11/10/23 0337 Pressure Injury Hip Left;Lateral  Date First Assessed/Time First Assessed: 11/10/23 0337    Present on Original Admission: Yes  Primary Wound Type: Pressure Injury  Location: Hip  Location Orientation: Left;Lateral  Wound Description (Comments): circular wound, minimal serous drainage....  Site / Wound Assessment Dry;Pink  Peri-wound Assessment Intact  Wound Length (cm) 2 cm  Wound Width (cm) 1.8 cm  Wound Surface Area (cm^2) 2.83 cm^2  Wound Depth (cm) 0.3 cm (at least)  Wound Volume (cm^3) 0.565 cm^3  Drainage Description Green  Drainage Amount Moderate  Treatment Cleansed;Debridement (Selective)  Dressing Type Silver hydrofiber;Gauze (Comment);ABD;Foam - Lift dressing to assess site every shift (medipore tape)  Dressing Changed Changed  Dressing Status Intact;Old drainage  % Wound base Red or Granulating 60%  % Wound base Yellow/Fibrinous Exudate 40%  Undermining (cm) noted  Wound 11/10/23 0339 Diabetic Ulcer Heel Posterior;Right  Date First Assessed/Time First Assessed: 11/10/23 0339   Present on Original Admission: Yes  Primary Wound Type: Diabetic Ulcer  Location: Heel  Location Orientation: Posterior;Right  Site / Wound Assessment Black;Yellow  Wound Length (cm) 5 cm  Wound Width (cm) 3 cm  Wound Surface Area (cm^2) 11.78 cm^2  Wound Depth (cm)  (unknown at this time but black eschar is  now compromised therefore must be debrided as able.)  Drainage Amount Moderate  Treatments Cleansed;Site care;Other (Comment) (Therapist used forceps, sissors and scalpel for debridement.)  Dressing Type Gauze (Comment) (debridement)  Dressing Status Dry;Intact  % Wound base Red or Granulating 5%  % Wound base Black/Eschar 95%  Wound Therapy - Assess/Plan/Recommendations  Wound Therapy - Clinical Statement see above  Wound Therapy - Functional Problem List unable to weight bear  Factors Delaying/Impairing Wound Healing Altered sensation;Diabetes Mellitus;Infection - systemic/local;Incontinence;Immobility;Multiple medical problems;Polypharmacy  Hydrotherapy Plan  Debridement;Dressing change;Patient/family education;Other (comment)  Wound Therapy - Frequency 2X / week  Wound Therapy - Current Recommendations PT  Wound Plan See for debridement as needed as dressing change as needed.  Wound Therapy  Dressing  Lt hip:  silver hydrofiber, cut out of  foam followed by ab pad 4x4 and medipore tape.  Dressing Rt Heel:  vaseline to callous area, silver hydrofiber to wound due to wound now draining , un load with cut off of ab pad and 1/2 foam followed by kerlix, and netting   PATIENT EDUCATION: Education details: The importance of off loading as much as possible,  Keep dressing on unless it becomes painful or wet ; complete exercises  Person educated: Patient Education method: Explanation Education comprehension: verbalized understanding   HOME EXERCISE PROGRAM: Ankle pumps, toe crunches    GOALS: Goals reviewed with patient? No  SHORT TERM GOALS: Target date: 12/22/23  Wounds to be 100% granulated  Baseline: Goal status: IN PROGRESS  2.  Rt heel pain to be decreased to no higher than a 2/10 Baseline:  Goal status: IN PROGRESS  3. Family to be knowledgeable of the importance of off loading wound areas.  Baseline:  Goal status: IN PROGRESS   LONG TERM GOALS: Target date: 01/19/24  Wounds to be healed.  Baseline:  Goal status: IN PROGRESS  2.  PT to have no heel pain Baseline:  Goal status: IN PROGRESS  3.  PT family  to verbalize the importance of using diabetic shoes at all time, with daily foot inspection.  Baseline:  Goal status: IN PROGRESS  ASSESSMENT:  CLINICAL IMPRESSION: 12/06/23:  Pt hip now has depth enough to pack silverhydrofiber into wound bed.   Rt heel  black eschar now has opening around the edges and has increased drainage, therefore we will begin debridement of this area as well as changing dressing to silver hydrofiber.  Due to noted green drainage from hip wound therapist recommended that family asks about an  antibiotic at the MD visit tomorrow.    OBJECTIVE IMPAIRMENTS: decreased mobility, impaired sensation, prosthetic dependency , obesity, pain, and decreased skin integrity.   ACTIVITY LIMITATIONS: transfers  PERSONAL FACTORS: Fitness, Past/current experiences, Time since onset of injury/illness/exacerbation, and 3+ comorbidities: DM, COPD, CAD  are also affecting patient's functional outcome.   REHAB POTENTIAL: Fair    CLINICAL DECISION MAKING: Evolving/moderate complexity  EVALUATION COMPLEXITY: High  PLAN: PT FREQUENCY: 2x/week  PT DURATION: 8 weeks  PLANNED INTERVENTIONS: 97110-Therapeutic exercises, 97535- Self Care, 02859- Manual therapy, 97598- Wound care (each additional 20 sq cm), and Patient/Family education  PLAN FOR NEXT SESSION:  continue with debridement and  wound care.   Montie Metro, PT CLT  [phone] 12/06/2023, 10:12 AM

## 2023-12-07 ENCOUNTER — Ambulatory Visit (HOSPITAL_COMMUNITY)
Admission: RE | Admit: 2023-12-07 | Discharge: 2023-12-07 | Disposition: A | Discharge status: Home / Self Care | Source: Ambulatory Visit | Attending: Family Medicine | Admitting: Family Medicine

## 2023-12-07 DIAGNOSIS — Z0001 Encounter for general adult medical examination with abnormal findings: Secondary | ICD-10-CM | POA: Diagnosis not present

## 2023-12-07 DIAGNOSIS — R197 Diarrhea, unspecified: Secondary | ICD-10-CM | POA: Diagnosis not present

## 2023-12-07 DIAGNOSIS — E08621 Diabetes mellitus due to underlying condition with foot ulcer: Secondary | ICD-10-CM | POA: Diagnosis not present

## 2023-12-07 DIAGNOSIS — M25552 Pain in left hip: Secondary | ICD-10-CM | POA: Diagnosis not present

## 2023-12-07 DIAGNOSIS — S71002D Unspecified open wound, left hip, subsequent encounter: Secondary | ICD-10-CM | POA: Insufficient documentation

## 2023-12-07 DIAGNOSIS — L304 Erythema intertrigo: Secondary | ICD-10-CM | POA: Diagnosis not present

## 2023-12-07 DIAGNOSIS — E876 Hypokalemia: Secondary | ICD-10-CM | POA: Diagnosis not present

## 2023-12-07 DIAGNOSIS — Z23 Encounter for immunization: Secondary | ICD-10-CM | POA: Diagnosis not present

## 2023-12-07 DIAGNOSIS — R0602 Shortness of breath: Secondary | ICD-10-CM | POA: Diagnosis not present

## 2023-12-07 DIAGNOSIS — L89220 Pressure ulcer of left hip, unstageable: Secondary | ICD-10-CM | POA: Diagnosis not present

## 2023-12-07 DIAGNOSIS — E1165 Type 2 diabetes mellitus with hyperglycemia: Secondary | ICD-10-CM | POA: Diagnosis not present

## 2023-12-07 DIAGNOSIS — E11621 Type 2 diabetes mellitus with foot ulcer: Secondary | ICD-10-CM | POA: Diagnosis not present

## 2023-12-07 DIAGNOSIS — Z0000 Encounter for general adult medical examination without abnormal findings: Secondary | ICD-10-CM | POA: Diagnosis not present

## 2023-12-07 DIAGNOSIS — L97509 Non-pressure chronic ulcer of other part of unspecified foot with unspecified severity: Secondary | ICD-10-CM | POA: Diagnosis not present

## 2023-12-07 DIAGNOSIS — M47816 Spondylosis without myelopathy or radiculopathy, lumbar region: Secondary | ICD-10-CM | POA: Diagnosis not present

## 2023-12-08 ENCOUNTER — Encounter (HOSPITAL_COMMUNITY): Admitting: Physical Therapy

## 2023-12-08 ENCOUNTER — Encounter: Payer: Self-pay | Admitting: Podiatry

## 2023-12-08 ENCOUNTER — Ambulatory Visit (INDEPENDENT_AMBULATORY_CARE_PROVIDER_SITE_OTHER): Admitting: Podiatry

## 2023-12-08 ENCOUNTER — Other Ambulatory Visit: Payer: Self-pay

## 2023-12-08 DIAGNOSIS — E11621 Type 2 diabetes mellitus with foot ulcer: Secondary | ICD-10-CM | POA: Diagnosis not present

## 2023-12-08 DIAGNOSIS — Z89512 Acquired absence of left leg below knee: Secondary | ICD-10-CM | POA: Diagnosis not present

## 2023-12-08 DIAGNOSIS — L97412 Non-pressure chronic ulcer of right heel and midfoot with fat layer exposed: Secondary | ICD-10-CM | POA: Diagnosis not present

## 2023-12-08 NOTE — Progress Notes (Signed)
 Subjective:  Patient ID: Nicole [name], female    DOB: 11-04-1965,  MRN: [id number]  Chief Complaint  Patient presents with   Wound Check    F/U right foot heel wound. Discolored black 5 pain. Drainage from wound. Pt. Sees wound care facility in outpatient rehab. IDDM A1C 13.2. Wearing surgical shoe.     58 y.o. female presents for follow-up on right heel ulceration.   Has been getting 2x weekly dressing change at oupatient rehab center. Wearing surgical shoe. Per family with patient possibly some concern for drainage/necrosis of the ulcer.   Past Medical History:  Diagnosis Date   CAP (community acquired pneumonia)    Streptococcus 01/2011   COPD (chronic obstructive pulmonary disease) (HCC)    Coronary atherosclerosis of native coronary artery    a. Diagnosed [place]  2006 - DES RCA, reports followup cath 2009 at [facility] b. reported 2 stents in [place]  in 2020. c. 07/2021: cath showing patent stents along RCA and mid-LAD with scattered 20% stenosis but no obstructive disease.   Dyslipidemia    Essential hypertension, benign    Morbid obesity (HCC)    Pancreatitis    Type 2 diabetes mellitus (HCC)     Allergies  Allergen Reactions   Aspirin  Anaphylaxis and Other (See Comments)    Throat closing    Bee Venom Anaphylaxis   Pineapple Anaphylaxis   Definity  [Perflutren  Lipid Microsphere]     Muscle Aches   E-Mycin [Erythromycin Base] Nausea And Vomiting    ROS: Negative except as per HPI above  Objective:  General: AAO x3, NAD  Dermatological: Right heel with necrotic ulceration at the plantar medial aspect.  No flaking skin or erythema today.  Healthy tissue underlying.  The necrotic eschar is firm no fluctuance.  Overall much improved from prior    Vascular: DP and PT pulses nonpalpable, does have cap refill intact to digits  Neruologic: Grossly diminished to light touch to B heel  Musculoskeletal: Prior BKA left lower extremity  Gait: Unassisted, Nonantalgic.     Radiographs:  MRI right foot with and without contrast 11/10/2023: 1. Soft tissue ulceration/wound at the medial heel with surrounding soft tissue edema, enhancement and cutaneous thickening, most pronounced along the anteromedial margin. These findings are compatible with cellulitis. No loculated fluid collection. 2. No evidence of osteomyelitis. Assessment:   1. Diabetic ulcer of right heel associated with type 2 diabetes mellitus, with fat layer exposed (HCC)   2. History of amputation of leg through tibia and fibula, left (HCC)       Plan:  Patient was evaluated and treated and all questions answered.  # Right heel ulceration secondary to diabetes and PAD - At this time the ulceration appears stable and much improved without evidence of infection. - Recommend we continue local wound care as they have been doing.  Recommend continue 2-3x weekly dressing change with silver alginate to the wound cover with 4x4 gauze, kerlix and ace wrap.  -No debridement needed today - Status post Augmentin  and doxycycline  on discharge. - Weightbearing as tolerated in postop shoe for transfers short distances.  Prevalon boot use while sleeping - Remains at risk for limb loss however at this time appears to be with continued improved int he wound  - follow up in 3 weeks for wound check         Nicole [name], DPM [facility] / [facility]

## 2023-12-08 NOTE — Transitions of Care (Post Inpatient/ED Visit) (Signed)
 Transition of Care week 4  Visit Note  12/08/2023  Name: Nicole [name] MRN: [id number]          DOB: 1965-06-15  Situation: Patient enrolled in [facility] 30-day program. Visit completed with daughter [name] by telephone.   Background:   Initial Transition Care Management Follow-up Telephone Call Discharge Date and Diagnosis: 11/15/23, Diabetic ulcer of right heel associated with type 2 diabetes mellitus   Past Medical History:  Diagnosis Date   CAP (community acquired pneumonia)    Streptococcus 01/2011   COPD (chronic obstructive pulmonary disease) (HCC)    Coronary atherosclerosis of native coronary artery    a. Diagnosed [place]  2006 - DES RCA, reports followup cath 2009 at [facility] b. reported 2 stents in [place]  in 2020. c. 07/2021: cath showing patent stents along RCA and mid-LAD with scattered 20% stenosis but no obstructive disease.   Dyslipidemia    Essential hypertension, benign    Morbid obesity (HCC)    Pancreatitis    Type 2 diabetes mellitus (HCC)     Assessment: Patient Reported Symptoms: Cognitive Cognitive Status: Alert and oriented to person, place, and time, Normal speech and language skills      Neurological Neurological Review of Symptoms: No symptoms reported    HEENT HEENT Symptoms Reported: No symptoms reported      Cardiovascular Cardiovascular Symptoms Reported: No symptoms reported    Respiratory Respiratory Symptoms Reported: No symptoms reported    Endocrine Endocrine Symptoms Reported: No symptoms reported Is patient diabetic?: Yes Is patient checking blood sugars at home?: Yes List most recent blood sugar readings, include date and time of day: Patient sugars are up in the eveing but lower in the AM.  Check at lunch time 150.  Discussed importance of sugar control. Patient to start back on Ozempic . Endocrine Self-Management Outcome: 4 (good)  Gastrointestinal Gastrointestinal Symptoms Reported: No symptoms reported      Genitourinary  Genitourinary Symptoms Reported: No symptoms reported    Integumentary Integumentary Symptoms Reported: Wound Additional Integumentary Details: Left hip with increased drainage per daughter . Patient saw PCP yesterday.  x-ray ordered and results are pending.  Daughter denies fever, chills, or increased confusion.  Right heel wound improving.  Seen by podiatry today.  Daughter states MD pleased with wound progress. Skin Management Strategies: Routine screening, Medication therapy Skin Self-Management Outcome: 3 (uncertain)  Musculoskeletal Musculoskelatal Symptoms Reviewed: Weakness Additional Musculoskeletal Details: Left BKA with prothesis.  Family asssists as needed and patient uses walker.  Home health stopped due to outpatient rehab visits for wound care. Musculoskeletal Management Strategies: Exercise Musculoskeletal Self-Management Outcome: 4 (good)      Psychosocial Psychosocial Symptoms Reported: Not assessed Additional Psychological Details: daughter [name] Completes assessment         There were no vitals filed for this visit.  Medications Reviewed Today     Reviewed by Shaune Malacara, RN (Case Manager) on 12/08/23 at 1416  Med List Status: <None>   Medication Order Taking? Sig Documenting Provider Last Dose Status Informant  Accu-Chek Softclix Lancets lancets [id number]  Use as instructed to monitor glucose 4 times daily- use as backup to [employer], [place] PARAS, NP  Active Self, Pharmacy Records, Child, Multiple Informants  albuterol  (VENTOLIN  HFA) 108 (90 Base) MCG/ACT inhaler [id number]  Inhale 2 puffs into the lungs every 6 (six) hours as needed for wheezing. Shortness of breath Elnor Lauraine BRAVO, NP  Active Self, Pharmacy Records, Child, Multiple Informants  busPIRone  (BUSPAR ) 5 MG tablet [id number]  Take 5 mg  by mouth 2 (two) times daily. [provider]  Active Self, Pharmacy Records, Child, Multiple Informants  Cholecalciferol  1.25 MG (50000 UT) capsule [id number]   Take 50,000 Units by mouth once a week. [provider]  Active Self, Pharmacy Records, Child, Multiple Informants  clopidogrel  (PLAVIX ) 75 MG tablet [id number]  Take 1 tablet by mouth once daily Okey Vina GAILS, MD  Active   EASY TOUCH INSULIN  SYRINGE 31G X 5/16 1 ML MISC [id number]   [provider]  Active Self, Pharmacy Records, Child, Multiple Informants  EPINEPHrine  0.3 mg/0.3 mL IJ SOAJ injection [id number]  Inject 0.3 mg into the muscle as needed for anaphylaxis. Elnor Lauraine BRAVO, NP  Active Self, Pharmacy Records, Child, Multiple Informants           Med Note [place], DAWN S   Thu Nov 10, 2023 10:13 AM) Need refill  gabapentin  (NEURONTIN ) 300 MG capsule [id number]  Take 1 capsule (300 mg total) by mouth at bedtime. Gonfa, Taye T, MD  Active   glucose blood (ACCU-CHEK GUIDE TEST) test strip [id number]  Use as instructed to monitor glucose 4 times daily- use as back up for Dexcom [place]ardon, Benton PARAS, NP  Active Self, Pharmacy Records, Child, Multiple Informants  Insulin  Pen Needle (B-D ULTRAFINE III SHORT PEN) 31G X 8 MM MISC [id number]  USE AS DIRECTED Lorren Greig PARAS, NP  Active Self, Pharmacy Records, Child, Multiple Informants  insulin  regular human CONCENTRATED (HUMULIN  R U-500 KWIKPEN) 500 UNIT/ML KwikPen [id number]  Inject 90 Units into the skin 3 (three) times daily with meals. Therisa Benton PARAS, NP  Active Self, Pharmacy Records, Child, Multiple Informants  isosorbide  mononitrate (IMDUR ) 30 MG 24 hr tablet [id number]  Take 1 tablet (30 mg total) by mouth daily. Strader, [place] M, PA-C  Active   levocetirizine (XYZAL ) 5 MG tablet [id number]  Take 5 mg by mouth daily. [provider]  Active Self, Pharmacy Records, Child, Multiple Informants  metoprolol  tartrate (LOPRESSOR ) 25 MG tablet [id number]  Take 0.5 tablets (12.5 mg total) by mouth 2 (two) times daily. Strader, [place] M, PA-C  Active   nitroGLYCERIN  (NITROSTAT ) 0.4 MG SL tablet [id number]  DISSOLVE ONE TABLET  UNDER THE TONGUE EVERY 5 MINUTES AS NEEDED FOR CHEST PAIN.  DO NOT EXCEED A TOTAL OF 3 DOSES IN 15 MINUTES Okey Vina GAILS, MD  Active Self, Pharmacy Records, Child, Multiple Informants  nystatin  powder [id number]  Apply 1 Application topically 3 (three) times daily. Geroldine Berg, MD  Active Self, Pharmacy Records, Child, Multiple Informants  pantoprazole  (PROTONIX ) 40 MG tablet [id number]  Take 1 tablet (40 mg total) by mouth daily. Elnor Lauraine BRAVO, NP  Active Self, Pharmacy Records, Child, Multiple Informants  potassium chloride  SA (KLOR-CON  M20) 20 MEQ tablet [id number]  TAKE 1 TABLET BY MOUTH ONCE DAILY. PATIENT IS OVERDUE FOR A FOLLOW-UP APPOINTMENT. PLEASE SCHEDULE AN APPOINTMENT FOR ADDITIONAL REFILLS.  Patient not taking: Reported on 12/08/2023   Okey Vina GAILS, MD  Active Self, Pharmacy Records, Child, Multiple Informants  rosuvastatin  (CRESTOR ) 40 MG tablet [id number]  Take 40 mg by mouth at bedtime. [provider]  Active Self, Pharmacy Records, Child, Multiple Informants  Semaglutide , 2 MG/DOSE, 8 MG/3ML SOPN [id number]  Inject 2 mg as directed once a week.  Patient not taking: Reported on 12/08/2023   Therisa Benton PARAS, NP  Active Self, Pharmacy Records, Child, Multiple Informants  senna-docusate (SENOKOT-S) 8.6-50 MG tablet [id number]  Take 1-2 tablets by mouth 2 (two) times daily between meals as needed  for mild constipation or moderate constipation. Gonfa, Taye T, MD  Active   torsemide  (DEMADEX ) 20 MG tablet [id number]  Take 2 tablets by mouth once daily Okey Vina GAILS, MD  Active Self, Pharmacy Records, Child, Multiple Informants  traMADol  (ULTRAM ) 50 MG tablet [id number]  Take 50 mg by mouth every 8 (eight) hours. [provider]  Active Self, Pharmacy Records, Child, Multiple Informants  VRAYLAR  3 MG capsule [id number] Yes Take 3 mg by mouth daily.  Patient taking differently: Take 1.5 mg by mouth daily.   [provider]  Active Self, Pharmacy Records, Child,  Multiple Informants            Goals Addressed             This Visit's Progress    VBCI Transitions of Care (TOC) Care Plan       Problems:  Recent Hospitalization for treatment of DMII and Right Heel Infection Knowledge Deficit Related to Right heel wound and left hip wound  Goal:  Over the next 30 days, the patient will not experience hospital readmission  Interventions:  Transitions of Care: Doctor Visits  - discussed the importance of doctor visits Discussed outpatient therapy for wound care.  Return visit scheduled 12/15/23     Diabetes Interventions: Assessed patient's understanding of A1c goal: <8% Reviewed medications with patient and discussed importance of medication adherence Discussed plans with patient for ongoing care management follow up and provided patient with direct contact information for care management team Reviewed scheduled/upcoming provider appointments including:  wound clinic Lab Results  Component Value Date   HGBA1C 13.2 (H) 11/10/2023   Diabetes Management Discussed:  Medication adherence Reiterated importance of limiting carbs such as rice, potatoes, breads, and pastas.  Reinforced importance of sugar control to promote wound healing. Discussed sugar levels. Most recent sugar 150.  Wound Management Discussed: Reiterated: Monitor for swelling, redness, pain, pus, and fever, Keep wound clean and dry, Notify physician immediately for any changes.  Off load pressure to wound areas as much as possible.    Patient Self Care Activities:  Attend all scheduled provider appointments Call pharmacy for medication refills 3-7 days in advance of running out of medications Call provider office for new concerns or questions  Notify RN Care Manager of TOC call rescheduling needs Participate in Transition of Care Program/Attend TOC scheduled calls Take medications as prescribed   Monitor for swelling, redness, pain, pus, and fever Keep wound clean  and dry.   Notify physician immediately for any changes. Monitor sugars as ordered by physician Limit carbohydrates, sweets and sugary drinks.  Plan:  The patient has been provided with contact information for the care management team and has been advised to call with any health related questions or concerns.         Recommendation:   Continue Current Plan of Care  Follow Up Plan:   Telephone follow-up in 1 week  Sephira Zellman J. Tran Arzuaga RN, MSN [facility], [facility] RN Care Manager Direct Dial: [phone]  Fax: [phone] Website: [email or web address]

## 2023-12-08 NOTE — Patient Instructions (Signed)
 Visit Information  Thank you for taking time to visit with me today. Please don't hesitate to contact me if I can be of assistance to you before our next scheduled telephone appointment.  Our next appointment is by telephone on 12/15/23 at 1200 pm  Following is a copy of your care plan:   Goals Addressed             This Visit's Progress    VBCI Transitions of Care (TOC) Care Plan       Problems:  Recent Hospitalization for treatment of DMII and Right Heel Infection Knowledge Deficit Related to Right heel wound and left hip wound  Goal:  Over the next 30 days, the patient will not experience hospital readmission  Interventions:  Transitions of Care: Doctor Visits  - discussed the importance of doctor visits Discussed outpatient therapy for wound care.  Return visit scheduled 12/15/23     Diabetes Interventions: Assessed patient's understanding of A1c goal: <8% Reviewed medications with patient and discussed importance of medication adherence Discussed plans with patient for ongoing care management follow up and provided patient with direct contact information for care management team Reviewed scheduled/upcoming provider appointments including:  wound clinic Lab Results  Component Value Date   HGBA1C 13.2 (H) 11/10/2023   Diabetes Management Discussed:  Medication adherence Reiterated importance of limiting carbs such as rice, potatoes, breads, and pastas.  Reinforced importance of sugar control to promote wound healing. Discussed sugar levels. Most recent sugar 150.  Wound Management Discussed: Reiterated: Monitor for swelling, redness, pain, pus, and fever, Keep wound clean and dry, Notify physician immediately for any changes.  Off load pressure to wound areas as much as possible.    Patient Self Care Activities:  Attend all scheduled provider appointments Call pharmacy for medication refills 3-7 days in advance of running out of medications Call provider office for  new concerns or questions  Notify RN Care Manager of TOC call rescheduling needs Participate in Transition of Care Program/Attend TOC scheduled calls Take medications as prescribed   Monitor for swelling, redness, pain, pus, and fever Keep wound clean and dry.   Notify physician immediately for any changes. Monitor sugars as ordered by physician Limit carbohydrates, sweets and sugary drinks.  Plan:  The patient has been provided with contact information for the care management team and has been advised to call with any health related questions or concerns.         Patient verbalizes understanding of instructions and care plan provided today and agrees to view in MyChart. Active MyChart status and patient understanding of how to access instructions and care plan via MyChart confirmed with patient.     The patient has been provided with contact information for the care management team and has been advised to call with any health related questions or concerns.   Please call the care guide team at [phone] if you need to cancel or reschedule your appointment.   Please call the Suicide and Crisis Lifeline: 988 if you are experiencing a Mental Health or Behavioral Health Crisis or need someone to talk to.  Jalexia Lalli J. Cherika Jessie RN, MSN [facility], [facility] RN Care Manager Direct Dial: [phone]  Fax: [phone] Website: [email or web address]

## 2023-12-13 ENCOUNTER — Ambulatory Visit (HOSPITAL_COMMUNITY): Payer: Self-pay | Admitting: Physical Therapy

## 2023-12-15 ENCOUNTER — Other Ambulatory Visit: Payer: Self-pay

## 2023-12-15 ENCOUNTER — Ambulatory Visit (HOSPITAL_COMMUNITY): Payer: Self-pay | Admitting: Physical Therapy

## 2023-12-15 NOTE — Transitions of Care (Post Inpatient/ED Visit) (Signed)
 Transition of Care Week 5  Visit Note  12/15/2023  Name: Nicole [name] MRN: [id number]          DOB: [birth year]  Situation: Patient enrolled in [facility] 30-day program. Visit completed with daughter [name] by telephone.   Background:   Initial Transition Care Management Follow-up Telephone Call Discharge Date and Diagnosis: 11/15/23, Diabetic ulcer of right heel associated with type 2 diabetes mellitus   Past Medical History:  Diagnosis Date   CAP (community acquired pneumonia)    Streptococcus 01/2011   COPD (chronic obstructive pulmonary disease) (HCC)    Coronary atherosclerosis of native coronary artery    a. Diagnosed [place]  2006 - DES RCA, reports followup cath 2009 at [facility] b. reported 2 stents in [place]  in 2020. c. 07/2021: cath showing patent stents along RCA and mid-LAD with scattered 20% stenosis but no obstructive disease.   Dyslipidemia    Essential hypertension, benign    Morbid obesity (HCC)    Pancreatitis    Type 2 diabetes mellitus (HCC)     Assessment: Patient Reported Symptoms: Cognitive Cognitive Status: Alert and oriented to person, place, and time, Normal speech and language skills (per daughter)      Neurological Neurological Review of Symptoms: No symptoms reported    HEENT HEENT Symptoms Reported: No symptoms reported      Cardiovascular Cardiovascular Symptoms Reported: No symptoms reported    Respiratory Respiratory Symptoms Reported: No symptoms reported    Endocrine Endocrine Symptoms Reported: No symptoms reported Is patient diabetic?: Yes Is patient checking blood sugars at home?: Yes List most recent blood sugar readings, include date and time of day: Ozempic  restarted.  Daughter harlene does not have most recent sugars but says control has been much better. Endocrine Self-Management Outcome: 4 (good)  Gastrointestinal Gastrointestinal Symptoms Reported: No symptoms reported      Genitourinary Genitourinary Symptoms Reported: No  symptoms reported    Integumentary Other Integumentary Symptoms: Left BKA, has prothesis Additional Integumentary Details: Left hip with some drainage per daughter . [name] states PCP states no infection seen on xray,  Daughter denies fever, chills, or increased confusion. Right heel wound continues to improve . Skin Management Strategies: Routine screening, Medication therapy Skin Self-Management Outcome: 3 (uncertain)  Musculoskeletal Musculoskelatal Symptoms Reviewed: Weakness Additional Musculoskeletal Details: Left BKA with prothesis. Family asssists as needed and patient uses walker. Musculoskeletal Management Strategies: Exercise Musculoskeletal Self-Management Outcome: 4 (good)      Psychosocial Psychosocial Symptoms Reported: Not assessed Additional Psychological Details: daughter [name] completes the assessment         There were no vitals filed for this visit.  Medications Reviewed Today     Reviewed by Charlisha Market, RN (Case Manager) on 12/15/23 at 1219  Med List Status: <None>   Medication Order Taking? Sig Documenting Provider Last Dose Status Informant  Accu-Chek Softclix Lancets lancets [id number] Yes Use as instructed to monitor glucose 4 times daily- use as backup to [employer], [place]enton PARAS, NP  Active Self, Pharmacy Records, Child, Multiple Informants  albuterol  (VENTOLIN  HFA) 108 (90 Base) MCG/ACT inhaler [id number] Yes Inhale 2 puffs into the lungs every 6 (six) hours as needed for wheezing. Shortness of breath Elnor Lauraine BRAVO, NP  Active Self, Pharmacy Records, Child, Multiple Informants  busPIRone  (BUSPAR ) 5 MG tablet [id number] Yes Take 5 mg by mouth 2 (two) times daily. [provider]  Active Self, Pharmacy Records, Child, Multiple Informants  Cholecalciferol  1.25 MG (50000 UT) capsule [id number] Yes Take 50,000 Units by mouth once  a week. [provider]  Active Self, Pharmacy Records, Child, Multiple Informants  clopidogrel  (PLAVIX ) 75 MG tablet  [id number] Yes Take 1 tablet by mouth once daily Okey Vina GAILS, MD  Active   EASY TOUCH INSULIN  SYRINGE 31G X 5/16 1 ML MISC [id number] Yes  [provider]  Active Self, Pharmacy Records, Child, Multiple Informants  EPINEPHrine  0.3 mg/0.3 mL IJ SOAJ injection [id number] Yes Inject 0.3 mg into the muscle as needed for anaphylaxis. Elnor Lauraine BRAVO, NP  Active Self, Pharmacy Records, Child, Multiple Informants           Med Note DARILYN, DAWN S   Thu Nov 10, 2023 10:13 AM) Need refill  gabapentin  (NEURONTIN ) 300 MG capsule [id number] Yes Take 1 capsule (300 mg total) by mouth at bedtime. Gonfa, Taye T, MD  Active   glucose blood (ACCU-CHEK GUIDE TEST) test strip [id number] Yes Use as instructed to monitor glucose 4 times daily- use as back up for Dexcom Reardon, Benton PARAS, NP  Active Self, Pharmacy Records, Child, Multiple Informants  Insulin  Pen Needle (B-D ULTRAFINE III SHORT PEN) 31G X 8 MM MISC [id number] Yes USE AS DIRECTED Lorren Greig PARAS, NP  Active Self, Pharmacy Records, Child, Multiple Informants  insulin  regular human CONCENTRATED (HUMULIN  R U-500 KWIKPEN) 500 UNIT/ML KwikPen [id number] Yes Inject 90 Units into the skin 3 (three) times daily with meals. Therisa Benton PARAS, NP  Active Self, Pharmacy Records, Child, Multiple Informants  isosorbide  mononitrate (IMDUR ) 30 MG 24 hr tablet [id number] Yes Take 1 tablet (30 mg total) by mouth daily. Strader, [place] M, PA-C  Active   levocetirizine (XYZAL ) 5 MG tablet [id number] Yes Take 5 mg by mouth daily. [provider]  Active Self, Pharmacy Records, Child, Multiple Informants  metoprolol  tartrate (LOPRESSOR ) 25 MG tablet [id number] Yes Take 0.5 tablets (12.5 mg total) by mouth 2 (two) times daily. Strader, [place] M, PA-C  Active   nitroGLYCERIN  (NITROSTAT ) 0.4 MG SL tablet [id number] Yes DISSOLVE ONE TABLET UNDER THE TONGUE EVERY 5 MINUTES AS NEEDED FOR CHEST PAIN.  DO NOT EXCEED A TOTAL OF 3 DOSES IN 15 MINUTES Okey Vina GAILS, MD   Active Self, Pharmacy Records, Child, Multiple Informants  nystatin  powder [id number] Yes Apply 1 Application topically 3 (three) times daily. Geroldine Berg, MD  Active Self, Pharmacy Records, Child, Multiple Informants  pantoprazole  (PROTONIX ) 40 MG tablet [id number]  Take 1 tablet (40 mg total) by mouth daily. Elnor Lauraine BRAVO, NP  Active Self, Pharmacy Records, Child, Multiple Informants  potassium chloride  SA (KLOR-CON  M20) 20 MEQ tablet [id number]  TAKE 1 TABLET BY MOUTH ONCE DAILY. PATIENT IS OVERDUE FOR A FOLLOW-UP APPOINTMENT. PLEASE SCHEDULE AN APPOINTMENT FOR ADDITIONAL REFILLS.  Patient not taking: Reported on 12/15/2023   Okey Vina GAILS, MD  Active Self, Pharmacy Records, Child, Multiple Informants  rosuvastatin  (CRESTOR ) 40 MG tablet [id number] Yes Take 40 mg by mouth at bedtime. [provider]  Active Self, Pharmacy Records, Child, Multiple Informants  Semaglutide , 2 MG/DOSE, 8 MG/3ML SOPN [id number] Yes Inject 2 mg as directed once a week. Therisa Benton PARAS, NP  Active Self, Pharmacy Records, Child, Multiple Informants  senna-docusate (SENOKOT-S) 8.6-50 MG tablet [id number] Yes Take 1-2 tablets by mouth 2 (two) times daily between meals as needed for mild constipation or moderate constipation. Gonfa, Taye T, MD  Active   torsemide  (DEMADEX ) 20 MG tablet [id number] Yes Take 2 tablets by mouth once daily Okey Vina GAILS, MD  Active Self, Pharmacy Records, Child, Multiple Informants  traMADol  (ULTRAM ) 50 MG tablet [id number] Yes Take 50 mg by mouth every 8 (eight) hours. [provider]  Active Self, Pharmacy Records, Child, Multiple Informants  VRAYLAR  3 MG capsule [id number] Yes Take 3 mg by mouth daily.  Patient taking differently: Take 1.5 mg by mouth daily.   [provider]  Active Self, Pharmacy Records, Child, Multiple Informants            Goals Addressed             This Visit's Progress    COMPLETED: VBCI Transitions of Care (TOC) Care Plan        Problems:  Recent Hospitalization for treatment of DMII and Right Heel Infection Knowledge Deficit Related to Right heel wound and left hip wound  Goal:  Over the next 30 days, the patient will not experience hospital readmission  Interventions:  Transitions of Care: Doctor Visits  - discussed the importance of doctor visits Discussed outpatient therapy for wound care.  Patient has not gone this week due to not feeling well.  Dressings being done by family.  No signs of infection     Diabetes Interventions: Assessed patient's understanding of A1c goal: <8% Reviewed medications with patient and discussed importance of medication adherence Discussed plans with patient for ongoing care management follow up and provided patient with direct contact information for care management team Reviewed scheduled/upcoming provider appointments including:  wound clinic Lab Results  Component Value Date   HGBA1C 13.2 (H) 11/10/2023   Diabetes Management Discussed:  Medication adherence Reiterated importance of limiting carbs such as rice, potatoes, breads, and pastas.  Reinforced importance of sugar control to promote wound healing. Discussed sugar levels. Daughter states levels have been good but she does not have reading from this morning.  Ozempic  restarted yesterday.  Wound Management Discussed: Reiterated: Monitor for swelling, redness, pain, pus, and fever, Keep wound clean and dry, Notify physician immediately for any changes.  Off load pressure to wound areas as much as possible.    Patient Self Care Activities:  Attend all scheduled provider appointments Call pharmacy for medication refills 3-7 days in advance of running out of medications Call provider office for new concerns or questions  Notify RN Care Manager of TOC call rescheduling needs Participate in Transition of Care Program/Attend TOC scheduled calls Take medications as prescribed   Monitor for swelling, redness, pain, pus,  and fever Keep wound clean and dry.   Notify physician immediately for any changes. Monitor sugars as ordered by physician Limit carbohydrates, sweets and sugary drinks.  Plan:  The patient has been provided with contact information for the care management team and has been advised to call with any health related questions or concerns.         Recommendation:   Continue Current Plan of Care Follow up with physician for questions or concerns.  Advised on CCM program.  Daughter declined  Follow Up Plan:   Closing From:  Transitions of Care Program  Priti Consoli DOROTHA Seeds RN, MSN [place]  [facility], [facility] RN Care Manager Direct Dial: [phone]  Fax: [phone] Website: [email or web address]

## 2023-12-15 NOTE — Patient Instructions (Signed)
 Visit Information  Thank you for taking time to visit with me today.   Please follow up with physician for questions or concerns  .   Following is a copy of your care plan:   Goals Addressed             This Visit's Progress    COMPLETED: VBCI Transitions of Care (TOC) Care Plan       Problems:  Recent Hospitalization for treatment of DMII and Right Heel Infection Knowledge Deficit Related to Right heel wound and left hip wound  Goal:  Over the next 30 days, the patient will not experience hospital readmission  Interventions:  Transitions of Care: Doctor Visits  - discussed the importance of doctor visits Discussed outpatient therapy for wound care.  Patient has not gone this week due to not feeling well.  Dressings being done by family.  No signs of infection     Diabetes Interventions: Assessed patient's understanding of A1c goal: <8% Reviewed medications with patient and discussed importance of medication adherence Discussed plans with patient for ongoing care management follow up and provided patient with direct contact information for care management team Reviewed scheduled/upcoming provider appointments including:  wound clinic Lab Results  Component Value Date   HGBA1C 13.2 (H) 11/10/2023   Diabetes Management Discussed:  Medication adherence Reiterated importance of limiting carbs such as rice, potatoes, breads, and pastas.  Reinforced importance of sugar control to promote wound healing. Discussed sugar levels. Daughter states levels have been good but she does not have reading from this morning.  Ozempic  restarted yesterday.  Wound Management Discussed: Reiterated: Monitor for swelling, redness, pain, pus, and fever, Keep wound clean and dry, Notify physician immediately for any changes.  Off load pressure to wound areas as much as possible.    Patient Self Care Activities:  Attend all scheduled provider appointments Call pharmacy for medication refills 3-7  days in advance of running out of medications Call provider office for new concerns or questions  Notify RN Care Manager of TOC call rescheduling needs Participate in Transition of Care Program/Attend TOC scheduled calls Take medications as prescribed   Monitor for swelling, redness, pain, pus, and fever Keep wound clean and dry.   Notify physician immediately for any changes. Monitor sugars as ordered by physician Limit carbohydrates, sweets and sugary drinks.  Plan:  The patient has been provided with contact information for the care management team and has been advised to call with any health related questions or concerns.         Patient verbalizes understanding of instructions and care plan provided today and agrees to view in MyChart. Active MyChart status and patient understanding of how to access instructions and care plan via MyChart confirmed with patient.     The patient has been provided with contact information for the care management team and has been advised to call with any health related questions or concerns.   Please call the care guide team at [phone] if you need to cancel or reschedule your appointment.   Please call the Suicide and Crisis Lifeline: 988 if you are experiencing a Mental Health or Behavioral Health Crisis or need someone to talk to.  Cloyde Oregel J. Amitai Delaughter RN, MSN [facility], [facility] RN Care Manager Direct Dial: [phone]  Fax: [phone] Website: [email or web address]

## 2023-12-18 NOTE — Progress Notes (Deleted)
 Chief Complaint: Leaking urine  History of Present Illness:  Nicole [name] is a 58 y.o. female who is seen in consultation from Shona Norleen PEDLAR, MD for evaluation of ***.   Past Medical History:  Past Medical History:  Diagnosis Date   CAP (community acquired pneumonia)    Streptococcus 01/2011   COPD (chronic obstructive pulmonary disease) (HCC)    Coronary atherosclerosis of native coronary artery    a. Diagnosed [place]  2006 - DES RCA, reports followup cath 2009 at [facility] b. reported 2 stents in [place]  in 2020. c. 07/2021: cath showing patent stents along RCA and mid-LAD with scattered 20% stenosis but no obstructive disease.   Dyslipidemia    Essential hypertension, benign    Morbid obesity (HCC)    Pancreatitis    Type 2 diabetes mellitus (HCC)     Past Surgical History:  Past Surgical History:  Procedure Laterality Date   ABDOMINAL AORTOGRAM N/A 11/14/2023   Procedure: ABDOMINAL AORTOGRAM;  Surgeon: Sheree Penne Bruckner, MD;  Location: [facility] INVASIVE CV LAB;  Service: Cardiovascular;  Laterality: N/A;   ABDOMINAL HERNIA REPAIR     ABDOMINAL HYSTERECTOMY     AMPUTATION Left 01/25/2020   Procedure: LEFT BELOW KNEE AMPUTATION;  Surgeon: Harden Jerona GAILS, MD;  Location[facility] OR;  Service: Orthopedics;  Laterality: Left;   CORONARY ANGIOPLASTY WITH STENT PLACEMENT  2006   KNEE SURGERY     LEFT HEART CATH AND CORONARY ANGIOGRAPHY N/A 08/14/2021   Procedure: LEFT HEART CATH AND CORONARY ANGIOGRAPHY;  Surgeon: Burnard Debby LABOR, MD;  Location: [facility] INVASIVE CV LAB;  Service: Cardiovascular;  Laterality: N/A;   LOWER EXTREMITY ANGIOGRAPHY Right 11/14/2023   Procedure: Lower Extremity Angiography;  Surgeon: Sheree Penne Bruckner, MD;  Location: [facility] INVASIVE CV LAB;  Service: Cardiovascular;  Laterality: Right;   NOSE SURGERY     Pilonidal cystectomy     TONSILLECTOMY      Allergies:  Allergies  Allergen Reactions   Aspirin  Anaphylaxis and Other (See Comments)    Throat  closing    Bee Venom Anaphylaxis   Pineapple Anaphylaxis   Definity  [Perflutren  Lipid Microsphere]     Muscle Aches   E-Mycin [Erythromycin Base] Nausea And Vomiting    Family History:  Family History  Problem Relation Age of Onset   Diabetes Mother    Heart failure Mother    Hypertension Mother    Kidney failure Mother    Ovarian cancer Mother    Asthma Son     Social History:  Social History   Tobacco Use   Smoking status: Former    Current packs/day: 0.00    Average packs/day: 0.5 packs/day for 20.0 years (10.0 ttl pk-yrs)    Types: Cigarettes    Start date: 01/22/2000    Quit date: 01/22/2020    Years since quitting: 3.9   Smokeless tobacco: Never  Vaping Use   Vaping status: Never Used  Substance Use Topics   Alcohol use: No   Drug use: No    Review of symptoms:  Constitutional:  Negative for unexplained weight loss, night sweats, fever, chills ENT:  Negative for nose bleeds, sinus pain, painful swallowing CV:  Negative for chest pain, shortness of breath, exercise intolerance, palpitations, loss of consciousness Resp:  Negative for cough, wheezing, shortness of breath GI:  Negative for nausea, vomiting, diarrhea, bloody stools GU:  Positives noted in HPI; otherwise negative for gross hematuria, dysuria, urinary incontinence Neuro:  Negative for seizures, poor balance, limb weakness, slurred  speech Psych:  Negative for lack of energy, depression, anxiety Endocrine:  Negative for polydipsia, polyuria, symptoms of hypoglycemia (dizziness, hunger, sweating) Hematologic:  Negative for anemia, purpura, petechia, prolonged or excessive bleeding, use of anticoagulants  Allergic:  Negative for difficulty breathing or choking as a result of exposure to anything; no shellfish allergy; no allergic response (rash/itch) to materials, foods  Physical exam: There were no vitals taken for this visit. GENERAL APPEARANCE:  Well appearing, well developed, well nourished,  NAD HEENT: Atraumatic, Normocephalic, oropharynx clear. NECK: Supple without lymphadenopathy or thyromegaly. LUNGS: Clear to auscultation bilaterally. HEART: Regular Rate and Rhythm without murmurs, gallops, or rubs. ABDOMEN: Soft, non-tender, No Masses. EXTREMITIES: Moves all extremities well.  Without clubbing, cyanosis, or edema. NEUROLOGIC:  Alert and oriented x 3, normal gait, CN II-XII grossly intact.  MENTAL STATUS:  Appropriate. BACK:  Non-tender to palpation.  No CVAT SKIN:  Warm, dry and intact.    Results: No results found for this or any previous visit (from the past 24 hours).  I have reviewed prior patient's records  I have reviewed referring/prior physicians records  I have reviewed urinalysis  I have reviewed prior urine cultures  I reviewed prior imaging studies  Assessment: No diagnosis found.   Plan: ***

## 2023-12-20 ENCOUNTER — Ambulatory Visit (HOSPITAL_COMMUNITY): Payer: Self-pay | Admitting: Physical Therapy

## 2023-12-20 ENCOUNTER — Ambulatory Visit: Admitting: Urology

## 2023-12-20 DIAGNOSIS — N393 Stress incontinence (female) (male): Secondary | ICD-10-CM

## 2023-12-22 ENCOUNTER — Ambulatory Visit (HOSPITAL_COMMUNITY): Payer: Self-pay | Admitting: Physical Therapy

## 2023-12-23 ENCOUNTER — Ambulatory Visit (HOSPITAL_COMMUNITY): Admitting: Physical Therapy

## 2023-12-23 DIAGNOSIS — L89223 Pressure ulcer of left hip, stage 3: Secondary | ICD-10-CM

## 2023-12-23 DIAGNOSIS — M25552 Pain in left hip: Secondary | ICD-10-CM

## 2023-12-23 DIAGNOSIS — E11621 Type 2 diabetes mellitus with foot ulcer: Secondary | ICD-10-CM

## 2023-12-23 DIAGNOSIS — M79672 Pain in left foot: Secondary | ICD-10-CM

## 2023-12-23 NOTE — Therapy (Addendum)
 OUTPATIENT PHYSICAL THERAPY Wound  TREATMENT   Patient Name: Nicole [name] MRN: [id number] DOB:1965/03/20, 58 y.o., female Today's Date: 12/23/2023   PCP: Norleen Hurst REFERRING PROVIDER: Hyacinth Honey, NP  END OF SESSION:  PT End of Session - 12/23/23 1457     Visit Number 5    Number of Visits 16    Date for Recertification  01/19/24    Authorization Type Dual complete    PT Start Time 1405    PT Stop Time 1455    PT Time Calculation (min) 50 min    Activity Tolerance Patient tolerated treatment well    Behavior During Therapy [facility] for tasks assessed/performed          Past Medical History:  Diagnosis Date   CAP (community acquired pneumonia)    Streptococcus 01/2011   COPD (chronic obstructive pulmonary disease) (HCC)    Coronary atherosclerosis of native coronary artery    a. Diagnosed [place]  2006 - DES RCA, reports followup cath 2009 at [facility] b. reported 2 stents in [place]  in 2020. c. 07/2021: cath showing patent stents along RCA and mid-LAD with scattered 20% stenosis but no obstructive disease.   Dyslipidemia    Essential hypertension, benign    Morbid obesity (HCC)    Pancreatitis    Type 2 diabetes mellitus (HCC)    Past Surgical History:  Procedure Laterality Date   ABDOMINAL AORTOGRAM N/A 11/14/2023   Procedure: ABDOMINAL AORTOGRAM;  Surgeon: Sheree Penne Bruckner, MD;  Location: [facility] INVASIVE CV LAB;  Service: Cardiovascular;  Laterality: N/A;   ABDOMINAL HERNIA REPAIR     ABDOMINAL HYSTERECTOMY     AMPUTATION Left 01/25/2020   Procedure: LEFT BELOW KNEE AMPUTATION;  Surgeon: Harden Jerona GAILS, MD;  Location: [facility] OR;  Service: Orthopedics;  Laterality: Left;   CORONARY ANGIOPLASTY WITH STENT PLACEMENT  2006   KNEE SURGERY     LEFT HEART CATH AND CORONARY ANGIOGRAPHY N/A 08/14/2021   Procedure: LEFT HEART CATH AND CORONARY ANGIOGRAPHY;  Surgeon: Burnard Debby LABOR, MD;  Location: [facility] INVASIVE CV LAB;  Service: Cardiovascular;  Laterality: N/A;   LOWER  EXTREMITY ANGIOGRAPHY Right 11/14/2023   Procedure: Lower Extremity Angiography;  Surgeon: Sheree Penne Bruckner, MD;  Location: [facility] INVASIVE CV LAB;  Service: Cardiovascular;  Laterality: Right;   NOSE SURGERY     Pilonidal cystectomy     TONSILLECTOMY     Patient Active Problem List   Diagnosis Date Noted   Diabetic ulcer of right heel associated with type 2 diabetes mellitus (HCC) 11/10/2023   Pressure ulcer of left thigh 11/10/2023   Tremor 11/10/2023   Bilateral cataracts 09/09/2022   Essential tremor 09/09/2022   Trigger finger, unspecified finger 09/09/2022   Anxiety 12/30/2021   OSA on CPAP 12/09/2021   Atopic dermatitis 09/24/2021   Venous stasis ulcer of right lower leg with edema of right lower leg (HCC) 09/24/2021   Urinary incontinence 05/21/2021   Oxygen  dependent 05/14/2021   Chronic kidney disease due to type 2 diabetes mellitus (HCC) 04/16/2021   Subclinical hypothyroidism 04/15/2021   Hyperlipidemia 04/02/2021   Gastroesophageal reflux disease without esophagitis 04/02/2021   Osteoarthritis 04/02/2021   Morbid obesity (HCC) 04/02/2021   Hx of BKA, left (HCC) 09/09/2020   Vitamin D  deficiency 07/30/2020   Anxiety and depression 07/30/2020   Atrial fibrillation (HCC) 07/30/2020   Obesity, Class III, BMI 40-49.9 (morbid obesity) (HCC) 01/23/2020   Tobacco abuse--2 P/day Smoker 01/23/2020   Hypoalbuminemia due to protein-calorie malnutrition 01/22/2020   Uncontrolled  type 2 diabetes mellitus with hyperglycemia, with long-term current use of insulin  (HCC) 01/22/2020   Elevated alpha fetoprotein 01/22/2020   Diabetic ulcer of left foot (HCC) 12/05/2019   Heart failure, unspecified (HCC) 03/29/2019   TIA (transient ischemic attack) 04/10/2012   COPD (chronic obstructive pulmonary disease) (HCC) 11/09/2011   Coronary artery disease involving native coronary artery of native heart with angina pectoris 02/19/2011   Dyslipidemia    Type 2 diabetes mellitus with  obesity    Primary hypertension     ONSET DATE: chronic   REFERRING DIAG: L89.61  THERAPY DIAG:  E11.621 M79.671  Rationale for Evaluation and Treatment: Rehabilitation Pt recently admitted into the hospital  Admit date: 11/09/2023 Discharge date: 11/15/23  58 year old F with PMH of COPD, CAD, IDDM-2, OSA on CPAP but not working, A-fib not on [facility], morbid obesity, left BKA, HTN, tremor, HLD, GERD and prior tobacco use presented to [facility] with worsening right heel wound with pain, swelling pus drainage for about a week or 2, and admitted to [facility] with diabetic right heel ulcer after consultation with podiatry.  MRI negative for abscess or osteomyelitis but concerning for cellulitis.  ABI with mild disease.  VVS consulted.  Angiogram without significant finding.  Podiatry recommended p.o. Augmentin  and doxycycline  for 7 more days, wound care and outpatient follow-up. HX:  11/14/2023 Pre-operative Diagnosis: Right heel ulceration Post-operative diagnosis:  Same Surgeon:  Penne C. Sheree, MD Angiography performed with noted blockage of the iliac and femoral artery.  Not intervention was performed.  It was felt that the pt most likely needed but pt refused at this time.   12/23/23 0001  Subjective Assessment  Subjective PT states that her hip hurts more than her foot.  Patient and Family Stated Goals wounds to heal  Date of Onset 07/24/23  Prior Treatments self care, hospitalization and HH  Pain Assessment  Pain Scale 0-10  Pain Score 7  Pain Type Chronic pain  Pain Location Hip  Pain Orientation Left  Pain Intervention(s) Emotional support  Evaluation and Treatment  Evaluation and Treatment Procedures Explained to Patient/Family Yes  Evaluation and Treatment Procedures agreed to  Wound 11/10/23 0337 Pressure Injury Hip Left;Lateral  Date First Assessed/Time First Assessed: 11/10/23 0337   Present on Original Admission: Yes  Primary Wound Type: Pressure Injury   Location: Hip  Location Orientation: Left;Lateral  Wound Description (Comments): circular wound, minimal serous drainage....  Site / Wound Assessment Dry;Pink  Peri-wound Assessment Intact  Wound Length (cm) 1.8 cm  Wound Width (cm) 1.5 cm  Wound Surface Area (cm^2) 2.12 cm^2  Drainage Description Serosanguineous  Drainage Amount Small  Treatment Cleansed;Debridement (Selective)  Dressing Type Silver hydrofiber;Gauze (Comment);ABD;Foam - Lift dressing to assess site every shift (medipore tape)  Dressing Changed Changed  Dressing Status Intact;Old drainage  % Wound base Red or Granulating 65%  % Wound base Yellow/Fibrinous Exudate 45%  Undermining (cm) throughout wound  Margins Unattached edges (unapproximated)  Wound 11/10/23 0339 Diabetic Ulcer Heel Posterior;Right  Date First Assessed/Time First Assessed: 11/10/23 0339   Present on Original Admission: Yes  Primary Wound Type: Diabetic Ulcer  Location: Heel  Location Orientation: Posterior;Right  Wound Image   Site / Wound Assessment Black;Yellow  Wound Length (cm) 4.5 cm  Wound Width (cm) 2.8 cm  Wound Surface Area (cm^2) 9.9 cm^2  Drainage Amount Moderate  Treatments Cleansed;Site care;Other (Comment);Moisturizing cream (significant debridement from heel and foot)  Dressing Type Gauze (Comment) (debridement)  Dressing Status Dry;Intact  %  Wound base Red or Granulating 10%  % Wound base Yellow/Fibrinous Exudate 10%  % Wound base Black/Eschar 80%  Selective Debridement (non-excisional)  Selective Debridement (non-excisional) - Location Lt heel  Selective Debridement (non-excisional) - Tools Used Forceps;Scalpel;Scissors  Selective Debridement (non-excisional) - Tissue Removed eschar and sloubh  Wound Therapy - Assess/Plan/Recommendations  Wound Therapy - Clinical Statement see above  Wound Therapy - Functional Problem List unable to weight bear  Factors Delaying/Impairing Wound Healing Altered sensation;Diabetes  Mellitus;Infection - systemic/local;Incontinence;Immobility;Multiple medical problems;Polypharmacy  Hydrotherapy Plan Debridement;Dressing change;Patient/family education;Other (comment)  Wound Therapy - Frequency 2X / week  Wound Therapy - Current Recommendations PT  Wound Plan See for debridement as needed as dressing change as needed.  Wound Therapy  Dressing  Lt hip:  silver hydrofiber, cut out of  foam followed by ab pad 4x4 and medipore tape.  Dressing Rt Heel:  vaseline to callous area, silver hydrofiber to wound due to wound now draining , un load with cut off of ab pad and 1/2 foam followed by kerlix, and netting     PATIENT EDUCATION: Education details: The importance of off loading as much as possible,  Keep dressing on unless it becomes painful or wet ; complete exercises  Person educated: Patient Education method: Explanation Education comprehension: verbalized understanding   HOME EXERCISE PROGRAM: Ankle pumps, toe crunches    GOALS: Goals reviewed with patient? No  SHORT TERM GOALS: Target date: 12/22/23  Wounds to be 100% granulated  Baseline: Goal status: IN PROGRESS  2.  Rt heel pain to be decreased to no higher than a 2/10 Baseline:  Goal status: IN PROGRESS  3. Family to be knowledgeable of the importance of off loading wound areas.  Baseline:  Goal status: IN PROGRESS   LONG TERM GOALS: Target date: 01/19/24  Wounds to be healed.  Baseline:  Goal status: IN PROGRESS  2.  PT to have no heel pain Baseline:  Goal status: IN PROGRESS  3.  PT family  to verbalize the importance of using diabetic shoes at all time, with daily foot inspection.  Baseline:  Goal status: IN PROGRESS  ASSESSMENT:  CLINICAL IMPRESSION: 12/23/23:  PT no longer has green drainage from her Lt hip. Eschar on Lt heel is softening and slowly lifting off with noted increased granulation.  PT will continue to benefit from skilled PT to debride eschar and slough away from  wounds to promote a healing environment.  OBJECTIVE IMPAIRMENTS: decreased mobility, impaired sensation, prosthetic dependency , obesity, pain, and decreased skin integrity.   ACTIVITY LIMITATIONS: transfers  PERSONAL FACTORS: Fitness, Past/current experiences, Time since onset of injury/illness/exacerbation, and 3+ comorbidities: DM, COPD, CAD  are also affecting patient's functional outcome.   REHAB POTENTIAL: Fair    CLINICAL DECISION MAKING: Evolving/moderate complexity  EVALUATION COMPLEXITY: High  PLAN: PT FREQUENCY: 2x/week  PT DURATION: 8 weeks  PLANNED INTERVENTIONS: 97110-Therapeutic exercises, 97535- Self Care, 02859- Manual therapy, 97598- Wound care (each additional 20 sq cm), and Patient/Family education  PLAN FOR NEXT SESSION:  continue with debridement and  wound care.   Montie Metro, PT CLT  [phone] 12/23/2023, 3:02 PM

## 2023-12-28 ENCOUNTER — Ambulatory Visit (HOSPITAL_COMMUNITY): Payer: Self-pay | Attending: Family Medicine | Admitting: Physical Therapy

## 2023-12-28 ENCOUNTER — Ambulatory Visit (HOSPITAL_COMMUNITY): Payer: Self-pay | Admitting: Physical Therapy

## 2023-12-28 DIAGNOSIS — M25552 Pain in left hip: Secondary | ICD-10-CM | POA: Diagnosis present

## 2023-12-28 DIAGNOSIS — L89223 Pressure ulcer of left hip, stage 3: Secondary | ICD-10-CM | POA: Insufficient documentation

## 2023-12-28 DIAGNOSIS — M79672 Pain in left foot: Secondary | ICD-10-CM | POA: Insufficient documentation

## 2023-12-28 DIAGNOSIS — E11621 Type 2 diabetes mellitus with foot ulcer: Secondary | ICD-10-CM | POA: Diagnosis present

## 2023-12-28 DIAGNOSIS — L97428 Non-pressure chronic ulcer of left heel and midfoot with other specified severity: Secondary | ICD-10-CM | POA: Insufficient documentation

## 2023-12-28 NOTE — Therapy (Signed)
 OUTPATIENT PHYSICAL THERAPY Wound  TREATMENT   Patient Name: Nicole [name] MRN: [id number] DOB:[birth year], 58 y.o., female Today's Date: 12/28/2023   PCP: Norleen Hurst REFERRING PROVIDER: Hyacinth Honey, NP  END OF SESSION:  PT End of Session - 12/28/23 1233     Visit Number 6    Number of Visits 16    Date for Recertification  01/19/24    Authorization Type Dual complete    PT Start Time 1020    PT Stop Time 1058    PT Time Calculation (min) 38 min    Activity Tolerance Patient tolerated treatment well    Behavior During Therapy WFL for tasks assessed/performed          Past Medical History:  Diagnosis Date   CAP (community acquired pneumonia)    Streptococcus 01/2011   COPD (chronic obstructive pulmonary disease) (HCC)    Coronary atherosclerosis of native coronary artery    a. Diagnosed [place]  2006 - DES RCA, reports followup cath 2009 at [facility] b. reported 2 stents in [place]  in 2020. c. 07/2021: cath showing patent stents along RCA and mid-LAD with scattered 20% stenosis but no obstructive disease.   Dyslipidemia    Essential hypertension, benign    Morbid obesity (HCC)    Pancreatitis    Type 2 diabetes mellitus (HCC)    Past Surgical History:  Procedure Laterality Date   ABDOMINAL AORTOGRAM N/A 11/14/2023   Procedure: ABDOMINAL AORTOGRAM;  Surgeon: Sheree Penne Bruckner, MD;  Location: [facility] INVASIVE CV LAB;  Service: Cardiovascular;  Laterality: N/A;   ABDOMINAL HERNIA REPAIR     ABDOMINAL HYSTERECTOMY     AMPUTATION Left 01/25/2020   Procedure: LEFT BELOW KNEE AMPUTATION;  Surgeon: Harden Jerona GAILS, MD;  Location: [facility] OR;  Service: Orthopedics;  Laterality: Left;   CORONARY ANGIOPLASTY WITH STENT PLACEMENT  2006   KNEE SURGERY     LEFT HEART CATH AND CORONARY ANGIOGRAPHY N/A 08/14/2021   Procedure: LEFT HEART CATH AND CORONARY ANGIOGRAPHY;  Surgeon: Burnard Debby LABOR, MD;  Location: [facility] INVASIVE CV LAB;  Service: Cardiovascular;  Laterality: N/A;   LOWER  EXTREMITY ANGIOGRAPHY Right 11/14/2023   Procedure: Lower Extremity Angiography;  Surgeon: Sheree Penne Bruckner, MD;  Location: [facility] INVASIVE CV LAB;  Service: Cardiovascular;  Laterality: Right;   NOSE SURGERY     Pilonidal cystectomy     TONSILLECTOMY     Patient Active Problem List   Diagnosis Date Noted   Diabetic ulcer of right heel associated with type 2 diabetes mellitus (HCC) 11/10/2023   Pressure ulcer of left thigh 11/10/2023   Tremor 11/10/2023   Bilateral cataracts 09/09/2022   Essential tremor 09/09/2022   Trigger finger, unspecified finger 09/09/2022   Anxiety 12/30/2021   OSA on CPAP 12/09/2021   Atopic dermatitis 09/24/2021   Venous stasis ulcer of right lower leg with edema of right lower leg (HCC) 09/24/2021   Urinary incontinence 05/21/2021   Oxygen  dependent 05/14/2021   Chronic kidney disease due to type 2 diabetes mellitus (HCC) 04/16/2021   Subclinical hypothyroidism 04/15/2021   Hyperlipidemia 04/02/2021   Gastroesophageal reflux disease without esophagitis 04/02/2021   Osteoarthritis 04/02/2021   Morbid obesity (HCC) 04/02/2021   Hx of BKA, left (HCC) 09/09/2020   Vitamin D  deficiency 07/30/2020   Anxiety and depression 07/30/2020   Atrial fibrillation (HCC) 07/30/2020   Obesity, Class III, BMI 40-49.9 (morbid obesity) (HCC) 01/23/2020   Tobacco abuse--2 P/day Smoker 01/23/2020   Hypoalbuminemia due to protein-calorie malnutrition 01/22/2020   Uncontrolled  type 2 diabetes mellitus with hyperglycemia, with long-term current use of insulin  (HCC) 01/22/2020   Elevated alpha fetoprotein 01/22/2020   Diabetic ulcer of left foot (HCC) 12/05/2019   Heart failure, unspecified (HCC) 03/29/2019   TIA (transient ischemic attack) 04/10/2012   COPD (chronic obstructive pulmonary disease) (HCC) 11/09/2011   Coronary artery disease involving native coronary artery of native heart with angina pectoris 02/19/2011   Dyslipidemia    Type 2 diabetes mellitus with  obesity    Primary hypertension     ONSET DATE: chronic   REFERRING DIAG: L89.61  THERAPY DIAG:  E11.621 M79.671  Rationale for Evaluation and Treatment: Rehabilitation Pt recently admitted into the hospital  Admit date: 11/09/2023 Discharge date: 11/15/23  58 year old F with PMH of COPD, CAD, IDDM-2, OSA on CPAP but not working, A-fib not on [facility], morbid obesity, left BKA, HTN, tremor, HLD, GERD and prior tobacco use presented to [facility] with worsening right heel wound with pain, swelling pus drainage for about a week or 2, and admitted to [facility] with diabetic right heel ulcer after consultation with podiatry.  MRI negative for abscess or osteomyelitis but concerning for cellulitis.  ABI with mild disease.  VVS consulted.  Angiogram without significant finding.  Podiatry recommended p.o. Augmentin  and doxycycline  for 7 more days, wound care and outpatient follow-up. HX:  11/14/2023 Pre-operative Diagnosis: Right heel ulceration Post-operative diagnosis:  Same Surgeon:  Penne C. Sheree, MD Angiography performed with noted blockage of the iliac and femoral artery.  Not intervention was performed.  It was felt that the pt most likely needed but pt refused at this time.     12/28/23 0001  Subjective Assessment  Subjective Pt states she has been trying to stay off her wound.  Patient and Family Stated Goals wounds to heal  Date of Onset 07/24/23  Prior Treatments self care, hospitalization and HH  Pain Assessment  Pain Scale 0-10  Pain Score 3  Evaluation and Treatment  Evaluation and Treatment Procedures Explained to Patient/Family Yes  Evaluation and Treatment Procedures agreed to  Wound 11/10/23 0337 Pressure Injury Hip Left;Lateral  Date First Assessed/Time First Assessed: 11/10/23 0337   Present on Original Admission: Yes  Primary Wound Type: Pressure Injury  Location: Hip  Location Orientation: Left;Lateral  Wound Description (Comments): circular wound, minimal  serous drainage....  Site / Wound Assessment Dry;Pink  Peri-wound Assessment Intact  Wound Length (cm) 0.8 cm  Wound Width (cm) 0.9 cm  Wound Surface Area (cm^2) 0.57 cm^2  Wound Depth (cm)  (at least 1.2 cm)  Drainage Description Serosanguineous  Drainage Amount Small  Treatment Cleansed;Irrigation  Dressing Type Gauze (Comment);ABD;Foam - Lift dressing to assess site every shift;Other (Comment)  Dressing Changed Changed  Dressing Status Intact;Old drainage  % Wound base Red or Granulating 80%  % Wound base Yellow/Fibrinous Exudate 20%  Undermining (cm) throughout wound  Margins Unattached edges (unapproximated)  Wound 11/10/23 0339 Diabetic Ulcer Heel Posterior;Right  Date First Assessed/Time First Assessed: 11/10/23 0339   Present on Original Admission: Yes  Primary Wound Type: Diabetic Ulcer  Location: Heel  Location Orientation: Posterior;Right  Site / Wound Assessment Black;Yellow  Drainage Amount Small  Treatments Cleansed;Moisturizing cream;Site care;Other (Comment) (debridement of black eschar)  Dressing Type Gauze (Comment) (debridement)  Dressing Status Dry;Intact  % Wound base Red or Granulating 10%  % Wound base Yellow/Fibrinous Exudate 30%  % Wound base Black/Eschar 60%  Selective Debridement (non-excisional)  Selective Debridement (non-excisional) - Location Lt heel  Selective Debridement (  non-excisional) - Tools Used Forceps;Scalpel;Scissors  Selective Debridement (non-excisional) - Tissue Removed eschar and slough  Wound Therapy - Assess/Plan/Recommendations  Wound Therapy - Clinical Statement see above  Wound Therapy - Functional Problem List unable to weight bear  Factors Delaying/Impairing Wound Healing Altered sensation;Diabetes Mellitus;Infection - systemic/local;Incontinence;Immobility;Multiple medical problems;Polypharmacy  Hydrotherapy Plan Debridement;Dressing change;Patient/family education;Other (comment)  Wound Therapy - Frequency 2X / week  Wound  Therapy - Current Recommendations PT  Wound Plan See for debridement as needed as dressing change as needed.  Wound Therapy  Dressing  (S)  LT hip: wound packed with iodoform, 4x4 and ab pad.  Dressing Rt Heel:  vaseline to callous area, silver hydrofiber to wound due to wound now draining , un load with cut off of ab pad and 1/2 foam followed by kerlix, and netting    PATIENT EDUCATION: Education details: The importance of off loading as much as possible,  Keep dressing on unless it becomes painful or wet ; complete exercises  Person educated: Patient Education method: Explanation Education comprehension: verbalized understanding   HOME EXERCISE PROGRAM: Ankle pumps, toe crunches    GOALS: Goals reviewed with patient? No  SHORT TERM GOALS: Target date: 12/22/23  Wounds to be 100% granulated  Baseline: Goal status: IN PROGRESS  2.  Rt heel pain to be decreased to no higher than a 2/10 Baseline:  Goal status: IN PROGRESS  3. Family to be knowledgeable of the importance of off loading wound areas.  Baseline:  Goal status: IN PROGRESS   LONG TERM GOALS: Target date: 01/19/24  Wounds to be healed.  Baseline:  Goal status: IN PROGRESS  2.  PT to have no heel pain Baseline:  Goal status: IN PROGRESS  3.  PT family  to verbalize the importance of using diabetic shoes at all time, with daily foot inspection.  Baseline:  Goal status: IN PROGRESS  ASSESSMENT:  CLINICAL IMPRESSION: 12/23/23:  PT no longer has green drainage from her Lt hip. Eschar on Lt heel is softening and slowly lifting off with noted increased granulation.  PT will continue to benefit from skilled PT to debride eschar and slough away from wounds to promote a healing environment.  OBJECTIVE IMPAIRMENTS: decreased mobility, impaired sensation, prosthetic dependency , obesity, pain, and decreased skin integrity.   ACTIVITY LIMITATIONS: transfers  PERSONAL FACTORS: Fitness, Past/current experiences,  Time since onset of injury/illness/exacerbation, and 3+ comorbidities: DM, COPD, CAD  are also affecting patient's functional outcome.   REHAB POTENTIAL: Fair    CLINICAL DECISION MAKING: Evolving/moderate complexity  EVALUATION COMPLEXITY: High  PLAN: PT FREQUENCY: 2x/week  PT DURATION: 8 weeks  PLANNED INTERVENTIONS: 97110-Therapeutic exercises, 97535- Self Care, 02859- Manual therapy, 97598- Wound care (each additional 20 sq cm), and Patient/Family education  PLAN FOR NEXT SESSION:  continue with debridement and  wound care.   Montie Metro, PT CLT  [phone] 12/28/2023, 12:41 PM

## 2023-12-29 ENCOUNTER — Ambulatory Visit (HOSPITAL_COMMUNITY): Payer: Self-pay | Admitting: Physical Therapy

## 2023-12-29 ENCOUNTER — Ambulatory Visit: Admitting: Podiatry

## 2024-01-02 ENCOUNTER — Ambulatory Visit (INDEPENDENT_AMBULATORY_CARE_PROVIDER_SITE_OTHER): Admitting: Nurse Practitioner

## 2024-01-02 ENCOUNTER — Encounter: Payer: Self-pay | Admitting: Nurse Practitioner

## 2024-01-02 VITALS — BP 116/76 | HR 77 | Ht 63.5 in | Wt 209.0 lb

## 2024-01-02 DIAGNOSIS — Z7985 Long-term (current) use of injectable non-insulin antidiabetic drugs: Secondary | ICD-10-CM

## 2024-01-02 DIAGNOSIS — E1165 Type 2 diabetes mellitus with hyperglycemia: Secondary | ICD-10-CM

## 2024-01-02 DIAGNOSIS — E782 Mixed hyperlipidemia: Secondary | ICD-10-CM | POA: Diagnosis not present

## 2024-01-02 DIAGNOSIS — Z794 Long term (current) use of insulin: Secondary | ICD-10-CM | POA: Diagnosis not present

## 2024-01-02 DIAGNOSIS — I10 Essential (primary) hypertension: Secondary | ICD-10-CM

## 2024-01-02 DIAGNOSIS — Z7984 Long term (current) use of oral hypoglycemic drugs: Secondary | ICD-10-CM

## 2024-01-02 MED ORDER — HUMULIN R U-500 KWIKPEN 500 UNIT/ML ~~LOC~~ SOPN: 60.0000 [IU] | PEN_INJECTOR | Freq: Three times a day (TID) | SUBCUTANEOUS | 3 refills | Status: AC

## 2024-01-02 NOTE — Progress Notes (Signed)
 Endocrinology Follow Up Note       01/02/2024, 1:52 PM   Subjective:    Patient ID: Nicole [name], female    DOB: April 30, 1965.  Nicole [name] is being seen in follow up after being seen in consultation for management of currently uncontrolled symptomatic diabetes requested by  Shona Norleen PEDLAR, MD.   Past Medical History:  Diagnosis Date   CAP (community acquired pneumonia)    Streptococcus 01/2011   COPD (chronic obstructive pulmonary disease) (HCC)    Coronary atherosclerosis of native coronary artery    a. Diagnosed [place]  2006 - DES RCA, reports followup cath 2009 at [facility] b. reported 2 stents in [place]  in 2020. c. 07/2021: cath showing patent stents along RCA and mid-LAD with scattered 20% stenosis but no obstructive disease.   Dyslipidemia    Essential hypertension, benign    Morbid obesity (HCC)    Pancreatitis    Type 2 diabetes mellitus (HCC)     Past Surgical History:  Procedure Laterality Date   ABDOMINAL AORTOGRAM N/A 11/14/2023   Procedure: ABDOMINAL AORTOGRAM;  Surgeon: Sheree Penne Bruckner, MD;  Location: [facility] INVASIVE CV LAB;  Service: Cardiovascular;  Laterality: N/A;   ABDOMINAL HERNIA REPAIR     ABDOMINAL HYSTERECTOMY     AMPUTATION Left 01/25/2020   Procedure: LEFT BELOW KNEE AMPUTATION;  Surgeon: Harden Jerona GAILS, MD;  Location: [facility] OR;  Service: Orthopedics;  Laterality: Left;   CORONARY ANGIOPLASTY WITH STENT PLACEMENT  2006   KNEE SURGERY     LEFT HEART CATH AND CORONARY ANGIOGRAPHY N/A 08/14/2021   Procedure: LEFT HEART CATH AND CORONARY ANGIOGRAPHY;  Surgeon: Burnard Debby LABOR, MD;  Location: [facility] INVASIVE CV LAB;  Service: Cardiovascular;  Laterality: N/A;   LOWER EXTREMITY ANGIOGRAPHY Right 11/14/2023   Procedure: Lower Extremity Angiography;  Surgeon: Sheree Penne Bruckner, MD;  Location: [facility] INVASIVE CV LAB;  Service: Cardiovascular;  Laterality: Right;   NOSE SURGERY      Pilonidal cystectomy     TONSILLECTOMY      Social History   Socioeconomic History   Marital status: Single    Spouse name: Not on file   Number of children: 3   Years of education: Not on file   Highest education level: Not on file  Occupational History   Occupation: Home health aide    Employer: [employer]  Tobacco Use   Smoking status: Former    Current packs/day: 0.00    Average packs/day: 0.5 packs/day for 20.0 years (10.0 ttl pk-yrs)    Types: Cigarettes    Start date: 01/22/2000    Quit date: 01/22/2020    Years since quitting: 3.9   Smokeless tobacco: Never  Vaping Use   Vaping status: Never Used  Substance and Sexual Activity   Alcohol use: No   Drug use: No   Sexual activity: Yes    Birth control/protection: Surgical  Other Topics Concern   Not on file  Social History Narrative   Not on file   Social Drivers of Health   Financial Resource Strain: Not on file  Food Insecurity: No Food Insecurity (11/17/2023)   Hunger Vital Sign    Worried About  Running Out of Food in the Last Year: Never true    Ran Out of Food in the Last Year: Never true  Transportation Needs: No Transportation Needs (11/17/2023)   PRAPARE - Administrator, Civil Service (Medical): No    Lack of Transportation (Non-Medical): No  Physical Activity: Not on file  Stress: Not on file  Social Connections: Not on file    Family History  Problem Relation Age of Onset   Diabetes Mother    Heart failure Mother    Hypertension Mother    Kidney failure Mother    Ovarian cancer Mother    Asthma Son     Outpatient Encounter Medications as of 01/02/2024  Medication Sig   Accu-Chek Softclix Lancets lancets Use as instructed to monitor glucose 4 times daily- use as backup to Dexcom   albuterol  (VENTOLIN  HFA) 108 (90 Base) MCG/ACT inhaler Inhale 2 puffs into the lungs every 6 (six) hours as needed for wheezing. Shortness of breath   busPIRone  (BUSPAR ) 5 MG tablet  Take 5 mg by mouth 2 (two) times daily.   Cholecalciferol  1.25 MG (50000 UT) capsule Take 50,000 Units by mouth once a week.   clopidogrel  (PLAVIX ) 75 MG tablet Take 1 tablet by mouth once daily   EASY TOUCH INSULIN  SYRINGE 31G X 5/16 1 ML MISC    EPINEPHrine  0.3 mg/0.3 mL IJ SOAJ injection Inject 0.3 mg into the muscle as needed for anaphylaxis.   gabapentin  (NEURONTIN ) 300 MG capsule Take 1 capsule (300 mg total) by mouth at bedtime.   glucose blood (ACCU-CHEK GUIDE TEST) test strip Use as instructed to monitor glucose 4 times daily- use as back up for Dexcom   Insulin  Pen Needle (B-D ULTRAFINE III SHORT PEN) 31G X 8 MM MISC USE AS DIRECTED   isosorbide  mononitrate (IMDUR ) 30 MG 24 hr tablet Take 1 tablet (30 mg total) by mouth daily.   levocetirizine (XYZAL ) 5 MG tablet Take 5 mg by mouth daily.   metoprolol  tartrate (LOPRESSOR ) 25 MG tablet Take 0.5 tablets (12.5 mg total) by mouth 2 (two) times daily.   nitroGLYCERIN  (NITROSTAT ) 0.4 MG SL tablet DISSOLVE ONE TABLET UNDER THE TONGUE EVERY 5 MINUTES AS NEEDED FOR CHEST PAIN.  DO NOT EXCEED A TOTAL OF 3 DOSES IN 15 MINUTES   nystatin  powder Apply 1 Application topically 3 (three) times daily.   pantoprazole  (PROTONIX ) 40 MG tablet Take 1 tablet (40 mg total) by mouth daily.   rosuvastatin  (CRESTOR ) 40 MG tablet Take 40 mg by mouth at bedtime.   senna-docusate (SENOKOT-S) 8.6-50 MG tablet Take 1-2 tablets by mouth 2 (two) times daily between meals as needed for mild constipation or moderate constipation.   torsemide  (DEMADEX ) 20 MG tablet Take 2 tablets by mouth once daily   traMADol  (ULTRAM ) 50 MG tablet Take 50 mg by mouth every 8 (eight) hours.   VRAYLAR  3 MG capsule Take 3 mg by mouth daily. (Patient taking differently: Take 1.5 mg by mouth daily.)   [DISCONTINUED] insulin  regular human CONCENTRATED (HUMULIN  R U-500 KWIKPEN) 500 UNIT/ML KwikPen Inject 90 Units into the skin 3 (three) times daily with meals. (Patient taking differently:  Inject 50 Units into the skin 3 (three) times daily with meals.)   insulin  regular human CONCENTRATED (HUMULIN  R U-500 KWIKPEN) 500 UNIT/ML KwikPen Inject 60 Units into the skin 3 (three) times daily with meals.   [DISCONTINUED] potassium chloride  SA (KLOR-CON  M20) 20 MEQ tablet TAKE 1 TABLET BY MOUTH ONCE DAILY. PATIENT  IS OVERDUE FOR A FOLLOW-UP APPOINTMENT. PLEASE SCHEDULE AN APPOINTMENT FOR ADDITIONAL REFILLS. (Patient not taking: Reported on 01/02/2024)   [DISCONTINUED] Semaglutide , 2 MG/DOSE, 8 MG/3ML SOPN Inject 2 mg as directed once a week. (Patient not taking: Reported on 01/02/2024)   No facility-administered encounter medications on file as of 01/02/2024.    ALLERGIES: Allergies  Allergen Reactions   Aspirin  Anaphylaxis and Other (See Comments)    Throat closing    Bee Venom Anaphylaxis   Pineapple Anaphylaxis   Definity  [Perflutren  Lipid Microsphere]     Muscle Aches   E-Mycin [Erythromycin Base] Nausea And Vomiting    VACCINATION STATUS: Immunization History  Administered Date(s) Administered   Influenza,inj,Quad PF,6+ Mos 12/25/2019, 12/23/2020   Moderna Sars-Covid-2 Vaccination 06/25/2019, 07/25/2019   Pneumococcal Polysaccharide-23 04/11/2012, 12/25/2019    Diabetes She presents for her follow-up diabetic visit. She has type 2 diabetes mellitus. Onset time: diagnosed at approx age of 38. Her disease course has been fluctuating. There are no hypoglycemic associated symptoms. Associated symptoms include fatigue, foot paresthesias, polydipsia and weight loss. Pertinent negatives for diabetes include no polyuria. There are no hypoglycemic complications. Symptoms are stable. Diabetic complications include heart disease and peripheral neuropathy. (Hx Left BKA, MI) Risk factors for coronary artery disease include diabetes mellitus, dyslipidemia, family history, obesity, hypertension and sedentary lifestyle. Current diabetic treatment includes intensive insulin  program. She is  compliant with treatment most of the time. Her weight is decreasing steadily. She is following a generally unhealthy diet. When asked about meal planning, she reported none. She has not had a previous visit with a dietitian. She rarely participates in exercise. Her home blood glucose trend is fluctuating minimally. Her breakfast blood glucose range is generally >200 mg/dl. Her lunch blood glucose range is generally >200 mg/dl. Her dinner blood glucose range is generally >200 mg/dl. Her bedtime blood glucose range is generally 140-180 mg/dl. (She presents today with her CGM showing gross hyperglycemia overall.  Her most recent A1c on 9/29 was 13.2%.  She has been staying with a different daughter which has drastically changed her diet.  She is not drinking sodas anymore, not snacking on chips all day either.  Analysis of her CGM shows TIR 12%, TAR 88%, TBR 0% with a GMI of 9.5%. ) An ACE inhibitor/angiotensin II receptor blocker is being taken. She sees a podiatrist.Eye exam is current.     Review of systems  Constitutional: + decreasing body weight, current Body mass index is 36.44 kg/m., + fatigue, no subjective hyperthermia, no subjective hypothermia Eyes: + blurry vision, no xerophthalmia ENT: no sore throat, no nodules palpated in throat, no dysphagia/odynophagia, no hoarseness Cardiovascular: no chest pain, no shortness of breath, no palpitations Respiratory: no cough, + shortness of breath- hx COPD on chronic O2 (not wearing today) Gastrointestinal: no nausea/vomiting, + diarrhea Musculoskeletal: no muscle/joint aches, WC bound due to hx of L BKA- has prosthesis Skin: no rashes, no hyperemia, + right foot ulcer Neurological: no tremors, no numbness, no tingling, no dizziness Psychiatric: + depression, no anxiety  Objective:     BP 116/76 (BP Location: Left Arm, Patient Position: Sitting, Cuff Size: Large)   Pulse 77   Ht 5' 3.5 (1.613 m)   Wt 209 lb (94.8 kg) Comment: per patient as  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 , not in acute distress, normal state of mind, got tearful during appt Eyes:  EOMI, no exophthalmos Musculoskeletal: WC bound- hx L BKA with prosthesis Skin:  no rashes, no hyperemia Neurological: no tremor with outstretched hands   Diabetic Foot Exam - Simple   No data filed    CMP ( most recent) CMP     Component Value Date/Time   NA 137 11/14/2023 0356   K 4.3 11/14/2023 0356   CL 101 11/14/2023 0356   CO2 26 11/14/2023 0356   GLUCOSE 205 (H) 11/14/2023 0356   BUN 16 11/14/2023 0356   CREATININE 0.91 11/14/2023 0356   CREATININE 0.86 07/30/2020 1558   CALCIUM  9.3 11/14/2023 0356   PROT 6.7 11/10/2023 0137   ALBUMIN 2.9 (L) 11/12/2023 0731   AST 19 11/10/2023 0137   ALT 12 11/10/2023 0137   ALKPHOS 77 11/10/2023 0137   BILITOT 0.5 11/10/2023 0137   GFRNONAA >60 11/14/2023 0356   GFRNONAA 76 07/30/2020 1558   GFRAA 88 07/30/2020 1558     Diabetic Labs (most recent): Lab Results  Component Value Date   HGBA1C 13.2 (H) 11/10/2023   HGBA1C 13.5 (A) 03/10/2023   HGBA1C 8.1 (A) 08/17/2022     Lipid Panel ( most recent) Lipid Panel     Component Value Date/Time   CHOL 109 12/15/2021 0551   TRIG 304 (H) 12/15/2021 0551   HDL 25 (L) 12/15/2021 0551   CHOLHDL 4.4 12/15/2021 0551   VLDL 61 (H) 12/15/2021 0551   LDLCALC 23 12/15/2021 0551   LDLCALC 65 12/25/2019 1359      Lab Results  Component Value Date   TSH 2.06 12/25/2019   TSH 2.800 04/11/2012   TSH 4.814 (H) 11/10/2011   TSH 2.672 02/19/2011   FREET4 1.2 12/25/2019           Assessment & Plan:   1) Type 2 diabetes  mellitus with hyperglycemia, with long-term current use of insulin  (HCC)  She presents today with her CGM showing gross hyperglycemia overall.  Her most recent A1c on 9/29 was 13.2%.  She has been staying with a different daughter which has drastically changed her diet.  She is not drinking sodas anymore, not snacking on chips all day either.  Analysis of her CGM shows TIR 12%, TAR 88%, TBR 0% with a GMI of 9.5%.   Her PCP stopped the Ozempic  several months ago due to GI issues (alternating constipation/diarrhea).  - Nicole [name] has currently uncontrolled symptomatic type 2 DM since 58 years of age.   -Recent labs reviewed.  - I had a long discussion with her about the progressive nature of diabetes and the pathology behind its complications. -her diabetes is complicated by PVD- L BKA, MI, CAD, CHF and she remains at a high risk for more acute and chronic complications which include CAD, CVA, CKD, retinopathy, and neuropathy. These are all discussed in detail with her.  - Nutritional counseling repeated/built upon at each appointment.  - The patient admits there is a room for improvement in their diet and drink choices. -  Suggestion is made for the patient to avoid simple carbohydrates from their diet including Cakes, Sweet Desserts / Pastries, Ice Cream, Soda (diet and regular), Sweet Tea, Candies, Chips, Cookies, Sweet  Pastries, Store Bought Juices, Alcohol in Excess of 1-2 drinks a day, Artificial Sweeteners, Coffee Creamer, and Sugar-free Products. This will help patient to have stable blood glucose profile and potentially avoid unintended weight gain.   - I encouraged the patient to switch to unprocessed or minimally processed complex starch and increased protein intake (animal or plant source), fruits, and vegetables.   - Patient is advised to stick to a routine mealtimes to eat 3 meals a day and avoid unnecessary snacks (to snack only to correct hypoglycemia).  - she will be  scheduled with Penny Crumpton, RDN, CDE for diabetes education.  - I have approached her with the following individualized plan to manage her diabetes and patient agrees:   -She is advised to adjust her U500 to 60 units with breakfast and 50 units with lunch and supper if glucose is above 90 and she is eating.  She can stay off Ozempic  and like meds due to recent GI issues.  -She did not tolerate Jardiance in the past due to yeast in abdominal folds.  -she is encouraged to continue monitoring glucose 4 times daily, before meals and before bed (using her CGM), and to call the clinic if she has readings less than 70 or above 300 for 3 tests in a row.  - she is warned not to take insulin  without proper monitoring per orders. - Adjustment parameters are given to her for hypo and hyperglycemia in writing.  - Specific targets for  A1c; LDL, HDL, and Triglycerides were discussed with the patient.  2) Blood Pressure /Hypertension:  her blood pressure is controlled to target.   she is advised to continue her current medications as prescribed by her PCP.  3) Lipids/Hyperlipidemia:    Review of her recent lipid panel from 11/21/23 showed controlled LDL at 46.  she is advised to continue Crestor  40 mg daily at bedtime.  Side effects and precautions discussed with her.  She is also advised to avoid fried foods and butter.    4)  Weight/Diet:  her Body mass index is 36.44 kg/m.  -  clearly complicating her diabetes care.   she is a candidate for weight loss. I discussed with her the fact that loss of 5 - 10% of her  current body weight will have the most impact on her diabetes management.  Exercise, and detailed carbohydrates information provided  -  detailed on discharge instructions.  5) Chronic Care/Health Maintenance: -she is on ACEI/ARB and Statin medications and is encouraged to initiate and continue to follow up with Ophthalmology, Dentist, Podiatrist at least yearly or according to  recommendations, and advised to stay away from smoking. I have recommended yearly flu vaccine and pneumonia vaccine at least every 5 years; moderate intensity exercise for up to 150 minutes weekly; and sleep for at least 7 hours a day.  - she is advised to maintain close follow up with Shona Norleen PEDLAR, MD for primary care needs, as well as her other providers for optimal and coordinated care.     I spent  54  minutes in the care of the patient today including review of labs from CMP, Lipids, Thyroid  Function, Hematology (current and previous including abstractions from other facilities); face-to-face time discussing  her blood glucose readings/logs, discussing hypoglycemia and hyperglycemia episodes and symptoms, medications doses, her options of short and long term treatment based on the latest standards of care / guidelines;  discussion about incorporating lifestyle medicine;  and documenting the encounter. Risk reduction  counseling performed per USPSTF guidelines to reduce obesity and cardiovascular risk factors.     Please refer to Patient Instructions for Blood Glucose Monitoring and Insulin /Medications Dosing Guide  in media tab for additional information. Please  also refer to  Patient Self Inventory in the Media  tab for reviewed elements of pertinent patient history.  Nicole [name] participated in the discussions, expressed understanding, and voiced agreement with the above plans.  All questions were answered to her satisfaction. she is encouraged to contact clinic should she have any questions or concerns prior to her return visit.     Follow up plan: - Return in about 3 months (around 04/03/2024) for Diabetes F/U with A1c in office, No previsit labs, Bring meter and logs.   Benton Rio, [facility] [address] [place], [place] [address] Phone: [phone] Fax: [phone]  01/02/2024, 1:52 PM

## 2024-01-03 ENCOUNTER — Ambulatory Visit (HOSPITAL_COMMUNITY): Admitting: Physical Therapy

## 2024-01-08 ENCOUNTER — Emergency Department (HOSPITAL_COMMUNITY)
Admission: EM | Admit: 2024-01-08 | Discharge: 2024-01-08 | Disposition: A | Discharge status: Home / Self Care | Attending: Emergency Medicine | Admitting: Emergency Medicine

## 2024-01-08 ENCOUNTER — Emergency Department (HOSPITAL_COMMUNITY)

## 2024-01-08 ENCOUNTER — Other Ambulatory Visit: Payer: Self-pay

## 2024-01-08 ENCOUNTER — Encounter: Payer: Self-pay | Admitting: Podiatry

## 2024-01-08 ENCOUNTER — Encounter (HOSPITAL_COMMUNITY): Payer: Self-pay

## 2024-01-08 DIAGNOSIS — E119 Type 2 diabetes mellitus without complications: Secondary | ICD-10-CM | POA: Insufficient documentation

## 2024-01-08 DIAGNOSIS — Z79899 Other long term (current) drug therapy: Secondary | ICD-10-CM | POA: Diagnosis not present

## 2024-01-08 DIAGNOSIS — Z794 Long term (current) use of insulin: Secondary | ICD-10-CM | POA: Insufficient documentation

## 2024-01-08 DIAGNOSIS — Z7902 Long term (current) use of antithrombotics/antiplatelets: Secondary | ICD-10-CM | POA: Insufficient documentation

## 2024-01-08 DIAGNOSIS — L89619 Pressure ulcer of right heel, unspecified stage: Secondary | ICD-10-CM | POA: Insufficient documentation

## 2024-01-08 DIAGNOSIS — Z4800 Encounter for change or removal of nonsurgical wound dressing: Secondary | ICD-10-CM | POA: Diagnosis not present

## 2024-01-08 DIAGNOSIS — L97419 Non-pressure chronic ulcer of right heel and midfoot with unspecified severity: Secondary | ICD-10-CM

## 2024-01-08 LAB — CBC WITH DIFFERENTIAL/PLATELET
Abs Immature Granulocytes: 0.01 K/uL (ref 0.00–0.07)
Basophils Absolute: 0 K/uL (ref 0.0–0.1)
Basophils Relative: 1 %
Eosinophils Absolute: 0.1 K/uL (ref 0.0–0.5)
Eosinophils Relative: 2 %
HCT: 33.1 % — ABNORMAL LOW (ref 36.0–46.0)
Hemoglobin: 10.9 g/dL — ABNORMAL LOW (ref 12.0–15.0)
Immature Granulocytes: 0 %
Lymphocytes Relative: 45 %
Lymphs Abs: 2.8 K/uL (ref 0.7–4.0)
MCH: 31 pg (ref 26.0–34.0)
MCHC: 32.9 g/dL (ref 30.0–36.0)
MCV: 94 fL (ref 80.0–100.0)
Monocytes Absolute: 0.4 K/uL (ref 0.1–1.0)
Monocytes Relative: 7 %
Neutro Abs: 2.8 K/uL (ref 1.7–7.7)
Neutrophils Relative %: 45 %
Platelets: 156 K/uL (ref 150–400)
RBC: 3.52 MIL/uL — ABNORMAL LOW (ref 3.87–5.11)
RDW: 13.6 % (ref 11.5–15.5)
WBC: 6.1 K/uL (ref 4.0–10.5)
nRBC: 0 % (ref 0.0–0.2)

## 2024-01-08 LAB — BASIC METABOLIC PANEL WITH GFR
Anion gap: 10 (ref 5–15)
BUN: 17 mg/dL (ref 6–20)
CO2: 29 mmol/L (ref 22–32)
Calcium: 9.2 mg/dL (ref 8.9–10.3)
Chloride: 102 mmol/L (ref 98–111)
Creatinine, Ser: 0.84 mg/dL (ref 0.44–1.00)
GFR, Estimated: >60 mL/min
Glucose, Bld: 230 mg/dL — ABNORMAL HIGH (ref 70–99)
Potassium: 4.1 mmol/L (ref 3.5–5.1)
Sodium: 141 mmol/L (ref 135–145)

## 2024-01-08 LAB — SEDIMENTATION RATE: Sed Rate: 37 mm/h — ABNORMAL HIGH (ref 0–30)

## 2024-01-08 MED ORDER — HYDROCODONE-ACETAMINOPHEN 5-325 MG PO TABS: 1.0000 | ORAL_TABLET | Freq: Four times a day (QID) | ORAL | 0 refills | Status: DC | PRN

## 2024-01-08 MED ORDER — DOXYCYCLINE HYCLATE 100 MG PO CAPS: 100.0000 mg | ORAL_CAPSULE | Freq: Two times a day (BID) | ORAL | 0 refills | Status: DC

## 2024-01-08 MED ORDER — DOXYCYCLINE HYCLATE 100 MG PO TABS
100.0000 mg | ORAL_TABLET | Freq: Once | ORAL | Status: AC
  Administered 2024-01-08: 100 mg via ORAL
  Filled 2024-01-08: qty 1

## 2024-01-08 MED ORDER — HYDROCODONE-ACETAMINOPHEN 5-325 MG PO TABS
1.0000 | ORAL_TABLET | Freq: Once | ORAL | Status: AC
  Administered 2024-01-08: 1 via ORAL
  Filled 2024-01-08: qty 1

## 2024-01-08 NOTE — ED Provider Notes (Signed)
 [place] EMERGENCY DEPARTMENT AT [facility] Provider Note   CSN: [id number] Arrival date & time: 01/08/24  2037     Patient presents with: Wound Check   Nicole [name] is a 58 y.o. female.  She has a striae of diabetes, left BKA.  She has had ongoing right heel wound and left hip wound.  Follows with podiatry and wound clinic.  Had some tissue debrided from the heel last week and since then has had increased pain and drainage.  No known fever.  Sugars have been in the 200s, baseline for patient.  Was on gabapentin  for pain but ran out.  Is not due to see wound clinic until Friday.  {Add pertinent medical, surgical, social history, OB history to YEP:[id number]} The history is provided by the patient and a relative.  Wound Check This is a chronic problem. The problem has been gradually worsening. Pertinent negatives include no chest pain and no shortness of breath. The symptoms are aggravated by walking. Nothing relieves the symptoms. She has tried rest for the symptoms. The treatment provided no relief.       Prior to Admission medications   Medication Sig Start Date End Date Taking? Authorizing Provider  Accu-Chek Softclix Lancets lancets Use as instructed to monitor glucose 4 times daily- use as backup to Dexcom 03/10/23   Therisa Benton PARAS, NP  albuterol  (VENTOLIN  HFA) 108 (90 Base) MCG/ACT inhaler Inhale 2 puffs into the lungs every 6 (six) hours as needed for wheezing. Shortness of breath 07/30/20   Elnor Lauraine BRAVO, NP  busPIRone  (BUSPAR ) 5 MG tablet Take 5 mg by mouth 2 (two) times daily. 08/08/23   [provider]  Cholecalciferol  1.25 MG (50000 UT) capsule Take 50,000 Units by mouth once a week. 09/12/23   [provider]  clopidogrel  (PLAVIX ) 75 MG tablet Take 1 tablet by mouth once daily 11/22/23   Okey Vina GAILS, MD  EASY TOUCH INSULIN  SYRINGE 31G X 5/16 1 ML MISC  09/21/19   [provider]  EPINEPHrine  0.3 mg/0.3 mL IJ SOAJ injection Inject 0.3 mg  into the muscle as needed for anaphylaxis. 05/15/20   Elnor Lauraine BRAVO, NP  gabapentin  (NEURONTIN ) 300 MG capsule Take 1 capsule (300 mg total) by mouth at bedtime. 11/15/23 11/14/24  Gonfa, Taye T, MD  glucose blood (ACCU-CHEK GUIDE TEST) test strip Use as instructed to monitor glucose 4 times daily- use as back up for [facility] 03/10/23   Therisa Benton PARAS, NP  Insulin  Pen Needle (B-D ULTRAFINE III SHORT PEN) 31G X 8 MM MISC USE AS DIRECTED 07/06/21   Jaycee Greig PARAS, NP  insulin  regular human CONCENTRATED (HUMULIN  R U-500 KWIKPEN) 500 UNIT/ML KwikPen Inject 60 Units into the skin 3 (three) times daily with meals. 01/02/24   Therisa Benton PARAS, NP  isosorbide  mononitrate (IMDUR ) 30 MG 24 hr tablet Take 1 tablet (30 mg total) by mouth daily. 11/17/23 02/15/24  Johnson Laymon [name], PA-C  levocetirizine (XYZAL ) 5 MG tablet Take 5 mg by mouth daily.    [provider]  metoprolol  tartrate (LOPRESSOR ) 25 MG tablet Take 0.5 tablets (12.5 mg total) by mouth 2 (two) times daily. 11/17/23 02/15/24  Strader, Laymon [name], PA-C  nitroGLYCERIN  (NITROSTAT ) 0.4 MG SL tablet DISSOLVE ONE TABLET UNDER THE TONGUE EVERY 5 MINUTES AS NEEDED FOR CHEST PAIN.  DO NOT EXCEED A TOTAL OF 3 DOSES IN 15 MINUTES 09/07/22   Okey Vina GAILS, MD  nystatin  powder Apply 1 Application topically 3 (three)  times daily. 08/20/23   Geroldine Berg, MD  pantoprazole  (PROTONIX ) 40 MG tablet Take 1 tablet (40 mg total) by mouth daily. 07/30/20   Elnor Lauraine BRAVO, NP  rosuvastatin  (CRESTOR ) 40 MG tablet Take 40 mg by mouth at bedtime. 04/16/21   [provider]  senna-docusate (SENOKOT-S) 8.6-50 MG tablet Take 1-2 tablets by mouth 2 (two) times daily between meals as needed for mild constipation or moderate constipation. 11/15/23   Gonfa, Taye T, MD  torsemide  (DEMADEX ) 20 MG tablet Take 2 tablets by mouth once daily 09/19/23   Okey Vina GAILS, MD  traMADol  (ULTRAM ) 50 MG tablet Take 50 mg by mouth every 8 (eight) hours. 10/25/23   [provider]   VRAYLAR  3 MG capsule Take 3 mg by mouth daily. Patient taking differently: Take 1.5 mg by mouth daily. 11/15/21   [provider]    Allergies: Aspirin , Bee venom, Pineapple, Definity  [perflutren  lipid microsphere], and E-mycin [erythromycin base]    Review of Systems  Constitutional:  Negative for fever.  Respiratory:  Negative for shortness of breath.   Cardiovascular:  Negative for chest pain.  Gastrointestinal:  Negative for vomiting.  Skin:  Positive for wound.    Updated Vital Signs BP (!) 136/49   Pulse 63   Temp 98.5 F (36.9 C) (Oral)   Resp 20   Ht 5' 4 (1.626 m)   Wt 94.8 kg   SpO2 97%   BMI 35.87 kg/m   Physical Exam Vitals and nursing note reviewed.  Constitutional:      General: She is not in acute distress.    Appearance: She is well-developed.  HENT:     Head: Normocephalic and atraumatic.  Eyes:     Conjunctiva/sclera: Conjunctivae normal.  Cardiovascular:     Rate and Rhythm: Normal rate and regular rhythm.     Heart sounds: No murmur heard. Pulmonary:     Effort: Pulmonary effort is normal. No respiratory distress.     Breath sounds: Normal breath sounds.  Abdominal:     Palpations: Abdomen is soft.     Tenderness: There is no abdominal tenderness.  Musculoskeletal:     Cervical back: Neck supple.     Comments: She has had a left BKA.  She has approximately 4 cm ulcer on her heel of her right foot.  There is a little bit of surrounding erythema.  There is no gross drainage or fluctuance.  The area is tender however.  There is also a smaller wound on her left hip approximately 2 cm with no significant signs of infection.  Pictures included below.  She has good distal pulse in her right foot.  Skin:    General: Skin is warm and dry.     Capillary Refill: Capillary refill takes less than 2 seconds.  Neurological:     General: No focal deficit present.     Mental Status: She is alert.        (all labs ordered are listed, but only  abnormal results are displayed) Labs Reviewed - No data to display  EKG: None  Radiology: No results found.  {Document cardiac monitor, telemetry assessment procedure when appropriate:32947} Procedures   Medications Ordered in the ED  HYDROcodone -acetaminophen  (NORCO/VICODIN) 5-325 MG per tablet 1 tablet (has no administration in time range)      {Click here for ABCD2, HEART and other calculators REFRESH Note before signing:1}  Medical Decision Making Amount and/or Complexity of Data Reviewed Labs: ordered. Radiology: ordered.  Risk Prescription drug management.   This patient complains of ***; this involves an extensive number of treatment Options and is a complaint that carries with it a high risk of complications and morbidity. The differential includes ***  I ordered, reviewed and interpreted labs, which included *** I ordered medication *** and reviewed PMP when indicated. I ordered imaging studies which included *** and I independently    visualized and interpreted imaging which showed *** Additional history obtained from *** Previous records obtained and reviewed *** I consulted *** and discussed lab and imaging findings and discussed disposition.  Cardiac monitoring reviewed, *** Social determinants considered, *** Critical Interventions: ***  After the interventions stated above, I reevaluated the patient and found *** Admission and further testing considered, ***   {Document critical care time when appropriate  Document review of labs and clinical decision tools ie CHADS2VASC2, etc  Document your independent review of radiology images and any outside records  Document your discussion with family members, caretakers and with consultants  Document social determinants of health affecting pt's care  Document your decision making why or why not admission, treatments were needed:32947:::1}   Final diagnoses:  None    ED  Discharge Orders     None

## 2024-01-08 NOTE — ED Triage Notes (Signed)
 Pt has pressure ulcer on her right heel that she is seeing wound care for that is now draining yellow & green. Pt denies increase in pain, N/V/D, SOB.  Family states they are just concerned by the color change & amount of drainage. Wound dressed with bandage at time of triage.

## 2024-01-08 NOTE — Discharge Instructions (Signed)
 Please contact your podiatrist and wound center tomorrow for close follow-up.  We are starting you on antibiotics and pain medication.  Pain medication may cause constipation so please take a laxative if you are starting to get backed up.  Continue dressing and padding.  Return if any fevers or worsening symptoms.

## 2024-01-10 ENCOUNTER — Ambulatory Visit (HOSPITAL_COMMUNITY): Admitting: Physical Therapy

## 2024-01-10 DIAGNOSIS — E11621 Type 2 diabetes mellitus with foot ulcer: Secondary | ICD-10-CM

## 2024-01-10 DIAGNOSIS — L89223 Pressure ulcer of left hip, stage 3: Secondary | ICD-10-CM

## 2024-01-10 DIAGNOSIS — M79672 Pain in left foot: Secondary | ICD-10-CM

## 2024-01-10 DIAGNOSIS — M25552 Pain in left hip: Secondary | ICD-10-CM

## 2024-01-10 NOTE — Therapy (Signed)
 OUTPATIENT PHYSICAL THERAPY Wound  TREATMENT   Patient Name: Nicole [name] MRN: [id number] DOB:09-03-1965, 58 y.o., female Today's Date: 01/10/2024   PCP: Norleen Hurst REFERRING PROVIDER: Hyacinth Honey, NP  END OF SESSION:  PT End of Session - 01/10/24 1214     Visit Number 7    Number of Visits 16    Date for Recertification  01/19/24    Authorization Type Dual complete    PT Start Time 1018    PT Stop Time 1055    PT Time Calculation (min) 37 min    Activity Tolerance Patient tolerated treatment well    Behavior During Therapy [facility] for tasks assessed/performed          Past Medical History:  Diagnosis Date   CAP (community acquired pneumonia)    Streptococcus 01/2011   COPD (chronic obstructive pulmonary disease) (HCC)    Coronary atherosclerosis of native coronary artery    a. Diagnosed [place]  2006 - DES RCA, reports followup cath 2009 at [facility] b. reported 2 stents in [place]  in 2020. c. 07/2021: cath showing patent stents along RCA and mid-LAD with scattered 20% stenosis but no obstructive disease.   Dyslipidemia    Essential hypertension, benign    Morbid obesity (HCC)    Pancreatitis    Type 2 diabetes mellitus (HCC)    Past Surgical History:  Procedure Laterality Date   ABDOMINAL AORTOGRAM N/A 11/14/2023   Procedure: ABDOMINAL AORTOGRAM;  Surgeon: Sheree Penne Bruckner, MD;  Location: [facility] INVASIVE CV LAB;  Service: Cardiovascular;  Laterality: N/A;   ABDOMINAL HERNIA REPAIR     ABDOMINAL HYSTERECTOMY     AMPUTATION Left 01/25/2020   Procedure: LEFT BELOW KNEE AMPUTATION;  Surgeon: Harden Jerona GAILS, MD;  Location: [facility] OR;  Service: Orthopedics;  Laterality: Left;   CORONARY ANGIOPLASTY WITH STENT PLACEMENT  2006   KNEE SURGERY     LEFT HEART CATH AND CORONARY ANGIOGRAPHY N/A 08/14/2021   Procedure: LEFT HEART CATH AND CORONARY ANGIOGRAPHY;  Surgeon: Burnard Debby LABOR, MD;  Location: [facility] INVASIVE CV LAB;  Service: Cardiovascular;  Laterality: N/A;   LOWER  EXTREMITY ANGIOGRAPHY Right 11/14/2023   Procedure: Lower Extremity Angiography;  Surgeon: Sheree Penne Bruckner, MD;  Location: [facility] INVASIVE CV LAB;  Service: Cardiovascular;  Laterality: Right;   NOSE SURGERY     Pilonidal cystectomy     TONSILLECTOMY     Patient Active Problem List   Diagnosis Date Noted   Diabetic ulcer of right heel associated with type 2 diabetes mellitus (HCC) 11/10/2023   Pressure ulcer of left thigh 11/10/2023   Tremor 11/10/2023   Bilateral cataracts 09/09/2022   Essential tremor 09/09/2022   Trigger finger, unspecified finger 09/09/2022   Anxiety 12/30/2021   OSA on CPAP 12/09/2021   Atopic dermatitis 09/24/2021   Venous stasis ulcer of right lower leg with edema of right lower leg (HCC) 09/24/2021   Urinary incontinence 05/21/2021   Oxygen  dependent 05/14/2021   Chronic kidney disease due to type 2 diabetes mellitus (HCC) 04/16/2021   Subclinical hypothyroidism 04/15/2021   Hyperlipidemia 04/02/2021   Gastroesophageal reflux disease without esophagitis 04/02/2021   Osteoarthritis 04/02/2021   Morbid obesity (HCC) 04/02/2021   Hx of BKA, left (HCC) 09/09/2020   Vitamin D  deficiency 07/30/2020   Anxiety and depression 07/30/2020   Atrial fibrillation (HCC) 07/30/2020   Obesity, Class III, BMI 40-49.9 (morbid obesity) (HCC) 01/23/2020   Tobacco abuse--2 P/day Smoker 01/23/2020   Hypoalbuminemia due to protein-calorie malnutrition 01/22/2020   Uncontrolled  type 2 diabetes mellitus with hyperglycemia, with long-term current use of insulin  (HCC) 01/22/2020   Elevated alpha fetoprotein 01/22/2020   Diabetic ulcer of left foot (HCC) 12/05/2019   Heart failure, unspecified (HCC) 03/29/2019   TIA (transient ischemic attack) 04/10/2012   COPD (chronic obstructive pulmonary disease) (HCC) 11/09/2011   Coronary artery disease involving native coronary artery of native heart with angina pectoris 02/19/2011   Dyslipidemia    Type 2 diabetes mellitus with  obesity    Primary hypertension     ONSET DATE: chronic   REFERRING DIAG: L89.61  THERAPY DIAG:  E11.621 M79.671  Rationale for Evaluation and Treatment: Rehabilitation Pt recently admitted into the hospital  Admit date: 11/09/2023 Discharge date: 11/15/23  57 year old F with PMH of COPD, CAD, IDDM-2, OSA on CPAP but not working, A-fib not on [facility], morbid obesity, left BKA, HTN, tremor, HLD, GERD and prior tobacco use presented to [facility] with worsening right heel wound with pain, swelling pus drainage for about a week or 2, and admitted to [facility] with diabetic right heel ulcer after consultation with podiatry.  MRI negative for abscess or osteomyelitis but concerning for cellulitis.  ABI with mild disease.  VVS consulted.  Angiogram without significant finding.  Podiatry recommended p.o. Augmentin  and doxycycline  for 7 more days, wound care and outpatient follow-up. HX:  11/14/2023 Pre-operative Diagnosis: Right heel ulceration Post-operative diagnosis:  Same Surgeon:  Penne C. Sheree, MD Angiography performed with noted blockage of the iliac and femoral artery.  Not intervention was performed.  It was felt that the pt most likely needed but pt refused at this time.   Today's Treatment   01/10/24 0001  Subjective Assessment  Subjective PT daughter states that she had to take her mom to the ER due to her pain being so bad on her Lt heel  Patient and Family Stated Goals wounds to heal  Date of Onset 07/24/23  Prior Treatments self care, hospitalization and HH  Pain Assessment  Pain Scale 0-10  Pain Score 9 (Pt talking normal to pt with no outward signs of distress.)  Pain Type Chronic pain  Pain Location Heel  Pain Orientation Left  Pain Intervention(s) Emotional support  Evaluation and Treatment  Evaluation and Treatment Procedures Explained to Patient/Family Yes  Evaluation and Treatment Procedures agreed to  Wound 11/10/23 0337 Pressure Injury Hip  Left;Lateral  Date First Assessed/Time First Assessed: 11/10/23 0337   Present on Original Admission: Yes  Primary Wound Type: Pressure Injury  Location: Hip  Location Orientation: Left;Lateral  Wound Description (Comments): circular wound, minimal serous drainage....  Site / Wound Assessment Dry;Pink  Peri-wound Assessment Intact  Wound Length (cm) 1 cm  Wound Width (cm) 1 cm  Wound Surface Area (cm^2) 0.79 cm^2  Wound Depth (cm)  (unable to probe area today)  Drainage Description Serosanguineous  Drainage Amount Small  Treatment Cleansed  Dressing Type Gauze (Comment);ABD;Foam - Lift dressing to assess site every shift;Other (Comment)  Dressing Status Intact;Old drainage  % Wound base Red or Granulating 100%  % Wound base Yellow/Fibrinous Exudate 0%  Margins Unattached edges (unapproximated)  Wound 11/10/23 0339 Diabetic Ulcer Heel Posterior;Right  Date First Assessed/Time First Assessed: 11/10/23 0339   Present on Original Admission: Yes  Primary Wound Type: Diabetic Ulcer  Location: Heel  Location Orientation: Posterior;Right  Site / Wound Assessment Black;Yellow;Red  Peri-wound Assessment Erythema (blanchable)  Wound Depth (cm)  (unknown at this time)  Drainage Amount Small  Treatments Cleansed;Site care;Other (Comment) (debridement  and off loading)  Dressing Type Gauze (Comment) (debridement)  Dressing Status Dry;Intact  % Wound base Red or Granulating 10%  % Wound base Yellow/Fibrinous Exudate 45%  % Wound base Black/Eschar 45%  Selective Debridement (non-excisional)  Selective Debridement (non-excisional) - Location Lt heel  Selective Debridement (non-excisional) - Tools Used Forceps;Scalpel;Scissors  Selective Debridement (non-excisional) - Tissue Removed eschar and slough  Wound Therapy - Assess/Plan/Recommendations  Wound Therapy - Clinical Statement see above  Wound Therapy - Functional Problem List unable to weight bear  Factors Delaying/Impairing Wound Healing  Altered sensation;Diabetes Mellitus;Infection - systemic/local;Incontinence;Immobility;Multiple medical problems;Polypharmacy  Hydrotherapy Plan Debridement;Dressing change;Patient/family education;Other (comment)  Wound Therapy - Frequency 2X / week  Wound Therapy - Current Recommendations PT  Wound Plan See for debridement as needed as dressing change as needed.  Wound Therapy  Dressing  LT hip wound silverhydrofiber, 4x4, cut out of 1/4 foam and medipore tape.  Dressing Rt Heel:  vaseline to callous area, silver hydrofiber to wound due to wound now draining , un load with cut off of ab pad and 1/2 foam followed by kerlix, and netting     PATIENT EDUCATION: Education details: The importance of off loading as much as possible,  Keep dressing on unless it becomes painful or wet ; complete exercises  Person educated: Patient Education method: Explanation Education comprehension: verbalized understanding   HOME EXERCISE PROGRAM: Ankle pumps, toe crunches    GOALS: Goals reviewed with patient? No  SHORT TERM GOALS: Target date: 12/22/23  Wounds to be 100% granulated  Baseline: Goal status: IN PROGRESS  2.  Rt heel pain to be decreased to no higher than a 2/10 Baseline:  Goal status: IN PROGRESS  3. Family to be knowledgeable of the importance of off loading wound areas.  Baseline:  Goal status: IN PROGRESS   LONG TERM GOALS: Target date: 01/19/24  Wounds to be healed.  Baseline:  Goal status: IN PROGRESS  2.  PT to have no heel pain Baseline:  Goal status: IN PROGRESS  3.  PT family  to verbalize the importance of using diabetic shoes at all time, with daily foot inspection.  Baseline:  Goal status: IN PROGRESS  ASSESSMENT:  CLINICAL IMPRESSION: 12/23/23:  PT no longer has green drainage from her Lt hip. Eschar on Lt heel is softening and slowly lifting off with noted increased granulation.  PT will continue to benefit from skilled PT to debride eschar and  slough away from wounds to promote a healing environment.  OBJECTIVE IMPAIRMENTS: decreased mobility, impaired sensation, prosthetic dependency , obesity, pain, and decreased skin integrity.   ACTIVITY LIMITATIONS: transfers  PERSONAL FACTORS: Fitness, Past/current experiences, Time since onset of injury/illness/exacerbation, and 3+ comorbidities: DM, COPD, CAD  are also affecting patient's functional outcome.   REHAB POTENTIAL: Fair    CLINICAL DECISION MAKING: Evolving/moderate complexity  EVALUATION COMPLEXITY: High  PLAN: PT FREQUENCY: 2x/week  PT DURATION: 8 weeks  PLANNED INTERVENTIONS: 97110-Therapeutic exercises, 97535- Self Care, 02859- Manual therapy, 97598- Wound care (each additional 20 sq cm), and Patient/Family education  PLAN FOR NEXT SESSION:  continue with debridement and  wound care.   Montie Metro, PT CLT  [phone] 01/10/2024, 12:22 PM

## 2024-01-12 ENCOUNTER — Encounter: Payer: Self-pay | Admitting: Podiatry

## 2024-01-12 ENCOUNTER — Ambulatory Visit: Admitting: Podiatry

## 2024-01-12 ENCOUNTER — Ambulatory Visit (INDEPENDENT_AMBULATORY_CARE_PROVIDER_SITE_OTHER): Admitting: Podiatry

## 2024-01-12 DIAGNOSIS — L97412 Non-pressure chronic ulcer of right heel and midfoot with fat layer exposed: Secondary | ICD-10-CM

## 2024-01-12 DIAGNOSIS — E11621 Type 2 diabetes mellitus with foot ulcer: Secondary | ICD-10-CM | POA: Diagnosis not present

## 2024-01-12 NOTE — Progress Notes (Signed)
 Subjective:  Patient ID: Nicole [name], female    DOB: [birth year],  MRN: [id number]  Chief Complaint  Patient presents with   Wound Check    Right foot heel wound check. Taking antibiotics. 59 pain. Wearing surgical shoe.    58 y.o. female presents for follow-up on right heel ulceration.   Has been getting 2x weekly dressing change [facility]. Wearing surgical shoe.  Was recently seen in the emergency department at [facility] due to concern for wound infection was recommended to follow-up with us ..  Taking antibiotics.  Past Medical History:  Diagnosis Date   CAP (community acquired pneumonia)    Streptococcus 01/2011   COPD (chronic obstructive pulmonary disease) (HCC)    Coronary atherosclerosis of native coronary artery    a. Diagnosed [place]  2006 - DES RCA, reports followup cath 2009 at [facility] b. reported 2 stents in [place]  in 2020. c. 07/2021: cath showing patent stents along RCA and mid-LAD with scattered 20% stenosis but no obstructive disease.   Dyslipidemia    Essential hypertension, benign    Morbid obesity (HCC)    Pancreatitis    Type 2 diabetes mellitus (HCC)     Allergies  Allergen Reactions   Aspirin  Anaphylaxis and Other (See Comments)    Throat closing    Bee Venom Anaphylaxis   Pineapple Anaphylaxis   Definity  [Perflutren  Lipid Microsphere]     Muscle Aches   E-Mycin [Erythromycin Base] Nausea And Vomiting    ROS: Negative except as per HPI above  Objective:  General: AAO x3, NAD  Dermatological: Right heel with mild erythema surrounding the toe appears much improved from previous.  There is decreased hyperkeratotic tissue surrounding the wound there is some necrotic tissue in the central aspect of the wound with fibrotic and necrotic tissue present.  Minimal bleeding at debridement.    Vascular: DP and PT pulses nonpalpable, does have cap refill intact to digits  Neruologic: Grossly diminished to light touch to B  heel  Musculoskeletal: Prior BKA left lower extremity  Gait: Unassisted, Nonantalgic.    Radiographs:  MRI right foot with and without contrast 11/10/2023: 1. Soft tissue ulceration/wound at the medial heel with surrounding soft tissue edema, enhancement and cutaneous thickening, most pronounced along the anteromedial margin. These findings are compatible with cellulitis. No loculated fluid collection. 2. No evidence of osteomyelitis. Assessment:   1. Diabetic ulcer of right heel associated with type 2 diabetes mellitus, with fat layer exposed (HCC)     Plan:  Patient was evaluated and treated and all questions answered.  # Right heel ulceration secondary to diabetes and PAD - At this time the ulceration appears slightly worse following removal of the overlying eschar.  Evidence of necrotic tissue in the wound bed which I debrided lightly.  No significant evidence of infection erythema appears decreased from prior.  Still with concern for significant arterial disease which is limiting healing. - Recommend we continue local wound care as they have been doing.  Recommend continue 2-3x weekly dressing change with silver alginate to the wound cover with 4x4 gauze, kerlix and ace wrap.  -Considering hyperbaric oxygen  therapy if she would be a candidate -Continue doxycycline  until course finished - Weightbearing as tolerated in postop shoe for transfers short distances.  Prevalon boot use while sleeping - follow up in 3 weeks for wound check         [name], DPM [facility] / [facility]

## 2024-01-13 ENCOUNTER — Encounter (HOSPITAL_COMMUNITY): Payer: Self-pay

## 2024-01-13 ENCOUNTER — Ambulatory Visit (HOSPITAL_COMMUNITY)

## 2024-01-13 DIAGNOSIS — M79672 Pain in left foot: Secondary | ICD-10-CM

## 2024-01-13 DIAGNOSIS — E11621 Type 2 diabetes mellitus with foot ulcer: Secondary | ICD-10-CM

## 2024-01-13 DIAGNOSIS — M25552 Pain in left hip: Secondary | ICD-10-CM

## 2024-01-13 DIAGNOSIS — L89223 Pressure ulcer of left hip, stage 3: Secondary | ICD-10-CM

## 2024-01-13 NOTE — Therapy (Addendum)
 OUTPATIENT PHYSICAL THERAPY Wound  TREATMENT   Patient Name: Nicole [name] MRN: [id number] DOB:11/02/1965, 59 y.o., female Today's Date: 01/13/2024   PCP: Norleen Hurst REFERRING PROVIDER: Hyacinth Honey, NP  END OF SESSION:  PT End of Session - 01/13/24 1009     Visit Number 8    Number of Visits 16    Date for Recertification  01/19/24    Authorization Type Dual complete    PT Start Time 0910    PT Stop Time 0954    PT Time Calculation (min) 44 min    Activity Tolerance Patient tolerated treatment well    Behavior During Therapy [facility] for tasks assessed/performed          Past Medical History:  Diagnosis Date   CAP (community acquired pneumonia)    Streptococcus 01/2011   COPD (chronic obstructive pulmonary disease) (HCC)    Coronary atherosclerosis of native coronary artery    a. Diagnosed [place]  2006 - DES RCA, reports followup cath 2009 at [facility] b. reported 2 stents in [place]  in 2020. c. 07/2021: cath showing patent stents along RCA and mid-LAD with scattered 20% stenosis but no obstructive disease.   Dyslipidemia    Essential hypertension, benign    Morbid obesity (HCC)    Pancreatitis    Type 2 diabetes mellitus (HCC)    Past Surgical History:  Procedure Laterality Date   ABDOMINAL AORTOGRAM N/A 11/14/2023   Procedure: ABDOMINAL AORTOGRAM;  Surgeon: Sheree Penne Bruckner, MD;  Location: [facility] INVASIVE CV LAB;  Service: Cardiovascular;  Laterality: N/A;   ABDOMINAL HERNIA REPAIR     ABDOMINAL HYSTERECTOMY     AMPUTATION Left 01/25/2020   Procedure: LEFT BELOW KNEE AMPUTATION;  Surgeon: Harden Jerona GAILS, MD;  Location: [facility] OR;  Service: Orthopedics;  Laterality: Left;   CORONARY ANGIOPLASTY WITH STENT PLACEMENT  2006   KNEE SURGERY     LEFT HEART CATH AND CORONARY ANGIOGRAPHY N/A 08/14/2021   Procedure: LEFT HEART CATH AND CORONARY ANGIOGRAPHY;  Surgeon: Burnard Debby LABOR, MD;  Location: [facility] INVASIVE CV LAB;  Service: Cardiovascular;  Laterality: N/A;   LOWER  EXTREMITY ANGIOGRAPHY Right 11/14/2023   Procedure: Lower Extremity Angiography;  Surgeon: Sheree Penne Bruckner, MD;  Location: [facility] INVASIVE CV LAB;  Service: Cardiovascular;  Laterality: Right;   NOSE SURGERY     Pilonidal cystectomy     TONSILLECTOMY     Patient Active Problem List   Diagnosis Date Noted   Diabetic ulcer of right heel associated with type 2 diabetes mellitus (HCC) 11/10/2023   Pressure ulcer of left thigh 11/10/2023   Tremor 11/10/2023   Bilateral cataracts 09/09/2022   Essential tremor 09/09/2022   Trigger finger, unspecified finger 09/09/2022   Anxiety 12/30/2021   OSA on CPAP 12/09/2021   Atopic dermatitis 09/24/2021   Venous stasis ulcer of right lower leg with edema of right lower leg (HCC) 09/24/2021   Urinary incontinence 05/21/2021   Oxygen  dependent 05/14/2021   Chronic kidney disease due to type 2 diabetes mellitus (HCC) 04/16/2021   Subclinical hypothyroidism 04/15/2021   Hyperlipidemia 04/02/2021   Gastroesophageal reflux disease without esophagitis 04/02/2021   Osteoarthritis 04/02/2021   Morbid obesity (HCC) 04/02/2021   Hx of BKA, left (HCC) 09/09/2020   Vitamin D  deficiency 07/30/2020   Anxiety and depression 07/30/2020   Atrial fibrillation (HCC) 07/30/2020   Obesity, Class III, BMI 40-49.9 (morbid obesity) (HCC) 01/23/2020   Tobacco abuse--2 P/day Smoker 01/23/2020   Hypoalbuminemia due to protein-calorie malnutrition 01/22/2020   Uncontrolled  type 2 diabetes mellitus with hyperglycemia, with long-term current use of insulin  (HCC) 01/22/2020   Elevated alpha fetoprotein 01/22/2020   Diabetic ulcer of left foot (HCC) 12/05/2019   Heart failure, unspecified (HCC) 03/29/2019   TIA (transient ischemic attack) 04/10/2012   COPD (chronic obstructive pulmonary disease) (HCC) 11/09/2011   Coronary artery disease involving native coronary artery of native heart with angina pectoris 02/19/2011   Dyslipidemia    Type 2 diabetes mellitus with  obesity    Primary hypertension     ONSET DATE: chronic   REFERRING DIAG: L89.61  THERAPY DIAG:  E11.621 M79.671  Rationale for Evaluation and Treatment: Rehabilitation Pt recently admitted into the hospital  Admit date: 11/09/2023 Discharge date: 11/15/23  58 year old F with PMH of COPD, CAD, IDDM-2, OSA on CPAP but not working, A-fib not on [facility], morbid obesity, left BKA, HTN, tremor, HLD, GERD and prior tobacco use presented to [facility] [employer] with worsening right heel wound with pain, swelling pus drainage for about a week or 2, and admitted to [facility] with diabetic right heel ulcer after consultation with podiatry.  MRI negative for abscess or osteomyelitis but concerning for cellulitis.  ABI with mild disease.  VVS consulted.  Angiogram without significant finding.  Podiatry recommended p.o. Augmentin  and doxycycline  for 7 more days, wound care and outpatient follow-up. HX:  11/14/2023 Pre-operative Diagnosis: Right heel ulceration Post-operative diagnosis:  Same Surgeon:  Penne C. Sheree, MD Angiography performed with noted blockage of the iliac and femoral artery.  Not intervention was performed.  It was felt that the pt most likely needed but pt refused at this time.   Today's Treatment   Wound Therapy - 01/13/24 0001     Subjective Pt saw podiatrist yesterday, reports he did some debridement to heel.  Current pan scale 7/10 today.    Patient and Family Stated Goals wounds to heal    Date of Onset 07/24/23    Prior Treatments self care, hospitalization and HH    Pain Scale 0-10    Pain Score 7     Pain Type Chronic pain    Pain Location Heel    Pain Orientation Right    Pain Intervention(s) Emotional support;Repositioned    Evaluation and Treatment Procedures Explained to Patient/Family Yes    Evaluation and Treatment Procedures agreed to    Wound Properties Date First Assessed: 11/10/23 Time First Assessed: 0337 Present on Original Admission: Yes Primary Wound  Type: Pressure Injury Location: Hip Location Orientation: Left;Lateral Wound Description (Comments): circular wound, minimal serous drainage. pt being treated as outpatient by home health.   Wound Image Images linked: 1    Site / Wound Assessment Dry;Pink    Peri-wound Assessment Intact    Wound Length (cm) 0.6 cm    Wound Width (cm) 0.6 cm    Wound Surface Area (cm^2) 0.28 cm^2    Wound Depth (cm) --   unable to probe today   Drainage Description Serosanguineous    Drainage Amount Small    Treatment Cleansed    Dressing Type Gauze (Comment);Abdominal pads;Foam - Lift dressing to assess site every shift;Other (Comment)    Dressing Status Intact;Old drainage    % Wound base Red or Granulating 100%    % Wound base Yellow/Fibrinous Exudate 0%    Margins Unattached edges (unapproximated)    Wound Properties Date First Assessed: 11/10/23 Time First Assessed: 0339 Present on Original Admission: Yes Primary Wound Type: Diabetic Ulcer Location: Heel Location Orientation: Posterior;Right  Wound Image Images linked: 1    Site / Wound Assessment Black;Yellow;Red    Peri-wound Assessment Erythema (blanchable)    Wound Depth (cm) --   unknown at this time   Drainage Amount Moderate    Treatments Cleansed   debridement   Dressing Type Silver hydrofiber;Gauze (Comment)    Dressing Status Dry;Intact    % Wound base Red or Granulating 20%    % Wound base Yellow/Fibrinous Exudate 40%    % Wound base Black/Eschar 40%    Selective Debridement (non-excisional) - Location Lt heel    Selective Debridement (non-excisional) - Tools Used Forceps;Scalpel;Scissors    Selective Debridement (non-excisional) - Tissue Removed eschar and slough    Wound Therapy - Clinical Statement see above    Wound Therapy - Functional Problem List unable to weight bear    Factors Delaying/Impairing Wound Healing Altered sensation;Diabetes Mellitus;Infection - systemic/local;Incontinence;Immobility;Multiple medical  problems;Polypharmacy    Hydrotherapy Plan Debridement;Dressing change;Patient/family education;Other (comment)    Wound Therapy - Frequency 2X / week    Wound Therapy - Current Recommendations PT    Wound Plan See for debridement as needed as dressing change as needed.    Dressing  LT hip wound silverhydrofiber, 4x4, cut out of 1/4 foam and medipore tape.    Dressing Rt Heel:  vaseline to callous area, silver hydrofiber to wound due to wound now draining , un load with cut off of ab pad and 1/2 foam followed by kerlix, and netting             PATIENT EDUCATION: Education details: The importance of off loading as much as possible,  Keep dressing on unless it becomes painful or wet ; complete exercises  Person educated: Patient Education method: Explanation Education comprehension: verbalized understanding   HOME EXERCISE PROGRAM: Ankle pumps, toe crunches    GOALS: Goals reviewed with patient? No  SHORT TERM GOALS: Target date: 12/22/23  Wounds to be 100% granulated  Baseline: Goal status: IN PROGRESS  2.  Rt heel pain to be decreased to no higher than a 2/10 Baseline:  Goal status: IN PROGRESS  3. Family to be knowledgeable of the importance of off loading wound areas.  Baseline:  Goal status: IN PROGRESS   LONG TERM GOALS: Target date: 01/19/24  Wounds to be healed.  Baseline:  Goal status: IN PROGRESS  2.  PT to have no heel pain Baseline:  Goal status: IN PROGRESS  3.  PT family  to verbalize the importance of using diabetic shoes at all time, with daily foot inspection.  Baseline:  Goal status: IN PROGRESS  ASSESSMENT:  CLINICAL IMPRESSION: 01/13/24:  No green drainage and unable to find any undermining in hip today.  Able to debridement significant amount of eschar from Rt heel and dry skin perimeter of wound to promote healing.  Continued with siilver hydrofiber to address moderate drainage from heel.  Pt was limited by pain with debridement,  emotional support and repositioned for comfort through session.  Evaluation therapist viewed wounds this session, decreased frequency to 1x a week.  OBJECTIVE IMPAIRMENTS: decreased mobility, impaired sensation, prosthetic dependency , obesity, pain, and decreased skin integrity.   ACTIVITY LIMITATIONS: transfers  PERSONAL FACTORS: Fitness, Past/current experiences, Time since onset of injury/illness/exacerbation, and 3+ comorbidities: DM, COPD, CAD  are also affecting patient's functional outcome.   REHAB POTENTIAL: Fair    CLINICAL DECISION MAKING: Evolving/moderate complexity  EVALUATION COMPLEXITY: High  PLAN: PT FREQUENCY: 1x/week  PT DURATION: 8 weeks  PLANNED INTERVENTIONS:  97110-Therapeutic exercises, 97535- Self Care, 02859- Manual therapy, 97598- Wound care (each additional 20 sq cm), and Patient/Family education  PLAN FOR NEXT SESSION:  continue with debridement and  wound care.   Augustin Mclean, LPTA/CLT; [facility] [phone]  [phone] 01/13/2024, 10:10 AM

## 2024-01-17 ENCOUNTER — Other Ambulatory Visit: Payer: Self-pay | Admitting: Podiatry

## 2024-01-17 ENCOUNTER — Ambulatory Visit (HOSPITAL_COMMUNITY): Admitting: Physical Therapy

## 2024-01-17 DIAGNOSIS — M79672 Pain in left foot: Secondary | ICD-10-CM

## 2024-01-17 DIAGNOSIS — E11621 Type 2 diabetes mellitus with foot ulcer: Secondary | ICD-10-CM

## 2024-01-17 DIAGNOSIS — L89223 Pressure ulcer of left hip, stage 3: Secondary | ICD-10-CM | POA: Diagnosis not present

## 2024-01-17 NOTE — Addendum Note (Signed)
 Addended by: NELWYN MONTIE PARAS on: 01/17/2024 12:42 PM   Modules accepted: Orders

## 2024-01-17 NOTE — Therapy (Signed)
 OUTPATIENT PHYSICAL THERAPY Wound  TREATMENT   Patient Name: Nicole [name] MRN: [id number] DOB:[birth year], 58 y.o., female Today's Date: 01/17/2024   PCP: Norleen Hurst REFERRING PROVIDER: Hyacinth Honey, NP  END OF SESSION:  PT End of Session - 01/17/24 1206     Visit Number 9    Number of Visits 16    Date for Recertification  02/28/24    Authorization Type Dual complete    PT Start Time 1001    PT Stop Time 1043    PT Time Calculation (min) 42 min           Past Medical History:  Diagnosis Date   CAP (community acquired pneumonia)    Streptococcus 01/2011   COPD (chronic obstructive pulmonary disease) (HCC)    Coronary atherosclerosis of native coronary artery    a. Diagnosed [place]  2006 - DES RCA, reports followup cath 2009 at [facility] b. reported 2 stents in [place]  in 2020. c. 07/2021: cath showing patent stents along RCA and mid-LAD with scattered 20% stenosis but no obstructive disease.   Dyslipidemia    Essential hypertension, benign    Morbid obesity (HCC)    Pancreatitis    Type 2 diabetes mellitus (HCC)    Past Surgical History:  Procedure Laterality Date   ABDOMINAL AORTOGRAM N/A 11/14/2023   Procedure: ABDOMINAL AORTOGRAM;  Surgeon: Sheree Penne Bruckner, MD;  Location: [facility] INVASIVE CV LAB;  Service: Cardiovascular;  Laterality: N/A;   ABDOMINAL HERNIA REPAIR     ABDOMINAL HYSTERECTOMY     AMPUTATION Left 01/25/2020   Procedure: LEFT BELOW KNEE AMPUTATION;  Surgeon: Harden Jerona GAILS, MD;  Location: [facility] OR;  Service: Orthopedics;  Laterality: Left;   CORONARY ANGIOPLASTY WITH STENT PLACEMENT  2006   KNEE SURGERY     LEFT HEART CATH AND CORONARY ANGIOGRAPHY N/A 08/14/2021   Procedure: LEFT HEART CATH AND CORONARY ANGIOGRAPHY;  Surgeon: Burnard Debby LABOR, MD;  Location: [facility] INVASIVE CV LAB;  Service: Cardiovascular;  Laterality: N/A;   LOWER EXTREMITY ANGIOGRAPHY Right 11/14/2023   Procedure: Lower Extremity Angiography;  Surgeon: Sheree Penne Bruckner,  MD;  Location: [facility] INVASIVE CV LAB;  Service: Cardiovascular;  Laterality: Right;   NOSE SURGERY     Pilonidal cystectomy     TONSILLECTOMY     Patient Active Problem List   Diagnosis Date Noted   Diabetic ulcer of right heel associated with type 2 diabetes mellitus (HCC) 11/10/2023   Pressure ulcer of left thigh 11/10/2023   Tremor 11/10/2023   Bilateral cataracts 09/09/2022   Essential tremor 09/09/2022   Trigger finger, unspecified finger 09/09/2022   Anxiety 12/30/2021   OSA on CPAP 12/09/2021   Atopic dermatitis 09/24/2021   Venous stasis ulcer of right lower leg with edema of right lower leg (HCC) 09/24/2021   Urinary incontinence 05/21/2021   Oxygen  dependent 05/14/2021   Chronic kidney disease due to type 2 diabetes mellitus (HCC) 04/16/2021   Subclinical hypothyroidism 04/15/2021   Hyperlipidemia 04/02/2021   Gastroesophageal reflux disease without esophagitis 04/02/2021   Osteoarthritis 04/02/2021   Morbid obesity (HCC) 04/02/2021   Hx of BKA, left (HCC) 09/09/2020   Vitamin D  deficiency 07/30/2020   Anxiety and depression 07/30/2020   Atrial fibrillation (HCC) 07/30/2020   Obesity, Class III, BMI 40-49.9 (morbid obesity) (HCC) 01/23/2020   Tobacco abuse--2 P/day Smoker 01/23/2020   Hypoalbuminemia due to protein-calorie malnutrition 01/22/2020   Uncontrolled type 2 diabetes mellitus with hyperglycemia, with long-term current use of insulin  (HCC) 01/22/2020   Elevated alpha  fetoprotein 01/22/2020   Diabetic ulcer of left foot (HCC) 12/05/2019   Heart failure, unspecified (HCC) 03/29/2019   TIA (transient ischemic attack) 04/10/2012   COPD (chronic obstructive pulmonary disease) (HCC) 11/09/2011   Coronary artery disease involving native coronary artery of native heart with angina pectoris 02/19/2011   Dyslipidemia    Type 2 diabetes mellitus with obesity    Primary hypertension     ONSET DATE: chronic   REFERRING DIAG: L89.61  THERAPY DIAG:   E11.621 M79.671  Rationale for Evaluation and Treatment: Rehabilitation Pt recently admitted into the hospital  Admit date: 11/09/2023 Discharge date: 11/15/23  58 year old F with PMH of COPD, CAD, IDDM-2, OSA on CPAP but not working, A-fib not on [facility], morbid obesity, left BKA, HTN, tremor, HLD, GERD and prior tobacco use presented to [facility] with worsening right heel wound with pain, swelling pus drainage for about a week or 2, and admitted to [facility] with diabetic right heel ulcer after consultation with podiatry.  MRI negative for abscess or osteomyelitis but concerning for cellulitis.  ABI with mild disease.  VVS consulted.  Angiogram without significant finding.  Podiatry recommended p.o. Augmentin  and doxycycline  for 7 more days, wound care and outpatient follow-up. HX:  11/14/2023 Pre-operative Diagnosis: Right heel ulceration Post-operative diagnosis:  Same Surgeon:  Penne C. Sheree, MD Angiography performed with noted blockage of the iliac and femoral artery.  Not intervention was performed.  It was felt that the pt most likely needed but pt refused at this time.   Today's Treatment   Wound Therapy - 01/17/24 0001     Subjective Pt states at this instant she has no pain .    Patient and Family Stated Goals wounds to heal    Date of Onset 07/24/23    Prior Treatments self care, hospitalization and HH    Pain Scale 0-10    Pain Score 0-No pain    Evaluation and Treatment Procedures Explained to Patient/Family Yes    Evaluation and Treatment Procedures agreed to    Wound Properties Date First Assessed: 11/10/23 Time First Assessed: 0337 Present on Original Admission: Yes Primary Wound Type: Pressure Injury Location: Hip Location Orientation: Left;Lateral Wound Description (Comments): circular wound, minimal serous drainage. pt being treated as outpatient by home health.   Site / Wound Assessment Dry;Pink    Peri-wound Assessment Intact    Wound Length (cm) 0.3 cm     Wound Width (cm) 0.3 cm    Wound Surface Area (cm^2) 0.07 cm^2    Drainage Description Serosanguineous    Drainage Amount Small    Treatment Cleansed    Dressing Type Gauze (Comment);Abdominal pads;Foam - Lift dressing to assess site every shift;Other (Comment)    Dressing Status Intact;Old drainage    % Wound base Red or Granulating 100%    % Wound base Yellow/Fibrinous Exudate 0%    Margins Unattached edges (unapproximated)    Wound Properties Date First Assessed: 11/10/23 Time First Assessed: 0339 Present on Original Admission: Yes Primary Wound Type: Diabetic Ulcer Location: Heel Location Orientation: Posterior;Right   Site / Wound Assessment Black;Yellow;Red    Peri-wound Assessment Erythema (blanchable)    Wound Length (cm) 4 cm    Wound Width (cm) 3.5 cm    Wound Surface Area (cm^2) 11 cm^2    Wound Depth (cm) --   unknon at this time   Drainage Amount Moderate    Dressing Type Silver hydrofiber;Gauze (Comment)    Dressing Status Dry;Intact    %  Wound base Red or Granulating 20%    % Wound base Yellow/Fibrinous Exudate 40%    % Wound base Black/Eschar 40%    Selective Debridement (non-excisional) - Location Lt heel    Selective Debridement (non-excisional) - Tools Used Forceps;Scalpel;Scissors    Selective Debridement (non-excisional) - Tissue Removed eschar and slough    Wound Therapy - Clinical Statement see above    Wound Therapy - Functional Problem List unable to weight bear    Factors Delaying/Impairing Wound Healing Altered sensation;Diabetes Mellitus;Infection - systemic/local;Incontinence;Immobility;Multiple medical problems;Polypharmacy    Hydrotherapy Plan Debridement;Dressing change;Patient/family education;Other (comment)    Wound Therapy - Frequency 2X / week    Wound Therapy - Current Recommendations PT    Wound Plan See for debridement as needed as dressing change as needed.    Dressing  LT hip wound silverhydrofiber, 4x4, cut out of 1/4 foam and medipore  tape.    Dressing Rt Heel:  vaseline to callous area, silver hydrofiber to wound due to wound now draining , un load with cut off of ab pad and 1/2 foam followed by kerlix, and netting              PATIENT EDUCATION: Education details: The importance of off loading as much as possible,  Keep dressing on unless it becomes painful or wet ; complete exercises  Person educated: Patient Education method: Explanation Education comprehension: verbalized understanding   HOME EXERCISE PROGRAM: Ankle pumps, toe crunches    GOALS: Goals reviewed with patient? No  SHORT TERM GOALS: Target date: 12/22/23  Wounds to be 100% granulated  Baseline: Goal status: met for hip; in progress for heel   2.  Rt heel pain to be decreased to no higher than a 2/10 Baseline:  Goal status: IN PROGRESS  3. Family to be knowledgeable of the importance of off loading wound areas.  Baseline:  Goal status: MET   LONG TERM GOALS: Target date: 01/19/24  Wounds to be healed.  Baseline:  Goal status: IN PROGRESS  2.  PT to have no heel pain Baseline:  Goal status: IN PROGRESS  3.  PT family  to verbalize the importance of using diabetic shoes at all time, with daily foot inspection.  Baseline:  Goal status: IN PROGRESS  ASSESSMENT:  CLINICAL IMPRESSION:  LT hip wound is 100% granulated needing no debridement there is no tunneling of Lt hip.    Continued with siilver hydrofiber to both areas; hip to address hypergranulation, and to address moderate drainage from heel.  Pt will be followed once a week for debridement of heel  OBJECTIVE IMPAIRMENTS: decreased mobility, impaired sensation, prosthetic dependency , obesity, pain, and decreased skin integrity.   ACTIVITY LIMITATIONS: transfers  PERSONAL FACTORS: Fitness, Past/current experiences, Time since onset of injury/illness/exacerbation, and 3+ comorbidities: DM, COPD, CAD  are also affecting patient's functional outcome.   REHAB POTENTIAL:  Fair    CLINICAL DECISION MAKING: Evolving/moderate complexity  EVALUATION COMPLEXITY: High  PLAN: PT FREQUENCY: 1x/week  PT DURATION: 8 weeks  PLANNED INTERVENTIONS: 97110-Therapeutic exercises, 97535- Self Care, 02859- Manual therapy, 97598- Wound care (each additional 20 sq cm), and Patient/Family education  PLAN FOR NEXT SESSION:  continue with debridement and  wound care.   Montie Metro, PT CLT [phone] 01/17/2024, 12:22 PM

## 2024-01-23 ENCOUNTER — Encounter (HOSPITAL_COMMUNITY): Payer: Self-pay

## 2024-01-23 ENCOUNTER — Ambulatory Visit (HOSPITAL_COMMUNITY): Attending: Family Medicine

## 2024-01-23 DIAGNOSIS — L89223 Pressure ulcer of left hip, stage 3: Secondary | ICD-10-CM | POA: Insufficient documentation

## 2024-01-23 DIAGNOSIS — E11621 Type 2 diabetes mellitus with foot ulcer: Secondary | ICD-10-CM | POA: Insufficient documentation

## 2024-01-23 DIAGNOSIS — M25552 Pain in left hip: Secondary | ICD-10-CM | POA: Insufficient documentation

## 2024-01-23 DIAGNOSIS — L97428 Non-pressure chronic ulcer of left heel and midfoot with other specified severity: Secondary | ICD-10-CM | POA: Insufficient documentation

## 2024-01-23 DIAGNOSIS — M79672 Pain in left foot: Secondary | ICD-10-CM | POA: Insufficient documentation

## 2024-01-23 NOTE — Therapy (Signed)
 OUTPATIENT PHYSICAL THERAPY Wound  TREATMENT  10th visit Progress Note Reporting Period 11/24/23 to 01/23/24  See note below for Objective Data and Assessment of Progress/Goals.      Patient Name: Nicole [name] MRN: [id number] DOB:Jul 22, 1965, 58 y.o., female Today's Date: 01/23/2024   PCP: Norleen Hurst REFERRING PROVIDER: Hyacinth Honey, NP  END OF SESSION:  PT End of Session - 01/23/24 1123     Visit Number 10    Number of Visits 16    Date for Recertification  02/28/24    Authorization Type Dual complete    PT Start Time 0910    PT Stop Time 1000    PT Time Calculation (min) 50 min    Activity Tolerance Patient tolerated treatment well    Behavior During Therapy [facility] for tasks assessed/performed           Past Medical History:  Diagnosis Date   CAP (community acquired pneumonia)    Streptococcus 01/2011   COPD (chronic obstructive pulmonary disease) (HCC)    Coronary atherosclerosis of native coronary artery    a. Diagnosed [place]  2006 - DES RCA, reports followup cath 2009 at [facility] b. reported 2 stents in [place]  in 2020. c. 07/2021: cath showing patent stents along RCA and mid-LAD with scattered 20% stenosis but no obstructive disease.   Dyslipidemia    Essential hypertension, benign    Morbid obesity (HCC)    Pancreatitis    Type 2 diabetes mellitus (HCC)    Past Surgical History:  Procedure Laterality Date   ABDOMINAL AORTOGRAM N/A 11/14/2023   Procedure: ABDOMINAL AORTOGRAM;  Surgeon: Sheree Penne Bruckner, MD;  Location: [facility] INVASIVE CV LAB;  Service: Cardiovascular;  Laterality: N/A;   ABDOMINAL HERNIA REPAIR     ABDOMINAL HYSTERECTOMY     AMPUTATION Left 01/25/2020   Procedure: LEFT BELOW KNEE AMPUTATION;  Surgeon: Harden Jerona GAILS, MD;  Location: [facility] OR;  Service: Orthopedics;  Laterality: Left;   CORONARY ANGIOPLASTY WITH STENT PLACEMENT  2006   KNEE SURGERY     LEFT HEART CATH AND CORONARY ANGIOGRAPHY N/A 08/14/2021   Procedure: LEFT HEART CATH  AND CORONARY ANGIOGRAPHY;  Surgeon: Burnard Debby LABOR, MD;  Location: [facility] INVASIVE CV LAB;  Service: Cardiovascular;  Laterality: N/A;   LOWER EXTREMITY ANGIOGRAPHY Right 11/14/2023   Procedure: Lower Extremity Angiography;  Surgeon: Sheree Penne Bruckner, MD;  Location: [facility] INVASIVE CV LAB;  Service: Cardiovascular;  Laterality: Right;   NOSE SURGERY     Pilonidal cystectomy     TONSILLECTOMY     Patient Active Problem List   Diagnosis Date Noted   Diabetic ulcer of right heel associated with type 2 diabetes mellitus (HCC) 11/10/2023   Pressure ulcer of left thigh 11/10/2023   Tremor 11/10/2023   Bilateral cataracts 09/09/2022   Essential tremor 09/09/2022   Trigger finger, unspecified finger 09/09/2022   Anxiety 12/30/2021   OSA on CPAP 12/09/2021   Atopic dermatitis 09/24/2021   Venous stasis ulcer of right lower leg with edema of right lower leg (HCC) 09/24/2021   Urinary incontinence 05/21/2021   Oxygen  dependent 05/14/2021   Chronic kidney disease due to type 2 diabetes mellitus (HCC) 04/16/2021   Subclinical hypothyroidism 04/15/2021   Hyperlipidemia 04/02/2021   Gastroesophageal reflux disease without esophagitis 04/02/2021   Osteoarthritis 04/02/2021   Morbid obesity (HCC) 04/02/2021   Hx of BKA, left (HCC) 09/09/2020   Vitamin D  deficiency 07/30/2020   Anxiety and depression 07/30/2020   Atrial fibrillation (HCC) 07/30/2020   Obesity, Class  III, BMI 40-49.9 (morbid obesity) (HCC) 01/23/2020   Tobacco abuse--2 P/day Smoker 01/23/2020   Hypoalbuminemia due to protein-calorie malnutrition 01/22/2020   Uncontrolled type 2 diabetes mellitus with hyperglycemia, with long-term current use of insulin  (HCC) 01/22/2020   Elevated alpha fetoprotein 01/22/2020   Diabetic ulcer of left foot (HCC) 12/05/2019   Heart failure, unspecified (HCC) 03/29/2019   TIA (transient ischemic attack) 04/10/2012   COPD (chronic obstructive pulmonary disease) (HCC) 11/09/2011   Coronary artery  disease involving native coronary artery of native heart with angina pectoris 02/19/2011   Dyslipidemia    Type 2 diabetes mellitus with obesity    Primary hypertension     ONSET DATE: chronic   REFERRING DIAG: L89.61  THERAPY DIAG:  E11.621 M79.671  Rationale for Evaluation and Treatment: Rehabilitation Pt recently admitted into the hospital  Admit date: 11/09/2023 Discharge date: 11/15/23  58 year old F with PMH of COPD, CAD, IDDM-2, OSA on CPAP but not working, A-fib not on [facility], morbid obesity, left BKA, HTN, tremor, HLD, GERD and prior tobacco use presented to [facility] with worsening right heel wound with pain, swelling pus drainage for about a week or 2, and admitted to [facility] with diabetic right heel ulcer after consultation with podiatry.  MRI negative for abscess or osteomyelitis but concerning for cellulitis.  ABI with mild disease.  VVS consulted.  Angiogram without significant finding.  Podiatry recommended p.o. Augmentin  and doxycycline  for 7 more days, wound care and outpatient follow-up. HX:  11/14/2023 Pre-operative Diagnosis: Right heel ulceration Post-operative diagnosis:  Same Surgeon:  Penne C. Sheree, MD Angiography performed with noted blockage of the iliac and femoral artery.  Not intervention was performed.  It was felt that the pt most likely needed but pt refused at this time.   Today's Treatment   Wound Therapy - 01/23/24 0001     Subjective Pain scale 7/10 Rt heel pain.  Arrived with new dressings changed on Saturday due to drainage.    Patient and Family Stated Goals wounds to heal    Date of Onset 07/24/23    Prior Treatments self care, hospitalization and HH    Pain Scale 0-10    Pain Score 7     Pain Type Chronic pain    Pain Location Heel    Pain Orientation Right    Pain Intervention(s) Repositioned;Emotional support    Evaluation and Treatment Procedures Explained to Patient/Family Yes    Evaluation and Treatment Procedures  agreed to    Wound Properties Date First Assessed: 11/10/23 Time First Assessed: 0337 Present on Original Admission: Yes Primary Wound Type: Pressure Injury Location: Hip Location Orientation: Left;Lateral Wound Description (Comments): circular wound, minimal serous drainage. pt being treated as outpatient by home health.   Wound Image   Media Information  Document Information  Photos  Left hip wound  01/23/2024 00:01  Attached To:  Outpatient Rehab on 01/23/24 with Renae Augustin PARAS, PTA  Source Information  Renae Augustin PARAS, PTA  [facility]     Site / Wound Assessment Dry;Pink    Peri-wound Assessment Intact    Wound Length (cm) 0.3 cm    Wound Width (cm) 0.3 cm    Wound Surface Area (cm^2) 0.07 cm^2    Drainage Description Serosanguineous    Drainage Amount Small    Treatment Cleansed    Dressing Type Gauze (Comment);Abdominal pads;Foam - Lift dressing to assess site every shift;Other (Comment)   Gauze, ABD pad, medipore tape   Dressing Status Intact;Old  drainage    % Wound base Red or Granulating 100%    % Wound base Yellow/Fibrinous Exudate 0%    Margins Unattached edges (unapproximated)    Wound Properties Date First Assessed: 11/10/23 Time First Assessed: 0339 Present on Original Admission: Yes Primary Wound Type: Diabetic Ulcer Location: Heel Location Orientation: Posterior;Right   Wound Image   Media Information  Document Information  Photos  Right he wound  01/23/2024 00:01  Attached To:  Outpatient Rehab on 01/23/24 with Renae Augustin PARAS, PTA  Source Information  Renae Augustin PARAS, PTA  [facility]     Site / Wound Assessment Black;Yellow;Red    Peri-wound Assessment Erythema (blanchable)    Wound Length (cm) 4 cm    Wound Width (cm) 3.5 cm    Wound Surface Area (cm^2) 11 cm^2    Wound Depth (cm) --   unknown   Drainage Amount Moderate    Treatments Cleansed;Moisturizing cream   Cleansed, selective debridement, dressing change with  forceps and scalpel   Dressing Type Silver hydrofiber;Abdominal pads;Gauze (Comment)    Dressing Status Dry;Intact    % Wound base Red or Granulating 40%    % Wound base Yellow/Fibrinous Exudate 25%    % Wound base Black/Eschar 35%    Selective Debridement (non-excisional) - Location Rt heel    Selective Debridement (non-excisional) - Tools Used Forceps;Scalpel;Scissors    Selective Debridement (non-excisional) - Tissue Removed eschar and slough    Wound Therapy - Clinical Statement see above    Wound Therapy - Functional Problem List unable to weight bear    Factors Delaying/Impairing Wound Healing Altered sensation;Diabetes Mellitus;Infection - systemic/local;Incontinence;Immobility;Multiple medical problems;Polypharmacy    Hydrotherapy Plan Debridement;Dressing change;Patient/family education;Other (comment)    Wound Therapy - Frequency 2X / week    Wound Therapy - Current Recommendations PT    Wound Plan See for debridement as needed as dressing change as needed.    Dressing  LT hip wound silverhydrofiber, 4x4, cut out of 1/4 foam and medipore tape.    Dressing Rt Heel:  vaseline to callous area, silver hydrofiber to wound due to wound now draining , un load with cut off of ab pad and 1/2 foam followed by kerlix, and netting              PATIENT EDUCATION: Education details: The importance of off loading as much as possible,  Keep dressing on unless it becomes painful or wet ; complete exercises  Person educated: Patient Education method: Explanation Education comprehension: verbalized understanding   HOME EXERCISE PROGRAM: Ankle pumps, toe crunches    GOALS: Goals reviewed with patient? No  SHORT TERM GOALS: Target date: 12/22/23  Wounds to be 100% granulated  Baseline: Goal status: met for hip; in progress for heel   2.  Rt heel pain to be decreased to no higher than a 2/10 Baseline:  Goal status: IN PROGRESS  3. Family to be knowledgeable of the importance  of off loading wound areas.  Baseline:  Goal status: MET   LONG TERM GOALS: Target date: 01/19/24  Wounds to be healed.  Baseline:  Goal status: IN PROGRESS  2.  PT to have no heel pain Baseline:  Goal status: IN PROGRESS  3.  PT family  to verbalize the importance of using diabetic shoes at all time, with daily foot inspection.  Baseline:  Goal status: IN PROGRESS  ASSESSMENT:  CLINICAL IMPRESSION: Able to remove significant amount of dry skin and eschar perimeter of wound with selective debridement.  Measured and photos taken of each wound.  No selective debridement necessary on hip, just cleansed and measured.  Continued with silverhydrofiber to both hip and heel to address drainage.  Decreased hypergranulation on Lt hip noted.   OBJECTIVE IMPAIRMENTS: decreased mobility, impaired sensation, prosthetic dependency , obesity, pain, and decreased skin integrity.   ACTIVITY LIMITATIONS: transfers  PERSONAL FACTORS: Fitness, Past/current experiences, Time since onset of injury/illness/exacerbation, and 3+ comorbidities: DM, COPD, CAD  are also affecting patient's functional outcome.   REHAB POTENTIAL: Fair    CLINICAL DECISION MAKING: Evolving/moderate complexity  EVALUATION COMPLEXITY: High  PLAN: PT FREQUENCY: 1x/week  PT DURATION: 8 weeks  PLANNED INTERVENTIONS: 97110-Therapeutic exercises, 97535- Self Care, 02859- Manual therapy, 97598- Wound care (each additional 20 sq cm), and Patient/Family education  PLAN FOR NEXT SESSION:  continue with debridement and  wound care.   Augustin Mclean, LPTA/CLT; CBIS [phone] Montie Metro, PT CLT [phone]   01/23/2024, 11:30 AM

## 2024-01-31 ENCOUNTER — Ambulatory Visit (HOSPITAL_COMMUNITY): Admitting: Physical Therapy

## 2024-02-02 ENCOUNTER — Ambulatory Visit (INDEPENDENT_AMBULATORY_CARE_PROVIDER_SITE_OTHER): Admitting: Podiatry

## 2024-02-02 ENCOUNTER — Ambulatory Visit (INDEPENDENT_AMBULATORY_CARE_PROVIDER_SITE_OTHER)

## 2024-02-02 ENCOUNTER — Encounter: Payer: Self-pay | Admitting: Podiatry

## 2024-02-02 DIAGNOSIS — Z89512 Acquired absence of left leg below knee: Secondary | ICD-10-CM | POA: Diagnosis not present

## 2024-02-02 DIAGNOSIS — L97412 Non-pressure chronic ulcer of right heel and midfoot with fat layer exposed: Secondary | ICD-10-CM

## 2024-02-02 DIAGNOSIS — E11621 Type 2 diabetes mellitus with foot ulcer: Secondary | ICD-10-CM

## 2024-02-02 NOTE — Progress Notes (Signed)
 Subjective:  Patient ID: Nicole [name], female    DOB: 04-12-1965,  MRN: [id number]  Chief Complaint  Patient presents with   Foot Ulcer    F/U Right heel ulceration secondary to diabetes and PAD. Cancelled wound care visit on 01/30/24 due to not wanting to follow doctors instructions on care. IDDDM 13.2 A1C      58 y.o. female presents for follow-up on right heel ulceration.   Reports she has been getting twice weekly dressing changes with dry gauze and gauze roll.  When the dressing is changed at home she does get silver alginate contact layer as recommended.  Concerned that the wound care center has been debriding the central eschar against prior recommendation.  They have discontinued going to the wound care center and instead prefer to follow-up with me or home health.  She has a daughter who can do the dressings twice weekly.  Past Medical History:  Diagnosis Date   CAP (community acquired pneumonia)    Streptococcus 01/2011   COPD (chronic obstructive pulmonary disease) (HCC)    Coronary atherosclerosis of native coronary artery    a. Diagnosed [place]  2006 - DES RCA, reports followup cath 2009 at [facility] b. reported 2 stents in [place]  in 2020. c. 07/2021: cath showing patent stents along RCA and mid-LAD with scattered 20% stenosis but no obstructive disease.   Dyslipidemia    Essential hypertension, benign    Morbid obesity (HCC)    Pancreatitis    Type 2 diabetes mellitus (HCC)     Allergies  Allergen Reactions   Aspirin  Anaphylaxis and Other (See Comments)    Throat closing    Bee Venom Anaphylaxis   Pineapple Anaphylaxis   Definity  [Perflutren  Lipid Microsphere]     Muscle Aches   E-Mycin [Erythromycin Base] Nausea And Vomiting    ROS: Negative except as per HPI above  Objective:  General: AAO x3, NAD  Dermatological: Right heel wound does appear slightly improved from prior to the central eschar appears firm and stable unable to assess depth underneath that  however there is no obvious evidence of infection.  Surrounding skin appears relatively healthy with some granular tissue and overlying hyperkeratotic dry skin which I debrided.  Appears to be healing in circumferentially       Vascular: DP and PT pulses nonpalpable, does have cap refill intact to digits  Neruologic: Grossly diminished to light touch to B heel  Musculoskeletal: Prior BKA left lower extremity  Gait: Unassisted, Nonantalgic.    Radiographs:  MRI right foot with and without contrast 11/10/2023: 1. Soft tissue ulceration/wound at the medial heel with surrounding soft tissue edema, enhancement and cutaneous thickening, most pronounced along the anteromedial margin. These findings are compatible with cellulitis. No loculated fluid collection. 2. No evidence of osteomyelitis. Assessment:   1. Diabetic ulcer of right heel associated with type 2 diabetes mellitus, with fat layer exposed (HCC)   2. History of amputation of leg through tibia and fibula, left (HCC)      Plan:  Patient was evaluated and treated and all questions answered.  # Right heel ulceration secondary to diabetes and PAD - At this time the ulceration appears to be slightly improved from previous. -I lightly debrided some peeling dry skin eschar from around the central firm eschar.  This should remain intact. - Recommend we continue local wound care as they have been doing.  Recommend continue 2-3x weekly dressing change with wound wash spray to clean the wound and then cover with  silver alginate to the wound cover with 4x4 gauze, kerlix and ace wrap.  - Will see if we can order supplies for twice weekly home dressing changes as above - No indication for antibiotics no evidence of infection - Weightbearing as tolerated in postop shoe for transfers short distances.  Prevalon boot use while sleeping - follow up in 3 weeks for wound check         [name], DPM [facility] /  [facility]

## 2024-02-07 ENCOUNTER — Ambulatory Visit (HOSPITAL_COMMUNITY): Admitting: Physical Therapy

## 2024-02-13 ENCOUNTER — Other Ambulatory Visit: Payer: Self-pay | Admitting: Podiatry

## 2024-02-13 ENCOUNTER — Encounter: Payer: Self-pay | Admitting: Podiatry

## 2024-02-13 MED ORDER — AMOXICILLIN-POT CLAVULANATE 875-125 MG PO TABS: 1.0000 | ORAL_TABLET | Freq: Two times a day (BID) | ORAL | 0 refills | Status: DC

## 2024-02-13 NOTE — Progress Notes (Signed)
 Rx for augmentin  sent due to peri wound erythema

## 2024-02-14 ENCOUNTER — Inpatient Hospital Stay (HOSPITAL_COMMUNITY): Admission: EM | Admit: 2024-02-14 | Discharge: 2024-02-24 | DRG: 623 | Disposition: A | Discharge status: Home Health

## 2024-02-14 ENCOUNTER — Encounter (HOSPITAL_COMMUNITY): Payer: Self-pay

## 2024-02-14 ENCOUNTER — Ambulatory Visit: Admitting: Urology

## 2024-02-14 ENCOUNTER — Other Ambulatory Visit: Payer: Self-pay

## 2024-02-14 DIAGNOSIS — I2510 Atherosclerotic heart disease of native coronary artery without angina pectoris: Secondary | ICD-10-CM | POA: Diagnosis present

## 2024-02-14 DIAGNOSIS — B957 Other staphylococcus as the cause of diseases classified elsewhere: Secondary | ICD-10-CM | POA: Diagnosis present

## 2024-02-14 DIAGNOSIS — J449 Chronic obstructive pulmonary disease, unspecified: Secondary | ICD-10-CM | POA: Diagnosis present

## 2024-02-14 DIAGNOSIS — D649 Anemia, unspecified: Secondary | ICD-10-CM | POA: Diagnosis present

## 2024-02-14 DIAGNOSIS — M869 Osteomyelitis, unspecified: Secondary | ICD-10-CM | POA: Diagnosis present

## 2024-02-14 DIAGNOSIS — Z955 Presence of coronary angioplasty implant and graft: Secondary | ICD-10-CM

## 2024-02-14 DIAGNOSIS — Z833 Family history of diabetes mellitus: Secondary | ICD-10-CM

## 2024-02-14 DIAGNOSIS — E669 Obesity, unspecified: Secondary | ICD-10-CM

## 2024-02-14 DIAGNOSIS — L089 Local infection of the skin and subcutaneous tissue, unspecified: Principal | ICD-10-CM

## 2024-02-14 DIAGNOSIS — L97413 Non-pressure chronic ulcer of right heel and midfoot with necrosis of muscle: Secondary | ICD-10-CM | POA: Diagnosis present

## 2024-02-14 DIAGNOSIS — S91301A Unspecified open wound, right foot, initial encounter: Secondary | ICD-10-CM | POA: Diagnosis present

## 2024-02-14 DIAGNOSIS — Z87891 Personal history of nicotine dependence: Secondary | ICD-10-CM

## 2024-02-14 DIAGNOSIS — Z794 Long term (current) use of insulin: Secondary | ICD-10-CM

## 2024-02-14 DIAGNOSIS — E785 Hyperlipidemia, unspecified: Secondary | ICD-10-CM

## 2024-02-14 DIAGNOSIS — Z89512 Acquired absence of left leg below knee: Secondary | ICD-10-CM

## 2024-02-14 DIAGNOSIS — Z79899 Other long term (current) drug therapy: Secondary | ICD-10-CM

## 2024-02-14 DIAGNOSIS — Z881 Allergy status to other antibiotic agents status: Secondary | ICD-10-CM

## 2024-02-14 DIAGNOSIS — Z87892 Personal history of anaphylaxis: Secondary | ICD-10-CM

## 2024-02-14 DIAGNOSIS — E1165 Type 2 diabetes mellitus with hyperglycemia: Secondary | ICD-10-CM | POA: Diagnosis present

## 2024-02-14 DIAGNOSIS — Z7902 Long term (current) use of antithrombotics/antiplatelets: Secondary | ICD-10-CM

## 2024-02-14 DIAGNOSIS — Z886 Allergy status to analgesic agent status: Secondary | ICD-10-CM

## 2024-02-14 DIAGNOSIS — L89220 Pressure ulcer of left hip, unstageable: Secondary | ICD-10-CM | POA: Diagnosis present

## 2024-02-14 DIAGNOSIS — Z888 Allergy status to other drugs, medicaments and biological substances status: Secondary | ICD-10-CM

## 2024-02-14 DIAGNOSIS — I10 Essential (primary) hypertension: Secondary | ICD-10-CM | POA: Diagnosis present

## 2024-02-14 DIAGNOSIS — N189 Chronic kidney disease, unspecified: Secondary | ICD-10-CM | POA: Diagnosis present

## 2024-02-14 DIAGNOSIS — Z90710 Acquired absence of both cervix and uterus: Secondary | ICD-10-CM

## 2024-02-14 DIAGNOSIS — I739 Peripheral vascular disease, unspecified: Secondary | ICD-10-CM | POA: Insufficient documentation

## 2024-02-14 DIAGNOSIS — Z8419 Family history of other disorders of kidney and ureter: Secondary | ICD-10-CM

## 2024-02-14 DIAGNOSIS — F39 Unspecified mood [affective] disorder: Secondary | ICD-10-CM | POA: Diagnosis present

## 2024-02-14 DIAGNOSIS — R251 Tremor, unspecified: Secondary | ICD-10-CM | POA: Diagnosis present

## 2024-02-14 DIAGNOSIS — Z91199 Patient's noncompliance with other medical treatment and regimen due to unspecified reason: Secondary | ICD-10-CM

## 2024-02-14 DIAGNOSIS — E1151 Type 2 diabetes mellitus with diabetic peripheral angiopathy without gangrene: Secondary | ICD-10-CM | POA: Diagnosis present

## 2024-02-14 DIAGNOSIS — I130 Hypertensive heart and chronic kidney disease with heart failure and stage 1 through stage 4 chronic kidney disease, or unspecified chronic kidney disease: Secondary | ICD-10-CM | POA: Diagnosis present

## 2024-02-14 DIAGNOSIS — K219 Gastro-esophageal reflux disease without esophagitis: Secondary | ICD-10-CM | POA: Diagnosis present

## 2024-02-14 DIAGNOSIS — Z8701 Personal history of pneumonia (recurrent): Secondary | ICD-10-CM

## 2024-02-14 DIAGNOSIS — E782 Mixed hyperlipidemia: Secondary | ICD-10-CM | POA: Diagnosis present

## 2024-02-14 DIAGNOSIS — E66812 Obesity, class 2: Secondary | ICD-10-CM | POA: Diagnosis present

## 2024-02-14 DIAGNOSIS — Z8041 Family history of malignant neoplasm of ovary: Secondary | ICD-10-CM

## 2024-02-14 DIAGNOSIS — G4733 Obstructive sleep apnea (adult) (pediatric): Secondary | ICD-10-CM | POA: Diagnosis present

## 2024-02-14 DIAGNOSIS — E1122 Type 2 diabetes mellitus with diabetic chronic kidney disease: Secondary | ICD-10-CM | POA: Diagnosis present

## 2024-02-14 DIAGNOSIS — I509 Heart failure, unspecified: Secondary | ICD-10-CM

## 2024-02-14 DIAGNOSIS — B964 Proteus (mirabilis) (morganii) as the cause of diseases classified elsewhere: Secondary | ICD-10-CM | POA: Diagnosis present

## 2024-02-14 DIAGNOSIS — L03115 Cellulitis of right lower limb: Secondary | ICD-10-CM | POA: Diagnosis present

## 2024-02-14 DIAGNOSIS — B965 Pseudomonas (aeruginosa) (mallei) (pseudomallei) as the cause of diseases classified elsewhere: Secondary | ICD-10-CM | POA: Diagnosis present

## 2024-02-14 DIAGNOSIS — M7731 Calcaneal spur, right foot: Secondary | ICD-10-CM | POA: Diagnosis present

## 2024-02-14 DIAGNOSIS — E1169 Type 2 diabetes mellitus with other specified complication: Secondary | ICD-10-CM | POA: Diagnosis present

## 2024-02-14 DIAGNOSIS — Z91018 Allergy to other foods: Secondary | ICD-10-CM

## 2024-02-14 DIAGNOSIS — Z825 Family history of asthma and other chronic lower respiratory diseases: Secondary | ICD-10-CM

## 2024-02-14 DIAGNOSIS — E11621 Type 2 diabetes mellitus with foot ulcer: Principal | ICD-10-CM | POA: Diagnosis present

## 2024-02-14 DIAGNOSIS — E1140 Type 2 diabetes mellitus with diabetic neuropathy, unspecified: Secondary | ICD-10-CM | POA: Diagnosis present

## 2024-02-14 DIAGNOSIS — Z8249 Family history of ischemic heart disease and other diseases of the circulatory system: Secondary | ICD-10-CM

## 2024-02-14 DIAGNOSIS — Z6835 Body mass index (BMI) 35.0-35.9, adult: Secondary | ICD-10-CM

## 2024-02-14 DIAGNOSIS — I4891 Unspecified atrial fibrillation: Secondary | ICD-10-CM | POA: Diagnosis present

## 2024-02-14 NOTE — ED Triage Notes (Signed)
 Pt bib family from home requesting pt diabetic wound to right heel be checked for infection, pt family says they sent Pic to pt Dr yesterday and he sent in Rx for antibiotic that pt started yesterday, wound has been present for months, pt used to get wound care but is not at this time.

## 2024-02-15 ENCOUNTER — Emergency Department (HOSPITAL_COMMUNITY)

## 2024-02-15 ENCOUNTER — Inpatient Hospital Stay (HOSPITAL_COMMUNITY)

## 2024-02-15 DIAGNOSIS — E1165 Type 2 diabetes mellitus with hyperglycemia: Secondary | ICD-10-CM

## 2024-02-15 DIAGNOSIS — F4489 Other dissociative and conversion disorders: Secondary | ICD-10-CM | POA: Diagnosis not present

## 2024-02-15 DIAGNOSIS — Z7902 Long term (current) use of antithrombotics/antiplatelets: Secondary | ICD-10-CM | POA: Diagnosis not present

## 2024-02-15 DIAGNOSIS — K219 Gastro-esophageal reflux disease without esophagitis: Secondary | ICD-10-CM | POA: Diagnosis present

## 2024-02-15 DIAGNOSIS — I739 Peripheral vascular disease, unspecified: Secondary | ICD-10-CM

## 2024-02-15 DIAGNOSIS — S91301A Unspecified open wound, right foot, initial encounter: Secondary | ICD-10-CM

## 2024-02-15 DIAGNOSIS — I110 Hypertensive heart disease with heart failure: Secondary | ICD-10-CM | POA: Diagnosis not present

## 2024-02-15 DIAGNOSIS — I2510 Atherosclerotic heart disease of native coronary artery without angina pectoris: Secondary | ICD-10-CM | POA: Diagnosis present

## 2024-02-15 DIAGNOSIS — R6889 Other general symptoms and signs: Secondary | ICD-10-CM | POA: Diagnosis not present

## 2024-02-15 DIAGNOSIS — E66812 Obesity, class 2: Secondary | ICD-10-CM | POA: Diagnosis present

## 2024-02-15 DIAGNOSIS — D649 Anemia, unspecified: Secondary | ICD-10-CM | POA: Diagnosis present

## 2024-02-15 DIAGNOSIS — I10 Essential (primary) hypertension: Secondary | ICD-10-CM

## 2024-02-15 DIAGNOSIS — R251 Tremor, unspecified: Secondary | ICD-10-CM | POA: Diagnosis not present

## 2024-02-15 DIAGNOSIS — L89220 Pressure ulcer of left hip, unstageable: Secondary | ICD-10-CM | POA: Diagnosis present

## 2024-02-15 DIAGNOSIS — L97419 Non-pressure chronic ulcer of right heel and midfoot with unspecified severity: Secondary | ICD-10-CM | POA: Diagnosis not present

## 2024-02-15 DIAGNOSIS — L97401 Non-pressure chronic ulcer of unspecified heel and midfoot limited to breakdown of skin: Secondary | ICD-10-CM

## 2024-02-15 DIAGNOSIS — M86172 Other acute osteomyelitis, left ankle and foot: Secondary | ICD-10-CM | POA: Diagnosis not present

## 2024-02-15 DIAGNOSIS — E11621 Type 2 diabetes mellitus with foot ulcer: Secondary | ICD-10-CM | POA: Diagnosis present

## 2024-02-15 DIAGNOSIS — B964 Proteus (mirabilis) (morganii) as the cause of diseases classified elsewhere: Secondary | ICD-10-CM | POA: Diagnosis present

## 2024-02-15 DIAGNOSIS — E1151 Type 2 diabetes mellitus with diabetic peripheral angiopathy without gangrene: Secondary | ICD-10-CM | POA: Diagnosis present

## 2024-02-15 DIAGNOSIS — E782 Mixed hyperlipidemia: Secondary | ICD-10-CM | POA: Diagnosis present

## 2024-02-15 DIAGNOSIS — E1169 Type 2 diabetes mellitus with other specified complication: Secondary | ICD-10-CM | POA: Diagnosis not present

## 2024-02-15 DIAGNOSIS — E1122 Type 2 diabetes mellitus with diabetic chronic kidney disease: Secondary | ICD-10-CM | POA: Diagnosis present

## 2024-02-15 DIAGNOSIS — L97413 Non-pressure chronic ulcer of right heel and midfoot with necrosis of muscle: Secondary | ICD-10-CM | POA: Diagnosis present

## 2024-02-15 DIAGNOSIS — L03115 Cellulitis of right lower limb: Secondary | ICD-10-CM | POA: Diagnosis present

## 2024-02-15 DIAGNOSIS — L089 Local infection of the skin and subcutaneous tissue, unspecified: Secondary | ICD-10-CM | POA: Diagnosis present

## 2024-02-15 DIAGNOSIS — N189 Chronic kidney disease, unspecified: Secondary | ICD-10-CM | POA: Diagnosis present

## 2024-02-15 DIAGNOSIS — F39 Unspecified mood [affective] disorder: Secondary | ICD-10-CM | POA: Diagnosis present

## 2024-02-15 DIAGNOSIS — Z743 Need for continuous supervision: Secondary | ICD-10-CM | POA: Diagnosis not present

## 2024-02-15 DIAGNOSIS — B965 Pseudomonas (aeruginosa) (mallei) (pseudomallei) as the cause of diseases classified elsewhere: Secondary | ICD-10-CM | POA: Diagnosis present

## 2024-02-15 DIAGNOSIS — J449 Chronic obstructive pulmonary disease, unspecified: Secondary | ICD-10-CM | POA: Diagnosis present

## 2024-02-15 DIAGNOSIS — E785 Hyperlipidemia, unspecified: Secondary | ICD-10-CM | POA: Diagnosis present

## 2024-02-15 DIAGNOSIS — Z794 Long term (current) use of insulin: Secondary | ICD-10-CM

## 2024-02-15 DIAGNOSIS — E1140 Type 2 diabetes mellitus with diabetic neuropathy, unspecified: Secondary | ICD-10-CM | POA: Diagnosis present

## 2024-02-15 DIAGNOSIS — B957 Other staphylococcus as the cause of diseases classified elsewhere: Secondary | ICD-10-CM | POA: Diagnosis present

## 2024-02-15 DIAGNOSIS — I130 Hypertensive heart and chronic kidney disease with heart failure and stage 1 through stage 4 chronic kidney disease, or unspecified chronic kidney disease: Secondary | ICD-10-CM | POA: Diagnosis present

## 2024-02-15 DIAGNOSIS — E119 Type 2 diabetes mellitus without complications: Secondary | ICD-10-CM | POA: Diagnosis present

## 2024-02-15 DIAGNOSIS — G4733 Obstructive sleep apnea (adult) (pediatric): Secondary | ICD-10-CM | POA: Diagnosis present

## 2024-02-15 DIAGNOSIS — T148XXA Other injury of unspecified body region, initial encounter: Secondary | ICD-10-CM | POA: Diagnosis present

## 2024-02-15 DIAGNOSIS — I509 Heart failure, unspecified: Secondary | ICD-10-CM | POA: Diagnosis present

## 2024-02-15 DIAGNOSIS — Z4800 Encounter for change or removal of nonsurgical wound dressing: Secondary | ICD-10-CM | POA: Diagnosis present

## 2024-02-15 DIAGNOSIS — Z7401 Bed confinement status: Secondary | ICD-10-CM | POA: Diagnosis not present

## 2024-02-15 DIAGNOSIS — Z8249 Family history of ischemic heart disease and other diseases of the circulatory system: Secondary | ICD-10-CM | POA: Diagnosis not present

## 2024-02-15 DIAGNOSIS — M869 Osteomyelitis, unspecified: Secondary | ICD-10-CM | POA: Diagnosis present

## 2024-02-15 DIAGNOSIS — E669 Obesity, unspecified: Secondary | ICD-10-CM | POA: Diagnosis present

## 2024-02-15 LAB — LACTIC ACID, PLASMA: Lactic Acid, Venous: 1.9 mmol/L (ref 0.5–1.9)

## 2024-02-15 LAB — PROTIME-INR
INR: 1 (ref 0.8–1.2)
Prothrombin Time: 14.2 s (ref 11.4–15.2)

## 2024-02-15 LAB — GLUCOSE, CAPILLARY
Glucose-Capillary: 235 mg/dL — ABNORMAL HIGH (ref 70–99)
Glucose-Capillary: 295 mg/dL — ABNORMAL HIGH (ref 70–99)
Glucose-Capillary: 240 mg/dL — ABNORMAL HIGH (ref 70–99)

## 2024-02-15 LAB — MAGNESIUM: Magnesium: 1.8 mg/dL (ref 1.7–2.4)

## 2024-02-15 LAB — CBC WITH DIFFERENTIAL/PLATELET
Abs Immature Granulocytes: 0.04 K/uL (ref 0.00–0.07)
Basophils Absolute: 0.1 K/uL (ref 0.0–0.1)
Basophils Relative: 1 %
Eosinophils Absolute: 0.2 K/uL (ref 0.0–0.5)
Eosinophils Relative: 2 %
HCT: 32.8 % — ABNORMAL LOW (ref 36.0–46.0)
Hemoglobin: 10.5 g/dL — ABNORMAL LOW (ref 12.0–15.0)
Immature Granulocytes: 0 %
Lymphocytes Relative: 27 %
Lymphs Abs: 2.6 K/uL (ref 0.7–4.0)
MCH: 30.2 pg (ref 26.0–34.0)
MCHC: 32 g/dL (ref 30.0–36.0)
MCV: 94.3 fL (ref 80.0–100.0)
Monocytes Absolute: 0.9 K/uL (ref 0.1–1.0)
Monocytes Relative: 9 %
Neutro Abs: 5.8 K/uL (ref 1.7–7.7)
Neutrophils Relative %: 61 %
Platelets: 152 K/uL (ref 150–400)
RBC: 3.48 MIL/uL — ABNORMAL LOW (ref 3.87–5.11)
RDW: 14.2 % (ref 11.5–15.5)
WBC: 9.5 K/uL (ref 4.0–10.5)
nRBC: 0 % (ref 0.0–0.2)

## 2024-02-15 LAB — COMPREHENSIVE METABOLIC PANEL WITH GFR
ALT: 23 U/L (ref 0–44)
AST: 44 U/L — ABNORMAL HIGH (ref 15–41)
Albumin: 3.5 g/dL (ref 3.5–5.0)
Alkaline Phosphatase: 91 U/L (ref 38–126)
Anion gap: 6 (ref 5–15)
BUN: 25 mg/dL — ABNORMAL HIGH (ref 6–20)
CO2: 32 mmol/L (ref 22–32)
Calcium: 9.3 mg/dL (ref 8.9–10.3)
Chloride: 103 mmol/L (ref 98–111)
Creatinine, Ser: 0.87 mg/dL (ref 0.44–1.00)
GFR, Estimated: >60 mL/min
Glucose, Bld: 158 mg/dL — ABNORMAL HIGH (ref 70–99)
Potassium: 4.1 mmol/L (ref 3.5–5.1)
Sodium: 141 mmol/L (ref 135–145)
Total Bilirubin: 0.4 mg/dL (ref 0.0–1.2)
Total Protein: 6.6 g/dL (ref 6.5–8.1)

## 2024-02-15 LAB — CBG MONITORING, ED: Glucose-Capillary: 248 mg/dL — ABNORMAL HIGH (ref 70–99)

## 2024-02-15 MED ORDER — PIPERACILLIN-TAZOBACTAM 3.375 G IVPB 30 MIN
3.3750 g | Freq: Once | INTRAVENOUS | Status: AC
  Administered 2024-02-15: 3.375 g via INTRAVENOUS
  Filled 2024-02-15: qty 50

## 2024-02-15 MED ORDER — HYDROMORPHONE HCL 1 MG/ML IJ SOLN
0.5000 mg | Freq: Once | INTRAMUSCULAR | Status: AC
  Administered 2024-02-15: 0.5 mg via INTRAVENOUS
  Filled 2024-02-15: qty 0.5

## 2024-02-15 MED ORDER — INSULIN GLARGINE 100 UNIT/ML ~~LOC~~ SOLN
10.0000 [IU] | Freq: Every day | SUBCUTANEOUS | Status: DC
  Administered 2024-02-16 – 2024-02-19 (×5): 10 [IU] via SUBCUTANEOUS
  Filled 2024-02-15 (×7): qty 0.1

## 2024-02-15 MED ORDER — ENOXAPARIN SODIUM 40 MG/0.4ML IJ SOSY
40.0000 mg | PREFILLED_SYRINGE | INTRAMUSCULAR | Status: DC
  Administered 2024-02-15 – 2024-02-16 (×2): 40 mg via SUBCUTANEOUS
  Filled 2024-02-15 (×2): qty 0.4

## 2024-02-15 MED ORDER — METOPROLOL TARTRATE 12.5 MG HALF TABLET
12.5000 mg | ORAL_TABLET | Freq: Two times a day (BID) | ORAL | Status: DC
  Administered 2024-02-15 – 2024-02-24 (×19): 12.5 mg via ORAL
  Filled 2024-02-15 (×10): qty 1

## 2024-02-15 MED ORDER — PANTOPRAZOLE SODIUM 40 MG PO TBEC
40.0000 mg | DELAYED_RELEASE_TABLET | Freq: Every day | ORAL | Status: DC
  Administered 2024-02-15 – 2024-02-24 (×10): 40 mg via ORAL
  Filled 2024-02-15 (×5): qty 1

## 2024-02-15 MED ORDER — VANCOMYCIN HCL 1250 MG/250ML IV SOLN
1250.0000 mg | INTRAVENOUS | Status: DC
  Filled 2024-02-15: qty 250

## 2024-02-15 MED ORDER — INSULIN ASPART 100 UNIT/ML IJ SOLN
0.0000 [IU] | Freq: Three times a day (TID) | INTRAMUSCULAR | Status: DC
  Administered 2024-02-15 (×2): 5 [IU] via SUBCUTANEOUS
  Administered 2024-02-15: 8 [IU] via SUBCUTANEOUS
  Administered 2024-02-16: 5 [IU] via SUBCUTANEOUS
  Administered 2024-02-16: 11 [IU] via SUBCUTANEOUS
  Administered 2024-02-16: 8 [IU] via SUBCUTANEOUS
  Administered 2024-02-17 (×2): 3 [IU] via SUBCUTANEOUS
  Administered 2024-02-18 (×2): 5 [IU] via SUBCUTANEOUS
  Administered 2024-02-18: 3 [IU] via SUBCUTANEOUS
  Administered 2024-02-19: 8 [IU] via SUBCUTANEOUS
  Administered 2024-02-19: 5 [IU] via SUBCUTANEOUS
  Administered 2024-02-19: 8 [IU] via SUBCUTANEOUS
  Administered 2024-02-20 (×2): 11 [IU] via SUBCUTANEOUS
  Filled 2024-02-15 (×2): qty 5
  Filled 2024-02-15: qty 2
  Filled 2024-02-15: qty 8
  Filled 2024-02-15: qty 5
  Filled 2024-02-15: qty 3
  Filled 2024-02-15 (×2): qty 5
  Filled 2024-02-15: qty 2
  Filled 2024-02-15: qty 11
  Filled 2024-02-15: qty 7
  Filled 2024-02-15: qty 3
  Filled 2024-02-15: qty 5
  Filled 2024-02-15: qty 2

## 2024-02-15 MED ORDER — CLOPIDOGREL BISULFATE 75 MG PO TABS
75.0000 mg | ORAL_TABLET | Freq: Every day | ORAL | Status: DC
  Administered 2024-02-15 – 2024-02-24 (×9): 75 mg via ORAL
  Filled 2024-02-15 (×5): qty 1

## 2024-02-15 MED ORDER — ONDANSETRON HCL 4 MG PO TABS
4.0000 mg | ORAL_TABLET | Freq: Four times a day (QID) | ORAL | Status: DC | PRN
  Administered 2024-02-22: 4 mg via ORAL

## 2024-02-15 MED ORDER — LINEZOLID 600 MG/300ML IV SOLN
600.0000 mg | Freq: Two times a day (BID) | INTRAVENOUS | Status: DC
  Administered 2024-02-15 – 2024-02-24 (×19): 600 mg via INTRAVENOUS
  Filled 2024-02-15 (×10): qty 300

## 2024-02-15 MED ORDER — LACTATED RINGERS IV BOLUS
1000.0000 mL | Freq: Once | INTRAVENOUS | Status: AC
  Administered 2024-02-15: 1000 mL via INTRAVENOUS

## 2024-02-15 MED ORDER — SODIUM CHLORIDE 0.9 % IV SOLN
3.0000 g | Freq: Four times a day (QID) | INTRAVENOUS | Status: DC
  Administered 2024-02-15 – 2024-02-20 (×21): 3 g via INTRAVENOUS
  Filled 2024-02-15 (×19): qty 8

## 2024-02-15 MED ORDER — GABAPENTIN 300 MG PO CAPS
300.0000 mg | ORAL_CAPSULE | Freq: Every day | ORAL | Status: DC
  Administered 2024-02-15 – 2024-02-23 (×9): 300 mg via ORAL
  Filled 2024-02-15 (×5): qty 1

## 2024-02-15 MED ORDER — OXYCODONE HCL 5 MG PO TABS
5.0000 mg | ORAL_TABLET | ORAL | Status: DC | PRN
  Administered 2024-02-15 – 2024-02-24 (×20): 5 mg via ORAL
  Filled 2024-02-15 (×11): qty 1

## 2024-02-15 MED ORDER — INSULIN ASPART 100 UNIT/ML IJ SOLN
0.0000 [IU] | Freq: Every day | INTRAMUSCULAR | Status: DC
  Administered 2024-02-15: 2 [IU] via SUBCUTANEOUS
  Administered 2024-02-16 – 2024-02-20 (×4): 3 [IU] via SUBCUTANEOUS
  Filled 2024-02-15: qty 5
  Filled 2024-02-15 (×3): qty 3
  Filled 2024-02-15: qty 2

## 2024-02-15 MED ORDER — ONDANSETRON HCL 4 MG/2ML IJ SOLN
4.0000 mg | Freq: Four times a day (QID) | INTRAMUSCULAR | Status: DC | PRN
  Administered 2024-02-22: 4 mg via INTRAVENOUS

## 2024-02-15 MED ORDER — INSULIN GLARGINE-YFGN 100 UNIT/ML ~~LOC~~ SOLN
10.0000 [IU] | Freq: Every day | SUBCUTANEOUS | Status: DC
  Filled 2024-02-15: qty 0.1

## 2024-02-15 MED ORDER — ROSUVASTATIN CALCIUM 20 MG PO TABS
40.0000 mg | ORAL_TABLET | Freq: Every day | ORAL | Status: DC
  Administered 2024-02-15 – 2024-02-23 (×9): 40 mg via ORAL
  Filled 2024-02-15 (×5): qty 2

## 2024-02-15 MED ORDER — ISOSORBIDE MONONITRATE ER 30 MG PO TB24
30.0000 mg | ORAL_TABLET | Freq: Every day | ORAL | Status: DC
  Administered 2024-02-15 – 2024-02-24 (×10): 30 mg via ORAL
  Filled 2024-02-15 (×5): qty 1

## 2024-02-15 MED ORDER — ACETAMINOPHEN 325 MG PO TABS: 650.0000 mg | ORAL_TABLET | Freq: Four times a day (QID) | ORAL | Status: DC | PRN

## 2024-02-15 MED ORDER — ACETAMINOPHEN 650 MG RE SUPP: 650.0000 mg | Freq: Four times a day (QID) | RECTAL | Status: DC | PRN

## 2024-02-15 MED ORDER — GADOBUTROL 1 MMOL/ML IV SOLN
10.0000 mL | Freq: Once | INTRAVENOUS | Status: AC | PRN
  Administered 2024-02-15: 10 mL via INTRAVENOUS

## 2024-02-15 MED ORDER — BUSPIRONE HCL 5 MG PO TABS
5.0000 mg | ORAL_TABLET | Freq: Two times a day (BID) | ORAL | Status: DC
  Administered 2024-02-15 – 2024-02-24 (×19): 5 mg via ORAL
  Filled 2024-02-15 (×10): qty 1

## 2024-02-15 MED ORDER — PIPERACILLIN-TAZOBACTAM 3.375 G IVPB
3.3750 g | Freq: Three times a day (TID) | INTRAVENOUS | Status: DC
  Administered 2024-02-15: 3.375 g via INTRAVENOUS
  Filled 2024-02-15: qty 50

## 2024-02-15 MED ORDER — VANCOMYCIN HCL IN DEXTROSE 1-5 GM/200ML-% IV SOLN
1000.0000 mg | Freq: Once | INTRAVENOUS | Status: AC
  Administered 2024-02-15: 1000 mg via INTRAVENOUS
  Filled 2024-02-15: qty 200

## 2024-02-15 NOTE — Progress Notes (Signed)
 " PROGRESS NOTE    Nicole [name]  FMW:[id number] DOB: May 14, 1965 DOA: 02/14/2024 PCP: Shona Norleen PEDLAR, MD   Brief Narrative:  Day 0 note, please see H&P earlier this morning by my partner Dr. Adefeso for complete documentation  Briefly patient is a 57 year old female with known history of hypertension hyperlipidemia GERD CAD diabetes type 2, peripheral arterial disease status post left BKA, COPD on room air sleep apnea noncompliant with CPAP and A-fib not currently on anticoagulation.  Patient reports to our facility with worsening right heel wound followed outpatient by Dr. Malvin where she has been having local wound debridement with no improvement.  Of note she was recently admitted to our facility on 9/17 to 9/23 for the same infection - at that time vascular surgery was consulted and recommended BKA, patient refused surgery at that time.   Assessment & Plan:   Principal Problem:   Non-healing open wound of right heel  Chronic diabetic foot ulcer with acute superimposed cellulitis, POA Peripheral artery disease, chronic, moderate to severe - Transition to Unasyn , linezolid  per discussion with pharmacy - Podiatry Dr. Janit to consult per overnight documentation - Pain currently well-controlled on oxycodone  5 mg every 4 hour -Wound care to follow - Patient may require further evaluation with vascular surgery as well given previous discussion in regards to BKA - Continue Plavix , rosuvastatin   OSA on CPAP - Continue CPAP at night Tremor/diabetic neuropathy -chronic, continue home gabapentin .  Mood disorder, unspecified - Stable; Continue home buspirone  Essential hypertension - Continue metoprolol , Imdur  Mixed hyperlipidemia - Continue Crestor  History of CAD s/p DES to RCA in 2006 and another 2 stents in 2009  LHC in 2023 with nonobstructive CAD. Continue home Plavix , metoprolol , Imdur  and Crestor .   Uncontrolled type 2 diabetes mellitus with hyperglycemia -Most recent A1c  13.2 a few months prior, no need to recheck at this point given longstanding history of profoundly uncontrolled diabetes -Continue long-acting insulin , sliding scale as well as hypoglycemia protocol.  Labs improving somewhat over the last 24 hours  GERD - Continue Protonix  Obesity class II (BMI 35.87) - Diet and lifestyle modification discussed and recommended    DVT prophylaxis: enoxaparin  (LOVENOX ) injection 40 mg Start: 02/15/24 1200 SCDs Start: 02/15/24 0741 Code Status:   Code Status: Full Code Family Communication: None present  Status is: Inpatient  Dispo: The patient is from: Home              Anticipated d/c is to: To be determined              Anticipated d/c date is: And to be determined              Patient currently not medically stable for discharge  Consultants:  Podiatry  Procedures:  Pending above  Antimicrobials:  Unasyn , linezolid   Subjective: No acute issues or events overnight denies nausea vomiting diarrhea constipation headache fevers chills or chest pain  Objective: Vitals:   02/15/24 0530 02/15/24 0600 02/15/24 0800 02/15/24 0806  BP: (!) 149/63 (!) 130/54 (!) 158/75   Pulse: 64 63 80   Resp: 18 18 14    Temp:    98.3 F (36.8 C)  TempSrc:    Oral  SpO2: 95% 94% 99%   Weight:      Height:       No intake or output data in the 24 hours ending 02/15/24 0818 Filed Weights   02/14/24 2224  Weight: 94.8 kg    Examination:  General:  Pleasantly resting  in bed, No acute distress. HEENT:  Normocephalic atraumatic.  Sclerae nonicteric, noninjected.  Extraocular movements intact bilaterally. Neck:  Without mass or deformity.  Trachea is midline. Lungs:  Clear to auscultate bilaterally without rhonchi, wheeze, or rales. Heart:  Regular rate and rhythm.  Without murmurs, rubs, or gallops. Abdomen:  Soft, nontender, nondistended.  Without guarding or rebound. Extremities: Without cyanosis, clubbing; L BKA noted Skin: See below:   Data Reviewed:  I have personally reviewed following labs and imaging studies  CBC: Recent Labs  Lab 02/15/24 0108  WBC 9.5  NEUTROABS 5.8  HGB 10.5*  HCT 32.8*  MCV 94.3  PLT 152   Basic Metabolic Panel: Recent Labs  Lab 02/15/24 0108  NA 141  K 4.1  CL 103  CO2 32  GLUCOSE 158*  BUN 25*  CREATININE 0.87  CALCIUM  9.3  MG 1.8   GFR: Estimated Creatinine Clearance: 77.7 mL/min (by C-G formula based on SCr of 0.87 mg/dL). Liver Function Tests: Recent Labs  Lab 02/15/24 0108  AST 44*  ALT 23  ALKPHOS 91  BILITOT 0.4  PROT 6.6  ALBUMIN 3.5   No results for input(s): LIPASE, AMYLASE in the last 168 hours. No results for input(s): AMMONIA in the last 168 hours. Coagulation Profile: Recent Labs  Lab 02/15/24 0108  INR 1.0   Cardiac Enzymes: No results for input(s): CKTOTAL, CKMB, CKMBINDEX, TROPONINI in the last 168 hours. BNP (last 3 results) No results for input(s): PROBNP in the last 8760 hours. HbA1C: No results for input(s): HGBA1C in the last 72 hours. CBG: Recent Labs  Lab 02/15/24 0801  GLUCAP 248*   Lipid Profile: No results for input(s): CHOL, HDL, LDLCALC, TRIG, CHOLHDL, LDLDIRECT in the last 72 hours. Thyroid  Function Tests: No results for input(s): TSH, T4TOTAL, FREET4, T3FREE, THYROIDAB in the last 72 hours. Anemia Panel: No results for input(s): VITAMINB12, FOLATE, FERRITIN, TIBC, IRON, RETICCTPCT in the last 72 hours. Sepsis Labs: Recent Labs  Lab 02/15/24 0108  LATICACIDVEN 1.9    Recent Results (from the past 240 hours)  Blood Culture (routine x 2)     Status: None (Preliminary result)   Collection Time: 02/15/24  1:08 AM   Specimen: BLOOD  Result Value Ref Range Status   Specimen Description BLOOD BLOOD LEFT ARM  Final   Special Requests   Final    BOTTLES DRAWN AEROBIC AND ANAEROBIC Blood Culture adequate volume Performed at [facility], [address], [place], [place] [address]     Culture PENDING  Incomplete   Report Status PENDING  Incomplete  Blood Culture (routine x 2)     Status: None (Preliminary result)   Collection Time: 02/15/24  1:11 AM   Specimen: BLOOD  Result Value Ref Range Status   Specimen Description BLOOD BLOOD RIGHT ARM  Final   Special Requests   Final    AEROBIC BOTTLE ONLY Blood Culture adequate volume Performed at [facility], [address], [place], [place] [address]    Culture PENDING  Incomplete   Report Status PENDING  Incomplete   Radiology Studies: DG Foot 2 Views Right Result Date: 02/15/2024 CLINICAL DATA:  Heel wound. EXAM: RIGHT FOOT - 2 VIEW COMPARISON:  01/08/2024 FINDINGS: Skin defect posterior to the calcaneus. No tracking soft tissue gas. Question of mild periosteal thickening subjacent ulcer, not seen on prior. No frank bony destruction. Again seen plantar calcaneal spur and Achilles tendon enthesophyte. Hammertoe deformity of the toes. Midfoot degenerative spurring. IMPRESSION: Skin defect posterior to the  calcaneus. Question of mild periosteal thickening subjacent to the ulcer, not seen on prior. No frank bony destruction. Electronically Signed   By: Andrea Gasman M.D.   On: 02/15/2024 00:49   Scheduled Meds:  enoxaparin  (LOVENOX ) injection  40 mg Subcutaneous Q24H   insulin  aspart  0-15 Units Subcutaneous TID WC   insulin  aspart  0-5 Units Subcutaneous QHS   insulin  glargine-yfgn  10 Units Subcutaneous QHS   Continuous Infusions:  piperacillin -tazobactam (ZOSYN )  IV 3.375 g (02/15/24 0756)   vancomycin        LOS: 0 days   Time spent:  Elsie JAYSON Montclair, DO [facility]  If 7PM-7AM, please contact night-coverage [email or web address]  02/15/2024, 8:18 AM      "

## 2024-02-15 NOTE — TOC CM/SW Note (Signed)
 Transition of Care [facility]) - Inpatient Brief Assessment   Patient Details  Name: SHARONE PICCHI MRN: [id number] Date of Birth: 1966/02/13  Transition of Care [facility]) CM/SW Contact:    Lauraine FORBES Saa, LCSWA Phone Number: 02/15/2024, 10:07 AM   Clinical Narrative:  10:07 AM Per chart review, patient resides at home. Patient has a PCP and insurance. Patient has SNF history [facility]. Patient has HH history with [facility]. Patient has DME (manual wheelchair, oxygen , rollator, shower chair, BSC) history with [facility]. Patient's preferred pharmacy's are [facility] and [facility] [address]. No TOC needs identified at this time. TOC will continue to follow.  Transition of Care Asessment: Insurance and Status: Insurance coverage has been reviewed Patient has primary care physician: Yes Home environment has been reviewed: Private Residence Prior level of function:: N/A Prior/Current Home Services: No current home services Social Drivers of Health Review: SDOH reviewed no interventions necessary Readmission risk has been reviewed: Yes (Currently Yellow 20%) Transition of care needs: no transition of care needs at this time

## 2024-02-15 NOTE — Progress Notes (Signed)
 Pharmacy Antibiotic Note  Nicole [name] is a 58 y.o. female admitted on 02/14/2024 with wound infection.  Pharmacy has been consulted for vancomycin  and Zosyn  dosing.  Plan: Vancomycin  1250mg  IV Q24H. Goal AUC 400-550.  Expected AUC 485. Zosyn  3.375g IV Q8H (4-hour infusion).  Height: 5' 4 (162.6 cm) Weight: 94.8 kg (208 lb 15.9 oz) IBW/kg (Calculated) : 54.7  Temp (24hrs), Avg:98.2 F (36.8 C), Min:98 F (36.7 C), Max:98.3 F (36.8 C)  Recent Labs  Lab 02/15/24 0108  WBC 9.5  CREATININE 0.87  LATICACIDVEN 1.9    Estimated Creatinine Clearance: 77.7 mL/min (by C-G formula based on SCr of 0.87 mg/dL).    Allergies[1]   Thank you for allowing pharmacy to be a part of this patients care.  Marvetta Dauphin, PharmD, BCPS  02/15/2024 7:24 AM     [1]  Allergies Allergen Reactions   Aspirin  Anaphylaxis and Other (See Comments)    Throat closing    Bee Venom Anaphylaxis   Pineapple Anaphylaxis   Definity  [Perflutren  Lipid Microsphere]     Muscle Aches   E-Mycin [Erythromycin Base] Nausea And Vomiting

## 2024-02-15 NOTE — Progress Notes (Signed)
 VAST consult received to obtain IV access as current IV access in right arm painful per patient. Left arm assessed visually and with ultrasound; appropriate vasculature for IV very limited in this arm. Able to place a 22G 1.75 inch PIV in left lateral forearm. Upper arm assessed as well: cephalic too small, brachials too small and deep, and basilic too deep for PIVs, including midline. VAST RN did not assess right arm.

## 2024-02-15 NOTE — Consult Note (Signed)
 WOC Nurse Consult Note: Reason for Consult:Right medial heel chronic wound with acute onset cellulitis  Awaiting MRI to determine osteomyelitis.  Podiatry consult pending.  LEft hip chronic unstageable pressure injury.  History left BKA.  Wound type: infectious, foot pressure hip and foot Pressure Injury POA: Yes Measurement:bedside RN to obtain and place in flowsheet Left hip:  bedside RN To measure (?depth) and place in flow sheet  Wound bed: right heel:  100% devitalized tissue Left hip 2 cm x 2 cm area with 2 distinct openings.   Drainage (amount, consistency, odor)  foul odor to right foot. Periwound: erythema and fluctuance to right heel Dressing procedure/placement/frequency:  Cleanse right heel wound with VASHE.  Apply VASHE moist gauze to wound bed. Cover with gauze and kerlix/tape.  Change daily.  Will not follow at this time.  Please re-consult if needed.  Darice Cooley MSN, RN, FNP-BC CWON Wound, Ostomy, Continence Nurse Outpatient [facility] [phone] Work cell phone:  [phone]

## 2024-02-15 NOTE — ED Notes (Signed)
 Pt taken to bathroom via WC by family

## 2024-02-15 NOTE — ED Provider Notes (Signed)
 " [place] [facility] AT [facility] Provider Note   CSN: [id number] Arrival date & time: 02/14/24  2213     Patient presents with: Wound Check   Nicole [name] is a 58 y.o. female.    Wound Check  Patient presents for right heel wound.  Medical history includes DM, CAD, HLD, CKD, GERD.  She has undergone prior BKA to the left leg.  Over the past several months, she has had an ongoing ulcer on area of right heel.  She has been getting wound care.  She is followed by Dr. Malvin.  She was advised to discontinue wound care debridements due to worsening of wound.  Over the past week, patient has had increased pain and surrounding erythema.  She sent a picture to her podiatrist, who started her on an oral antibiotic yesterday.  Family feels that she has had some confusion today.  She has had persistently elevated blood sugars.  In addition to localized pain on area of wound, patient endorses recent fatigue.    Prior to Admission medications  Medication Sig Start Date End Date Taking? Authorizing Provider  Accu-Chek Softclix Lancets lancets Use as instructed to monitor glucose 4 times daily- use as backup to Dexcom 03/10/23   Therisa Benton PARAS, NP  albuterol  (VENTOLIN  HFA) 108 (90 Base) MCG/ACT inhaler Inhale 2 puffs into the lungs every 6 (six) hours as needed for wheezing. Shortness of breath 07/30/20   Elnor Lauraine BRAVO, NP  amoxicillin -clavulanate (AUGMENTIN ) 875-125 MG tablet Take 1 tablet by mouth 2 (two) times daily. 02/13/24   Standiford, Marsa FALCON, DPM  busPIRone  (BUSPAR ) 5 MG tablet Take 5 mg by mouth 2 (two) times daily. 08/08/23   [provider]  Cholecalciferol  1.25 MG (50000 UT) capsule Take 50,000 Units by mouth once a week. 09/12/23   [provider]  clopidogrel  (PLAVIX ) 75 MG tablet Take 1 tablet by mouth once daily 11/22/23   Okey Vina GAILS, MD  doxycycline  (VIBRAMYCIN ) 100 MG capsule Take 1 capsule (100 mg total) by mouth 2 (two) times  daily. 01/08/24   Towana Ozell BROCKS, MD  EASY TOUCH INSULIN  SYRINGE 31G X 5/16 1 ML MISC  09/21/19   [provider]  EPINEPHrine  0.3 mg/0.3 mL IJ SOAJ injection Inject 0.3 mg into the muscle as needed for anaphylaxis. 05/15/20   Elnor Lauraine BRAVO, NP  gabapentin  (NEURONTIN ) 300 MG capsule Take 1 capsule (300 mg total) by mouth at bedtime. 11/15/23 11/14/24  Gonfa, Taye T, MD  glucose blood (ACCU-CHEK GUIDE TEST) test strip Use as instructed to monitor glucose 4 times daily- use as back up for Dexcom 03/10/23   Reardon, Whitney J, NP  HYDROcodone -acetaminophen  (NORCO/VICODIN) 5-325 MG tablet Take 1 tablet by mouth every 6 (six) hours as needed. 01/08/24   Towana Ozell BROCKS, MD  Insulin  Pen Needle (B-D ULTRAFINE III SHORT PEN) 31G X 8 MM MISC USE AS DIRECTED 07/06/21   Jaycee Greig PARAS, NP  insulin  regular human CONCENTRATED (HUMULIN  R U-500 KWIKPEN) 500 UNIT/ML KwikPen Inject 60 Units into the skin 3 (three) times daily with meals. 01/02/24   Therisa Benton PARAS, NP  isosorbide  mononitrate (IMDUR ) 30 MG 24 hr tablet Take 1 tablet (30 mg total) by mouth daily. 11/17/23 02/15/24  Johnson Laymon [name], PA-C  levocetirizine (XYZAL ) 5 MG tablet Take 5 mg by mouth daily.    [provider]  metoprolol  tartrate (LOPRESSOR ) 25 MG tablet Take 0.5 tablets (12.5 mg total) by mouth 2 (two) times  daily. 11/17/23 02/15/24  Strader, Laymon HERO, PA-C  nitroGLYCERIN  (NITROSTAT ) 0.4 MG SL tablet DISSOLVE ONE TABLET UNDER THE TONGUE EVERY 5 MINUTES AS NEEDED FOR CHEST PAIN.  DO NOT EXCEED A TOTAL OF 3 DOSES IN 15 MINUTES 09/07/22   Okey Vina GAILS, MD  nystatin  powder Apply 1 Application topically 3 (three) times daily. 08/20/23   Geroldine Berg, MD  pantoprazole  (PROTONIX ) 40 MG tablet Take 1 tablet (40 mg total) by mouth daily. 07/30/20   Elnor Lauraine BRAVO, NP  rosuvastatin  (CRESTOR ) 40 MG tablet Take 40 mg by mouth at bedtime. 04/16/21   [provider]  senna-docusate (SENOKOT-S) 8.6-50 MG tablet Take 1-2 tablets by  mouth 2 (two) times daily between meals as needed for mild constipation or moderate constipation. 11/15/23   Gonfa, Taye T, MD  torsemide  (DEMADEX ) 20 MG tablet Take 2 tablets by mouth once daily 09/19/23   Okey Vina GAILS, MD  traMADol  (ULTRAM ) 50 MG tablet Take 50 mg by mouth every 8 (eight) hours. 10/25/23   [provider]  VRAYLAR  3 MG capsule Take 3 mg by mouth daily. Patient taking differently: Take 1.5 mg by mouth daily. 11/15/21   [provider]    Allergies: Aspirin , Bee venom, Pineapple, Definity  [perflutren  lipid microsphere], and E-mycin [erythromycin base]    Review of Systems  Constitutional:  Positive for fatigue.  Skin:  Positive for wound.  Psychiatric/Behavioral:  Positive for confusion.   All other systems reviewed and are negative.   Updated Vital Signs BP (!) 149/63   Pulse 64   Temp 98.3 F (36.8 C) (Oral)   Resp 18   Ht 5' 4 (1.626 m)   Wt 94.8 kg   SpO2 95%   BMI 35.87 kg/m   Physical Exam Vitals and nursing note reviewed.  Constitutional:      General: She is not in acute distress.    Appearance: Normal appearance. She is well-developed. She is not ill-appearing, toxic-appearing or diaphoretic.  HENT:     Head: Normocephalic and atraumatic.     Right Ear: External ear normal.     Left Ear: External ear normal.     Nose: Nose normal.     Mouth/Throat:     Mouth: Mucous membranes are moist.  Eyes:     Extraocular Movements: Extraocular movements intact.     Conjunctiva/sclera: Conjunctivae normal.  Cardiovascular:     Rate and Rhythm: Normal rate and regular rhythm.  Pulmonary:     Effort: Pulmonary effort is normal. No respiratory distress.  Abdominal:     General: There is no distension.     Palpations: Abdomen is soft.  Musculoskeletal:        General: Tenderness present.     Cervical back: Normal range of motion and neck supple.  Skin:    General: Skin is warm and dry.     Findings: Erythema present.  Neurological:      General: No focal deficit present.     Mental Status: She is alert and oriented to person, place, and time.  Psychiatric:        Mood and Affect: Mood normal.        Behavior: Behavior normal.     (all labs ordered are listed, but only abnormal results are displayed) Labs Reviewed  COMPREHENSIVE METABOLIC PANEL WITH GFR - Abnormal; Notable for the following components:      Result Value   Glucose, Bld 158 (*)    BUN 25 (*)  AST 44 (*)    All other components within normal limits  CBC WITH DIFFERENTIAL/PLATELET - Abnormal; Notable for the following components:   RBC 3.48 (*)    Hemoglobin 10.5 (*)    HCT 32.8 (*)    All other components within normal limits  CULTURE, BLOOD (ROUTINE X 2)  CULTURE, BLOOD (ROUTINE X 2)  LACTIC ACID, PLASMA  PROTIME-INR  MAGNESIUM   CBG MONITORING, ED    EKG: EKG Interpretation Date/Time:  Wednesday February 15 2024 01:09:58 EST Ventricular Rate:  66 PR Interval:    QRS Duration:  150 QT Interval:  475 QTC Calculation: 467 R Axis:   257  Text Interpretation: Sinus rhythm RBBB and LAFB Confirmed by Melvenia Motto [phone]) on 02/15/2024 1:58:56 AM  Radiology: [facility] Foot 2 Views Right Result Date: 02/15/2024 CLINICAL DATA:  Heel wound. EXAM: RIGHT FOOT - 2 VIEW COMPARISON:  01/08/2024 FINDINGS: Skin defect posterior to the calcaneus. No tracking soft tissue gas. Question of mild periosteal thickening subjacent ulcer, not seen on prior. No frank bony destruction. Again seen plantar calcaneal spur and Achilles tendon enthesophyte. Hammertoe deformity of the toes. Midfoot degenerative spurring. IMPRESSION: Skin defect posterior to the calcaneus. Question of mild periosteal thickening subjacent to the ulcer, not seen on prior. No frank bony destruction. Electronically Signed   By: Andrea Gasman M.D.   On: 02/15/2024 00:49     Procedures   Medications Ordered in the ED  lactated ringers  bolus 1,000 mL (0 mLs Intravenous Stopped 02/15/24 0214)   HYDROmorphone  (DILAUDID ) injection 0.5 mg (0.5 mg Intravenous Given 02/15/24 0112)  vancomycin  (VANCOCIN ) IVPB 1000 mg/200 mL premix (0 mg Intravenous Stopped 02/15/24 0447)  piperacillin -tazobactam (ZOSYN ) IVPB 3.375 g (0 g Intravenous Stopped 02/15/24 0256)                                    Medical Decision Making Amount and/or Complexity of Data Reviewed Labs: ordered. Radiology: ordered.  Risk Prescription drug management. Decision regarding hospitalization.   This patient presents to the ED for concern of wound infection, this involves an extensive number of treatment options, and is a complaint that carries with it a high risk of complications and morbidity.  The differential diagnosis includes chronic wound, wound infection, cellulitis, osteomyelitis   Co morbidities / Chronic conditions that complicate the patient evaluation  DM, CAD, HLD, CKD, GERD   Additional history obtained:  Additional history obtained from EMR External records from outside source obtained and reviewed including patient's daughter   Lab Tests:  I Ordered, and personally interpreted labs.  The pertinent results include: Baseline anemia, leukocytosis, normal kidney function, normal electrolytes   Imaging Studies ordered:  I ordered imaging studies including right foot x-ray, right foot MRI I independently visualized and interpreted imaging which showed x-ray shows I agree with the radiologist interpretation   Cardiac Monitoring: / EKG:  The patient was maintained on a cardiac monitor.  I personally viewed and interpreted the cardiac monitored which showed an underlying rhythm of: Ulcer with questionable periosteal thickening underneath.  MRI was pending at time of admission.   Problem List / ED Course / Critical interventions / Medication management  Patient presenting for worsened right foot wound over the past week.  She has had associated fatigue and family noticed some confusion  today.  On arrival in the ED, vital signs are normal.  She is overall well-appearing on exam.  Area of  wound does show surrounding erythema with tenderness present.  It is foul-smelling.  Dilaudid  was ordered for analgesia.  Workup was initiated.  Patient's lab work was unremarkable.  X-ray imaging does show ulcer with underlying periosteal thickening.  Broad-spectrum antibiotics were ordered.  I spoke with podiatrist on-call, Dr. Janit, who agrees with admission to [facility].  MRI was ordered, however, unavailable until the morning.  Patient was admitted for further management. I ordered medication including Dilaudid  for analgesia, vancomycin  and Zosyn  for wound infection, IV fluids for hydration   Consultations Obtained:  I requested consultation with the podiatrist, Dr. Janit,  and discussed lab and imaging findings as well as pertinent plan - they recommend: Admission to [facility]   Social Determinants of Health:  Has access to outpatient care      Final diagnoses:  Infected wound    ED Discharge Orders     None          Melvenia Motto, MD 02/15/24 [phone]  "

## 2024-02-15 NOTE — H&P (Signed)
 " History and Physical    Patient: Nicole [name] FMW:[id number] DOB: Jul 21, 1965 DOA: 02/14/2024 DOS: the patient was seen and examined on 02/15/2024 PCP: Shona Norleen PEDLAR, MD  Patient coming from: Home  Chief Complaint:  Chief Complaint  Patient presents with   Wound Check   HPI: Nicole [name] is a 58 y.o. female with medical history significant of  hypertension, hyperlipidemia, GERD, CAD, T2DM, tremors, left BKA, COPD, CAD, OSA on CPAP but not working, A-fib not on [facility]  who presents to the emergency department due to worsening right heel wound.  Patient was being followed by Dr. Portia.  She has been going to local wound clinic where her wound was being debrided resulting in worsening wound healing, so she was asked to discontinue wound care debridements.  She complained of increased pain and surrounding redness of the wound within the last week and family felt like she was a little bit confused today on that side blood glucose level was elevated throughout the day, so she was brought to the ED for further evaluation and management.  She was admitted from 9/17 to 9/23 at [facility] for same wound infection.  Vascular surgery consulted offered BKA, but patient refused.  ED course In the emergency department, BP was 150/53, pulse 57 bpm, other vital signs were within normal limits.  Workup in the ED showed normal CBC except normocytic anemia, normal BMP except hyperglycemia and BUN of 25. Right foot x-ray showed skin defect posterior to the calcaneus.  Question of mild periosteal thickening subjacent to the ulcer, not seen on prior.  No frank bony destruction. EKG personally reviewed showed a normal sinus rhythm at a rate of 66 bpm She was treated with Vanco and Zosyn .  IV hydration was provided pain medication was given.  TRH was asked to admit patient.   Review of Systems: As mentioned in the history of present illness. All other systems reviewed and are negative. Past Medical History:   Diagnosis Date   CAP (community acquired pneumonia)    Streptococcus 01/2011   COPD (chronic obstructive pulmonary disease) (HCC)    Coronary atherosclerosis of native coronary artery    a. Diagnosed [place]  2006 - DES RCA, reports followup cath 2009 at [facility] b. reported 2 stents in [place]  in 2020. c. 07/2021: cath showing patent stents along RCA and mid-LAD with scattered 20% stenosis but no obstructive disease.   Dyslipidemia    Essential hypertension, benign    Morbid obesity (HCC)    Pancreatitis    Type 2 diabetes mellitus (HCC)    Past Surgical History:  Procedure Laterality Date   ABDOMINAL AORTOGRAM N/A 11/14/2023   Procedure: ABDOMINAL AORTOGRAM;  Surgeon: Sheree Penne Bruckner, MD;  Location: [facility] INVASIVE CV LAB;  Service: Cardiovascular;  Laterality: N/A;   ABDOMINAL HERNIA REPAIR     ABDOMINAL HYSTERECTOMY     AMPUTATION Left 01/25/2020   Procedure: LEFT BELOW KNEE AMPUTATION;  Surgeon: Harden Jerona GAILS, MD;  Location: [facility] OR;  Service: Orthopedics;  Laterality: Left;   CORONARY ANGIOPLASTY WITH STENT PLACEMENT  2006   KNEE SURGERY     LEFT HEART CATH AND CORONARY ANGIOGRAPHY N/A 08/14/2021   Procedure: LEFT HEART CATH AND CORONARY ANGIOGRAPHY;  Surgeon: Burnard Debby LABOR, MD;  Location: [facility] INVASIVE CV LAB;  Service: Cardiovascular;  Laterality: N/A;   LOWER EXTREMITY ANGIOGRAPHY Right 11/14/2023   Procedure: Lower Extremity Angiography;  Surgeon: Sheree Penne Bruckner, MD;  Location: [facility] INVASIVE CV LAB;  Service: Cardiovascular;  Laterality: Right;   NOSE SURGERY     Pilonidal cystectomy     TONSILLECTOMY     Social History:  reports that she quit smoking about 4 years ago. Her smoking use included cigarettes. She started smoking about 24 years ago. She has a 10 pack-year smoking history. She has never used smokeless tobacco. She reports that she does not drink alcohol and does not use drugs.  Allergies[1]  Family History  Problem Relation Age of Onset    Diabetes Mother    Heart failure Mother    Hypertension Mother    Kidney failure Mother    Ovarian cancer Mother    Asthma Son     Prior to Admission medications  Medication Sig Start Date End Date Taking? Authorizing Provider  Accu-Chek Softclix Lancets lancets Use as instructed to monitor glucose 4 times daily- use as backup to Dexcom 03/10/23   Therisa Benton PARAS, NP  albuterol  (VENTOLIN  HFA) 108 (90 Base) MCG/ACT inhaler Inhale 2 puffs into the lungs every 6 (six) hours as needed for wheezing. Shortness of breath 07/30/20   Elnor Lauraine BRAVO, NP  amoxicillin -clavulanate (AUGMENTIN ) 875-125 MG tablet Take 1 tablet by mouth 2 (two) times daily. 02/13/24   Standiford, Marsa FALCON, DPM  busPIRone  (BUSPAR ) 5 MG tablet Take 5 mg by mouth 2 (two) times daily. 08/08/23   [provider]  Cholecalciferol  1.25 MG (50000 UT) capsule Take 50,000 Units by mouth once a week. 09/12/23   [provider]  clopidogrel  (PLAVIX ) 75 MG tablet Take 1 tablet by mouth once daily 11/22/23   Okey Vina GAILS, MD  doxycycline  (VIBRAMYCIN ) 100 MG capsule Take 1 capsule (100 mg total) by mouth 2 (two) times daily. 01/08/24   Towana Ozell BROCKS, MD  EASY TOUCH INSULIN  SYRINGE 31G X 5/16 1 ML MISC  09/21/19   [provider]  EPINEPHrine  0.3 mg/0.3 mL IJ SOAJ injection Inject 0.3 mg into the muscle as needed for anaphylaxis. 05/15/20   Elnor Lauraine BRAVO, NP  gabapentin  (NEURONTIN ) 300 MG capsule Take 1 capsule (300 mg total) by mouth at bedtime. 11/15/23 11/14/24  Gonfa, Taye T, MD  glucose blood (ACCU-CHEK GUIDE TEST) test strip Use as instructed to monitor glucose 4 times daily- use as back up for Dexcom 03/10/23   Therisa Benton PARAS, NP  HYDROcodone -acetaminophen  (NORCO/VICODIN) 5-325 MG tablet Take 1 tablet by mouth every 6 (six) hours as needed. 01/08/24   Towana Ozell BROCKS, MD  Insulin  Pen Needle (B-D ULTRAFINE III SHORT PEN) 31G X 8 MM MISC USE AS DIRECTED 07/06/21   Jaycee Greig PARAS, NP  insulin  regular human  CONCENTRATED (HUMULIN  R U-500 KWIKPEN) 500 UNIT/ML KwikPen Inject 60 Units into the skin 3 (three) times daily with meals. 01/02/24   Therisa Benton PARAS, NP  isosorbide  mononitrate (IMDUR ) 30 MG 24 hr tablet Take 1 tablet (30 mg total) by mouth daily. 11/17/23 02/15/24  Johnson Laymon HERO, PA-C  levocetirizine (XYZAL ) 5 MG tablet Take 5 mg by mouth daily.    [provider]  metoprolol  tartrate (LOPRESSOR ) 25 MG tablet Take 0.5 tablets (12.5 mg total) by mouth 2 (two) times daily. 11/17/23 02/15/24  Strader, Laymon HERO, PA-C  nitroGLYCERIN  (NITROSTAT ) 0.4 MG SL tablet DISSOLVE ONE TABLET UNDER THE TONGUE EVERY 5 MINUTES AS NEEDED FOR CHEST PAIN.  DO NOT EXCEED A TOTAL OF 3 DOSES IN 15 MINUTES 09/07/22   Okey Vina GAILS, MD  nystatin  powder Apply 1 Application topically 3 (three) times daily. 08/20/23  Geroldine Berg, MD  pantoprazole  (PROTONIX ) 40 MG tablet Take 1 tablet (40 mg total) by mouth daily. 07/30/20   Elnor Lauraine BRAVO, NP  rosuvastatin  (CRESTOR ) 40 MG tablet Take 40 mg by mouth at bedtime. 04/16/21   [provider]  senna-docusate (SENOKOT-S) 8.6-50 MG tablet Take 1-2 tablets by mouth 2 (two) times daily between meals as needed for mild constipation or moderate constipation. 11/15/23   Gonfa, Taye T, MD  torsemide  (DEMADEX ) 20 MG tablet Take 2 tablets by mouth once daily 09/19/23   Okey Vina GAILS, MD  traMADol  (ULTRAM ) 50 MG tablet Take 50 mg by mouth every 8 (eight) hours. 10/25/23   [provider]  VRAYLAR  3 MG capsule Take 3 mg by mouth daily. Patient taking differently: Take 1.5 mg by mouth daily. 11/15/21   [provider]    Physical Exam: Vitals:   02/15/24 0232 02/15/24 0300 02/15/24 0330 02/15/24 0400  BP: (!) 163/77 (!) 153/51 (!) 152/64 (!) 164/55  Pulse: 63 64 63 86  Resp: 14 15 15 15   Temp:      TempSrc:      SpO2: 100% 100% 100% 97%  Weight:      Height:       General:  Awake and alert and oriented x3. Not in any acute distress.  HEENT: NCAT.   PERRLA. EOMI. Sclerae anicteric.  Moist mucosal membranes. Neck: Neck supple without lymphadenopathy. No carotid bruits. No masses palpated.  Cardiovascular: Regular rate with normal S1-S2 sounds. No murmurs, rubs or gallops auscultated. No JVD.  Respiratory: Clear breath sounds.  No accessory muscle use. Abdomen: Soft, nontender, nondistended. Active bowel sounds. No masses or hepatosplenomegaly  Skin: No rashes, lesions, or ulcerations.  Dry, warm to touch. Musculoskeletal: Left BKA.  Surrounding erythema and tenderness of right heel wound.  2+ dorsalis pedis and radial pulse on RLE. Psychiatric: Intact judgment and insight.  Mood appropriate to current condition. Neurologic: No focal neurological deficits. Strength is 5/5 x 4.  CN II - XII grossly intact.  Assessment and Plan: Diabetic foot ulcer with superimposed cellulitis Patient was started on IV vancomycin  and Zosyn , we shall continue with same at this time Podiatrist - Dr. Janit ([facility]) was consulted and recommended admitting patient to [facility] with plan to consult on patient on arrival [facility] Continue oxycodone  5 mg every 4 hours as needed for pain Continue wound care  Mild right lower extremity PAD Continue Plavix  and Crestor    OSA on CPAP  Continue CPAP at night   Tremor/diabetic neuropathy Continue home gabapentin .    Mood disorder Stable; Continue home buspirone   Essential hypertension Continue metoprolol , Imdur   Mixed hyperlipidemia Continue Crestor   History of CAD s/p DES to RCA in 2006 and another 2 stents in 2009   LHC in 2023 with nonobstructive CAD.  No cardiopulmonary symptoms. Continue home Plavix , metoprolol , Imdur  and Crestor .  Uncontrolled type 2 diabetes mellitus with hyperglycemia A1c 13.2 in September 2025 Continue Semglee  10 units nightly and adjust dose accordingly Continue ISS and hypoglycemia protocol  GERD Continue Protonix   Obesity class II (BMI 35.87) Diet and  lifestyle modification   Advance Care Planning: Full code  Consults: Podiatrist  Family Communication: Daughters at bedside  Severity of Illness: The appropriate patient status for this patient is INPATIENT. Inpatient status is judged to be reasonable and necessary in order to provide the required intensity of service to ensure the patient's safety. The patient's presenting symptoms, physical exam findings, and initial  radiographic and laboratory data in the context of their chronic comorbidities is felt to place them at high risk for further clinical deterioration. Furthermore, it is not anticipated that the patient will be medically stable for discharge from the hospital within 2 midnights of admission.   * I certify that at the point of admission it is my clinical judgment that the patient will require inpatient hospital care spanning beyond 2 midnights from the point of admission due to high intensity of service, high risk for further deterioration and high frequency of surveillance required.*  Author: Nikole Swartzentruber, DO 02/15/2024 4:29 AM  For on call review [email or web address].      [1]  Allergies Allergen Reactions   Aspirin  Anaphylaxis and Other (See Comments)    Throat closing    Bee Venom Anaphylaxis   Pineapple Anaphylaxis   Definity  [Perflutren  Lipid Microsphere]     Muscle Aches   E-Mycin [Erythromycin Base] Nausea And Vomiting   "

## 2024-02-15 NOTE — Telephone Encounter (Signed)
 Spoke to patients daughter patient in the hospital at this time.

## 2024-02-15 NOTE — Consult Note (Signed)
 "  PODIATRY CONSULTATION  NAME Nicole [name] MRN [id number] DOB 06-07-1965 DOA 02/14/2024   Reason for consult: Osteomyelitis calcaneus right Chief Complaint  Patient presents with   Wound Check    History of present illness: 58 y.o. female PMHx T2DM, last recorded A1c 11/10/2023 was 13.2, BKA LLE, admitted for worsening infection to a chronic right posterior heel wound.  Past Medical History:  Diagnosis Date   CAP (community acquired pneumonia)    Streptococcus 01/2011   COPD (chronic obstructive pulmonary disease) (HCC)    Coronary atherosclerosis of native coronary artery    a. Diagnosed [place]  2006 - DES RCA, reports followup cath 2009 at [facility] b. reported 2 stents in [place]  in 2020. c. 07/2021: cath showing patent stents along RCA and mid-LAD with scattered 20% stenosis but no obstructive disease.   Dyslipidemia    Essential hypertension, benign    Morbid obesity (HCC)    Pancreatitis    Type 2 diabetes mellitus (HCC)        Latest Ref Rng & Units 02/15/2024    1:08 AM 01/08/2024   10:03 PM 11/14/2023    7:58 PM  CBC  WBC 4.0 - 10.5 K/uL 9.5  6.1  7.1   Hemoglobin 12.0 - 15.0 g/dL 89.4  89.0  88.9   Hematocrit 36.0 - 46.0 % 32.8  33.1  33.9   Platelets 150 - 400 K/uL 152  156  150        Latest Ref Rng & Units 02/15/2024    1:08 AM 01/08/2024   10:03 PM 11/14/2023    3:56 AM  BMP  Glucose 70 - 99 mg/dL 841  769  794   BUN 6 - 20 mg/dL 25  17  16    Creatinine 0.44 - 1.00 mg/dL 9.12  9.15  9.08   Sodium 135 - 145 mmol/L 141  141  137   Potassium 3.5 - 5.1 mmol/L 4.1  4.1  4.3   Chloride 98 - 111 mmol/L 103  102  101   CO2 22 - 32 mmol/L 32  29  26   Calcium  8.9 - 10.3 mg/dL 9.3  9.2  9.3     RT posterior heel 02/15/2024   Focused exam: General: The patient is alert and oriented x3 in no acute distress.   Dermatology: Chronic right posterior heel wound with surrounding erythema and underlying osteomyelitis  Vascular:  Procedures: PERIPHERAL  VASCULAR CATHETERIZATION  Height: 5' 4 (1.626 m)  Weight: 114.3 kg  Blood Pressure: 118/52  Heart Rate: 58   Accession Number: [id number]  Date of Study: 11/14/23  Ordering Provider: Sheree Penne Bruckner, MD   Clinical Indications: claudication      Performing Physician  Performing Staff  Primary: Sheree Penne Bruckner, MD     Findings: The aorta is patent with 2 renal arteries on the right and 1 on the left which are also patent.  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  The iliac segments are all patent.  Bilateral common femoral arteries branching to profundus and superficial femoral arteries which are patent.  The right side is the site of interest there is brisk flow through 3 vessels and the posterior tibial artery is noted to be filling the heel.  No intervention was undertaken.   Neurological: Light touch and protective threshold absent  Musculoskeletal Exam: History of BKA LLE  MR FOOT RIGHT W WO CONTRAST (Accession [id number]) (Order [id number]) Imaging Date: 02/15/2024  Department: [facility] 5C GENERAL MED/SURG UNIT  IMPRESSION: 1. Soft tissue wound at the medial heel extends deep to the underlying posterior calcaneal tuberosity with associated cortical distinctness and marrow signal abnormality compatible with osteomyelitis. Associated surrounding cellulitis. No loculated fluid collection. 2. Slightly increased thickening of the distal Achilles tendon may reflect tendinosis. 3. Chronic thickening of the central cord of the plantar fascia.  ASSESSMENT/PLAN OF CARE Right heel wound with underlying osteomyelitis secondary to uncontrolled diabetes melitis  -Patient was seen at bedside this evening.  Also spoke with her daughter, [name], via telephone. -Discussed the findings of osteomyelitis to the calcaneus based on MRI.  I do believe the patient would benefit from surgical debridement, partial excision of calcaneal  bone, placement of antibiotic beads, and application of wound VAC.  This is an attempt at limb salvage due to the detrimental effects if she were to lose the right leg as well resulting in bilateral BKA.  This was discussed in detail with the patient as well as the daughter who are amenable to surgical intervention in an attempt to facilitate wound healing and eradicate the osteomyelitis. -Surgery tentatively planned for Friday, 02/17/2024.  Preoperative orders placed.  NPO after midnight Thurs night.  -Continue IV antibiotics -Will follow     Thank you for the consult.  Please contact me directly via secure chat with any questions or concerns.     Thresa EMERSON Sar, DPM [facility]  Dr. Thresa EMERSON Sar, DPM    [address] [place], [place] [address]                Office [phone]  Fax [phone]     "

## 2024-02-16 DIAGNOSIS — S91301A Unspecified open wound, right foot, initial encounter: Secondary | ICD-10-CM | POA: Diagnosis not present

## 2024-02-16 LAB — GLUCOSE, CAPILLARY
Glucose-Capillary: 330 mg/dL — ABNORMAL HIGH (ref 70–99)
Glucose-Capillary: 236 mg/dL — ABNORMAL HIGH (ref 70–99)
Glucose-Capillary: 299 mg/dL — ABNORMAL HIGH (ref 70–99)
Glucose-Capillary: 300 mg/dL — ABNORMAL HIGH (ref 70–99)

## 2024-02-16 LAB — CBC
HCT: 31 % — ABNORMAL LOW (ref 36.0–46.0)
Hemoglobin: 10 g/dL — ABNORMAL LOW (ref 12.0–15.0)
MCH: 29.9 pg (ref 26.0–34.0)
MCHC: 32.3 g/dL (ref 30.0–36.0)
MCV: 92.8 fL (ref 80.0–100.0)
Platelets: 138 K/uL — ABNORMAL LOW (ref 150–400)
RBC: 3.34 MIL/uL — ABNORMAL LOW (ref 3.87–5.11)
RDW: 14 % (ref 11.5–15.5)
WBC: 6.7 K/uL (ref 4.0–10.5)
nRBC: 0 % (ref 0.0–0.2)

## 2024-02-16 LAB — COMPREHENSIVE METABOLIC PANEL WITH GFR
ALT: 19 U/L (ref 0–44)
AST: 29 U/L (ref 15–41)
Albumin: 3.2 g/dL — ABNORMAL LOW (ref 3.5–5.0)
Alkaline Phosphatase: 71 U/L (ref 38–126)
Anion gap: 8 (ref 5–15)
BUN: 19 mg/dL (ref 6–20)
CO2: 29 mmol/L (ref 22–32)
Calcium: 8.7 mg/dL — ABNORMAL LOW (ref 8.9–10.3)
Chloride: 97 mmol/L — ABNORMAL LOW (ref 98–111)
Creatinine, Ser: 0.76 mg/dL (ref 0.44–1.00)
GFR, Estimated: >60 mL/min
Glucose, Bld: 358 mg/dL — ABNORMAL HIGH (ref 70–99)
Potassium: 3.9 mmol/L (ref 3.5–5.1)
Sodium: 134 mmol/L — ABNORMAL LOW (ref 135–145)
Total Bilirubin: 0.4 mg/dL (ref 0.0–1.2)
Total Protein: 6.1 g/dL — ABNORMAL LOW (ref 6.5–8.1)

## 2024-02-16 LAB — PHOSPHORUS: Phosphorus: 3 mg/dL (ref 2.5–4.6)

## 2024-02-16 LAB — MAGNESIUM: Magnesium: 1.5 mg/dL — ABNORMAL LOW (ref 1.7–2.4)

## 2024-02-16 NOTE — Progress Notes (Signed)
 " PROGRESS NOTE    NAILANI FULL  FMW:[id number] DOB: 1966/01/22 DOA: 02/14/2024 PCP: Shona Norleen PEDLAR, MD   Brief Narrative:  Patient is a 58 year old female with known history of hypertension hyperlipidemia GERD CAD diabetes type 2, peripheral arterial disease status post left BKA, COPD on room air sleep apnea noncompliant with CPAP and A-fib not currently on anticoagulation.  Patient reports to our facility with worsening right heel wound followed outpatient by Dr. Malvin where she has been having local wound debridement with no improvement.  Of note she was recently admitted to our facility on 9/17 to 9/23 for the same infection - at that time vascular surgery was consulted and recommended BKA, patient refused surgery at that time.   Assessment & Plan:   Principal Problem:   Non-healing open wound of right heel  Chronic diabetic foot ulcer with acute superimposed cellulitis, POA Peripheral artery disease, chronic, moderate to severe - Transition to Unasyn +linezolid  per discussion with pharmacy - Podiatry following, tentative plan for surgical debridement, partial excision of calcaneal bone and placement of antibiotic beads and wound VAC on 02/17/2024. - Pain currently well-controlled on oxycodone  5 mg every 4 hour - Wound care to follow - Continue Plavix , rosuvastatin   OSA on CPAP - Continue CPAP at night Tremor/diabetic neuropathy -chronic, continue home gabapentin .  Mood disorder, unspecified - Stable; Continue home buspirone  Essential hypertension - Continue metoprolol , Imdur  Mixed hyperlipidemia - Continue Crestor  History of CAD s/p DES to RCA in 2006 and another 2 stents in 2009  LHC in 2023 with nonobstructive CAD. Continue home Plavix , metoprolol , Imdur  and Crestor .   Uncontrolled type 2 diabetes mellitus with hyperglycemia -Most recent A1c 13.2 a few months prior, no need to recheck at this point given longstanding history of profoundly uncontrolled  diabetes -Continue long-acting insulin , sliding scale as well as hypoglycemia protocol.  Labs improving somewhat over the last 24 hours  GERD - Continue Protonix  Obesity class II (BMI 35.87) - Diet and lifestyle modification discussed and recommended    DVT prophylaxis: enoxaparin  (LOVENOX ) injection 40 mg Start: 02/15/24 1200 SCDs Start: 02/15/24 0741 Code Status:   Code Status: Full Code Family Communication: None present  Status is: Inpatient  Dispo: The patient is from: Home              Anticipated d/c is to: To be determined              Anticipated d/c date is: And to be determined              Patient currently not medically stable for discharge  Consultants:  Podiatry  Procedures:  Pending above  Antimicrobials:  Unasyn , linezolid   Subjective: No acute issues or events overnight denies nausea vomiting diarrhea constipation headache fevers chills or chest pain  Objective: Vitals:   02/15/24 2126 02/15/24 2330 02/16/24 0017 02/16/24 0456  BP: (!) 153/53  (!) 130/52 (!) 144/62  Pulse: 64  61 62  Resp: 17  16 18   Temp: 98.4 F (36.9 C)  98 F (36.7 C) 98.7 F (37.1 C)  TempSrc: Oral   Oral  SpO2: 100% 97% 97% 98%  Weight:      Height:        Intake/Output Summary (Last 24 hours) at 02/16/2024 0752 Last data filed at 02/15/2024 2026 Gross per 24 hour  Intake 733.66 ml  Output 1450 ml  Net -716.34 ml   Filed Weights   02/14/24 2224  Weight: 94.8 kg    Examination:  General:  Pleasantly resting in bed, No acute distress. HEENT:  Normocephalic atraumatic.  Sclerae nonicteric, noninjected.  Extraocular movements intact bilaterally. Neck:  Without mass or deformity.  Trachea is midline. Lungs:  Clear to auscultate bilaterally without rhonchi, wheeze, or rales. Heart:  Regular rate and rhythm.  Without murmurs, rubs, or gallops. Abdomen:  Soft, nontender, nondistended.  Without guarding or rebound. Extremities: Without cyanosis, clubbing, L BKA  noted Skin: See below:   Data Reviewed: I have personally reviewed following labs and imaging studies  CBC: Recent Labs  Lab 02/15/24 0108  WBC 9.5  NEUTROABS 5.8  HGB 10.5*  HCT 32.8*  MCV 94.3  PLT 152   Basic Metabolic Panel: Recent Labs  Lab 02/15/24 0108  NA 141  K 4.1  CL 103  CO2 32  GLUCOSE 158*  BUN 25*  CREATININE 0.87  CALCIUM  9.3  MG 1.8   GFR: Estimated Creatinine Clearance: 77.7 mL/min (by C-G formula based on SCr of 0.87 mg/dL). Liver Function Tests: Recent Labs  Lab 02/15/24 0108  AST 44*  ALT 23  ALKPHOS 91  BILITOT 0.4  PROT 6.6  ALBUMIN 3.5   No results for input(s): LIPASE, AMYLASE in the last 168 hours. No results for input(s): AMMONIA in the last 168 hours. Coagulation Profile: Recent Labs  Lab 02/15/24 0108  INR 1.0   Cardiac Enzymes: No results for input(s): CKTOTAL, CKMB, CKMBINDEX, TROPONINI in the last 168 hours. BNP (last 3 results) No results for input(s): PROBNP in the last 8760 hours. HbA1C: No results for input(s): HGBA1C in the last 72 hours. CBG: Recent Labs  Lab 02/15/24 0801 02/15/24 1207 02/15/24 1616 02/15/24 2231  GLUCAP 248* 235* 295* 240*   Lipid Profile: No results for input(s): CHOL, HDL, LDLCALC, TRIG, CHOLHDL, LDLDIRECT in the last 72 hours. Thyroid  Function Tests: No results for input(s): TSH, T4TOTAL, FREET4, T3FREE, THYROIDAB in the last 72 hours. Anemia Panel: No results for input(s): VITAMINB12, FOLATE, FERRITIN, TIBC, IRON, RETICCTPCT in the last 72 hours. Sepsis Labs: Recent Labs  Lab 02/15/24 0108  LATICACIDVEN 1.9    Recent Results (from the past 240 hours)  Blood Culture (routine x 2)     Status: None (Preliminary result)   Collection Time: 02/15/24  1:08 AM   Specimen: BLOOD  Result Value Ref Range Status   Specimen Description BLOOD BLOOD LEFT ARM  Final   Special Requests   Final    BOTTLES DRAWN AEROBIC AND ANAEROBIC  Blood Culture adequate volume   Culture   Final    NO GROWTH 1 DAY Performed at [facility], [address], [place], [place] [address]    Report Status PENDING  Incomplete  Blood Culture (routine x 2)     Status: None (Preliminary result)   Collection Time: 02/15/24  1:11 AM   Specimen: BLOOD  Result Value Ref Range Status   Specimen Description BLOOD BLOOD RIGHT ARM  Final   Special Requests AEROBIC BOTTLE ONLY Blood Culture adequate volume  Final   Culture   Final    NO GROWTH 1 DAY Performed at [facility], [address] [place], [place] [address]    Report Status PENDING  Incomplete   Radiology Studies: MR FOOT RIGHT W WO CONTRAST Result Date: 02/15/2024 CLINICAL DATA:  Heel wound. Concern for osteomyelitis. History of diabetes. EXAM: MRI OF THE RIGHT FOREFOOT WITHOUT AND WITH CONTRAST TECHNIQUE: Multiplanar, multisequence MR imaging of the right forefoot was performed before and after the administration of intravenous contrast.  CONTRAST:  10mL GADAVIST  GADOBUTROL  1 MMOL/ML IV SOLN COMPARISON:  Right foot radiographs dated 02/15/2024. MRI of the right foot dated 11/10/2023. FINDINGS: Bones/Joint/Cartilage Soft tissue wound at the medial heel tracks deep to the level of the calcaneal tuberosity undersurface which demonstrates cortical indistinctness with T2 hyperintense marrow signal abnormality and corresponding T1 hypointensity and enhancement on postcontrast sequences, compatible with osteomyelitis. No fracture or dislocation. Mild-to-moderate degenerative arthropathy of the hindfoot and midfoot. No joint effusion. Ligaments Lateral and medial ankle ligaments are intact. Muscles and Tendons Flexor, peroneal and extensor compartment tendons are intact. Chronic thickening of the central cord of the plantar fascia. Slightly increased thickening of the distal Achilles tendon may reflect tendinosis. Calcaneal enthesopathy at the insertion of the Achilles tendon and the origin of the central  cord of the plantar fascia. Soft tissue Soft tissue wound at the medial heel with surrounding soft tissue edema, enhancement and cutaneous thickening, compatible with cellulitis. No loculated fluid collection. IMPRESSION: 1. Soft tissue wound at the medial heel extends deep to the underlying posterior calcaneal tuberosity with associated cortical distinctness and marrow signal abnormality compatible with osteomyelitis. Associated surrounding cellulitis. No loculated fluid collection. 2. Slightly increased thickening of the distal Achilles tendon may reflect tendinosis. 3. Chronic thickening of the central cord of the plantar fascia. Electronically Signed   By: Harrietta Sherry M.D.   On: 02/15/2024 12:02   DG Foot 2 Views Right Result Date: 02/15/2024 CLINICAL DATA:  Heel wound. EXAM: RIGHT FOOT - 2 VIEW COMPARISON:  01/08/2024 FINDINGS: Skin defect posterior to the calcaneus. No tracking soft tissue gas. Question of mild periosteal thickening subjacent ulcer, not seen on prior. No frank bony destruction. Again seen plantar calcaneal spur and Achilles tendon enthesophyte. Hammertoe deformity of the toes. Midfoot degenerative spurring. IMPRESSION: Skin defect posterior to the calcaneus. Question of mild periosteal thickening subjacent to the ulcer, not seen on prior. No frank bony destruction. Electronically Signed   By: Andrea Gasman M.D.   On: 02/15/2024 00:49   Scheduled Meds:  busPIRone   5 mg Oral BID   clopidogrel   75 mg Oral Daily   enoxaparin  (LOVENOX ) injection  40 mg Subcutaneous Q24H   gabapentin   300 mg Oral QHS   insulin  aspart  0-15 Units Subcutaneous TID WC   insulin  aspart  0-5 Units Subcutaneous QHS   insulin  glargine  10 Units Subcutaneous QHS   isosorbide  mononitrate  30 mg Oral Daily   metoprolol  tartrate  12.5 mg Oral BID   pantoprazole   40 mg Oral Daily   rosuvastatin   40 mg Oral QHS   Continuous Infusions:  ampicillin -sulbactam (UNASYN ) IV 3 g (02/16/24 0518)   linezolid   (ZYVOX ) IV 600 mg (02/15/24 2232)     LOS: 1 day   Time spent:  Elsie JAYSON Montclair, DO [facility]  If 7PM-7AM, please contact night-coverage [email or web address]  02/16/2024, 7:52 AM      "

## 2024-02-16 NOTE — Plan of Care (Addendum)
 Plavix  held per Thresa Sar, MD (Podiatry). Right foot dressing done. No complaints during the shift.   Problem: Education: Goal: Knowledge of General Education information will improve Description: Including pain rating scale, medication(s)/side effects and non-pharmacologic comfort measures Outcome: Progressing   Problem: Health Behavior/Discharge Planning: Goal: Ability to manage health-related needs will improve Outcome: Progressing   Problem: Clinical Measurements: Goal: Ability to maintain clinical measurements within normal limits will improve Outcome: Progressing Goal: Will remain free from infection Outcome: Progressing Goal: Diagnostic test results will improve Outcome: Progressing Goal: Respiratory complications will improve Outcome: Progressing Goal: Cardiovascular complication will be avoided Outcome: Progressing   Problem: Activity: Goal: Risk for activity intolerance will decrease Outcome: Progressing   Problem: Nutrition: Goal: Adequate nutrition will be maintained Outcome: Progressing   Problem: Coping: Goal: Level of anxiety will decrease Outcome: Progressing   Problem: Elimination: Goal: Will not experience complications related to bowel motility Outcome: Progressing Goal: Will not experience complications related to urinary retention Outcome: Progressing   Problem: Pain Managment: Goal: General experience of comfort will improve and/or be controlled Outcome: Progressing   Problem: Safety: Goal: Ability to remain free from injury will improve Outcome: Progressing   Problem: Skin Integrity: Goal: Risk for impaired skin integrity will decrease Outcome: Progressing   Problem: Education: Goal: Ability to describe self-care measures that may prevent or decrease complications (Diabetes Survival Skills Education) will improve Outcome: Progressing Goal: Individualized Educational Video(s) Outcome: Progressing   Problem: Coping: Goal: Ability to  adjust to condition or change in health will improve Outcome: Progressing   Problem: Fluid Volume: Goal: Ability to maintain a balanced intake and output will improve Outcome: Progressing   Problem: Health Behavior/Discharge Planning: Goal: Ability to identify and utilize available resources and services will improve Outcome: Progressing Goal: Ability to manage health-related needs will improve Outcome: Progressing   Problem: Metabolic: Goal: Ability to maintain appropriate glucose levels will improve Outcome: Progressing   Problem: Nutritional: Goal: Maintenance of adequate nutrition will improve Outcome: Progressing Goal: Progress toward achieving an optimal weight will improve Outcome: Progressing   Problem: Skin Integrity: Goal: Risk for impaired skin integrity will decrease Outcome: Progressing   Problem: Tissue Perfusion: Goal: Adequacy of tissue perfusion will improve Outcome: Progressing

## 2024-02-17 ENCOUNTER — Inpatient Hospital Stay (HOSPITAL_COMMUNITY): Admitting: Anesthesiology

## 2024-02-17 ENCOUNTER — Encounter (HOSPITAL_COMMUNITY): Payer: Self-pay | Admitting: Internal Medicine

## 2024-02-17 ENCOUNTER — Inpatient Hospital Stay (HOSPITAL_COMMUNITY)

## 2024-02-17 ENCOUNTER — Encounter (HOSPITAL_COMMUNITY)
Admission: EM | Disposition: A | Discharge status: Home Health | Payer: Self-pay | Source: Home / Self Care | Attending: Internal Medicine

## 2024-02-17 ENCOUNTER — Other Ambulatory Visit: Payer: Self-pay

## 2024-02-17 DIAGNOSIS — I2510 Atherosclerotic heart disease of native coronary artery without angina pectoris: Secondary | ICD-10-CM

## 2024-02-17 DIAGNOSIS — M869 Osteomyelitis, unspecified: Secondary | ICD-10-CM | POA: Diagnosis not present

## 2024-02-17 DIAGNOSIS — I509 Heart failure, unspecified: Secondary | ICD-10-CM | POA: Diagnosis not present

## 2024-02-17 DIAGNOSIS — I110 Hypertensive heart disease with heart failure: Secondary | ICD-10-CM | POA: Diagnosis not present

## 2024-02-17 DIAGNOSIS — E1169 Type 2 diabetes mellitus with other specified complication: Secondary | ICD-10-CM | POA: Diagnosis not present

## 2024-02-17 DIAGNOSIS — M86172 Other acute osteomyelitis, left ankle and foot: Secondary | ICD-10-CM | POA: Diagnosis not present

## 2024-02-17 DIAGNOSIS — S91301A Unspecified open wound, right foot, initial encounter: Secondary | ICD-10-CM | POA: Diagnosis not present

## 2024-02-17 HISTORY — PX: INCISION AND DRAINAGE OF WOUND: SHX1803

## 2024-02-17 LAB — CBC
HCT: 30.4 % — ABNORMAL LOW (ref 36.0–46.0)
Hemoglobin: 10.2 g/dL — ABNORMAL LOW (ref 12.0–15.0)
MCH: 30.4 pg (ref 26.0–34.0)
MCHC: 33.6 g/dL (ref 30.0–36.0)
MCV: 90.7 fL (ref 80.0–100.0)
Platelets: 144 K/uL — ABNORMAL LOW (ref 150–400)
RBC: 3.35 MIL/uL — ABNORMAL LOW (ref 3.87–5.11)
RDW: 13.9 % (ref 11.5–15.5)
WBC: 6.4 K/uL (ref 4.0–10.5)
nRBC: 0 % (ref 0.0–0.2)

## 2024-02-17 LAB — GLUCOSE, CAPILLARY
Glucose-Capillary: 216 mg/dL — ABNORMAL HIGH (ref 70–99)
Glucose-Capillary: 195 mg/dL — ABNORMAL HIGH (ref 70–99)
Glucose-Capillary: 235 mg/dL — ABNORMAL HIGH (ref 70–99)
Glucose-Capillary: 161 mg/dL — ABNORMAL HIGH (ref 70–99)
Glucose-Capillary: 184 mg/dL — ABNORMAL HIGH (ref 70–99)
Glucose-Capillary: 263 mg/dL — ABNORMAL HIGH (ref 70–99)

## 2024-02-17 LAB — BASIC METABOLIC PANEL WITH GFR
Anion gap: 7 (ref 5–15)
BUN: 16 mg/dL (ref 6–20)
CO2: 31 mmol/L (ref 22–32)
Calcium: 9.2 mg/dL (ref 8.9–10.3)
Chloride: 99 mmol/L (ref 98–111)
Creatinine, Ser: 0.68 mg/dL (ref 0.44–1.00)
GFR, Estimated: >60 mL/min
Glucose, Bld: 221 mg/dL — ABNORMAL HIGH (ref 70–99)
Potassium: 3.9 mmol/L (ref 3.5–5.1)
Sodium: 138 mmol/L (ref 135–145)

## 2024-02-17 LAB — HEMOGLOBIN A1C
Hgb A1c MFr Bld: 9.2 % — ABNORMAL HIGH (ref 4.8–5.6)
Mean Plasma Glucose: 217.34 mg/dL

## 2024-02-17 MED ORDER — CHLORHEXIDINE GLUCONATE 0.12 % MT SOLN
15.0000 mL | Freq: Once | OROMUCOSAL | Status: DC
  Administered 2024-02-17: 15 mL via OROMUCOSAL
  Filled 2024-02-17: qty 15

## 2024-02-17 MED ORDER — SODIUM CHLORIDE 0.9 % IR SOLN
Status: DC | PRN
  Administered 2024-02-17: 1000 mL

## 2024-02-17 MED ORDER — LACTATED RINGERS IV SOLN: INTRAVENOUS | Status: DC | PRN

## 2024-02-17 MED ORDER — SUGAMMADEX SODIUM 200 MG/2ML IV SOLN
INTRAVENOUS | Status: DC | PRN
  Administered 2024-02-17: 189.6 mg via INTRAVENOUS

## 2024-02-17 MED ORDER — LIDOCAINE 2% (20 MG/ML) 5 ML SYRINGE
INTRAMUSCULAR | Status: DC | PRN
  Administered 2024-02-17: 80 mg via INTRAVENOUS

## 2024-02-17 MED ORDER — PROPOFOL 10 MG/ML IV BOLUS
INTRAVENOUS | Status: DC | PRN
  Administered 2024-02-17: 100 mg via INTRAVENOUS

## 2024-02-17 MED ORDER — PROPOFOL 10 MG/ML IV BOLUS
INTRAVENOUS | Status: AC
  Filled 2024-02-17: qty 20

## 2024-02-17 MED ORDER — VANCOMYCIN HCL 1000 MG IV SOLR
INTRAVENOUS | Status: AC
  Filled 2024-02-17: qty 20

## 2024-02-17 MED ORDER — PHENYLEPHRINE 80 MCG/ML (10ML) SYRINGE FOR IV PUSH (FOR BLOOD PRESSURE SUPPORT)
PREFILLED_SYRINGE | INTRAVENOUS | Status: DC | PRN
  Administered 2024-02-17: 80 ug via INTRAVENOUS

## 2024-02-17 MED ORDER — ROCURONIUM BROMIDE 10 MG/ML (PF) SYRINGE
PREFILLED_SYRINGE | INTRAVENOUS | Status: DC | PRN
  Administered 2024-02-17: 50 mg via INTRAVENOUS

## 2024-02-17 MED ORDER — ENOXAPARIN SODIUM 60 MG/0.6ML IJ SOSY
50.0000 mg | PREFILLED_SYRINGE | INTRAMUSCULAR | Status: DC
  Administered 2024-02-18 – 2024-02-24 (×7): 50 mg via SUBCUTANEOUS
  Filled 2024-02-17 (×3): qty 0.6

## 2024-02-17 MED ORDER — VANCOMYCIN HCL 1000 MG IV SOLR
INTRAVENOUS | Status: DC | PRN
  Administered 2024-02-17: 1000 mg via TOPICAL

## 2024-02-17 MED ORDER — FENTANYL CITRATE (PF) 100 MCG/2ML IJ SOLN
INTRAMUSCULAR | Status: DC | PRN
  Administered 2024-02-17: 100 ug via INTRAVENOUS

## 2024-02-17 MED ORDER — FENTANYL CITRATE (PF) 100 MCG/2ML IJ SOLN
INTRAMUSCULAR | Status: AC
  Filled 2024-02-17: qty 2

## 2024-02-17 MED ORDER — ONDANSETRON HCL 4 MG/2ML IJ SOLN
INTRAMUSCULAR | Status: DC | PRN
  Administered 2024-02-17: 4 mg via INTRAVENOUS

## 2024-02-17 MED ORDER — INSULIN ASPART 100 UNIT/ML IJ SOLN
0.0000 [IU] | INTRAMUSCULAR | Status: DC | PRN
  Administered 2024-02-17: 6 [IU] via SUBCUTANEOUS
  Filled 2024-02-17: qty 6

## 2024-02-17 MED ORDER — TOBRAMYCIN SULFATE 1.2 G IJ SOLR
INTRAMUSCULAR | Status: AC
  Filled 2024-02-17: qty 1.2

## 2024-02-17 MED ORDER — TOBRAMYCIN SULFATE 1.2 G IJ SOLR
INTRAMUSCULAR | Status: DC | PRN
  Administered 2024-02-17: 1.2 g via TOPICAL

## 2024-02-17 MED ORDER — DROPERIDOL 2.5 MG/ML IJ SOLN: 0.6250 mg | Freq: Once | INTRAMUSCULAR | Status: DC | PRN

## 2024-02-17 MED ORDER — ACETAMINOPHEN 500 MG PO TABS
1000.0000 mg | ORAL_TABLET | Freq: Once | ORAL | Status: AC
  Administered 2024-02-17: 1000 mg via ORAL
  Filled 2024-02-17: qty 2

## 2024-02-17 MED ORDER — FENTANYL CITRATE (PF) 100 MCG/2ML IJ SOLN
25.0000 ug | INTRAMUSCULAR | Status: DC | PRN
  Administered 2024-02-17: 50 ug via INTRAVENOUS

## 2024-02-17 MED ORDER — BUPIVACAINE HCL (PF) 0.5 % IJ SOLN
INTRAMUSCULAR | Status: AC
  Filled 2024-02-17: qty 30

## 2024-02-17 MED ORDER — ORAL CARE MOUTH RINSE: 15.0000 mL | Freq: Once | OROMUCOSAL | Status: DC

## 2024-02-17 MED ORDER — LIDOCAINE HCL 2 % IJ SOLN
INTRAMUSCULAR | Status: AC
  Filled 2024-02-17: qty 20

## 2024-02-17 MED ORDER — LACTATED RINGERS IV SOLN: INTRAVENOUS | Status: DC

## 2024-02-17 NOTE — Brief Op Note (Signed)
 02/17/2024  2:52 PM  PATIENT:  Nicole [name]  58 y.o. female  PRE-OPERATIVE DIAGNOSIS:  osteo right calc  POST-OPERATIVE DIAGNOSIS:  osteo right calc  PROCEDURE:  Procedures with comments: IRRIGATION AND DEBRIDEMENT WOUND (Right) - PARTIAL EXCISION HEEL WITH APPLICATION OF WOUND VAC  SURGEON:  Surgeons and Role:    DEWAINE [name] Nicole HERO, DPM - Primary  PHYSICIAN ASSISTANT: None  ASSISTANTS: none   ANESTHESIA:   general  EBL:  minimal   BLOOD ADMINISTERED:none  DRAINS: none   LOCAL MEDICATIONS USED:  NONE  SPECIMEN:  Source of Specimen:  Soft tissue culture RT heel.  Bone culture RT calcaneus  DISPOSITION OF SPECIMEN:  PATHOLOGY  COUNTS:  YES  TOURNIQUET:   Total Tourniquet Time Documented: area (laterality) - 45 minutes Total: area (laterality) - 45 minutes   DICTATION: .[place] Dictation  PLAN OF CARE: Admit to inpatient   PATIENT DISPOSITION:  PACU - hemodynamically stable.   Delay start of Pharmacological VTE agent (>24hrs) due to surgical blood loss or risk of bleeding: no  Nicole [name], DPM [facility]  Dr. Thresa EMERSON [name], DPM    [address] [place], [place] [address]                Office [phone]  Fax [phone]

## 2024-02-17 NOTE — Progress Notes (Signed)
 " PROGRESS NOTE    Nicole [name]  FMW:[id number] DOB: May 25, 1965 DOA: 02/14/2024 PCP: Shona Norleen PEDLAR, MD   Brief Narrative:  Patient is a 58 year old female with known history of hypertension hyperlipidemia GERD CAD diabetes type 2, peripheral arterial disease status post left BKA, COPD on room air sleep apnea noncompliant with CPAP and A-fib not currently on anticoagulation.  Patient reports to our facility with worsening right heel wound followed outpatient by Dr. Malvin where she has been having local wound debridement with no improvement.  Of note she was recently admitted to our facility on 9/17 to 9/23 for the same infection - at which time vascular surgery was consulted and recommended BKA, patient refused surgery at that time.  Assessment & Plan:   Principal Problem:   Non-healing open wound of right heel  Chronic diabetic foot ulcer with acute superimposed cellulitis, POA Peripheral artery disease, chronic, moderate to severe - Transition to Unasyn +linezolid  per discussion with pharmacy - Podiatry following, tentative plan for surgical debridement, partial excision of calcaneal bone and placement of antibiotic beads and wound VAC on 02/17/2024. - Pain currently well-controlled on oxycodone  5 mg every 4 hour - Wound care to follow - Continue Plavix , rosuvastatin   OSA on CPAP - Continue CPAP at night Tremor/diabetic neuropathy -chronic, continue home gabapentin .  Mood disorder, unspecified - Stable; Continue home buspirone  Essential hypertension - Continue metoprolol , Imdur  Mixed hyperlipidemia - Continue Crestor  History of CAD s/p DES to RCA in 2006 and another 2 stents in 2009  LHC in 2023 with nonobstructive CAD. Continue home Plavix , metoprolol , Imdur  and Crestor .   Uncontrolled type 2 diabetes mellitus with hyperglycemia -Most recent A1c 13.2 a few months prior, no need to recheck at this point given longstanding history of profoundly uncontrolled diabetes -Continue  long-acting insulin , sliding scale as well as hypoglycemia protocol.  Labs improving somewhat over the last 24 hours  GERD - Continue Protonix  Obesity class II (BMI 35.87) - Diet and lifestyle modification discussed and recommended    DVT prophylaxis: SCDs Start: 02/15/24 0741 Code Status:   Code Status: Full Code Family Communication: None present  Status is: Inpatient  Dispo: The patient is from: Home              Anticipated d/c is to: To be determined              Anticipated d/c date is: And to be determined              Patient currently not medically stable for discharge  Consultants:  Podiatry  Procedures:  Pending above  Antimicrobials:  Unasyn , linezolid   Subjective: No acute issues or events overnight denies nausea vomiting diarrhea constipation headache fevers chills or chest pain  Objective: Vitals:   02/16/24 1942 02/16/24 2301 02/17/24 0254 02/17/24 0719  BP: (!) 149/86  (!) 149/58 (!) 141/52  Pulse: 66 61 (!) 51 (!) 56  Resp: 18 18 18 18   Temp: 98.3 F (36.8 C)  98 F (36.7 C) 98.4 F (36.9 C)  TempSrc: Oral  Oral Oral  SpO2: 98% 95% 95% 95%  Weight:      Height:        Intake/Output Summary (Last 24 hours) at 02/17/2024 0818 Last data filed at 02/16/2024 2118 Gross per 24 hour  Intake 996 ml  Output 1500 ml  Net -504 ml   Filed Weights   02/14/24 2224  Weight: 94.8 kg    Examination:  General:  Pleasantly resting in bed, No  acute distress. HEENT:  Normocephalic atraumatic.  Sclerae nonicteric, noninjected.  Extraocular movements intact bilaterally. Neck:  Without mass or deformity.  Trachea is midline. Lungs:  Clear to auscultate bilaterally without rhonchi, wheeze, or rales. Heart:  Regular rate and rhythm.  Without murmurs, rubs, or gallops. Abdomen:  Soft, nontender, nondistended.  Without guarding or rebound. Extremities: Without cyanosis, clubbing, L BKA noted Skin: See below:   Data Reviewed: I have personally reviewed  following labs and imaging studies  CBC: Recent Labs  Lab 02/15/24 0108 02/16/24 0909 02/17/24 0637  WBC 9.5 6.7 6.4  NEUTROABS 5.8  --   --   HGB 10.5* 10.0* 10.2*  HCT 32.8* 31.0* 30.4*  MCV 94.3 92.8 90.7  PLT 152 138* 144*   Basic Metabolic Panel: Recent Labs  Lab 02/15/24 0108 02/16/24 0909 02/17/24 0637  NA 141 134* 138  K 4.1 3.9 3.9  CL 103 97* 99  CO2 32 29 31  GLUCOSE 158* 358* 221*  BUN 25* 19 16  CREATININE 0.87 0.76 0.68  CALCIUM  9.3 8.7* 9.2  MG 1.8 1.5*  --   PHOS  --  3.0  --    GFR: Estimated Creatinine Clearance: 84.5 mL/min (by C-G formula based on SCr of 0.68 mg/dL). Liver Function Tests: Recent Labs  Lab 02/15/24 0108 02/16/24 0909  AST 44* 29  ALT 23 19  ALKPHOS 91 71  BILITOT 0.4 0.4  PROT 6.6 6.1*  ALBUMIN 3.5 3.2*   No results for input(s): LIPASE, AMYLASE in the last 168 hours. No results for input(s): AMMONIA in the last 168 hours. Coagulation Profile: Recent Labs  Lab 02/15/24 0108  INR 1.0   Cardiac Enzymes: No results for input(s): CKTOTAL, CKMB, CKMBINDEX, TROPONINI in the last 168 hours. BNP (last 3 results) No results for input(s): PROBNP in the last 8760 hours. HbA1C: No results for input(s): HGBA1C in the last 72 hours. CBG: Recent Labs  Lab 02/16/24 0832 02/16/24 1156 02/16/24 1659 02/16/24 1954 02/17/24 0715  GLUCAP 330* 236* 299* 300* 216*   Lipid Profile: No results for input(s): CHOL, HDL, LDLCALC, TRIG, CHOLHDL, LDLDIRECT in the last 72 hours. Thyroid  Function Tests: No results for input(s): TSH, T4TOTAL, FREET4, T3FREE, THYROIDAB in the last 72 hours. Anemia Panel: No results for input(s): VITAMINB12, FOLATE, FERRITIN, TIBC, IRON, RETICCTPCT in the last 72 hours. Sepsis Labs: Recent Labs  Lab 02/15/24 0108  LATICACIDVEN 1.9    Recent Results (from the past 240 hours)  Blood Culture (routine x 2)     Status: None (Preliminary result)    Collection Time: 02/15/24  1:08 AM   Specimen: BLOOD  Result Value Ref Range Status   Specimen Description BLOOD BLOOD LEFT ARM  Final   Special Requests   Final    BOTTLES DRAWN AEROBIC AND ANAEROBIC Blood Culture adequate volume   Culture   Final    NO GROWTH 2 DAYS Performed at [facility], [address], [place], [place] [address]    Report Status PENDING  Incomplete  Blood Culture (routine x 2)     Status: None (Preliminary result)   Collection Time: 02/15/24  1:11 AM   Specimen: BLOOD  Result Value Ref Range Status   Specimen Description BLOOD BLOOD RIGHT ARM  Final   Special Requests AEROBIC BOTTLE ONLY Blood Culture adequate volume  Final   Culture   Final    NO GROWTH 2 DAYS Performed at [facility], [address], [place], [place] [address]    Report  Status PENDING  Incomplete   Radiology Studies: MR FOOT RIGHT W WO CONTRAST Result Date: 02/15/2024 CLINICAL DATA:  Heel wound. Concern for osteomyelitis. History of diabetes. EXAM: MRI OF THE RIGHT FOREFOOT WITHOUT AND WITH CONTRAST TECHNIQUE: Multiplanar, multisequence MR imaging of the right forefoot was performed before and after the administration of intravenous contrast. CONTRAST:  10mL GADAVIST  GADOBUTROL  1 MMOL/ML IV SOLN COMPARISON:  Right foot radiographs dated 02/15/2024. MRI of the right foot dated 11/10/2023. FINDINGS: Bones/Joint/Cartilage Soft tissue wound at the medial heel tracks deep to the level of the calcaneal tuberosity undersurface which demonstrates cortical indistinctness with T2 hyperintense marrow signal abnormality and corresponding T1 hypointensity and enhancement on postcontrast sequences, compatible with osteomyelitis. No fracture or dislocation. Mild-to-moderate degenerative arthropathy of the hindfoot and midfoot. No joint effusion. Ligaments Lateral and medial ankle ligaments are intact. Muscles and Tendons Flexor, peroneal and extensor compartment tendons are intact. Chronic thickening of the  central cord of the plantar fascia. Slightly increased thickening of the distal Achilles tendon may reflect tendinosis. Calcaneal enthesopathy at the insertion of the Achilles tendon and the origin of the central cord of the plantar fascia. Soft tissue Soft tissue wound at the medial heel with surrounding soft tissue edema, enhancement and cutaneous thickening, compatible with cellulitis. No loculated fluid collection. IMPRESSION: 1. Soft tissue wound at the medial heel extends deep to the underlying posterior calcaneal tuberosity with associated cortical distinctness and marrow signal abnormality compatible with osteomyelitis. Associated surrounding cellulitis. No loculated fluid collection. 2. Slightly increased thickening of the distal Achilles tendon may reflect tendinosis. 3. Chronic thickening of the central cord of the plantar fascia. Electronically Signed   By: Harrietta Sherry M.D.   On: 02/15/2024 12:02   Scheduled Meds:  busPIRone   5 mg Oral BID   clopidogrel   75 mg Oral Daily   enoxaparin  (LOVENOX ) injection  50 mg Subcutaneous Q24H   gabapentin   300 mg Oral QHS   insulin  aspart  0-15 Units Subcutaneous TID WC   insulin  aspart  0-5 Units Subcutaneous QHS   insulin  glargine  10 Units Subcutaneous QHS   isosorbide  mononitrate  30 mg Oral Daily   metoprolol  tartrate  12.5 mg Oral BID   pantoprazole   40 mg Oral Daily   rosuvastatin   40 mg Oral QHS   Continuous Infusions:  ampicillin -sulbactam (UNASYN ) IV 3 g (02/17/24 0511)   linezolid  (ZYVOX ) IV 600 mg (02/16/24 2033)     LOS: 2 days   Time spent:  Elsie JAYSON Montclair, DO [facility]  If 7PM-7AM, please contact night-coverage [email or web address]  02/17/2024, 8:18 AM      "

## 2024-02-17 NOTE — Anesthesia Procedure Notes (Signed)
 Procedure Name: Intubation Date/Time: 02/17/2024 1:45 PM  Performed by: Zelphia Norleen HERO, CRNAPre-anesthesia Checklist: Patient identified, Emergency Drugs available, Suction available and Patient being monitored Patient Re-evaluated:Patient Re-evaluated prior to induction Oxygen  Delivery Method: Circle system utilized Preoxygenation: Pre-oxygenation with 100% oxygen  Induction Type: IV induction Ventilation: Mask ventilation without difficulty Laryngoscope Size: Mac and 3 Grade View: Grade II Tube type: Oral Tube size: 7.0 mm Number of attempts: 1 Airway Equipment and Method: Stylet and Oral airway Placement Confirmation: ETT inserted through vocal cords under direct vision, positive ETCO2 and breath sounds checked- equal and bilateral Secured at: 22 cm Tube secured with: Tape Dental Injury: Teeth and Oropharynx as per pre-operative assessment

## 2024-02-17 NOTE — Plan of Care (Signed)
 " Problem: Education: Goal: Knowledge of General Education information will improve Description: Including pain rating scale, medication(s)/side effects and non-pharmacologic comfort measures 02/17/2024 2023 by Burnard Almarie BROCKS, RN Outcome: Progressing 02/17/2024 2023 by Burnard Almarie BROCKS, RN Outcome: Progressing   Problem: Health Behavior/Discharge Planning: Goal: Ability to manage health-related needs will improve 02/17/2024 2023 by Burnard Almarie BROCKS, RN Outcome: Progressing 02/17/2024 2023 by Burnard Almarie BROCKS, RN Outcome: Progressing   Problem: Clinical Measurements: Goal: Ability to maintain clinical measurements within normal limits will improve 02/17/2024 2023 by Burnard Almarie BROCKS, RN Outcome: Progressing 02/17/2024 2023 by Burnard Almarie BROCKS, RN Outcome: Progressing Goal: Will remain free from infection 02/17/2024 2023 by Burnard Almarie BROCKS, RN Outcome: Progressing 02/17/2024 2023 by Burnard Almarie BROCKS, RN Outcome: Progressing Goal: Diagnostic test results will improve 02/17/2024 2023 by Burnard Almarie BROCKS, RN Outcome: Progressing 02/17/2024 2023 by Burnard Almarie BROCKS, RN Outcome: Progressing Goal: Respiratory complications will improve 02/17/2024 2023 by Burnard Almarie BROCKS, RN Outcome: Progressing 02/17/2024 2023 by Burnard Almarie BROCKS, RN Outcome: Progressing Goal: Cardiovascular complication will be avoided 02/17/2024 2023 by Burnard Almarie BROCKS, RN Outcome: Progressing 02/17/2024 2023 by Burnard Almarie BROCKS, RN Outcome: Progressing   Problem: Activity: Goal: Risk for activity intolerance will decrease 02/17/2024 2023 by Burnard Almarie BROCKS, RN Outcome: Progressing 02/17/2024 2023 by Burnard Almarie BROCKS, RN Outcome: Progressing   Problem: Nutrition: Goal: Adequate nutrition will be maintained 02/17/2024 2023 by Burnard Almarie BROCKS, RN Outcome: Progressing 02/17/2024 2023 by Burnard Almarie BROCKS, RN Outcome: Progressing   Problem: Coping: Goal: Level  of anxiety will decrease 02/17/2024 2023 by Burnard Almarie BROCKS, RN Outcome: Progressing 02/17/2024 2023 by Burnard Almarie BROCKS, RN Outcome: Progressing   Problem: Elimination: Goal: Will not experience complications related to bowel motility 02/17/2024 2023 by Burnard Almarie BROCKS, RN Outcome: Progressing 02/17/2024 2023 by Burnard Almarie BROCKS, RN Outcome: Progressing Goal: Will not experience complications related to urinary retention 02/17/2024 2023 by Burnard Almarie BROCKS, RN Outcome: Progressing 02/17/2024 2023 by Burnard Almarie BROCKS, RN Outcome: Progressing   Problem: Pain Managment: Goal: General experience of comfort will improve and/or be controlled 02/17/2024 2023 by Burnard Almarie BROCKS, RN Outcome: Progressing 02/17/2024 2023 by Burnard Almarie BROCKS, RN Outcome: Progressing   Problem: Safety: Goal: Ability to remain free from injury will improve 02/17/2024 2023 by Burnard Almarie BROCKS, RN Outcome: Progressing 02/17/2024 2023 by Burnard Almarie BROCKS, RN Outcome: Progressing   Problem: Skin Integrity: Goal: Risk for impaired skin integrity will decrease 02/17/2024 2023 by Burnard Almarie BROCKS, RN Outcome: Progressing 02/17/2024 2023 by Burnard Almarie BROCKS, RN Outcome: Progressing   Problem: Education: Goal: Ability to describe self-care measures that may prevent or decrease complications (Diabetes Survival Skills Education) will improve 02/17/2024 2023 by Burnard Almarie BROCKS, RN Outcome: Progressing 02/17/2024 2023 by Burnard Almarie BROCKS, RN Outcome: Progressing Goal: Individualized Educational Video(s) 02/17/2024 2023 by Burnard Almarie BROCKS, RN Outcome: Progressing 02/17/2024 2023 by Burnard Almarie BROCKS, RN Outcome: Progressing   Problem: Coping: Goal: Ability to adjust to condition or change in health will improve 02/17/2024 2023 by Burnard Almarie BROCKS, RN Outcome: Progressing 02/17/2024 2023 by Burnard Almarie BROCKS, RN Outcome: Progressing   Problem: Fluid Volume: Goal:  Ability to maintain a balanced intake and output will improve 02/17/2024 2023 by Burnard Almarie BROCKS, RN Outcome: Progressing 02/17/2024 2023 by Burnard Almarie BROCKS, RN Outcome: Progressing   Problem: Health Behavior/Discharge Planning: Goal: Ability to identify and utilize available resources and services will improve 02/17/2024 2023 by Burnard Almarie BROCKS, RN Outcome: Progressing 02/17/2024 2023  by Burnard Almarie BROCKS, RN Outcome: Progressing Goal: Ability to manage health-related needs will improve 02/17/2024 2023 by Burnard Almarie BROCKS, RN Outcome: Progressing 02/17/2024 2023 by Burnard Almarie BROCKS, RN Outcome: Progressing   Problem: Metabolic: Goal: Ability to maintain appropriate glucose levels will improve 02/17/2024 2023 by Burnard Almarie BROCKS, RN Outcome: Progressing 02/17/2024 2023 by Burnard Almarie BROCKS, RN Outcome: Progressing   Problem: Nutritional: Goal: Maintenance of adequate nutrition will improve 02/17/2024 2023 by Burnard Almarie BROCKS, RN Outcome: Progressing 02/17/2024 2023 by Burnard Almarie BROCKS, RN Outcome: Progressing Goal: Progress toward achieving an optimal weight will improve 02/17/2024 2023 by Burnard Almarie BROCKS, RN Outcome: Progressing 02/17/2024 2023 by Burnard Almarie BROCKS, RN Outcome: Progressing   Problem: Skin Integrity: Goal: Risk for impaired skin integrity will decrease 02/17/2024 2023 by Burnard Almarie BROCKS, RN Outcome: Progressing 02/17/2024 2023 by Burnard Almarie BROCKS, RN Outcome: Progressing   Problem: Tissue Perfusion: Goal: Adequacy of tissue perfusion will improve 02/17/2024 2023 by Burnard Almarie BROCKS, RN Outcome: Progressing 02/17/2024 2023 by Burnard Almarie BROCKS, RN Outcome: Progressing   "

## 2024-02-17 NOTE — Plan of Care (Signed)

## 2024-02-17 NOTE — Anesthesia Preprocedure Evaluation (Addendum)
 "                                  Anesthesia Evaluation  Patient identified by MRN, date of birth, ID band Patient awake    Reviewed: Allergy & Precautions, H&P , NPO status , Patient's Chart, lab work & pertinent test results  Airway Mallampati: III  TM Distance: >3 FB Neck ROM: full    Dental  (+) Poor Dentition, Chipped, Missing,    Pulmonary shortness of breath and with exertion, sleep apnea , pneumonia, COPD, former smoker   Pulmonary exam normal breath sounds clear to auscultation       Cardiovascular Exercise Tolerance: Good hypertension, Pt. on medications and Pt. on home beta blockers + CAD, + Cardiac Stents, +CHF and + DOE  + dysrhythmias Atrial Fibrillation  Rhythm:regular Rate:Normal  Echo 11/2021  1. Left ventricular ejection fraction, by estimation, is 65 to 70%. The  left ventricle has hyperdynamic function. The left ventricle has no  regional wall motion abnormalities. Left ventricular diastolic parameters  are indeterminate.   2. Right ventricular systolic function was not well visualized. The right  ventricular size is not well visualized.   3. The mitral valve was not well visualized. No evidence of mitral valve  regurgitation. No evidence of mitral stenosis.   4. The aortic valve is calcified. Aortic valve regurgitation is not  visualized. No aortic stenosis is present.    Cath 07/2021   1st Diag lesion is 20% stenosed.   Prox LAD lesion is 20% stenosed.   Mid LAD-1 lesion is 20% stenosed.   Dist RCA lesion is 30% stenosed.   Prox RCA-2 lesion is 30% stenosed.   Previously placed Prox RCA-1 stent of unknown type is  widely patent.   Previously placed Mid LAD-2 stent of unknown type is  widely patent.   The left ventricular systolic function is normal.   LV end diastolic pressure is mildly elevated.   The left ventricular ejection fraction is 55-65% by visual estimate.   Mild nonobstructive CAD with 20% stenosis in a very  proximal diagonal (optional diagonal) vessel with 20% segmental proximal LAD stenosis with widely patent previously placed mid LAD stent.  The left circumflex coronary artery is angiographically normal.  The previously placed stent in the right coronary artery is widely patent and there is mild 30% mid and distal both stenoses.   Normal LV function with EF at 55%.  LVEDP 23 mmHg.    Neuro/Psych  PSYCHIATRIC DISORDERS Anxiety Depression    TIA   GI/Hepatic Neg liver ROS,GERD  Medicated,,  Endo/Other  diabetes, Type 2Hypothyroidism  Class 3 obesity  Renal/GU Renal disease  negative genitourinary   Musculoskeletal   Abdominal  (+) + obese  Peds  Hematology negative hematology ROS (+)   Anesthesia Other Findings   Reproductive/Obstetrics negative OB ROS                              Anesthesia Physical Anesthesia Plan  ASA: 4  Anesthesia Plan: General   Post-op Pain Management: Tylenol  PO (pre-op)*   Induction: Intravenous  PONV Risk Score and Plan: 3 and Ondansetron , Treatment may vary due to age or medical condition and Dexamethasone  Airway Management Planned: Oral ETT and Video Laryngoscope Planned  Additional Equipment: None  Intra-op Plan:   Post-operative Plan: Extubation in OR  Informed Consent:  I have reviewed the patients History and Physical, chart, labs and discussed the procedure including the risks, benefits and alternatives for the proposed anesthesia with the patient or authorized representative who has indicated his/her understanding and acceptance.     Dental advisory given  Plan Discussed with: CRNA  Anesthesia Plan Comments: (Chronic O2 3LNC)         Anesthesia Quick Evaluation  "

## 2024-02-17 NOTE — Interval H&P Note (Signed)
 History and Physical Interval Note:  02/17/2024 10:31 AM  Nicole [name] Roles  has presented today for surgery, with the diagnosis of osteo right calc.  The various methods of treatment have been discussed with the patient and family. After consideration of risks, benefits and other options for treatment, the patient has consented to  Procedures with comments: IRRIGATION AND DEBRIDEMENT WOUND (Right) - PARTIAL EXCISION HEEL WITH APPLICATION OF WOUND VAC as a surgical intervention.  The patient's history has been reviewed, patient examined, no change in status, stable for surgery.  I have reviewed the patient's chart and labs.  Questions were answered to the patient's satisfaction.     [name]

## 2024-02-17 NOTE — Op Note (Signed)
 "  OPERATIVE REPORT Patient name: Nicole [name] MRN: [id number] DOB: Jul 04, 1965  DOS: 02/17/2024  Preop Dx: Osteomyelitis calcaneus right Postop Dx: same  Procedure:  1.  Excisional debridement of ulcer right heel 2.  Partial calcanectomy right  3.  Implantation of antibiotic beads right 4.  Application of negative pressure wound VAC right   Surgeon: Thresa EMERSON Sar DPM  Anesthesia: General Anesthesia  Hemostasis: Calf tourniquet inflated to a pressure of after esmarch exsanguination   EBL: Minimal mL Materials: Stimulan OsteoSet bioabsorbable antibiotic beads 10cc Injectables: None Pathology: Soft tissue culture right heel wound.  Calcaneal bone right sent for culture  Condition: The patient tolerated the procedure and anesthesia well. No complications noted or reported   Justification for procedure: The patient is a 58 y.o. female PMHx T2DM, h/o BKA LLE who presents today for surgical correction of chronic heel wound with underlying osteomyelitis. The patient was told benefits as well as possible side effects of the surgery. The patient consented for surgical correction. The patient consent form was reviewed. All patient questions were answered. No guarantees were expressed or implied. The patient and the surgeon both signed the patient consent form with the witness present and placed in the patient's chart.   Procedure in Detail: The patient was brought to the operating room, placed in the operating table in a prone position at which time an aseptic scrub and drape were performed about the patient's respective lower extremity after anesthesia was induced as described above. Attention was then directed to the surgical area where procedure number one commenced.  Procedure #1: Excisional debridement of ulcer right heel Medically necessary excisional debridement including muscle and deep fascial tissue was performed to the right heel ulcer.  The overlying eschar penetrated  down to the level of bone.  All necrotic tissue within and around the wound was sharply debrided using a #15 scalpel.  After debridement of the wound which included muscle and deep fascial tissue that measures approximately 5.0 x 5.0 x 1.5 cm (LxWxD). This exposed the underlying calcaneal tuberosity.  A portion of the necrotic heel wound was placed in sterile specimen container and sent for tissue culture.  Attention was then directed to the osseous calcaneal tubercle where procedure #2 commenced  Procedure #2: Partial calcanectomy right Using a sagittal blade mounted on sagittal saw the plantar and posterior portions of the calcaneus were resected away.  Osseous cuts were made and the portion of bone was carefully removed including any additional nonviable tissue within the calcanectomy area.  The plantar portion of the calcaneus was placed in a sterile specimen container and sent to pathology for bone culture.  X-ray fluoroscopy was utilized to visualize the resected portions of the calcaneus.  The remaining portion of the calcaneus had a smooth anatomical contour with removal of the posterior and plantar heel spur noted preoperatively.  Copious irrigation was then utilized in preparation for procedure #3  Procedure #3: Application of antibiotic impregnated OsteoSet beads right 10 cc of OsteoSet beads were prepared using a combination of vancomycin  and tobramycin  powder.  The antibiotic beads were placed and embedded within the void created by excision and removal of the portions of the calcaneus.  Overlying Adaptic was then placed over the wound in preparation for application of the negative pressure wound VAC  Procedure #4: Application of negative pressure wound VAC right Negative pressure wound VAC was then applied in the standard manner to the heel wound.  Adaptic was left in place to  create a barrier between the wound and wound VAC granulofoam.  The wound VAC was turned on to ensure adequate suction  and no leaking.  Dry sterile compressive dressings were then applied to the patient's right lower extremity. The tourniquet which was used for hemostasis was deflated.   The patient was then transferred from the operating room to the recovery room having tolerated the procedure and anesthesia well. All vital signs are stable. After a brief stay in the recovery room the patient was readmitted to inpatient room with postoperative orders placed.  Thresa EMERSON Sar, DPM [facility]  Dr. Thresa EMERSON Sar, DPM    [address] [place], [place] [address]                Office [phone]  Fax [phone]   "

## 2024-02-17 NOTE — Transfer of Care (Signed)
 Immediate Anesthesia Transfer of Care Note  Patient: Nicole [name]  Procedure(s) Performed: IRRIGATION AND DEBRIDEMENT WOUND (Right)  Patient Location: PACU  Anesthesia Type:General  Level of Consciousness: awake and alert   Airway & Oxygen  Therapy: Patient Spontanous Breathing  Post-op Assessment: Report given to RN and Post -op Vital signs reviewed and stable  Post vital signs: Reviewed and stable  Last Vitals:  Vitals Value Taken Time  BP 139/68 02/17/24 15:00  Temp 36.7 C 02/17/24 14:57  Pulse 60 02/17/24 15:03  Resp 12 02/17/24 15:03  SpO2 93 % 02/17/24 15:03  Vitals shown include unfiled device data.  Last Pain:  Vitals:   02/17/24 1311  TempSrc:   PainSc: 6       Patients Stated Pain Goal: 4 (02/17/24 1311)  Complications: No notable events documented.

## 2024-02-17 NOTE — Progress Notes (Signed)
 Orthopedic Tech Progress Note Patient Details:  Nicole [name] 1965/12/09 [id number]  Ortho Devices Type of Ortho Device: CAM walker Ortho Device/Splint Location: RLE/left at bedside Ortho Device/Splint Interventions: Nicole [name] 02/17/2024, 3:34 PM

## 2024-02-17 NOTE — Inpatient Diabetes Management (Addendum)
 Inpatient Diabetes Program Recommendations  AACE/ADA: New Consensus Statement on Inpatient Glycemic Control (2015)  Target Ranges:  Prepandial:   less than 140 mg/dL      Peak postprandial:   less than 180 mg/dL (1-2 hours)      Critically ill patients:  140 - 180 mg/dL   Lab Results  Component Value Date   GLUCAP 195 (H) 02/17/2024   HGBA1C 13.2 (H) 11/10/2023    Review of Glycemic Control  Latest Reference Range & Units 02/16/24 16:59 02/16/24 19:54 02/17/24 07:15 02/17/24 11:23  Glucose-Capillary 70 - 99 mg/dL 700 (H) 699 (H) 783 (H) 195 (H)   Diabetes history: DM 2 Outpatient Diabetes medications:  U500 60 units tid with meals  Current orders for Inpatient glycemic control:  Novolog  0-15 units tid with meals and HS Lantus  10 units daily Inpatient Diabetes Program Recommendations:    Note that patient is on U500 insulin  at home.  Consider increasing Lantus  to 25 units daily and possibly adding Novolog  meal coverage 4 units tid with meals. Also consider adding A1C to labs.   Thanks,  Randall Bullocks, RN, BC-ADM Inpatient Diabetes Coordinator Pager [phone]  (8a-5p)

## 2024-02-18 DIAGNOSIS — S91301A Unspecified open wound, right foot, initial encounter: Secondary | ICD-10-CM | POA: Diagnosis not present

## 2024-02-18 LAB — GLUCOSE, CAPILLARY
Glucose-Capillary: 157 mg/dL — ABNORMAL HIGH (ref 70–99)
Glucose-Capillary: 221 mg/dL — ABNORMAL HIGH (ref 70–99)
Glucose-Capillary: 215 mg/dL — ABNORMAL HIGH (ref 70–99)
Glucose-Capillary: 258 mg/dL — ABNORMAL HIGH (ref 70–99)

## 2024-02-18 NOTE — Progress Notes (Signed)
" ° °  Subjective: 1 Day Post-Op Procedures (LRB): IRRIGATION AND DEBRIDEMENT WOUND (Right) DOS:   Objective: Vital signs in last 24 hours: Temp:  [97.8 F (36.6 C)-98.9 F (37.2 C)] 98.9 F (37.2 C) (12/27 1257) Pulse Rate:  [54-77] 73 (12/27 0752) Resp:  [16] 16 (12/27 1257) BP: (129-150)/(51-81) 129/81 (12/27 1257) SpO2:  [93 %-100 %] 100 % (12/27 1257)  Recent Labs    02/16/24 0909 02/17/24 0637  HGB 10.0* 10.2*   Recent Labs    02/16/24 0909 02/17/24 0637  WBC 6.7 6.4  RBC 3.34* 3.35*  HCT 31.0* 30.4*  PLT 138* 144*   Recent Labs    02/16/24 0909 02/17/24 0637  NA 134* 138  K 3.9 3.9  CL 97* 99  CO2 29 31  BUN 19 16  CREATININE 0.76 0.68  GLUCOSE 358* 221*  CALCIUM  8.7* 9.2   No results for input(s): LABPT, INR in the last 72 hours.  Aerobic/Anaerobic Culture w Gram Stain (surgical/deep wound) Order: [id number]  Status: Preliminary result     Next appt: 02/24/2024 at 01:30 PM in Cardiology ETTERLaymon [name] Qua, PA-C)   Test Result Released: No   Specimen Information: Bone; Tissue  0 Result Notes    Component Ref Range & Units (hover) 1 d ago  Specimen Description BONE  Special Requests RIGHT HEEL BONE  Gram Stain NO WBC SEEN NO ORGANISMS SEEN  Culture NO GROWTH < 24 HOURS Performed at [facility], [address] [place], [place] [address]  Report Status PENDING      Physical Exam: Wound vac left intact.  Sealed.  No complications  Assessment/Plan: 1 Day Post-Op Procedures (LRB): IRRIGATION AND DEBRIDEMENT WOUND (Right) DOS: 02/17/2024  -Seen at bedside this afternoon -will need to have arranged home health wound VAC dressing changes.  Order placed -Cultures pending.  Antibiotic management; oral vs. PICC once culture results are finalized.  -WBAT.  Minimal weightbearing postop shoe for transition purposes and bathroom purposes only -Will peripherally follow until discharge.  From a podiatry/surgical standpoint patient is okay  for discharge pending culture results and plan for antibiotics    Thresa [name] Sar 02/18/2024, 3:35 PM  Thresa EMERSON Sar, DPM [facility]  Dr. Thresa EMERSON Sar, DPM    [address] [place], [place] [address]                Office [phone]  Fax [phone]    "

## 2024-02-18 NOTE — Anesthesia Postprocedure Evaluation (Signed)
"   Anesthesia Post Note  Patient: Nicole [name]  Procedure(s) Performed: IRRIGATION AND DEBRIDEMENT WOUND (Right)     Patient location during evaluation: PACU Anesthesia Type: General Level of consciousness: sedated and patient cooperative Pain management: pain level controlled Vital Signs Assessment: post-procedure vital signs reviewed and stable Respiratory status: spontaneous breathing Cardiovascular status: stable Anesthetic complications: no   No notable events documented.  Last Vitals:  Vitals:   02/18/24 0512 02/18/24 0752  BP: (!) 150/54 (!) 134/57  Pulse: 61 73  Resp: 16 16  Temp: 36.7 C 36.9 C  SpO2: 97% 97%    Last Pain:  Vitals:   02/18/24 0824  TempSrc:   PainSc: 7                  Mikyah Alamo      "

## 2024-02-18 NOTE — Consult Note (Signed)
 WOC consulted for NPWT dressing changes 2x wk Placed on WOC nursing schedule for Mon/Thur beginning 02/20/24.   Nicole [name] [facility], CNS, CWON-AP [phone]

## 2024-02-18 NOTE — Progress Notes (Signed)
 " PROGRESS NOTE    Nicole [name]  FMW:[id number] DOB: [birth year] DOA: 02/14/2024 PCP: Shona Norleen PEDLAR, MD   Brief Narrative:  Patient is a 58 year old female with known history of hypertension hyperlipidemia GERD CAD diabetes type 2, peripheral arterial disease status post left BKA, COPD on room air sleep apnea noncompliant with CPAP and A-fib not currently on anticoagulation.  Patient reports to our facility with worsening right heel wound followed outpatient by Dr. Malvin where she has been having local wound debridement with no improvement.  Of note she was recently admitted to our facility on 9/17 to 9/23 for the same infection - at which time vascular surgery was consulted and recommended BKA, patient refused surgery at that time.  Assessment & Plan:   Principal Problem:   Non-healing open wound of right heel  Chronic diabetic foot ulcer with acute superimposed cellulitis, POA Peripheral artery disease, chronic, moderate to severe - Transition to Unasyn +linezolid  per discussion with pharmacy - Podiatry following: 12/26: debridement of right heel; partial calcaneal removal, implantation of antibiotic beads - Currently on wound vac - Pain currently well-controlled on oxycodone  5 mg every 4 hour - Wound care to follow - Continue Plavix , rosuvastatin   OSA on CPAP - Continue CPAP at night Tremor/diabetic neuropathy -chronic, continue home gabapentin .  Mood disorder, unspecified - Stable; Continue home buspirone  Essential hypertension - Continue metoprolol , Imdur  Mixed hyperlipidemia - Continue Crestor  History of CAD s/p DES to RCA in 2006 and another 2 stents in 2009  LHC in 2023 with nonobstructive CAD. Continue home Plavix , metoprolol , Imdur  and Crestor .   Uncontrolled type 2 diabetes mellitus with hyperglycemia -A1c 9.2(down from 13 a few months prior) - while improved is still markedly uncontrolled -Continue long-acting insulin , sliding scale as well as hypoglycemia  protocol.  GERD - Continue Protonix  Obesity class II (BMI 35.87) - Diet and lifestyle modification discussed and recommended    DVT prophylaxis: SCDs Start: 02/17/24 1555 SCDs Start: 02/15/24 0741 Code Status:   Code Status: Full Code Family Communication: None present  Status is: Inpatient  Dispo: The patient is from: Home              Anticipated d/c is to: To be determined              Anticipated d/c date is: And to be determined              Patient currently not medically stable for discharge  Consultants:  Podiatry  Procedures:  Pending above  Antimicrobials:  Unasyn , linezolid   Subjective: No acute issues or events overnight denies nausea vomiting diarrhea constipation headache fevers chills or chest pain - post operative pain well controlled on current regimen.  Objective: Vitals:   02/17/24 1552 02/17/24 2002 02/18/24 0512 02/18/24 0752  BP: 137/64 (!) 132/51 (!) 150/54 (!) 134/57  Pulse: (!) 54 77 61 73  Resp: 16 16 16 16   Temp: 97.9 F (36.6 C) 97.8 F (36.6 C) 98 F (36.7 C) 98.5 F (36.9 C)  TempSrc:    Oral  SpO2: 93% 95% 97% 97%  Weight:      Height:        Intake/Output Summary (Last 24 hours) at 02/18/2024 0758 Last data filed at 02/18/2024 0700 Gross per 24 hour  Intake 1273 ml  Output 2025 ml  Net -752 ml   Filed Weights   02/14/24 2224 02/17/24 1258  Weight: 94.8 kg 94.8 kg    Examination:  General:  Pleasantly resting in bed, No  acute distress. HEENT:  Normocephalic atraumatic.  Sclerae nonicteric, noninjected.  Extraocular movements intact bilaterally. Neck:  Without mass or deformity.  Trachea is midline. Lungs:  Clear to auscultate bilaterally without rhonchi, wheeze, or rales. Heart:  Regular rate and rhythm.  Without murmurs, rubs, or gallops. Abdomen:  Soft, nontender, nondistended.  Without guarding or rebound. Extremities: R foot wound vac intact    Data Reviewed: I have personally reviewed following labs and imaging  studies  CBC: Recent Labs  Lab 02/15/24 0108 02/16/24 0909 02/17/24 0637  WBC 9.5 6.7 6.4  NEUTROABS 5.8  --   --   HGB 10.5* 10.0* 10.2*  HCT 32.8* 31.0* 30.4*  MCV 94.3 92.8 90.7  PLT 152 138* 144*   Basic Metabolic Panel: Recent Labs  Lab 02/15/24 0108 02/16/24 0909 02/17/24 0637  NA 141 134* 138  K 4.1 3.9 3.9  CL 103 97* 99  CO2 32 29 31  GLUCOSE 158* 358* 221*  BUN 25* 19 16  CREATININE 0.87 0.76 0.68  CALCIUM  9.3 8.7* 9.2  MG 1.8 1.5*  --   PHOS  --  3.0  --    GFR: Estimated Creatinine Clearance: 83 mL/min (by C-G formula based on SCr of 0.68 mg/dL). Liver Function Tests: Recent Labs  Lab 02/15/24 0108 02/16/24 0909  AST 44* 29  ALT 23 19  ALKPHOS 91 71  BILITOT 0.4 0.4  PROT 6.6 6.1*  ALBUMIN 3.5 3.2*   Coagulation Profile: Recent Labs  Lab 02/15/24 0108  INR 1.0   HbA1C: Recent Labs    02/17/24 1834  HGBA1C 9.2*   CBG: Recent Labs  Lab 02/17/24 1301 02/17/24 1457 02/17/24 1808 02/17/24 2123 02/18/24 0624  GLUCAP 235* 161* 184* 263* 157*   Lipid Profile: No results for input(s): CHOL, HDL, LDLCALC, TRIG, CHOLHDL, LDLDIRECT in the last 72 hours. Thyroid  Function Tests: No results for input(s): TSH, T4TOTAL, FREET4, T3FREE, THYROIDAB in the last 72 hours. Anemia Panel: No results for input(s): VITAMINB12, FOLATE, FERRITIN, TIBC, IRON, RETICCTPCT in the last 72 hours. Sepsis Labs: Recent Labs  Lab 02/15/24 0108  LATICACIDVEN 1.9    Recent Results (from the past 240 hours)  Blood Culture (routine x 2)     Status: None (Preliminary result)   Collection Time: 02/15/24  1:08 AM   Specimen: BLOOD  Result Value Ref Range Status   Specimen Description BLOOD BLOOD LEFT ARM  Final   Special Requests   Final    BOTTLES DRAWN AEROBIC AND ANAEROBIC Blood Culture adequate volume   Culture   Final    NO GROWTH 2 DAYS Performed at [facility], [address] [place], [place] [address]    Report  Status PENDING  Incomplete  Blood Culture (routine x 2)     Status: None (Preliminary result)   Collection Time: 02/15/24  1:11 AM   Specimen: BLOOD  Result Value Ref Range Status   Specimen Description BLOOD BLOOD RIGHT ARM  Final   Special Requests AEROBIC BOTTLE ONLY Blood Culture adequate volume  Final   Culture   Final    NO GROWTH 2 DAYS Performed at [facility], [address] [place], [place] [address]    Report Status PENDING  Incomplete  Aerobic/Anaerobic Culture w Gram Stain (surgical/deep wound)     Status: None (Preliminary result)   Collection Time: 02/17/24  2:05 PM   Specimen: Soft Tissue, Other  Result Value Ref Range Status   Specimen Description TISSUE  Final   Special Requests  RIGHT HEEL TISSUE  Final   Gram Stain   Final    NO WBC SEEN RARE GRAM POSITIVE COCCI IN PAIRS Performed at [facility], [address] [place], [place] [address]    Culture PENDING  Incomplete   Report Status PENDING  Incomplete  Aerobic/Anaerobic Culture w Gram Stain (surgical/deep wound)     Status: None (Preliminary result)   Collection Time: 02/17/24  2:06 PM   Specimen: Bone; Tissue  Result Value Ref Range Status   Specimen Description BONE  Final   Special Requests RIGHT HEEL BONE  Final   Gram Stain   Final    NO WBC SEEN NO ORGANISMS SEEN Performed at [facility], [address], [place], [place] [address]    Culture PENDING  Incomplete   Report Status PENDING  Incomplete   Radiology Studies: DG Foot Complete Right Result Date: 02/17/2024 CLINICAL DATA:  Postop.  Nonhealing wound. EXAM: RIGHT FOOT COMPLETE - 3+ VIEW COMPARISON:  01/16/2024 FINDINGS: A wound VAC overlies the heel posterior and inferior to the calcaneus. Probable additional overlying dressing in place. The question cortical irregularity of the calcaneus is not well demonstrated on the current exam due to overlying artifact. Hammertoe deformity of the toes. No acute fracture. There is  generalized soft tissue edema. IMPRESSION: Wound VAC overlies the heel posterior and inferior to the calcaneus. The questioned cortical irregularity of the calcaneus on prior is not well demonstrated on the current exam due to overlying artifact. Electronically Signed   By: Andrea Gasman M.D.   On: 02/17/2024 18:48   DG MINI C-ARM IMAGE ONLY Result Date: 02/17/2024 There is no interpretation for this exam.  This order is for images obtained during a surgical procedure.  Please See Surgeries Tab for more information regarding the procedure.   Scheduled Meds:  busPIRone   5 mg Oral BID   clopidogrel   75 mg Oral Daily   enoxaparin  (LOVENOX ) injection  50 mg Subcutaneous Q24H   gabapentin   300 mg Oral QHS   insulin  aspart  0-15 Units Subcutaneous TID WC   insulin  aspart  0-5 Units Subcutaneous QHS   insulin  glargine  10 Units Subcutaneous QHS   isosorbide  mononitrate  30 mg Oral Daily   metoprolol  tartrate  12.5 mg Oral BID   pantoprazole   40 mg Oral Daily   rosuvastatin   40 mg Oral QHS   Continuous Infusions:  ampicillin -sulbactam (UNASYN ) IV 3 g (02/18/24 0502)   linezolid  (ZYVOX ) IV 600 mg (02/17/24 2117)     LOS: 3 days   Time spent:  Elsie JAYSON Montclair, DO [facility]  If 7PM-7AM, please contact night-coverage [email or web address]  02/18/2024, 7:58 AM      "

## 2024-02-19 DIAGNOSIS — S91301A Unspecified open wound, right foot, initial encounter: Secondary | ICD-10-CM | POA: Diagnosis not present

## 2024-02-19 LAB — BASIC METABOLIC PANEL WITH GFR
Anion gap: 9 (ref 5–15)
BUN: 16 mg/dL (ref 6–20)
CO2: 29 mmol/L (ref 22–32)
Calcium: 9.1 mg/dL (ref 8.9–10.3)
Chloride: 98 mmol/L (ref 98–111)
Creatinine, Ser: 0.8 mg/dL (ref 0.44–1.00)
GFR, Estimated: >60 mL/min
Glucose, Bld: 249 mg/dL — ABNORMAL HIGH (ref 70–99)
Potassium: 4.3 mmol/L (ref 3.5–5.1)
Sodium: 136 mmol/L (ref 135–145)

## 2024-02-19 LAB — GLUCOSE, CAPILLARY
Glucose-Capillary: 228 mg/dL — ABNORMAL HIGH (ref 70–99)
Glucose-Capillary: 252 mg/dL — ABNORMAL HIGH (ref 70–99)
Glucose-Capillary: 262 mg/dL — ABNORMAL HIGH (ref 70–99)
Glucose-Capillary: 235 mg/dL — ABNORMAL HIGH (ref 70–99)
Glucose-Capillary: 248 mg/dL — ABNORMAL HIGH (ref 70–99)

## 2024-02-19 LAB — CBC
HCT: 30.3 % — ABNORMAL LOW (ref 36.0–46.0)
Hemoglobin: 10 g/dL — ABNORMAL LOW (ref 12.0–15.0)
MCH: 29.9 pg (ref 26.0–34.0)
MCHC: 33 g/dL (ref 30.0–36.0)
MCV: 90.4 fL (ref 80.0–100.0)
Platelets: 150 K/uL (ref 150–400)
RBC: 3.35 MIL/uL — ABNORMAL LOW (ref 3.87–5.11)
RDW: 13.8 % (ref 11.5–15.5)
WBC: 8.3 K/uL (ref 4.0–10.5)
nRBC: 0 % (ref 0.0–0.2)

## 2024-02-19 NOTE — Progress Notes (Signed)
Placed patient on CPAP for the night via auto-mode.  

## 2024-02-19 NOTE — Progress Notes (Signed)
 " PROGRESS NOTE    Nicole [name]  FMW:[id number] DOB: 1965/05/28 DOA: 02/14/2024 PCP: Shona Norleen PEDLAR, MD   Brief Narrative:  Patient is a 58 year old female with known history of hypertension hyperlipidemia GERD CAD diabetes type 2, peripheral arterial disease status post left BKA, COPD on room air sleep apnea noncompliant with CPAP and A-fib not currently on anticoagulation.  Patient reports to our facility with worsening right heel wound followed outpatient by Dr. Malvin where she has been having local wound debridement with no improvement.  Of note she was recently admitted to our facility on 9/17 to 9/23 for the same infection - at which time vascular surgery was consulted and recommended BKA, patient refused surgery at that time.  Assessment & Plan:   Principal Problem:   Non-healing open wound of right heel   Chronic diabetic foot ulcer with acute superimposed cellulitis, POA Proteus positive cultures preliminary Peripheral artery disease, chronic, moderate to severe - Transition to Unasyn +linezolid  per discussion with pharmacy - Podiatry following: 12/26: debridement of right heel; partial calcaneal removal, implantation of antibiotic beads - Currently on wound vac - Pain currently well-controlled on oxycodone  5 mg every 4 hour - Wound care to follow - Continue Plavix , rosuvastatin   OSA on CPAP - Continue CPAP at night Tremor/diabetic neuropathy -chronic, continue home gabapentin .  Mood disorder, unspecified - Stable; Continue home buspirone  Essential hypertension - Continue metoprolol , Imdur  Mixed hyperlipidemia - Continue Crestor  History of CAD s/p DES to RCA in 2006 and another 2 stents in 2009  LHC in 2023 with nonobstructive CAD. Continue home Plavix , metoprolol , Imdur  and Crestor .   Uncontrolled type 2 diabetes mellitus with hyperglycemia -A1c 9.2(down from 13 a few months prior) - while improved is still markedly uncontrolled -Continue long-acting insulin ,  sliding scale as well as hypoglycemia protocol.  GERD - Continue Protonix  Obesity class II (BMI 35.87) - Diet and lifestyle modification discussed and recommended    DVT prophylaxis: SCDs Start: 02/17/24 1555 SCDs Start: 02/15/24 0741 Code Status:   Code Status: Full Code Family Communication: None present  Status is: Inpatient  Dispo: The patient is from: Home              Anticipated d/c is to: To be determined              Anticipated d/c date is: And to be determined              Patient currently not medically stable for discharge  Consultants:  Podiatry  Procedures:  Pending above  Antimicrobials:  Unasyn , linezolid   Subjective: No acute issues or events overnight denies nausea vomiting diarrhea constipation headache fevers chills or chest pain - post operative pain well controlled on current regimen.  Objective: Vitals:   02/18/24 1551 02/18/24 1955 02/18/24 2214 02/19/24 0502  BP: 135/71 (!) 148/51 (!) 148/51 (!) 146/68  Pulse: 74 78 78 63  Resp: 16 16  16   Temp: 98.7 F (37.1 C) 98.8 F (37.1 C)  98.4 F (36.9 C)  TempSrc: Oral Oral  Axillary  SpO2: 98% 92%  94%  Weight:      Height:        Intake/Output Summary (Last 24 hours) at 02/19/2024 0743 Last data filed at 02/19/2024 0503 Gross per 24 hour  Intake 480 ml  Output 3000 ml  Net -2520 ml   Filed Weights   02/14/24 2224 02/17/24 1258  Weight: 94.8 kg 94.8 kg    Examination:  General:  Pleasantly resting in bed,  No acute distress. HEENT:  Normocephalic atraumatic.  Sclerae nonicteric, noninjected.  Extraocular movements intact bilaterally. Neck:  Without mass or deformity.  Trachea is midline. Lungs:  Clear to auscultate bilaterally without rhonchi, wheeze, or rales. Heart:  Regular rate and rhythm.  Without murmurs, rubs, or gallops. Abdomen:  Soft, nontender, nondistended.  Without guarding or rebound. Extremities: R foot wound vac intact    Data Reviewed: I have personally reviewed  following labs and imaging studies  CBC: Recent Labs  Lab 02/15/24 0108 02/16/24 0909 02/17/24 0637 02/19/24 0335  WBC 9.5 6.7 6.4 8.3  NEUTROABS 5.8  --   --   --   HGB 10.5* 10.0* 10.2* 10.0*  HCT 32.8* 31.0* 30.4* 30.3*  MCV 94.3 92.8 90.7 90.4  PLT 152 138* 144* 150   Basic Metabolic Panel: Recent Labs  Lab 02/15/24 0108 02/16/24 0909 02/17/24 0637 02/19/24 0335  NA 141 134* 138 136  K 4.1 3.9 3.9 4.3  CL 103 97* 99 98  CO2 32 29 31 29   GLUCOSE 158* 358* 221* 249*  BUN 25* 19 16 16   CREATININE 0.87 0.76 0.68 0.80  CALCIUM  9.3 8.7* 9.2 9.1  MG 1.8 1.5*  --   --   PHOS  --  3.0  --   --    GFR: Estimated Creatinine Clearance: 83 mL/min (by C-G formula based on SCr of 0.8 mg/dL). Liver Function Tests: Recent Labs  Lab 02/15/24 0108 02/16/24 0909  AST 44* 29  ALT 23 19  ALKPHOS 91 71  BILITOT 0.4 0.4  PROT 6.6 6.1*  ALBUMIN 3.5 3.2*   Coagulation Profile: Recent Labs  Lab 02/15/24 0108  INR 1.0   HbA1C: Recent Labs    02/17/24 1834  HGBA1C 9.2*   CBG: Recent Labs  Lab 02/18/24 0624 02/18/24 1135 02/18/24 1658 02/18/24 2050 02/19/24 0556  GLUCAP 157* 221* 215* 258* 228*   Lipid Profile: No results for input(s): CHOL, HDL, LDLCALC, TRIG, CHOLHDL, LDLDIRECT in the last 72 hours. Thyroid  Function Tests: No results for input(s): TSH, T4TOTAL, FREET4, T3FREE, THYROIDAB in the last 72 hours. Anemia Panel: No results for input(s): VITAMINB12, FOLATE, FERRITIN, TIBC, IRON, RETICCTPCT in the last 72 hours. Sepsis Labs: Recent Labs  Lab 02/15/24 0108  LATICACIDVEN 1.9    Recent Results (from the past 240 hours)  Blood Culture (routine x 2)     Status: None (Preliminary result)   Collection Time: 02/15/24  1:08 AM   Specimen: BLOOD  Result Value Ref Range Status   Specimen Description BLOOD BLOOD LEFT ARM  Final   Special Requests   Final    BOTTLES DRAWN AEROBIC AND ANAEROBIC Blood Culture adequate volume    Culture   Final    NO GROWTH 2 DAYS Performed at [facility], [address] [place], [place] [address]    Report Status PENDING  Incomplete  Blood Culture (routine x 2)     Status: None (Preliminary result)   Collection Time: 02/15/24  1:11 AM   Specimen: BLOOD  Result Value Ref Range Status   Specimen Description BLOOD BLOOD RIGHT ARM  Final   Special Requests AEROBIC BOTTLE ONLY Blood Culture adequate volume  Final   Culture   Final    NO GROWTH 2 DAYS Performed at [facility], [address], [place], [place] [address]    Report Status PENDING  Incomplete  Aerobic/Anaerobic Culture w Gram Stain (surgical/deep wound)     Status: None (Preliminary result)   Collection Time:  02/17/24  2:05 PM   Specimen: Soft Tissue, Other  Result Value Ref Range Status   Specimen Description TISSUE  Final   Special Requests RIGHT HEEL TISSUE  Final   Gram Stain NO WBC SEEN RARE GRAM POSITIVE COCCI IN PAIRS   Final   Culture   Final    MODERATE PROTEUS MIRABILIS CULTURE REINCUBATED FOR BETTER GROWTH Performed at [facility], [address], [place], [place] [address]    Report Status PENDING  Incomplete  Aerobic/Anaerobic Culture w Gram Stain (surgical/deep wound)     Status: None (Preliminary result)   Collection Time: 02/17/24  2:06 PM   Specimen: Bone; Tissue  Result Value Ref Range Status   Specimen Description BONE  Final   Special Requests RIGHT HEEL BONE  Final   Gram Stain NO WBC SEEN NO ORGANISMS SEEN   Final   Culture   Final    NO GROWTH < 24 HOURS Performed at [facility], [address], [place], [place] [address]    Report Status PENDING  Incomplete   Radiology Studies: DG Foot Complete Right Result Date: 02/17/2024 CLINICAL DATA:  Postop.  Nonhealing wound. EXAM: RIGHT FOOT COMPLETE - 3+ VIEW COMPARISON:  01/16/2024 FINDINGS: A wound VAC overlies the heel posterior and inferior to the calcaneus. Probable additional overlying dressing in place. The  question cortical irregularity of the calcaneus is not well demonstrated on the current exam due to overlying artifact. Hammertoe deformity of the toes. No acute fracture. There is generalized soft tissue edema. IMPRESSION: Wound VAC overlies the heel posterior and inferior to the calcaneus. The questioned cortical irregularity of the calcaneus on prior is not well demonstrated on the current exam due to overlying artifact. Electronically Signed   By: Andrea Gasman M.D.   On: 02/17/2024 18:48   DG MINI C-ARM IMAGE ONLY Result Date: 02/17/2024 There is no interpretation for this exam.  This order is for images obtained during a surgical procedure.  Please See Surgeries Tab for more information regarding the procedure.   Scheduled Meds:  busPIRone   5 mg Oral BID   clopidogrel   75 mg Oral Daily   enoxaparin  (LOVENOX ) injection  50 mg Subcutaneous Q24H   gabapentin   300 mg Oral QHS   insulin  aspart  0-15 Units Subcutaneous TID WC   insulin  aspart  0-5 Units Subcutaneous QHS   insulin  glargine  10 Units Subcutaneous QHS   isosorbide  mononitrate  30 mg Oral Daily   metoprolol  tartrate  12.5 mg Oral BID   pantoprazole   40 mg Oral Daily   rosuvastatin   40 mg Oral QHS   Continuous Infusions:  ampicillin -sulbactam (UNASYN ) IV 3 g (02/19/24 0657)   linezolid  (ZYVOX ) IV 600 mg (02/18/24 2230)     LOS: 4 days   Time spent:  Elsie JAYSON Montclair, DO [facility]  If 7PM-7AM, please contact night-coverage [email or web address]  02/19/2024, 7:43 AM      "

## 2024-02-19 NOTE — Plan of Care (Signed)
   Problem: Education: Goal: Knowledge of General Education information will improve Description: Including pain rating scale, medication(s)/side effects and non-pharmacologic comfort measures Outcome: Progressing   Problem: Clinical Measurements: Goal: Will remain free from infection Outcome: Progressing   Problem: Activity: Goal: Risk for activity intolerance will decrease Outcome: Progressing   Problem: Nutrition: Goal: Adequate nutrition will be maintained Outcome: Progressing   Problem: Pain Managment: Goal: General experience of comfort will improve and/or be controlled Outcome: Progressing

## 2024-02-20 ENCOUNTER — Encounter (HOSPITAL_COMMUNITY): Payer: Self-pay | Admitting: Podiatry

## 2024-02-20 DIAGNOSIS — S91301A Unspecified open wound, right foot, initial encounter: Secondary | ICD-10-CM | POA: Diagnosis not present

## 2024-02-20 LAB — CULTURE, BLOOD (ROUTINE X 2)
Culture: NO GROWTH
Culture: NO GROWTH
Special Requests: ADEQUATE
Special Requests: ADEQUATE

## 2024-02-20 LAB — GLUCOSE, CAPILLARY
Glucose-Capillary: 341 mg/dL — ABNORMAL HIGH (ref 70–99)
Glucose-Capillary: 333 mg/dL — ABNORMAL HIGH (ref 70–99)
Glucose-Capillary: 210 mg/dL — ABNORMAL HIGH (ref 70–99)
Glucose-Capillary: 267 mg/dL — ABNORMAL HIGH (ref 70–99)

## 2024-02-20 MED ORDER — INSULIN ASPART 100 UNIT/ML IJ SOLN
0.0000 [IU] | Freq: Three times a day (TID) | INTRAMUSCULAR | Status: DC
  Administered 2024-02-20: 7 [IU] via SUBCUTANEOUS
  Administered 2024-02-21: 15 [IU] via SUBCUTANEOUS
  Administered 2024-02-21: 7 [IU] via SUBCUTANEOUS
  Administered 2024-02-21: 11 [IU] via SUBCUTANEOUS
  Administered 2024-02-22 (×3): 7 [IU] via SUBCUTANEOUS
  Administered 2024-02-23 (×2): 4 [IU] via SUBCUTANEOUS
  Administered 2024-02-23: 11 [IU] via SUBCUTANEOUS
  Filled 2024-02-20: qty 3
  Filled 2024-02-20: qty 7
  Filled 2024-02-20: qty 4
  Filled 2024-02-20: qty 7
  Filled 2024-02-20: qty 14
  Filled 2024-02-20: qty 7
  Filled 2024-02-20: qty 5
  Filled 2024-02-20: qty 2
  Filled 2024-02-20: qty 15
  Filled 2024-02-20: qty 11

## 2024-02-20 MED ORDER — INSULIN GLARGINE 100 UNIT/ML ~~LOC~~ SOLN
20.0000 [IU] | Freq: Every day | SUBCUTANEOUS | Status: DC
  Administered 2024-02-20: 20 [IU] via SUBCUTANEOUS
  Filled 2024-02-20 (×2): qty 0.2

## 2024-02-20 MED ORDER — PIPERACILLIN-TAZOBACTAM 3.375 G IVPB
3.3750 g | Freq: Three times a day (TID) | INTRAVENOUS | Status: DC
  Administered 2024-02-20 – 2024-02-24 (×12): 3.375 g via INTRAVENOUS
  Filled 2024-02-20 (×12): qty 50

## 2024-02-20 NOTE — Progress Notes (Signed)
 " PROGRESS NOTE    Nicole [name]  FMW:[id number] DOB: 1965/04/05 DOA: 02/14/2024 PCP: Shona Norleen PEDLAR, MD   Brief Narrative:  Patient is a 58 year old female with known history of hypertension hyperlipidemia GERD CAD diabetes type 2, peripheral arterial disease status post left BKA, COPD on room air sleep apnea noncompliant with CPAP and A-fib not currently on anticoagulation.  Patient reports to our facility with worsening right heel wound followed outpatient by Dr. Malvin where she has been having local wound debridement with no improvement.  Of note she was recently admitted to our facility on 9/17 to 9/23 for the same infection - at which time vascular surgery was consulted and recommended BKA, patient refused surgery at that time.  Assessment & Plan:   Principal Problem:   Non-healing open wound of right heel  Chronic diabetic foot ulcer with acute superimposed cellulitis, POA Polymicrobial wound cultures Proteus, Pseudomonas, staph Peripheral artery disease, chronic, moderate to severe - Transition to Zosyn  to cover Pseudomonas - Podiatry following: 12/26: debridement of right heel; partial calcaneal removal, implantation of antibiotic beads - Currently on wound vac -weightbearing as tolerated - Pain currently well-controlled on oxycodone  5 mg every 4 hour - Wound care to follow - Continue Plavix , rosuvastatin   OSA on CPAP - Continue CPAP at night Tremor/diabetic neuropathy -chronic, continue home gabapentin .  Mood disorder, unspecified - Stable; Continue home buspirone  Essential hypertension - Continue metoprolol , Imdur  Mixed hyperlipidemia - Continue Crestor  History of CAD s/p DES to RCA in 2006 and another 2 stents in 2009  LHC in 2023 with nonobstructive CAD. Continue home Plavix , metoprolol , Imdur  and Crestor .   Uncontrolled type 2 diabetes mellitus with hyperglycemia -A1c 9.2(down from 13 a few months prior) - while improved is still markedly uncontrolled -Continue  long-acting insulin , sliding scale as well as hypoglycemia protocol.  GERD - Continue Protonix  Obesity class II (BMI 35.87) - Diet and lifestyle modification discussed and recommended    DVT prophylaxis: SCDs Start: 02/17/24 1555 SCDs Start: 02/15/24 0741 Code Status:   Code Status: Full Code Family Communication: None present  Status is: Inpatient  Dispo: The patient is from: Home              Anticipated d/c is to: To be determined              Anticipated d/c date is: To be determined              Patient currently not medically stable for discharge  Consultants:  Podiatry  Procedures:  Pending above  Antimicrobials:  Unasyn , linezolid   Subjective: No acute issues or events overnight denies nausea vomiting diarrhea constipation headache fevers chills or chest pain - post operative pain well controlled on current regimen.  Objective: Vitals:   02/19/24 2242 02/19/24 2335 02/19/24 2341 02/20/24 0600  BP:  (!) 168/70 (!) 168/70 136/69  Pulse: 75 88 88 87  Resp: 18 18  16   Temp:  99.6 F (37.6 C)  98.6 F (37 C)  TempSrc:  Oral  Axillary  SpO2: 96% 91%  91%  Weight:      Height:        Intake/Output Summary (Last 24 hours) at 02/20/2024 0750 Last data filed at 02/20/2024 0648 Gross per 24 hour  Intake --  Output 1600 ml  Net -1600 ml   Filed Weights   02/14/24 2224 02/17/24 1258  Weight: 94.8 kg 94.8 kg    Examination:  General:  Pleasantly resting in bed, No acute distress.  HEENT:  Normocephalic atraumatic.  Sclerae nonicteric, noninjected.  Extraocular movements intact bilaterally. Neck:  Without mass or deformity.  Trachea is midline. Lungs:  Clear to auscultate bilaterally without rhonchi, wheeze, or rales. Heart:  Regular rate and rhythm.  Without murmurs, rubs, or gallops. Abdomen:  Soft, nontender, nondistended.  Without guarding or rebound. Extremities: R foot wound vac intact    Data Reviewed: I have personally reviewed following labs and  imaging studies  CBC: Recent Labs  Lab 02/15/24 0108 02/16/24 0909 02/17/24 0637 02/19/24 0335  WBC 9.5 6.7 6.4 8.3  NEUTROABS 5.8  --   --   --   HGB 10.5* 10.0* 10.2* 10.0*  HCT 32.8* 31.0* 30.4* 30.3*  MCV 94.3 92.8 90.7 90.4  PLT 152 138* 144* 150   Basic Metabolic Panel: Recent Labs  Lab 02/15/24 0108 02/16/24 0909 02/17/24 0637 02/19/24 0335  NA 141 134* 138 136  K 4.1 3.9 3.9 4.3  CL 103 97* 99 98  CO2 32 29 31 29   GLUCOSE 158* 358* 221* 249*  BUN 25* 19 16 16   CREATININE 0.87 0.76 0.68 0.80  CALCIUM  9.3 8.7* 9.2 9.1  MG 1.8 1.5*  --   --   PHOS  --  3.0  --   --    GFR: Estimated Creatinine Clearance: 83 mL/min (by C-G formula based on SCr of 0.8 mg/dL). Liver Function Tests: Recent Labs  Lab 02/15/24 0108 02/16/24 0909  AST 44* 29  ALT 23 19  ALKPHOS 91 71  BILITOT 0.4 0.4  PROT 6.6 6.1*  ALBUMIN 3.5 3.2*   Coagulation Profile: Recent Labs  Lab 02/15/24 0108  INR 1.0   HbA1C: Recent Labs    02/17/24 1834  HGBA1C 9.2*   CBG: Recent Labs  Lab 02/19/24 0556 02/19/24 0745 02/19/24 1231 02/19/24 1813 02/19/24 2313  GLUCAP 228* 252* 262* 235* 248*   Lipid Profile: No results for input(s): CHOL, HDL, LDLCALC, TRIG, CHOLHDL, LDLDIRECT in the last 72 hours. Thyroid  Function Tests: No results for input(s): TSH, T4TOTAL, FREET4, T3FREE, THYROIDAB in the last 72 hours. Anemia Panel: No results for input(s): VITAMINB12, FOLATE, FERRITIN, TIBC, IRON, RETICCTPCT in the last 72 hours. Sepsis Labs: Recent Labs  Lab 02/15/24 0108  LATICACIDVEN 1.9    Recent Results (from the past 240 hours)  Blood Culture (routine x 2)     Status: None   Collection Time: 02/15/24  1:08 AM   Specimen: BLOOD  Result Value Ref Range Status   Specimen Description BLOOD BLOOD LEFT ARM  Final   Special Requests   Final    BOTTLES DRAWN AEROBIC AND ANAEROBIC Blood Culture adequate volume   Culture   Final    NO GROWTH 5  DAYS Performed at [facility], [address] [place], [place] [address]    Report Status 02/20/2024 FINAL  Final  Blood Culture (routine x 2)     Status: None   Collection Time: 02/15/24  1:11 AM   Specimen: BLOOD  Result Value Ref Range Status   Specimen Description BLOOD BLOOD RIGHT ARM  Final   Special Requests AEROBIC BOTTLE ONLY Blood Culture adequate volume  Final   Culture   Final    NO GROWTH 5 DAYS Performed at [facility], [address], [place], [place] [address]    Report Status 02/20/2024 FINAL  Final  Aerobic/Anaerobic Culture w Gram Stain (surgical/deep wound)     Status: None (Preliminary result)   Collection Time: 02/17/24  2:05 PM  Specimen: Soft Tissue, Other  Result Value Ref Range Status   Specimen Description TISSUE  Final   Special Requests RIGHT HEEL TISSUE  Final   Gram Stain NO WBC SEEN RARE GRAM POSITIVE COCCI IN PAIRS   Final   Culture   Final    MODERATE PROTEUS MIRABILIS SUSCEPTIBILITIES TO FOLLOW HOLDING FOR POSSIBLE ANAEROBE CULTURE REINCUBATED FOR BETTER GROWTH Performed at [facility], [address] [place], [place] [address]    Report Status PENDING  Incomplete  Aerobic/Anaerobic Culture w Gram Stain (surgical/deep wound)     Status: None (Preliminary result)   Collection Time: 02/17/24  2:06 PM   Specimen: Bone; Tissue  Result Value Ref Range Status   Specimen Description BONE  Final   Special Requests RIGHT HEEL BONE  Final   Gram Stain   Final    NO WBC SEEN NO ORGANISMS SEEN Performed at [facility], [address], [place], [place] [address]    Culture   Final    CULTURE REINCUBATED FOR BETTER GROWTH NO ANAEROBES ISOLATED; CULTURE IN PROGRESS FOR 5 DAYS    Report Status PENDING  Incomplete   Radiology Studies: No results found.  Scheduled Meds:  busPIRone   5 mg Oral BID   clopidogrel   75 mg Oral Daily   enoxaparin  (LOVENOX ) injection  50 mg Subcutaneous Q24H   gabapentin   300 mg Oral QHS   insulin   aspart  0-15 Units Subcutaneous TID WC   insulin  aspart  0-5 Units Subcutaneous QHS   insulin  glargine  10 Units Subcutaneous QHS   isosorbide  mononitrate  30 mg Oral Daily   metoprolol  tartrate  12.5 mg Oral BID   pantoprazole   40 mg Oral Daily   rosuvastatin   40 mg Oral QHS   Continuous Infusions:  ampicillin -sulbactam (UNASYN ) IV 3 g (02/20/24 0648)   linezolid  (ZYVOX ) IV 600 mg (02/19/24 2358)     LOS: 5 days   Time spent:  Nicole JAYSON [place], DO [facility]  If 7PM-7AM, please contact night-coverage [email or web address]  02/20/2024, 7:50 AM      "

## 2024-02-20 NOTE — Inpatient Diabetes Management (Signed)
 Inpatient Diabetes Program Recommendations  AACE/ADA: New Consensus Statement on Inpatient Glycemic Control (2015)  Target Ranges:  Prepandial:   less than 140 mg/dL      Peak postprandial:   less than 180 mg/dL (1-2 hours)      Critically ill patients:  140 - 180 mg/dL   Lab Results  Component Value Date   GLUCAP 333 (H) 02/20/2024   HGBA1C 9.2 (H) 02/17/2024    Review of Glycemic Control  Latest Reference Range & Units 02/19/24 18:13 02/19/24 23:13 02/20/24 08:07 02/20/24 11:20  Glucose-Capillary 70 - 99 mg/dL 764 (H) 751 (H) 658 (H) 333 (H)   Diabetes history: DM 2 Outpatient Diabetes medications: U500 60 units tid with meals Current orders for Inpatient glycemic control:  Novolog  0-15 units tid with meals and HS Lantus  10 units daily  Inpatient Diabetes Program Recommendations:    Note that patient is on U500 insulin  at home.  Consider increasing Lantus  to 30 units daily and possibly adding Novolog  meal coverage 5 units tid with meals.   Thanks,  Randall Bullocks, RN, BC-ADM Inpatient Diabetes Coordinator Pager [phone]  (8a-5p)

## 2024-02-20 NOTE — Care Management Important Message (Signed)
 Important Message  Patient Details  Name: Nicole [name] MRN: [id number] Date of Birth: 1966/01/05   Important Message Given:  Yes - Medicare IM     Claretta Deed 02/20/2024, 2:58 PM

## 2024-02-20 NOTE — Plan of Care (Signed)
   Problem: Education: Goal: Knowledge of General Education information will improve Description: Including pain rating scale, medication(s)/side effects and non-pharmacologic comfort measures Outcome: Progressing   Problem: Clinical Measurements: Goal: Ability to maintain clinical measurements within normal limits will improve Outcome: Progressing Goal: Diagnostic test results will improve Outcome: Progressing

## 2024-02-20 NOTE — Consult Note (Signed)
 WOC Nurse Consult Note: Reason for Consult: NPWT right heel Wound type: surgical- s/p debridement 12/26; osteomyelitis  Pressure Injury POA: Yes Measurement:3.5cm x 5.0cm x 3.0cm Wound azi:bzoont Drainage (amount, consistency, odor) milky yellow/red Periwound:severely macerated; see photo Dressing procedure/placement/frequency: Removed old NPWT dressing (foam and adaptic) Lined wound edges with silver hydrofiber to assist in moisture management  Filled wound with  __1__ piece of black foam in deepest point, covered remainder of the wound bed with Mepitel one and 1pc of black foam  Sealed NPWT dressing at HG Patient received PO pain medication per bedside nurse prior to dressing change Patient tolerated procedure well WOC nurse will continue to provide NPWT dressing changed due to the complexity of the dressing change.   [place] to Dr. Janit about periwound maceration.   Arbie Reisz [facility], CNS, CWON-AP [phone]

## 2024-02-20 NOTE — Plan of Care (Signed)
" °  Problem: Education: Goal: Knowledge of General Education information will improve Description: Including pain rating scale, medication(s)/side effects and non-pharmacologic comfort measures Outcome: Progressing   Problem: Health Behavior/Discharge Planning: Goal: Ability to manage health-related needs will improve Outcome: Progressing   Problem: Clinical Measurements: Goal: Will remain free from infection Outcome: Progressing   Problem: Activity: Goal: Risk for activity intolerance will decrease Outcome: Not Progressing   Problem: Nutrition: Goal: Adequate nutrition will be maintained Outcome: Adequate for Discharge   Problem: Coping: Goal: Level of anxiety will decrease Outcome: Progressing   Problem: Elimination: Goal: Will not experience complications related to bowel motility Outcome: Adequate for Discharge   Problem: Elimination: Goal: Will not experience complications related to urinary retention Outcome: Adequate for Discharge   Problem: Pain Managment: Goal: General experience of comfort will improve and/or be controlled Outcome: Progressing   "

## 2024-02-21 DIAGNOSIS — S91301A Unspecified open wound, right foot, initial encounter: Secondary | ICD-10-CM | POA: Diagnosis not present

## 2024-02-21 LAB — GLUCOSE, CAPILLARY
Glucose-Capillary: 201 mg/dL — ABNORMAL HIGH (ref 70–99)
Glucose-Capillary: 232 mg/dL — ABNORMAL HIGH (ref 70–99)
Glucose-Capillary: 311 mg/dL — ABNORMAL HIGH (ref 70–99)
Glucose-Capillary: 255 mg/dL — ABNORMAL HIGH (ref 70–99)
Glucose-Capillary: 198 mg/dL — ABNORMAL HIGH (ref 70–99)

## 2024-02-21 LAB — BASIC METABOLIC PANEL WITH GFR
Anion gap: 9 (ref 5–15)
BUN: 20 mg/dL (ref 6–20)
CO2: 29 mmol/L (ref 22–32)
Calcium: 9.2 mg/dL (ref 8.9–10.3)
Chloride: 99 mmol/L (ref 98–111)
Creatinine, Ser: 0.87 mg/dL (ref 0.44–1.00)
GFR, Estimated: >60 mL/min
Glucose, Bld: 261 mg/dL — ABNORMAL HIGH (ref 70–99)
Potassium: 4.2 mmol/L (ref 3.5–5.1)
Sodium: 137 mmol/L (ref 135–145)

## 2024-02-21 LAB — CBC
HCT: 29.9 % — ABNORMAL LOW (ref 36.0–46.0)
Hemoglobin: 9.6 g/dL — ABNORMAL LOW (ref 12.0–15.0)
MCH: 29.6 pg (ref 26.0–34.0)
MCHC: 32.1 g/dL (ref 30.0–36.0)
MCV: 92.3 fL (ref 80.0–100.0)
Platelets: 138 K/uL — ABNORMAL LOW (ref 150–400)
RBC: 3.24 MIL/uL — ABNORMAL LOW (ref 3.87–5.11)
RDW: 13.8 % (ref 11.5–15.5)
WBC: 7.8 K/uL (ref 4.0–10.5)
nRBC: 0 % (ref 0.0–0.2)

## 2024-02-21 LAB — AEROBIC/ANAEROBIC CULTURE W GRAM STAIN (SURGICAL/DEEP WOUND): Gram Stain: NONE SEEN

## 2024-02-21 MED ORDER — INSULIN GLARGINE 100 UNIT/ML ~~LOC~~ SOLN
35.0000 [IU] | Freq: Every day | SUBCUTANEOUS | Status: DC
  Administered 2024-02-21: 35 [IU] via SUBCUTANEOUS
  Filled 2024-02-21 (×2): qty 0.35

## 2024-02-21 MED ORDER — TORSEMIDE 20 MG PO TABS
40.0000 mg | ORAL_TABLET | Freq: Every day | ORAL | Status: DC
  Administered 2024-02-21 – 2024-02-24 (×4): 40 mg via ORAL
  Filled 2024-02-21 (×4): qty 2

## 2024-02-21 NOTE — Plan of Care (Signed)
 Encouraged and assisted patient to turn q2h to prevent skin breakdown. 2BMs this shift.   Problem: Education: Goal: Knowledge of General Education information will improve Description: Including pain rating scale, medication(s)/side effects and non-pharmacologic comfort measures Outcome: Progressing   Problem: Clinical Measurements: Goal: Will remain free from infection Outcome: Progressing   Problem: Activity: Goal: Risk for activity intolerance will decrease Outcome: Progressing   Problem: Nutrition: Goal: Adequate nutrition will be maintained Outcome: Progressing   Problem: Safety: Goal: Ability to remain free from injury will improve Outcome: Progressing   Problem: Skin Integrity: Goal: Risk for impaired skin integrity will decrease Outcome: Progressing

## 2024-02-21 NOTE — Progress Notes (Signed)
" °   02/21/24 2354  BiPAP/CPAP/SIPAP  BiPAP/CPAP/SIPAP Pt Type Adult  BiPAP/CPAP/SIPAP Resmed  Mask Type Full face mask  Dentures removed? Not applicable  Mask Size Medium  Respiratory Rate 18 breaths/min  Flow Rate 2 lpm  Patient Home Machine No  Patient Home Mask No  Patient Home Tubing No  Auto Titrate Yes  Minimum cmH2O 4 cmH2O  Maximum cmH2O 10 cmH2O  CPAP/SIPAP surface wiped down Yes  Device Plugged into RED Power Outlet Yes  BiPAP/CPAP /SiPAP Vitals  Resp 18  Bilateral Breath Sounds Diminished  MEWS Score/Color  MEWS Score 0  MEWS Score Color Green    "

## 2024-02-21 NOTE — Evaluation (Signed)
 Physical Therapy Evaluation Patient Details Name: Nicole [name] MRN: [id number] DOB: [birth year] Today's Date: 02/21/2024  History of Present Illness  58y.o. female admitted on 02/14/24 secondary to non-healing wound on R heel. Underwent debridement of R heel 12/26 with partial calcaneal removal, currently with wound vac and WBAT RLE in CAM boot. PMH: GERD, HTN, hyperlipidemia, CAD, T2DM, PAD, L BKA, COPD, and afib.  Clinical Impression  PTA, pt was living with her daughters in a single story house with ramped entrance. She has been a primary WC user since September, IND with transfers to [facility], however requires assistance with bathing, dressing, and mobility in the home and community distances from daughters. Pt reports she doesn't really leave her bedroom. She currently requires MOD assist for bed mobility and transfers. DEP to don R CAM boot and MIN A to done L prosthetic. Pt is limited by pain, weight bearing restrictions with new CAM boot, decreased strength and functional activity tolerance. Pt appears close to her functional baseline, however would benefit from continued skilled therapy intervention following discharge with HHPT and continued support from daughters. PT will continue to follow while she is admitted to acute.       If plan is discharge home, recommend the following: A little help with walking and/or transfers;A little help with bathing/dressing/bathroom;Assist for transportation;Help with stairs or ramp for entrance;Assistance with cooking/housework   Can travel by private vehicle        Equipment Recommendations None recommended by PT  Recommendations for Other Services       Functional Status Assessment Patient has had a recent decline in their functional status and demonstrates the ability to make significant improvements in function in a reasonable and predictable amount of time.     Precautions / Restrictions Precautions Precautions: None Recall of  Precautions/Restrictions: Intact Restrictions Weight Bearing Restrictions Per Provider Order: Yes RLE Weight Bearing Per Provider Order: Weight bearing as tolerated Other Position/Activity Restrictions: weight bearing in CAM boot for transfers only, minimal weight through foot      Mobility  Bed Mobility Overal bed mobility: Needs Assistance Bed Mobility: Supine to Sit     Supine to sit: HOB elevated, Used rails, Mod assist     General bed mobility comments: assistance to elevate trunk from bed surface, VC for sequencing and movement of BLE    Transfers Overall transfer level: Needs assistance Equipment used: Rolling walker (2 wheels) Transfers: Sit to/from Stand, Bed to chair/wheelchair/BSC Sit to Stand: Mod assist Stand pivot transfers: Mod assist         General transfer comment: MOD A with RW for x4 sit<>stands from EOB, recliner and BSC with VC for proper hand placement; MOD A for stand pivot transfer to assist with balance, and provide VC to back up enough before sitting and use of hands to aid in lowering to chair    Ambulation/Gait Ambulation/Gait assistance: Mod assist Gait Distance (Feet): 5 Feet Assistive device: Rolling walker (2 wheels) Gait Pattern/deviations: Step-to pattern, Decreased step length - right, Decreased step length - left, Decreased stance time - right, Decreased stance time - left Gait velocity: decreased     General Gait Details: completed with CAM boot on as patient only approved for minimal weight bearing for transfers with CAM boot on; transfers bed>recliner>BSC>recliner, benefits from cues for sequencing of movements between RW and feet, reports some increase in pain with R weightbearing  Stairs            Wheelchair Mobility  Tilt Bed    Modified Rankin (Stroke Patients Only)       Balance Overall balance assessment: Needs assistance Sitting-balance support: Feet supported, Bilateral upper extremity supported Sitting  balance-Leahy Scale: Fair Sitting balance - Comments: reports some dizziness with initial sititng, resolves with time   Standing balance support: During functional activity, Reliant on assistive device for balance Standing balance-Leahy Scale: Poor Standing balance comment: heavy support on RW, tendency to lean back requiring cues to improve posture and anterior weight shift for balance                             Pertinent Vitals/Pain Pain Assessment Pain Assessment: 0-10 Pain Score: 7  Pain Location: R foot Pain Descriptors / Indicators: Aching, Sharp, Tingling, Grimacing, Discomfort Pain Intervention(s): Limited activity within patient's tolerance, Monitored during session, Repositioned, Patient requesting pain meds-RN notified    Home Living Family/patient expects to be discharged to:: Private residence Living Arrangements: Children Available Help at Discharge: Family Type of Home: House Home Access: Ramped entrance       Home Layout: One level Home Equipment: Rollator (4 wheels);Rolling Walker (2 wheels);BSC/3in1;Wheelchair - manual Additional Comments: lives with daughters, oldest works out of the house, youngest is home with her to assist as needed. Pt reports she has been a primary WC user since September due to nonhealing wound, and able to complete stand pivot or squat pivot transfer to her WC independently. WC does not fit in bathroom    Prior Function Prior Level of Function : Needs assist       Physical Assist : ADLs (physical)   ADLs (physical): IADLs;Bathing Mobility Comments: Pt used prosthesis left BKA and was able to use walker and walk short distances at home. ADLs Comments: I B/D     Extremity/Trunk Assessment   Upper Extremity Assessment Upper Extremity Assessment: Overall WFL for tasks assessed    Lower Extremity Assessment Lower Extremity Assessment: Generalized weakness;RLE deficits/detail;LLE deficits/detail RLE Deficits / Details:  decreased functional strength at knee grossly 3+/5, absent touch to all toes; unable to asses ankle ROM and MMT secondary to bandages and pain RLE Sensation: decreased light touch LLE Deficits / Details: ROM and MMT WFL    Cervical / Trunk Assessment Cervical / Trunk Assessment: Kyphotic  Communication   Communication Communication: No apparent difficulties    Cognition Arousal: Alert Behavior During Therapy: WFL for tasks assessed/performed   PT - Cognitive impairments: No apparent impairments                       PT - Cognition Comments: flat affect, delayed responses Following commands: Intact       Cueing Cueing Techniques: Verbal cues, Tactile cues     General Comments General comments (skin integrity, edema, etc.): DEP to don CAM boot, MIN A to don L prosthetic; L prosthetic with poor fit, large gapping on all sides around thigh and knee with pt reporting pain at distal residual limb but hasn't been able to visit prosthetist for refit of device    Exercises     Assessment/Plan    PT Assessment Patient needs continued PT services  PT Problem List Decreased strength;Decreased range of motion;Decreased activity tolerance;Decreased balance;Decreased mobility;Pain;Impaired sensation       PT Treatment Interventions Gait training;Functional mobility training;Therapeutic activities;Therapeutic exercise;Balance training;Patient/family education    PT Goals (Current goals can be found in the Care Plan section)  Acute Rehab PT Goals  Patient Stated Goal: get back home and move well PT Goal Formulation: With patient Time For Goal Achievement: 02/28/24 Potential to Achieve Goals: Good    Frequency Min 2X/week     Co-evaluation               AM-PAC PT 6 Clicks Mobility  Outcome Measure Help needed turning from your back to your side while in a flat bed without using bedrails?: A Lot Help needed moving from lying on your back to sitting on the side of  a flat bed without using bedrails?: A Lot Help needed moving to and from a bed to a chair (including a wheelchair)?: A Lot Help needed standing up from a chair using your arms (e.g., wheelchair or bedside chair)?: A Lot Help needed to walk in hospital room?: Total Help needed climbing 3-5 steps with a railing? : Total 6 Click Score: 10    End of Session Equipment Utilized During Treatment: Gait belt Activity Tolerance: Patient tolerated treatment well;No increased pain;Other (comment) (anxiety over current situation) Patient left: in chair;with call bell/phone within reach Nurse Communication: Mobility status;Other (comment);Patient requests pain meds (incontinent BM) PT Visit Diagnosis: Pain;Unsteadiness on feet (R26.81);Muscle weakness (generalized) (M62.81) Pain - Right/Left: Right Pain - part of body: Ankle and joints of foot    Time: 0926-1023 PT Time Calculation (min) (ACUTE ONLY): 57 min   Charges:   PT Evaluation $PT Eval Low Complexity: 1 Low PT Treatments $Therapeutic Activity: 38-52 mins           Erasto Sleight H. Asael Pann, PT, DPT   [employer] 02/21/2024, 11:23 AM

## 2024-02-21 NOTE — TOC Progression Note (Signed)
 Transition of Care [facility]) - Progression Note    Patient Details  Name: Nicole [name] MRN: [id number] Date of Birth: 08-30-1965  [facility] CM/SW Contact  Tom-Johnson, Harvest Muskrat, RN Phone Number: 02/21/2024, 1:54 PM  Clinical Narrative:     Patient underwent Excisional Debridement of Ulcer to Rt Heel, partial Rt Calcanectomy and application of Negative Pressure Wound VAC to Rt Heel on 02/17/24 by Ortho. WOC following.   CM spoke with patient at bedside and daughter, [name] via phone about home health recommendations. Patient states she was active with [facility] in October and would like to use their services again. Referral sent via hub and CM spoke with [facility] with acceptance voiced, info on AVS.  CM called Wound Vac order to [place] with [facility] [phone]).   CM will continue to follow as patient progresses with care towards discharge.                    Expected Discharge Plan and Services                                               Social Drivers of Health (SDOH) Interventions SDOH Screenings   Food Insecurity: No Food Insecurity (02/15/2024)  Housing: Low Risk (02/15/2024)  Transportation Needs: No Transportation Needs (02/15/2024)  Utilities: Not At Risk (02/15/2024)  Tobacco Use: Medium Risk (02/17/2024)    Readmission Risk Interventions    11/15/2023   11:27 AM  Readmission Risk Prevention Plan  Transportation Screening Complete  PCP or Specialist Appt within 5-7 Days Complete  Home Care Screening Complete  Medication Review (RN CM) Complete

## 2024-02-21 NOTE — Plan of Care (Signed)

## 2024-02-21 NOTE — Inpatient Diabetes Management (Signed)
 Inpatient Diabetes Program Recommendations  AACE/ADA: New Consensus Statement on Inpatient Glycemic Control (2015)  Target Ranges:  Prepandial:   less than 140 mg/dL      Peak postprandial:   less than 180 mg/dL (1-2 hours)      Critically ill patients:  140 - 180 mg/dL   Lab Results  Component Value Date   GLUCAP 201 (H) 02/21/2024   HGBA1C 9.2 (H) 02/17/2024    Review of Glycemic Control  Latest Reference Range & Units 02/20/24 08:07 02/20/24 11:20 02/20/24 16:51 02/20/24 21:04 02/21/24 06:34 02/21/24 07:20  Glucose-Capillary 70 - 99 mg/dL 658 (H) 666 (H) 789 (H) 267 (H) 232 (H) 201 (H)  (H): Data is abnormally high  Diabetes history: DM 2 Outpatient Diabetes medications: U500 60 units tid with meals Current orders for Inpatient glycemic control:  Novolog  0-15 units tid with meals and HS Lantus  20 units QHS  Inpatient Diabetes Program Recommendations:    Lantus  25 units at bedtime Novolog  3 units TID with meals if postprandials elevated  Noted pts A1C was 13.2% in September 2025; current A1C is 9.2%.    Thank you, Wyvonna Pinal, MSN, CDCES Diabetes Coordinator Inpatient Diabetes Program [phone] (team pager from 8a-5p)

## 2024-02-21 NOTE — Progress Notes (Signed)
 " PROGRESS NOTE    Nicole [name]  FMW:[id number] DOB: 19-Sep-1965 DOA: 02/14/2024 PCP: Shona Norleen PEDLAR, MD   Brief Narrative:  Patient is a 58 year old female with known history of hypertension hyperlipidemia GERD CAD diabetes type 2, peripheral arterial disease status post left BKA, COPD on room air sleep apnea noncompliant with CPAP and A-fib not currently on anticoagulation.  Patient reports to our facility with worsening right heel wound followed outpatient by Dr. Malvin where she has been having local wound debridement with no improvement.  Of note she was recently admitted to our facility on 9/17 to 9/23 for the same infection - at which time vascular surgery was consulted and recommended BKA, patient refused surgery at that time.  Assessment & Plan:   Principal Problem:   Non-healing open wound of right heel  Chronic diabetic foot ulcer with acute superimposed cellulitis, POA Polymicrobial wound cultures Proteus, Pseudomonas, staph Peripheral artery disease, chronic, moderate to severe - Transition to Zosyn  to cover Pseudomonas - Podiatry following: 12/26: debridement of right heel; partial calcaneal removal, implantation of antibiotic beads - Currently on wound vac -non weightbearing -only to use foot for transfers as tolerated per podiatry - Pain currently well-controlled on oxycodone  5 mg every 4 hour - Wound care to follow - Continue Plavix , rosuvastatin   OSA on CPAP - Continue CPAP at night Tremor/diabetic neuropathy -chronic, continue home gabapentin .  Mood disorder, unspecified - Stable; Continue home buspirone  Essential hypertension - Continue metoprolol , Imdur  Mixed hyperlipidemia - Continue Crestor  History of CAD s/p DES to RCA in 2006 and another 2 stents in 2009  LHC in 2023 with nonobstructive CAD. Continue home Plavix , metoprolol , Imdur  and Crestor .   Uncontrolled type 2 diabetes mellitus with hyperglycemia -A1c 9.2(down from 13 a few months prior) - while  improved is still markedly uncontrolled - Increase glargine 35 units(previously 20) -continues to be hyperglycemic >300 despite increased regimen over the past 48 hours  GERD - Continue Protonix  Obesity class II (BMI 35.87) - Diet and lifestyle modification discussed and recommended    DVT prophylaxis: SCDs Start: 02/17/24 1555 SCDs Start: 02/15/24 0741 Code Status:   Code Status: Full Code Family Communication: None present  Status is: Inpatient  Dispo: The patient is from: Home              Anticipated d/c is to: To be determined - patient prefers discharge home with home health and PT, may be complicated by wound VAC and nonambulatory status              Anticipated d/c date is: Imminent              Patient currently is medically stable for discharge  Consultants:  Podiatry  Procedures:  Right heel debridement 12/26  Antimicrobials:  Unasyn , linezolid   Subjective: No acute issues or events overnight denies nausea vomiting diarrhea constipation headache fevers chills or chest pain - post operative pain well controlled on current regimen  Objective: Vitals:   02/20/24 2218 02/20/24 2328 02/21/24 0635 02/21/24 0721  BP:  (!) 156/67 (!) 158/69 (!) 154/47  Pulse: 78 74 70 70  Resp: (!) 26 (!) 24 (!) 24 19  Temp:  98.1 F (36.7 C) 98.3 F (36.8 C) 98.4 F (36.9 C)  TempSrc:   Oral Oral  SpO2: 93% 98% 99% 97%  Weight:      Height:        Intake/Output Summary (Last 24 hours) at 02/21/2024 1438 Last data filed at 02/21/2024 9372 Gross  per 24 hour  Intake 404.66 ml  Output 625 ml  Net -220.34 ml   Filed Weights   02/14/24 2224 02/17/24 1258  Weight: 94.8 kg 94.8 kg    Examination:  General:  Pleasantly resting in bed, No acute distress. HEENT:  Normocephalic atraumatic.  Sclerae nonicteric, noninjected.  Extraocular movements intact bilaterally. Neck:  Without mass or deformity.  Trachea is midline. Lungs:  Clear to auscultate bilaterally without rhonchi,  wheeze, or rales. Heart:  Regular rate and rhythm.  Without murmurs, rubs, or gallops. Abdomen:  Soft, nontender, nondistended.  Without guarding or rebound. Extremities: R foot wound vac intact - currently in air due to seal leak   Data Reviewed: I have personally reviewed following labs and imaging studies  CBC: Recent Labs  Lab 02/15/24 0108 02/16/24 0909 02/17/24 0637 02/19/24 0335 02/21/24 0439  WBC 9.5 6.7 6.4 8.3 7.8  NEUTROABS 5.8  --   --   --   --   HGB 10.5* 10.0* 10.2* 10.0* 9.6*  HCT 32.8* 31.0* 30.4* 30.3* 29.9*  MCV 94.3 92.8 90.7 90.4 92.3  PLT 152 138* 144* 150 138*   Basic Metabolic Panel: Recent Labs  Lab 02/15/24 0108 02/16/24 0909 02/17/24 0637 02/19/24 0335 02/21/24 0439  NA 141 134* 138 136 137  K 4.1 3.9 3.9 4.3 4.2  CL 103 97* 99 98 99  CO2 32 29 31 29 29   GLUCOSE 158* 358* 221* 249* 261*  BUN 25* 19 16 16 20   CREATININE 0.87 0.76 0.68 0.80 0.87  CALCIUM  9.3 8.7* 9.2 9.1 9.2  MG 1.8 1.5*  --   --   --   PHOS  --  3.0  --   --   --    GFR: Estimated Creatinine Clearance: 76.3 mL/min (by C-G formula based on SCr of 0.87 mg/dL). Liver Function Tests: Recent Labs  Lab 02/15/24 0108 02/16/24 0909  AST 44* 29  ALT 23 19  ALKPHOS 91 71  BILITOT 0.4 0.4  PROT 6.6 6.1*  ALBUMIN 3.5 3.2*   Coagulation Profile: Recent Labs  Lab 02/15/24 0108  INR 1.0   HbA1C: Lab Results  Component Value Date   HGBA1C 9.2 (H) 02/17/2024   CBG: Recent Labs  Lab 02/20/24 1651 02/20/24 2104 02/21/24 0634 02/21/24 0720 02/21/24 1156  GLUCAP 210* 267* 232* 201* 311*   Sepsis Labs: Recent Labs  Lab 02/15/24 0108  LATICACIDVEN 1.9    Recent Results (from the past 240 hours)  Blood Culture (routine x 2)     Status: None   Collection Time: 02/15/24  1:08 AM   Specimen: BLOOD  Result Value Ref Range Status   Specimen Description BLOOD BLOOD LEFT ARM  Final   Special Requests   Final    BOTTLES DRAWN AEROBIC AND ANAEROBIC Blood Culture  adequate volume   Culture   Final    NO GROWTH 5 DAYS Performed at [facility], [address], [place], [place] [address]    Report Status 02/20/2024 FINAL  Final  Blood Culture (routine x 2)     Status: None   Collection Time: 02/15/24  1:11 AM   Specimen: BLOOD  Result Value Ref Range Status   Specimen Description BLOOD BLOOD RIGHT ARM  Final   Special Requests AEROBIC BOTTLE ONLY Blood Culture adequate volume  Final   Culture   Final    NO GROWTH 5 DAYS Performed at [facility], [address], [place], [place] [address]    Report Status  02/20/2024 FINAL  Final  Aerobic/Anaerobic Culture w Gram Stain (surgical/deep wound)     Status: None   Collection Time: 02/17/24  2:05 PM   Specimen: Soft Tissue, Other  Result Value Ref Range Status   Specimen Description TISSUE  Final   Special Requests RIGHT HEEL TISSUE  Final   Gram Stain NO WBC SEEN RARE GRAM POSITIVE COCCI IN PAIRS   Final   Culture   Final    MODERATE PROTEUS MIRABILIS RARE STAPHYLOCOCCUS SIMULANS RARE PSEUDOMONAS AERUGINOSA NO ANAEROBES ISOLATED Performed at [facility], [address], [place], [place] [address]    Report Status 02/21/2024 FINAL  Final   Organism ID, Bacteria PROTEUS MIRABILIS  Final   Organism ID, Bacteria STAPHYLOCOCCUS SIMULANS  Final   Organism ID, Bacteria PSEUDOMONAS AERUGINOSA  Final      Susceptibility   Pseudomonas aeruginosa - MIC*    MEROPENEM 1 SENSITIVE Sensitive     CIPROFLOXACIN 0.25 SENSITIVE Sensitive     IMIPENEM 2 SENSITIVE Sensitive     PIP/TAZO Value in next row Sensitive      <=4 SENSITIVEThis is a modified FDA-approved test that has been validated and its performance characteristics determined by the reporting laboratory.  This laboratory is certified under the Clinical Laboratory Improvement Amendments CLIA as qualified to perform high complexity clinical laboratory testing.    CEFEPIME  Value in next row Sensitive      <=4 SENSITIVEThis is a modified  FDA-approved test that has been validated and its performance characteristics determined by the reporting laboratory.  This laboratory is certified under the Clinical Laboratory Improvement Amendments CLIA as qualified to perform high complexity clinical laboratory testing.    CEFTAZIDIME/AVIBACTAM Value in next row Sensitive      <=4 SENSITIVEThis is a modified FDA-approved test that has been validated and its performance characteristics determined by the reporting laboratory.  This laboratory is certified under the Clinical Laboratory Improvement Amendments CLIA as qualified to perform high complexity clinical laboratory testing.    CEFTOLOZANE/TAZOBACTAM Value in next row Sensitive      <=4 SENSITIVEThis is a modified FDA-approved test that has been validated and its performance characteristics determined by the reporting laboratory.  This laboratory is certified under the Clinical Laboratory Improvement Amendments CLIA as qualified to perform high complexity clinical laboratory testing.    TOBRAMYCIN  Value in next row Sensitive      <=4 SENSITIVEThis is a modified FDA-approved test that has been validated and its performance characteristics determined by the reporting laboratory.  This laboratory is certified under the Clinical Laboratory Improvement Amendments CLIA as qualified to perform high complexity clinical laboratory testing.    CEFTAZIDIME Value in next row Sensitive      <=4 SENSITIVEThis is a modified FDA-approved test that has been validated and its performance characteristics determined by the reporting laboratory.  This laboratory is certified under the Clinical Laboratory Improvement Amendments CLIA as qualified to perform high complexity clinical laboratory testing.    * RARE PSEUDOMONAS AERUGINOSA   Proteus mirabilis - MIC*    AMPICILLIN  Value in next row Sensitive      <=4 SENSITIVEThis is a modified FDA-approved test that has been validated and its performance characteristics  determined by the reporting laboratory.  This laboratory is certified under the Clinical Laboratory Improvement Amendments CLIA as qualified to perform high complexity clinical laboratory testing.    CEFAZOLIN  (NON-URINE) Value in next row Intermediate      <=4 SENSITIVEThis is a modified FDA-approved test that has  been validated and its performance characteristics determined by the reporting laboratory.  This laboratory is certified under the Clinical Laboratory Improvement Amendments CLIA as qualified to perform high complexity clinical laboratory testing.    CEFEPIME  Value in next row Sensitive      <=4 SENSITIVEThis is a modified FDA-approved test that has been validated and its performance characteristics determined by the reporting laboratory.  This laboratory is certified under the Clinical Laboratory Improvement Amendments CLIA as qualified to perform high complexity clinical laboratory testing.    ERTAPENEM Value in next row Sensitive      <=4 SENSITIVEThis is a modified FDA-approved test that has been validated and its performance characteristics determined by the reporting laboratory.  This laboratory is certified under the Clinical Laboratory Improvement Amendments CLIA as qualified to perform high complexity clinical laboratory testing.    CEFTRIAXONE  Value in next row Sensitive      <=4 SENSITIVEThis is a modified FDA-approved test that has been validated and its performance characteristics determined by the reporting laboratory.  This laboratory is certified under the Clinical Laboratory Improvement Amendments CLIA as qualified to perform high complexity clinical laboratory testing.    CIPROFLOXACIN Value in next row Sensitive      <=4 SENSITIVEThis is a modified FDA-approved test that has been validated and its performance characteristics determined by the reporting laboratory.  This laboratory is certified under the Clinical Laboratory Improvement Amendments CLIA as qualified to perform  high complexity clinical laboratory testing.    GENTAMICIN  Value in next row Sensitive      <=4 SENSITIVEThis is a modified FDA-approved test that has been validated and its performance characteristics determined by the reporting laboratory.  This laboratory is certified under the Clinical Laboratory Improvement Amendments CLIA as qualified to perform high complexity clinical laboratory testing.    MEROPENEM Value in next row Sensitive      <=4 SENSITIVEThis is a modified FDA-approved test that has been validated and its performance characteristics determined by the reporting laboratory.  This laboratory is certified under the Clinical Laboratory Improvement Amendments CLIA as qualified to perform high complexity clinical laboratory testing.    TRIMETH /SULFA  Value in next row Sensitive      <=4 SENSITIVEThis is a modified FDA-approved test that has been validated and its performance characteristics determined by the reporting laboratory.  This laboratory is certified under the Clinical Laboratory Improvement Amendments CLIA as qualified to perform high complexity clinical laboratory testing.    AMPICILLIN /SULBACTAM Value in next row Sensitive      <=4 SENSITIVEThis is a modified FDA-approved test that has been validated and its performance characteristics determined by the reporting laboratory.  This laboratory is certified under the Clinical Laboratory Improvement Amendments CLIA as qualified to perform high complexity clinical laboratory testing.    PIP/TAZO Value in next row Sensitive      <=4 SENSITIVEThis is a modified FDA-approved test that has been validated and its performance characteristics determined by the reporting laboratory.  This laboratory is certified under the Clinical Laboratory Improvement Amendments CLIA as qualified to perform high complexity clinical laboratory testing.    * MODERATE PROTEUS MIRABILIS   Staphylococcus simulans - MIC*    CIPROFLOXACIN Value in next row Resistant       <=4 SENSITIVEThis is a modified FDA-approved test that has been validated and its performance characteristics determined by the reporting laboratory.  This laboratory is certified under the Clinical Laboratory Improvement Amendments CLIA as qualified to perform high complexity clinical laboratory testing.  ERYTHROMYCIN Value in next row Resistant      <=4 SENSITIVEThis is a modified FDA-approved test that has been validated and its performance characteristics determined by the reporting laboratory.  This laboratory is certified under the Clinical Laboratory Improvement Amendments CLIA as qualified to perform high complexity clinical laboratory testing.    GENTAMICIN  Value in next row Sensitive      <=4 SENSITIVEThis is a modified FDA-approved test that has been validated and its performance characteristics determined by the reporting laboratory.  This laboratory is certified under the Clinical Laboratory Improvement Amendments CLIA as qualified to perform high complexity clinical laboratory testing.    OXACILLIN Value in next row Resistant      <=4 SENSITIVEThis is a modified FDA-approved test that has been validated and its performance characteristics determined by the reporting laboratory.  This laboratory is certified under the Clinical Laboratory Improvement Amendments CLIA as qualified to perform high complexity clinical laboratory testing.    TETRACYCLINE Value in next row Resistant      <=4 SENSITIVEThis is a modified FDA-approved test that has been validated and its performance characteristics determined by the reporting laboratory.  This laboratory is certified under the Clinical Laboratory Improvement Amendments CLIA as qualified to perform high complexity clinical laboratory testing.    VANCOMYCIN  Value in next row Sensitive      <=4 SENSITIVEThis is a modified FDA-approved test that has been validated and its performance characteristics determined by the reporting laboratory.  This  laboratory is certified under the Clinical Laboratory Improvement Amendments CLIA as qualified to perform high complexity clinical laboratory testing.    TRIMETH /SULFA  Value in next row Sensitive      <=4 SENSITIVEThis is a modified FDA-approved test that has been validated and its performance characteristics determined by the reporting laboratory.  This laboratory is certified under the Clinical Laboratory Improvement Amendments CLIA as qualified to perform high complexity clinical laboratory testing.    CLINDAMYCIN Value in next row Resistant      <=4 SENSITIVEThis is a modified FDA-approved test that has been validated and its performance characteristics determined by the reporting laboratory.  This laboratory is certified under the Clinical Laboratory Improvement Amendments CLIA as qualified to perform high complexity clinical laboratory testing.    RIFAMPIN Value in next row Sensitive      <=4 SENSITIVEThis is a modified FDA-approved test that has been validated and its performance characteristics determined by the reporting laboratory.  This laboratory is certified under the Clinical Laboratory Improvement Amendments CLIA as qualified to perform high complexity clinical laboratory testing.    Inducible Clindamycin Value in next row Sensitive      <=4 SENSITIVEThis is a modified FDA-approved test that has been validated and its performance characteristics determined by the reporting laboratory.  This laboratory is certified under the Clinical Laboratory Improvement Amendments CLIA as qualified to perform high complexity clinical laboratory testing.    * RARE STAPHYLOCOCCUS SIMULANS  Aerobic/Anaerobic Culture w Gram Stain (surgical/deep wound)     Status: None (Preliminary result)   Collection Time: 02/17/24  2:06 PM   Specimen: Bone; Tissue  Result Value Ref Range Status   Specimen Description BONE  Final   Special Requests RIGHT HEEL BONE  Final   Gram Stain   Final    NO WBC SEEN NO  ORGANISMS SEEN Performed at [facility], [address], [place], [place] [address]    Culture   Final    RARE STAPHYLOCOCCUS SIMULANS SUSCEPTIBILITIES PERFORMED ON PREVIOUS CULTURE  WITHIN THE LAST 5 DAYS. NO ANAEROBES ISOLATED; CULTURE IN PROGRESS FOR 5 DAYS    Report Status PENDING  Incomplete   Radiology Studies: No results found.  Scheduled Meds:  busPIRone   5 mg Oral BID   clopidogrel   75 mg Oral Daily   enoxaparin  (LOVENOX ) injection  50 mg Subcutaneous Q24H   gabapentin   300 mg Oral QHS   insulin  aspart  0-20 Units Subcutaneous TID WC   insulin  aspart  0-5 Units Subcutaneous QHS   insulin  glargine  35 Units Subcutaneous QHS   isosorbide  mononitrate  30 mg Oral Daily   metoprolol  tartrate  12.5 mg Oral BID   pantoprazole   40 mg Oral Daily   rosuvastatin   40 mg Oral QHS   torsemide   40 mg Oral Daily   Continuous Infusions:  linezolid  (ZYVOX ) IV 600 mg (02/21/24 1046)   piperacillin -tazobactam (ZOSYN )  IV 3.375 g (02/21/24 0627)     LOS: 6 days   Time spent:  Elsie JAYSON Montclair, DO [facility]  If 7PM-7AM, please contact night-coverage [email or web address]  02/21/2024, 2:38 PM      "

## 2024-02-21 NOTE — Consult Note (Signed)
 WOC Nurse wound follow up Wound type: surgical  Measurement: see note 12/29 Came to unit to retrieve dressings from yesterday, made aware at that time machine is not functioning.  When I arrived to patient's room it is reported machine has been beeping for a long while.  Removed kerlix and ACE; wound has pooled blood around the black foam. Machine was turned off per my instructions about 30 mins prior to this.  Wound bed: bloody; oozing with dressing change  Drainage (amount, consistency, odor) bloody Periwound:macerated  Dressing procedure/placement/frequency: Removed old NPWT dressing (2pc black), Mepitel and silver hydrofiber from the periwound  Cleansed wound with normal saline Periwound protected with no -sting skin prep Silver hydrofiber packed into the wound bed, covered with foam. Secured with kerlix and ACE with mild tension for compression related to amount of bleeding.   Made the nurse aware to monitor for strike though  Patient tolerated procedure well  May have NPWT replaced prior to DC, unclear on DC planning. [place] to attending, podiatry and [facility]   WOC nursing will follow along; for NPWT replacement if needed.   Nicole [name] [facility], CNS, CWON-AP [phone]

## 2024-02-22 DIAGNOSIS — L97419 Non-pressure chronic ulcer of right heel and midfoot with unspecified severity: Secondary | ICD-10-CM | POA: Diagnosis not present

## 2024-02-22 DIAGNOSIS — E11621 Type 2 diabetes mellitus with foot ulcer: Secondary | ICD-10-CM | POA: Diagnosis not present

## 2024-02-22 DIAGNOSIS — S91301A Unspecified open wound, right foot, initial encounter: Secondary | ICD-10-CM | POA: Diagnosis not present

## 2024-02-22 LAB — GLUCOSE, CAPILLARY
Glucose-Capillary: 225 mg/dL — ABNORMAL HIGH (ref 70–99)
Glucose-Capillary: 222 mg/dL — ABNORMAL HIGH (ref 70–99)
Glucose-Capillary: 210 mg/dL — ABNORMAL HIGH (ref 70–99)
Glucose-Capillary: 196 mg/dL — ABNORMAL HIGH (ref 70–99)

## 2024-02-22 LAB — CREATININE, SERUM
Creatinine, Ser: 0.96 mg/dL (ref 0.44–1.00)
GFR, Estimated: >60 mL/min

## 2024-02-22 MED ORDER — INSULIN ASPART 100 UNIT/ML IJ SOLN
3.0000 [IU] | Freq: Three times a day (TID) | INTRAMUSCULAR | Status: DC
  Administered 2024-02-23 – 2024-02-24 (×4): 3 [IU] via SUBCUTANEOUS
  Filled 2024-02-22 (×4): qty 3

## 2024-02-22 MED ORDER — INSULIN GLARGINE 100 UNIT/ML ~~LOC~~ SOLN
40.0000 [IU] | Freq: Every day | SUBCUTANEOUS | Status: DC
  Administered 2024-02-22 – 2024-02-23 (×2): 40 [IU] via SUBCUTANEOUS
  Filled 2024-02-22 (×3): qty 0.4

## 2024-02-22 NOTE — Inpatient Diabetes Management (Signed)
 Inpatient Diabetes Program Recommendations  AACE/ADA: New Consensus Statement on Inpatient Glycemic Control   Target Ranges:  Prepandial:   less than 140 mg/dL      Peak postprandial:   less than 180 mg/dL (1-2 hours)      Critically ill patients:  140 - 180 mg/dL   Lab Results  Component Value Date   GLUCAP 225 (H) 02/22/2024   HGBA1C 9.2 (H) 02/17/2024    Latest Reference Range & Units 02/21/24 07:20 02/21/24 11:56 02/21/24 16:39 02/21/24 20:23 02/22/24 08:55  Glucose-Capillary 70 - 99 mg/dL 798 (H) 688 (H) 744 (H) 198 (H) 225 (H)   Review of Glycemic Control  Diabetes history: DM 2  Outpatient Diabetes medications: U500 60 units tid with meals  Current orders for Inpatient glycemic control:  Novolog  0-20 units TID + 0-5 units HS Lantus  35 units QHS  Inpatient Diabetes Program Recommendations:   Noted pts A1C was 13.2% in September 2025; current A1C is 9.2%.   Please consider:  - Increase Lantus  to 40 units daily  - Add Novolog  3 units TID with meals (hold if pt consumes <50% of meal or NPO)   Thanks,  Lavanda Search, RN, MSN, [facility]  Inpatient Diabetes Coordinator  Pager [phone] (8a-5p)

## 2024-02-22 NOTE — Progress Notes (Signed)
 " PROGRESS NOTE    Nicole [name]  FMW:[id number] DOB: September 13, 1965 DOA: 02/14/2024 PCP: Shona Norleen PEDLAR, MD    Brief Narrative:  Patient is a 58 year old female with known history of hypertension hyperlipidemia GERD CAD diabetes type 2, peripheral arterial disease status post left BKA, COPD, sleep apnea noncompliant with CPAP and A-fib not currently on anticoagulation.   Patient reports to our facility with worsening right heel wound followed outpatient by Dr. Malvin where she has been having local wound debridement with no improvement.  Of note she was recently admitted to our facility on 9/17 to 9/23 for the same infection, at which time vascular surgery was consulted and recommended BKA, patient refused surgery at that time.  She is now s/p debridement of the right heel with partial calcaneal removal on 12/26 by podiatry. Wound vac was placed post debridement but was noted to be malfunctioning on 12/30 and has been removed. WOC following for replacement.   Assessment and Plan:  Chronic diabetic foot ulcer with acute superimposed cellulitis, POA Polymicrobial wound cultures Proteus, Pseudomonas, staph Peripheral artery disease, chronic, moderate to severe - Podiatry following - 12/26: s/p debridement of right heel; partial calcaneal removal, implantation of antibiotic beads - Wound vac removed by WOC RN on 12/30 after it was noted to be malfunctioning - WOC following - Will need podiatry to determine if wound vac is to be replaced prior to discharge - NWB, only to use foot for transfer as tolerated, per podiatry - Pain currently well-controlled on oxycodone  5 mg every 4 hour - Continue Plavix , rosuvastatin  - Continue IV Zosyn  and Linezolid  while inpatient. Will transition to PO Levaquin  750 mg qd  until 1/17 and PO Linezolid  x 1 week followed by PO Bactrim  for 2 weeks to complete 4 weeks of post-op antibiotics.   Uncontrolled type 2 diabetes mellitus with hyperglycemia - Hgb A1c 9.2  (previously 13) - improved but still markedly uncontrolled - Semglee  increased to 40 units nightly - Added Novolog  3 units premeal TID - Continue SSI  OSA on CPAP - Continue CPAP at night Tremor/diabetic neuropathy -chronic, continue home gabapentin .  Mood disorder, unspecified - Stable; Continue home buspirone  Essential hypertension - Continue metoprolol , Imdur  Mixed hyperlipidemia - Continue Crestor  History of CAD s/p DES to RCA in 2006 and another 2 stents in 2009  LHC in 2023 with nonobstructive CAD. Continue home Plavix , metoprolol , Imdur  and Crestor . GERD - Continue Protonix  Obesity class II (BMI 35.87) - Diet and lifestyle modification discussed and recommended  Scheduled Meds:  busPIRone   5 mg Oral BID   clopidogrel   75 mg Oral Daily   enoxaparin  (LOVENOX ) injection  50 mg Subcutaneous Q24H   gabapentin   300 mg Oral QHS   insulin  aspart  0-20 Units Subcutaneous TID WC   insulin  aspart  0-5 Units Subcutaneous QHS   [START ON 02/23/2024] insulin  aspart  3 Units Subcutaneous TID WC   insulin  glargine  40 Units Subcutaneous QHS   isosorbide  mononitrate  30 mg Oral Daily   metoprolol  tartrate  12.5 mg Oral BID   pantoprazole   40 mg Oral Daily   rosuvastatin   40 mg Oral QHS   torsemide   40 mg Oral Daily   Continuous Infusions:  linezolid  (ZYVOX ) IV 600 mg (02/22/24 1025)   piperacillin -tazobactam (ZOSYN )  IV 3.375 g (02/22/24 1422)   PRN Meds:.acetaminophen  **OR** acetaminophen , ondansetron  **OR** ondansetron  (ZOFRAN ) IV, oxyCODONE   Current Outpatient Medications  Medication Instructions   Accu-Chek Softclix Lancets lancets Use as instructed to monitor  glucose 4 times daily- use as backup to Dexcom   acetaminophen  (TYLENOL ) 1,000 mg, As needed   albuterol  (VENTOLIN  HFA) 108 (90 Base) MCG/ACT inhaler 2 puffs, Inhalation, Every 6 hours PRN, Shortness of breath   amoxicillin -clavulanate (AUGMENTIN ) 875-125 MG tablet 1 tablet, Oral, 2 times daily   busPIRone  (BUSPAR ) 5 mg,  Oral, 2 times daily   Cholecalciferol  50,000 Units, Oral, Weekly, On Sunday   clopidogrel  (PLAVIX ) 75 mg, Oral, Daily   EPINEPHrine  (EPI-PEN) 0.3 mg, Intramuscular, As needed   glucose blood (ACCU-CHEK GUIDE TEST) test strip Use as instructed to monitor glucose 4 times daily- use as back up for Dexcom   HumuLIN  R U-500 KwikPen 60 Units, Subcutaneous, 3 times daily with meals   HYDROcodone -acetaminophen  (NORCO/VICODIN) 5-325 MG tablet 1 tablet, Oral, Every 6 hours PRN   Ibuprofen 200 MG CAPS 2 capsules, As needed   Insulin  Pen Needle (B-D ULTRAFINE III SHORT PEN) 31G X 8 MM MISC USE AS DIRECTED   isosorbide  mononitrate (IMDUR ) 30 mg, Oral, Daily   levocetirizine (XYZAL ) 5 mg, Oral, Daily   Melatonin 20 mg, At bedtime PRN   metoprolol  tartrate (LOPRESSOR ) 12.5 mg, Oral, 2 times daily   nitroGLYCERIN  (NITROSTAT ) 0.4 MG SL tablet DISSOLVE ONE TABLET UNDER THE TONGUE EVERY 5 MINUTES AS NEEDED FOR CHEST PAIN.  DO NOT EXCEED A TOTAL OF 3 DOSES IN 15 MINUTES   nystatin  powder 1 Application, Topical, 3 times daily   pantoprazole  (PROTONIX ) 40 mg, Oral, Daily   rosuvastatin  (CRESTOR ) 40 mg, Oral, Daily at bedtime   senna-docusate (SENOKOT-S) 8.6-50 MG tablet 1-2 tablets, Oral, 2 times daily between meals PRN   torsemide  (DEMADEX ) 40 mg, Oral, Daily   Vraylar  3 mg, Daily    DVT prophylaxis: SCDs Start: 02/17/24 1555   Code Status:   Code Status: Full Code  Family Communication: None  Disposition Plan: Likely discharge home once wound vac decision is cleared with podiatry PT - Follow Up Recommendations: Home health PT - PT equipment: None recommended by PT OT -   -    Level of care: Med-Surg  Consultants:  Podiatry   Antimicrobials: Currently IV Linezolid  and Zosyn    Subjective: NAEO. Feels okay. Frustrated with unclear dispo. Discussed that we'll need to clarify wound vac situation with podiatry and WOC.   Objective: Vitals:   02/22/24 0325 02/22/24 0858 02/22/24 1735 02/22/24  2045  BP: (!) 132/58 (!) 141/58 (!) 136/48 (!) 129/53  Pulse: 61 66 69 67  Resp: 16 17  16   Temp: 97.7 F (36.5 C) 98.4 F (36.9 C) 98.4 F (36.9 C) 98.3 F (36.8 C)  TempSrc: Oral Oral Oral Oral  SpO2: 99% 91% 95% 93%  Weight:      Height:        Intake/Output Summary (Last 24 hours) at 02/22/2024 2141 Last data filed at 02/22/2024 2120 Gross per 24 hour  Intake 397.09 ml  Output 2400 ml  Net -2002.91 ml   Filed Weights   02/14/24 2224 02/17/24 1258  Weight: 94.8 kg 94.8 kg    Examination:  Gen: NAD, A&Ox3 HEENT: NCAT, EOMI Neck: Supple, no JVD, no LAD CV: RRR, no murmurs Resp: normal WOB, CTAB, no w/r/r Abd: Soft, NTND, no guarding, BS normoactive Ext: Left BKA with prosthetic in place, RLE in boot Skin: Warm, dry, no rashes/lesions Neuro: No focal deficits Psych: Calm, cooperative, appropriate affect    Data Reviewed: I have personally reviewed following labs and imaging studies  CBC: Recent Labs  Lab  02/16/24 0909 02/17/24 0637 02/19/24 0335 02/21/24 0439  WBC 6.7 6.4 8.3 7.8  HGB 10.0* 10.2* 10.0* 9.6*  HCT 31.0* 30.4* 30.3* 29.9*  MCV 92.8 90.7 90.4 92.3  PLT 138* 144* 150 138*   Basic Metabolic Panel: Recent Labs  Lab 02/16/24 0909 02/17/24 0637 02/19/24 0335 02/21/24 0439 02/22/24 0635  NA 134* 138 136 137  --   K 3.9 3.9 4.3 4.2  --   CL 97* 99 98 99  --   CO2 29 31 29 29   --   GLUCOSE 358* 221* 249* 261*  --   BUN 19 16 16 20   --   CREATININE 0.76 0.68 0.80 0.87 0.96  CALCIUM  8.7* 9.2 9.1 9.2  --   MG 1.5*  --   --   --   --   PHOS 3.0  --   --   --   --    GFR: Estimated Creatinine Clearance: 69.1 mL/min (by C-G formula based on SCr of 0.96 mg/dL). Liver Function Tests: Recent Labs  Lab 02/16/24 0909  AST 29  ALT 19  ALKPHOS 71  BILITOT 0.4  PROT 6.1*  ALBUMIN 3.2*   No results for input(s): LIPASE, AMYLASE in the last 168 hours. No results for input(s): AMMONIA in the last 168 hours. Coagulation Profile: No  results for input(s): INR, PROTIME in the last 168 hours. Cardiac Enzymes: No results for input(s): CKTOTAL, CKMB, CKMBINDEX, TROPONINI in the last 168 hours. BNP (last 3 results) No results for input(s): PROBNP in the last 8760 hours. HbA1C: No results for input(s): HGBA1C in the last 72 hours. CBG: Recent Labs  Lab 02/21/24 2023 02/22/24 0855 02/22/24 1201 02/22/24 1738 02/22/24 2044  GLUCAP 198* 225* 222* 210* 196*   Lipid Profile: No results for input(s): CHOL, HDL, LDLCALC, TRIG, CHOLHDL, LDLDIRECT in the last 72 hours. Thyroid  Function Tests: No results for input(s): TSH, T4TOTAL, FREET4, T3FREE, THYROIDAB in the last 72 hours. Anemia Panel: No results for input(s): VITAMINB12, FOLATE, FERRITIN, TIBC, IRON, RETICCTPCT in the last 72 hours. Sepsis Labs: No results for input(s): PROCALCITON, LATICACIDVEN in the last 168 hours.  Recent Results (from the past 240 hours)  Blood Culture (routine x 2)     Status: None   Collection Time: 02/15/24  1:08 AM   Specimen: BLOOD  Result Value Ref Range Status   Specimen Description BLOOD BLOOD LEFT ARM  Final   Special Requests   Final    BOTTLES DRAWN AEROBIC AND ANAEROBIC Blood Culture adequate volume   Culture   Final    NO GROWTH 5 DAYS Performed at [facility], [address], [place], [place] [address]    Report Status 02/20/2024 FINAL  Final  Blood Culture (routine x 2)     Status: None   Collection Time: 02/15/24  1:11 AM   Specimen: BLOOD  Result Value Ref Range Status   Specimen Description BLOOD BLOOD RIGHT ARM  Final   Special Requests AEROBIC BOTTLE ONLY Blood Culture adequate volume  Final   Culture   Final    NO GROWTH 5 DAYS Performed at [facility], [address] [place], [place] [address]    Report Status 02/20/2024 FINAL  Final  Aerobic/Anaerobic Culture w Gram Stain (surgical/deep wound)     Status: None   Collection Time: 02/17/24  2:05 PM    Specimen: Soft Tissue, Other  Result Value Ref Range Status   Specimen Description TISSUE  Final   Special Requests RIGHT HEEL  TISSUE  Final   Gram Stain NO WBC SEEN RARE GRAM POSITIVE COCCI IN PAIRS   Final   Culture   Final    MODERATE PROTEUS MIRABILIS RARE STAPHYLOCOCCUS SIMULANS RARE PSEUDOMONAS AERUGINOSA NO ANAEROBES ISOLATED Performed at [facility], [address] [place], [place] [address]    Report Status 02/21/2024 FINAL  Final   Organism ID, Bacteria PROTEUS MIRABILIS  Final   Organism ID, Bacteria STAPHYLOCOCCUS SIMULANS  Final   Organism ID, Bacteria PSEUDOMONAS AERUGINOSA  Final      Susceptibility   Pseudomonas aeruginosa - MIC*    MEROPENEM 1 SENSITIVE Sensitive     CIPROFLOXACIN 0.25 SENSITIVE Sensitive     IMIPENEM 2 SENSITIVE Sensitive     PIP/TAZO Value in next row Sensitive      <=4 SENSITIVEThis is a modified FDA-approved test that has been validated and its performance characteristics determined by the reporting laboratory.  This laboratory is certified under the Clinical Laboratory Improvement Amendments CLIA as qualified to perform high complexity clinical laboratory testing.    CEFEPIME  Value in next row Sensitive      <=4 SENSITIVEThis is a modified FDA-approved test that has been validated and its performance characteristics determined by the reporting laboratory.  This laboratory is certified under the Clinical Laboratory Improvement Amendments CLIA as qualified to perform high complexity clinical laboratory testing.    CEFTAZIDIME/AVIBACTAM Value in next row Sensitive      <=4 SENSITIVEThis is a modified FDA-approved test that has been validated and its performance characteristics determined by the reporting laboratory.  This laboratory is certified under the Clinical Laboratory Improvement Amendments CLIA as qualified to perform high complexity clinical laboratory testing.    CEFTOLOZANE/TAZOBACTAM Value in next row Sensitive      <=4  SENSITIVEThis is a modified FDA-approved test that has been validated and its performance characteristics determined by the reporting laboratory.  This laboratory is certified under the Clinical Laboratory Improvement Amendments CLIA as qualified to perform high complexity clinical laboratory testing.    TOBRAMYCIN  Value in next row Sensitive      <=4 SENSITIVEThis is a modified FDA-approved test that has been validated and its performance characteristics determined by the reporting laboratory.  This laboratory is certified under the Clinical Laboratory Improvement Amendments CLIA as qualified to perform high complexity clinical laboratory testing.    CEFTAZIDIME Value in next row Sensitive      <=4 SENSITIVEThis is a modified FDA-approved test that has been validated and its performance characteristics determined by the reporting laboratory.  This laboratory is certified under the Clinical Laboratory Improvement Amendments CLIA as qualified to perform high complexity clinical laboratory testing.    * RARE PSEUDOMONAS AERUGINOSA   Proteus mirabilis - MIC*    AMPICILLIN  Value in next row Sensitive      <=4 SENSITIVEThis is a modified FDA-approved test that has been validated and its performance characteristics determined by the reporting laboratory.  This laboratory is certified under the Clinical Laboratory Improvement Amendments CLIA as qualified to perform high complexity clinical laboratory testing.    CEFAZOLIN  (NON-URINE) Value in next row Intermediate      <=4 SENSITIVEThis is a modified FDA-approved test that has been validated and its performance characteristics determined by the reporting laboratory.  This laboratory is certified under the Clinical Laboratory Improvement Amendments CLIA as qualified to perform high complexity clinical laboratory testing.    CEFEPIME  Value in next row Sensitive      <=4 SENSITIVEThis is a modified FDA-approved  test that has been validated and its performance  characteristics determined by the reporting laboratory.  This laboratory is certified under the Clinical Laboratory Improvement Amendments CLIA as qualified to perform high complexity clinical laboratory testing.    ERTAPENEM Value in next row Sensitive      <=4 SENSITIVEThis is a modified FDA-approved test that has been validated and its performance characteristics determined by the reporting laboratory.  This laboratory is certified under the Clinical Laboratory Improvement Amendments CLIA as qualified to perform high complexity clinical laboratory testing.    CEFTRIAXONE  Value in next row Sensitive      <=4 SENSITIVEThis is a modified FDA-approved test that has been validated and its performance characteristics determined by the reporting laboratory.  This laboratory is certified under the Clinical Laboratory Improvement Amendments CLIA as qualified to perform high complexity clinical laboratory testing.    CIPROFLOXACIN Value in next row Sensitive      <=4 SENSITIVEThis is a modified FDA-approved test that has been validated and its performance characteristics determined by the reporting laboratory.  This laboratory is certified under the Clinical Laboratory Improvement Amendments CLIA as qualified to perform high complexity clinical laboratory testing.    GENTAMICIN  Value in next row Sensitive      <=4 SENSITIVEThis is a modified FDA-approved test that has been validated and its performance characteristics determined by the reporting laboratory.  This laboratory is certified under the Clinical Laboratory Improvement Amendments CLIA as qualified to perform high complexity clinical laboratory testing.    MEROPENEM Value in next row Sensitive      <=4 SENSITIVEThis is a modified FDA-approved test that has been validated and its performance characteristics determined by the reporting laboratory.  This laboratory is certified under the Clinical Laboratory Improvement Amendments CLIA as qualified to perform  high complexity clinical laboratory testing.    TRIMETH /SULFA  Value in next row Sensitive      <=4 SENSITIVEThis is a modified FDA-approved test that has been validated and its performance characteristics determined by the reporting laboratory.  This laboratory is certified under the Clinical Laboratory Improvement Amendments CLIA as qualified to perform high complexity clinical laboratory testing.    AMPICILLIN /SULBACTAM Value in next row Sensitive      <=4 SENSITIVEThis is a modified FDA-approved test that has been validated and its performance characteristics determined by the reporting laboratory.  This laboratory is certified under the Clinical Laboratory Improvement Amendments CLIA as qualified to perform high complexity clinical laboratory testing.    PIP/TAZO Value in next row Sensitive      <=4 SENSITIVEThis is a modified FDA-approved test that has been validated and its performance characteristics determined by the reporting laboratory.  This laboratory is certified under the Clinical Laboratory Improvement Amendments CLIA as qualified to perform high complexity clinical laboratory testing.    * MODERATE PROTEUS MIRABILIS   Staphylococcus simulans - MIC*    CIPROFLOXACIN Value in next row Resistant      <=4 SENSITIVEThis is a modified FDA-approved test that has been validated and its performance characteristics determined by the reporting laboratory.  This laboratory is certified under the Clinical Laboratory Improvement Amendments CLIA as qualified to perform high complexity clinical laboratory testing.    ERYTHROMYCIN Value in next row Resistant      <=4 SENSITIVEThis is a modified FDA-approved test that has been validated and its performance characteristics determined by the reporting laboratory.  This laboratory is certified under the Clinical Laboratory Improvement Amendments CLIA as qualified to perform high complexity clinical laboratory testing.  GENTAMICIN  Value in next row  Sensitive      <=4 SENSITIVEThis is a modified FDA-approved test that has been validated and its performance characteristics determined by the reporting laboratory.  This laboratory is certified under the Clinical Laboratory Improvement Amendments CLIA as qualified to perform high complexity clinical laboratory testing.    OXACILLIN Value in next row Resistant      <=4 SENSITIVEThis is a modified FDA-approved test that has been validated and its performance characteristics determined by the reporting laboratory.  This laboratory is certified under the Clinical Laboratory Improvement Amendments CLIA as qualified to perform high complexity clinical laboratory testing.    TETRACYCLINE Value in next row Resistant      <=4 SENSITIVEThis is a modified FDA-approved test that has been validated and its performance characteristics determined by the reporting laboratory.  This laboratory is certified under the Clinical Laboratory Improvement Amendments CLIA as qualified to perform high complexity clinical laboratory testing.    VANCOMYCIN  Value in next row Sensitive      <=4 SENSITIVEThis is a modified FDA-approved test that has been validated and its performance characteristics determined by the reporting laboratory.  This laboratory is certified under the Clinical Laboratory Improvement Amendments CLIA as qualified to perform high complexity clinical laboratory testing.    TRIMETH /SULFA  Value in next row Sensitive      <=4 SENSITIVEThis is a modified FDA-approved test that has been validated and its performance characteristics determined by the reporting laboratory.  This laboratory is certified under the Clinical Laboratory Improvement Amendments CLIA as qualified to perform high complexity clinical laboratory testing.    CLINDAMYCIN Value in next row Resistant      <=4 SENSITIVEThis is a modified FDA-approved test that has been validated and its performance characteristics determined by the reporting  laboratory.  This laboratory is certified under the Clinical Laboratory Improvement Amendments CLIA as qualified to perform high complexity clinical laboratory testing.    RIFAMPIN Value in next row Sensitive      <=4 SENSITIVEThis is a modified FDA-approved test that has been validated and its performance characteristics determined by the reporting laboratory.  This laboratory is certified under the Clinical Laboratory Improvement Amendments CLIA as qualified to perform high complexity clinical laboratory testing.    Inducible Clindamycin Value in next row Sensitive      <=4 SENSITIVEThis is a modified FDA-approved test that has been validated and its performance characteristics determined by the reporting laboratory.  This laboratory is certified under the Clinical Laboratory Improvement Amendments CLIA as qualified to perform high complexity clinical laboratory testing.    * RARE STAPHYLOCOCCUS SIMULANS  Aerobic/Anaerobic Culture w Gram Stain (surgical/deep wound)     Status: None (Preliminary result)   Collection Time: 02/17/24  2:06 PM   Specimen: Bone; Tissue  Result Value Ref Range Status   Specimen Description BONE  Final   Special Requests RIGHT HEEL BONE  Final   Gram Stain NO WBC SEEN NO ORGANISMS SEEN   Final   Culture   Final    RARE STAPHYLOCOCCUS SIMULANS SUSCEPTIBILITIES PERFORMED ON PREVIOUS CULTURE WITHIN THE LAST 5 DAYS. NO ANAEROBES ISOLATED Performed at [facility], [address] [place], [place] [address]    Report Status PENDING  Incomplete     Radiology Studies: No results found.  Scheduled Meds:  busPIRone   5 mg Oral BID   clopidogrel   75 mg Oral Daily   enoxaparin  (LOVENOX ) injection  50 mg Subcutaneous Q24H   gabapentin   300 mg  Oral QHS   insulin  aspart  0-20 Units Subcutaneous TID WC   insulin  aspart  0-5 Units Subcutaneous QHS   [START ON 02/23/2024] insulin  aspart  3 Units Subcutaneous TID WC   insulin  glargine  40 Units Subcutaneous QHS    isosorbide  mononitrate  30 mg Oral Daily   metoprolol  tartrate  12.5 mg Oral BID   pantoprazole   40 mg Oral Daily   rosuvastatin   40 mg Oral QHS   torsemide   40 mg Oral Daily   Continuous Infusions:  linezolid  (ZYVOX ) IV 600 mg (02/22/24 1025)   piperacillin -tazobactam (ZOSYN )  IV 3.375 g (02/22/24 1422)     Unresulted Labs (From admission, onward)     Start     Ordered   02/22/24 0500  Creatinine, serum  (enoxaparin  (LOVENOX )    CrCl >/= 30 ml/min)  Weekly,   R     Comments: while on enoxaparin  therapy    02/15/24 0741   02/21/24 0500  Basic metabolic panel with GFR  Every 48 hours,   R (with TIMED occurrences)      02/19/24 1400   02/21/24 0500  CBC  Every 48 hours,   R (with TIMED occurrences)      02/19/24 1400             LOS:  LOS: 7 days   Time Spent: 55 minutes  Charissa Knowles Al-Sultani, MD [facility]  If 7PM-7AM, please contact night-coverage  02/22/2024, 9:41 PM      "

## 2024-02-22 NOTE — Plan of Care (Signed)
  Problem: Education: Goal: Knowledge of General Education information will improve Description: Including pain rating scale, medication(s)/side effects and non-pharmacologic comfort measures Outcome: Progressing   Problem: Clinical Measurements: Goal: Cardiovascular complication will be avoided Outcome: Progressing   Problem: Nutrition: Goal: Adequate nutrition will be maintained Outcome: Progressing   Problem: Coping: Goal: Level of anxiety will decrease Outcome: Progressing   Problem: Pain Managment: Goal: General experience of comfort will improve and/or be controlled Outcome: Progressing

## 2024-02-22 NOTE — Progress Notes (Signed)
 Physical Therapy Treatment Patient Details Name: Nicole [name] MRN: [id number] DOB: August 27, 1965 Today's Date: 02/22/2024   History of Present Illness 58y.o. female admitted on 02/14/24 secondary to non-healing wound on R heel. Underwent debridement of R heel 12/26 with partial calcaneal removal, currently with wound vac and WBAT RLE in CAM boot. PMH: GERD, HTN, hyperlipidemia, CAD, T2DM, PAD, L BKA, COPD, and afib.    PT Comments  Pt admitted with above diagnosis. Pt was able to transfer to chair with min assist and mod cues for safety. Pt appears steadier than last visit.  Pt oriented x 3 today. Will continue to follow acutely and progress pt as able.  Pt currently with functional limitations due to the deficits listed below (see PT Problem List). Pt will benefit from acute skilled PT to increase their independence and safety with mobility to allow discharge.       If plan is discharge home, recommend the following: A little help with walking and/or transfers;A little help with bathing/dressing/bathroom;Assist for transportation;Help with stairs or ramp for entrance;Assistance with cooking/housework   Can travel by private vehicle        Equipment Recommendations  None recommended by PT    Recommendations for Other Services       Precautions / Restrictions Precautions Precautions: None Recall of Precautions/Restrictions: Intact Restrictions Weight Bearing Restrictions Per Provider Order: Yes RLE Weight Bearing Per Provider Order: Weight bearing as tolerated Other Position/Activity Restrictions: weight bearing in CAM boot for transfers only, minimal weight through foot     Mobility  Bed Mobility Overal bed mobility: Needs Assistance Bed Mobility: Supine to Sit     Supine to sit: HOB elevated, Used rails, Min assist     General bed mobility comments: assistance to elevate trunk from bed surface, VC for sequencing and movement of BLE    Transfers Overall transfer level:  Needs assistance Equipment used: Rolling walker (2 wheels) Transfers: Sit to/from Stand, Bed to chair/wheelchair/BSC Sit to Stand: Min assist, From elevated surface Stand pivot transfers: Min assist         General transfer comment: Pt donned prosthesis sitting EOB.  Needed assist to don CAM boot. Pt was min A with RW for x4 sit<>stands from EOB, recliner  with VC for proper hand placement; Min A for stand pivot transfer to assist with balance, and provide VC to back up enough before sitting and use of hands to aid in lowering to chair    Ambulation/Gait               General Gait Details: Order for transfers only   Stairs             Wheelchair Mobility     Tilt Bed    Modified Rankin (Stroke Patients Only)       Balance Overall balance assessment: Needs assistance Sitting-balance support: Feet supported, Bilateral upper extremity supported Sitting balance-Leahy Scale: Fair     Standing balance support: During functional activity, Reliant on assistive device for balance Standing balance-Leahy Scale: Poor Standing balance comment: heavy support on RW, tendency to lean back requiring cues to improve posture and anterior weight shift for balance                            Communication Communication Communication: No apparent difficulties  Cognition Arousal: Alert Behavior During Therapy: WFL for tasks assessed/performed   PT - Cognitive impairments: No apparent impairments  PT - Cognition Comments: flat affect, delayed responses Following commands: Intact      Cueing Cueing Techniques: Verbal cues, Tactile cues  Exercises General Exercises - Lower Extremity Long Arc Quad: AROM, 15 reps, Seated Hip Flexion/Marching: AROM, Both, 15 reps, Seated    General Comments        Pertinent Vitals/Pain Pain Assessment Pain Assessment: 0-10 Pain Score: 6  Pain Location: R foot Pain Descriptors / Indicators:  Aching, Sharp, Tingling, Grimacing, Discomfort Pain Intervention(s): Limited activity within patient's tolerance, Monitored during session, Repositioned    Home Living                          Prior Function            PT Goals (current goals can now be found in the care plan section) Acute Rehab PT Goals Patient Stated Goal: get back home and move well Progress towards PT goals: Progressing toward goals    Frequency    Min 2X/week      PT Plan      Co-evaluation              AM-PAC PT 6 Clicks Mobility   Outcome Measure  Help needed turning from your back to your side while in a flat bed without using bedrails?: A Lot Help needed moving from lying on your back to sitting on the side of a flat bed without using bedrails?: A Lot Help needed moving to and from a bed to a chair (including a wheelchair)?: A Lot Help needed standing up from a chair using your arms (e.g., wheelchair or bedside chair)?: A Lot Help needed to walk in hospital room?: Total Help needed climbing 3-5 steps with a railing? : Total 6 Click Score: 10    End of Session Equipment Utilized During Treatment: Gait belt Activity Tolerance: Patient tolerated treatment well Patient left: in chair;with call bell/phone within reach;with chair alarm set Nurse Communication: Mobility status;Need for lift equipment Laurent) PT Visit Diagnosis: Pain;Unsteadiness on feet (R26.81);Muscle weakness (generalized) (M62.81) Pain - Right/Left: Right Pain - part of body: Ankle and joints of foot     Time: 8867-8851 PT Time Calculation (min) (ACUTE ONLY): 16 min  Charges:    $Therapeutic Activity: 8-22 mins PT General Charges $$ ACUTE PT VISIT: 1 Visit                     Laquashia Mergenthaler M,PT Acute Rehab Services [phone]    Stephane JULIANNA Bevel 02/22/2024, 1:18 PM

## 2024-02-23 DIAGNOSIS — S91301A Unspecified open wound, right foot, initial encounter: Secondary | ICD-10-CM | POA: Diagnosis not present

## 2024-02-23 LAB — CBC
HCT: 29.5 % — ABNORMAL LOW (ref 36.0–46.0)
Hemoglobin: 9.7 g/dL — ABNORMAL LOW (ref 12.0–15.0)
MCH: 30 pg (ref 26.0–34.0)
MCHC: 32.9 g/dL (ref 30.0–36.0)
MCV: 91.3 fL (ref 80.0–100.0)
Platelets: 141 K/uL — ABNORMAL LOW (ref 150–400)
RBC: 3.23 MIL/uL — ABNORMAL LOW (ref 3.87–5.11)
RDW: 13.9 % (ref 11.5–15.5)
WBC: 5.4 K/uL (ref 4.0–10.5)
nRBC: 0 % (ref 0.0–0.2)

## 2024-02-23 LAB — BASIC METABOLIC PANEL WITH GFR
Anion gap: 10 (ref 5–15)
BUN: 25 mg/dL — ABNORMAL HIGH (ref 6–20)
CO2: 32 mmol/L (ref 22–32)
Calcium: 9.3 mg/dL (ref 8.9–10.3)
Chloride: 99 mmol/L (ref 98–111)
Creatinine, Ser: 1.08 mg/dL — ABNORMAL HIGH (ref 0.44–1.00)
GFR, Estimated: 59 mL/min — ABNORMAL LOW
Glucose, Bld: 180 mg/dL — ABNORMAL HIGH (ref 70–99)
Potassium: 3.9 mmol/L (ref 3.5–5.1)
Sodium: 140 mmol/L (ref 135–145)

## 2024-02-23 LAB — GLUCOSE, CAPILLARY
Glucose-Capillary: 184 mg/dL — ABNORMAL HIGH (ref 70–99)
Glucose-Capillary: 259 mg/dL — ABNORMAL HIGH (ref 70–99)
Glucose-Capillary: 166 mg/dL — ABNORMAL HIGH (ref 70–99)
Glucose-Capillary: 136 mg/dL — ABNORMAL HIGH (ref 70–99)

## 2024-02-23 LAB — AEROBIC/ANAEROBIC CULTURE W GRAM STAIN (SURGICAL/DEEP WOUND): Gram Stain: NONE SEEN

## 2024-02-23 NOTE — Plan of Care (Signed)
" °  Problem: Education: Goal: Knowledge of General Education information will improve Description: Including pain rating scale, medication(s)/side effects and non-pharmacologic comfort measures Outcome: Progressing   Problem: Health Behavior/Discharge Planning: Goal: Ability to manage health-related needs will improve Outcome: Progressing   Problem: Clinical Measurements: Goal: Ability to maintain clinical measurements within normal limits will improve Outcome: Progressing Goal: Will remain free from infection Outcome: Progressing Goal: Diagnostic test results will improve Outcome: Progressing Goal: Respiratory complications will improve Outcome: Progressing Goal: Cardiovascular complication will be avoided Outcome: Progressing   Problem: Activity: Goal: Risk for activity intolerance will decrease Outcome: Progressing   Problem: Nutrition: Goal: Adequate nutrition will be maintained Outcome: Progressing   Problem: Coping: Goal: Level of anxiety will decrease Outcome: Progressing   Problem: Elimination: Goal: Will not experience complications related to bowel motility Outcome: Progressing Goal: Will not experience complications related to urinary retention Outcome: Progressing   Problem: Pain Managment: Goal: General experience of comfort will improve and/or be controlled Outcome: Progressing   Problem: Safety: Goal: Ability to remain free from injury will improve Outcome: Progressing   Problem: Coping: Goal: Ability to adjust to condition or change in health will improve Outcome: Progressing   Problem: Fluid Volume: Goal: Ability to maintain a balanced intake and output will improve Outcome: Progressing   Problem: Metabolic: Goal: Ability to maintain appropriate glucose levels will improve Outcome: Progressing   Problem: Nutritional: Goal: Maintenance of adequate nutrition will improve Outcome: Progressing Goal: Progress toward achieving an optimal weight  will improve Outcome: Progressing   Problem: Skin Integrity: Goal: Risk for impaired skin integrity will decrease Outcome: Progressing   "

## 2024-02-23 NOTE — Progress Notes (Addendum)
 " PROGRESS NOTE    Nicole [name]  FMW:[id number] DOB: August 02, 1965 DOA: 02/14/2024 PCP: Shona Norleen PEDLAR, MD    Brief Narrative:  Patient is a 59 year old female with known history of hypertension hyperlipidemia GERD CAD diabetes type 2, peripheral arterial disease status post left BKA, COPD, sleep apnea noncompliant with CPAP and A-fib not currently on anticoagulation.   Patient reports to our facility with worsening right heel wound followed outpatient by Dr. Malvin where she has been having local wound debridement with no improvement.  Of note she was recently admitted to our facility on 9/17 to 9/23 for the same infection, at which time vascular surgery was consulted and recommended BKA, patient refused surgery at that time.  She is now s/p debridement of the right heel with partial calcaneal removal on 12/26 by podiatry. Wound vac was placed post debridement but was noted to be malfunctioning on 12/30 and has been removed. WOC following for replacement.   Assessment and Plan:  Chronic diabetic foot ulcer with acute superimposed cellulitis, POA Polymicrobial wound cultures Proteus, Pseudomonas, staph Peripheral artery disease, chronic, moderate to severe - Podiatry following - 12/26: s/p debridement of right heel; partial calcaneal removal, implantation of antibiotic beads - Wound vac removed by WOC RN on 12/30 after it was noted to be malfunctioning - WOC following, will discuss replacement of wound vac tomorrow - NWB, only to use foot for transfer as tolerated, per podiatry - Pain currently well-controlled on oxycodone  5 mg every 4 hour - Continue Plavix , rosuvastatin  - Continue IV Zosyn  and Linezolid  while inpatient. Will transition to PO Levaquin  750 mg qd  until 1/17 and PO Linezolid  x 1 week followed by PO Bactrim  for 2 weeks to complete 4 weeks of post-op antibiotics.   Uncontrolled type 2 diabetes mellitus with hyperglycemia - Hgb A1c 9.2 (previously 13) - improved but still  markedly uncontrolled - Continue Semglee  40 units nightly - Continue Novolog  3 units premeal TID - Continue SSI  OSA on CPAP - Continue CPAP at night Tremor/diabetic neuropathy -chronic, continue home gabapentin .  Mood disorder, unspecified - Stable; Continue home buspirone  Essential hypertension - Continue metoprolol , Imdur  Mixed hyperlipidemia - Continue Crestor  History of CAD s/p DES to RCA in 2006 and another 2 stents in 2009  LHC in 2023 with nonobstructive CAD. Continue home Plavix , metoprolol , Imdur  and Crestor . GERD - Continue Protonix  Obesity class II (BMI 35.87) - Diet and lifestyle modification discussed and recommended  Scheduled Meds:  busPIRone   5 mg Oral BID   clopidogrel   75 mg Oral Daily   enoxaparin  (LOVENOX ) injection  50 mg Subcutaneous Q24H   gabapentin   300 mg Oral QHS   insulin  aspart  0-20 Units Subcutaneous TID WC   insulin  aspart  0-5 Units Subcutaneous QHS   insulin  aspart  3 Units Subcutaneous TID WC   insulin  glargine  40 Units Subcutaneous QHS   isosorbide  mononitrate  30 mg Oral Daily   metoprolol  tartrate  12.5 mg Oral BID   pantoprazole   40 mg Oral Daily   rosuvastatin   40 mg Oral QHS   torsemide   40 mg Oral Daily   Continuous Infusions:  linezolid  (ZYVOX ) IV 600 mg (02/22/24 2148)   piperacillin -tazobactam (ZOSYN )  IV 3.375 g (02/23/24 0526)   PRN Meds:.acetaminophen  **OR** acetaminophen , ondansetron  **OR** ondansetron  (ZOFRAN ) IV, oxyCODONE   Current Outpatient Medications  Medication Instructions   Accu-Chek Softclix Lancets lancets Use as instructed to monitor glucose 4 times daily- use as backup to Dexcom   acetaminophen  (TYLENOL )  1,000 mg, As needed   albuterol  (VENTOLIN  HFA) 108 (90 Base) MCG/ACT inhaler 2 puffs, Inhalation, Every 6 hours PRN, Shortness of breath   amoxicillin -clavulanate (AUGMENTIN ) 875-125 MG tablet 1 tablet, Oral, 2 times daily   busPIRone  (BUSPAR ) 5 mg, Oral, 2 times daily   Cholecalciferol  50,000 Units, Oral,  Weekly, On Sunday   clopidogrel  (PLAVIX ) 75 mg, Oral, Daily   EPINEPHrine  (EPI-PEN) 0.3 mg, Intramuscular, As needed   glucose blood (ACCU-CHEK GUIDE TEST) test strip Use as instructed to monitor glucose 4 times daily- use as back up for Dexcom   HumuLIN  R U-500 KwikPen 60 Units, Subcutaneous, 3 times daily with meals   HYDROcodone -acetaminophen  (NORCO/VICODIN) 5-325 MG tablet 1 tablet, Oral, Every 6 hours PRN   Ibuprofen 200 MG CAPS 2 capsules, As needed   Insulin  Pen Needle (B-D ULTRAFINE III SHORT PEN) 31G X 8 MM MISC USE AS DIRECTED   isosorbide  mononitrate (IMDUR ) 30 mg, Oral, Daily   levocetirizine (XYZAL ) 5 mg, Oral, Daily   Melatonin 20 mg, At bedtime PRN   metoprolol  tartrate (LOPRESSOR ) 12.5 mg, Oral, 2 times daily   nitroGLYCERIN  (NITROSTAT ) 0.4 MG SL tablet DISSOLVE ONE TABLET UNDER THE TONGUE EVERY 5 MINUTES AS NEEDED FOR CHEST PAIN.  DO NOT EXCEED A TOTAL OF 3 DOSES IN 15 MINUTES   nystatin  powder 1 Application, Topical, 3 times daily   pantoprazole  (PROTONIX ) 40 mg, Oral, Daily   rosuvastatin  (CRESTOR ) 40 mg, Oral, Daily at bedtime   senna-docusate (SENOKOT-S) 8.6-50 MG tablet 1-2 tablets, Oral, 2 times daily between meals PRN   torsemide  (DEMADEX ) 40 mg, Oral, Daily   Vraylar  3 mg, Daily    DVT prophylaxis: SCDs Start: 02/17/24 1555   Code Status:   Code Status: Full Code  Family Communication: None  Disposition Plan: Likely discharge home tomorrow if wound vac is replaced PT - Follow Up Recommendations: Home health PT - PT equipment: None recommended by PT OT -   -    Level of care: Med-Surg  Consultants:  Podiatry   Antimicrobials: Currently IV Linezolid  and Zosyn    Subjective: NAEO. Feels okay. No acute complaints. Concerned about discharge timing/date as it needs to align with her daughter's work schedule and availability, stating her daughter is off work on Monday and can pick her up then.   Objective: Vitals:   02/22/24 1735 02/22/24 2045 02/23/24  0426 02/23/24 0843  BP: (!) 136/48 (!) 129/53 127/61 (!) 148/45  Pulse: 69 67 62 62  Resp:  16 17 16   Temp: 98.4 F (36.9 C) 98.3 F (36.8 C) 98.7 F (37.1 C) (!) 97.5 F (36.4 C)  TempSrc: Oral Oral Oral Oral  SpO2: 95% 93% 98% 99%  Weight:      Height:        Intake/Output Summary (Last 24 hours) at 02/23/2024 0915 Last data filed at 02/22/2024 2120 Gross per 24 hour  Intake --  Output 1200 ml  Net -1200 ml   Filed Weights   02/14/24 2224 02/17/24 1258  Weight: 94.8 kg 94.8 kg    Examination:  Gen: NAD, A&Ox3 HEENT: NCAT, EOMI Neck: Supple, no JVD, no LAD CV: RRR, no murmurs Resp: normal WOB, CTAB, no w/r/r Abd: Soft, NTND, no guarding, BS normoactive Ext: Left BKA with prosthetic in place, RLE in boot Skin: Warm, dry, no rashes/lesions Neuro: No focal deficits Psych: Calm, cooperative, appropriate affect    Data Reviewed: I have personally reviewed following labs and imaging studies  CBC: Recent Labs  Lab  02/17/24 9362 02/19/24 0335 02/21/24 0439 02/23/24 0659  WBC 6.4 8.3 7.8 5.4  HGB 10.2* 10.0* 9.6* 9.7*  HCT 30.4* 30.3* 29.9* 29.5*  MCV 90.7 90.4 92.3 91.3  PLT 144* 150 138* 141*   Basic Metabolic Panel: Recent Labs  Lab 02/17/24 0637 02/19/24 0335 02/21/24 0439 02/22/24 0635 02/23/24 0659  NA 138 136 137  --  140  K 3.9 4.3 4.2  --  3.9  CL 99 98 99  --  99  CO2 31 29 29   --  32  GLUCOSE 221* 249* 261*  --  180*  BUN 16 16 20   --  25*  CREATININE 0.68 0.80 0.87 0.96 1.08*  CALCIUM  9.2 9.1 9.2  --  9.3   GFR: Estimated Creatinine Clearance: 61.4 mL/min (A) (by C-G formula based on SCr of 1.08 mg/dL (H)). Liver Function Tests: No results for input(s): AST, ALT, ALKPHOS, BILITOT, PROT, ALBUMIN in the last 168 hours.  No results for input(s): LIPASE, AMYLASE in the last 168 hours. No results for input(s): AMMONIA in the last 168 hours. Coagulation Profile: No results for input(s): INR, PROTIME in the last 168  hours. Cardiac Enzymes: No results for input(s): CKTOTAL, CKMB, CKMBINDEX, TROPONINI in the last 168 hours. BNP (last 3 results) No results for input(s): PROBNP in the last 8760 hours. HbA1C: No results for input(s): HGBA1C in the last 72 hours. CBG: Recent Labs  Lab 02/22/24 0855 02/22/24 1201 02/22/24 1738 02/22/24 2044 02/23/24 0844  GLUCAP 225* 222* 210* 196* 184*   Lipid Profile: No results for input(s): CHOL, HDL, LDLCALC, TRIG, CHOLHDL, LDLDIRECT in the last 72 hours. Thyroid  Function Tests: No results for input(s): TSH, T4TOTAL, FREET4, T3FREE, THYROIDAB in the last 72 hours. Anemia Panel: No results for input(s): VITAMINB12, FOLATE, FERRITIN, TIBC, IRON, RETICCTPCT in the last 72 hours. Sepsis Labs: No results for input(s): PROCALCITON, LATICACIDVEN in the last 168 hours.  Recent Results (from the past 240 hours)  Blood Culture (routine x 2)     Status: None   Collection Time: 02/15/24  1:08 AM   Specimen: BLOOD  Result Value Ref Range Status   Specimen Description BLOOD BLOOD LEFT ARM  Final   Special Requests   Final    BOTTLES DRAWN AEROBIC AND ANAEROBIC Blood Culture adequate volume   Culture   Final    NO GROWTH 5 DAYS Performed at [facility], [address], [place], [place] [address]    Report Status 02/20/2024 FINAL  Final  Blood Culture (routine x 2)     Status: None   Collection Time: 02/15/24  1:11 AM   Specimen: BLOOD  Result Value Ref Range Status   Specimen Description BLOOD BLOOD RIGHT ARM  Final   Special Requests AEROBIC BOTTLE ONLY Blood Culture adequate volume  Final   Culture   Final    NO GROWTH 5 DAYS Performed at [facility], [address], [place], [place] [address]    Report Status 02/20/2024 FINAL  Final  Aerobic/Anaerobic Culture w Gram Stain (surgical/deep wound)     Status: None   Collection Time: 02/17/24  2:05 PM   Specimen: Soft Tissue, Other  Result Value Ref Range Status    Specimen Description TISSUE  Final   Special Requests RIGHT HEEL TISSUE  Final   Gram Stain NO WBC SEEN RARE GRAM POSITIVE COCCI IN PAIRS   Final   Culture   Final    MODERATE PROTEUS MIRABILIS RARE STAPHYLOCOCCUS SIMULANS RARE PSEUDOMONAS AERUGINOSA NO  ANAEROBES ISOLATED Performed at [facility], [address], [place], [place] [address]    Report Status 02/21/2024 FINAL  Final   Organism ID, Bacteria PROTEUS MIRABILIS  Final   Organism ID, Bacteria STAPHYLOCOCCUS SIMULANS  Final   Organism ID, Bacteria PSEUDOMONAS AERUGINOSA  Final      Susceptibility   Pseudomonas aeruginosa - MIC*    MEROPENEM 1 SENSITIVE Sensitive     CIPROFLOXACIN 0.25 SENSITIVE Sensitive     IMIPENEM 2 SENSITIVE Sensitive     PIP/TAZO Value in next row Sensitive      <=4 SENSITIVEThis is a modified FDA-approved test that has been validated and its performance characteristics determined by the reporting laboratory.  This laboratory is certified under the Clinical Laboratory Improvement Amendments CLIA as qualified to perform high complexity clinical laboratory testing.    CEFEPIME  Value in next row Sensitive      <=4 SENSITIVEThis is a modified FDA-approved test that has been validated and its performance characteristics determined by the reporting laboratory.  This laboratory is certified under the Clinical Laboratory Improvement Amendments CLIA as qualified to perform high complexity clinical laboratory testing.    CEFTAZIDIME/AVIBACTAM Value in next row Sensitive      <=4 SENSITIVEThis is a modified FDA-approved test that has been validated and its performance characteristics determined by the reporting laboratory.  This laboratory is certified under the Clinical Laboratory Improvement Amendments CLIA as qualified to perform high complexity clinical laboratory testing.    CEFTOLOZANE/TAZOBACTAM Value in next row Sensitive      <=4 SENSITIVEThis is a modified FDA-approved test that has been validated  and its performance characteristics determined by the reporting laboratory.  This laboratory is certified under the Clinical Laboratory Improvement Amendments CLIA as qualified to perform high complexity clinical laboratory testing.    TOBRAMYCIN  Value in next row Sensitive      <=4 SENSITIVEThis is a modified FDA-approved test that has been validated and its performance characteristics determined by the reporting laboratory.  This laboratory is certified under the Clinical Laboratory Improvement Amendments CLIA as qualified to perform high complexity clinical laboratory testing.    CEFTAZIDIME Value in next row Sensitive      <=4 SENSITIVEThis is a modified FDA-approved test that has been validated and its performance characteristics determined by the reporting laboratory.  This laboratory is certified under the Clinical Laboratory Improvement Amendments CLIA as qualified to perform high complexity clinical laboratory testing.    * RARE PSEUDOMONAS AERUGINOSA   Proteus mirabilis - MIC*    AMPICILLIN  Value in next row Sensitive      <=4 SENSITIVEThis is a modified FDA-approved test that has been validated and its performance characteristics determined by the reporting laboratory.  This laboratory is certified under the Clinical Laboratory Improvement Amendments CLIA as qualified to perform high complexity clinical laboratory testing.    CEFAZOLIN  (NON-URINE) Value in next row Intermediate      <=4 SENSITIVEThis is a modified FDA-approved test that has been validated and its performance characteristics determined by the reporting laboratory.  This laboratory is certified under the Clinical Laboratory Improvement Amendments CLIA as qualified to perform high complexity clinical laboratory testing.    CEFEPIME  Value in next row Sensitive      <=4 SENSITIVEThis is a modified FDA-approved test that has been validated and its performance characteristics determined by the reporting laboratory.  This laboratory  is certified under the Clinical Laboratory Improvement Amendments CLIA as qualified to perform high complexity clinical laboratory testing.  ERTAPENEM Value in next row Sensitive      <=4 SENSITIVEThis is a modified FDA-approved test that has been validated and its performance characteristics determined by the reporting laboratory.  This laboratory is certified under the Clinical Laboratory Improvement Amendments CLIA as qualified to perform high complexity clinical laboratory testing.    CEFTRIAXONE  Value in next row Sensitive      <=4 SENSITIVEThis is a modified FDA-approved test that has been validated and its performance characteristics determined by the reporting laboratory.  This laboratory is certified under the Clinical Laboratory Improvement Amendments CLIA as qualified to perform high complexity clinical laboratory testing.    CIPROFLOXACIN Value in next row Sensitive      <=4 SENSITIVEThis is a modified FDA-approved test that has been validated and its performance characteristics determined by the reporting laboratory.  This laboratory is certified under the Clinical Laboratory Improvement Amendments CLIA as qualified to perform high complexity clinical laboratory testing.    GENTAMICIN  Value in next row Sensitive      <=4 SENSITIVEThis is a modified FDA-approved test that has been validated and its performance characteristics determined by the reporting laboratory.  This laboratory is certified under the Clinical Laboratory Improvement Amendments CLIA as qualified to perform high complexity clinical laboratory testing.    MEROPENEM Value in next row Sensitive      <=4 SENSITIVEThis is a modified FDA-approved test that has been validated and its performance characteristics determined by the reporting laboratory.  This laboratory is certified under the Clinical Laboratory Improvement Amendments CLIA as qualified to perform high complexity clinical laboratory testing.    TRIMETH /SULFA  Value in  next row Sensitive      <=4 SENSITIVEThis is a modified FDA-approved test that has been validated and its performance characteristics determined by the reporting laboratory.  This laboratory is certified under the Clinical Laboratory Improvement Amendments CLIA as qualified to perform high complexity clinical laboratory testing.    AMPICILLIN /SULBACTAM Value in next row Sensitive      <=4 SENSITIVEThis is a modified FDA-approved test that has been validated and its performance characteristics determined by the reporting laboratory.  This laboratory is certified under the Clinical Laboratory Improvement Amendments CLIA as qualified to perform high complexity clinical laboratory testing.    PIP/TAZO Value in next row Sensitive      <=4 SENSITIVEThis is a modified FDA-approved test that has been validated and its performance characteristics determined by the reporting laboratory.  This laboratory is certified under the Clinical Laboratory Improvement Amendments CLIA as qualified to perform high complexity clinical laboratory testing.    * MODERATE PROTEUS MIRABILIS   Staphylococcus simulans - MIC*    CIPROFLOXACIN Value in next row Resistant      <=4 SENSITIVEThis is a modified FDA-approved test that has been validated and its performance characteristics determined by the reporting laboratory.  This laboratory is certified under the Clinical Laboratory Improvement Amendments CLIA as qualified to perform high complexity clinical laboratory testing.    ERYTHROMYCIN Value in next row Resistant      <=4 SENSITIVEThis is a modified FDA-approved test that has been validated and its performance characteristics determined by the reporting laboratory.  This laboratory is certified under the Clinical Laboratory Improvement Amendments CLIA as qualified to perform high complexity clinical laboratory testing.    GENTAMICIN  Value in next row Sensitive      <=4 SENSITIVEThis is a modified FDA-approved test that has been  validated and its performance characteristics determined by the reporting laboratory.  This laboratory is  certified under the Clinical Laboratory Improvement Amendments CLIA as qualified to perform high complexity clinical laboratory testing.    OXACILLIN Value in next row Resistant      <=4 SENSITIVEThis is a modified FDA-approved test that has been validated and its performance characteristics determined by the reporting laboratory.  This laboratory is certified under the Clinical Laboratory Improvement Amendments CLIA as qualified to perform high complexity clinical laboratory testing.    TETRACYCLINE Value in next row Resistant      <=4 SENSITIVEThis is a modified FDA-approved test that has been validated and its performance characteristics determined by the reporting laboratory.  This laboratory is certified under the Clinical Laboratory Improvement Amendments CLIA as qualified to perform high complexity clinical laboratory testing.    VANCOMYCIN  Value in next row Sensitive      <=4 SENSITIVEThis is a modified FDA-approved test that has been validated and its performance characteristics determined by the reporting laboratory.  This laboratory is certified under the Clinical Laboratory Improvement Amendments CLIA as qualified to perform high complexity clinical laboratory testing.    TRIMETH /SULFA  Value in next row Sensitive      <=4 SENSITIVEThis is a modified FDA-approved test that has been validated and its performance characteristics determined by the reporting laboratory.  This laboratory is certified under the Clinical Laboratory Improvement Amendments CLIA as qualified to perform high complexity clinical laboratory testing.    CLINDAMYCIN Value in next row Resistant      <=4 SENSITIVEThis is a modified FDA-approved test that has been validated and its performance characteristics determined by the reporting laboratory.  This laboratory is certified under the Clinical Laboratory Improvement  Amendments CLIA as qualified to perform high complexity clinical laboratory testing.    RIFAMPIN Value in next row Sensitive      <=4 SENSITIVEThis is a modified FDA-approved test that has been validated and its performance characteristics determined by the reporting laboratory.  This laboratory is certified under the Clinical Laboratory Improvement Amendments CLIA as qualified to perform high complexity clinical laboratory testing.    Inducible Clindamycin Value in next row Sensitive      <=4 SENSITIVEThis is a modified FDA-approved test that has been validated and its performance characteristics determined by the reporting laboratory.  This laboratory is certified under the Clinical Laboratory Improvement Amendments CLIA as qualified to perform high complexity clinical laboratory testing.    * RARE STAPHYLOCOCCUS SIMULANS  Aerobic/Anaerobic Culture w Gram Stain (surgical/deep wound)     Status: None (Preliminary result)   Collection Time: 02/17/24  2:06 PM   Specimen: Bone; Tissue  Result Value Ref Range Status   Specimen Description BONE  Final   Special Requests RIGHT HEEL BONE  Final   Gram Stain NO WBC SEEN NO ORGANISMS SEEN   Final   Culture   Final    RARE STAPHYLOCOCCUS SIMULANS SUSCEPTIBILITIES PERFORMED ON PREVIOUS CULTURE WITHIN THE LAST 5 DAYS. NO ANAEROBES ISOLATED Performed at [facility], [address] [place], [place] [address]    Report Status PENDING  Incomplete     Radiology Studies: No results found.  Scheduled Meds:  busPIRone   5 mg Oral BID   clopidogrel   75 mg Oral Daily   enoxaparin  (LOVENOX ) injection  50 mg Subcutaneous Q24H   gabapentin   300 mg Oral QHS   insulin  aspart  0-20 Units Subcutaneous TID WC   insulin  aspart  0-5 Units Subcutaneous QHS   insulin  aspart  3 Units Subcutaneous TID WC   insulin  glargine  40 Units Subcutaneous QHS   isosorbide  mononitrate  30 mg Oral Daily   metoprolol  tartrate  12.5 mg Oral BID   pantoprazole   40 mg  Oral Daily   rosuvastatin   40 mg Oral QHS   torsemide   40 mg Oral Daily   Continuous Infusions:  linezolid  (ZYVOX ) IV 600 mg (02/22/24 2148)   piperacillin -tazobactam (ZOSYN )  IV 3.375 g (02/23/24 0526)     Unresulted Labs (From admission, onward)     Start     Ordered   02/22/24 0500  Creatinine, serum  (enoxaparin  (LOVENOX )    CrCl >/= 30 ml/min)  Weekly,   R     Comments: while on enoxaparin  therapy    02/15/24 0741   Unscheduled  Basic metabolic panel with GFR  Tomorrow morning,   R       Question:  Specimen collection method  Answer:  Lab=Lab collect   02/23/24 2152             LOS:  LOS: 8 days   Time Spent: 55 minutes  Patrece Tallie Al-Sultani, MD [facility]  If 7PM-7AM, please contact night-coverage  02/23/2024, 9:15 AM      "

## 2024-02-24 ENCOUNTER — Ambulatory Visit: Admitting: Student

## 2024-02-24 ENCOUNTER — Other Ambulatory Visit: Payer: Self-pay

## 2024-02-24 ENCOUNTER — Encounter: Payer: Self-pay | Admitting: Podiatry

## 2024-02-24 ENCOUNTER — Other Ambulatory Visit (HOSPITAL_COMMUNITY): Payer: Self-pay

## 2024-02-24 ENCOUNTER — Emergency Department (HOSPITAL_COMMUNITY)
Admission: EM | Admit: 2024-02-24 | Discharge: 2024-02-25 | Disposition: A | Discharge status: Home / Self Care | Attending: Emergency Medicine | Admitting: Emergency Medicine

## 2024-02-24 ENCOUNTER — Encounter (HOSPITAL_COMMUNITY): Payer: Self-pay

## 2024-02-24 DIAGNOSIS — Z4800 Encounter for change or removal of nonsurgical wound dressing: Secondary | ICD-10-CM | POA: Diagnosis present

## 2024-02-24 DIAGNOSIS — E119 Type 2 diabetes mellitus without complications: Secondary | ICD-10-CM | POA: Diagnosis not present

## 2024-02-24 DIAGNOSIS — Z5189 Encounter for other specified aftercare: Secondary | ICD-10-CM

## 2024-02-24 DIAGNOSIS — Z794 Long term (current) use of insulin: Secondary | ICD-10-CM | POA: Diagnosis not present

## 2024-02-24 LAB — BASIC METABOLIC PANEL WITH GFR
Anion gap: 9 (ref 5–15)
BUN: 27 mg/dL — ABNORMAL HIGH (ref 6–20)
CO2: 32 mmol/L (ref 22–32)
Calcium: 9.4 mg/dL (ref 8.9–10.3)
Chloride: 97 mmol/L — ABNORMAL LOW (ref 98–111)
Creatinine, Ser: 0.97 mg/dL (ref 0.44–1.00)
GFR, Estimated: >60 mL/min
Glucose, Bld: 147 mg/dL — ABNORMAL HIGH (ref 70–99)
Potassium: 3.7 mmol/L (ref 3.5–5.1)
Sodium: 138 mmol/L (ref 135–145)

## 2024-02-24 LAB — GLUCOSE, CAPILLARY: Glucose-Capillary: 108 mg/dL — ABNORMAL HIGH (ref 70–99)

## 2024-02-24 MED ORDER — OXYCODONE HCL 5 MG PO TABS
5.0000 mg | ORAL_TABLET | Freq: Four times a day (QID) | ORAL | 0 refills | Status: AC | PRN
  Filled 2024-02-24: qty 20, 5d supply, fill #0

## 2024-02-24 MED ORDER — LEVOFLOXACIN 750 MG PO TABS
750.0000 mg | ORAL_TABLET | Freq: Every day | ORAL | 0 refills | Status: AC
  Filled 2024-02-24: qty 20, 20d supply, fill #0

## 2024-02-24 MED ORDER — SULFAMETHOXAZOLE-TRIMETHOPRIM 800-160 MG PO TABS
1.0000 | ORAL_TABLET | Freq: Two times a day (BID) | ORAL | 0 refills | Status: AC
  Filled 2024-02-24: qty 24, 12d supply, fill #0

## 2024-02-24 MED ORDER — LINEZOLID 600 MG PO TABS
600.0000 mg | ORAL_TABLET | Freq: Two times a day (BID) | ORAL | 0 refills | Status: AC
  Filled 2024-02-24: qty 15, 8d supply, fill #0

## 2024-02-24 NOTE — TOC Transition Note (Addendum)
 Transition of Care [facility]) - Discharge Note   Patient Details  Name: Nicole [name] MRN: [id number] Date of Birth: 06/02/1965  Transition of Care [facility]) CM/SW Contact:  Roxie KANDICE Stain, RN Phone Number: 02/24/2024, 10:22 AM   Clinical Narrative:    Patient stable for discharge.  Wound vac delivered to patient's room.  Daughter to learn dressing change, bedside RN to teach. Notified Artavia with [facility] of need for wound vac to be applied on Monday.  Daughter to transport home.   Final next level of care: Home w Home Health Services Barriers to Discharge: Barriers Resolved   Patient Goals and CMS Choice Patient states their goals for this hospitalization and ongoing recovery are:: return home CMS Medicare.gov Compare Post Acute Care list provided to:: Patient Choice offered to / list presented to : Patient      Discharge Placement                 Home      Discharge Plan and Services Additional resources added to the After Visit Summary for                  DME Arranged: Vac DME Agency: [employer] Date DME Agency Contacted: 02/24/24 Time DME Agency Contacted: [phone] Representative spoke with at DME Agency: [facility] HH Arranged: RN, PT [facility] Agency: [facility] Date HH Agency Contacted: 02/24/24 Time HH Agency Contacted: 1021 Representative spoke with at [facility] Agency: [facility]  Social Drivers of Health (SDOH) Interventions SDOH Screenings   Food Insecurity: No Food Insecurity (02/15/2024)  Housing: Low Risk (02/15/2024)  Transportation Needs: No Transportation Needs (02/15/2024)  Utilities: Not At Risk (02/15/2024)  Tobacco Use: Medium Risk (02/17/2024)     Readmission Risk Interventions    11/15/2023   11:27 AM  Readmission Risk Prevention Plan  Transportation Screening Complete  PCP or Specialist Appt within 5-7 Days Complete  Home Care Screening Complete  Medication Review (RN CM) Complete

## 2024-02-24 NOTE — Plan of Care (Signed)

## 2024-02-24 NOTE — Discharge Summary (Signed)
 " Physician Discharge Summary   Patient: Nicole [name] MRN: [id number] DOB: [birth year]  Admit date:     02/14/2024  Discharge date: 02/24/2024  Discharge Physician: Duffy Al-Sultani   PCP: Shona Norleen PEDLAR, MD   Recommendations at discharge:   Follow up with PCP within 1-2 weeks of discharge, repeat CBC and CMP Follow up with podiatry as scheduled for post-op wound check  Discharge Diagnoses: Principal Problem:   Non-healing open wound of right heel Active Problems:   Primary hypertension   Hx of BKA, left (HCC)   Uncontrolled type 2 diabetes mellitus with hyperglycemia, with long-term current use of insulin  (HCC)   Diabetic foot ulcer (HCC)   OSA on CPAP   Peripheral artery disease  Resolved Problems:   * No resolved hospital problems. [facility] Course: Patient is a 59 year old female with known history of hypertension hyperlipidemia GERD CAD diabetes type 2, peripheral arterial disease status post left BKA, COPD, sleep apnea noncompliant with CPAP and A-fib not currently on anticoagulation. She presented to the ED with worsening right heel wound followed outpatient podiatry where she has been undergoing  local wound debridement with no improvement.  Of note she was recently admitted to the hospital from 9/17 to 9/23 for the same infection, at which time vascular surgery was consulted and recommended BKA, but the patient refused surgery at that time.   She underwent debridement of the right heel with partial calcaneal removal on 12/26 by podiatry. Wound vac was placed post debridement.   # Chronic diabetic foot ulcer with acute superimposed cellulitis, POA # Polymicrobial growth on wound culture # Peripheral artery disease, chronic, moderate to severe - Underwent debridement of right heel; partial calcaneal removal, implantation of antibiotic beads on 12/26 - Wound culture positive for Proteus mirabilis, staphylococcus simulans, and Pseudomonas aeruginoa - NWB, only to use foot  for transfer as tolerated, per podiatry - Wound vac removed by WOC RN on 12/30 after it was noted to be malfunctioning. Was discharged with [facility] with wound vac to be placed back on Monday. Until then dressing to remain c/d/I.  - Continue Plavix , rosuvastatin  - Received IV Zosyn  and Linezolid  while inpatient. Transitioned to PO antibiotics: - PO Levaquin  750 mg qd for 20 days - PO Linezolid  x 8 days followed by PO Bactrim  for 12 days  - Podiatry to schedule follow up in 2 weeks    Uncontrolled type 2 diabetes mellitus with hyperglycemia - Hgb A1c 9.2 (previously 13) - improved but still markedly uncontrolled - Discharged on home regimen - Follow up with PCP for continued management    At the time of discharge, the patient was hemodynamically stable with improved symptoms, clinically appropriate for discharge, and in no acute distress. The discharge plan, follow-up instructions, return precautions, and medication changes were reviewed in detail with the patient and her daughter at bedside. The patient and her daugter verbalized understanding, was in agreement with the plan, and had all questions answered prior to leaving the hospital.      Consultants: Podiatry Disposition: Home health Diet recommendation:  Diet Orders (From admission, onward)     Start     Ordered   02/17/24 1555  Diet Carb Modified Room service appropriate? Yes  Diet effective now       Question Answer Comment  Calorie Level Medium 1600-2000   Fluid consistency: Thin   Room service appropriate? Yes      02/17/24 1554  DISCHARGE MEDICATION: Allergies as of 02/24/2024       Reactions   Aspirin  Anaphylaxis, Other (See Comments)   Throat closing    Bee Venom Anaphylaxis   Pineapple Anaphylaxis   Definity  [perflutren  Lipid Microsphere]    Muscle Aches   E-mycin [erythromycin Base] Nausea And Vomiting        Medication List     STOP taking these medications    amoxicillin -clavulanate  875-125 MG tablet Commonly known as: AUGMENTIN    HYDROcodone -acetaminophen  5-325 MG tablet Commonly known as: NORCO/VICODIN       TAKE these medications    Accu-Chek Guide Test test strip Generic drug: glucose blood Use as instructed to monitor glucose 4 times daily- use as back up for Dexcom   Accu-Chek Softclix Lancets lancets Use as instructed to monitor glucose 4 times daily- use as backup to Dexcom   acetaminophen  500 MG tablet Commonly known as: TYLENOL  Take 1,000 mg by mouth as needed for mild pain (pain score 1-3) or moderate pain (pain score 4-6).   albuterol  108 (90 Base) MCG/ACT inhaler Commonly known as: VENTOLIN  HFA Inhale 2 puffs into the lungs every 6 (six) hours as needed for wheezing. Shortness of breath   B-D ULTRAFINE III SHORT PEN 31G X 8 MM Misc Generic drug: Insulin  Pen Needle USE AS DIRECTED   busPIRone  5 MG tablet Commonly known as: BUSPAR  Take 5 mg by mouth 2 (two) times daily.   Cholecalciferol  1.25 MG (50000 UT) capsule Take 50,000 Units by mouth once a week. On Sunday   clopidogrel  75 MG tablet Commonly known as: PLAVIX  Take 1 tablet by mouth once daily   EPINEPHrine  0.3 mg/0.3 mL Soaj injection Commonly known as: EPI-PEN Inject 0.3 mg into the muscle as needed for anaphylaxis.   HumuLIN  R U-500 KwikPen 500 UNIT/ML KwikPen Generic drug: insulin  regular human CONCENTRATED Inject 60 Units into the skin 3 (three) times daily with meals.   Ibuprofen 200 MG Caps Take 2 capsules by mouth as needed.   isosorbide  mononitrate 30 MG 24 hr tablet Commonly known as: IMDUR  Take 1 tablet (30 mg total) by mouth daily.   levocetirizine 5 MG tablet Commonly known as: XYZAL  Take 5 mg by mouth daily.   levofloxacin  750 MG tablet Commonly known as: Levaquin  Take 1 tablet (750 mg total) by mouth daily for 20 days.   linezolid  600 MG tablet Commonly known as: ZYVOX  Take 1 tablet (600 mg total) by mouth 2 (two) times daily for 8 days.    Melatonin 10 MG Tabs Take 20 mg by mouth at bedtime as needed.   metoprolol  tartrate 25 MG tablet Commonly known as: LOPRESSOR  Take 0.5 tablets (12.5 mg total) by mouth 2 (two) times daily.   nitroGLYCERIN  0.4 MG SL tablet Commonly known as: NITROSTAT  DISSOLVE ONE TABLET UNDER THE TONGUE EVERY 5 MINUTES AS NEEDED FOR CHEST PAIN.  DO NOT EXCEED A TOTAL OF 3 DOSES IN 15 MINUTES   nystatin  powder Commonly known as: nystatin  Apply 1 Application topically 3 (three) times daily.   oxyCODONE  5 MG immediate release tablet Commonly known as: Oxy IR/ROXICODONE  Take 1 tablet (5 mg total) by mouth every 6 (six) hours as needed for up to 5 days for moderate pain (pain score 4-6) or severe pain (pain score 7-10).   pantoprazole  40 MG tablet Commonly known as: PROTONIX  Take 1 tablet (40 mg total) by mouth daily.   rosuvastatin  40 MG tablet Commonly known as: CRESTOR  Take 40 mg by mouth at bedtime.  senna-docusate 8.6-50 MG tablet Commonly known as: Senokot-S Take 1-2 tablets by mouth 2 (two) times daily between meals as needed for mild constipation or moderate constipation.   sulfamethoxazole -trimethoprim  800-160 MG tablet Commonly known as: BACTRIM  DS Take 1 tablet by mouth 2 (two) times daily for 12 days. Start after linezolid  complete Start taking on: March 03, 2024   torsemide  20 MG tablet Commonly known as: DEMADEX  Take 2 tablets by mouth once daily   Vraylar  3 MG capsule Generic drug: cariprazine  Take 3 mg by mouth daily. What changed: how much to take               Discharge Care Instructions  (From admission, onward)           Start     Ordered   02/24/24 0000  Discharge wound care:       Comments: Negative pressure wound therapy  Every Mon-Wed-Fri 1000    Comments: WOC to change M/W/F (week of 02/20/24); non adherent between foam and wound bed Amount of suction = 125 mm/Hg   Suction Type = Continuous   02/24/24 1040            Contact  information for follow-up providers     [facility] Follow up.   Why: Call for Wound Vac needs. Contact information: [phone]             Contact information for after-discharge care     Home Medical Care     [facility]) .   Service: Home Health Services Why: Someone will call you to scheule first home visit. Contact information: [address] [place]  [address] [phone]                     Discharge Exam: Filed Weights   02/14/24 2224 02/17/24 1258  Weight: 94.8 kg 94.8 kg   Blood pressure (!) 152/57, pulse 63, temperature 97.7 F (36.5 C), temperature source Oral, resp. rate 16, height 5' 3 (1.6 m), weight 94.8 kg, SpO2 97%.   Gen: NAD, A&Ox3 HEENT: NCAT, EOMI Neck: Supple, no JVD, no LAD CV: RRR, no murmurs Resp: normal WOB, CTAB, no w/r/r Abd: Soft, NTND, no guarding, BS normoactive Ext: Left BKA with prosthetic in place, RLE in boot Skin: Warm, dry, no rashes/lesions Neuro: No focal deficits Psych: Calm, cooperative, appropriate affect    Condition at discharge: good  The results of significant diagnostics from this hospitalization (including imaging, microbiology, ancillary and laboratory) are listed below for reference.   Imaging Studies: DG Foot Complete Right Result Date: 02/17/2024 CLINICAL DATA:  Postop.  Nonhealing wound. EXAM: RIGHT FOOT COMPLETE - 3+ VIEW COMPARISON:  01/16/2024 FINDINGS: A wound VAC overlies the heel posterior and inferior to the calcaneus. Probable additional overlying dressing in place. The question cortical irregularity of the calcaneus is not well demonstrated on the current exam due to overlying artifact. Hammertoe deformity of the toes. No acute fracture. There is generalized soft tissue edema. IMPRESSION: Wound VAC overlies the heel posterior and inferior to the calcaneus. The questioned cortical irregularity of the calcaneus on prior is not well  demonstrated on the current exam due to overlying artifact. Electronically Signed   By: Andrea Gasman M.D.   On: 02/17/2024 18:48   DG MINI C-ARM IMAGE ONLY Result Date: 02/17/2024 There is no interpretation for this exam.  This order is for images obtained during a surgical procedure.  Please See Surgeries Tab for more  information regarding the procedure.   MR FOOT RIGHT W WO CONTRAST Result Date: 02/15/2024 CLINICAL DATA:  Heel wound. Concern for osteomyelitis. History of diabetes. EXAM: MRI OF THE RIGHT FOREFOOT WITHOUT AND WITH CONTRAST TECHNIQUE: Multiplanar, multisequence MR imaging of the right forefoot was performed before and after the administration of intravenous contrast. CONTRAST:  10mL GADAVIST  GADOBUTROL  1 MMOL/ML IV SOLN COMPARISON:  Right foot radiographs dated 02/15/2024. MRI of the right foot dated 11/10/2023. FINDINGS: Bones/Joint/Cartilage Soft tissue wound at the medial heel tracks deep to the level of the calcaneal tuberosity undersurface which demonstrates cortical indistinctness with T2 hyperintense marrow signal abnormality and corresponding T1 hypointensity and enhancement on postcontrast sequences, compatible with osteomyelitis. No fracture or dislocation. Mild-to-moderate degenerative arthropathy of the hindfoot and midfoot. No joint effusion. Ligaments Lateral and medial ankle ligaments are intact. Muscles and Tendons Flexor, peroneal and extensor compartment tendons are intact. Chronic thickening of the central cord of the plantar fascia. Slightly increased thickening of the distal Achilles tendon may reflect tendinosis. Calcaneal enthesopathy at the insertion of the Achilles tendon and the origin of the central cord of the plantar fascia. Soft tissue Soft tissue wound at the medial heel with surrounding soft tissue edema, enhancement and cutaneous thickening, compatible with cellulitis. No loculated fluid collection. IMPRESSION: 1. Soft tissue wound at the medial heel  extends deep to the underlying posterior calcaneal tuberosity with associated cortical distinctness and marrow signal abnormality compatible with osteomyelitis. Associated surrounding cellulitis. No loculated fluid collection. 2. Slightly increased thickening of the distal Achilles tendon may reflect tendinosis. 3. Chronic thickening of the central cord of the plantar fascia. Electronically Signed   By: Harrietta Sherry M.D.   On: 02/15/2024 12:02   DG Foot 2 Views Right Result Date: 02/15/2024 CLINICAL DATA:  Heel wound. EXAM: RIGHT FOOT - 2 VIEW COMPARISON:  01/08/2024 FINDINGS: Skin defect posterior to the calcaneus. No tracking soft tissue gas. Question of mild periosteal thickening subjacent ulcer, not seen on prior. No frank bony destruction. Again seen plantar calcaneal spur and Achilles tendon enthesophyte. Hammertoe deformity of the toes. Midfoot degenerative spurring. IMPRESSION: Skin defect posterior to the calcaneus. Question of mild periosteal thickening subjacent to the ulcer, not seen on prior. No frank bony destruction. Electronically Signed   By: Andrea Gasman M.D.   On: 02/15/2024 00:49   DG Foot Complete Right Result Date: 02/02/2024 Please see detailed radiograph report in office note.   Microbiology: Results for orders placed or performed during the hospital encounter of 02/14/24  Blood Culture (routine x 2)     Status: None   Collection Time: 02/15/24  1:08 AM   Specimen: BLOOD  Result Value Ref Range Status   Specimen Description BLOOD BLOOD LEFT ARM  Final   Special Requests   Final    BOTTLES DRAWN AEROBIC AND ANAEROBIC Blood Culture adequate volume   Culture   Final    NO GROWTH 5 DAYS Performed at [facility], [address], [place], [place] [address]    Report Status 02/20/2024 FINAL  Final  Blood Culture (routine x 2)     Status: None   Collection Time: 02/15/24  1:11 AM   Specimen: BLOOD  Result Value Ref Range Status   Specimen Description BLOOD BLOOD  RIGHT ARM  Final   Special Requests AEROBIC BOTTLE ONLY Blood Culture adequate volume  Final   Culture   Final    NO GROWTH 5 DAYS Performed at [facility], [address], [place], [place] [address]  Report Status 02/20/2024 FINAL  Final  Aerobic/Anaerobic Culture w Gram Stain (surgical/deep wound)     Status: None   Collection Time: 02/17/24  2:05 PM   Specimen: Soft Tissue, Other  Result Value Ref Range Status   Specimen Description TISSUE  Final   Special Requests RIGHT HEEL TISSUE  Final   Gram Stain NO WBC SEEN RARE GRAM POSITIVE COCCI IN PAIRS   Final   Culture   Final    MODERATE PROTEUS MIRABILIS RARE STAPHYLOCOCCUS SIMULANS RARE PSEUDOMONAS AERUGINOSA NO ANAEROBES ISOLATED Performed at [facility], [address] [place], [place] [address]    Report Status 02/21/2024 FINAL  Final   Organism ID, Bacteria PROTEUS MIRABILIS  Final   Organism ID, Bacteria STAPHYLOCOCCUS SIMULANS  Final   Organism ID, Bacteria PSEUDOMONAS AERUGINOSA  Final      Susceptibility   Pseudomonas aeruginosa - MIC*    MEROPENEM 1 SENSITIVE Sensitive     CIPROFLOXACIN 0.25 SENSITIVE Sensitive     IMIPENEM 2 SENSITIVE Sensitive     PIP/TAZO Value in next row Sensitive      <=4 SENSITIVEThis is a modified FDA-approved test that has been validated and its performance characteristics determined by the reporting laboratory.  This laboratory is certified under the Clinical Laboratory Improvement Amendments CLIA as qualified to perform high complexity clinical laboratory testing.    CEFEPIME  Value in next row Sensitive      <=4 SENSITIVEThis is a modified FDA-approved test that has been validated and its performance characteristics determined by the reporting laboratory.  This laboratory is certified under the Clinical Laboratory Improvement Amendments CLIA as qualified to perform high complexity clinical laboratory testing.    CEFTAZIDIME/AVIBACTAM Value in next row Sensitive      <=4  SENSITIVEThis is a modified FDA-approved test that has been validated and its performance characteristics determined by the reporting laboratory.  This laboratory is certified under the Clinical Laboratory Improvement Amendments CLIA as qualified to perform high complexity clinical laboratory testing.    CEFTOLOZANE/TAZOBACTAM Value in next row Sensitive      <=4 SENSITIVEThis is a modified FDA-approved test that has been validated and its performance characteristics determined by the reporting laboratory.  This laboratory is certified under the Clinical Laboratory Improvement Amendments CLIA as qualified to perform high complexity clinical laboratory testing.    TOBRAMYCIN  Value in next row Sensitive      <=4 SENSITIVEThis is a modified FDA-approved test that has been validated and its performance characteristics determined by the reporting laboratory.  This laboratory is certified under the Clinical Laboratory Improvement Amendments CLIA as qualified to perform high complexity clinical laboratory testing.    CEFTAZIDIME Value in next row Sensitive      <=4 SENSITIVEThis is a modified FDA-approved test that has been validated and its performance characteristics determined by the reporting laboratory.  This laboratory is certified under the Clinical Laboratory Improvement Amendments CLIA as qualified to perform high complexity clinical laboratory testing.    * RARE PSEUDOMONAS AERUGINOSA   Proteus mirabilis - MIC*    AMPICILLIN  Value in next row Sensitive      <=4 SENSITIVEThis is a modified FDA-approved test that has been validated and its performance characteristics determined by the reporting laboratory.  This laboratory is certified under the Clinical Laboratory Improvement Amendments CLIA as qualified to perform high complexity clinical laboratory testing.    CEFAZOLIN  (NON-URINE) Value in next row Intermediate      <=4 SENSITIVEThis is a modified FDA-approved test that  has been validated and its  performance characteristics determined by the reporting laboratory.  This laboratory is certified under the Clinical Laboratory Improvement Amendments CLIA as qualified to perform high complexity clinical laboratory testing.    CEFEPIME  Value in next row Sensitive      <=4 SENSITIVEThis is a modified FDA-approved test that has been validated and its performance characteristics determined by the reporting laboratory.  This laboratory is certified under the Clinical Laboratory Improvement Amendments CLIA as qualified to perform high complexity clinical laboratory testing.    ERTAPENEM Value in next row Sensitive      <=4 SENSITIVEThis is a modified FDA-approved test that has been validated and its performance characteristics determined by the reporting laboratory.  This laboratory is certified under the Clinical Laboratory Improvement Amendments CLIA as qualified to perform high complexity clinical laboratory testing.    CEFTRIAXONE  Value in next row Sensitive      <=4 SENSITIVEThis is a modified FDA-approved test that has been validated and its performance characteristics determined by the reporting laboratory.  This laboratory is certified under the Clinical Laboratory Improvement Amendments CLIA as qualified to perform high complexity clinical laboratory testing.    CIPROFLOXACIN Value in next row Sensitive      <=4 SENSITIVEThis is a modified FDA-approved test that has been validated and its performance characteristics determined by the reporting laboratory.  This laboratory is certified under the Clinical Laboratory Improvement Amendments CLIA as qualified to perform high complexity clinical laboratory testing.    GENTAMICIN  Value in next row Sensitive      <=4 SENSITIVEThis is a modified FDA-approved test that has been validated and its performance characteristics determined by the reporting laboratory.  This laboratory is certified under the Clinical Laboratory Improvement Amendments CLIA as qualified  to perform high complexity clinical laboratory testing.    MEROPENEM Value in next row Sensitive      <=4 SENSITIVEThis is a modified FDA-approved test that has been validated and its performance characteristics determined by the reporting laboratory.  This laboratory is certified under the Clinical Laboratory Improvement Amendments CLIA as qualified to perform high complexity clinical laboratory testing.    TRIMETH /SULFA  Value in next row Sensitive      <=4 SENSITIVEThis is a modified FDA-approved test that has been validated and its performance characteristics determined by the reporting laboratory.  This laboratory is certified under the Clinical Laboratory Improvement Amendments CLIA as qualified to perform high complexity clinical laboratory testing.    AMPICILLIN /SULBACTAM Value in next row Sensitive      <=4 SENSITIVEThis is a modified FDA-approved test that has been validated and its performance characteristics determined by the reporting laboratory.  This laboratory is certified under the Clinical Laboratory Improvement Amendments CLIA as qualified to perform high complexity clinical laboratory testing.    PIP/TAZO Value in next row Sensitive      <=4 SENSITIVEThis is a modified FDA-approved test that has been validated and its performance characteristics determined by the reporting laboratory.  This laboratory is certified under the Clinical Laboratory Improvement Amendments CLIA as qualified to perform high complexity clinical laboratory testing.    * MODERATE PROTEUS MIRABILIS   Staphylococcus simulans - MIC*    CIPROFLOXACIN Value in next row Resistant      <=4 SENSITIVEThis is a modified FDA-approved test that has been validated and its performance characteristics determined by the reporting laboratory.  This laboratory is certified under the Clinical Laboratory Improvement Amendments CLIA as qualified to perform high complexity clinical laboratory testing.  ERYTHROMYCIN Value in next  row Resistant      <=4 SENSITIVEThis is a modified FDA-approved test that has been validated and its performance characteristics determined by the reporting laboratory.  This laboratory is certified under the Clinical Laboratory Improvement Amendments CLIA as qualified to perform high complexity clinical laboratory testing.    GENTAMICIN  Value in next row Sensitive      <=4 SENSITIVEThis is a modified FDA-approved test that has been validated and its performance characteristics determined by the reporting laboratory.  This laboratory is certified under the Clinical Laboratory Improvement Amendments CLIA as qualified to perform high complexity clinical laboratory testing.    OXACILLIN Value in next row Resistant      <=4 SENSITIVEThis is a modified FDA-approved test that has been validated and its performance characteristics determined by the reporting laboratory.  This laboratory is certified under the Clinical Laboratory Improvement Amendments CLIA as qualified to perform high complexity clinical laboratory testing.    TETRACYCLINE Value in next row Resistant      <=4 SENSITIVEThis is a modified FDA-approved test that has been validated and its performance characteristics determined by the reporting laboratory.  This laboratory is certified under the Clinical Laboratory Improvement Amendments CLIA as qualified to perform high complexity clinical laboratory testing.    VANCOMYCIN  Value in next row Sensitive      <=4 SENSITIVEThis is a modified FDA-approved test that has been validated and its performance characteristics determined by the reporting laboratory.  This laboratory is certified under the Clinical Laboratory Improvement Amendments CLIA as qualified to perform high complexity clinical laboratory testing.    TRIMETH /SULFA  Value in next row Sensitive      <=4 SENSITIVEThis is a modified FDA-approved test that has been validated and its performance characteristics determined by the reporting  laboratory.  This laboratory is certified under the Clinical Laboratory Improvement Amendments CLIA as qualified to perform high complexity clinical laboratory testing.    CLINDAMYCIN Value in next row Resistant      <=4 SENSITIVEThis is a modified FDA-approved test that has been validated and its performance characteristics determined by the reporting laboratory.  This laboratory is certified under the Clinical Laboratory Improvement Amendments CLIA as qualified to perform high complexity clinical laboratory testing.    RIFAMPIN Value in next row Sensitive      <=4 SENSITIVEThis is a modified FDA-approved test that has been validated and its performance characteristics determined by the reporting laboratory.  This laboratory is certified under the Clinical Laboratory Improvement Amendments CLIA as qualified to perform high complexity clinical laboratory testing.    Inducible Clindamycin Value in next row Sensitive      <=4 SENSITIVEThis is a modified FDA-approved test that has been validated and its performance characteristics determined by the reporting laboratory.  This laboratory is certified under the Clinical Laboratory Improvement Amendments CLIA as qualified to perform high complexity clinical laboratory testing.    * RARE STAPHYLOCOCCUS SIMULANS  Aerobic/Anaerobic Culture w Gram Stain (surgical/deep wound)     Status: None   Collection Time: 02/17/24  2:06 PM   Specimen: Bone; Tissue  Result Value Ref Range Status   Specimen Description BONE  Final   Special Requests RIGHT HEEL BONE  Final   Gram Stain NO WBC SEEN NO ORGANISMS SEEN   Final   Culture   Final    RARE STAPHYLOCOCCUS SIMULANS SUSCEPTIBILITIES PERFORMED ON PREVIOUS CULTURE WITHIN THE LAST 5 DAYS. NO ANAEROBES ISOLATED Performed at [facility], [address] [place],  [place] [address]    Report Status 02/23/2024 FINAL  Final    Labs: CBC: Recent Labs  Lab 02/19/24 0335 02/21/24 0439 02/23/24 0659  WBC 8.3  7.8 5.4  HGB 10.0* 9.6* 9.7*  HCT 30.3* 29.9* 29.5*  MCV 90.4 92.3 91.3  PLT 150 138* 141*   Basic Metabolic Panel: Recent Labs  Lab 02/19/24 0335 02/21/24 0439 02/22/24 0635 02/23/24 0659 02/24/24 0419  NA 136 137  --  140 138  K 4.3 4.2  --  3.9 3.7  CL 98 99  --  99 97*  CO2 29 29  --  32 32  GLUCOSE 249* 261*  --  180* 147*  BUN 16 20  --  25* 27*  CREATININE 0.80 0.87 0.96 1.08* 0.97  CALCIUM  9.1 9.2  --  9.3 9.4   Liver Function Tests: No results for input(s): AST, ALT, ALKPHOS, BILITOT, PROT, ALBUMIN in the last 168 hours. CBG: Recent Labs  Lab 02/23/24 0844 02/23/24 1059 02/23/24 1704 02/23/24 2009 02/24/24 0736  GLUCAP 184* 259* 166* 136* 108*    Discharge time spent: Time Coordinating Discharge: I spent a total of 35 minutes engaged in face-to-face discussion with the patient and/or caregivers regarding the patients care, assessment, plan, and discharge disposition. Over 50% of this time was dedicated to counseling the patient on the risks and benefits of treatment options and the discharge plan, as well as coordinating post-discharge care.   Signed: Amar Keenum Al-Sultani, MD [facility] 02/24/2024         "

## 2024-02-24 NOTE — ED Triage Notes (Signed)
 Pt POV- released from [facility] today for wound on right heel. Pt family states that once pt got home, they noticed that her heel was bleeding through the bandages and was replaced but states still bleeding. Instructed to come here.

## 2024-02-24 NOTE — Plan of Care (Signed)
" °  Problem: Education: Goal: Knowledge of General Education information will improve Description: Including pain rating scale, medication(s)/side effects and non-pharmacologic comfort measures Outcome: Adequate for Discharge   Problem: Health Behavior/Discharge Planning: Goal: Ability to manage health-related needs will improve Outcome: Adequate for Discharge   Problem: Clinical Measurements: Goal: Ability to maintain clinical measurements within normal limits will improve Outcome: Adequate for Discharge Goal: Will remain free from infection Outcome: Adequate for Discharge Goal: Diagnostic test results will improve Outcome: Adequate for Discharge Goal: Respiratory complications will improve Outcome: Adequate for Discharge Goal: Cardiovascular complication will be avoided Outcome: Adequate for Discharge   Problem: Activity: Goal: Risk for activity intolerance will decrease Outcome: Adequate for Discharge   Problem: Nutrition: Goal: Adequate nutrition will be maintained Outcome: Adequate for Discharge   Problem: Coping: Goal: Level of anxiety will decrease Outcome: Adequate for Discharge   Problem: Elimination: Goal: Will not experience complications related to bowel motility Outcome: Adequate for Discharge Goal: Will not experience complications related to urinary retention Outcome: Adequate for Discharge   Problem: Pain Managment: Goal: General experience of comfort will improve and/or be controlled Outcome: Adequate for Discharge   Problem: Safety: Goal: Ability to remain free from injury will improve Outcome: Adequate for Discharge   Problem: Skin Integrity: Goal: Risk for impaired skin integrity will decrease Outcome: Adequate for Discharge   Problem: Education: Goal: Ability to describe self-care measures that may prevent or decrease complications (Diabetes Survival Skills Education) will improve Outcome: Adequate for Discharge Goal: Individualized Educational  Video(s) Outcome: Adequate for Discharge   Problem: Coping: Goal: Ability to adjust to condition or change in health will improve Outcome: Adequate for Discharge   Problem: Fluid Volume: Goal: Ability to maintain a balanced intake and output will improve Outcome: Adequate for Discharge   Problem: Health Behavior/Discharge Planning: Goal: Ability to identify and utilize available resources and services will improve Outcome: Adequate for Discharge Goal: Ability to manage health-related needs will improve Outcome: Adequate for Discharge   Problem: Metabolic: Goal: Ability to maintain appropriate glucose levels will improve Outcome: Adequate for Discharge   Problem: Nutritional: Goal: Maintenance of adequate nutrition will improve Outcome: Adequate for Discharge Goal: Progress toward achieving an optimal weight will improve Outcome: Adequate for Discharge   Problem: Skin Integrity: Goal: Risk for impaired skin integrity will decrease Outcome: Adequate for Discharge   Problem: Tissue Perfusion: Goal: Adequacy of tissue perfusion will improve Outcome: Adequate for Discharge   Problem: Acute Rehab PT Goals(only PT should resolve) Goal: Pt Will Go Supine/Side To Sit Outcome: Adequate for Discharge Goal: Patient Will Transfer Sit To/From Stand Outcome: Adequate for Discharge Goal: Pt Will Transfer Bed To Chair/Chair To Bed Outcome: Adequate for Discharge   "

## 2024-02-24 NOTE — Progress Notes (Signed)
"  °  Subjective:  Patient ID: Nicole [name], female    DOB: October 28, 1965,  MRN: [id number]  Chief Complaint  Patient presents with   Wound Check    DOS: 02/17/24 Procedure: 1.  Excisional debridement of ulcer right heel 2.  Partial calcanectomy right  3.  Implantation of antibiotic beads right 4.  Application of negative pressure wound VAC right   With Dr. Janit  59 y.o. female seen for post op check. She reports no pain in right heel. Ready for DC home today. Discussed plans for wound care with dressing applied today until vac goes back on Monday with Home health. She was agreeable. Discussed plans for follow up.   Review of Systems: Negative except as noted in the HPI. Denies N/V/F/Ch.   Objective:   Constitutional Well developed. Well nourished.  Vascular Foot warm and well perfused. Capillary refill normal to all digits.   No calf pain with palpation  Neurologic Normal speech. Oriented to person, place, and time. Epicritic sensation absent R heel  Dermatologic Right heel wound appears less maceration on picture taken with WOCN this AM, per report some bleeding. No erythema surrounding   Orthopedic: Prior L amputation, R heel partial calcanectomy   Radiographs: Wound VAC overlies the heel posterior and inferior to the calcaneus. The questioned cortical irregularity of the calcaneus on prior is not well demonstrated on the current exam due to overlying artifact.    Pathology: None sent  Micro: MODERATE PROTEUS MIRABILIS  RARE STAPHYLOCOCCUS SIMULANS  RARE PSEUDOMONAS AERUGINOSA   Assessment:   1. Infected wound   2. Congestive heart failure, unspecified HF chronicity, unspecified heart failure type (HCC)   3. Chronic obstructive pulmonary disease, unspecified COPD type (HCC)   4. Atherosclerosis of native coronary artery of native heart, unspecified whether angina present   5. HTN (hypertension), benign   6. Dyslipidemia   7. Diabetes mellitus type 2 in obese   8.  Morbid obesity (HCC)   9. Essential hypertension   10. Hyperlipidemia LDL goal <70   11. Type 2 diabetes mellitus in patient with obesity (HCC)   Osteomyelitis and chronic wound R heel s/p R heel debridement, abx beads, vac placement  Plan:  Patient was evaluated and treated and all questions answered.  POD # 7 s/p R heel debridement, beads, vac -Progressing as expected post op, bleeding from partial calcanectomy is to be expected, will decrease with time.  Agree with holding vac until Monday to allow hemostasis and decrease maceration at the vac site. Overall looked improved on picture taken this AM, appreciate WOCN assistance. -XR: Expected changes -WB Status: Minimal WBAT in post op shoe for transfer/ bathroom -Sutures: none present. -Medications/ABX: PO Levaquin  750 mg qd until 1/17 and PO Linezolid  x 1 week followed by PO Bactrim  for 2 weeks to complete 4 weeks of post-op antibiotics.  -Dressing: Remain c/d/I until Monday when home health will apply vac - F/u Plan: Follow up in office in 2 weeks office to arrange, stable for dc from my standpoint. Appreciate hospitalist assistance with discharge        [name], DPM [facility]  "

## 2024-02-24 NOTE — Consult Note (Signed)
 WOC Nurse wound follow up Wound type: surgical wound Measurement: 3.5 cm x 4.0 cm x 2.0 cm  Wound bed: centrally has some bloody material but with probing it does not feel soft; otherwise the peripheral wound bed is yellow/white which is the same as the appearance earlier in the week.  Photos taken; periwound maceration has resolved  Drainage (amount, consistency, odor) bloody on dressing; see photos Periwound:intact  Dressing procedure/placement/frequency: Continue silver hydrofiber dressings over the weekend daily, cover with ABD or dry dressing, wrap with kerlix, and secure with ACE wrap.  HHRN will place NPWT to the heel wound on Monday, that way they can instruct patient on machine and troubleshooting should any issues arise at home  3. TOC, attending, and podiatry aware of plan.   Bedside nursing to teach daughter when she arrives to take patient home to change dressings. Silver hydrofiber in the room, extra ordered for patient (2) pc.  Patient will need kerlix (2-3), silver hydrofiber, ABD pads (4-5), ACE wraps are with the nurse.  All has been taken to the room, except silver which nursing will take when it arrives.    She has CAM boot at bedside.  Explained rationale for holding off on NPWT to patient and her daughter (daughter on speaker phone).    She will have HHRN and HHPT at home, verified with TOC.   Re consult if needed, will not follow at this time. Thanks  Lakeita Panther [employer], RN,CWOCN, CNS, [employer] [phone]

## 2024-02-25 NOTE — Discharge Instructions (Signed)
 Leave dressing in place until home health evaluates you on Monday.  Return to the ER for any new and/or concerning issues.

## 2024-02-25 NOTE — ED Notes (Signed)
 Pt was assisted to the bathroom by wheelchair by this tech. Pt is now back in bed with call light in reach.

## 2024-02-25 NOTE — ED Provider Notes (Signed)
 " [place] EMERGENCY DEPARTMENT AT [facility] Provider Note   CSN: [id number] Arrival date & time: 02/24/24  2154     Patient presents with: Post-op Problem   Nicole [name] is a 59 y.o. female.   Patient is a 59 year old female with history of diabetes and recent admission for a decubitus ulcer of her heel.  She underwent debridement at [facility].  She is having drainage that is saturating the dressing.  The daughter was concerned about this and called the on-call nurse.  She recommended the patient come to the ER to be evaluated.  She was supposed to have a wound VAC in place, however the wound VAC malfunctioned and another is to be placed by home health on Monday.       Prior to Admission medications  Medication Sig Start Date End Date Taking? Authorizing Provider  Accu-Chek Softclix Lancets lancets Use as instructed to monitor glucose 4 times daily- use as backup to Dexcom 03/10/23   Therisa Benton PARAS, NP  acetaminophen  (TYLENOL ) 500 MG tablet Take 1,000 mg by mouth as needed for mild pain (pain score 1-3) or moderate pain (pain score 4-6).    [provider]  albuterol  (VENTOLIN  HFA) 108 (90 Base) MCG/ACT inhaler Inhale 2 puffs into the lungs every 6 (six) hours as needed for wheezing. Shortness of breath 07/30/20   Elnor Lauraine BRAVO, NP  busPIRone  (BUSPAR ) 5 MG tablet Take 5 mg by mouth 2 (two) times daily. 08/08/23   [provider]  Cholecalciferol  1.25 MG (50000 UT) capsule Take 50,000 Units by mouth once a week. On Sunday 09/12/23   [provider]  clopidogrel  (PLAVIX ) 75 MG tablet Take 1 tablet by mouth once daily 11/22/23   Okey Vina GAILS, MD  EPINEPHrine  0.3 mg/0.3 mL IJ SOAJ injection Inject 0.3 mg into the muscle as needed for anaphylaxis. 05/15/20   Elnor Lauraine BRAVO, NP  glucose blood (ACCU-CHEK GUIDE TEST) test strip Use as instructed to monitor glucose 4 times daily- use as back up for Dexcom 03/10/23   Therisa Benton PARAS, NP  Ibuprofen 200 MG  CAPS Take 2 capsules by mouth as needed.    [provider]  Insulin  Pen Needle (B-D ULTRAFINE III SHORT PEN) 31G X 8 MM MISC USE AS DIRECTED 07/06/21   Jaycee Greig PARAS, NP  insulin  regular human CONCENTRATED (HUMULIN  R U-500 KWIKPEN) 500 UNIT/ML KwikPen Inject 60 Units into the skin 3 (three) times daily with meals. 01/02/24   Therisa Benton PARAS, NP  isosorbide  mononitrate (IMDUR ) 30 MG 24 hr tablet Take 1 tablet (30 mg total) by mouth daily. 11/17/23 02/15/24  Strader, Brittany M, PA-C  levocetirizine (XYZAL ) 5 MG tablet Take 5 mg by mouth daily.    [provider]  levofloxacin  (LEVAQUIN ) 750 MG tablet Take 1 tablet (750 mg total) by mouth daily for 20 days. 02/24/24 03/15/24  Al-Sultani, Anmar, MD  linezolid  (ZYVOX ) 600 MG tablet Take 1 tablet (600 mg total) by mouth 2 (two) times daily for 8 days. 02/24/24 03/03/24  Al-Sultani, Anmar, MD  Melatonin 10 MG TABS Take 20 mg by mouth at bedtime as needed.    [provider]  metoprolol  tartrate (LOPRESSOR ) 25 MG tablet Take 0.5 tablets (12.5 mg total) by mouth 2 (two) times daily. 11/17/23 02/15/24  Strader, Laymon [name], PA-C  nitroGLYCERIN  (NITROSTAT ) 0.4 MG SL tablet DISSOLVE ONE TABLET UNDER THE TONGUE EVERY 5 MINUTES AS NEEDED FOR CHEST PAIN.  DO NOT EXCEED A TOTAL  OF 3 DOSES IN 15 MINUTES 09/07/22   Okey Vina GAILS, MD  nystatin  powder Apply 1 Application topically 3 (three) times daily. Patient not taking: Reported on 02/15/2024 08/20/23   Geroldine Berg, MD  oxyCODONE  (OXY IR/ROXICODONE ) 5 MG immediate release tablet Take 1 tablet (5 mg total) by mouth every 6 (six) hours as needed for up to 5 days for moderate pain (pain score 4-6) or severe pain (pain score 7-10). 02/24/24 02/29/24  Al-Sultani, Anmar, MD  pantoprazole  (PROTONIX ) 40 MG tablet Take 1 tablet (40 mg total) by mouth daily. 07/30/20   Elnor Lauraine BRAVO, NP  rosuvastatin  (CRESTOR ) 40 MG tablet Take 40 mg by mouth at bedtime. 04/16/21   [provider]  senna-docusate  (SENOKOT-S) 8.6-50 MG tablet Take 1-2 tablets by mouth 2 (two) times daily between meals as needed for mild constipation or moderate constipation. 11/15/23   Gonfa, Taye T, MD  sulfamethoxazole -trimethoprim  (BACTRIM  DS) 800-160 MG tablet Take 1 tablet by mouth 2 (two) times daily for 12 days. Start after linezolid  complete 03/03/24 03/15/24  Al-Sultani, Anmar, MD  torsemide  (DEMADEX ) 20 MG tablet Take 2 tablets by mouth once daily 09/19/23   Okey Vina GAILS, MD  VRAYLAR  3 MG capsule Take 3 mg by mouth daily. Patient taking differently: Take 1.5 mg by mouth daily. 11/15/21   [provider]    Allergies: Aspirin , Bee venom, Pineapple, Definity  [perflutren  lipid microsphere], and E-mycin [erythromycin base]    Review of Systems  All other systems reviewed and are negative.   Updated Vital Signs BP (!) 152/52 (BP Location: Right Arm)   Pulse 71   Temp 98.3 F (36.8 C) (Oral)   Resp 18   SpO2 98%   Physical Exam Vitals and nursing note reviewed.  Constitutional:      Appearance: Normal appearance.  Pulmonary:     Effort: Pulmonary effort is normal.  Skin:    General: Skin is warm and dry.     Comments: There is a sore noted to the back of the right heel.  This measures approximately 3 cm x 5 cm.  There is no active bleeding and surrounding tissue is pink, but not warm or erythematous.  There is no purulent drainage.  Neurological:     Mental Status: She is alert and oriented to person, place, and time.     (all labs ordered are listed, but only abnormal results are displayed) Labs Reviewed - No data to display  EKG: None  Radiology: No results found.   Procedures   Medications Ordered in the ED - No data to display                                  Medical Decision Making  Patient brought for a wound check of the decubitus sore to her right heel.  The wound itself appears well, but the dressing does have serosanguineous drainage that has saturated the dressing  material.  This was removed and a new dressing will be placed.  Patient to follow-up with home health on Monday.     Final diagnoses:  None    ED Discharge Orders     None          Geroldine Berg, MD 02/25/24 0142  "

## 2024-02-27 ENCOUNTER — Telehealth: Payer: Self-pay | Admitting: Lab

## 2024-02-27 ENCOUNTER — Telehealth: Payer: Self-pay

## 2024-02-27 NOTE — Telephone Encounter (Signed)
 [facility] called verbal order for wound vac 3X weekly stated that she was seen by Dr. Janit and send orders to you per patient request.

## 2024-02-27 NOTE — Transitions of Care (Post Inpatient/ED Visit) (Signed)
 "  02/27/2024  Name: Nicole [name] MRN: [id number] DOB: 05/27/1965  Today's TOC FU Call Status: Today's TOC FU Call Status:: Successful TOC FU Call Completed TOC FU Call Complete Date: 02/27/24  Patient's Name and Date of Birth confirmed. Name, DOB  Transition Care Management Follow-up Telephone Call Date of Discharge: 02/24/24 Discharge Facility: [facility]) Type of Discharge: Inpatient Admission Primary Inpatient Discharge Diagnosis:: Non-healing open wound of right heel How have you been since you were released from the hospital?: Better Any questions or concerns?: No  Items Reviewed: Did you receive and understand the discharge instructions provided?: Yes Medications obtained,verified, and reconciled?: Yes (Medications Reviewed) Any new allergies since your discharge?: No Dietary orders reviewed?: Yes Type of Diet Ordered:: Carb Modified Do you have support at home?: Yes People in Home [RPT]: child(ren), adult Name of Support/Comfort Primary Source: [name]  Medications Reviewed Today: Medications Reviewed Today     Reviewed by Kennethia Lynes, RN (Case Manager) on 02/27/24 at 1148  Med List Status: <None>   Medication Order Taking? Sig Documenting Provider Last Dose Status Informant  Accu-Chek Softclix Lancets lancets [id number] Yes Use as instructed to monitor glucose 4 times daily- use as backup to Othelia Therisa Benton JINNY, NP  Active Pharmacy Records, Child  acetaminophen  (TYLENOL ) 500 MG tablet [id number] Yes Take 1,000 mg by mouth as needed for mild pain (pain score 1-3) or moderate pain (pain score 4-6). [provider]  Active Child, Pharmacy Records  albuterol  (VENTOLIN  HFA) 108 [phone] Base) MCG/ACT inhaler [id number] Yes Inhale 2 puffs into the lungs every 6 (six) hours as needed for wheezing. Shortness of breath Elnor Lauraine BRAVO, NP  Active Pharmacy Records, Child  busPIRone  (BUSPAR ) 5 MG tablet [id number] Yes Take 5 mg by mouth 2 (two) times daily. [provider]  Active Pharmacy Records, Child  Cholecalciferol  1.25 MG (50000 UT) capsule [id number] Yes Take 50,000 Units by mouth once a week. On Sunday [provider]  Active Pharmacy Records, Child  clopidogrel  (PLAVIX ) 75 MG tablet [id number] Yes Take 1 tablet by mouth once daily Okey Vina GAILS, MD  Active Child, Pharmacy Records  EPINEPHrine  0.3 mg/0.3 mL IJ SOAJ injection [id number] Yes Inject 0.3 mg into the muscle as needed for anaphylaxis. Elnor Lauraine BRAVO, NP  Active Pharmacy Records, Child           Med Note EFRAIM, ALFREIDA CROME   Wed Feb 15, 2024  4:15 PM)    glucose blood (ACCU-CHEK GUIDE TEST) test strip [id number] Yes Use as instructed to monitor glucose 4 times daily- use as back up for Dexcom Reardon, Whitney J, NP  Active Pharmacy Records, Child  Ibuprofen 200 MG CAPS [id number] Yes Take 2 capsules by mouth as needed. [provider]  Active Child, Pharmacy Records  Insulin  Pen Needle (B-D ULTRAFINE III SHORT PEN) 31G X 8 MM MISC [id number] Yes USE AS DIRECTED Jaycee Greig JINNY, NP  Active Pharmacy Records, Child  insulin  regular human CONCENTRATED (HUMULIN  R U-500 KWIKPEN) 500 UNIT/ML KwikPen [id number] Yes Inject 60 Units into the skin 3 (three) times daily with meals. Therisa Benton JINNY, NP  Active Child, Pharmacy Records  isosorbide  mononitrate (IMDUR ) 30 MG 24 hr tablet [id number] Yes Take 1 tablet (30 mg total) by mouth daily. Johnson Laymon [name], PA-C  Active Child, Pharmacy Records  levocetirizine (XYZAL ) 5 MG tablet [id number] Yes Take 5 mg by mouth daily. [provider]  Active Pharmacy Records, Child  levofloxacin  (LEVAQUIN ) 750 MG tablet [id number] Yes Take  1 tablet (750 mg total) by mouth daily for 20 days. Al-Sultani, Anmar, MD  Active   linezolid  (ZYVOX ) 600 MG tablet [id number] Yes Take 1 tablet (600 mg total) by mouth 2 (two) times daily for 8 days. Al-Sultani, Anmar, MD  Active   Melatonin 10 MG TABS [id number] Yes Take 20 mg by mouth at bedtime as  needed. [provider]  Active Child, Pharmacy Records  metoprolol  tartrate (LOPRESSOR ) 25 MG tablet [id number] Yes Take 0.5 tablets (12.5 mg total) by mouth 2 (two) times daily. Johnson Laymon HERO, PA-C  Active Child, Pharmacy Records  nitroGLYCERIN  (NITROSTAT ) 0.4 MG SL tablet [id number] Yes DISSOLVE ONE TABLET UNDER THE TONGUE EVERY 5 MINUTES AS NEEDED FOR CHEST PAIN.  DO NOT EXCEED A TOTAL OF 3 DOSES IN 15 MINUTES Okey Vina GAILS, MD  Active Pharmacy Records, Child  nystatin  powder [id number]  Apply 1 Application topically 3 (three) times daily.  Patient not taking: Reported on 02/27/2024   Geroldine Berg, MD  Active Pharmacy Records, Child  oxyCODONE  (OXY IR/ROXICODONE ) 5 MG immediate release tablet [id number] Yes Take 1 tablet (5 mg total) by mouth every 6 (six) hours as needed for up to 5 days for moderate pain (pain score 4-6) or severe pain (pain score 7-10). Al-Sultani, Anmar, MD  Active   pantoprazole  (PROTONIX ) 40 MG tablet [id number] Yes Take 1 tablet (40 mg total) by mouth daily. Elnor Lauraine BRAVO, NP  Active Pharmacy Records, Child  rosuvastatin  (CRESTOR ) 40 MG tablet [id number] Yes Take 40 mg by mouth at bedtime. [provider]  Active Pharmacy Records, Child  senna-docusate (SENOKOT-S) 8.6-50 MG tablet [id number] Yes Take 1-2 tablets by mouth 2 (two) times daily between meals as needed for mild constipation or moderate constipation. Gonfa, Taye T, MD  Active Child, Pharmacy Records  sulfamethoxazole -trimethoprim  (BACTRIM  DS) 800-160 MG tablet [id number] Yes Take 1 tablet by mouth 2 (two) times daily for 12 days. Start after linezolid  complete Al-Sultani, Anmar, MD  Active   torsemide  (DEMADEX ) 20 MG tablet [id number] Yes Take 2 tablets by mouth once daily Okey Vina GAILS, MD  Active Pharmacy Records, Child  VRAYLAR  3 MG capsule [id number] Yes Take 3 mg by mouth daily.  Patient taking differently: Take 1.5 mg by mouth daily.   [provider]  Active Pharmacy Records, Child             Home Care and Equipment/Supplies: Were Home Health Services Ordered?: Yes Name of Home Health Agency:: [facility]CM called to [facility].  Sart of care for today.  Agency will be contacting family Has Agency set up a time to come to your home?: No Any new equipment or medical supplies ordered?: No  Functional Questionnaire: Do you need assistance with bathing/showering or dressing?: Yes (daughter's assist) Do you need assistance with meal preparation?: Yes (daughter's assist) Do you need assistance with eating?: No Do you have difficulty maintaining continence: No Do you need assistance with getting out of bed/getting out of a chair/moving?: Yes (daughter's assist) Do you have difficulty managing or taking your medications?: Yes (daughter's handle medications)  Follow up appointments reviewed: PCP Follow-up appointment confirmed?: No (daughter to call) MD Provider Line [phone] Given: No Specialist Hospital Follow-up appointment confirmed?: Yes Date of Specialist follow-up appointment?: 03/15/24 Follow-Up Specialty Provider:: Dr. Malvin Do you need transportation to your follow-up appointment?: No Do you understand care options if your condition(s) worsen?: Yes-patient verbalized understanding  SDOH Interventions Today    Flowsheet Row Most Recent Value  SDOH Interventions  Food Insecurity Interventions Intervention Not Indicated  Housing Interventions Intervention Not Indicated  Transportation Interventions Intervention Not Indicated  Utilities Interventions Intervention Not Indicated     Goals Addressed             This Visit's Progress    VBCI Transitions of Care (TOC) Care Plan       Problems:  Recent Hospitalization for treatment of DMII and Right Heel Infection Knowledge Deficit Related to Right heel wound and   Goal:  Over the next 30 days, the patient will not experience hospital readmission  Interventions:  Transitions of  Care: 02/27/24 Spoke with daughter Nicole [name]. She states patient is doing okay but patient less mobile due to right heel wound.  Hydro silver dressing being done daily by daughter Shanda.  She states they had not heard from home health.  Advised that CM would contact [facility].  She verbalized understanding.  [name] reports blood sugars in the low 100's sine being home.  Follow up with podiatry 03/15/24.   Doctor Visits  - discussed the importance of doctor visits Communication with Home Health re: regarding start of care for wound vac.  Spoke with Alesia who states orders were needing to be completed-now complete and forwarded to the assigned nurse.  She states that nurse would be going today.   Post discharge activity limitations prescribed by provider reviewed Post-op wound/incision care reviewed with patient/caregiver Reviewed Signs and symptoms of infection     Diabetes Interventions: Assessed patient's understanding of A1c goal: <8% Reviewed medications with patient and discussed importance of medication adherence Discussed plans with patient for ongoing care management follow up and provided patient with direct contact information for care management team Reviewed scheduled/upcoming provider appointments including: Podiatry Assessed social determinant of health barriers Lab Results  Component Value Date   HGBA1C 9.2 (H) 02/17/2024   Diabetes Management Discussed:  Medication adherence Discussed importance of limiting carbs such as rice, potatoes, breads, and pastas.  Discussed importance of sugar control to promote wound healing.  Wound Management Discussed: Discussed: Monitor for swelling, redness, pain, pus, and fever, Keep wound clean and dry, Notify physician immediately for any changes.  Off load pressure to wound areas as much as possible.    Patient Self Care Activities:  Attend all scheduled provider appointments Call pharmacy for medication refills 3-7 days in advance of running  out of medications Call provider office for new concerns or questions  Notify RN Care Manager of TOC call rescheduling needs Participate in Transition of Care Program/Attend TOC scheduled calls Take medications as prescribed   Monitor for swelling, redness, pain, pus, and fever Keep wound clean and dry.   Notify physician immediately for any changes. Monitor sugars as ordered by physician Limit carbohydrates, sweets and sugary drinks.  Plan:  The patient has been provided with contact information for the care management team and has been advised to call with any health related questions or concerns.         Discussed and offered 30 day TOC program.  Patient /caregiver agreeable.  The patient/caregiver has been provided with contact information for the care management team and has been advised to call with any health -related questions or concerns.  The patient/caregiver verbalized understanding with current plan of care.  The patient/caregiver is directed to their insurance card regarding availability of benefits coverage.     Aniaya Bacha J. Royalti Schauf RN, MSN [facility], [facility] Health RN Care Manager Direct Dial: [phone]  Fax: [phone] Website:  [email or web address]   "

## 2024-02-27 NOTE — Patient Instructions (Signed)
 Visit Information  Thank you for taking time to visit with me today. Please don't hesitate to contact me if I can be of assistance to you before our next scheduled telephone appointment.  Our next appointment is by telephone on 03/07/24 at 1000 am  Following is a copy of your care plan:   Goals Addressed             This Visit's Progress    VBCI Transitions of Care (TOC) Care Plan       Problems:  Recent Hospitalization for treatment of DMII and Right Heel Infection Knowledge Deficit Related to Right heel wound and   Goal:  Over the next 30 days, the patient will not experience hospital readmission  Interventions:  Transitions of Care: 02/27/24 Spoke with daughter Ernesta. She states patient is doing okay but patient less mobile due to right heel wound.  Hydro silver dressing being done daily by daughter Shanda.  She states they had not heard from home health.  Advised that CM would contact [facility].  She verbalized understanding.  [name] reports blood sugars in the low 100's sine being home.  Follow up with podiatry 03/15/24.   Doctor Visits  - discussed the importance of doctor visits Communication with Home Health re: regarding start of care for wound vac.  Spoke with Alesia who states orders were needing to be completed-now complete and forwarded to the assigned nurse.  She states that nurse would be going today.   Post discharge activity limitations prescribed by provider reviewed Post-op wound/incision care reviewed with patient/caregiver Reviewed Signs and symptoms of infection     Diabetes Interventions: Assessed patient's understanding of A1c goal: <8% Reviewed medications with patient and discussed importance of medication adherence Discussed plans with patient for ongoing care management follow up and provided patient with direct contact information for care management team Reviewed scheduled/upcoming provider appointments including: Podiatry Assessed social determinant of  health barriers Lab Results  Component Value Date   HGBA1C 9.2 (H) 02/17/2024   Diabetes Management Discussed:  Medication adherence Discussed importance of limiting carbs such as rice, potatoes, breads, and pastas.  Discussed importance of sugar control to promote wound healing.  Wound Management Discussed: Discussed: Monitor for swelling, redness, pain, pus, and fever, Keep wound clean and dry, Notify physician immediately for any changes.  Off load pressure to wound areas as much as possible.    Patient Self Care Activities:  Attend all scheduled provider appointments Call pharmacy for medication refills 3-7 days in advance of running out of medications Call provider office for new concerns or questions  Notify RN Care Manager of TOC call rescheduling needs Participate in Transition of Care Program/Attend TOC scheduled calls Take medications as prescribed   Monitor for swelling, redness, pain, pus, and fever Keep wound clean and dry.   Notify physician immediately for any changes. Monitor sugars as ordered by physician Limit carbohydrates, sweets and sugary drinks.  Plan:  The patient has been provided with contact information for the care management team and has been advised to call with any health related questions or concerns.         Patient verbalizes understanding of instructions and care plan provided today and agrees to view in MyChart. Active MyChart status and patient understanding of how to access instructions and care plan via MyChart confirmed with patient.     The patient has been provided with contact information for the care management team and has been advised to call with any health related  questions or concerns.   Please call the care guide team at [phone] if you need to cancel or reschedule your appointment.   Please call the Suicide and Crisis Lifeline: 988 call the [place]  National Suicide Prevention Lifeline: 5747299144 or TTY: 3024342526 TTY  774-604-7318) to talk to a trained counselor if you are experiencing a Mental Health or Behavioral Health Crisis or need someone to talk to.  Krimson Massmann J. Danyela Posas RN, MSN [facility], [facility] Health RN Care Manager Direct Dial: [phone]  Fax: [phone] Website: [email or web address]

## 2024-02-28 ENCOUNTER — Encounter: Payer: Self-pay | Admitting: *Deleted

## 2024-02-28 NOTE — Progress Notes (Signed)
 Nicole [name]                                          MRN: [id number]   02/28/2024   The VBCI Quality Team Specialist reviewed this patient medical record for the purposes of chart review for care gap closure. The following were reviewed: chart review for care gap closure-glycemic status assessment.    VBCI Quality Team

## 2024-02-29 DIAGNOSIS — I739 Peripheral vascular disease, unspecified: Secondary | ICD-10-CM | POA: Insufficient documentation

## 2024-03-01 ENCOUNTER — Ambulatory Visit: Admitting: Podiatry

## 2024-03-07 ENCOUNTER — Other Ambulatory Visit: Payer: Self-pay

## 2024-03-07 NOTE — Patient Instructions (Signed)
 Visit Information  Thank you for taking time to visit with me today. Please don't hesitate to contact me if I can be of assistance to you before our next scheduled telephone appointment.  Our next appointment is by telephone on 03/15/23 at 1000 am  Following is a copy of your care plan:   Goals Addressed             This Visit's Progress    VBCI Transitions of Care (TOC) Care Plan       Problems:  Recent Hospitalization for treatment of DMII and Right Heel Infection Knowledge Deficit Related to Right heel wound and   Goal:  Over the next 30 days, the patient will not experience hospital readmission  Interventions:  Transitions of Care: 03/07/24 Spoke with daughter [name]. She states patient is doing okay but having some diarrhea.  Patient has been on antibiotics since discharge.  Currently only taking the Levaquin  now.  She reports she did call the PCP and waiting for return call.  Discussed hydration during times of diarrhea.  Home health is coming 3/week for wound vac changes.  [name] states nurse says the wound looks good.  [name] reports blood sugars continue to be  in the low 100's.  Hospital follow up with podiatry 03/15/24. Hospital follow up with PCP 03/19/24.  Doctor Visits  - discussed the importance of doctor visits Reviewed Signs and symptoms of infection     Diabetes Interventions: Assessed patient's understanding of A1c goal: <8% Reviewed medications with patient and discussed importance of medication adherence Discussed plans with patient for ongoing care management follow up and provided patient with direct contact information for care management team Reviewed scheduled/upcoming provider appointments including: Podiatry Assessed social determinant of health barriers Lab Results  Component Value Date   HGBA1C 9.2 (H) 02/17/2024   Diabetes Management Discussed:  Medication adherence Discussed importance of limiting carbs such as rice, potatoes, breads, and pastas.   Discussed importance of sugar control to promote wound healing.  Wound Management Discussed: Reiterated: Monitor for swelling, redness, pain, pus, and fever, Keep wound clean and dry, Notify physician immediately for any changes.  Off load pressure to wound areas as much as possible.    Patient Self Care Activities:  Attend all scheduled provider appointments Call pharmacy for medication refills 3-7 days in advance of running out of medications Call provider office for new concerns or questions  Notify RN Care Manager of TOC call rescheduling needs Participate in Transition of Care Program/Attend TOC scheduled calls Take medications as prescribed   Monitor for swelling, redness, pain, pus, and fever Keep wound clean and dry.   Notify physician immediately for any changes. Monitor sugars as ordered by physician Limit carbohydrates, sweets and sugary drinks.  Plan:  The patient has been provided with contact information for the care management team and has been advised to call with any health related questions or concerns.         Patient verbalizes understanding of instructions and care plan provided today and agrees to view in MyChart. Active MyChart status and patient understanding of how to access instructions and care plan via MyChart confirmed with patient.     The patient has been provided with contact information for the care management team and has been advised to call with any health related questions or concerns.   Please call the care guide team at [phone] if you need to cancel or reschedule your appointment.   Please call the Suicide and Crisis Lifeline:  988 call the [place]  National Suicide Prevention Lifeline: (231)382-8273 or TTY: 567-164-2424 TTY 603-043-1684) to talk to a trained counselor if you are experiencing a Mental Health or Behavioral Health Crisis or need someone to talk to.  Antonela Freiman J. Shanikqua Zarzycki RN, MSN [facility], [facility]  Health RN Care Manager Direct Dial: [phone]  Fax: [phone] Website: [email or web address]

## 2024-03-07 NOTE — Transitions of Care (Post Inpatient/ED Visit) (Signed)
 " Transition of Care week 2  Visit Note  03/07/2024  Name: Nicole [name] MRN: [id number]          DOB: [birth year]  Situation: Patient enrolled in [facility] 30-day program. Visit completed with daughter [name] by telephone.   Background:   Initial Transition Care Management Follow-up Telephone Call Discharge Date and Diagnosis: 02/24/24, Non-healing open wound of right heel   Past Medical History:  Diagnosis Date   CAP (community acquired pneumonia)    Streptococcus 01/2011   COPD (chronic obstructive pulmonary disease) (HCC)    Coronary atherosclerosis of native coronary artery    a. Diagnosed [place]  2006 - DES RCA, reports followup cath 2009 at [facility] b. reported 2 stents in [place]  in 2020. c. 07/2021: cath showing patent stents along RCA and mid-LAD with scattered 20% stenosis but no obstructive disease.   Dyslipidemia    Essential hypertension, benign    Morbid obesity (HCC)    Pancreatitis    Type 2 diabetes mellitus (HCC)     Assessment: Patient Reported Symptoms: Cognitive Cognitive Status: Alert and oriented to person, place, and time, Normal speech and language skills      Neurological Neurological Review of Symptoms: No symptoms reported    HEENT HEENT Symptoms Reported: No symptoms reported      Cardiovascular Cardiovascular Symptoms Reported: No symptoms reported    Respiratory Respiratory Symptoms Reported: No symptoms reported    Endocrine Endocrine Symptoms Reported: No symptoms reported Is patient diabetic?: Yes Is patient checking blood sugars at home?: Yes List most recent blood sugar readings, include date and time of day: Blood sugars in the low 100's Endocrine Self-Management Outcome: 4 (good)  Gastrointestinal Gastrointestinal Symptoms Reported: No symptoms reported, Diarrhea Additional Gastrointestinal Details: Having diarrhea possible due to antibiotics.  Daughter has called PCP and wating for call back.  Encouraged hydration Gastrointestinal  Management Strategies: Fluid modification Gastrointestinal Self-Management Outcome: 3 (uncertain)    Genitourinary Genitourinary Symptoms Reported: No symptoms reported    Integumentary Integumentary Symptoms Reported: Wound Additional Integumentary Details: Right heel wound.  Currently with wound vac.  Home heath visiting 3 week for wound care. Skin Management Strategies: Medication therapy, Medical device, Routine screening Skin Self-Management Outcome: 3 (uncertain)  Musculoskeletal Musculoskelatal Symptoms Reviewed: Weakness, Limited mobility, Unsteady gait Additional Musculoskeletal Details: Limitied mobility due to wound vac. Musculoskeletal Management Strategies: Medical device Musculoskeletal Comment: has walker, wheelchair and [facility]      Psychosocial Psychosocial Symptoms Reported: Not assessed Additional Psychological Details: daughter [name] completes the assessment         There were no vitals filed for this visit.    Medications Reviewed Today     Reviewed by Lothar Prehn, RN (Case Manager) on 03/07/24 at 1011  Med List Status: <None>   Medication Order Taking? Sig Documenting Provider Last Dose Status Informant  Accu-Chek Softclix Lancets lancets [id number] Yes Use as instructed to monitor glucose 4 times daily- use as backup to Othelia Therisa Benton JINNY, NP  Active Pharmacy Records, Child  acetaminophen  (TYLENOL ) 500 MG tablet [id number] Yes Take 1,000 mg by mouth as needed for mild pain (pain score 1-3) or moderate pain (pain score 4-6). [provider]  Active Child, Pharmacy Records  albuterol  (VENTOLIN  HFA) 108 (90 Base) MCG/ACT inhaler [id number] Yes Inhale 2 puffs into the lungs every 6 (six) hours as needed for wheezing. Shortness of breath Elnor Lauraine BRAVO, NP  Active Pharmacy Records, Child  busPIRone  (BUSPAR ) 5 MG tablet [id number] Yes Take 5 mg by mouth 2 (  two) times daily. [provider]  Active Pharmacy Records, Child  Cholecalciferol  1.25 MG  (50000 UT) capsule [id number] Yes Take 50,000 Units by mouth once a week. On Sunday [provider]  Active Pharmacy Records, Child  clopidogrel  (PLAVIX ) 75 MG tablet [id number] Yes Take 1 tablet by mouth once daily Okey Vina GAILS, MD  Active Child, Pharmacy Records  EPINEPHrine  0.3 mg/0.3 mL IJ SOAJ injection [id number] Yes Inject 0.3 mg into the muscle as needed for anaphylaxis. Elnor Lauraine BRAVO, NP  Active Pharmacy Records, Child           Med Note EFRAIM, ALFREIDA CROME   Wed Feb 15, 2024  4:15 PM)    glucose blood (ACCU-CHEK GUIDE TEST) test strip [id number] Yes Use as instructed to monitor glucose 4 times daily- use as back up for Dexcom Reardon, Whitney J, NP  Active Pharmacy Records, Child  Ibuprofen 200 MG CAPS [id number] Yes Take 2 capsules by mouth as needed. [provider]  Active Child, Pharmacy Records  Insulin  Pen Needle (B-D ULTRAFINE III SHORT PEN) 31G X 8 MM MISC [id number] Yes USE AS DIRECTED Jaycee Greig PARAS, NP  Active Pharmacy Records, Child  insulin  regular human CONCENTRATED (HUMULIN  R U-500 KWIKPEN) 500 UNIT/ML KwikPen [id number] Yes Inject 60 Units into the skin 3 (three) times daily with meals. Therisa Benton PARAS, NP  Active Child, Pharmacy Records  isosorbide  mononitrate (IMDUR ) 30 MG 24 hr tablet [id number]  Take 1 tablet (30 mg total) by mouth daily. Johnson Laymon HERO, PA-C  Expired 02/27/24 2359 Child, Pharmacy Records  levocetirizine (XYZAL ) 5 MG tablet [id number]  Take 5 mg by mouth daily. [provider]  Active Pharmacy Records, Child  levofloxacin  (LEVAQUIN ) 750 MG tablet [id number] Yes Take 1 tablet (750 mg total) by mouth daily for 20 days. Al-Sultani, Anmar, MD  Active   Melatonin 10 MG TABS [id number] Yes Take 20 mg by mouth at bedtime as needed. [provider]  Active Child, Pharmacy Records  metoprolol  tartrate (LOPRESSOR ) 25 MG tablet [id number] Yes Take 0.5 tablets (12.5 mg total) by mouth 2 (two) times daily. Johnson Laymon HERO, PA-C   Active Child, Pharmacy Records  nitroGLYCERIN  (NITROSTAT ) 0.4 MG SL tablet [id number] Yes DISSOLVE ONE TABLET UNDER THE TONGUE EVERY 5 MINUTES AS NEEDED FOR CHEST PAIN.  DO NOT EXCEED A TOTAL OF 3 DOSES IN 15 MINUTES Okey Vina GAILS, MD  Active Pharmacy Records, Child  nystatin  powder [id number]  Apply 1 Application topically 3 (three) times daily.  Patient not taking: Reported on 03/07/2024   Geroldine Berg, MD  Active Pharmacy Records, Child  pantoprazole  (PROTONIX ) 40 MG tablet [id number] Yes Take 1 tablet (40 mg total) by mouth daily. Elnor Lauraine BRAVO, NP  Active Pharmacy Records, Child  rosuvastatin  (CRESTOR ) 40 MG tablet [id number] Yes Take 40 mg by mouth at bedtime. [provider]  Active Pharmacy Records, Child  senna-docusate (SENOKOT-S) 8.6-50 MG tablet [id number] Yes Take 1-2 tablets by mouth 2 (two) times daily between meals as needed for mild constipation or moderate constipation. Gonfa, Taye T, MD  Active Child, Pharmacy Records  sulfamethoxazole -trimethoprim  (BACTRIM  DS) 800-160 MG tablet [id number]  Take 1 tablet by mouth 2 (two) times daily for 12 days. Start after linezolid  complete  Patient not taking: Reported on 03/07/2024   Al-Sultani, Anmar, MD  Active   torsemide  (DEMADEX ) 20 MG tablet [id number]  Take 2 tablets by mouth once daily Okey Vina GAILS, MD  Active Pharmacy Records, Child  VRAYLAR  3 MG capsule [id number]  Yes Take 3 mg by mouth daily.  Patient taking differently: Take 1.5 mg by mouth daily.   [provider]  Active Pharmacy Records, Child            Goals Addressed             This Visit's Progress    VBCI Transitions of Care (TOC) Care Plan       Problems:  Recent Hospitalization for treatment of DMII and Right Heel Infection Knowledge Deficit Related to Right heel wound and   Goal:  Over the next 30 days, the patient will not experience hospital readmission  Interventions:  Transitions of Care: 03/07/24 Spoke with daughter [name]. She states  patient is doing okay but having some diarrhea.  Patient has been on antibiotics since discharge.  Currently only taking the Levaquin  now.  She reports she did call the PCP and waiting for return call.  Discussed hydration during times of diarrhea.  Home health is coming 3/week for wound vac changes.  [name] states nurse says the wound looks good.  [name] reports blood sugars continue to be  in the low 100's.  Hospital follow up with podiatry 03/15/24. Hospital follow up with PCP 03/19/24.  Doctor Visits  - discussed the importance of doctor visits Reviewed Signs and symptoms of infection     Diabetes Interventions: Assessed patient's understanding of A1c goal: <8% Reviewed medications with patient and discussed importance of medication adherence Discussed plans with patient for ongoing care management follow up and provided patient with direct contact information for care management team Reviewed scheduled/upcoming provider appointments including: Podiatry Assessed social determinant of health barriers Lab Results  Component Value Date   HGBA1C 9.2 (H) 02/17/2024   Diabetes Management Discussed:  Medication adherence Discussed importance of limiting carbs such as rice, potatoes, breads, and pastas.  Discussed importance of sugar control to promote wound healing.  Wound Management Discussed: Reiterated: Monitor for swelling, redness, pain, pus, and fever, Keep wound clean and dry, Notify physician immediately for any changes.  Off load pressure to wound areas as much as possible.    Patient Self Care Activities:  Attend all scheduled provider appointments Call pharmacy for medication refills 3-7 days in advance of running out of medications Call provider office for new concerns or questions  Notify RN Care Manager of TOC call rescheduling needs Participate in Transition of Care Program/Attend TOC scheduled calls Take medications as prescribed   Monitor for swelling, redness, pain, pus, and  fever Keep wound clean and dry.   Notify physician immediately for any changes. Monitor sugars as ordered by physician Limit carbohydrates, sweets and sugary drinks.  Plan:  The patient has been provided with contact information for the care management team and has been advised to call with any health related questions or concerns.         Recommendation:   Continue Current Plan of Care  Follow Up Plan:   Telephone follow-up in 1 week  Riki Berninger J. Brianah Hopson RN, MSN [facility], [facility] RN Care Manager Direct Dial: [phone]  Fax: [phone] Website: [email or web address]      "

## 2024-03-10 ENCOUNTER — Other Ambulatory Visit: Payer: Self-pay | Admitting: Internal Medicine

## 2024-03-14 ENCOUNTER — Other Ambulatory Visit: Payer: Self-pay

## 2024-03-14 ENCOUNTER — Encounter: Payer: Self-pay | Admitting: Podiatry

## 2024-03-14 ENCOUNTER — Other Ambulatory Visit: Payer: Self-pay | Admitting: Podiatry

## 2024-03-14 MED ORDER — OXYCODONE-ACETAMINOPHEN 5-325 MG PO TABS: 1.0000 | ORAL_TABLET | ORAL | 0 refills | Status: AC | PRN

## 2024-03-14 NOTE — Patient Instructions (Signed)
 Visit Information  Thank you for taking time to visit with me today. Please don't hesitate to contact me if I can be of assistance to you before our next scheduled telephone appointment.  Our next appointment is by telephone on 03/21/24 at 1000 am  Following is a copy of your care plan:   Goals Addressed             This Visit's Progress    VBCI Transitions of Care (TOC) Care Plan       Problems:  Recent Hospitalization for treatment of DMII and Right Heel Infection Knowledge Deficit Related to Right heel wound infection   Goal:  Over the next 30 days, the patient will not experience hospital readmission  Interventions:  Transitions of Care: 03/14/24 Spoke with daughter [name]. She states patient is doing okay.  She states they have a stomach bug but patient has no symptoms. She states that home health reports right foot is doing well and healing from the inside.  She continues with levaquin .  Podiatry visit scheduled for 1-22.  PCP 1-26 but may need to be rescheduled due to pending weather,  Blood sugars under 200.   Doctor Visits  - discussed the importance of doctor visits Reviewed Signs and symptoms of infection     Diabetes Interventions: Assessed patient's understanding of A1c goal: <8% Reviewed medications with patient and discussed importance of medication adherence Discussed plans with patient for ongoing care management follow up and provided patient with direct contact information for care management team Reviewed scheduled/upcoming provider appointments including: Podiatry Assessed social determinant of health barriers Lab Results  Component Value Date   HGBA1C 9.2 (H) 02/17/2024   Diabetes Management Discussed:  Medication adherence Discussed importance of limiting carbs such as rice, potatoes, breads, and pastas.  Discussed importance of sugar control to promote wound healing.  Wound Management Discussed: Reviewed: Monitor for swelling, redness, pain, pus, and  fever, Keep wound clean and dry, Notify physician immediately for any changes.  Off load pressure to wound areas as much as possible.    Patient Self Care Activities:  Attend all scheduled provider appointments Call pharmacy for medication refills 3-7 days in advance of running out of medications Call provider office for new concerns or questions  Notify RN Care Manager of TOC call rescheduling needs Participate in Transition of Care Program/Attend TOC scheduled calls Take medications as prescribed   Monitor for swelling, redness, pain, pus, and fever Keep wound clean and dry.   Notify physician immediately for any changes. Monitor sugars as ordered by physician Limit carbohydrates, sweets and sugary drinks.  Plan:  The patient has been provided with contact information for the care management team and has been advised to call with any health related questions or concerns.         Patient verbalizes understanding of instructions and care plan provided today and agrees to view in MyChart. Active MyChart status and patient understanding of how to access instructions and care plan via MyChart confirmed with patient.     The patient has been provided with contact information for the care management team and has been advised to call with any health related questions or concerns.   Please call the care guide team at [phone] if you need to cancel or reschedule your appointment.   Please call the Suicide and Crisis Lifeline: 988 if you are experiencing a Mental Health or Behavioral Health Crisis or need someone to talk to.  Cristo Ausburn J. Deandra Gadson RN, MSN [facility]  [facility], [facility] RN Care Manager Direct Dial: [phone]  Fax: [phone] Website: [email or web address]

## 2024-03-14 NOTE — Transitions of Care (Post Inpatient/ED Visit) (Signed)
 " Transition of Care week 3  Visit Note  03/14/2024  Name: Nicole [name] MRN: [id number]          DOB: December 02, 1965  Situation: Patient enrolled in [facility] 30-day program. Visit completed with daughter [name] by telephone.   Background:   Initial Transition Care Management Follow-up Telephone Call Discharge Date and Diagnosis: 02/24/24, Non-healing open wound of right heel   Past Medical History:  Diagnosis Date   CAP (community acquired pneumonia)    Streptococcus 01/2011   COPD (chronic obstructive pulmonary disease) (HCC)    Coronary atherosclerosis of native coronary artery    a. Diagnosed [place]  2006 - DES RCA, reports followup cath 2009 at [facility] b. reported 2 stents in [place]  in 2020. c. 07/2021: cath showing patent stents along RCA and mid-LAD with scattered 20% stenosis but no obstructive disease.   Dyslipidemia    Essential hypertension, benign    Morbid obesity (HCC)    Pancreatitis    Type 2 diabetes mellitus (HCC)     Assessment: Patient Reported Symptoms: Cognitive Cognitive Status: Alert and oriented to person, place, and time, Normal speech and language skills      Neurological Neurological Review of Symptoms: No symptoms reported    HEENT HEENT Symptoms Reported: No symptoms reported      Cardiovascular Cardiovascular Symptoms Reported: No symptoms reported    Respiratory Respiratory Symptoms Reported: No symptoms reported    Endocrine Endocrine Symptoms Reported: No symptoms reported Is patient diabetic?: Yes Is patient checking blood sugars at home?: Yes List most recent blood sugar readings, include date and time of day: [name] reports sugars under 200.  Reviewed diabetes management Endocrine Self-Management Outcome: 4 (good)  Gastrointestinal Gastrointestinal Symptoms Reported: No symptoms reported      Genitourinary Genitourinary Symptoms Reported: No symptoms reported    Integumentary Integumentary Symptoms Reported: Wound Additional  Integumentary Details: Right heel wound. Currently with wound vac. Home heath visiting 3 week for wound care.  Podiatry visit on 03/15/24 Skin Management Strategies: Medication therapy, Medical device, Routine screening Skin Self-Management Outcome: 3 (uncertain)  Musculoskeletal Musculoskelatal Symptoms Reviewed: Limited mobility, Unsteady gait, Weakness Additional Musculoskeletal Details: Limited mobility due to wound vac.  Stands and pivots for transfers Musculoskeletal Management Strategies: Medical device Musculoskeletal Self-Management Outcome: 3 (uncertain)      Psychosocial Psychosocial Symptoms Reported: Not assessed Additional Psychological Details: daughter completes the assessment         There were no vitals filed for this visit.    Medications Reviewed Today     Reviewed by Dak Szumski, RN (Case Manager) on 03/14/24 at 1007  Med List Status: <None>   Medication Order Taking? Sig Documenting Provider Last Dose Status Informant  Accu-Chek Softclix Lancets lancets [id number] Yes Use as instructed to monitor glucose 4 times daily- use as backup to Othelia Therisa Benton JINNY, NP  Active Pharmacy Records, Child  acetaminophen  (TYLENOL ) 500 MG tablet [id number] Yes Take 1,000 mg by mouth as needed for mild pain (pain score 1-3) or moderate pain (pain score 4-6). [provider]  Active Child, Pharmacy Records  albuterol  (VENTOLIN  HFA) [phone] Base) MCG/ACT inhaler [id number] Yes Inhale 2 puffs into the lungs every 6 (six) hours as needed for wheezing. Shortness of breath Elnor Lauraine BRAVO, NP  Active Pharmacy Records, Child  busPIRone  (BUSPAR ) 5 MG tablet [id number] Yes Take 5 mg by mouth 2 (two) times daily. [provider]  Active Pharmacy Records, Child  Cholecalciferol  1.25 MG (50000 UT) capsule [id number] Yes Take 50,000  Units by mouth once a week. On Sunday [provider]  Active Pharmacy Records, Child  clopidogrel  (PLAVIX ) 75 MG tablet [id number] Yes Take  1 tablet by mouth once daily Okey Vina GAILS, MD  Active Child, Pharmacy Records  EPINEPHrine  0.3 mg/0.3 mL IJ SOAJ injection [id number] Yes Inject 0.3 mg into the muscle as needed for anaphylaxis. Elnor Lauraine BRAVO, NP  Active Pharmacy Records, Child           Med Note EFRAIM, ALFREIDA CROME   Wed Feb 15, 2024  4:15 PM)    glucose blood (ACCU-CHEK GUIDE TEST) test strip [id number] Yes Use as instructed to monitor glucose 4 times daily- use as back up for Dexcom Reardon, Whitney J, NP  Active Pharmacy Records, Child  Ibuprofen 200 MG CAPS [id number] Yes Take 2 capsules by mouth as needed. [provider]  Active Child, Pharmacy Records  Insulin  Pen Needle (B-D ULTRAFINE III SHORT PEN) 31G X 8 MM MISC [id number] Yes USE AS DIRECTED Jaycee Greig PARAS, NP  Active Pharmacy Records, Child  insulin  regular human CONCENTRATED (HUMULIN  R U-500 KWIKPEN) 500 UNIT/ML KwikPen [id number] Yes Inject 60 Units into the skin 3 (three) times daily with meals. Therisa Benton PARAS, NP  Active Child, Pharmacy Records  isosorbide  mononitrate (IMDUR ) 30 MG 24 hr tablet [id number] Yes Take 1 tablet (30 mg total) by mouth daily. Johnson Laymon HERO, PA-C  Active Child, Pharmacy Records  levocetirizine (XYZAL ) 5 MG tablet [id number] Yes Take 5 mg by mouth daily. [provider]  Active Pharmacy Records, Child  levofloxacin  (LEVAQUIN ) 750 MG tablet [id number] Yes Take 1 tablet (750 mg total) by mouth daily for 20 days. Al-Sultani, Anmar, MD  Active   Melatonin 10 MG TABS [id number] Yes Take 20 mg by mouth at bedtime as needed. [provider]  Active Child, Pharmacy Records  metoprolol  tartrate (LOPRESSOR ) 25 MG tablet [id number] Yes Take 0.5 tablets (12.5 mg total) by mouth 2 (two) times daily. Johnson Laymon HERO, PA-C  Active Child, Pharmacy Records  nitroGLYCERIN  (NITROSTAT ) 0.4 MG SL tablet [id number] Yes DISSOLVE ONE TABLET UNDER THE TONGUE EVERY 5 MINUTES AS NEEDED FOR CHEST PAIN.  DO NOT EXCEED A TOTAL OF 3 DOSES IN  15 MINUTES Okey Vina GAILS, MD  Active Pharmacy Records, Child  nystatin  powder [id number]  Apply 1 Application topically 3 (three) times daily.  Patient not taking: Reported on 03/14/2024   Geroldine Berg, MD  Active Pharmacy Records, Child  pantoprazole  (PROTONIX ) 40 MG tablet [id number] Yes Take 1 tablet (40 mg total) by mouth daily. Elnor Lauraine BRAVO, NP  Active Pharmacy Records, Child  rosuvastatin  (CRESTOR ) 40 MG tablet [id number] Yes Take 40 mg by mouth at bedtime. [provider]  Active Pharmacy Records, Child  senna-docusate (SENOKOT-S) 8.6-50 MG tablet [id number] Yes Take 1-2 tablets by mouth 2 (two) times daily between meals as needed for mild constipation or moderate constipation. Gonfa, Taye T, MD  Active Child, Pharmacy Records  sulfamethoxazole -trimethoprim  (BACTRIM  DS) 800-160 MG tablet [id number]  Take 1 tablet by mouth 2 (two) times daily for 12 days. Start after linezolid  complete  Patient not taking: Reported on 03/14/2024   Al-Sultani, Anmar, MD  Active   torsemide  (DEMADEX ) 20 MG tablet [id number] Yes Take 2 tablets by mouth once daily Okey Vina GAILS, MD  Active   VRAYLAR  3 MG capsule [id number] Yes Take 3 mg by mouth daily.  Patient taking differently: Take 1.5 mg by mouth daily.   [provider]  Active Pharmacy Records,  Child            Goals Addressed             This Visit's Progress    [facility]I Transitions of Care (TOC) Care Plan       Problems:  Recent Hospitalization for treatment of DMII and Right Heel Infection Knowledge Deficit Related to Right heel wound infection   Goal:  Over the next 30 days, the patient will not experience hospital readmission  Interventions:  Transitions of Care: 03/14/24 Spoke with daughter [name]. She states patient is doing okay.  She states they have a stomach bug but patient has no symptoms. She states that home health reports right foot is doing well and healing from the inside.  She continues with levaquin .  Podiatry  visit scheduled for 1-22.  PCP 1-26 but may need to be rescheduled due to pending weather,  Blood sugars under 200.   Doctor Visits  - discussed the importance of doctor visits Reviewed Signs and symptoms of infection     Diabetes Interventions: Assessed patient's understanding of A1c goal: <8% Reviewed medications with patient and discussed importance of medication adherence Discussed plans with patient for ongoing care management follow up and provided patient with direct contact information for care management team Reviewed scheduled/upcoming provider appointments including: Podiatry Assessed social determinant of health barriers Lab Results  Component Value Date   HGBA1C 9.2 (H) 02/17/2024   Diabetes Management Discussed:  Medication adherence Discussed importance of limiting carbs such as rice, potatoes, breads, and pastas.  Discussed importance of sugar control to promote wound healing.  Wound Management Discussed: Reviewed: Monitor for swelling, redness, pain, pus, and fever, Keep wound clean and dry, Notify physician immediately for any changes.  Off load pressure to wound areas as much as possible.    Patient Self Care Activities:  Attend all scheduled provider appointments Call pharmacy for medication refills 3-7 days in advance of running out of medications Call provider office for new concerns or questions  Notify RN Care Manager of TOC call rescheduling needs Participate in Transition of Care Program/Attend TOC scheduled calls Take medications as prescribed   Monitor for swelling, redness, pain, pus, and fever Keep wound clean and dry.   Notify physician immediately for any changes. Monitor sugars as ordered by physician Limit carbohydrates, sweets and sugary drinks.  Plan:  The patient has been provided with contact information for the care management team and has been advised to call with any health related questions or concerns.         Recommendation:    Continue Current Plan of Care  Follow Up Plan:   Telephone follow-up in 1 week   Rakia Frayne J. Evan Osburn RN, MSN [facility], [facility] RN Care Manager Direct Dial: [phone]  Fax: [phone] Website: [email or web address]      "

## 2024-03-15 ENCOUNTER — Encounter: Payer: Self-pay | Admitting: Podiatry

## 2024-03-15 ENCOUNTER — Ambulatory Visit (INDEPENDENT_AMBULATORY_CARE_PROVIDER_SITE_OTHER): Admitting: Podiatry

## 2024-03-15 DIAGNOSIS — E11621 Type 2 diabetes mellitus with foot ulcer: Secondary | ICD-10-CM

## 2024-03-15 DIAGNOSIS — L97412 Non-pressure chronic ulcer of right heel and midfoot with fat layer exposed: Secondary | ICD-10-CM

## 2024-03-15 DIAGNOSIS — E0843 Diabetes mellitus due to underlying condition with diabetic autonomic (poly)neuropathy: Secondary | ICD-10-CM

## 2024-03-15 DIAGNOSIS — Z89512 Acquired absence of left leg below knee: Secondary | ICD-10-CM | POA: Diagnosis not present

## 2024-03-15 NOTE — Progress Notes (Signed)
 "  Subjective:  Patient ID: Nicole [name], female    DOB: 1965-03-28,  MRN: [id number]  Chief Complaint  Patient presents with   Wound Check    Right heel ulcer. 7 pain. Wearing wound vac and pneumatic cast. A1C 9.2.    59 y.o. female presents for follow-up on right heel ulceration.  Patient has been on wound VAC therapy for the right heel ulceration.  Per the home health nurse who is doing the [facility] changes the wound is looking pretty good.  She denies any odor or unsure of significant drainage.  Does have pain with this.  She is wearing a cam boot.  Trying to stay off of it completely.  She basically only transfers from bed to recliner and back  Past Medical History:  Diagnosis Date   CAP (community acquired pneumonia)    Streptococcus 01/2011   COPD (chronic obstructive pulmonary disease) (HCC)    Coronary atherosclerosis of native coronary artery    a. Diagnosed [place]  2006 - DES RCA, reports followup cath 2009 at [facility] b. reported 2 stents in [place]  in 2020. c. 07/2021: cath showing patent stents along RCA and mid-LAD with scattered 20% stenosis but no obstructive disease.   Dyslipidemia    Essential hypertension, benign    Morbid obesity (HCC)    Pancreatitis    Type 2 diabetes mellitus (HCC)     Allergies  Allergen Reactions   Aspirin  Anaphylaxis and Other (See Comments)    Throat closing    Bee Venom Anaphylaxis   Pineapple Anaphylaxis   Definity  [Perflutren  Lipid Microsphere]     Muscle Aches   E-Mycin [Erythromycin Base] Nausea And Vomiting    ROS: Negative except as per HPI above  Objective:  General: AAO x3, NAD  Dermatological: Right heel wound measures approximately 4 x 4.5 cm and about 1/2 cm depth.  There is some granular tissue in the wound bed though not beefy red more of a pink devascularize tissue.  Upon debridement there is healthy bleeding from the wound bed.  The wound does not probe to bone at this time there is some undermining of the margins  especially at the 12-3 o'clock area..  There is scattered fibrotic tissues present in the wound bed.     Vascular: DP and PT pulses nonpalpable, does have cap refill intact to digits  Neruologic: Grossly diminished to light touch to B heel  Musculoskeletal: Prior BKA left lower extremity  Gait: Unassisted, Nonantalgic.    Radiographs:  MRI right foot with and without contrast 11/10/2023: 1. Soft tissue ulceration/wound at the medial heel with surrounding soft tissue edema, enhancement and cutaneous thickening, most pronounced along the anteromedial margin. These findings are compatible with cellulitis. No loculated fluid collection. 2. No evidence of osteomyelitis. Assessment:   1. Diabetic ulcer of right heel associated with type 2 diabetes mellitus, with fat layer exposed (HCC)   2. History of amputation of leg through tibia and fibula, left (HCC)   3. Diabetes mellitus due to underlying condition with diabetic autonomic neuropathy, unspecified whether long term insulin  use (HCC)       Plan:  Patient was evaluated and treated and all questions answered.  # Right heel ulceration secondary to diabetes and PAD - Overall seems to be improving slightly with the wound VAC therapy.  Would not recommend any changes at this time.  Continue with VAC therapy to see how much granular tissue we can get to fill in.  Much of her issue with  healing I think is related to poor and he is only at the location of the wound.  This is irrespective of other flow to the foot. -She is still at very high risk for limb loss on the right side if the wound does not improve as I am concerned about residual/chronic osteomyelitis in the area. - Recommend we continue with VAC therapy.  I placed a silver alginate dressing after light debridement today. -Currently on antibiotics per ID she is coming off this soon.  Will have to monitor how she does off the antibiotics - Weightbearing as tolerated in cam boot for  transfers short distances.  Prevalon boot use while sleeping - follow up in 3 weeks for wound check         [name], DPM [facility] / [facility]                      "

## 2024-03-21 ENCOUNTER — Telehealth: Payer: Self-pay

## 2024-03-26 ENCOUNTER — Other Ambulatory Visit: Payer: Self-pay | Admitting: Internal Medicine

## 2024-03-28 NOTE — Telephone Encounter (Signed)
 Mag completed on 02/15/24 BMP completed on 02/24/24  In accordance with refill protocols, please review and address the following requirements before this medication refill can be authorized:  Vital signs

## 2024-04-03 ENCOUNTER — Ambulatory Visit: Admitting: Nurse Practitioner

## 2024-04-05 ENCOUNTER — Ambulatory Visit: Admitting: Podiatry

## 2024-04-12 ENCOUNTER — Ambulatory Visit: Admitting: Podiatry

## 2024-04-17 ENCOUNTER — Ambulatory Visit: Admitting: Diagnostic Neuroimaging

## 2024-05-02 ENCOUNTER — Ambulatory Visit: Admitting: Urology
